# Patient Record
Sex: Female | Born: 1935 | Race: Black or African American | Hispanic: No | Smoking: Never smoker
Health system: Southern US, Community
[De-identification: ages and names within clinical notes are randomized; demographics above are authoritative.]

## PROBLEM LIST (undated history)

## (undated) DIAGNOSIS — I10 Essential (primary) hypertension: Secondary | ICD-10-CM

## (undated) DIAGNOSIS — Z433 Encounter for attention to colostomy: Secondary | ICD-10-CM

## (undated) DIAGNOSIS — I209 Angina pectoris, unspecified: Secondary | ICD-10-CM

## (undated) DIAGNOSIS — E039 Hypothyroidism, unspecified: Secondary | ICD-10-CM

## (undated) DIAGNOSIS — I442 Atrioventricular block, complete: Secondary | ICD-10-CM

## (undated) DIAGNOSIS — J189 Pneumonia, unspecified organism: Secondary | ICD-10-CM

## (undated) DIAGNOSIS — J45909 Unspecified asthma, uncomplicated: Secondary | ICD-10-CM

## (undated) DIAGNOSIS — J449 Chronic obstructive pulmonary disease, unspecified: Secondary | ICD-10-CM

## (undated) DIAGNOSIS — N289 Disorder of kidney and ureter, unspecified: Secondary | ICD-10-CM

## (undated) DIAGNOSIS — M1990 Unspecified osteoarthritis, unspecified site: Secondary | ICD-10-CM

## (undated) DIAGNOSIS — I2699 Other pulmonary embolism without acute cor pulmonale: Secondary | ICD-10-CM

## (undated) DIAGNOSIS — C189 Malignant neoplasm of colon, unspecified: Secondary | ICD-10-CM

## (undated) DIAGNOSIS — Z95810 Presence of automatic (implantable) cardiac defibrillator: Secondary | ICD-10-CM

## (undated) DIAGNOSIS — R011 Cardiac murmur, unspecified: Secondary | ICD-10-CM

## (undated) DIAGNOSIS — E785 Hyperlipidemia, unspecified: Secondary | ICD-10-CM

## (undated) DIAGNOSIS — E119 Type 2 diabetes mellitus without complications: Secondary | ICD-10-CM

## (undated) DIAGNOSIS — Z9289 Personal history of other medical treatment: Secondary | ICD-10-CM

## (undated) DIAGNOSIS — I509 Heart failure, unspecified: Secondary | ICD-10-CM

## (undated) HISTORY — PX: DILATION AND CURETTAGE OF UTERUS: SHX78

## (undated) HISTORY — DX: Atrioventricular block, complete: I44.2

## (undated) HISTORY — PX: FRACTURE SURGERY: SHX138

## (undated) HISTORY — PX: TUBAL LIGATION: SHX77

## (undated) HISTORY — PX: CATARACT EXTRACTION W/ INTRAOCULAR LENS  IMPLANT, BILATERAL: SHX1307

## (undated) HISTORY — PX: ABDOMINAL HYSTERECTOMY: SHX81

## (undated) HISTORY — PX: ANKLE FRACTURE SURGERY: SHX122

## (undated) HISTORY — PX: CHOLECYSTECTOMY: SHX55

---

## 1998-10-09 ENCOUNTER — Emergency Department (HOSPITAL_COMMUNITY): Admission: EM | Admit: 1998-10-09 | Discharge: 1998-10-09 | Payer: Self-pay | Admitting: Emergency Medicine

## 1999-01-12 ENCOUNTER — Other Ambulatory Visit: Admission: RE | Admit: 1999-01-12 | Discharge: 1999-01-12 | Payer: Self-pay | Admitting: Internal Medicine

## 2000-07-02 ENCOUNTER — Emergency Department (HOSPITAL_COMMUNITY): Admission: EM | Admit: 2000-07-02 | Discharge: 2000-07-02 | Payer: Self-pay | Admitting: Emergency Medicine

## 2000-07-02 ENCOUNTER — Encounter: Payer: Self-pay | Admitting: Emergency Medicine

## 2000-07-10 ENCOUNTER — Emergency Department (HOSPITAL_COMMUNITY): Admission: EM | Admit: 2000-07-10 | Discharge: 2000-07-10 | Payer: Self-pay | Admitting: Emergency Medicine

## 2000-12-03 ENCOUNTER — Emergency Department (HOSPITAL_COMMUNITY): Admission: EM | Admit: 2000-12-03 | Discharge: 2000-12-04 | Payer: Self-pay | Admitting: Emergency Medicine

## 2000-12-04 ENCOUNTER — Encounter: Payer: Self-pay | Admitting: Emergency Medicine

## 2000-12-05 ENCOUNTER — Emergency Department (HOSPITAL_COMMUNITY): Admission: EM | Admit: 2000-12-05 | Discharge: 2000-12-05 | Payer: Self-pay | Admitting: Emergency Medicine

## 2002-01-13 ENCOUNTER — Encounter: Payer: Self-pay | Admitting: Emergency Medicine

## 2002-01-13 ENCOUNTER — Emergency Department (HOSPITAL_COMMUNITY): Admission: EM | Admit: 2002-01-13 | Discharge: 2002-01-13 | Payer: Self-pay | Admitting: Emergency Medicine

## 2002-03-13 ENCOUNTER — Emergency Department (HOSPITAL_COMMUNITY): Admission: EM | Admit: 2002-03-13 | Discharge: 2002-03-13 | Payer: Self-pay | Admitting: Emergency Medicine

## 2002-03-13 ENCOUNTER — Encounter: Payer: Self-pay | Admitting: Emergency Medicine

## 2002-11-06 ENCOUNTER — Emergency Department (HOSPITAL_COMMUNITY): Admission: EM | Admit: 2002-11-06 | Discharge: 2002-11-06 | Payer: Self-pay | Admitting: Emergency Medicine

## 2002-12-14 ENCOUNTER — Encounter: Payer: Self-pay | Admitting: Emergency Medicine

## 2002-12-14 ENCOUNTER — Emergency Department (HOSPITAL_COMMUNITY): Admission: EM | Admit: 2002-12-14 | Discharge: 2002-12-15 | Payer: Self-pay | Admitting: Emergency Medicine

## 2003-12-02 ENCOUNTER — Observation Stay (HOSPITAL_COMMUNITY): Admission: EM | Admit: 2003-12-02 | Discharge: 2003-12-03 | Payer: Self-pay | Admitting: Emergency Medicine

## 2004-07-18 ENCOUNTER — Encounter (INDEPENDENT_AMBULATORY_CARE_PROVIDER_SITE_OTHER): Payer: Self-pay | Admitting: *Deleted

## 2004-07-18 ENCOUNTER — Ambulatory Visit (HOSPITAL_COMMUNITY): Admission: RE | Admit: 2004-07-18 | Discharge: 2004-07-18 | Payer: Self-pay | Admitting: *Deleted

## 2004-08-19 ENCOUNTER — Encounter: Admission: RE | Admit: 2004-08-19 | Discharge: 2004-08-19 | Payer: Self-pay | Admitting: General Surgery

## 2004-08-31 ENCOUNTER — Encounter (INDEPENDENT_AMBULATORY_CARE_PROVIDER_SITE_OTHER): Payer: Self-pay | Admitting: Specialist

## 2004-08-31 ENCOUNTER — Inpatient Hospital Stay (HOSPITAL_COMMUNITY): Admission: RE | Admit: 2004-08-31 | Discharge: 2004-09-01 | Payer: Self-pay | Admitting: General Surgery

## 2004-09-12 ENCOUNTER — Ambulatory Visit: Payer: Self-pay | Admitting: Oncology

## 2004-09-19 ENCOUNTER — Ambulatory Visit (HOSPITAL_COMMUNITY): Admission: RE | Admit: 2004-09-19 | Discharge: 2004-09-19 | Payer: Self-pay | Admitting: General Surgery

## 2004-09-19 ENCOUNTER — Ambulatory Visit (HOSPITAL_BASED_OUTPATIENT_CLINIC_OR_DEPARTMENT_OTHER): Admission: RE | Admit: 2004-09-19 | Discharge: 2004-09-19 | Payer: Self-pay | Admitting: General Surgery

## 2004-10-04 ENCOUNTER — Ambulatory Visit: Admission: RE | Admit: 2004-10-04 | Discharge: 2004-11-18 | Payer: Self-pay | Admitting: *Deleted

## 2004-10-09 ENCOUNTER — Emergency Department (HOSPITAL_COMMUNITY): Admission: EM | Admit: 2004-10-09 | Discharge: 2004-10-10 | Payer: Self-pay | Admitting: Emergency Medicine

## 2004-10-09 HISTORY — PX: ABDOMINOPERINEAL PROCTOCOLECTOMY: SUR8

## 2004-10-09 HISTORY — PX: COLOSTOMY: SHX63

## 2004-11-03 ENCOUNTER — Encounter (INDEPENDENT_AMBULATORY_CARE_PROVIDER_SITE_OTHER): Payer: Self-pay | Admitting: *Deleted

## 2004-11-03 ENCOUNTER — Inpatient Hospital Stay (HOSPITAL_COMMUNITY): Admission: RE | Admit: 2004-11-03 | Discharge: 2004-11-11 | Payer: Self-pay | Admitting: General Surgery

## 2004-12-27 ENCOUNTER — Ambulatory Visit: Payer: Self-pay | Admitting: Oncology

## 2005-01-06 ENCOUNTER — Ambulatory Visit (HOSPITAL_BASED_OUTPATIENT_CLINIC_OR_DEPARTMENT_OTHER): Admission: RE | Admit: 2005-01-06 | Discharge: 2005-01-06 | Payer: Self-pay | Admitting: General Surgery

## 2005-01-15 ENCOUNTER — Emergency Department (HOSPITAL_COMMUNITY): Admission: EM | Admit: 2005-01-15 | Discharge: 2005-01-15 | Payer: Self-pay | Admitting: Emergency Medicine

## 2005-02-24 ENCOUNTER — Emergency Department (HOSPITAL_COMMUNITY): Admission: EM | Admit: 2005-02-24 | Discharge: 2005-02-25 | Payer: Self-pay | Admitting: Emergency Medicine

## 2005-02-25 ENCOUNTER — Ambulatory Visit (HOSPITAL_COMMUNITY): Admission: RE | Admit: 2005-02-25 | Discharge: 2005-02-25 | Payer: Self-pay | Admitting: Emergency Medicine

## 2005-03-13 ENCOUNTER — Ambulatory Visit: Payer: Self-pay | Admitting: Hematology and Oncology

## 2005-07-04 ENCOUNTER — Ambulatory Visit: Payer: Self-pay | Admitting: Hematology and Oncology

## 2005-07-14 ENCOUNTER — Ambulatory Visit (HOSPITAL_COMMUNITY): Admission: RE | Admit: 2005-07-14 | Discharge: 2005-07-14 | Payer: Self-pay | Admitting: *Deleted

## 2005-10-09 HISTORY — PX: LYSIS OF ADHESION: SHX5961

## 2005-10-09 HISTORY — PX: PARASTOMAL HERNIA REPAIR: SHX2162

## 2005-10-16 ENCOUNTER — Ambulatory Visit: Payer: Self-pay | Admitting: Hematology and Oncology

## 2005-10-19 ENCOUNTER — Ambulatory Visit (HOSPITAL_COMMUNITY): Admission: RE | Admit: 2005-10-19 | Discharge: 2005-10-19 | Payer: Self-pay | Admitting: Hematology and Oncology

## 2005-12-17 ENCOUNTER — Emergency Department (HOSPITAL_COMMUNITY): Admission: EM | Admit: 2005-12-17 | Discharge: 2005-12-17 | Payer: Self-pay | Admitting: *Deleted

## 2006-03-20 ENCOUNTER — Ambulatory Visit: Payer: Self-pay | Admitting: Hematology and Oncology

## 2006-03-22 LAB — CBC WITH DIFFERENTIAL/PLATELET
Basophils Absolute: 0 10*3/uL (ref 0.0–0.1)
Eosinophils Absolute: 0.1 10*3/uL (ref 0.0–0.5)
HCT: 34.8 % (ref 34.8–46.6)
HGB: 11.6 g/dL (ref 11.6–15.9)
MCV: 86.6 fL (ref 81.0–101.0)
MONO%: 9.5 % (ref 0.0–13.0)
NEUT#: 3.5 10*3/uL (ref 1.5–6.5)
Platelets: 289 10*3/uL (ref 145–400)
RDW: 13.8 % (ref 11.3–14.5)

## 2006-03-22 LAB — COMPREHENSIVE METABOLIC PANEL
Albumin: 4.2 g/dL (ref 3.5–5.2)
Alkaline Phosphatase: 65 U/L (ref 39–117)
BUN: 12 mg/dL (ref 6–23)
Calcium: 9.5 mg/dL (ref 8.4–10.5)
Glucose, Bld: 154 mg/dL — ABNORMAL HIGH (ref 70–99)
Potassium: 4.1 mEq/L (ref 3.5–5.3)

## 2006-05-13 ENCOUNTER — Inpatient Hospital Stay (HOSPITAL_COMMUNITY): Admission: EM | Admit: 2006-05-13 | Discharge: 2006-05-30 | Payer: Self-pay | Admitting: Emergency Medicine

## 2006-10-15 ENCOUNTER — Ambulatory Visit: Payer: Self-pay | Admitting: Hematology and Oncology

## 2006-10-18 LAB — CBC WITH DIFFERENTIAL/PLATELET
Eosinophils Absolute: 0.1 10*3/uL (ref 0.0–0.5)
MONO#: 0.7 10*3/uL (ref 0.1–0.9)
NEUT#: 3.1 10*3/uL (ref 1.5–6.5)
Platelets: 313 10*3/uL (ref 145–400)
RBC: 3.88 10*6/uL (ref 3.70–5.32)
RDW: 12 % (ref 11.3–14.5)
WBC: 6.5 10*3/uL (ref 3.9–10.0)
lymph#: 2.6 10*3/uL (ref 0.9–3.3)

## 2006-10-18 LAB — COMPREHENSIVE METABOLIC PANEL
ALT: 37 U/L — ABNORMAL HIGH (ref 0–35)
AST: 27 U/L (ref 0–37)
Alkaline Phosphatase: 81 U/L (ref 39–117)
Creatinine, Ser: 0.77 mg/dL (ref 0.40–1.20)
Sodium: 142 mEq/L (ref 135–145)
Total Bilirubin: 0.2 mg/dL — ABNORMAL LOW (ref 0.3–1.2)
Total Protein: 7.8 g/dL (ref 6.0–8.3)

## 2006-10-22 ENCOUNTER — Ambulatory Visit (HOSPITAL_COMMUNITY): Admission: RE | Admit: 2006-10-22 | Discharge: 2006-10-22 | Payer: Self-pay | Admitting: Hematology and Oncology

## 2007-01-05 ENCOUNTER — Emergency Department (HOSPITAL_COMMUNITY): Admission: EM | Admit: 2007-01-05 | Discharge: 2007-01-05 | Payer: Self-pay | Admitting: Emergency Medicine

## 2007-03-22 ENCOUNTER — Ambulatory Visit: Payer: Self-pay | Admitting: Hematology and Oncology

## 2007-03-26 LAB — COMPREHENSIVE METABOLIC PANEL
ALT: 30 U/L (ref 0–35)
AST: 24 U/L (ref 0–37)
BUN: 16 mg/dL (ref 6–23)
CO2: 23 mEq/L (ref 19–32)
Calcium: 9.6 mg/dL (ref 8.4–10.5)
Chloride: 103 mEq/L (ref 96–112)
Creatinine, Ser: 1.03 mg/dL (ref 0.40–1.20)
Total Bilirubin: 0.2 mg/dL — ABNORMAL LOW (ref 0.3–1.2)

## 2007-03-26 LAB — CBC WITH DIFFERENTIAL/PLATELET
BASO%: 0.4 % (ref 0.0–2.0)
Basophils Absolute: 0 10*3/uL (ref 0.0–0.1)
EOS%: 1.4 % (ref 0.0–7.0)
HCT: 32.2 % — ABNORMAL LOW (ref 34.8–46.6)
HGB: 10.9 g/dL — ABNORMAL LOW (ref 11.6–15.9)
LYMPH%: 39.9 % (ref 14.0–48.0)
MCH: 29.2 pg (ref 26.0–34.0)
MCHC: 33.8 g/dL (ref 32.0–36.0)
NEUT%: 48.5 % (ref 39.6–76.8)
Platelets: 316 10*3/uL (ref 145–400)
lymph#: 3.1 10*3/uL (ref 0.9–3.3)

## 2007-03-26 LAB — IRON AND TIBC
Iron: 49 ug/dL (ref 42–145)
TIBC: 288 ug/dL (ref 250–470)
UIBC: 239 ug/dL

## 2007-03-26 LAB — CEA: CEA: <0.5 ng/mL (ref 0.0–5.0)

## 2007-08-14 ENCOUNTER — Ambulatory Visit (HOSPITAL_COMMUNITY): Admission: RE | Admit: 2007-08-14 | Discharge: 2007-08-14 | Payer: Self-pay | Admitting: *Deleted

## 2007-09-20 ENCOUNTER — Ambulatory Visit: Payer: Self-pay | Admitting: Hematology and Oncology

## 2007-09-24 LAB — COMPREHENSIVE METABOLIC PANEL
ALT: 31 U/L (ref 0–35)
AST: 20 U/L (ref 0–37)
Albumin: 4.1 g/dL (ref 3.5–5.2)
Alkaline Phosphatase: 72 U/L (ref 39–117)
Glucose, Bld: 138 mg/dL — ABNORMAL HIGH (ref 70–99)
Potassium: 4.1 mEq/L (ref 3.5–5.3)
Sodium: 141 mEq/L (ref 135–145)
Total Bilirubin: 0.3 mg/dL (ref 0.3–1.2)
Total Protein: 7.5 g/dL (ref 6.0–8.3)

## 2007-09-24 LAB — CBC WITH DIFFERENTIAL/PLATELET
BASO%: 0.6 % (ref 0.0–2.0)
EOS%: 2.1 % (ref 0.0–7.0)
Eosinophils Absolute: 0.1 10*3/uL (ref 0.0–0.5)
LYMPH%: 33.4 % (ref 14.0–48.0)
MCH: 29.3 pg (ref 26.0–34.0)
MCHC: 34 g/dL (ref 32.0–36.0)
MCV: 86.3 fL (ref 81.0–101.0)
MONO%: 10.2 % (ref 0.0–13.0)
NEUT#: 3.7 10*3/uL (ref 1.5–6.5)
Platelets: 307 10*3/uL (ref 145–400)
RBC: 3.61 10*6/uL — ABNORMAL LOW (ref 3.70–5.32)
RDW: 14 % (ref 11.3–14.5)

## 2007-12-22 ENCOUNTER — Inpatient Hospital Stay (HOSPITAL_COMMUNITY): Admission: EM | Admit: 2007-12-22 | Discharge: 2007-12-26 | Payer: Self-pay | Admitting: Emergency Medicine

## 2008-03-17 ENCOUNTER — Ambulatory Visit: Payer: Self-pay | Admitting: Hematology and Oncology

## 2008-03-20 ENCOUNTER — Ambulatory Visit (HOSPITAL_COMMUNITY): Admission: RE | Admit: 2008-03-20 | Discharge: 2008-03-20 | Payer: Self-pay | Admitting: Hematology and Oncology

## 2008-03-25 LAB — CBC WITH DIFFERENTIAL/PLATELET
Basophils Absolute: 0 10*3/uL (ref 0.0–0.1)
EOS%: 1 % (ref 0.0–7.0)
HCT: 31.5 % — ABNORMAL LOW (ref 34.8–46.6)
HGB: 10.5 g/dL — ABNORMAL LOW (ref 11.6–15.9)
MCH: 28.6 pg (ref 26.0–34.0)
MCHC: 33.3 g/dL (ref 32.0–36.0)
MCV: 85.8 fL (ref 81.0–101.0)
MONO%: 9 % (ref 0.0–13.0)
NEUT%: 51.8 % (ref 39.6–76.8)

## 2008-03-25 LAB — FERRITIN: Ferritin: 139 ng/mL (ref 10–291)

## 2008-03-25 LAB — COMPREHENSIVE METABOLIC PANEL
AST: 20 U/L (ref 0–37)
Alkaline Phosphatase: 76 U/L (ref 39–117)
BUN: 18 mg/dL (ref 6–23)
Calcium: 9.7 mg/dL (ref 8.4–10.5)
Creatinine, Ser: 0.97 mg/dL (ref 0.40–1.20)
Total Bilirubin: 0.2 mg/dL — ABNORMAL LOW (ref 0.3–1.2)

## 2008-03-25 LAB — IRON AND TIBC
Iron: 37 ug/dL — ABNORMAL LOW (ref 42–145)
TIBC: 311 ug/dL (ref 250–470)

## 2008-04-10 ENCOUNTER — Inpatient Hospital Stay (HOSPITAL_COMMUNITY): Admission: EM | Admit: 2008-04-10 | Discharge: 2008-04-13 | Payer: Self-pay | Admitting: Emergency Medicine

## 2008-06-07 ENCOUNTER — Emergency Department (HOSPITAL_COMMUNITY): Admission: EM | Admit: 2008-06-07 | Discharge: 2008-06-07 | Payer: Self-pay | Admitting: Emergency Medicine

## 2008-09-18 ENCOUNTER — Ambulatory Visit: Payer: Self-pay | Admitting: Hematology and Oncology

## 2008-09-22 LAB — CBC WITH DIFFERENTIAL/PLATELET
BASO%: 0.3 % (ref 0.0–2.0)
EOS%: 1 % (ref 0.0–7.0)
HCT: 34 % — ABNORMAL LOW (ref 34.8–46.6)
MCH: 29 pg (ref 26.0–34.0)
MCHC: 33.3 g/dL (ref 32.0–36.0)
MONO#: 0.6 10*3/uL (ref 0.1–0.9)
NEUT%: 54.1 % (ref 39.6–76.8)
RBC: 3.91 10*6/uL (ref 3.70–5.32)
RDW: 13.7 % (ref 11.3–14.5)
WBC: 6.9 10*3/uL (ref 3.9–10.0)
lymph#: 2.5 10*3/uL (ref 0.9–3.3)

## 2008-09-22 LAB — IRON AND TIBC
%SAT: 12 % — ABNORMAL LOW (ref 20–55)
Iron: 36 ug/dL — ABNORMAL LOW (ref 42–145)
UIBC: 258 ug/dL

## 2008-09-22 LAB — COMPREHENSIVE METABOLIC PANEL
ALT: 33 U/L (ref 0–35)
AST: 27 U/L (ref 0–37)
CO2: 26 mEq/L (ref 19–32)
Calcium: 10 mg/dL (ref 8.4–10.5)
Chloride: 101 mEq/L (ref 96–112)
Creatinine, Ser: 1.01 mg/dL (ref 0.40–1.20)
Sodium: 137 mEq/L (ref 135–145)
Total Protein: 7.9 g/dL (ref 6.0–8.3)

## 2008-10-30 ENCOUNTER — Encounter (INDEPENDENT_AMBULATORY_CARE_PROVIDER_SITE_OTHER): Payer: Self-pay | Admitting: *Deleted

## 2008-10-30 ENCOUNTER — Ambulatory Visit (HOSPITAL_COMMUNITY): Admission: RE | Admit: 2008-10-30 | Discharge: 2008-10-30 | Payer: Self-pay | Admitting: *Deleted

## 2009-02-11 ENCOUNTER — Inpatient Hospital Stay (HOSPITAL_COMMUNITY): Admission: EM | Admit: 2009-02-11 | Discharge: 2009-02-19 | Payer: Self-pay | Admitting: Emergency Medicine

## 2009-02-17 ENCOUNTER — Ambulatory Visit: Payer: Self-pay | Admitting: Physical Medicine & Rehabilitation

## 2009-03-16 ENCOUNTER — Ambulatory Visit: Payer: Self-pay | Admitting: Hematology and Oncology

## 2009-04-15 ENCOUNTER — Ambulatory Visit (HOSPITAL_COMMUNITY): Admission: RE | Admit: 2009-04-15 | Discharge: 2009-04-15 | Payer: Self-pay | Admitting: Hematology and Oncology

## 2009-04-15 ENCOUNTER — Ambulatory Visit: Payer: Self-pay | Admitting: Hematology and Oncology

## 2009-04-15 LAB — COMPREHENSIVE METABOLIC PANEL
ALT: 19 U/L (ref 0–35)
Albumin: 4.1 g/dL (ref 3.5–5.2)
CO2: 29 mEq/L (ref 19–32)
Calcium: 10 mg/dL (ref 8.4–10.5)
Chloride: 101 mEq/L (ref 96–112)
Glucose, Bld: 95 mg/dL (ref 70–99)
Sodium: 138 mEq/L (ref 135–145)
Total Bilirubin: 0.6 mg/dL (ref 0.3–1.2)
Total Protein: 8 g/dL (ref 6.0–8.3)

## 2009-04-15 LAB — CEA: CEA: <0.5 ng/mL (ref 0.0–5.0)

## 2009-04-15 LAB — CBC WITH DIFFERENTIAL/PLATELET
BASO%: 0.4 % (ref 0.0–2.0)
Eosinophils Absolute: 0.1 10*3/uL (ref 0.0–0.5)
HCT: 30.5 % — ABNORMAL LOW (ref 34.8–46.6)
LYMPH%: 40 % (ref 14.0–49.7)
MCHC: 33.6 g/dL (ref 31.5–36.0)
MONO#: 0.6 10*3/uL (ref 0.1–0.9)
NEUT#: 2.8 10*3/uL (ref 1.5–6.5)
Platelets: 303 10*3/uL (ref 145–400)
RBC: 3.53 10*6/uL — ABNORMAL LOW (ref 3.70–5.45)
WBC: 5.9 10*3/uL (ref 3.9–10.3)
lymph#: 2.3 10*3/uL (ref 0.9–3.3)

## 2010-01-14 ENCOUNTER — Ambulatory Visit: Payer: Self-pay | Admitting: Hematology and Oncology

## 2010-10-31 ENCOUNTER — Encounter: Payer: Self-pay | Admitting: Hematology and Oncology

## 2010-11-04 ENCOUNTER — Emergency Department (HOSPITAL_COMMUNITY)
Admission: EM | Admit: 2010-11-04 | Discharge: 2010-11-04 | Payer: Self-pay | Source: Home / Self Care | Admitting: Emergency Medicine

## 2010-11-04 LAB — COMPREHENSIVE METABOLIC PANEL
AST: 18 U/L (ref 0–37)
Albumin: 3.8 g/dL (ref 3.5–5.2)
Chloride: 102 mEq/L (ref 96–112)
Creatinine, Ser: 1.03 mg/dL (ref 0.4–1.2)
GFR calc Af Amer: >60 mL/min (ref >60)
Potassium: 4 mEq/L (ref 3.5–5.1)
Sodium: 139 mEq/L (ref 135–145)
Total Bilirubin: 0.4 mg/dL (ref 0.3–1.2)

## 2010-11-04 LAB — CBC
MCH: 29.1 pg (ref 26.0–34.0)
Platelets: 316 10*3/uL (ref 150–400)
RBC: 3.61 MIL/uL — ABNORMAL LOW (ref 3.87–5.11)
WBC: 5.8 10*3/uL (ref 4.0–10.5)

## 2010-11-04 LAB — URINALYSIS, ROUTINE W REFLEX MICROSCOPIC
Hgb urine dipstick: NEGATIVE
Protein, ur: NEGATIVE mg/dL
Urine Glucose, Fasting: NEGATIVE mg/dL

## 2010-11-04 LAB — DIFFERENTIAL
Basophils Absolute: 0 10*3/uL (ref 0.0–0.1)
Basophils Relative: 0 % (ref 0–1)
Eosinophils Absolute: 0.1 10*3/uL (ref 0.0–0.7)
Neutrophils Relative %: 44 % (ref 43–77)

## 2010-11-06 LAB — URINE CULTURE
Colony Count: NO GROWTH
Culture  Setup Time: 201201280121
Culture: NO GROWTH

## 2011-01-17 LAB — BASIC METABOLIC PANEL
BUN: 10 mg/dL (ref 6–23)
BUN: 15 mg/dL (ref 6–23)
CO2: 29 mEq/L (ref 19–32)
Calcium: 8.9 mg/dL (ref 8.4–10.5)
Calcium: 9.2 mg/dL (ref 8.4–10.5)
Chloride: 102 mEq/L (ref 96–112)
Creatinine, Ser: 1.15 mg/dL (ref 0.4–1.2)
GFR calc Af Amer: >60 mL/min (ref >60)
GFR calc non Af Amer: 46 mL/min — ABNORMAL LOW (ref >60)
GFR calc non Af Amer: 55 mL/min — ABNORMAL LOW (ref >60)
Glucose, Bld: 122 mg/dL — ABNORMAL HIGH (ref 70–99)
Glucose, Bld: 126 mg/dL — ABNORMAL HIGH (ref 70–99)
Sodium: 139 mEq/L (ref 135–145)
Sodium: 140 mEq/L (ref 135–145)

## 2011-01-17 LAB — URINALYSIS, MICROSCOPIC ONLY
Bilirubin Urine: NEGATIVE
Glucose, UA: NEGATIVE mg/dL
Ketones, ur: NEGATIVE mg/dL
Protein, ur: 100 mg/dL — AB

## 2011-01-17 LAB — RETICULOCYTES
RBC.: 3.75 MIL/uL — ABNORMAL LOW (ref 3.87–5.11)
Retic Count, Absolute: 52.2 10*3/uL (ref 19.0–186.0)

## 2011-01-17 LAB — COMPREHENSIVE METABOLIC PANEL
ALT: 29 U/L (ref 0–35)
Albumin: 4 g/dL (ref 3.5–5.2)
Alkaline Phosphatase: 73 U/L (ref 39–117)
BUN: 17 mg/dL (ref 6–23)
Calcium: 9.2 mg/dL (ref 8.4–10.5)
Potassium: 4 mEq/L (ref 3.5–5.1)
Sodium: 138 mEq/L (ref 135–145)
Total Protein: 7.8 g/dL (ref 6.0–8.3)

## 2011-01-17 LAB — POCT I-STAT, CHEM 8
Chloride: 103 mEq/L (ref 96–112)
HCT: 34 % — ABNORMAL LOW (ref 36.0–46.0)
Potassium: 3.6 mEq/L (ref 3.5–5.1)

## 2011-01-17 LAB — URINE CULTURE
Colony Count: NO GROWTH
Culture: NO GROWTH
Special Requests: NEGATIVE

## 2011-01-17 LAB — CBC
Hemoglobin: 10.1 g/dL — ABNORMAL LOW (ref 12.0–15.0)
MCHC: 33.5 g/dL (ref 30.0–36.0)
MCHC: 33.8 g/dL (ref 30.0–36.0)
MCHC: 33.8 g/dL (ref 30.0–36.0)
MCHC: 34.2 g/dL (ref 30.0–36.0)
MCV: 88.1 fL (ref 78.0–100.0)
Platelets: 239 10*3/uL (ref 150–400)
Platelets: 247 10*3/uL (ref 150–400)
Platelets: 401 10*3/uL — ABNORMAL HIGH (ref 150–400)
RBC: 3.24 MIL/uL — ABNORMAL LOW (ref 3.87–5.11)
RBC: 3.35 MIL/uL — ABNORMAL LOW (ref 3.87–5.11)
RDW: 13.7 % (ref 11.5–15.5)
RDW: 13.8 % (ref 11.5–15.5)
WBC: 8.9 10*3/uL (ref 4.0–10.5)

## 2011-01-17 LAB — LIPID PANEL
Cholesterol: 139 mg/dL (ref 0–200)
LDL Cholesterol: 91 mg/dL (ref 0–99)
Total CHOL/HDL Ratio: 4.3 RATIO
Triglycerides: 80 mg/dL (ref <150)

## 2011-01-17 LAB — IRON AND TIBC
Iron: 29 ug/dL — ABNORMAL LOW (ref 42–135)
Saturation Ratios: 10 % — ABNORMAL LOW (ref 20–55)
Saturation Ratios: 8 % — ABNORMAL LOW (ref 20–55)
TIBC: 194 ug/dL — ABNORMAL LOW (ref 250–470)
UIBC: 269 ug/dL

## 2011-01-17 LAB — VITAMIN B12
Vitamin B-12: 273 pg/mL (ref 211–911)
Vitamin B-12: 363 pg/mL (ref 211–911)

## 2011-01-17 LAB — DIFFERENTIAL
Basophils Absolute: 0 10*3/uL (ref 0.0–0.1)
Basophils Absolute: 0 10*3/uL (ref 0.0–0.1)
Basophils Relative: 0 % (ref 0–1)
Basophils Relative: 0 % (ref 0–1)
Basophils Relative: 0 % (ref 0–1)
Eosinophils Absolute: 0 10*3/uL (ref 0.0–0.7)
Lymphocytes Relative: 21 % (ref 12–46)
Monocytes Absolute: 1.2 10*3/uL — ABNORMAL HIGH (ref 0.1–1.0)
Monocytes Relative: 13 % — ABNORMAL HIGH (ref 3–12)
Neutro Abs: 4.3 10*3/uL (ref 1.7–7.7)
Neutro Abs: 6.5 10*3/uL (ref 1.7–7.7)
Neutrophils Relative %: 51 % (ref 43–77)
Neutrophils Relative %: 67 % (ref 43–77)

## 2011-01-17 LAB — PROTIME-INR: Prothrombin Time: 13.9 seconds (ref 11.6–15.2)

## 2011-01-17 LAB — FERRITIN: Ferritin: 206 ng/mL (ref 10–291)

## 2011-01-17 LAB — IRON: Iron: 23 ug/dL — ABNORMAL LOW (ref 42–135)

## 2011-01-17 LAB — MAGNESIUM: Magnesium: 1.9 mg/dL (ref 1.5–2.5)

## 2011-01-17 LAB — HEMOGLOBIN AND HEMATOCRIT, BLOOD: Hemoglobin: 8.7 g/dL — ABNORMAL LOW (ref 12.0–15.0)

## 2011-01-17 LAB — CULTURE, BLOOD (ROUTINE X 2)

## 2011-01-17 LAB — PHOSPHORUS: Phosphorus: 4.6 mg/dL (ref 2.3–4.6)

## 2011-01-17 LAB — URINALYSIS, ROUTINE W REFLEX MICROSCOPIC
Bilirubin Urine: NEGATIVE
Ketones, ur: NEGATIVE mg/dL
Nitrite: POSITIVE — AB
Specific Gravity, Urine: 1.02 (ref 1.005–1.030)
Urobilinogen, UA: 0.2 mg/dL (ref 0.0–1.0)

## 2011-01-17 LAB — URINE MICROSCOPIC-ADD ON

## 2011-02-21 NOTE — Op Note (Signed)
NAMEVERENA, [name] NO.:  [id number]   MEDICAL RECORD NO.:  [id number]          PATIENT TYPE:  INP   LOCATION:  4739                         FACILITY:  [facility]   PHYSICIAN:  Jene Every, M.D.    DATE OF BIRTH:  [birth year]   DATE OF PROCEDURE:  DATE OF DISCHARGE:                               OPERATIVE REPORT   PREOPERATIVE DIAGNOSIS:  Trimalleolar left ankle fracture.   POSTOPERATIVE DIAGNOSIS:  Trimalleolar left ankle fracture.   PROCEDURE PERFORMED:  Open reduction and internal fixation, left  trimalleolar ankle fracture.   ANESTHESIA:  General.   SURGEON:  Jene Every, MD   ASSISTANT:  Roma Schanz, PA   BRIEF HISTORY:  This is a 77 year old female who sustained a fracture  dislocation of the ankle, reduced in the emergency room, indicated for  open reduction and internal fixation.  Risks and benefits discussed  including bleeding, infection, damage to the neurovascular structures,  CSF leak, DVT, PE, hardware failure, etc.   TECHNIQUE:  The patient in supine position.  After induction of adequate  general anesthesia, 1 g of Kefzol, left lower extremity was prepped and  draped and exsanguinated in usual sterile fashion.  Thigh tourniquet  inflated to 300 mmHg.  Incision was made over the fibula.  Subcutaneous  tissue was dissected.  Coagulated to achieve hemostasis.  We spared the  peroneal nerves and branches.  Fracture site was identified, was  comminuted.  We irrigated and curetted it, reduced it with a tenaculum,  put a neutralization lag screw from anterior to posterior after over  drilling, insertion of the screw, and compression.  Moderate purchase  was obtained.  Due to her osteoporosis and fracture type, we chose a  fibula plating system from [employer].  We found a contour plate, secured it  to the lateral aspect of the fibula and then secured it with 3 proximal  bicortical screws with appropriate drilling, depth gauge, measured,  insertion of the screws with excellent purchase distally.  The  cancellous screws and locking screws had the appropriate drilling, depth  gauge measured, insertion of screw with excellent purchase.  We felt  this reduced the fracture anatomically.  The syndesmosis was intact.  Attention turned towards the medial malleolus which was reduced  anatomically, made an incision over the medial malleolus.  Subcutaneous  tissue was dissected.  Coagulated to achieve hemostasis.  We carried the  dissection sharply down to the medial malleolus.  Found the fracture  site, curetted with a dental pick, moved any inner periosteum, irrigated  and reduced it with a tenaculum.  With 2 guide pins, overdrilled it,  inserted approximately 13 screws into the medial malleolus with  excellent purchase.  We used a washer over those as well.  Compression  fracture hematoma was noted.  Post ORIF radiographs demonstrated  excellent mortise, this is in AP and lateral plane.  No widening of  syndesmosis.  Stress radiographs were obtained, they were satisfactory.   Next, wound was copiously irrigated, we closed both with subcu 2-0  Vicryl simple sutures.  Skin was reapproximated with staples.  Wound was  dressed sterilely.  Tourniquet had been deflated prior to that an hour  with good revascularization of lower extremity appreciated.   Next, sterile dressing was applied.  Short leg cast was applied in  neutral position.   The patient was extubated without difficulty and transferred to the  recovery room in satisfactory condition.   The patient tolerated the procedure well with no complications.      Jene Every, M.D.  Electronically Signed     JB/MEDQ  D:  02/12/2009  T:  02/13/2009  Job:  [id number]

## 2011-02-21 NOTE — H&P (Signed)
[name], [name]                 ACCOUNT NO.:  [id number]   MEDICAL RECORD NO.:  [id number]          PATIENT TYPE:  INP   LOCATION:  4739                         FACILITY:  [facility]   PHYSICIAN:  Richarda Overlie, MD       DATE OF BIRTH:  November 26, 1933   DATE OF ADMISSION:  02/11/2009  DATE OF DISCHARGE:                              HISTORY & PHYSICAL   PRIMARY CARE PHYSICIAN:  Shelby Curt. Chilton Si, MD   CHIEF COMPLAINT:  Status post fall, ankle pain, and swelling.   HISTORY OF PRESENT ILLNESS:  This is a 77 year old African American  woman with past medical history of hypertension, history of atrial  fibrillation, anemia, and rectal cancer diagnosed in 2006, with  abdominal and perineal resection and adjuvant therapy in 2006, with no  recurrence; presenting with fractured ankle.  She was getting out of a  store today, when her left leg slipped out of her sandal and she fell  breaking her ankle.  She has swelling and pain at the site of the leg.  No other symptoms other than nausea after the pain medications in the  ED.  No chest pain, shortness of breath, diarrhea, constipation, or  fever, chills, or cough.  Orthopedic Service would want Internal  Medicine to admit due to her medical problems.   PAST MEDICAL HISTORY:  1. Hypertension.  2. Rectal cancer as mentioned above.  3. Parastomal hernia repair after small bowel obstruction in 2007.  4. History of AFib.  5. History of asthma.  6. History of anemia.  7. Hyperlipidemia.  8. History of cataract removal.   MEDICATIONS:  1. Benicar 25 - 40 one tablet p.o. daily.  2. Digoxin 250 mg p.o. daily.  3. Cardizem 360 mg SA p.o. daily.  4. Potassium gluconate 550 mg p.o. daily.  5. Iron in the form of iron sulfate 325 mg p.o. daily.  6. Albuterol 1 puff q.4 h. p.r.n. shortness of breath.  7. Crestor 20 mg p.o. daily.  8. Vitamin D 50,000 International Units p.o. q. weekly.   SOCIAL HISTORY:  She is a retired Chief Strategy Officer, lives in  [place]  with her 3 boys.  She is divorced.  No alcohol or tobacco use.   ALLERGIES:  No known drug allergies.   REVIEW OF SYSTEMS:  Only positive for nausea, otherwise per HPI.   PHYSICAL EXAMINATION:  VITAL SIGNS:  Temperature 97.3, pulse 77, blood  pressure 151/62, respiration rate 18, oxygen saturation 96% on room air.  GENERAL:  She is in no acute distress with her left leg in posterior  splint.  CARDIAC:  Regular rate and rhythm.  Systolic ejection murmur 3/6.  No  JVD.  Good peripheral pulse is felt in the right pedal region.  PULMONARY:  Clear to auscultation bilaterally.  GI:  Soft, nontender, nondistended.  Bowel sounds positive.  Colostomy  bag with greenish stool, no blood.  EXTREMITIES:  Posterior splint in the left leg and right leg pitting  edema.  NEUROLOGIC:  Nonfocal.   LABORATORY DATA:  X-ray; trimalleolar fracture, left ankle with lateral  subluxation  of talus.  Chest x-ray, no acute cardiopulmonary process.  White blood count 8.3, hemoglobin 10.9, MCV 87.8, platelets 247 iSTAT.  Sodium 140, potassium 3.6, chloride 103, bicarb 29, BUN 21, creatinine  1.3, and glucose 104.   ASSESSMENT AND PLAN:  1. Left ankle fracture per Orthopedics.  The plan is to take to the      operation room for open reduction and internal fixation, however,      this will happen tomorrow morning.  The patient will be n.p.o.      after midnight.  We will continue pain control with morphine and      nausea control with Zofran.  We will keep leg elevated and we will      held off prophylactic anticoagulation for now, until surgery.  2. Hypertension.  The patient is on Benicar and Cardizem at home.  We      will hold the diuretic for now as the patient is dry and her      creatinine is increased at 1.3.  We will continue Cardizem.  3. History of atrial fibrillation.  This was an isolated episode in      the past.  She is in normal sinus rhythm now.  We will check an      EKG, BNP,  digoxin level.  We will continue Cardizem and digoxin and      admit to Telemetry.  4. Anemia.  This is chronic, likely secondary to her colon cancer.      She is on iron at home.  We will check an anemia panel.  5. History of colon cancer.  This seems to be without recurrence.  The      patient has a colostomy bag, but is otherwise asymptomatic.  6. Hyperlipidemia.  We will check a fasting lipid profile and we will      continue Crestor.  7. Mild hyperglycemia of 104.  We will recheck a CMET.  The patient is      not known diabetic.  8. Anxiety.  The patient remembers that she takes an antianxiety      medication at home p.r.n., but she cannot remember the name.  We      will start on p.r.n. low-dose Ativan  9. Prophylaxis: Protonix, and heparin/Lovenox after the surgery, per      Orthopedics.      Carlus Pavlov, M.D.  Electronically Signed      Richarda Overlie, MD  Electronically Signed    CG/MEDQ  D:  02/11/2009  T:  02/12/2009  Job:  [id number]   cc:   [name], M.D.

## 2011-02-21 NOTE — Op Note (Signed)
NAMEHARBOR, PASTER NO.:  [id number]   MEDICAL RECORD NO.:  [id number]          PATIENT TYPE:  OUT   LOCATION:  ENDO                         FACILITY:  [facility]   PHYSICIAN:  Georgiana Spinner, M.D.    DATE OF BIRTH:  12-29-1933   DATE OF PROCEDURE:  DATE OF DISCHARGE:                               OPERATIVE REPORT   PROCEDURE:  Colonoscopy.   INDICATIONS:  Follow-up of colon cancer.   ANESTHESIA:  Fentanyl 100 mcg, Versed 10 mg.   DESCRIPTION OF PROCEDURE:  With the patient mildly sedated in the  recumbent position, the Pentax videoscopic pediatric colonoscope was  inserted into the ostomy of the left lower quadrant and passed under  direct vision with pressure applied and the patient turned to her right  side.  Subsequently with pullback and pressure., we were finally able to  reach the cecum.  This was difficult to do because of what appears to be  adhesions in the colon, but we reached the cecum as identified by  ileocecal valve and appendiceal orifice, both of which were  photographed.  Of note in the cecum was a fairly flat polyp, elliptical  shaped and approximately 1 cm to 1-1/2 cm and longus length.  This was  photographed and using snare cautery technique on a setting of 20/20  blended current, we removed this polyp.  It fell to the furthest reach  of the cecum, and I had to retrieve it using a biopsy forceps to pull it  into the scope and subsequently suctioned it into the tissue trap.  But  once accomplished, the colonoscope was then slowly withdrawn taking  circumferential views of the colonic mucosa, stopping only to suction  some fecal debris along the way until we reached the end of the ostomy  and which point the endoscope was withdrawn.  The patient's vital signs  and pulse oximeter remained stable.  The patient tolerated the procedure  well without apparent complication.   FINDINGS:  Difficult colonoscopy, but we reached the cecum.   FINDINGS:   Colon polyp of the cecum.   PLAN:  Await biopsy report.  The patient will call me for results and  follow-up with me as needed as an outpatient.           ______________________________  Georgiana Spinner, M.D.     GMO/MEDQ  D:  10/30/2008  T:  10/30/2008  Job:  [id number]   cc:   Lenon Curt Chilton Si, M.D.  Fax: [phone]   Lauretta I. Odogwu, M.D.  Fax: [phone]

## 2011-02-21 NOTE — H&P (Signed)
[name], BEERY NO.:  [id number]   MEDICAL RECORD NO.:  [id number]          PATIENT TYPE:  INP   LOCATION:  1533                         FACILITY:  [facility]   PHYSICIAN:  Sandria Bales. Ezzard Standing, M.D.  DATE OF BIRTH:  12-31-1933   DATE OF ADMISSION:  04/10/2008  DATE OF DISCHARGE:                              HISTORY & PHYSICAL   Date of surgery ??   CHIEF COMPLAINT:  Abdominal distention.   PRIMARY CARE PHYSICIAN:  Lenon Curt. Chilton Si, M.D.   HISTORY OF ILLNESS:  This is a 77 year old black female who sees Dr. Frederik Pear as her primary medical doctor.  Dr. Vicente Serene Odogwu sees her for colon cancer.  Dr. Claud Kelp has  operated on her multiple times in the past.   She had an abdominal perineal resection by Dr. Derrell Lolling for rectal cancer  in January 2006.  Apparently she developed a parastomal hernia and  underwent a repair of this by Dr. Claud Kelp.  II think he used  biologic MTF mesh for this repair in August 2007.   Since that time she has done well.  However, her most recent admission  was from December 21, 2007 to December 26, 2007 for a low-grade partial bowel  obstruction.  Since that time, she has done well.   She had a recent CT scan ordered by Dr. Dalene Carrow.  The CT scan is dated  March 20, 2008.  It shows some subclinical nodules in her lungs.  It  shows no clear or recurrent disease in her abdomen.  She does have a  possible loop of small bowel adjacent to or going up beside the left  lower quadrant colostomy and she has at least 3 enlarged pelvic lymph  nodes along her left and right iliac vessels.  The etiology of the  lymphadenopathy is unclear.  The patient is under the understanding she  has no recurrent disease.   She ate shrimp and rice last evening, got sick and came to the emergency  room brought by her son.  She lives with her 2 sons, [name] and [name],  but they are not here.  They have already gone back home.  Evaluation  here revealed a  partial bowel obstruction on a KUB.  However, she is  passing gas through her colostomy.  She had some nausea with some  reflux, but no obvious vomiting.   As far as abdominal surgery, besides her APR incisional hernia, she has  had a prior mass or cyst removed from her ovary years before her rectal  surgery.   ALLERGIES:  NO KNOWN ALLERGIES.   CURRENT MEDICATIONS:  1. Benicar, dose unknown.  2. Digoxin, dose unknown.  3. Cardizem, dose unknown.  4. Potassium.  5. Iron.   REVIEW OF SYSTEMS:  NEUROLOGIC:  She has never had a seizure or loss of  consciousness.  PULMONARY:  She does have some bronchitis, it seems to be somewhat  seasonal.  She does not smoke  cigarettes.  CARDIAC:  She has been hypertensive for more than 10 years.  She has  also had a history of atrial fibrillation.  I think this why she is on  the digoxin, but she has never had a cardiac catheterization.  Never had  a heart attack that she knows about or chest pain.  She is not being  followed by a cardiologist.  GASTROINTESTINAL:  See history of present illness.  She has never had  any stomach disease, liver disease.  She had a colonoscopy by Dr. Sabino Gasser last year.  UROLOGIC:  No kidney stones or kidney infections.   SOCIAL HISTORY:  She is retired from working in a nursing home.   PHYSICAL EXAMINATION:  VITAL SIGNS:  Temperature is 98.9, blood pressure  183/72,  pulse 75, respirations 18.  GENERAL:  She is a well-nourished, older black female, alert and  cooperative on physical examination.  HEENT:  Unremarkable.  NECK:  Supple without mass or thyromegaly  LUNGS:  Clear to auscultation.  HEART:  Regular rate and rhythm.  I am not sure I hear a real irregular  rate to her at least now.  ABDOMEN:  Moderately distended.  She is a little sore around her  umbilicus, but she has no guarding, no rebound.  She does have present  bowel sounds.  She says she has been passing gas through her colostomy.  She does  have a midline incision and a right paramedian incision.  I can  not feel any mass or hernia around her ostomy.  EXTREMITIES:  She has good strength in all four extremities.  NEUROLOGIC:  Grossly intact.   LABORATORY DATA:  Urinalysis which is negative.  Hemoglobin is 12,  hematocrit 36, white blood count 11,100 with 64% neutrophils.  Sodium  138, potassium 4.1, chloride 99, CO2 28, BUN is 10.  Her liver functions  are all normal and her lipase is 18.   IMPRESSION:  1. Partial bowel obstruction.  There is a mention on the CT scan she      had in June 2009 of a loop of small bowel either adjacent or going      up beside her colostomy.  Whether this is the source of problems is      unclear.  I cannot palpate this on physical exam.  My plan is to admit her, keep her n.p.o., place her on IV fluids and  repeat her x-rays in the morning.  If she is no better, consider CT  scan.  I discussed all this with her.  At this time she is by herself.  1. History of abdominal perineal resection in January 2006 by Dr. Edythe Lynn.  The patient thinks she is free of disease.  However, she      has had some noted iliac adenopathy on CT scans, though the exact      nature of these are unclear.  2. Bilateral large lymph nodes on CT scan earlier this month.  3. Hypertension.  4. Atrial fibrillation.      Sandria Bales. Ezzard Standing, M.D.  Electronically Signed     DHN/MEDQ  D:  04/10/2008  T:  04/11/2008  Job:  [id number]   cc:   Lenon Curt. Chilton Si, M.D.  Fax: [phone]   Lauretta I. Odogwu, M.D.  Fax: [phone]   Georgiana Spinner, M.D.  Fax: [phone]

## 2011-02-21 NOTE — Discharge Summary (Signed)
[name], [name] NO.:  [id number]   MEDICAL RECORD NO.:  [id number]          PATIENT TYPE:  INP   LOCATION:  4739                         FACILITY:  [facility]   PHYSICIAN:  Beckey Rutter, MD  DATE OF BIRTH:  [birth year]   DATE OF ADMISSION:  02/11/2009  DATE OF DISCHARGE:  02/18/2009                               DISCHARGE SUMMARY   please amend this dictation to the previously dictated discahrge  summary/progress note on 02/15/2009.   DISCHARGE DIAGNOSES:  1. Left ankle fracture status post open reduction and internal      fixation.  2. Urosepsis with Klebsiella pneumoniae sensitive to Cipro.  3. Hypertension.  4. Anemia, multifactorial versus acute bleeding/chronic anemia.  5. Dyslipidemia.  6. Atrial fibrillation not in anticoagulation.  She is on aspirin now.  7. History of rectal cancer.  8. Anxiety disorder.   DISCHARGE MEDICATIONS:  1. Benicar 40/25 mg daily.  2. Cardizem LA 360 daily.  3. Digoxin 0.25 mg daily.  4. Potassium gluconate daily.  5. Ferrous sulfate 325 mg p.o. b.i.d.  6. Zocor 20 mg daily.  7. Vitamin D 50,000 international units.  8. Albuterol MDI p.r.n.  9. Cipro 500 mg p.o. b.i.d. for seven more days.  10.Aspirin 325 mg p.o. daily.   DISCHARGE PLAN:  The patient will be discharged today to home as per her  request with home health PT.  She was advised to follow up with Dr.  Shelle Iron with Orthopedics within 1-2 weeks.  She was advised to follow up  with her primary physician for further anemia monitoring as discussed  with her.  She is aware and agreeable to discharge plan.      Beckey Rutter, MD  Electronically Signed     EME/MEDQ  D:  02/18/2009  T:  02/19/2009  Job:  [id number]

## 2011-02-21 NOTE — H&P (Signed)
[name], [name]                 ACCOUNT NO.:  [id number]   MEDICAL RECORD NO.:  [id number]          PATIENT TYPE:  INP   LOCATION:  1314                         FACILITY:  [facility]   PHYSICIAN:  [name], M.D.DATE OF BIRTH:  01-26-1934   DATE OF ADMISSION:  12/21/2007  DATE OF DISCHARGE:                              HISTORY & PHYSICAL   CHIEF COMPLAINT:  Abdominal pain and distention.   HISTORY OF PRESENT ILLNESS:  [name] is a 77 year old African  American female with a previous history of abdominoperineal resection  and history of small bowel obstruction and parastomal hernia repair.  Today she had the onset now about 7 or 8 hours ago of crampy mid  abdominal pain associated with abdominal distention.  This persisted and  worsened and she presented to the [facility] emergency room.  She has  had some nausea but no vomiting.  She has had normal output from her  colostomy today.  She has not noted any particular mass or specific pain  around the colostomy.  No fever or chills.   PAST MEDICAL HISTORY:  Abdominal perineal resection for cancer of the  rectum in January of 2006 with chemoradiation therapy and no apparent  evidence of recurrence.  She had a lysis of adhesions and repair of  parastomal hernia after an episode of bowel obstruction in August of  2007.  Medically she is followed by Dr. Frederik Pear for history of atrial  fibrillation, hypertension, asthma/bronchitis.  She has known gallstones  felt to be asymptomatic.   CURRENT MEDICATIONS:  1. Benicar daily.  2. Digoxin daily.  3. Cardizem LA daily.  4. Crestor daily.  5. KCl daily (all unknown dose).  6. Albuterol.  7. Atrovent inhalers (unknown dose).   ALLERGIES:  No known drug allergies.   SOCIAL HISTORY:  Lives with her husband.  No cigarette or alcohol use.   FAMILY HISTORY:  Positive for hypertension and cancer.   REVIEW OF SYSTEMS:  GENERAL:  No fever, chills, weight change.  HEENT:  No  vision, hearing, swallowing problems.  RESPIRATORY:  Occasional  shortness of breath and wheezing, none currently.  CARDIAC:  Denies  chest pain, palpitations, history of heart disease.  She does have two  pillow orthopnea chronically.  She has had some swelling of her hands  and feet and lower extremities for about a month.  ABDOMEN:  GI as  above.  GU:  No urinary burning or frequency.  MUSCULOSKELETAL:  No  significant joint pain or swelling.  NEUROLOGIC:  No numbness, weakness,  syncope.  NEUROLOGIC:  No history of blood clots or heavy bleeding.   PHYSICAL EXAMINATION:  VITAL SIGNS:  Temperature is 99.1, pulse 88,  respirations 18, blood pressure 159/61.  GENERAL:  Alert, well-developed African American female who appears  uncomfortable.  SKIN:  Warm and dry.  No rash or infection.  HEENT:  No palpable masses or thyromegaly.  Sclerae nonicteric.  Oropharynx clear.  LYMPH NODES:  No cervical, supraclavicular, inguinal nodes palpable.  LUNGS:  Clear.  No increased work of breathing.  CARDIAC:  There is a somewhat harsh 3/6 systolic murmur.  Regular rate  and rhythm.  1+ to 2+ peripheral edema extremities.  Peripheral pulses  intact.  ABDOMEN:  Moderately distended.  Well-healed midline and paramedian  incisions.  There is moderate diffuse tenderness.  No palpable masses or  organomegaly.  Healthy-appearing stoma in the left lower quadrant.  No  palpable hernia.  EXTREMITIES: 1-2+ edema.  No joint swelling or deformity.  NEUROLOGIC:  Alert, fully oriented.  Motor exams grossly normal.   LABORATORY:  White count 9.7, hemoglobin 11.6.  Urinalysis unremarkable.  Electrolytes, LFTs, glucose all within normal limits.  Lipase normal.  Acute abdominal series, chest x-ray is remarkable.  Abdomen shows single  dilated loop of small bowel in the left upper quadrant consistent with  small bowel obstruction.   ASSESSMENT AND PLAN:  Apparent small bowel obstruction in a patient with  history  of APR and history of lysis of adhesions for bowel obstruction  and repair of parastomal hernia in 2007, possibly secondary to adhesions  or nonpalpable parastomal hernia.  The patient is being treated with NG  suction, IV fluids, pain medication and will be admitted.  Will obtain a  CT scan of the abdomen and pelvis to evaluate for possible hernia.      [name]. [name], M.D.  Electronically Signed     BTH/MEDQ  D:  12/22/2007  T:  12/22/2007  Job:  [id number]

## 2011-02-21 NOTE — H&P (Signed)
[name], [name] NO.:  [id number]   MEDICAL RECORD NO.:  [id number]          PATIENT TYPE:  INP   LOCATION:  4739                         FACILITY:  [facility]   PHYSICIAN:  Jene Every, M.D.    DATE OF BIRTH:  1934/01/18   DATE OF ADMISSION:  02/11/2009  DATE OF DISCHARGE:                              HISTORY & PHYSICAL   HISTORY OF PRESENT ILLNESS:  This is a 77 year old female who fell today  tripped at home, complained of ankle pain, and presented to the  emergency room with some swelling deformity of the left ankle.  She did  not hit her head or lost consciousness.   PAST MEDICAL HISTORY:  1. Asthma.  2. AFib.  3. Cancer of the rectum.  4. Colon cancer.  5. Colostomy.  6. Hypertension.   FAMILY HISTORY:  Coronary artery disease, cancer, hypertension, and  stroke.   MEDICATIONS:  Benicar, Digoxin, Cardizem, potassium chloride, iron,  albuterol.   PHYSICAL EXAMINATION:  VITAL SIGNS:  BP 161/62, pulse 77 and regular,  respiration 18.  HEENT:  Within normal limits.  COR:  Regular rhythm.  PULMONARY:  Clear to auscultation.  ABDOMEN:  Soft, nontender, positive bowel sounds.  Pelvis stable.  EXTREMITIES:  Left leg, soft tissue swelling in the ankle.  Skin is  intact, 1+ dorsalis pedis, posterior tibial pulse.  No tenting the skin  with slight trauma to the medial malleolus.  No DVT.  Able to dorsi flex  and plantar flex the foot knee and hip exam is unremarkable.   LABORATORY DATA:  Radiographs of the ankle demonstrate a bimalleolar  fracture with obstruction of the ankle mortise level was placed in the  talar dome, oblique fracture of the fibula.  Also posterior malleolus  fracture as well.   Labs are pending.   IMPRESSION:  1. Fracture, subluxation of the ankle, trimalleolar closed.  2. Multiple comorbidities including asthma, atrial fibrillation,      rectal cancer, colon cancer, colostomy, and hypertension.   PLAN:  Form a closed reduction  attempt to improve the overall alignment  in the mortise.  She was admit for edema control for a delayed ORIF of  the ankle, preoperative clearance, we discussed risks and benefits  including bleeding, infection, DVT, PE, malunion, nonunion, etc.      Jene Every, M.D.  Electronically Signed     JB/MEDQ  D:  02/11/2009  T:  02/12/2009  Job:  [id number]

## 2011-02-21 NOTE — Op Note (Signed)
NAMEALIXANDREA, MILLESON NO.:  [id number]   MEDICAL RECORD NO.:  [id number]          PATIENT TYPE:  AMB   LOCATION:  ENDO                         FACILITY:  [facility]   PHYSICIAN:  Georgiana Spinner, M.D.    DATE OF BIRTH:  1934/04/03   DATE OF PROCEDURE:  08/14/2007  DATE OF DISCHARGE:                               OPERATIVE REPORT   PROCEDURE:  Colonoscopy.   INDICATIONS:  Colon cancer follow-up.   ANESTHESIA:  Demerol 70 mg, Versed 8 mg.   INDICATIONS:  The patient had resection of colon cancer.  Colonoscopy is  performed through colostomy to follow-up.   With the patient mildly sedated in the recumbent position, the Pentax  videoscopic colonoscope was inserted into the colostomy and passed under  direct vision with pressure applied to reach the cecum identified by  ileocecal valve and the base of the cecum, both of which were  photographed.  From this point, the colonoscope was slowly withdrawn  taking circumferential views of colonic mucosa, stopping only as we  withdrew from the ostomy.  The patient's vital signs and pulse oximeter  remained stable.  The patient tolerated the procedure well without  apparent complications.   FINDINGS:  Negative examination.   PLAN:  Consider repeat examination possibly in 2 years.           ______________________________  Georgiana Spinner, M.D.     GMO/MEDQ  D:  08/14/2007  T:  08/14/2007  Job:  [id number]   cc:   [facility]

## 2011-02-21 NOTE — Group Therapy Note (Signed)
[name], [name] NO.:  [id number]   MEDICAL RECORD NO.:  [id number]          PATIENT TYPE:  INP   LOCATION:  4739                         FACILITY:  [facility]   PHYSICIAN:  Ruthy Dick, MD    DATE OF BIRTH:  Aug 13, 1934                                 PROGRESS NOTE   PROBLEM LIST TO DATE:  1. Left ankle fracture status post open reduction internal fixation.  2. Urosepsis with Klebsiella pneumonia isolated, sensitive to Cipro.  3. Hypertension.  4. Anemia, multifactorial.  Postoperative bleed versus chronic anemia.  5. Dyslipidemia.  6. History of atrial fibrillation not on anticoagulation and started      on aspirin.  7. A history of rectal cancer.  8. Anxiety disorder.   CONSULTS DURING THIS ADMISSION:  Orthopedic consult for left ankle fracture.   PROCEDURES DONE DURING THIS ADMISSION:  Open reduction and internal fixation.   HOSPITAL COURSE:  1. Left ankle fracture.  The patient had a procedure done, and is      presently undergoing physical therapy.  Progress is slow, and the      plan is to place patient in the skilled nursing facility.  Will      place consult with social workers today.  2. Urosepsis Klebsiella pneumonia isolated sensitive to Cipro.  The      patient had fever last night, and we will try to discharge her      after she has been fever free for over 24-48 hours.  3. Anemia, as noted already multifactorial.  Will do some basic workup      and get guaiac.  We may transfuse if her hemoglobin goes below 8.  4. Dyslipidemia, stable.  5. History of atrial fibrillation.  According to patient this was an      episode of atrial fibrillation that happened postop, and since then      she has not had any more and most likely because of this she is not      on any anticoagulation.  We will start the patient on aspirin.  6. History of rectal cancer relatively stable at this time.  7. Anxiety disorder, stable.   VITALS:  T-max 101.6, T current  99.0, pulse 84, respirations 20, blood  pressure 144/70, saturating 98% on room air.  CHEST:  Clear to auscultation bilaterally.  ABDOMEN:  Soft, nontender.  EXTREMITIES:  No clubbing, no cyanosis, no edema.  CARDIOVASCULAR:  First and second heart sounds only.  CENTRAL NERVOUS SYSTEM:  Nonfocal.   Presently the patient is on the following medications:   1. Aspirin increased to 325 mg daily.  2. Digoxin 0.25 mg p.o. daily.  3. Cardizem 360 mg p.o. daily.  4. Protonix 40 mg p.o. daily.  5. Zosyn which will now be changed to Cipro.  6. Crestor.  7. Tylenol p.r.n.  8. Albuterol.  9. Lortab.  10.Robaxin p.r.n.  11.Ativan.  12.Morphine p.r.n.  13.Zofran.      Ruthy Dick, MD  Electronically Signed     GU/MEDQ  D:  02/15/2009  T:  02/15/2009  Job:  [id number] 

## 2011-02-24 NOTE — Consult Note (Signed)
[name], [name]                 ACCOUNT NO.:  [id number]   MEDICAL RECORD NO.:  [id number]          PATIENT TYPE:  INP   LOCATION:  1616                         FACILITY:  [facility]   PHYSICIAN:  Mobolaji B. Bakare, M.D.DATE OF BIRTH:  1933-11-17   DATE OF CONSULTATION:  05/14/2006  DATE OF DISCHARGE:                                   CONSULTATION   REASON FOR CONSULTATION:  Medical management.   HISTORY OF PRESENT ILLNESS:  [name] is a pleasant 77 year old African-  American female who was admitted on May 13, 2006 for a partial small bowel  obstruction, possibly secondary to parastomal hernia.  The patient had an  abdominoperitoneal resection in 2005 for rectal carcinoma. She has undergone  chemotherapy.  She currently has an NG tube in place, which is draining.  The patient is NPO.  [facility] is being asked to manage medical problems  which include paroxysmal atrial fibrillation, hypertension.   PAST MEDICAL HISTORY:  1. Paroxysmal atrial fibrillation.  The patient is currently in sinus      rhythm.  She is not on chronic anticoagulation.  2. Hypertension.  3. Asthma.  She uses a bronchodilator p.r.n.  4. Rectal carcinoma.  She is status post surgery and chemotherapy.   PAST SURGICAL HISTORY:  1. Abdominoperitoneal resection for rectal cancer in 2005.  2. Total abdominal hysterectomy.   REVIEW OF SYSTEMS:  The patient denies chest pain, shortness of breath.  Abdominal pain has resolved.  No vomiting.  She denies urinary frequency,  urgency or dysuria.  No headaches, fever or chills.   CURRENT MEDICATIONS:  1. Digoxin 0.125 mg IV daily.  2. Lovenox 40 mg subcutaneous daily.  3. Morphine 2-4 IV p.r.n.   MEDICATIONS PRIOR TO HOSPITALIZATION:  1. Vytorin 10/20 one daily.  2. Digitek 250 mcg p.o. daily.  3. Cardizem LA 240 mg daily.  4. Benicar hydrochlorothiazide 40/25 daily.  5. Lexapro 10 mg daily.  6. Valium p.r.n. for sleep.  7. Bronchodilator inhaler, of which  the patient does not remember the      name.   ALLERGIES:  No known drug allergies.   FAMILY HISTORY:  Significant for hypertension and cancer.  Mother had colon  cancer.  The patient is not aware of the health status of father.  She had  one uncle who died from prostate cancer, one uncle who passed away from  colon cancer, and one aunt had metastatic cancer.  The patient has 3  children.   SOCIAL HISTORY:  She does not drink alcohol.  Does not smoke cigarettes.  The patient is a retired Engineer, mining.  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   PHYSICAL EXAMINATION:  CURRENT VITAL SIGNS:  Temperature has been within  normal limits.  Current temperature 98.8, pulse has ranged between 70s to  the mid 80s, blood pressure has ranged between 119/49 to 144/68.  O2  saturations on room air of 92%.  GENERAL:  The patient is comfortable, not in  respiratory distress.  Normocephalic and atraumatic.  HEENT:  Pupils equal, round and reactive to light.  Extraocular muscle  movements intact.  Mucous membranes moist.  No oral thrush.  NECK:  No elevated JVD.  No carotid bruits.  LUNGS:  Clear clinically to auscultation, except for bibasilar rales.  CVS:  S1 and S2 regular.  Systolic murmur.  No gallop, no rub.  ABDOMEN:  Nondistended, soft, nontender.  Bowel sounds are hypoactive.  No  palpable organomegaly.  Colostomy site looks clean.  Stoma is not  gangrenous.  EXTREMITIES:  No pedal edema or calf tenderness.  Dorsalis pedis pulses  palpable bilaterally.  CNS:  No focal neurological deficits.   LABORATORY DATA:  The laboratory data has been reviewed.  Sodium 141,  potassium 38.7, chloride 101, bicarbonate 33, glucose 114, BUN 12,  creatinine 1.0, calcium 9.1, white cells 6.9, hemoglobin 11.9, hematocrit  5.1, platelets 329.  Urine microscopic positive for nitrite and small  leukocytes.  White blood cells  326, many bacteria.  Liver function tests  showed mildly elevated AST of 42 and bilirubin of 1.4.  Otherwise,  unremarkable.   CT SCAN OF ABDOMEN AND PELVIS:  Reported parastomal hernia with small bowel  loops within each causing small bowel obstruction.  No abdominal mass or  lesions or adenopathy noted.   ELECTROCARDIOGRAM:  Normal sinus rhythm with premature supraventricular  complexes, right bundle branch block.   ASSESSMENT:  1. Partial small bowel obstruction.  2. Hypertension, fairly controlled.  3. Asymptomatic bacteruria.  4. Paroxysmal atrial fibrillation, now in sinus rhythm.  The patient is      not on chronic anticoagulation.  5. History of asthma, which has been inactive.  6. History of rectal cancer, status post surgery and chemotherapy.   RECOMMENDATIONS:  1. Blood pressure control is not optimal at this time.  Will start      clonidine patch 0.1 mg q weekly to better control blood pressure.  This      can be adjusted depending on control.  Oral medications can be      restarted once the patient is able to take p.o.  2. Would continue digoxin IV for rate control.  Rate appears to be      controlled at this point.  IV verapamil could be      added if heart rate becomes uncontrolled.  3. Will follow urine culture.   Thank you for the consult.  We will follow with you.      Mobolaji B. Corky Downs, M.D.  Electronically Signed     MBB/MEDQ  D:  05/14/2006  T:  05/14/2006  Job:  [id number]   cc:   Angelia Mould. Derrell Lolling, M.D.  [address], [address]  [place]  [place] [address]   Lenon Curt. Chilton Si, M.D.  Fax: [phone]

## 2011-07-03 LAB — COMPREHENSIVE METABOLIC PANEL
ALT: 20
ALT: 27
AST: 20
Albumin: 3.2 — ABNORMAL LOW
Alkaline Phosphatase: 54
CO2: 26
Calcium: 8.4
Calcium: 9.3
Chloride: 102
Creatinine, Ser: 0.89
GFR calc Af Amer: >60
GFR calc non Af Amer: >60
Glucose, Bld: 128 — ABNORMAL HIGH
Glucose, Bld: 138 — ABNORMAL HIGH
Potassium: 3.9
Sodium: 138
Sodium: 138
Total Bilirubin: 0.6
Total Protein: 6.5

## 2011-07-03 LAB — BASIC METABOLIC PANEL
CO2: 29
Calcium: 8.5
Chloride: 103
Creatinine, Ser: 0.67
Glucose, Bld: 133 — ABNORMAL HIGH

## 2011-07-03 LAB — LIPASE, BLOOD: Lipase: 24

## 2011-07-03 LAB — CBC
Hemoglobin: 10.2 — ABNORMAL LOW
Hemoglobin: 11.6 — ABNORMAL LOW
MCHC: 33.9
MCHC: 34.4
MCHC: 34.9
MCV: 86.7
MCV: 87.4
Platelets: 277
RBC: 3.9
RDW: 12.9
RDW: 13.4

## 2011-07-03 LAB — URINALYSIS, ROUTINE W REFLEX MICROSCOPIC
Glucose, UA: NEGATIVE
Hgb urine dipstick: NEGATIVE
Ketones, ur: NEGATIVE
Leukocytes, UA: NEGATIVE
Protein, ur: 30 — AB
pH: 5.5

## 2011-07-03 LAB — DIFFERENTIAL
Basophils Absolute: 0
Eosinophils Absolute: 0.1
Lymphs Abs: 3.3
Neutrophils Relative %: 55

## 2011-07-03 LAB — CEA: CEA: <0.5

## 2011-07-03 LAB — DIGOXIN LEVEL: Digoxin Level: 0.9

## 2011-07-06 LAB — BASIC METABOLIC PANEL
BUN: 10
BUN: 7
CO2: 29
Calcium: 8.6
Calcium: 9.9
GFR calc non Af Amer: 59 — ABNORMAL LOW
Glucose, Bld: 104 — ABNORMAL HIGH
Glucose, Bld: 121 — ABNORMAL HIGH
Potassium: 4
Potassium: 4.1
Sodium: 137

## 2011-07-06 LAB — DIFFERENTIAL
Basophils Absolute: 0.1
Eosinophils Absolute: 0.1
Lymphocytes Relative: 28
Lymphs Abs: 2.1
Lymphs Abs: 2.8
Monocytes Absolute: 0.8
Monocytes Relative: 10
Neutro Abs: 6.4
Neutrophils Relative %: 63
Neutrophils Relative %: 64

## 2011-07-06 LAB — CBC
HCT: 32.7 — ABNORMAL LOW
Hemoglobin: 10.8 — ABNORMAL LOW
MCHC: 33.1
MCV: 86.9
Platelets: 327
RBC: 3.76 — ABNORMAL LOW
RDW: 14.3
WBC: 10.1

## 2011-07-06 LAB — HEPATIC FUNCTION PANEL
ALT: 30
AST: 31
Albumin: 4
Alkaline Phosphatase: 61
Indirect Bilirubin: 0.4
Total Protein: 8.2

## 2011-07-06 LAB — URINALYSIS, ROUTINE W REFLEX MICROSCOPIC
Bilirubin Urine: NEGATIVE
Glucose, UA: NEGATIVE
Ketones, ur: NEGATIVE
Leukocytes, UA: NEGATIVE
Nitrite: NEGATIVE
Specific Gravity, Urine: 1.019
pH: 5.5

## 2011-07-06 LAB — URINE MICROSCOPIC-ADD ON

## 2011-07-06 LAB — BUN: BUN: 16

## 2011-07-06 LAB — CREATININE, SERUM: GFR calc non Af Amer: >60

## 2011-10-04 DIAGNOSIS — F419 Anxiety disorder, unspecified: Secondary | ICD-10-CM | POA: Insufficient documentation

## 2012-09-23 HISTORY — PX: ICD IMPLANT: EP1208

## 2012-11-07 DIAGNOSIS — Z950 Presence of cardiac pacemaker: Secondary | ICD-10-CM | POA: Diagnosis present

## 2015-03-01 ENCOUNTER — Encounter (HOSPITAL_COMMUNITY): Payer: Self-pay | Admitting: *Deleted

## 2015-03-01 ENCOUNTER — Inpatient Hospital Stay (HOSPITAL_COMMUNITY): Payer: Medicare HMO

## 2015-03-01 ENCOUNTER — Inpatient Hospital Stay (HOSPITAL_COMMUNITY)
Admission: EM | Admit: 2015-03-01 | Discharge: 2015-03-04 | DRG: 683 | Disposition: A | Discharge status: Skilled Nursing Facility | Payer: Medicare HMO | Attending: Internal Medicine | Admitting: Internal Medicine

## 2015-03-01 DIAGNOSIS — E860 Dehydration: Secondary | ICD-10-CM | POA: Diagnosis present

## 2015-03-01 DIAGNOSIS — J45909 Unspecified asthma, uncomplicated: Secondary | ICD-10-CM | POA: Diagnosis present

## 2015-03-01 DIAGNOSIS — F0390 Unspecified dementia without behavioral disturbance: Secondary | ICD-10-CM | POA: Diagnosis present

## 2015-03-01 DIAGNOSIS — J449 Chronic obstructive pulmonary disease, unspecified: Secondary | ICD-10-CM | POA: Diagnosis present

## 2015-03-01 DIAGNOSIS — E119 Type 2 diabetes mellitus without complications: Secondary | ICD-10-CM | POA: Diagnosis present

## 2015-03-01 DIAGNOSIS — D62 Acute posthemorrhagic anemia: Secondary | ICD-10-CM | POA: Diagnosis present

## 2015-03-01 DIAGNOSIS — Z9049 Acquired absence of other specified parts of digestive tract: Secondary | ICD-10-CM | POA: Diagnosis present

## 2015-03-01 DIAGNOSIS — M3313 Other dermatomyositis without myopathy: Secondary | ICD-10-CM | POA: Diagnosis present

## 2015-03-01 DIAGNOSIS — N179 Acute kidney failure, unspecified: Principal | ICD-10-CM | POA: Diagnosis present

## 2015-03-01 DIAGNOSIS — D649 Anemia, unspecified: Secondary | ICD-10-CM | POA: Diagnosis not present

## 2015-03-01 DIAGNOSIS — I10 Essential (primary) hypertension: Secondary | ICD-10-CM | POA: Diagnosis not present

## 2015-03-01 DIAGNOSIS — N189 Chronic kidney disease, unspecified: Secondary | ICD-10-CM | POA: Diagnosis present

## 2015-03-01 DIAGNOSIS — N182 Chronic kidney disease, stage 2 (mild): Secondary | ICD-10-CM | POA: Diagnosis present

## 2015-03-01 DIAGNOSIS — M1990 Unspecified osteoarthritis, unspecified site: Secondary | ICD-10-CM | POA: Diagnosis present

## 2015-03-01 DIAGNOSIS — Z933 Colostomy status: Secondary | ICD-10-CM | POA: Diagnosis not present

## 2015-03-01 DIAGNOSIS — R040 Epistaxis: Secondary | ICD-10-CM | POA: Diagnosis present

## 2015-03-01 DIAGNOSIS — I4581 Long QT syndrome: Secondary | ICD-10-CM | POA: Diagnosis present

## 2015-03-01 DIAGNOSIS — D6489 Other specified anemias: Secondary | ICD-10-CM

## 2015-03-01 DIAGNOSIS — R509 Fever, unspecified: Secondary | ICD-10-CM | POA: Diagnosis not present

## 2015-03-01 DIAGNOSIS — Z7401 Bed confinement status: Secondary | ICD-10-CM

## 2015-03-01 DIAGNOSIS — M3390 Dermatopolymyositis, unspecified, organ involvement unspecified: Secondary | ICD-10-CM | POA: Diagnosis present

## 2015-03-01 DIAGNOSIS — I509 Heart failure, unspecified: Secondary | ICD-10-CM | POA: Diagnosis present

## 2015-03-01 DIAGNOSIS — Z85038 Personal history of other malignant neoplasm of large intestine: Secondary | ICD-10-CM

## 2015-03-01 DIAGNOSIS — R5383 Other fatigue: Secondary | ICD-10-CM | POA: Diagnosis present

## 2015-03-01 DIAGNOSIS — I129 Hypertensive chronic kidney disease with stage 1 through stage 4 chronic kidney disease, or unspecified chronic kidney disease: Secondary | ICD-10-CM | POA: Diagnosis present

## 2015-03-01 DIAGNOSIS — E8809 Other disorders of plasma-protein metabolism, not elsewhere classified: Secondary | ICD-10-CM | POA: Diagnosis present

## 2015-03-01 DIAGNOSIS — N390 Urinary tract infection, site not specified: Secondary | ICD-10-CM | POA: Diagnosis present

## 2015-03-01 DIAGNOSIS — B965 Pseudomonas (aeruginosa) (mallei) (pseudomallei) as the cause of diseases classified elsewhere: Secondary | ICD-10-CM | POA: Diagnosis present

## 2015-03-01 HISTORY — DX: Unspecified osteoarthritis, unspecified site: M19.90

## 2015-03-01 HISTORY — DX: Essential (primary) hypertension: I10

## 2015-03-01 HISTORY — DX: Hyperlipidemia, unspecified: E78.5

## 2015-03-01 HISTORY — DX: Disorder of kidney and ureter, unspecified: N28.9

## 2015-03-01 HISTORY — DX: Unspecified asthma, uncomplicated: J45.909

## 2015-03-01 HISTORY — DX: Pneumonia, unspecified organism: J18.9

## 2015-03-01 HISTORY — DX: Cardiac murmur, unspecified: R01.1

## 2015-03-01 HISTORY — DX: Hypothyroidism, unspecified: E03.9

## 2015-03-01 HISTORY — DX: Type 2 diabetes mellitus without complications: E11.9

## 2015-03-01 HISTORY — DX: Encounter for attention to colostomy: Z43.3

## 2015-03-01 HISTORY — DX: Chronic obstructive pulmonary disease, unspecified: J44.9

## 2015-03-01 HISTORY — DX: Angina pectoris, unspecified: I20.9

## 2015-03-01 HISTORY — DX: Heart failure, unspecified: I50.9

## 2015-03-01 HISTORY — DX: Other pulmonary embolism without acute cor pulmonale: I26.99

## 2015-03-01 HISTORY — DX: Personal history of other medical treatment: Z92.89

## 2015-03-01 HISTORY — DX: Malignant neoplasm of colon, unspecified: C18.9

## 2015-03-01 LAB — COMPREHENSIVE METABOLIC PANEL
ALT: 11 U/L — ABNORMAL LOW (ref 14–54)
ANION GAP: 10 (ref 5–15)
AST: 19 U/L (ref 15–41)
Albumin: 2.7 g/dL — ABNORMAL LOW (ref 3.5–5.0)
Alkaline Phosphatase: 43 U/L (ref 38–126)
BILIRUBIN TOTAL: 0.1 mg/dL — AB (ref 0.3–1.2)
BUN: 33 mg/dL — AB (ref 6–20)
CHLORIDE: 101 mmol/L (ref 101–111)
CO2: 27 mmol/L (ref 22–32)
Calcium: 9.4 mg/dL (ref 8.9–10.3)
Creatinine, Ser: 1.55 mg/dL — ABNORMAL HIGH (ref 0.44–1.00)
GFR, EST AFRICAN AMERICAN: 36 mL/min — AB (ref >60)
GFR, EST NON AFRICAN AMERICAN: 31 mL/min — AB (ref >60)
Glucose, Bld: 91 mg/dL (ref 65–99)
Potassium: 3.9 mmol/L (ref 3.5–5.1)
Sodium: 138 mmol/L (ref 135–145)
TOTAL PROTEIN: 7.6 g/dL (ref 6.5–8.1)

## 2015-03-01 LAB — PREPARE RBC (CROSSMATCH)

## 2015-03-01 LAB — CBC
HCT: 29.4 % — ABNORMAL LOW (ref 36.0–46.0)
Hemoglobin: 9.3 g/dL — ABNORMAL LOW (ref 12.0–15.0)
MCH: 26.9 pg (ref 26.0–34.0)
MCHC: 31.6 g/dL (ref 30.0–36.0)
MCV: 85 fL (ref 78.0–100.0)
PLATELETS: 420 10*3/uL — AB (ref 150–400)
RBC: 3.46 MIL/uL — ABNORMAL LOW (ref 3.87–5.11)
RDW: 16.5 % — ABNORMAL HIGH (ref 11.5–15.5)
WBC: 7.3 10*3/uL (ref 4.0–10.5)

## 2015-03-01 LAB — CBC WITH DIFFERENTIAL/PLATELET
Basophils Absolute: 0 10*3/uL (ref 0.0–0.1)
Basophils Relative: 0 % (ref 0–1)
EOS ABS: 0 10*3/uL (ref 0.0–0.7)
Eosinophils Relative: 1 % (ref 0–5)
HEMATOCRIT: 23.6 % — AB (ref 36.0–46.0)
Hemoglobin: 7 g/dL — ABNORMAL LOW (ref 12.0–15.0)
Lymphocytes Relative: 28 % (ref 12–46)
Lymphs Abs: 2 10*3/uL (ref 0.7–4.0)
MCH: 25.7 pg — ABNORMAL LOW (ref 26.0–34.0)
MCHC: 29.7 g/dL — ABNORMAL LOW (ref 30.0–36.0)
MCV: 86.8 fL (ref 78.0–100.0)
Monocytes Absolute: 0.7 10*3/uL (ref 0.1–1.0)
Monocytes Relative: 10 % (ref 3–12)
NEUTROS ABS: 4.6 10*3/uL (ref 1.7–7.7)
NEUTROS PCT: 62 % (ref 43–77)
PLATELETS: 495 10*3/uL — AB (ref 150–400)
RBC: 2.72 MIL/uL — AB (ref 3.87–5.11)
RDW: 16.9 % — AB (ref 11.5–15.5)
WBC: 7.4 10*3/uL (ref 4.0–10.5)

## 2015-03-01 LAB — I-STAT CHEM 8, ED
BUN: 35 mg/dL — AB (ref 6–20)
Calcium, Ion: 1.23 mmol/L (ref 1.13–1.30)
Chloride: 99 mmol/L — ABNORMAL LOW (ref 101–111)
Creatinine, Ser: 1.6 mg/dL — ABNORMAL HIGH (ref 0.44–1.00)
Glucose, Bld: 90 mg/dL (ref 65–99)
HEMATOCRIT: 26 % — AB (ref 36.0–46.0)
HEMOGLOBIN: 8.8 g/dL — AB (ref 12.0–15.0)
Potassium: 3.9 mmol/L (ref 3.5–5.1)
Sodium: 140 mmol/L (ref 135–145)
TCO2: 24 mmol/L (ref 0–100)

## 2015-03-01 LAB — GLUCOSE, CAPILLARY: Glucose-Capillary: 101 mg/dL — ABNORMAL HIGH (ref 65–99)

## 2015-03-01 LAB — ABO/RH: ABO/RH(D): B POS

## 2015-03-01 LAB — I-STAT TROPONIN, ED: TROPONIN I, POC: 0.01 ng/mL (ref 0.00–0.08)

## 2015-03-01 LAB — MRSA PCR SCREENING: MRSA by PCR: NEGATIVE

## 2015-03-01 LAB — POC OCCULT BLOOD, ED: Fecal Occult Bld: NEGATIVE

## 2015-03-01 MED ORDER — FERROUS SULFATE 325 (65 FE) MG PO TABS
325.0000 mg | ORAL_TABLET | Freq: Every day | ORAL | Status: DC
  Administered 2015-03-02 – 2015-03-04 (×3): 325 mg via ORAL
  Filled 2015-03-01 (×5): qty 1

## 2015-03-01 MED ORDER — SODIUM CHLORIDE 0.9 % IJ SOLN
3.0000 mL | Freq: Two times a day (BID) | INTRAMUSCULAR | Status: DC
  Administered 2015-03-01 – 2015-03-03 (×2): 3 mL via INTRAVENOUS

## 2015-03-01 MED ORDER — DOCUSATE SODIUM 100 MG PO CAPS
100.0000 mg | ORAL_CAPSULE | Freq: Two times a day (BID) | ORAL | Status: DC
  Administered 2015-03-01 – 2015-03-04 (×5): 100 mg via ORAL
  Filled 2015-03-01 (×7): qty 1

## 2015-03-01 MED ORDER — ONDANSETRON HCL 4 MG/2ML IJ SOLN: 4.0000 mg | Freq: Four times a day (QID) | INTRAMUSCULAR | Status: DC | PRN

## 2015-03-01 MED ORDER — SODIUM CHLORIDE 0.9 % IV SOLN: INTRAVENOUS | Status: AC

## 2015-03-01 MED ORDER — MOMETASONE FURO-FORMOTEROL FUM 200-5 MCG/ACT IN AERO
2.0000 | INHALATION_SPRAY | Freq: Two times a day (BID) | RESPIRATORY_TRACT | Status: DC
  Administered 2015-03-01 – 2015-03-04 (×5): 2 via RESPIRATORY_TRACT
  Filled 2015-03-01: qty 8.8

## 2015-03-01 MED ORDER — ACETAMINOPHEN 650 MG RE SUPP: 650.0000 mg | Freq: Four times a day (QID) | RECTAL | Status: DC | PRN

## 2015-03-01 MED ORDER — HYDROCODONE-ACETAMINOPHEN 5-325 MG PO TABS
1.0000 | ORAL_TABLET | ORAL | Status: DC | PRN
  Administered 2015-03-01 – 2015-03-03 (×6): 1 via ORAL
  Filled 2015-03-01 (×6): qty 1

## 2015-03-01 MED ORDER — VITAMIN D3 25 MCG (1000 UNIT) PO TABS
1000.0000 [IU] | ORAL_TABLET | Freq: Every day | ORAL | Status: DC
  Administered 2015-03-01 – 2015-03-04 (×4): 1000 [IU] via ORAL
  Filled 2015-03-01 (×4): qty 1

## 2015-03-01 MED ORDER — LEVOTHYROXINE SODIUM 50 MCG PO TABS
50.0000 ug | ORAL_TABLET | ORAL | Status: DC
  Administered 2015-03-02 – 2015-03-04 (×3): 50 ug via ORAL
  Filled 2015-03-01 (×5): qty 1

## 2015-03-01 MED ORDER — OXYMETAZOLINE HCL 0.05 % NA SOLN
1.0000 | Freq: Two times a day (BID) | NASAL | Status: DC | PRN
  Filled 2015-03-01: qty 15

## 2015-03-01 MED ORDER — CARVEDILOL 3.125 MG PO TABS
3.1250 mg | ORAL_TABLET | Freq: Two times a day (BID) | ORAL | Status: DC
  Administered 2015-03-01 – 2015-03-04 (×6): 3.125 mg via ORAL
  Filled 2015-03-01 (×9): qty 1

## 2015-03-01 MED ORDER — OMEGA-3-ACID ETHYL ESTERS 1 G PO CAPS
1000.0000 mg | ORAL_CAPSULE | Freq: Every day | ORAL | Status: DC
  Administered 2015-03-01 – 2015-03-04 (×4): 1000 mg via ORAL
  Filled 2015-03-01 (×4): qty 1

## 2015-03-01 MED ORDER — SODIUM CHLORIDE 0.9 % IV BOLUS (SEPSIS)
500.0000 mL | Freq: Once | INTRAVENOUS | Status: AC
  Administered 2015-03-01: 500 mL via INTRAVENOUS

## 2015-03-01 MED ORDER — ENSURE ENLIVE PO LIQD
237.0000 mL | Freq: Two times a day (BID) | ORAL | Status: DC
  Administered 2015-03-02 – 2015-03-04 (×6): 237 mL via ORAL

## 2015-03-01 MED ORDER — PRAVASTATIN SODIUM 20 MG PO TABS
20.0000 mg | ORAL_TABLET | Freq: Every day | ORAL | Status: DC
  Administered 2015-03-01 – 2015-03-04 (×4): 20 mg via ORAL
  Filled 2015-03-01 (×4): qty 1

## 2015-03-01 MED ORDER — ACETAMINOPHEN 325 MG PO TABS: 650.0000 mg | ORAL_TABLET | Freq: Four times a day (QID) | ORAL | Status: DC | PRN

## 2015-03-01 MED ORDER — ONDANSETRON HCL 4 MG PO TABS: 4.0000 mg | ORAL_TABLET | Freq: Four times a day (QID) | ORAL | Status: DC | PRN

## 2015-03-01 MED ORDER — SODIUM CHLORIDE 0.9 % IV SOLN
INTRAVENOUS | Status: DC
  Administered 2015-03-02 – 2015-03-04 (×4): via INTRAVENOUS

## 2015-03-01 MED ORDER — FAMOTIDINE 20 MG PO TABS
20.0000 mg | ORAL_TABLET | Freq: Every day | ORAL | Status: DC
  Administered 2015-03-01 – 2015-03-04 (×4): 20 mg via ORAL
  Filled 2015-03-01 (×4): qty 1

## 2015-03-01 MED ORDER — FLUTICASONE PROPIONATE 50 MCG/ACT NA SUSP
1.0000 | Freq: Every day | NASAL | Status: DC
  Administered 2015-03-03 – 2015-03-04 (×2): 1 via NASAL
  Filled 2015-03-01 (×2): qty 16

## 2015-03-01 MED ORDER — PANTOPRAZOLE SODIUM 40 MG PO TBEC
40.0000 mg | DELAYED_RELEASE_TABLET | Freq: Every day | ORAL | Status: DC
  Administered 2015-03-01 – 2015-03-04 (×4): 40 mg via ORAL
  Filled 2015-03-01 (×4): qty 1

## 2015-03-01 MED ORDER — SODIUM CHLORIDE 0.9 % IV SOLN
10.0000 mL/h | Freq: Once | INTRAVENOUS | Status: AC
  Administered 2015-03-01: 10 mL/h via INTRAVENOUS

## 2015-03-01 MED ORDER — CAPSAICIN 0.075 % EX CREA
TOPICAL_CREAM | Freq: Two times a day (BID) | CUTANEOUS | Status: DC
  Administered 2015-03-01 – 2015-03-03 (×4): via TOPICAL
  Administered 2015-03-03 – 2015-03-04 (×2): 1 via TOPICAL
  Filled 2015-03-01: qty 60

## 2015-03-01 NOTE — ED Notes (Signed)
Pt was brought by EMS from [facility] with hgl 6.6  .  Pt states she had a nose bleed yesterday and Lost "a lot of blood".  Pt vs 190/80, 90 bpm, 98%.

## 2015-03-01 NOTE — H&P (Signed)
[facility] History and Physical                                                                                    [name], is a 81 y.o. female  MRN: [id number]   DOB - 10-11-1933  Admit Date - 03/01/2015  Outpatient Primary MD for the patient is GREEN, Viviann Spare, MD  Referring Physician:  Dr. Darl Householder  Chief Complaint:   Chief Complaint  Patient presents with  . Fatigue     HPI  [name]  is a 81 y.o. female, with a past medical history of COPD, CHF, diabetes, and hypertension. She is bedbound. She was sent to the emergency department today from her skilled nursing facility for a hemoglobin of 6.6. [name] is demented but able to give history. She reports that she had a nosebleed yesterday from her left nare that stopped with the use of an ice pack. Other than that she denies melena, hematochezia, hematemesis, bruising, hematuria. She has had anemia for a long time and is on iron supplementation. Stool was obtained from her colostomy bag by the EDP and was guaiac negative.  She denies any abdominal pain or indigestion. She says she has been eating well lately. She denies abnormal output in her colostomy bag.  She does not take NSAIDs with the exception of diclofenac gel that she has placed on her knees for osteoarthritis.  In the ER [name] workup is noteworthy for a creatinine of 1.6 (baseline 1.0), BUN elevated at 35. On exam she appears somewhat dry.  Skilled nursing facility noted a hemoglobin of 6.6, and in the ER it was 7.0. The last baseline hemoglobin we have was from several years ago and it was 10.5.  Protein is 7.6, albumin is 2.7.  While in the ER she has developed a low grade fever.  Review of Systems   In addition to the HPI above,  No Fever-chills, No Headache, No changes with Vision or hearing, No problems swallowing food or Liquids, No Chest pain, Cough or Shortness of Breath, No Abdominal pain, No Nausea or Vomiting, Bowel movements are regular, No Blood  in stool or Urine, No dysuria, No new skin rashes or bruises, No new joints pains-aches, she reports chronic pain in her knees. She also has a broken left foot. No new weakness, tingling, numbness in any extremity, No recent weight gain or loss, A full 10 point Review of Systems was done, except as stated above, all other Review of Systems were negative.  Past Medical History  Past Medical History  Diagnosis Date  . Hypertension   . Asthma   . Diabetes mellitus without complication   . Cancer   . Renal disorder     dyalisis  . CHF (congestive heart failure)   . Blood transfusion without reported diagnosis   . COPD (chronic obstructive pulmonary disease)   . Arthritis     Past Surgical History  Procedure Laterality Date  . Cholecystectomy    . Colon surgery      cancer  . Fracture surgery      left ankle      Social History History  Substance Use Topics  . Smoking status: Never Smoker   . Smokeless tobacco: Not on file  . Alcohol Use: No   she was a skilled nursing facility. She is dependent for her ADLs.  Family History She denies any colon cancer or GI bleeding in the family.  Prior to Admission medications   Medication Sig Start Date End Date Taking? Authorizing Provider  ADVAIR DISKUS 500-50 MCG/DOSE AEPB Inhale 1 puff into the lungs every 12 (twelve) hours. 12/28/14   Historical Provider, MD  carvedilol (COREG) 3.125 MG tablet Take 3.125 mg by mouth 2 (two) times daily. 12/08/14   Historical Provider, MD  chlorthalidone (HYGROTON) 25 MG tablet Take 12.5 mg by mouth daily. 12/08/14   Historical Provider, MD  furosemide (LASIX) 40 MG tablet Take 40 mg by mouth daily. Take an additional tablet if more than 2 lb weight gain in 24 hrs 01/09/15   Historical Provider, MD  levothyroxine (SYNTHROID, LEVOTHROID) 50 MCG tablet Take 50 mcg by mouth daily. 01/16/15   Historical Provider, MD  lisinopril (PRINIVIL,ZESTRIL) 10 MG tablet Take 10 mg by mouth daily. 01/18/15   Historical  Provider, MD  pravastatin (PRAVACHOL) 40 MG tablet Take 20 mg by mouth daily. 12/14/14   Historical Provider, MD  PROAIR HFA 108 (90 BASE) MCG/ACT inhaler Inhale 2 puffs into the lungs every 6 (six) hours as needed. 12/08/14   Historical Provider, MD  ranitidine (ZANTAC) 150 MG tablet Take 150 mg by mouth every evening. 01/14/15   Historical Provider, MD    No Known Allergies  Physical Exam  Vitals  Blood pressure 143/54, pulse 87, temperature 99.4 F (37.4 C), temperature source Oral, resp. rate 18, SpO2 100 %.   General: Very pleasant, elderly female, mildly demented lying in bed in NAD  Psych:  Normal affect and insight, Not Suicidal or Homicidal, Awake Alert, Oriented X 3.  Neuro:   No acute F.N deficits, ALL C.Nerves Intact  ENT:  Ears and Eyes appear Normal, Conjunctivae clear, PER. Moist oral mucosa without erythema or exudates.  Neck:  Supple, No lymphadenopathy appreciated  Respiratory:  Symmetrical chest wall movement, Good air movement bilaterally, CTAB.  Cardiac:  RRR, No Murmurs, no LE edema noted, no JVD.    Abdomen:  Positive bowel sounds, Soft, Non tender, Non distended,  No masses appreciated. Colostomy in place without erythema or drainage. There is green solid stool in her colostomy bag.  Skin:  No Cyanosis, Normal Skin Turgor, No Skin Rash or Bruise.  Extremities:  Left lower extremity is in a prolonged boot. The patient has symmetric strength in her upper extremities.  Data Review  CBC  Recent Labs Lab 03/01/15 1252 03/01/15 1312  WBC 7.4  --   HGB 7.0* 8.8*  HCT 23.6* 26.0*  PLT 495*  --   MCV 86.8  --   MCH 25.7*  --   MCHC 29.7*  --   RDW 16.9*  --   LYMPHSABS 2.0  --   MONOABS 0.7  --   EOSABS 0.0  --   BASOSABS 0.0  --     Chemistries   Recent Labs Lab 03/01/15 1252 03/01/15 1312  NA 138 140  K 3.9 3.9  CL 101 99*  CO2 27  --   GLUCOSE 91 90  BUN 33* 35*  CREATININE 1.55* 1.60*  CALCIUM 9.4  --   AST 19  --   ALT 11*  --    ALKPHOS 43  --   BILITOT 0.1*  --  Imaging results:   No results found.  My personal review of EKG: Sinus Rhythm.  Prolonged QT.     Assessment & Plan  Principal Problem:   Acute renal failure Active Problems:   Anemia   HTN (hypertension)   DM (dermatomyositis)   Bedridden   Colostomy in place   Prolonged QT interval   Hypoalbuminemia   Fever in adult   Acute Renal Failure We do not have a recent baseline Creatinine - suspect an element of chronic kidney disease I believe she is also dehydrated.  Urinalysis is pending. Will stop lisinopril and lasix.  Give gentle limited IV hydration and recheck bmet in am.  Acute blood loss anemia in the setting of chronic Anemia Sent from SNF for Hgb of 6.6.  I do not believe the drop in Hgb was due solely to mild epistaxis. 2 units of PRBCs ordered by the EDP. Will cycle CBCs q 12 hours.  Stop diclofenac and replace with capsaicin cream. Anemia panel drawn.  Patient also has a Protein - Albumin gap.  Will check SPEP , UPEP.  Hypoalbuminemia See above.  Add Nutrition consultation.  Low grade fever. Check portable cxr and U/A.   Hx of CHF  Holding lasix, aspirin. Due to GI bleed and ARF.  Continue Coreg Will need to restart when appropriate.  Prolonged QT Check Magnesium.  Avoid Zofran and other QT prolonging medications.  HTN Continue Coreg.  Hold Lasix and lisinpril for now. PRN Hydralazine.  COPD Continue Advair Diskus.   Consultants Called:  None  Family Communication:   Attempted to reach the patient's son, Jeanell Sparrow. Unfortunately the number listed on the facesheet is disconnected.  Code Status:  Full code  Condition:  Stable  Potential Disposition: Back to SNF.  When hemoglobin is stable  Time spent in minutes : [address]  PA-C on 03/01/2015 at 6:20 PM Between 7am to 7pm - Pager - [phone] After 7pm go to [email or web address] - password [email or web address] And look for the night coverage person covering me  after hours  [facility]

## 2015-03-01 NOTE — Progress Notes (Signed)
[name] [id number] Admission Data: 03/01/2015 4:49 PM Attending Provider: Janece Canterbury, MD  PCP:No primary care provider on file. Consults/ Treatment Team:    [name] is a 81 y.o. female patient admitted from ED awake, alert  & orientated to self  Full Code, VSS - Blood pressure 134/56, pulse 98, temperature 98.9 F (37.2 C), temperature source Oral, resp. rate 20, SpO2 100 %.,  no c/o shortness of breath, no c/o chest pain, no distress noted.    Allergies:  No Known Allergies   Past Medical History  Diagnosis Date  . Hypertension   . Asthma   . Diabetes mellitus without complication   . Cancer   . Renal disorder     dyalisis  . CHF (congestive heart failure)   . Blood transfusion without reported diagnosis   . COPD (chronic obstructive pulmonary disease)   . Arthritis      Pt orientation to unit, room and routine. Information packet given to patient/family and safety video watched.  Admission INP armband ID verified with patient/family, and in place. SR up x 2, fall risk assessment complete with Patient and family verbalizing understanding of risks associated with falls. Pt verbalizes an understanding of how to use the call bell and to call for help before getting out of bed.  Skin, clean-dry- intact without evidence of bruising, or skin tears.   No evidence of skin break down noted on exam. Brace to Left foot intact, resident states "It's for a sprain". Pt. Refused to have me remove the boot and did not want me to look at the foot. Denies any skin breakdown. Two tiny scars noted to buttocks area.      Will cont to monitor and assist as needed.  Dayle Points, RN 03/01/2015 4:49 PM

## 2015-03-01 NOTE — ED Notes (Signed)
Attempted report 

## 2015-03-01 NOTE — ED Notes (Signed)
Pt has hgl 6.6 per [facility].  Pt states she had a nose bleed early yesterday.  Pt denies any pain or any complaints except feeling cold.

## 2015-03-01 NOTE — ED Provider Notes (Signed)
CSN: [id number]     Arrival date & time 03/01/15  1228 History   First MD Initiated Contact with Patient 03/01/15 1231     Chief Complaint  Patient presents with  . Fatigue     (Consider location/radiation/quality/duration/timing/severity/associated sxs/prior Treatment) The history is provided by the patient and the EMS personnel.  [name] is a 81 y.o. female hx of HTN, DM, CHF, COPD, dementia with anemia. She lives in a nursing home and states that she had nosebleed yesterday. She had a colostomy with dark stool but she doesn't know if this is chronic or not. Denies any abdominal pain or vomiting blood or coughing blood. Hemoglobin was checked and was 6.6 today so sent in for evaluation and possible transfusion. Patient not currently on blood thinners.    Level V caveat- dementia   Past Medical History  Diagnosis Date  . Hypertension   . Asthma   . Diabetes mellitus without complication   . Cancer   . Renal disorder     dyalisis  . CHF (congestive heart failure)   . Blood transfusion without reported diagnosis   . COPD (chronic obstructive pulmonary disease)   . Arthritis    Past Surgical History  Procedure Laterality Date  . Cholecystectomy    . Colon surgery      cancer  . Fracture surgery      left ankle   History reviewed. No pertinent family history. History  Substance Use Topics  . Smoking status: Never Smoker   . Smokeless tobacco: Not on file  . Alcohol Use: No   OB History    No data available     Review of Systems  Unable to perform ROS: Dementia      Allergies  Review of patient's allergies indicates no known allergies.  Home Medications   Prior to Admission medications   Medication Sig Start Date End Date Taking? Authorizing Provider  ADVAIR DISKUS 500-50 MCG/DOSE AEPB Inhale 1 puff into the lungs every 12 (twelve) hours. 12/28/14   Historical Provider, MD  carvedilol (COREG) 3.125 MG tablet Take 3.125 mg by mouth 2 (two) times daily.  12/08/14   Historical Provider, MD  chlorthalidone (HYGROTON) 25 MG tablet Take 12.5 mg by mouth daily. 12/08/14   Historical Provider, MD  furosemide (LASIX) 40 MG tablet Take 40 mg by mouth daily. Take an additional tablet if more than 2 lb weight gain in 24 hrs 01/09/15   Historical Provider, MD  levothyroxine (SYNTHROID, LEVOTHROID) 50 MCG tablet Take 50 mcg by mouth daily. 01/16/15   Historical Provider, MD  lisinopril (PRINIVIL,ZESTRIL) 10 MG tablet Take 10 mg by mouth daily. 01/18/15   Historical Provider, MD  pravastatin (PRAVACHOL) 40 MG tablet Take 20 mg by mouth daily. 12/14/14   Historical Provider, MD  PROAIR HFA 108 (90 BASE) MCG/ACT inhaler Inhale 2 puffs into the lungs every 6 (six) hours as needed. 12/08/14   Historical Provider, MD  ranitidine (ZANTAC) 150 MG tablet Take 150 mg by mouth every evening. 01/14/15   Historical Provider, MD   BP 125/54 mmHg  Pulse 85  Temp(Src) 98.6 F (37 C) (Oral)  Resp 17  SpO2 99% Physical Exam  Constitutional:  Demented, chronically ill   HENT:  Head: Normocephalic.  Mouth/Throat: Oropharynx is clear and moist.  Eyes: EOM are normal. Pupils are equal, round, and reactive to light.  Conjunctiva pale   Neck: Normal range of motion. Neck supple.  Cardiovascular: Normal rate, regular rhythm and normal heart  sounds.   Pulmonary/Chest: Effort normal and breath sounds normal. No respiratory distress. She has no wheezes. She has no rales.  Abdominal: Soft. Bowel sounds are normal. She exhibits no distension. There is no tenderness. There is no rebound.  Colostomy with dark stool.   Musculoskeletal: Normal range of motion. She exhibits no edema or tenderness.  Neurological: She is alert.  Demented, moving all extremities   Skin: Skin is warm and dry.  Psychiatric: She has a normal mood and affect. Her behavior is normal. Judgment and thought content normal.  Nursing note and vitals reviewed.   ED Course  Procedures (including critical care  time)  CRITICAL CARE Performed by: Darl Householder, DAVID   Total critical care time: 30 min   Critical care time was exclusive of separately billable procedures and treating other patients.  Critical care was necessary to treat or prevent imminent or life-threatening deterioration.  Critical care was time spent personally by me on the following activities: development of treatment plan with patient and/or surrogate as well as nursing, discussions with consultants, evaluation of patient's response to treatment, examination of patient, obtaining history from patient or surrogate, ordering and performing treatments and interventions, ordering and review of laboratory studies, ordering and review of radiographic studies, pulse oximetry and re-evaluation of patient's condition.   Labs Review Labs Reviewed  CBC WITH DIFFERENTIAL/PLATELET - Abnormal; Notable for the following:    RBC 2.72 (*)    Hemoglobin 7.0 (*)    HCT 23.6 (*)    MCH 25.7 (*)    MCHC 29.7 (*)    RDW 16.9 (*)    Platelets 495 (*)    All other components within normal limits  COMPREHENSIVE METABOLIC PANEL - Abnormal; Notable for the following:    BUN 33 (*)    Creatinine, Ser 1.55 (*)    Albumin 2.7 (*)    ALT 11 (*)    Total Bilirubin 0.1 (*)    GFR calc non Af Amer 31 (*)    GFR calc Af Amer 36 (*)    All other components within normal limits  I-STAT CHEM 8, ED - Abnormal; Notable for the following:    Chloride 99 (*)    BUN 35 (*)    Creatinine, Ser 1.60 (*)    Hemoglobin 8.8 (*)    HCT 26.0 (*)    All other components within normal limits  I-STAT TROPOININ, ED  POC OCCULT BLOOD, ED  PREPARE RBC (CROSSMATCH)  TYPE AND SCREEN  ABO/RH    Imaging Review No results found.   EKG Interpretation   Date/Time:  Monday Mar 01 2015 12:42:58 EDT Ventricular Rate:  82 PR Interval:  247 QRS Duration: 162 QT Interval:  442 QTC Calculation: 516 R Axis:   75 Text Interpretation:  Sinus rhythm Prolonged PR interval  Probable left  ventricular hypertrophy Prolonged QT interval Bundle branch block worsened  since previous  Confirmed by YAO  MD, DAVID ([id number]) on 03/01/2015 12:48:58  PM      MDM   Final diagnoses:  None   [name] is a 81 y.o. female here with anemia, dark stool in colostomy bag. Will recheck CBC and likely transfuse. No active nose bleed currently, dry blood L nostril.   2:05 PM Hg 7.0. Baseline 10. Ordered 2U PRBC. Cr slightly elevated, given IVF. Will admit.      Wandra Arthurs, MD 03/01/15 1414

## 2015-03-02 DIAGNOSIS — D649 Anemia, unspecified: Secondary | ICD-10-CM

## 2015-03-02 DIAGNOSIS — R509 Fever, unspecified: Secondary | ICD-10-CM

## 2015-03-02 DIAGNOSIS — E8809 Other disorders of plasma-protein metabolism, not elsewhere classified: Secondary | ICD-10-CM

## 2015-03-02 DIAGNOSIS — I10 Essential (primary) hypertension: Secondary | ICD-10-CM

## 2015-03-02 LAB — CBC
HEMATOCRIT: 29.3 % — AB (ref 36.0–46.0)
HEMOGLOBIN: 9.2 g/dL — AB (ref 12.0–15.0)
MCH: 26.7 pg (ref 26.0–34.0)
MCHC: 31.4 g/dL (ref 30.0–36.0)
MCV: 85.2 fL (ref 78.0–100.0)
PLATELETS: 412 10*3/uL — AB (ref 150–400)
RBC: 3.44 MIL/uL — AB (ref 3.87–5.11)
RDW: 16.5 % — ABNORMAL HIGH (ref 11.5–15.5)
WBC: 7.2 10*3/uL (ref 4.0–10.5)

## 2015-03-02 LAB — RETICULOCYTES
RBC.: 3.4 MIL/uL — ABNORMAL LOW (ref 3.87–5.11)
Retic Count, Absolute: 27.2 10*3/uL (ref 19.0–186.0)
Retic Ct Pct: 0.8 % (ref 0.4–3.1)

## 2015-03-02 LAB — URINALYSIS, ROUTINE W REFLEX MICROSCOPIC
BILIRUBIN URINE: NEGATIVE
GLUCOSE, UA: NEGATIVE mg/dL
Ketones, ur: NEGATIVE mg/dL
Nitrite: POSITIVE — AB
Protein, ur: 100 mg/dL — AB
Specific Gravity, Urine: 1.014 (ref 1.005–1.030)
Urobilinogen, UA: 0.2 mg/dL (ref 0.0–1.0)
pH: 6 (ref 5.0–8.0)

## 2015-03-02 LAB — COMPREHENSIVE METABOLIC PANEL
ALT: 9 U/L — ABNORMAL LOW (ref 14–54)
AST: 15 U/L (ref 15–41)
Albumin: 2.7 g/dL — ABNORMAL LOW (ref 3.5–5.0)
Alkaline Phosphatase: 41 U/L (ref 38–126)
Anion gap: 8 (ref 5–15)
BUN: 28 mg/dL — ABNORMAL HIGH (ref 6–20)
CO2: 25 mmol/L (ref 22–32)
Calcium: 9.4 mg/dL (ref 8.9–10.3)
Chloride: 105 mmol/L (ref 101–111)
Creatinine, Ser: 1.29 mg/dL — ABNORMAL HIGH (ref 0.44–1.00)
GFR calc non Af Amer: 39 mL/min — ABNORMAL LOW (ref >60)
GFR, EST AFRICAN AMERICAN: 45 mL/min — AB (ref >60)
GLUCOSE: 80 mg/dL (ref 65–99)
Potassium: 3.9 mmol/L (ref 3.5–5.1)
Sodium: 138 mmol/L (ref 135–145)
TOTAL PROTEIN: 7.4 g/dL (ref 6.5–8.1)
Total Bilirubin: 0.6 mg/dL (ref 0.3–1.2)

## 2015-03-02 LAB — VITAMIN B12: VITAMIN B 12: 340 pg/mL (ref 180–914)

## 2015-03-02 LAB — IRON AND TIBC
IRON: 24 ug/dL — AB (ref 28–170)
Saturation Ratios: 12 % (ref 10.4–31.8)
TIBC: 200 ug/dL — AB (ref 250–450)
UIBC: 176 ug/dL

## 2015-03-02 LAB — PROTIME-INR
INR: 1.19 (ref 0.00–1.49)
Prothrombin Time: 15.3 seconds — ABNORMAL HIGH (ref 11.6–15.2)

## 2015-03-02 LAB — TYPE AND SCREEN
ABO/RH(D): B POS
Antibody Screen: NEGATIVE
UNIT DIVISION: 0
Unit division: 0

## 2015-03-02 LAB — URINE MICROSCOPIC-ADD ON

## 2015-03-02 LAB — MAGNESIUM: Magnesium: 1.8 mg/dL (ref 1.7–2.4)

## 2015-03-02 LAB — GLUCOSE, CAPILLARY
GLUCOSE-CAPILLARY: 101 mg/dL — AB (ref 65–99)
GLUCOSE-CAPILLARY: 58 mg/dL — AB (ref 65–99)
GLUCOSE-CAPILLARY: 100 mg/dL — AB (ref 65–99)
Glucose-Capillary: 75 mg/dL (ref 65–99)
Glucose-Capillary: 99 mg/dL (ref 65–99)

## 2015-03-02 LAB — FERRITIN: FERRITIN: 156 ng/mL (ref 11–307)

## 2015-03-02 LAB — TSH: TSH: 3.221 u[IU]/mL (ref 0.350–4.500)

## 2015-03-02 MED ORDER — FUROSEMIDE 40 MG PO TABS: 40.0000 mg | ORAL_TABLET | ORAL | Status: DC | PRN

## 2015-03-02 MED ORDER — CEFTRIAXONE SODIUM IN DEXTROSE 20 MG/ML IV SOLN
1.0000 g | INTRAVENOUS | Status: DC
  Administered 2015-03-02 – 2015-03-03 (×2): 1 g via INTRAVENOUS
  Filled 2015-03-02 (×3): qty 50

## 2015-03-02 MED ORDER — CAPSAICIN 0.075 % EX CREA: TOPICAL_CREAM | Freq: Two times a day (BID) | CUTANEOUS | Status: DC

## 2015-03-02 MED ORDER — OXYMETAZOLINE HCL 0.05 % NA SOLN: 1.0000 | Freq: Two times a day (BID) | NASAL | Status: DC | PRN

## 2015-03-02 MED ORDER — ENSURE ENLIVE PO LIQD: 237.0000 mL | Freq: Two times a day (BID) | ORAL | Status: DC

## 2015-03-02 MED ORDER — HYDROCODONE-ACETAMINOPHEN 5-325 MG PO TABS: 1.0000 | ORAL_TABLET | ORAL | Status: DC | PRN

## 2015-03-02 MED ORDER — ASPIRIN EC 81 MG PO TBEC: 81.0000 mg | DELAYED_RELEASE_TABLET | ORAL | Status: AC

## 2015-03-02 NOTE — Progress Notes (Signed)
Initial Nutrition Assessment  DOCUMENTATION CODES:  Not applicable  INTERVENTION:  Ensure Enlive (each supplement provides 350kcal and 20 grams of protein)  NUTRITION DIAGNOSIS:  Predicted suboptimal nutrient intake related to lethargy/confusion as evidenced by per patient/family report.   GOAL:  Patient will meet greater than or equal to 90% of their needs   MONITOR:  PO intake, Supplement acceptance, Labs, I & O's, Weight trends, Skin  REASON FOR ASSESSMENT:  Malnutrition Screening Tool, Consult Assessment of nutrition requirement/status  ASSESSMENT: [name] is a 81 y.o. female, with a past medical history of COPD, CHF, diabetes, and hypertension. She is bedbound. She was sent to the emergency department today from her skilled nursing facility for a hemoglobin of 6.6. [name] is demented but able to give history. She reports that she had a nosebleed yesterday from her left nare that stopped with the use of an ice pack. Other than that she denies melena, hematochezia, hematemesis, bruising, hematuria. She has had anemia for a long time and is on iron supplementation. Stool was obtained from her colostomy bag by the EDP and was guaiac negative. She denies any abdominal pain or indigestion. She says she has been eating well lately. She denies abnormal output in her colostomy bag. She does not take NSAIDs with the exception of diclofenac gel that she has placed on her knees for osteoarthritis.   Pt unable to provide hx due to dementia. She reports that her appetite was decreased PTA. However, she is unsure if she lost weight (she reports she "usually one hundred and something"). Wt taken on the bedscale. No wt and ht hx is unavailable.  Meal intake good during hospitalization. Meal completion 75%. She denies any difficulty chewing or swallowing. Pt consumed entire Ensure supplement. RD will continue order to ensure nutritional adequacy.  Nutrition-focused physical exam  reveals no signs of fat and muscle depletion. Labs reviewed. BUN/Creat: 28/1.29.  Height:  Ht Readings from Last 1 Encounters:  No data found for Ht    Weight:  Wt Readings from Last 1 Encounters:  03/02/15 176 lb 14.4 oz (80.241 kg)    Ideal Body Weight:  Unable to assess  Wt Readings from Last 10 Encounters:  03/02/15 176 lb 14.4 oz (80.241 kg)    BMI:  There is no height on file to calculate BMI.  Estimated Nutritional Needs:  Kcal:  1800-2000  Protein:  80-90 grams  Fluid:  1.8-2.0 L  Skin:  Reviewed, no issues  Diet Order:  Diet Heart Room service appropriate?: Yes; Fluid consistency:: Thin Diet - low sodium heart healthy  EDUCATION NEEDS:  No education needs identified at this time   Intake/Output Summary (Last 24 hours) at 03/02/15 1204 Last data filed at 03/02/15 0915  Gross per 24 hour  Intake    840 ml  Output    300 ml  Net    540 ml    Last BM:  03/01/15  Kevonta Phariss A. Jimmye Norman, RD, LDN, CDE Pager: [phone] After hours Pager: [phone]

## 2015-03-02 NOTE — Progress Notes (Signed)
[facility]                                                                              Patient Demographics  [name], is a 81 y.o. female, DOB - 23-Mar-1934, YJE:[id number]  Admit date - 03/01/2015   Admitting Physician Shelby Canterbury, MD  Outpatient Primary MD for the patient is [name], Shelby Spare, MD  LOS - 1   Chief Complaint  Patient presents with  . Fatigue       Brief HPI   Patient is a 81 year old female with COPD, CHF, diabetes, hypertension, bedbound was essentially to ED from skilled nursing facility for hemoglobin of 6.6. Patient was unable to provide any history due to dementia. Patient had nosebleed or day before from her left nare and that stopped with use of an ice pack. She denied melena, hematochezia, hematemesis, bruising, hematuria. She has had anemia for a long time and is on iron supplementation. Stool was obtained from her colostomy bag by the EDP and was guaiac negative. She denied any abdominal pain or indigestion. She had been eating well lately. She denied abnormal output in her colostomy bag. She did not take NSAIDs with the exception of diclofenac gel that she has placed on her knees for osteoarthritis. In the ER, [name] workup was noteworthy for a creatinine of 1.6 (baseline 1.0), BUN elevated at 35. On exam she appeared somewhat dry. Skilled nursing facility noted a hemoglobin of 6.6, and in the ER it was 7.0. The last baseline hemoglobin we have was from several years ago and it was 10.5. Protein is 7.6, albumin is 2.7. While in the ER she has developed a low grade fever.   Assessment & Plan    Principal Problem:   Acute renal failure: Likely worsened due to dehydration and UTI  - possible has underlying chronic kidney disease -  Creatinine has been improving, 1.6 yesterday, 1.29 today, continue gentle hydration, follow BMET in a.m. -Stop NSAIDs   Active Problems:  Acute blood loss anemia in the setting of chronic anemia  likely due to epistaxis - Patient received 2 units of packed RBCs - Hemoglobin improved to 9.2 today, continue close monitoring  Hypoalbuminemia Obtain nutrition consult  Urinary tract infection  No leukocytosis, UA positive for UTI - Place on IV Rocephin, Follow urine culture and sensitivities  Hx of CHF : EF unknown  Holding lasix, aspirin. Due to GI bleed and ARF. Continue Coreg Will need to restart when appropriate.  Prolonged QT  keep K ~4, Mg ~2,  Avoid Zofran and other QT prolonging medications.  HTN Continue Coreg. Hold Lasix and lisinpril for now. PRN Hydralazine.  COPD: Stable, no wheezing  Continue Advair Diskus.  Code Status: Full code   Family Communication: Discussed in detail with the patient, all imaging results, lab results explained to the patient    Disposition Plan: Hopefully to skilled nursing facility tomorrow, PTOT evaluation ordered   Time Spent in minutes 25 minutes  Procedures  None   Consults   None   DVT Prophylaxis  TED hoses  Medications  Scheduled Meds: . sodium chloride   Intravenous STAT  .  capsicum   Topical BID  . carvedilol  3.125 mg Oral BID WC  . cholecalciferol  1,000 Units Oral q1800  . docusate sodium  100 mg Oral BID  . famotidine  20 mg Oral Daily  . feeding supplement (ENSURE ENLIVE)  237 mL Oral BID BM  . ferrous sulfate  325 mg Oral Q breakfast  . fluticasone  1 spray Each Nare Daily  . levothyroxine  50 mcg Oral BH-q7a  . mometasone-formoterol  2 puff Inhalation BID  . omega-3 acid ethyl esters  1,000 mg Oral q1800  . pantoprazole  40 mg Oral Daily  . pravastatin  20 mg Oral q1800  . sodium chloride  3 mL Intravenous Q12H   Continuous Infusions: . sodium chloride 75 mL/hr at 03/02/15 1001   PRN Meds:.acetaminophen **OR** acetaminophen, HYDROcodone-acetaminophen, ondansetron **OR** ondansetron (ZOFRAN) IV, oxymetazoline   Antibiotics   Anti-infectives    None        Subjective:   [name]  was seen and examined today.   no further epistaxis. Patient denies dizziness, chest pain, shortness of breath, abdominal pain, N/V/D/C, new weakness, numbess, tingling. No acute events overnight.    Objective:   Blood pressure 125/82, pulse 93, temperature 98.5 F (36.9 C), temperature source Oral, resp. rate 18, weight 80.241 kg (176 lb 14.4 oz), SpO2 100 %.  Wt Readings from Last 3 Encounters:  03/02/15 80.241 kg (176 lb 14.4 oz)     Intake/Output Summary (Last 24 hours) at 03/02/15 1227 Last data filed at 03/02/15 0915  Gross per 24 hour  Intake    840 ml  Output    300 ml  Net    540 ml    Exam  General: Alert and oriented x 3, NAD  HEENT:  PERRLA, EOMI, Anicteic Sclera, mucous membranes moist.   Neck: Supple, no JVD, no masses  CVS: S1 S2 auscultated, no rubs, murmurs or gallops. Regular rate and rhythm.  Respiratory: Clear to auscultation bilaterally, no wheezing, rales or rhonchi  Abdomen: Soft, nontender, nondistended, + bowel sounds, + colostomy   Ext: no cyanosis clubbing or edema  Neuro: AAOx3, Cr N's II- XII. Strength 5/5 upper and lower extremities bilaterally  Skin: No rashes  Psych: Normal affect and demeanor, alert and oriented x3    Data Review   Micro Results Recent Results (from the past 240 hour(s))  MRSA PCR Screening     Status: None   Collection Time: 03/01/15  4:38 PM  Result Value Ref Range Status   MRSA by PCR NEGATIVE NEGATIVE Final    Comment:        The GeneXpert MRSA Assay (FDA approved for NASAL specimens only), is one component of a comprehensive MRSA colonization surveillance program. It is not intended to diagnose MRSA infection nor to guide or monitor treatment for MRSA infections.     Radiology Reports Dg Chest Port 1 View  03/01/2015   CLINICAL DATA:  Sudden onset of fever today.  EXAM: PORTABLE CHEST - 1 VIEW  COMPARISON:  02/13/2009.  FINDINGS: Cardiac silhouette mildly enlarged. No mediastinal hilar masses.  Left anterior chest wall sequential pacemaker leads project over the right atrium right ventricle.  No lung consolidation or edema. No pleural effusion or pneumothorax.  Bony thorax is demineralized but grossly intact.  IMPRESSION: No acute cardiopulmonary disease.   Electronically Signed   By: Lajean Manes M.D.   On: 03/01/2015 21:20    CBC  Recent Labs Lab 03/01/15 1252 03/01/15 1312 03/01/15  2233 03/02/15 0528  WBC 7.4  --  7.3 7.2  HGB 7.0* 8.8* 9.3* 9.2*  HCT 23.6* 26.0* 29.4* 29.3*  PLT 495*  --  420* 412*  MCV 86.8  --  85.0 85.2  MCH 25.7*  --  26.9 26.7  MCHC 29.7*  --  31.6 31.4  RDW 16.9*  --  16.5* 16.5*  LYMPHSABS 2.0  --   --   --   MONOABS 0.7  --   --   --   EOSABS 0.0  --   --   --   BASOSABS 0.0  --   --   --     Chemistries   Recent Labs Lab 03/01/15 1252 03/01/15 1312 03/02/15 0019 03/02/15 0528  NA 138 140  --  138  K 3.9 3.9  --  3.9  CL 101 99*  --  105  CO2 27  --   --  25  GLUCOSE 91 90  --  80  BUN 33* 35*  --  28*  CREATININE 1.55* 1.60*  --  1.29*  CALCIUM 9.4  --   --  9.4  MG  --   --  1.8  --   AST 19  --   --  15  ALT 11*  --   --  9*  ALKPHOS 43  --   --  41  BILITOT 0.1*  --   --  0.6   ------------------------------------------------------------------------------------------------------------------ CrCl cannot be calculated (Unknown ideal weight.). ------------------------------------------------------------------------------------------------------------------ No results for input(s): HGBA1C in the last 72 hours. ------------------------------------------------------------------------------------------------------------------ No results for input(s): CHOL, HDL, LDLCALC, TRIG, CHOLHDL, LDLDIRECT in the last 72 hours. ------------------------------------------------------------------------------------------------------------------  Recent Labs  03/02/15 0019  TSH 3.221    ------------------------------------------------------------------------------------------------------------------  Recent Labs  03/02/15 0019  VITAMINB12 340  FERRITIN 156  TIBC 200*  IRON 24*  RETICCTPCT 0.8    Coagulation profile  Recent Labs Lab 03/02/15 0019  INR 1.19    No results for input(s): DDIMER in the last 72 hours.  Cardiac Enzymes No results for input(s): CKMB, TROPONINI, MYOGLOBIN in the last 168 hours.  Invalid input(s): CK ------------------------------------------------------------------------------------------------------------------ Invalid input(s): [place]  03/01/15 2131 03/02/15 0754 03/02/15 1210  GLUCAP 101* 75 101*     RAI,RIPUDEEP M.D. [facility] 03/02/2015, 12:27 PM  Pager: [phone]   Between 7am to 7pm - call Pager - [phone]  After 7pm go to [email or web address] - password [email or web address]  Call night coverage person covering after 7pm

## 2015-03-02 NOTE — Progress Notes (Signed)
Utilization Review completed. Leiby Pigeon RN BSN CM 

## 2015-03-02 NOTE — Progress Notes (Signed)
Blood sugar is 58, given snack and drink. Will recheck and continue to monitor.

## 2015-03-02 NOTE — Evaluation (Signed)
Physical Therapy Evaluation Patient Details Name: [name] MRN: [id number] DOB: 09/17/1934 Today's Date: 03/02/2015   History of Present Illness  Patient is a 81 year old female with COPD, CHF, diabetes, hypertension, bedbound was essentially to ED from skilled nursing facility for hemoglobin of 6.6 - Found to have acute renal failure.  Clinical Impression  Pt admitted with the above complications. Pt currently with functional limitations due to the deficits listed below (see PT Problem List). Requires max verbal cues for sequencing of tasks and to stay focused with task at hand. Able to stand x2 with mod assist but only tolerates for several seconds at a time. States she was making progress with PT at SNF. Pt will benefit from skilled PT to increase their independence and safety with mobility to allow discharge to the venue listed below.       Follow Up Recommendations SNF    Equipment Recommendations  None recommended by PT    Recommendations for Other Services       Precautions / Restrictions Precautions Precautions: Fall Restrictions Weight Bearing Restrictions: Yes Other Position/Activity Restrictions: CAM boot for LT foot      Mobility  Bed Mobility Overal bed mobility: Needs Assistance Bed Mobility: Supine to Sit;Sit to Supine     Supine to sit: Min assist Sit to supine: Mod assist;HOB elevated   General bed mobility comments: Min assist for supine>sit transition with truncal support, max VC for hand placement and sequencing. Mod assist for LE support back into bed and to re-position at head of bed.  Transfers Overall transfer level: Needs assistance Equipment used: Rolling walker (2 wheeled) Transfers: Sit to/from Stand Sit to Stand: Mod assist;From elevated surface         General transfer comment: Mod assist for boost to stand from highly elevated bed surface. Practiced x2. Max cues for upright posture, pt remains flexed at hips. Cues for hand placement.  Only tolerated several seconds each attempt.  Ambulation/Gait         Gait velocity: Refused to attempt ambulation      Stairs            Wheelchair Mobility    Modified Rankin (Stroke Patients Only)       Balance Overall balance assessment: Needs assistance;History of Falls Sitting-balance support: No upper extremity supported;Feet supported Sitting balance-Leahy Scale: Fair     Standing balance support: Bilateral upper extremity supported Standing balance-Leahy Scale: Poor                               Pertinent Vitals/Pain Pain Assessment: No/denies pain    Home Living Family/patient expects to be discharged to:: Skilled nursing facility   Available Help at Discharge: [place] Type of Home: [place]           Additional Comments: States she has been working with therapy 2x/week for her Lt ankle.     Prior Function Level of Independence: Needs assistance   Gait / Transfers Assistance Needed: States she ambulates with therapy  ADL's / Homemaking Assistance Needed: Needs assist to bath/dress        Hand Dominance   Dominant Hand: Right    Extremity/Trunk Assessment   Upper Extremity Assessment: Defer to OT evaluation           Lower Extremity Assessment: Generalized weakness (LLE edematous ankle - hx of injury)         Communication  Communication: No difficulties  Cognition Arousal/Alertness: Awake/alert Behavior During Therapy: WFL for tasks assessed/performed Overall Cognitive Status: No family/caregiver present to determine baseline cognitive functioning                      General Comments      Exercises General Exercises - Lower Extremity Ankle Circles/Pumps: AROM;Both;10 reps;Supine      Assessment/Plan    PT Assessment Patient needs continued PT services  PT Diagnosis Difficulty walking;Generalized weakness   PT Problem List Decreased strength;Decreased  activity tolerance;Decreased balance;Decreased range of motion;Decreased mobility;Decreased knowledge of use of DME;Decreased safety awareness  PT Treatment Interventions DME instruction;Gait training;Functional mobility training;Therapeutic activities;Therapeutic exercise;Balance training;Neuromuscular re-education;Patient/family education   PT Goals (Current goals can be found in the Care Plan section) Acute Rehab PT Goals Patient Stated Goal: Go back to rehab PT Goal Formulation: With patient Time For Goal Achievement: 03/16/15 Potential to Achieve Goals: Good    Frequency Min 2X/week   Barriers to discharge        Co-evaluation               End of Session   Activity Tolerance: Patient limited by fatigue Patient left: in bed;with call bell/phone within reach;with bed alarm set Nurse Communication: Mobility status         Time: 1350-1416 PT Time Calculation (min) (ACUTE ONLY): 26 min   Charges:   PT Evaluation $Initial PT Evaluation Tier I: 1 Procedure PT Treatments $Therapeutic Activity: 8-22 mins   PT G CodesEllouise Newer 03/02/2015, 2:43 PM Camille Bal [place], [place]

## 2015-03-03 DIAGNOSIS — Z933 Colostomy status: Secondary | ICD-10-CM

## 2015-03-03 DIAGNOSIS — Z7401 Bed confinement status: Secondary | ICD-10-CM

## 2015-03-03 DIAGNOSIS — M3390 Dermatopolymyositis, unspecified, organ involvement unspecified: Secondary | ICD-10-CM

## 2015-03-03 LAB — UIFE/LIGHT CHAINS/TP QN, 24-HR UR
% BETA, Urine: 31.6 %
ALBUMIN, U: 28.5 %
ALPHA 1 URINE: 9.1 %
ALPHA 2 UR: 24.6 %
Free Kappa/Lambda Ratio: 8.1 (ref 2.04–10.37)
Free Lambda Lt Chains,Ur: 9.38 mg/L — ABNORMAL HIGH (ref 0.24–6.66)
Free Lt Chn Excr Rate: 76 mg/L — ABNORMAL HIGH (ref 1.35–24.19)
GAMMA GLOBULIN URINE: 6.2 %
TOTAL PROTEIN, URINE-UPE24: 94 mg/dL

## 2015-03-03 LAB — GLUCOSE, CAPILLARY
GLUCOSE-CAPILLARY: 65 mg/dL (ref 65–99)
Glucose-Capillary: 71 mg/dL (ref 65–99)
Glucose-Capillary: 129 mg/dL — ABNORMAL HIGH (ref 65–99)
Glucose-Capillary: 86 mg/dL (ref 65–99)

## 2015-03-03 LAB — FOLATE RBC
Folate, Hemolysate: 297 ng/mL
Folate, RBC: 1028 ng/mL (ref >498)
Hematocrit: 28.9 % — ABNORMAL LOW (ref 34.0–46.6)

## 2015-03-03 LAB — CBC
HEMATOCRIT: 31.4 % — AB (ref 36.0–46.0)
Hemoglobin: 9.6 g/dL — ABNORMAL LOW (ref 12.0–15.0)
MCH: 26.4 pg (ref 26.0–34.0)
MCHC: 30.6 g/dL (ref 30.0–36.0)
MCV: 86.3 fL (ref 78.0–100.0)
PLATELETS: 451 10*3/uL — AB (ref 150–400)
RBC: 3.64 MIL/uL — AB (ref 3.87–5.11)
RDW: 16.4 % — ABNORMAL HIGH (ref 11.5–15.5)
WBC: 9 10*3/uL (ref 4.0–10.5)

## 2015-03-03 NOTE — Clinical Social Work Note (Signed)
Clinical Social Work Assessment  Patient Details  Name: [name] MRN: [id number] Date of Birth: 04-29-1934  Date of referral:  03/03/15               Reason for consult:  Facility Placement                Permission sought to share information with:  Family Supports Permission granted to share information::  Yes, Verbal Permission Granted  Name::     [name]  Relationship::  Son  Contact Information:  [phone]  Housing/Transportation Living arrangements for the past 2 months:  [place] of Information:  Patient, Adult Children Patient Interpreter Needed:  None Criminal Activity/Legal Involvement Pertinent to Current Situation/Hospitalization:  No - Comment as needed Significant Relationships:  Adult Children Lives with:  Facility Resident Do you feel safe going back to the place where you live?  Yes Need for family participation in patient care:  Yes (Comment)  Care giving concerns:  No friends and/or family available at bedside, however spoke with patient son over the phone.  Patient happy with patient placement at [facility] and does not express any concerns at this time.  Patient son would like to be called at time of discharge.   Social Worker assessment / plan:  Holiday representative met with patient briefly at bedside and spoke with patient son over the phone to offer support and discuss patient plans at discharge.  Patient and patient son both confirm that patient is from [place] and the plan is to return at discharge.  CSW contacted facility who is in agreement with patient return once insurance authorization is obtained.  Clinical information provided to facility and insurance authorization initiated on 05/24.  CSW remains available to for support and to facilitate patient discharge needs once medically stable.  Employment status:  Retired Nurse, adult PT Recommendations:  [place] / Referral to community resources:  [place]  Patient/Family's Response to care:  Patient family understanding of CSW role and appreciative for support and concern.  Patient son agreeable with patient return to [place].  Patient/Family's Understanding of and Emotional Response to Diagnosis, Current Treatment, and Prognosis:  Patient not completely oriented to time and situation and patient son presents over the phone with possible cognitive delays but acting in the best interest of the patient.  Patient and/or son do not seem to have a realistic view on patient condition and/or need for treatment but willing to go along with MD impressions and suggestions.  Emotional Assessment Appearance:  Appears younger than stated age Attitude/Demeanor/Rapport:  Inconsistent, Unable to Assess Affect (typically observed):  Flat, Unable to Assess Orientation:  Oriented to Self, Oriented to Place Alcohol / Substance use:  Not Applicable Psych involvement (Current and /or in the community):  No (Comment)  Discharge Needs  Concerns to be addressed:  Care Coordination Readmission within the last 30 days:  No Current discharge risk:  None Barriers to Discharge:  Continued Medical Work up, [employer], [place]

## 2015-03-03 NOTE — Progress Notes (Signed)
[facility]                                                                              Patient Demographics  [name], is a 81 y.o. female, DOB - November 12, 1933, BWI:[id number]  Admit date - 03/01/2015   Admitting Physician Janece Canterbury, MD  Outpatient Primary MD for the patient is No primary care provider on file.  LOS - 2   Chief Complaint  Patient presents with  . Fatigue       Brief HPI   Patient is a 81 year old female with COPD, CHF, diabetes, hypertension, bedbound was essentially to ED from skilled nursing facility for hemoglobin of 6.6. Patient was unable to provide any history due to dementia. Patient had nosebleed or day before from her left nare and that stopped with use of an ice pack. She denied melena, hematochezia, hematemesis, bruising, hematuria. She has had anemia for a long time and is on iron supplementation. Stool was obtained from her colostomy bag by the EDP and was guaiac negative. She denied any abdominal pain or indigestion. She had been eating well lately. She denied abnormal output in her colostomy bag. She did not take NSAIDs with the exception of diclofenac gel that she has placed on her knees for osteoarthritis. In the ER, [name] workup was noteworthy for a creatinine of 1.6 (baseline 1.0), BUN elevated at 35. On exam she appeared somewhat dry. Skilled nursing facility noted a hemoglobin of 6.6, and in the ER it was 7.0. The last baseline hemoglobin we have was from several years ago and it was 10.5. Protein is 7.6, albumin is 2.7. While in the ER she has developed a low grade fever.   Assessment & Plan    Principal Problem:   Acute renal failure: Likely worsened due to dehydration and UTI  - possible has underlying chronic kidney disease -  Creatinine has been improving, 1.29 on 5/24, BMET pending  - Continue gentle hydration, stop NSAIDs   Active Problems:  Acute blood loss anemia in the setting of chronic anemia likely  due to epistaxis - Patient received 2 units of packed RBCs - Hemoglobin improved to 9.2 after transfusion  Hypoalbuminemia Nutrition consult obtained, started on nutritional supplements   Pseudomonas urinary tract infection No leukocytosis, UA positive for UTI - Place on IV Rocephin, Follow urine culture and sensitivities  Hx of CHF : EF unknown  Holding lasix, aspirin. Due to GI bleed and ARF. Continue Coreg Will need to restart Lasix when appropriate.  Prolonged QT  keep K ~4, Mg ~2,  Avoid Zofran and other QT prolonging medications.  HTN Continue Coreg. Hold Lasix and lisinpril for now. PRN Hydralazine.  COPD: Stable, no wheezing  Continue Advair Diskus.  Code Status: Full code   Family Communication: Discussed in detail with the patient, all imaging results, lab results explained to the patient    Disposition Plan: Awaiting urine culture and sensitivities  Time Spent in minutes 25 minutes  Procedures  None   Consults   None   DVT Prophylaxis  TED hoses  Medications  Scheduled Meds: . capsicum   Topical BID  .  carvedilol  3.125 mg Oral BID WC  . cefTRIAXone (ROCEPHIN)  IV  1 g Intravenous Q24H  . cholecalciferol  1,000 Units Oral q1800  . docusate sodium  100 mg Oral BID  . famotidine  20 mg Oral Daily  . feeding supplement (ENSURE ENLIVE)  237 mL Oral BID BM  . ferrous sulfate  325 mg Oral Q breakfast  . fluticasone  1 spray Each Nare Daily  . levothyroxine  50 mcg Oral BH-q7a  . mometasone-formoterol  2 puff Inhalation BID  . omega-3 acid ethyl esters  1,000 mg Oral q1800  . pantoprazole  40 mg Oral Daily  . pravastatin  20 mg Oral q1800  . sodium chloride  3 mL Intravenous Q12H   Continuous Infusions: . sodium chloride 75 mL/hr at 03/03/15 1106   PRN Meds:.acetaminophen **OR** acetaminophen, HYDROcodone-acetaminophen, ondansetron **OR** ondansetron (ZOFRAN) IV, oxymetazoline   Antibiotics   Anti-infectives    Start     Dose/Rate Route  Frequency Ordered Stop   03/02/15 1245  cefTRIAXone (ROCEPHIN) 1 g in dextrose 5 % 50 mL IVPB - Premix     1 g 100 mL/hr over 30 Minutes Intravenous Every 24 hours 03/02/15 1238          Subjective:   [name] was seen and examined today.  No further epistaxis. No acute issues overnight. Patient denies any pain otherwise feeling somewhat weak today. Patient denies dizziness, chest pain, shortness of breath, abdominal pain, N/V/D/C, new weakness, numbess, tingling.  Objective:   Blood pressure 157/61, pulse 91, temperature 99.4 F (37.4 C), temperature source Oral, resp. rate 18, weight 80.32 kg (177 lb 1.2 oz), SpO2 100 %.  Wt Readings from Last 3 Encounters:  03/03/15 80.32 kg (177 lb 1.2 oz)     Intake/Output Summary (Last 24 hours) at 03/03/15 1240 Last data filed at 03/02/15 1900  Gross per 24 hour  Intake 1083.75 ml  Output      0 ml  Net 1083.75 ml    Exam  General: Alert and oriented x 3, NAD  HEENT:  PERRLA, EOMI, Anicteic Sclera  Neck: Supple, no JVD, no masses  CVS: S1 S2 clear RRR  Respiratory: CTAB  Abdomen: Soft, nontender, nondistended, + bowel sounds, + colostomy   Ext: no cyanosis clubbing or edema  Neuro: AAOx3, Cr N's II- XII. Strength 5/5 upper and lower extremities bilaterally  Skin: No rashes  Psych: Normal affect and demeanor, alert and oriented x3    Data Review   Micro Results Recent Results (from the past 240 hour(s))  MRSA PCR Screening     Status: None   Collection Time: 03/01/15  4:38 PM  Result Value Ref Range Status   MRSA by PCR NEGATIVE NEGATIVE Final    Comment:        The GeneXpert MRSA Assay (FDA approved for NASAL specimens only), is one component of a comprehensive MRSA colonization surveillance program. It is not intended to diagnose MRSA infection nor to guide or monitor treatment for MRSA infections.   Urine culture     Status: None (Preliminary result)   Collection Time: 03/02/15  8:48 AM  Result  Value Ref Range Status   Specimen Description URINE, RANDOM  Final   Special Requests NONE  Final   Colony Count   Final    >=100,000 COLONIES/ML Performed at [facility]    Culture   Final    PSEUDOMONAS AERUGINOSA Performed at [facility]    Report Status  PENDING  Incomplete    Radiology Reports Dg Chest Port 1 View  03/01/2015   CLINICAL DATA:  Sudden onset of fever today.  EXAM: PORTABLE CHEST - 1 VIEW  COMPARISON:  02/13/2009.  FINDINGS: Cardiac silhouette mildly enlarged. No mediastinal hilar masses. Left anterior chest wall sequential pacemaker leads project over the right atrium right ventricle.  No lung consolidation or edema. No pleural effusion or pneumothorax.  Bony thorax is demineralized but grossly intact.  IMPRESSION: No acute cardiopulmonary disease.   Electronically Signed   By: Lajean Manes M.D.   On: 03/01/2015 21:20    CBC  Recent Labs Lab 03/01/15 1252 03/01/15 1312 03/01/15 2233 03/02/15 0528  WBC 7.4  --  7.3 7.2  HGB 7.0* 8.8* 9.3* 9.2*  HCT 23.6* 26.0* 29.4* 29.3*  PLT 495*  --  420* 412*  MCV 86.8  --  85.0 85.2  MCH 25.7*  --  26.9 26.7  MCHC 29.7*  --  31.6 31.4  RDW 16.9*  --  16.5* 16.5*  LYMPHSABS 2.0  --   --   --   MONOABS 0.7  --   --   --   EOSABS 0.0  --   --   --   BASOSABS 0.0  --   --   --     Chemistries   Recent Labs Lab 03/01/15 1252 03/01/15 1312 03/02/15 0019 03/02/15 0528  NA 138 140  --  138  K 3.9 3.9  --  3.9  CL 101 99*  --  105  CO2 27  --   --  25  GLUCOSE 91 90  --  80  BUN 33* 35*  --  28*  CREATININE 1.55* 1.60*  --  1.29*  CALCIUM 9.4  --   --  9.4  MG  --   --  1.8  --   AST 19  --   --  15  ALT 11*  --   --  9*  ALKPHOS 43  --   --  41  BILITOT 0.1*  --   --  0.6   ------------------------------------------------------------------------------------------------------------------ CrCl cannot be calculated (Unknown ideal  weight.). ------------------------------------------------------------------------------------------------------------------ No results for input(s): HGBA1C in the last 72 hours. ------------------------------------------------------------------------------------------------------------------ No results for input(s): CHOL, HDL, LDLCALC, TRIG, CHOLHDL, LDLDIRECT in the last 72 hours. ------------------------------------------------------------------------------------------------------------------  Recent Labs  03/02/15 0019  TSH 3.221   ------------------------------------------------------------------------------------------------------------------  Recent Labs  03/02/15 0019  VITAMINB12 340  FERRITIN 156  TIBC 200*  IRON 24*  RETICCTPCT 0.8    Coagulation profile  Recent Labs Lab 03/02/15 0019  INR 1.19    No results for input(s): DDIMER in the last 72 hours.  Cardiac Enzymes No results for input(s): CKMB, TROPONINI, MYOGLOBIN in the last 168 hours.  Invalid input(s): CK ------------------------------------------------------------------------------------------------------------------ Invalid input(s): [place]  03/02/15 1210 03/02/15 1713 03/02/15 1843 03/02/15 2150 03/03/15 0817 03/03/15 1157  GLUCAP 101* 55* 99 100* 70 129*     RAI,RIPUDEEP M.D. [facility] 03/03/2015, 12:40 PM  Pager: [phone]   Between 7am to 7pm - call Pager - [phone]  After 7pm go to [email or web address] - password [email or web address]  Call night coverage person covering after 7pm

## 2015-03-03 NOTE — Evaluation (Signed)
Occupational Therapy Evaluation Patient Details Name: [name] MRN: [id number] DOB: 14-Aug-1934 Today's Date: 03/03/2015    History of Present Illness Patient is a 81 year old female with COPD, CHF, diabetes, hypertension, bedbound was essentially to ED from skilled nursing facility for hemoglobin of 6.6 - Found to have acute renal failure.   Clinical Impression   Pt with decline in function and safety with ADLs and ADL mobility with decreased strength, balance and endurance. Pt would benefit from acute OT services to address impairments to increase level of function and safety    Follow Up Recommendations  SNF;Supervision/Assistance - 24 hour    Equipment Recommendations  Other (comment) (TBD at next venue of care)    Recommendations for Other Services       Precautions / Restrictions Precautions Precautions: Fall Restrictions Weight Bearing Restrictions: Yes LLE Weight Bearing:  (boot for sprain) Other Position/Activity Restrictions: CAM boot for LT foot      Mobility Bed Mobility Overal bed mobility: Needs Assistance Bed Mobility: Supine to Sit;Sit to Supine     Supine to sit: Mod assist Sit to supine: Mod assist;HOB elevated   General bed mobility comments: used rails, trunk support  Transfers Overall transfer level: Needs assistance Equipment used: Rolling walker (2 wheeled) Transfers: Sit to/from Stand Sit to Stand: Mod assist;From elevated surface         General transfer comment: attempted x 3 before abole to stand to RW, cues for correct hand placement    Balance Overall balance assessment: Needs assistance Sitting-balance support: No upper extremity supported;Feet supported Sitting balance-Leahy Scale: Fair     Standing balance support: Bilateral upper extremity supported;During functional activity Standing balance-Leahy Scale: Poor                              ADL Overall ADL's : Needs assistance/impaired     Grooming:  Wash/dry hands;Wash/dry face;Sitting;Set up;Supervision/safety   Upper Body Bathing: Supervision/ safety;Set up;Sitting   Lower Body Bathing: Maximal assistance;Sitting/lateral leans   Upper Body Dressing : Supervision/safety;Set up   Lower Body Dressing: Total assistance   Toilet Transfer: Moderate assistance;RW;BSC;Cueing for safety   Toileting- Clothing Manipulation and Hygiene: Total assistance       Functional mobility during ADLs: Moderate assistance       Vision  reading glasses, no change from baseline   Perception Perception Perception Tested?: No   Praxis  no    Pertinent Vitals/Pain Pain Assessment: No/denies pain     Hand Dominance Right   Extremity/Trunk Assessment Upper Extremity Assessment Upper Extremity Assessment: Generalized weakness   Lower Extremity Assessment Lower Extremity Assessment: Defer to PT evaluation       Communication Communication Communication: No difficulties   Cognition Arousal/Alertness: Awake/alert Behavior During Therapy: WFL for tasks assessed/performed Overall Cognitive Status: No family/caregiver present to determine baseline cognitive functioning                     General Comments   pt pleasant, required encouragement                 Home Living Family/patient expects to be discharged to:: Skilled nursing facility   Available Help at Discharge: [facility] [place] Type of Home: Skilled Nursing Facility                           Additional Comments: States she has been working with therapy 2x/week  for her Lt ankle.       Prior Functioning/Environment Level of Independence: Needs assistance  Gait / Transfers Assistance Needed: States she ambulates with therapy ADL's / Homemaking Assistance Needed: Needs assist to bath/dress        OT Diagnosis: Generalized weakness   OT Problem List: Decreased strength;Decreased knowledge of use of DME or AE;Decreased activity  tolerance;Impaired balance (sitting and/or standing)   OT Treatment/Interventions: Self-care/ADL training;Therapeutic exercise;Patient/family education;Therapeutic activities;DME and/or AE instruction;Balance training    OT Goals(Current goals can be found in the care plan section) Acute Rehab OT Goals Patient Stated Goal: Go back to rehab OT Goal Formulation: With patient Time For Goal Achievement: 03/10/15 Potential to Achieve Goals: Good ADL Goals Pt Will Perform Lower Body Bathing: with mod assist;sitting/lateral leans;sit to/from stand Pt Will Perform Lower Body Dressing: with max assist;with mod assist;sitting/lateral leans;sit to/from stand Pt Will Transfer to Toilet: with min assist;bedside commode Pt Will Perform Toileting - Clothing Manipulation and hygiene: with max assist;with mod assist;sit to/from stand Additional ADL Goal #1: bed mobility with min A to sit EOB in prep for ADLs  OT Frequency: Min 2X/week   Barriers to D/C: Decreased caregiver support                        End of Session Equipment Utilized During Treatment: Gait belt;Rolling walker;Other (comment) (BSC)  Activity Tolerance: Patient tolerated treatment well Patient left: in bed;with call bell/phone within reach   Time: 1243-1306 OT Time Calculation (min): 23 min Charges:  OT General Charges $OT Visit: 1 Procedure OT Evaluation $Initial OT Evaluation Tier I: 1 Procedure OT Treatments $Therapeutic Activity: 8-22 mins G-Codes:    Britt Bottom 03/03/2015, 1:44 PM

## 2015-03-04 DIAGNOSIS — N179 Acute kidney failure, unspecified: Principal | ICD-10-CM

## 2015-03-04 DIAGNOSIS — I4581 Long QT syndrome: Secondary | ICD-10-CM

## 2015-03-04 DIAGNOSIS — N390 Urinary tract infection, site not specified: Secondary | ICD-10-CM

## 2015-03-04 LAB — CBC
HEMATOCRIT: 28.4 % — AB (ref 36.0–46.0)
Hemoglobin: 8.6 g/dL — ABNORMAL LOW (ref 12.0–15.0)
MCH: 26.1 pg (ref 26.0–34.0)
MCHC: 30.3 g/dL (ref 30.0–36.0)
MCV: 86.1 fL (ref 78.0–100.0)
Platelets: 398 10*3/uL (ref 150–400)
RBC: 3.3 MIL/uL — AB (ref 3.87–5.11)
RDW: 16.4 % — ABNORMAL HIGH (ref 11.5–15.5)
WBC: 6.9 10*3/uL (ref 4.0–10.5)

## 2015-03-04 LAB — URINE CULTURE

## 2015-03-04 LAB — BASIC METABOLIC PANEL
Anion gap: 9 (ref 5–15)
BUN: 18 mg/dL (ref 6–20)
CALCIUM: 9 mg/dL (ref 8.9–10.3)
CO2: 25 mmol/L (ref 22–32)
Chloride: 107 mmol/L (ref 101–111)
Creatinine, Ser: 0.91 mg/dL (ref 0.44–1.00)
GFR calc Af Amer: >60 mL/min (ref >60)
GFR calc non Af Amer: 59 mL/min — ABNORMAL LOW (ref >60)
GLUCOSE: 83 mg/dL (ref 65–99)
Potassium: 3.6 mmol/L (ref 3.5–5.1)
Sodium: 141 mmol/L (ref 135–145)

## 2015-03-04 LAB — GLUCOSE, CAPILLARY
GLUCOSE-CAPILLARY: 86 mg/dL (ref 65–99)
GLUCOSE-CAPILLARY: 95 mg/dL (ref 65–99)
GLUCOSE-CAPILLARY: 89 mg/dL (ref 65–99)

## 2015-03-04 MED ORDER — CIPROFLOXACIN HCL 500 MG PO TABS
500.0000 mg | ORAL_TABLET | Freq: Two times a day (BID) | ORAL | Status: DC
  Administered 2015-03-04: 500 mg via ORAL
  Filled 2015-03-04 (×3): qty 1

## 2015-03-04 MED ORDER — CIPROFLOXACIN HCL 500 MG PO TABS: 500.0000 mg | ORAL_TABLET | Freq: Two times a day (BID) | ORAL | Status: DC

## 2015-03-04 MED ORDER — CARVEDILOL 6.25 MG PO TABS: 6.2500 mg | ORAL_TABLET | Freq: Two times a day (BID) | ORAL | Status: DC

## 2015-03-04 MED ORDER — CARVEDILOL 6.25 MG PO TABS
6.2500 mg | ORAL_TABLET | Freq: Two times a day (BID) | ORAL | Status: DC
  Administered 2015-03-04: 6.25 mg via ORAL
  Filled 2015-03-04 (×2): qty 1

## 2015-03-04 NOTE — Progress Notes (Signed)
Pt prepared for d/c to SNF. IV d/c'd. Skin intact except as most recently charted. Vitals are stable. Report called to receiving facility. Pt to be transported by ambulance service. 

## 2015-03-04 NOTE — Progress Notes (Signed)
Pt slightly hypertensive BP 162/66 manual. Pt asymptomatic with no other signs of visible distress.On-call clinician text paged at approximately [phone]. Still waiting on response.

## 2015-03-04 NOTE — Discharge Summary (Signed)
Physician Discharge Summary  [name] QMG:[id number] DOB: 10/17/1933 DOA: 03/01/2015  PCP: No primary care provider on file.  Admit date: 03/01/2015 Discharge date: 03/04/2015  Time spent: 35 minutes  Recommendations for Outpatient Follow-up:  1. To SNF 2. cipro until 5/30   Discharge Diagnoses:  Principal Problem:   Acute renal failure Active Problems:   Anemia   HTN (hypertension)   DM (dermatomyositis)   Bedridden   Colostomy in place   Prolonged QT interval   Hypoalbuminemia   Fever in adult   Discharge Condition: improved  Diet recommendation: heart healthy  Filed Weights   03/02/15 1200 03/03/15 0528 03/04/15 0520  Weight: 80.241 kg (176 lb 14.4 oz) 80.32 kg (177 lb 1.2 oz) 84.913 kg (187 lb 3.2 oz)    History of present illness:  Patient is a 81 year old female with COPD, CHF, diabetes, hypertension, bedbound was essentially to ED from skilled nursing facility for hemoglobin of 6.6. Patient was unable to provide any history due to dementia. Patient had nosebleed or day before from her left nare and that stopped with use of an ice pack. She denied melena, hematochezia, hematemesis, bruising, hematuria. She has had anemia for a long time and is on iron supplementation. Stool was obtained from her colostomy bag by the EDP and was guaiac negative. She denied any abdominal pain or indigestion. She had been eating well lately. She denied abnormal output in her colostomy bag. She did not take NSAIDs with the exception of diclofenac gel that she has placed on her knees for osteoarthritis. In the ER, [name] workup was noteworthy for a creatinine of 1.6 (baseline 1.0), BUN elevated at 35. On exam she appeared somewhat dry. Skilled nursing facility noted a hemoglobin of 6.6, and in the ER it was 7.0. The last baseline hemoglobin we have was from several years ago and it was 10.5. Protein is 7.6, albumin is 2.7. While in the ER she has developed a low grade  fever  Hospital Course:  Acute renal failure: Likely worsened due to dehydration and UTI  - possible has underlying chronic kidney disease - Creatinine  improving  Acute blood loss anemia in the setting of chronic anemia likely due to epistaxis- resolved - Patient received 2 units of packed RBCs - Hemoglobin improved to 9.2 after transfusion  Hypoalbuminemia Nutrition consult obtained, started on nutritional supplements   Pseudomonas urinary tract infection No leukocytosis, UA positive for UTI - cipro until 5/30  Hx of CHF : EF unknown  Holding lasix, aspirin. Due to GI bleed and ARF. Continue Coreg Will need to restart Lasix when appropriate.  Prolonged QT keep K ~4, Mg ~2, Avoid Zofran and other QT prolonging medications.  HTN increase Coreg. Hold Lasix and lisinpril for now due to    COPD: Stable, no wheezing  Continue Advair Diskus.  Procedures:    Consultations:    Discharge Exam: Filed Vitals:   03/04/15 0738  BP: 167/78  Pulse:   Temp:   Resp:     General: A+Ox3, NAD Cardiovascular: rrr Respiratory: clear  Discharge Instructions   Discharge Instructions    (HEART FAILURE PATIENTS) Call MD:  Anytime you have any of the following symptoms: 1) 3 pound weight gain in 24 hours or 5 pounds in 1 week 2) shortness of breath, with or without a dry hacking cough 3) swelling in the hands, feet or stomach 4) if you have to sleep on extra pillows at night in order to breathe.  Complete by:  As directed      Diet - low sodium heart healthy    Complete by:  As directed      Increase activity slowly    Complete by:  As directed           Current Discharge Medication List    START taking these medications   Details  capsicum (ZOSTRIX) 0.075 % topical cream Apply topically 2 (two) times daily. Qty: 28.3 g, Refills: 0    ciprofloxacin (CIPRO) 500 MG tablet Take 1 tablet (500 mg total) by mouth 2 (two) times daily.    feeding supplement,  ENSURE ENLIVE, (ENSURE ENLIVE) LIQD Take 237 mLs by mouth 2 (two) times daily between meals. Qty: 237 mL, Refills: 12    oxymetazoline (AFRIN) 0.05 % nasal spray Place 1 spray into both nostrils 2 (two) times daily as needed (nose bleed). Qty: 30 mL, Refills: 0      CONTINUE these medications which have CHANGED   Details  aspirin EC 81 MG tablet Take 1 tablet (81 mg total) by mouth every morning.    carvedilol (COREG) 6.25 MG tablet Take 1 tablet (6.25 mg total) by mouth 2 (two) times daily with a meal.    furosemide (LASIX) 40 MG tablet Take 1 tablet (40 mg total) by mouth as needed for fluid or edema (heart failure). If more than 3 lb weight gain in 24 hrs give 1 tabs Qty: 30 tablet, Refills: 5    HYDROcodone-acetaminophen (NORCO/VICODIN) 5-325 MG per tablet Take 1 tablet by mouth every 4 (four) hours as needed (pain). Qty: 10 tablet, Refills: 0      CONTINUE these medications which have NOT CHANGED   Details  acetaminophen (TYLENOL) 325 MG tablet Take 650 mg by mouth every 6 (six) hours as needed (pain).    ADVAIR DISKUS 500-50 MCG/DOSE AEPB Inhale 1 puff into the lungs every 12 (twelve) hours. Refills: 3    cholecalciferol (VITAMIN D) 1000 UNITS tablet Take 1,000 Units by mouth daily at 6 PM.    diclofenac sodium (VOLTAREN) 1 % GEL Apply 4 g topically 3 (three) times daily.    ferrous sulfate 325 (65 FE) MG tablet Take 325 mg by mouth every morning.    fluticasone (FLONASE) 50 MCG/ACT nasal spray Place 1 spray into both nostrils daily.    levothyroxine (SYNTHROID, LEVOTHROID) 50 MCG tablet Take 50 mcg by mouth every morning.  Refills: 2    Omega 3 1000 MG CAPS Take 1,000 mg by mouth daily at 6 PM.     pravastatin (PRAVACHOL) 20 MG tablet Take 20 mg by mouth daily at 6 PM.    ranitidine (ZANTAC) 150 MG tablet Take 150 mg by mouth daily at 6 PM.  Refills: 11      STOP taking these medications     lisinopril (PRINIVIL,ZESTRIL) 10 MG tablet        No Known  Allergies Follow-up Information    Follow up with GREEN, Viviann Spare, MD. Schedule an appointment as soon as possible for a visit in 10 days.   Specialty:  Internal Medicine   Why:  for hospital follow-up   Contact information:   [place] [address] [phone]        The results of significant diagnostics from this hospitalization (including imaging, microbiology, ancillary and laboratory) are listed below for reference.    Significant Diagnostic Studies: Dg Chest Port 1 View  03/01/2015   CLINICAL DATA:  Sudden onset  of fever today.  EXAM: PORTABLE CHEST - 1 VIEW  COMPARISON:  02/13/2009.  FINDINGS: Cardiac silhouette mildly enlarged. No mediastinal hilar masses. Left anterior chest wall sequential pacemaker leads project over the right atrium right ventricle.  No lung consolidation or edema. No pleural effusion or pneumothorax.  Bony thorax is demineralized but grossly intact.  IMPRESSION: No acute cardiopulmonary disease.   Electronically Signed   By: Lajean Manes M.D.   On: 03/01/2015 21:20    Microbiology: Recent Results (from the past 240 hour(s))  MRSA PCR Screening     Status: None   Collection Time: 03/01/15  4:38 PM  Result Value Ref Range Status   MRSA by PCR NEGATIVE NEGATIVE Final    Comment:        The GeneXpert MRSA Assay (FDA approved for NASAL specimens only), is one component of a comprehensive MRSA colonization surveillance program. It is not intended to diagnose MRSA infection nor to guide or monitor treatment for MRSA infections.   Urine culture     Status: None   Collection Time: 03/02/15  8:48 AM  Result Value Ref Range Status   Specimen Description URINE, RANDOM  Final   Special Requests NONE  Final   Colony Count   Final    >=100,000 COLONIES/ML Performed at [facility]    Culture   Final    PSEUDOMONAS AERUGINOSA Performed at [facility]    Report Status 03/04/2015 FINAL  Final   Organism ID, Bacteria  PSEUDOMONAS AERUGINOSA  Final      Susceptibility   Pseudomonas aeruginosa - MIC*    CEFEPIME 2 SENSITIVE Sensitive     CEFTAZIDIME 4 SENSITIVE Sensitive     CIPROFLOXACIN <=0.25 SENSITIVE Sensitive     GENTAMICIN 4 SENSITIVE Sensitive     IMIPENEM 2 SENSITIVE Sensitive     PIP/TAZO 8 SENSITIVE Sensitive     TOBRAMYCIN <=1 SENSITIVE Sensitive     * PSEUDOMONAS AERUGINOSA     Labs: Basic Metabolic Panel:  Recent Labs Lab 03/01/15 1252 03/01/15 1312 03/02/15 0019 03/02/15 0528 03/04/15 0553  NA 138 140  --  138 141  K 3.9 3.9  --  3.9 3.6  CL 101 99*  --  105 107  CO2 27  --   --  25 25  GLUCOSE 91 90  --  80 83  BUN 33* 35*  --  28* 18  CREATININE 1.55* 1.60*  --  1.29* 0.91  CALCIUM 9.4  --   --  9.4 9.0  MG  --   --  1.8  --   --    Liver Function Tests:  Recent Labs Lab 03/01/15 1252 03/02/15 0528  AST 19 15  ALT 11* 9*  ALKPHOS 43 41  BILITOT 0.1* 0.6  PROT 7.6 7.4  ALBUMIN 2.7* 2.7*   No results for input(s): LIPASE, AMYLASE in the last 168 hours. No results for input(s): AMMONIA in the last 168 hours. CBC:  Recent Labs Lab 03/01/15 1252 03/01/15 1312 03/01/15 2233 03/02/15 0019 03/02/15 0528 03/03/15 1245 03/04/15 0553  WBC 7.4  --  7.3  --  7.2 9.0 6.9  NEUTROABS 4.6  --   --   --   --   --   --   HGB 7.0* 8.8* 9.3*  --  9.2* 9.6* 8.6*  HCT 23.6* 26.0* 29.4* 28.9* 29.3* 31.4* 28.4*  MCV 86.8  --  85.0  --  85.2 86.3 86.1  PLT 495*  --  420*  --  412* 451* 398   Cardiac Enzymes: No results for input(s): CKTOTAL, CKMB, CKMBINDEX, TROPONINI in the last 168 hours. BNP: BNP (last 3 results) No results for input(s): BNP in the last 8760 hours.  ProBNP (last 3 results) No results for input(s): PROBNP in the last 8760 hours.  CBG:  Recent Labs Lab 03/03/15 1708 03/03/15 2122 03/04/15 0553 03/04/15 0746 03/04/15 1146  GLUCAP 86 65 86 95 89       Signed:  Jaykob Minichiello  [facility] 03/04/2015, 12:46 PM

## 2015-03-04 NOTE — Clinical Social Work Note (Signed)
Per MD patient ready to DC back to [facility]. RN, patient/family [name]), and facility notified of patient's DC. RN given number for report. DC packet on patient's chart. Ambulance transport requested for patient. CSW signing off at this time.   Liz Beach MSW, [place], [place], [phone]

## 2015-03-18 ENCOUNTER — Encounter (HOSPITAL_COMMUNITY): Payer: Self-pay | Admitting: Emergency Medicine

## 2015-03-18 ENCOUNTER — Inpatient Hospital Stay (HOSPITAL_COMMUNITY): Payer: Medicare HMO

## 2015-03-18 ENCOUNTER — Inpatient Hospital Stay (HOSPITAL_COMMUNITY)
Admission: EM | Admit: 2015-03-18 | Discharge: 2015-03-24 | DRG: 286 | Disposition: A | Discharge status: Skilled Nursing Facility | Payer: Medicare HMO | Attending: Internal Medicine | Admitting: Internal Medicine

## 2015-03-18 ENCOUNTER — Emergency Department (HOSPITAL_COMMUNITY): Payer: Medicare HMO

## 2015-03-18 DIAGNOSIS — E039 Hypothyroidism, unspecified: Secondary | ICD-10-CM | POA: Diagnosis present

## 2015-03-18 DIAGNOSIS — J45909 Unspecified asthma, uncomplicated: Secondary | ICD-10-CM | POA: Diagnosis present

## 2015-03-18 DIAGNOSIS — I200 Unstable angina: Secondary | ICD-10-CM | POA: Diagnosis present

## 2015-03-18 DIAGNOSIS — Z95810 Presence of automatic (implantable) cardiac defibrillator: Secondary | ICD-10-CM | POA: Diagnosis not present

## 2015-03-18 DIAGNOSIS — E46 Unspecified protein-calorie malnutrition: Secondary | ICD-10-CM | POA: Diagnosis present

## 2015-03-18 DIAGNOSIS — J9600 Acute respiratory failure, unspecified whether with hypoxia or hypercapnia: Secondary | ICD-10-CM | POA: Diagnosis present

## 2015-03-18 DIAGNOSIS — I5041 Acute combined systolic (congestive) and diastolic (congestive) heart failure: Secondary | ICD-10-CM | POA: Diagnosis not present

## 2015-03-18 DIAGNOSIS — I5023 Acute on chronic systolic (congestive) heart failure: Secondary | ICD-10-CM | POA: Diagnosis present

## 2015-03-18 DIAGNOSIS — E785 Hyperlipidemia, unspecified: Secondary | ICD-10-CM | POA: Diagnosis present

## 2015-03-18 DIAGNOSIS — D649 Anemia, unspecified: Secondary | ICD-10-CM

## 2015-03-18 DIAGNOSIS — I447 Left bundle-branch block, unspecified: Secondary | ICD-10-CM | POA: Diagnosis present

## 2015-03-18 DIAGNOSIS — I429 Cardiomyopathy, unspecified: Secondary | ICD-10-CM | POA: Diagnosis present

## 2015-03-18 DIAGNOSIS — F0390 Unspecified dementia without behavioral disturbance: Secondary | ICD-10-CM | POA: Diagnosis present

## 2015-03-18 DIAGNOSIS — Z7982 Long term (current) use of aspirin: Secondary | ICD-10-CM

## 2015-03-18 DIAGNOSIS — I129 Hypertensive chronic kidney disease with stage 1 through stage 4 chronic kidney disease, or unspecified chronic kidney disease: Secondary | ICD-10-CM | POA: Diagnosis present

## 2015-03-18 DIAGNOSIS — E876 Hypokalemia: Secondary | ICD-10-CM | POA: Diagnosis not present

## 2015-03-18 DIAGNOSIS — I10 Essential (primary) hypertension: Secondary | ICD-10-CM | POA: Diagnosis not present

## 2015-03-18 DIAGNOSIS — D638 Anemia in other chronic diseases classified elsewhere: Secondary | ICD-10-CM | POA: Diagnosis not present

## 2015-03-18 DIAGNOSIS — R079 Chest pain, unspecified: Secondary | ICD-10-CM

## 2015-03-18 DIAGNOSIS — Z933 Colostomy status: Secondary | ICD-10-CM

## 2015-03-18 DIAGNOSIS — I5031 Acute diastolic (congestive) heart failure: Secondary | ICD-10-CM | POA: Diagnosis not present

## 2015-03-18 DIAGNOSIS — N189 Chronic kidney disease, unspecified: Secondary | ICD-10-CM | POA: Diagnosis present

## 2015-03-18 DIAGNOSIS — R748 Abnormal levels of other serum enzymes: Secondary | ICD-10-CM | POA: Diagnosis present

## 2015-03-18 DIAGNOSIS — M3313 Other dermatomyositis without myopathy: Secondary | ICD-10-CM | POA: Diagnosis present

## 2015-03-18 DIAGNOSIS — M7989 Other specified soft tissue disorders: Secondary | ICD-10-CM | POA: Diagnosis not present

## 2015-03-18 DIAGNOSIS — Z86711 Personal history of pulmonary embolism: Secondary | ICD-10-CM

## 2015-03-18 DIAGNOSIS — J449 Chronic obstructive pulmonary disease, unspecified: Secondary | ICD-10-CM | POA: Diagnosis present

## 2015-03-18 DIAGNOSIS — J189 Pneumonia, unspecified organism: Secondary | ICD-10-CM

## 2015-03-18 DIAGNOSIS — Z8249 Family history of ischemic heart disease and other diseases of the circulatory system: Secondary | ICD-10-CM | POA: Diagnosis not present

## 2015-03-18 DIAGNOSIS — R918 Other nonspecific abnormal finding of lung field: Secondary | ICD-10-CM

## 2015-03-18 DIAGNOSIS — Z85038 Personal history of other malignant neoplasm of large intestine: Secondary | ICD-10-CM

## 2015-03-18 DIAGNOSIS — I509 Heart failure, unspecified: Secondary | ICD-10-CM | POA: Diagnosis not present

## 2015-03-18 DIAGNOSIS — M3390 Dermatopolymyositis, unspecified, organ involvement unspecified: Secondary | ICD-10-CM | POA: Diagnosis present

## 2015-03-18 DIAGNOSIS — Z7951 Long term (current) use of inhaled steroids: Secondary | ICD-10-CM

## 2015-03-18 DIAGNOSIS — I5021 Acute systolic (congestive) heart failure: Secondary | ICD-10-CM | POA: Diagnosis not present

## 2015-03-18 DIAGNOSIS — R32 Unspecified urinary incontinence: Secondary | ICD-10-CM | POA: Diagnosis present

## 2015-03-18 DIAGNOSIS — R0602 Shortness of breath: Secondary | ICD-10-CM

## 2015-03-18 DIAGNOSIS — E119 Type 2 diabetes mellitus without complications: Secondary | ICD-10-CM | POA: Diagnosis present

## 2015-03-18 DIAGNOSIS — J9811 Atelectasis: Secondary | ICD-10-CM | POA: Diagnosis present

## 2015-03-18 DIAGNOSIS — I2510 Atherosclerotic heart disease of native coronary artery without angina pectoris: Secondary | ICD-10-CM | POA: Diagnosis not present

## 2015-03-18 DIAGNOSIS — R7989 Other specified abnormal findings of blood chemistry: Secondary | ICD-10-CM | POA: Diagnosis not present

## 2015-03-18 LAB — BASIC METABOLIC PANEL
Anion gap: 11 (ref 5–15)
BUN: 10 mg/dL (ref 6–20)
CO2: 23 mmol/L (ref 22–32)
Calcium: 9.4 mg/dL (ref 8.9–10.3)
Chloride: 103 mmol/L (ref 101–111)
Creatinine, Ser: 0.94 mg/dL (ref 0.44–1.00)
GFR calc Af Amer: >60 mL/min (ref >60)
GFR, EST NON AFRICAN AMERICAN: 57 mL/min — AB (ref >60)
Glucose, Bld: 167 mg/dL — ABNORMAL HIGH (ref 65–99)
Potassium: 4.3 mmol/L (ref 3.5–5.1)
SODIUM: 137 mmol/L (ref 135–145)

## 2015-03-18 LAB — CBC
HCT: 32.8 % — ABNORMAL LOW (ref 36.0–46.0)
Hemoglobin: 9.9 g/dL — ABNORMAL LOW (ref 12.0–15.0)
MCH: 26.2 pg (ref 26.0–34.0)
MCHC: 30.2 g/dL (ref 30.0–36.0)
MCV: 86.8 fL (ref 78.0–100.0)
Platelets: 575 10*3/uL — ABNORMAL HIGH (ref 150–400)
RBC: 3.78 MIL/uL — ABNORMAL LOW (ref 3.87–5.11)
RDW: 16.8 % — ABNORMAL HIGH (ref 11.5–15.5)
WBC: 8.6 10*3/uL (ref 4.0–10.5)

## 2015-03-18 LAB — TROPONIN I
TROPONIN I: 0.05 ng/mL — AB (ref <0.031)
Troponin I: 0.04 ng/mL — ABNORMAL HIGH (ref <0.031)
Troponin I: 0.04 ng/mL — ABNORMAL HIGH (ref <0.031)

## 2015-03-18 LAB — D-DIMER, QUANTITATIVE (NOT AT ARMC): D DIMER QUANT: 4.63 ug{FEU}/mL — AB (ref 0.00–0.48)

## 2015-03-18 LAB — I-STAT TROPONIN, ED: Troponin i, poc: 0.02 ng/mL (ref 0.00–0.08)

## 2015-03-18 LAB — BRAIN NATRIURETIC PEPTIDE: B NATRIURETIC PEPTIDE 5: 1270.3 pg/mL — AB (ref 0.0–100.0)

## 2015-03-18 LAB — TSH: TSH: 2.233 u[IU]/mL (ref 0.350–4.500)

## 2015-03-18 LAB — MRSA PCR SCREENING: MRSA by PCR: NEGATIVE

## 2015-03-18 MED ORDER — ENOXAPARIN SODIUM 40 MG/0.4ML ~~LOC~~ SOLN
40.0000 mg | SUBCUTANEOUS | Status: DC
  Administered 2015-03-18: 40 mg via SUBCUTANEOUS
  Filled 2015-03-18: qty 0.4

## 2015-03-18 MED ORDER — ASPIRIN EC 81 MG PO TBEC
81.0000 mg | DELAYED_RELEASE_TABLET | Freq: Every day | ORAL | Status: DC
Start: 2015-03-18 — End: 2015-03-21
  Administered 2015-03-18 – 2015-03-21 (×4): 81 mg via ORAL
  Filled 2015-03-18 (×4): qty 1

## 2015-03-18 MED ORDER — CARVEDILOL 6.25 MG PO TABS
6.2500 mg | ORAL_TABLET | Freq: Two times a day (BID) | ORAL | Status: DC
Start: 2015-03-18 — End: 2015-03-18
  Administered 2015-03-18: 6.25 mg via ORAL
  Filled 2015-03-18: qty 1

## 2015-03-18 MED ORDER — REGADENOSON 0.4 MG/5ML IV SOLN
0.4000 mg | Freq: Once | INTRAVENOUS | Status: DC
  Filled 2015-03-18: qty 5

## 2015-03-18 MED ORDER — SODIUM CHLORIDE 0.9 % IV SOLN: 250.0000 mL | INTRAVENOUS | Status: DC | PRN

## 2015-03-18 MED ORDER — REGADENOSON 0.4 MG/5ML IV SOLN
INTRAVENOUS | Status: AC
  Filled 2015-03-18: qty 5

## 2015-03-18 MED ORDER — CETYLPYRIDINIUM CHLORIDE 0.05 % MT LIQD
7.0000 mL | Freq: Two times a day (BID) | OROMUCOSAL | Status: DC
  Administered 2015-03-18 – 2015-03-24 (×12): 7 mL via OROMUCOSAL

## 2015-03-18 MED ORDER — ALBUTEROL SULFATE (2.5 MG/3ML) 0.083% IN NEBU
5.0000 mg | INHALATION_SOLUTION | Freq: Once | RESPIRATORY_TRACT | Status: AC
  Administered 2015-03-18: 5 mg via RESPIRATORY_TRACT

## 2015-03-18 MED ORDER — ONDANSETRON HCL 4 MG/2ML IJ SOLN: 4.0000 mg | Freq: Four times a day (QID) | INTRAMUSCULAR | Status: DC | PRN

## 2015-03-18 MED ORDER — IPRATROPIUM BROMIDE 0.02 % IN SOLN
0.5000 mg | Freq: Once | RESPIRATORY_TRACT | Status: AC
Start: 2015-03-18 — End: 2015-03-18
  Administered 2015-03-18: 0.5 mg via RESPIRATORY_TRACT
  Filled 2015-03-18: qty 2.5

## 2015-03-18 MED ORDER — VANCOMYCIN HCL 10 G IV SOLR
1500.0000 mg | Freq: Once | INTRAVENOUS | Status: DC
  Filled 2015-03-18: qty 1500

## 2015-03-18 MED ORDER — FERROUS SULFATE 325 (65 FE) MG PO TABS
325.0000 mg | ORAL_TABLET | Freq: Every day | ORAL | Status: DC
  Administered 2015-03-18 – 2015-03-24 (×7): 325 mg via ORAL
  Filled 2015-03-18 (×7): qty 1

## 2015-03-18 MED ORDER — NITROGLYCERIN IN D5W 200-5 MCG/ML-% IV SOLN
20.0000 ug/min | Freq: Once | INTRAVENOUS | Status: AC
  Administered 2015-03-18: 20 ug/min via INTRAVENOUS

## 2015-03-18 MED ORDER — FAMOTIDINE 20 MG PO TABS
20.0000 mg | ORAL_TABLET | Freq: Every day | ORAL | Status: DC
Start: 2015-03-18 — End: 2015-03-24
  Administered 2015-03-18 – 2015-03-24 (×7): 20 mg via ORAL
  Filled 2015-03-18 (×7): qty 1

## 2015-03-18 MED ORDER — VANCOMYCIN HCL 10 G IV SOLR
1500.0000 mg | Freq: Once | INTRAVENOUS | Status: AC
  Administered 2015-03-18: 1500 mg via INTRAVENOUS

## 2015-03-18 MED ORDER — ISOSORBIDE MONONITRATE ER 30 MG PO TB24
30.0000 mg | ORAL_TABLET | Freq: Every day | ORAL | Status: DC
Start: 2015-03-18 — End: 2015-03-24
  Administered 2015-03-18 – 2015-03-24 (×7): 30 mg via ORAL
  Filled 2015-03-18 (×7): qty 1

## 2015-03-18 MED ORDER — PIPERACILLIN-TAZOBACTAM 3.375 G IVPB
3.3750 g | Freq: Three times a day (TID) | INTRAVENOUS | Status: DC
  Administered 2015-03-18 – 2015-03-20 (×7): 3.375 g via INTRAVENOUS
  Filled 2015-03-18 (×8): qty 50

## 2015-03-18 MED ORDER — FLUTICASONE PROPIONATE 50 MCG/ACT NA SUSP
1.0000 | Freq: Every day | NASAL | Status: DC
  Administered 2015-03-18 – 2015-03-24 (×7): 1 via NASAL
  Filled 2015-03-18 (×2): qty 16

## 2015-03-18 MED ORDER — NITROGLYCERIN IN D5W 200-5 MCG/ML-% IV SOLN
INTRAVENOUS | Status: AC
  Administered 2015-03-18: 20 ug/min via INTRAVENOUS
  Filled 2015-03-18: qty 250

## 2015-03-18 MED ORDER — HEPARIN (PORCINE) IN NACL 100-0.45 UNIT/ML-% IJ SOLN
1300.0000 [IU]/h | INTRAMUSCULAR | Status: DC
Start: 2015-03-18 — End: 2015-03-20
  Administered 2015-03-18 – 2015-03-19 (×2): 1300 [IU]/h via INTRAVENOUS
  Filled 2015-03-18 (×2): qty 250

## 2015-03-18 MED ORDER — FUROSEMIDE 10 MG/ML IJ SOLN
40.0000 mg | Freq: Two times a day (BID) | INTRAMUSCULAR | Status: DC
  Administered 2015-03-19 – 2015-03-23 (×10): 40 mg via INTRAVENOUS
  Filled 2015-03-18 (×10): qty 4

## 2015-03-18 MED ORDER — ENSURE ENLIVE PO LIQD
237.0000 mL | Freq: Two times a day (BID) | ORAL | Status: DC
  Administered 2015-03-18 – 2015-03-24 (×9): 237 mL via ORAL

## 2015-03-18 MED ORDER — ATORVASTATIN CALCIUM 20 MG PO TABS
20.0000 mg | ORAL_TABLET | Freq: Every day | ORAL | Status: DC
  Administered 2015-03-18 – 2015-03-24 (×7): 20 mg via ORAL
  Filled 2015-03-18 (×7): qty 1

## 2015-03-18 MED ORDER — ACETAMINOPHEN 325 MG PO TABS
650.0000 mg | ORAL_TABLET | ORAL | Status: DC | PRN
  Filled 2015-03-18: qty 2

## 2015-03-18 MED ORDER — LEVOTHYROXINE SODIUM 50 MCG PO TABS
50.0000 ug | ORAL_TABLET | Freq: Every day | ORAL | Status: DC
  Administered 2015-03-18 – 2015-03-24 (×7): 50 ug via ORAL
  Filled 2015-03-18 (×8): qty 1

## 2015-03-18 MED ORDER — MOMETASONE FURO-FORMOTEROL FUM 200-5 MCG/ACT IN AERO
2.0000 | INHALATION_SPRAY | Freq: Two times a day (BID) | RESPIRATORY_TRACT | Status: DC
  Administered 2015-03-18 – 2015-03-24 (×9): 2 via RESPIRATORY_TRACT
  Filled 2015-03-18 (×2): qty 8.8

## 2015-03-18 MED ORDER — FUROSEMIDE 10 MG/ML IJ SOLN
40.0000 mg | Freq: Once | INTRAMUSCULAR | Status: AC
  Administered 2015-03-18: 40 mg via INTRAVENOUS
  Filled 2015-03-18: qty 4

## 2015-03-18 MED ORDER — VANCOMYCIN HCL IN DEXTROSE 750-5 MG/150ML-% IV SOLN
750.0000 mg | Freq: Two times a day (BID) | INTRAVENOUS | Status: DC
  Administered 2015-03-19 – 2015-03-20 (×2): 750 mg via INTRAVENOUS
  Filled 2015-03-18 (×4): qty 150

## 2015-03-18 MED ORDER — PRAVASTATIN SODIUM 20 MG PO TABS: 20.0000 mg | ORAL_TABLET | Freq: Every day | ORAL | Status: DC

## 2015-03-18 MED ORDER — SODIUM CHLORIDE 0.9 % IJ SOLN
3.0000 mL | Freq: Two times a day (BID) | INTRAMUSCULAR | Status: DC
  Administered 2015-03-20 – 2015-03-21 (×3): 3 mL via INTRAVENOUS

## 2015-03-18 MED ORDER — ALBUTEROL SULFATE (2.5 MG/3ML) 0.083% IN NEBU
INHALATION_SOLUTION | RESPIRATORY_TRACT | Status: AC
  Filled 2015-03-18: qty 6

## 2015-03-18 MED ORDER — SODIUM CHLORIDE 0.9 % IJ SOLN: 3.0000 mL | INTRAMUSCULAR | Status: DC | PRN

## 2015-03-18 MED ORDER — CARVEDILOL 12.5 MG PO TABS
12.5000 mg | ORAL_TABLET | Freq: Two times a day (BID) | ORAL | Status: DC
  Administered 2015-03-18 – 2015-03-24 (×12): 12.5 mg via ORAL
  Filled 2015-03-18: qty 2
  Filled 2015-03-18 (×11): qty 1

## 2015-03-18 MED ORDER — HEPARIN BOLUS VIA INFUSION
4000.0000 [IU] | Freq: Once | INTRAVENOUS | Status: AC
  Administered 2015-03-18: 4000 [IU] via INTRAVENOUS
  Filled 2015-03-18: qty 4000

## 2015-03-18 MED ORDER — HYDROCODONE-ACETAMINOPHEN 5-325 MG PO TABS
1.0000 | ORAL_TABLET | ORAL | Status: DC | PRN
  Administered 2015-03-18 – 2015-03-23 (×4): 1 via ORAL
  Filled 2015-03-18 (×4): qty 1

## 2015-03-18 MED ORDER — CHLORHEXIDINE GLUCONATE 0.12 % MT SOLN
15.0000 mL | Freq: Two times a day (BID) | OROMUCOSAL | Status: DC
  Administered 2015-03-18 – 2015-03-24 (×8): 15 mL via OROMUCOSAL
  Filled 2015-03-18 (×8): qty 15

## 2015-03-18 MED ORDER — LISINOPRIL 5 MG PO TABS
5.0000 mg | ORAL_TABLET | Freq: Every day | ORAL | Status: DC
  Administered 2015-03-18 – 2015-03-24 (×7): 5 mg via ORAL
  Filled 2015-03-18 (×7): qty 1

## 2015-03-18 NOTE — Care Management Note (Signed)
Case Management Note  Patient Details  Name: [name] MRN: [id number] Date of Birth: 05-07-1934  Subjective/Objective:   Pt admitted from [facility] on [address]                  Action/Plan: Plan to return once stable. CM will continue to monitor for additional needs.    Expected Discharge Date:  03/20/15               Expected Discharge Plan:  Skilled Nursing Facility  In-House Referral:  Clinical Social Work  Discharge planning Services  CM Consult  Post Acute Care Choice:    Choice offered to:     DME Arranged:    DME Agency:     HH Arranged:    [place] Agency:     Status of Service:  In process, will continue to follow  Medicare Important Message Given:    Date Medicare IM Given:    Medicare IM give by:    Date Additional Medicare IM Given:    Additional Medicare Important Message give by:     If discussed at [place] of Stay Meetings, dates discussed:    Additional Comments:  Bethena Roys, RN 03/18/2015, 2:23 PM

## 2015-03-18 NOTE — Progress Notes (Signed)
ANTICOAGULATION CONSULT NOTE - Initial Consult  Pharmacy Consult for heparin Indication: rule out PE  No Known Allergies  Patient Measurements: Height: 5\' 6"  (167.6 cm) Weight: 189 lb 2.5 oz (85.8 kg) IBW/kg (Calculated) : 59.3 Heparin Dosing Weight: 85kg  Vital Signs: Temp: 98.4 F (36.9 C) (06/09 0740) Temp Source: Oral (06/09 0740) BP: 150/63 mmHg (06/09 1251) Pulse Rate: 73 (06/09 0550)  Labs:  Recent Labs  03/18/15 0305 03/18/15 0705 03/18/15 1428  HGB 9.9*  --   --   HCT 32.8*  --   --   PLT 575*  --   --   CREATININE 0.94  --   --   TROPONINI  --  0.05* 0.04*    Estimated Creatinine Clearance: 54.4 mL/min (by C-G formula based on Cr of 0.94).   Medical History: Past Medical History  Diagnosis Date  . Hypertension   . Asthma   . CHF (congestive heart failure)   . COPD (chronic obstructive pulmonary disease)   . Colon cancer   . Hyperlipidemia   . Heart murmur   . Anginal pain   . Pulmonary embolism   . Pneumonia     "just once" (03/01/2015)  . Hypothyroidism   . Diabetes mellitus without complication     pt denies this hx on 03/01/2015  . History of blood transfusion     "related to anemia, this is my 1st" (03/01/2015)  . Arthritis     "comes and goes" (03/01/2015)  . Renal disorder   . Colostomy care     Assessment: 81 y.o. female with Past medical history of hypertension, asthma, possible diastolic dysfunction, COPD, hypothyroidism, anemia, colostomy, chronic kidney disease.  Patient with mildly elevated ce's and significantly elevated d-dimer, patient now being ruled out for PE. Stress test in am if CT negative.  Last dose of lovenox given this am.  Goal of Therapy:  Heparin level 0.3-0.7 units/ml Monitor platelets by anticoagulation protocol: Yes   Plan:  Give 4000 units bolus x 1 Start heparin infusion at 1300 units/hr Check anti-Xa level in 8 hours and daily while on heparin Continue to monitor H&H and platelets  Erin Hearing  PharmD., BCPS Clinical Pharmacist Pager [phone] 03/18/2015 4:38 PM

## 2015-03-18 NOTE — Progress Notes (Signed)
Dr.Patel paged that patient has arrived from ER to room 3W16. [facility] Rn

## 2015-03-18 NOTE — ED Notes (Signed)
Per ems-- pt from [facility] on [address] for renal failure rehab. Tonight pt woke up with sob, diaphoresis. Denies cp. Pt with hx of CHF, pacemaker, htn. Pt bp elevated. Ems initiated CPAP and administered 5mg  albuterol.

## 2015-03-18 NOTE — Progress Notes (Signed)
Took pt off bipap and placed on 2L.  Pt doing well.. O2 sats 98

## 2015-03-18 NOTE — H&P (Signed)
[facility] History and Physical  Patient: [name]  MRN: [id number]  DOB: [birth year]  DOS: the patient was seen and examined on 03/18/2015 PCP: No primary care provider on file.  Chief Complaint: Shortness of breath  HPI: [name] is a 81 y.o. female with Past medical history of hypertension, asthma, possible diastolic dysfunction, COPD, hypothyroidism, anemia, colostomy, chronic kidney disease. The patient presents with complaints of sudden onset of shortness of breath. She mentions that she was sleeping and suddenly woke up from her sleep as it felt hot. She denies any chest pain denies any cough denies any choking episode denies any fever denies any chills denies any diarrhea or constipation or abdominal pain or any focal deficit. She denies any changes in her medication. Last dose of Lasix was on 03/15/2015. She denies any active bleeding. She denies any similar symptoms in the past.  The patient is coming from SNF. And at her baseline independent for most of her ADL.  Review of Systems: as mentioned in the history of present illness.  A comprehensive review of the other systems is negative.  Past Medical History  Diagnosis Date  . Hypertension   . Asthma   . CHF (congestive heart failure)   . COPD (chronic obstructive pulmonary disease)   . Colon cancer   . Hyperlipidemia   . Heart murmur   . Anginal pain   . Pulmonary embolism   . Pneumonia     "just once" (03/01/2015)  . Hypothyroidism   . Diabetes mellitus without complication     pt denies this hx on 03/01/2015  . History of blood transfusion     "related to anemia, this is my 1st" (03/01/2015)  . Arthritis     "comes and goes" (03/01/2015)  . Renal disorder   . Colostomy care    Past Surgical History  Procedure Laterality Date  . Cholecystectomy    . Colon surgery      cancer  . Fracture surgery    . Dilation and curettage of uterus    . Tubal ligation    . Cataract extraction w/ intraocular  lens  implant, bilateral Bilateral   . Ankle fracture surgery Left   . Abdominal hysterectomy     Social History:  reports that she has never smoked. She has never used smokeless tobacco. She reports that she does not drink alcohol or use illicit drugs.  No Known Allergies  Family History  Problem Relation Age of Onset  . CAD    . Hypertension    . Stroke    . Colon cancer Neg Hx   . GI Bleed Neg Hx     Prior to Admission medications   Medication Sig Start Date End Date Taking? Authorizing Provider  acetaminophen (TYLENOL) 325 MG tablet Take 650 mg by mouth every 6 (six) hours as needed (pain).   Yes Historical Provider, MD  ADVAIR DISKUS 500-50 MCG/DOSE AEPB Inhale 1 puff into the lungs every 12 (twelve) hours. 12/28/14  Yes Historical Provider, MD  atorvastatin (LIPITOR) 20 MG tablet Take 20 mg by mouth daily.   Yes Historical Provider, MD  carvedilol (COREG) 6.25 MG tablet Take 1 tablet (6.25 mg total) by mouth 2 (two) times daily with a meal. 03/04/15  Yes Geradine Girt, DO  cholecalciferol (VITAMIN D) 1000 UNITS tablet Take 1,000 Units by mouth daily at 6 PM.   Yes Historical Provider, MD  diclofenac sodium (VOLTAREN) 1 % GEL Apply 4 g topically 3 (  three) times daily.   Yes Historical Provider, MD  feeding supplement, ENSURE ENLIVE, (ENSURE ENLIVE) LIQD Take 237 mLs by mouth 2 (two) times daily between meals. 03/02/15  Yes Ripudeep Krystal Eaton, MD  ferrous sulfate 325 (65 FE) MG tablet Take 325 mg by mouth every morning.   Yes Historical Provider, MD  fluticasone (FLONASE) 50 MCG/ACT nasal spray Place 1 spray into both nostrils daily.   Yes Historical Provider, MD  furosemide (LASIX) 40 MG tablet Take 1 tablet (40 mg total) by mouth as needed for fluid or edema (heart failure). If more than 3 lb weight gain in 24 hrs give 1 tabs 03/02/15  Yes Ripudeep K Rai, MD  HYDROcodone-acetaminophen (NORCO/VICODIN) 5-325 MG per tablet Take 1 tablet by mouth every 4 (four) hours as needed (pain). 03/02/15   Yes Ripudeep Krystal Eaton, MD  levothyroxine (SYNTHROID, LEVOTHROID) 50 MCG tablet Take 50 mcg by mouth every morning.  01/16/15  Yes Historical Provider, MD  Omega 3 1000 MG CAPS Take 1,000 mg by mouth daily at 6 PM.    Yes Historical Provider, MD  oxymetazoline (AFRIN) 0.05 % nasal spray Place 1 spray into both nostrils 2 (two) times daily as needed (nose bleed). 03/02/15  Yes Ripudeep Krystal Eaton, MD  ranitidine (ZANTAC) 150 MG tablet Take 150 mg by mouth daily at 6 PM.  01/14/15  Yes Historical Provider, MD  aspirin EC 81 MG tablet Take 1 tablet (81 mg total) by mouth every morning. 03/03/15   Ripudeep K Rai, MD  capsicum (ZOSTRIX) 0.075 % topical cream Apply topically 2 (two) times daily. 03/02/15   Ripudeep Krystal Eaton, MD  ciprofloxacin (CIPRO) 500 MG tablet Take 1 tablet (500 mg total) by mouth 2 (two) times daily. Patient not taking: Reported on 03/18/2015 03/04/15   Geradine Girt, DO  pravastatin (PRAVACHOL) 20 MG tablet Take 20 mg by mouth daily at 6 PM.    Historical Provider, MD    Physical Exam: Filed Vitals:   03/18/15 0428 03/18/15 0430 03/18/15 0504 03/18/15 0550  BP: 143/65 148/62 152/56 163/81  Pulse: 83 83 85 73  Resp: 21 22 21 18   Height:    5\' 6"  (1.676 m)  Weight:    85.8 kg (189 lb 2.5 oz)  SpO2: 100% 100% 100% 100%    General: Alert, Awake and Oriented to Time, Place and Person. Appear in mild distress Eyes: PERRL ENT: Oral Mucosa clear moist. Neck: no JVD Cardiovascular: S1 and S2 Present, no Murmur, Peripheral Pulses Present Respiratory: Bilateral Air entry equal and Decreased,  Bilateral basal Crackles, no wheezes Abdomen: Bowel Sound present, Soft and non tender Skin: no Rash Extremities: Bilateral Pedal edema, no calf tenderness Neurologic: Grossly no focal neuro deficit.  Labs on Admission:  CBC:  Recent Labs Lab 03/18/15 0305  WBC 8.6  HGB 9.9*  HCT 32.8*  MCV 86.8  PLT 575*    CMP     Component Value Date/Time   NA 137 03/18/2015 0305   K 4.3 03/18/2015 0305     CL 103 03/18/2015 0305   CO2 23 03/18/2015 0305   GLUCOSE 167* 03/18/2015 0305   BUN 10 03/18/2015 0305   CREATININE 0.94 03/18/2015 0305   CALCIUM 9.4 03/18/2015 0305   PROT 7.4 03/02/2015 0528   ALBUMIN 2.7* 03/02/2015 0528   AST 15 03/02/2015 0528   ALT 9* 03/02/2015 0528   ALKPHOS 41 03/02/2015 0528   BILITOT 0.6 03/02/2015 0528   GFRNONAA 57* 03/18/2015 0305  GFRAA >60 03/18/2015 0305    No results for input(s): LIPASE, AMYLASE in the last 168 hours.  No results for input(s): CKTOTAL, CKMB, CKMBINDEX, TROPONINI in the last 168 hours. BNP (last 3 results)  Recent Labs  03/18/15 0305  BNP 1270.3*    ProBNP (last 3 results) No results for input(s): PROBNP in the last 8760 hours.   Radiological Exams on Admission: Dg Chest Port 1 View  03/18/2015   CLINICAL DATA:  Dyspnea, onset tonight  EXAM: PORTABLE CHEST - 1 VIEW  COMPARISON:  03/01/2015  FINDINGS: There is mild unchanged cardiomegaly. There are intact appearances of the transvenous leads.  There is new right base infiltrate and this may represent pneumonia. There is no large effusion. The left lung is clear.  IMPRESSION: Right base infiltrate.   Electronically Signed   By: Andreas Newport M.D.   On: 03/18/2015 04:09   EKG: Independently reviewed. normal sinus rhythm, nonspecific ST and T waves changes, LBBB.  Assessment/Plan Principal Problem:   Acute CHF Active Problems:   Anemia   HTN (hypertension)   DM (dermatomyositis)   Colostomy in place   COPD (chronic obstructive pulmonary disease)   Pulmonary infiltrate on chest x-ray   1. Acute CHF The patient presents with complaints of sudden onset of shortness of breath. Possible she has acute on chronic worsening of diastolic dysfunction. She initially presented with blood pressure above 119. She was started on nitroglycerin ointment. Blood pressure at present is improving. Since her symptoms are also improving at present. She has not received her  Lasix. I will give her one dose of IV Lasix. She was recently admitted for acute kidney injury therefore we will monitor her ins and outs as well as daily weight more closely and were not filled her on scheduled dose of Lasix. Follow serial troponin Echocardiogram In the morning.  2. Pulmonary infiltrate on the chest x-ray. Patient does not have any leukocytosis fever. Symptom onset is sudden. As likely this appears to be congestion. We will check a sputum culture as well as urine antigens. Holding off on antibiotics at present.  3. Hypertension and hypertensive urgency. Patient presents with accelerated hypertension as well as a heart failure. Most likely to be compensated pulmonary flash edema. Improving with current treatment. Continue with the home medication and would also add lisinopril as well as Imdur.  4. Hypothyroidism. Checking TSH. Continue serial levothyroxine.  Advance goals of care discussion: Full code   DVT Prophylaxis: subcutaneous Heparin Nutrition: Nothing by mouth medications  Family Communication: family was present at bedside, opportunity was given to ask question and all questions were answered satisfactorily at the time of interview. Disposition: Admitted as inpati step-down unit.  Author: Berle Mull, MD [facility] Pager: [phone] 03/18/2015  If 7PM-7AM, please contact night-coverage [email or web address] Password [email or web address]

## 2015-03-18 NOTE — Care Management Utilization Note (Signed)
Utilization review completed by Kayanna Mckillop N. Kabrea Seeney, RN BSN 

## 2015-03-18 NOTE — Progress Notes (Signed)
Spoke with Dr Meda Coffee and updated her on situation.   Unable to get IV access sufficient to do CT. Discussed with Dr Allyson Sabal and started heparin with plans to do VQ scan once MV dose washed out, Saturday. If VQ negative, need to address ischemia eval.  Dr Meda Coffee requests a PICC line so that the CT chest may be performed, meds administered.  Have ordered this. F/u in am.

## 2015-03-18 NOTE — Progress Notes (Signed)
Pt here for stress test. 2nd troponin mildly elevated, need additional values. D-dimer significantly elevated as well.   Spoke with Dr Meda Coffee. Will order stat CT chest to r/o PE. Continue to cycle enzymes, do stress test in am if CT is negative.   Rosaria Ferries, PA-C 03/18/2015 1:16 PM Beeper [phone]

## 2015-03-18 NOTE — Evaluation (Signed)
Clinical/Bedside Swallow Evaluation Patient Details  Name: [name] MRN: [id number] Date of Birth: 06-Feb-1934  Today's Date: 03/18/2015 Time: SLP Start Time (ACUTE ONLY): 0865 SLP Stop Time (ACUTE ONLY): 1556 SLP Time Calculation (min) (ACUTE ONLY): 13 min  Past Medical History:  Past Medical History  Diagnosis Date  . Hypertension   . Asthma   . CHF (congestive heart failure)   . COPD (chronic obstructive pulmonary disease)   . Colon cancer   . Hyperlipidemia   . Heart murmur   . Anginal pain   . Pulmonary embolism   . Pneumonia     "just once" (03/01/2015)  . Hypothyroidism   . Diabetes mellitus without complication     pt denies this hx on 03/01/2015  . History of blood transfusion     "related to anemia, this is my 1st" (03/01/2015)  . Arthritis     "comes and goes" (03/01/2015)  . Renal disorder   . Colostomy care    Past Surgical History:  Past Surgical History  Procedure Laterality Date  . Cholecystectomy    . Colon surgery      cancer  . Fracture surgery    . Dilation and curettage of uterus    . Tubal ligation    . Cataract extraction w/ intraocular lens  implant, bilateral Bilateral   . Ankle fracture surgery Left   . Abdominal hysterectomy     HPI:  [name] is a 81 y.o. female who presents with sudden onset SOB which woke her from her sleep. CXR shows possibl RLL infiltrate. PMH: hypertension, asthma, possible diastolic dysfunction, COPD, hypothyroidism, anemia, colostomy, chronic kidney disease.   Assessment / Plan / Recommendation Clinical Impression  Pt's oropharyngeal swallow appears WFL without overt indicators for dysphagia or aspiration. Recommend to initiate regular diet and thin liquids. SLP to sign off at this time - please re-order if we can be of assistance.    Aspiration Risk  Mild    Diet Recommendation Age appropriate regular solids;Thin   Medication Administration: Whole meds with liquid Compensations: Slow rate;Small  sips/bites    Other  Recommendations Oral Care Recommendations: Oral care BID   Follow Up Recommendations   N/A    Pertinent Vitals/Pain n/a    SLP Swallow Goals     Swallow Study Prior Functional Status       General Other Pertinent Information: [name] is a 81 y.o. female who presents with sudden onset SOB which woke her from her sleep. CXR shows possibl RLL infiltrate. PMH: hypertension, asthma, possible diastolic dysfunction, COPD, hypothyroidism, anemia, colostomy, chronic kidney disease. Type of Study: Bedside swallow evaluation Previous Swallow Assessment: none in chart Diet Prior to this Study: NPO Temperature Spikes Noted: No Respiratory Status: Room air History of Recent Intubation: No Behavior/Cognition: Alert;Cooperative;Pleasant mood Oral Cavity - Dentition: Adequate natural dentition/normal for age Self-Feeding Abilities: Able to feed self Patient Positioning: Upright in bed Baseline Vocal Quality: Normal    Oral/Motor/Sensory Function Overall Oral Motor/Sensory Function: Appears within functional limits for tasks assessed   Ice Chips Ice chips: Not tested   Thin Liquid Thin Liquid: Within functional limits Presentation: Self Fed;Straw    Nectar Thick Nectar Thick Liquid: Not tested   Honey Thick Honey Thick Liquid: Not tested   Puree Puree: Within functional limits Presentation: Self Fed;Spoon   Solid   Solid: Within functional limits Presentation: Self Fed      Germain Osgood, M.A. CCC-SLP [phone]  Germain Osgood 03/18/2015,4:05 PM

## 2015-03-18 NOTE — ED Notes (Addendum)
Son [name]) called and informed of patients arrival per pt request

## 2015-03-18 NOTE — Progress Notes (Addendum)
Patient has one IV and has heparin gtt infusing, Vancomycin and Heparin are not compatible per pharmacy. Baltazar Najjar NP notified of this issue .Marland Kitchen Patient has not been able to have a second IV obtained even by IV team. Zachary George RN

## 2015-03-18 NOTE — Consult Note (Addendum)
CARDIOLOGY CONSULT NOTE   Patient ID: [name] MRN: [id number], DOB/AGE: 08-Jan-1934   Admit date: 03/18/2015 Date of Consult: 03/18/2015  Primary Physician: No primary care provider on file. Primary Cardiologist: Serita Grammes  Reason for consult:  Chest pain  Problem List  Past Medical History  Diagnosis Date  . Hypertension   . Asthma   . CHF (congestive heart failure)   . COPD (chronic obstructive pulmonary disease)   . Colon cancer   . Hyperlipidemia   . Heart murmur   . Anginal pain   . Pulmonary embolism   . Pneumonia     "just once" (03/01/2015)  . Hypothyroidism   . Diabetes mellitus without complication     pt denies this hx on 03/01/2015  . History of blood transfusion     "related to anemia, this is my 1st" (03/01/2015)  . Arthritis     "comes and goes" (03/01/2015)  . Renal disorder   . Colostomy care     Past Surgical History  Procedure Laterality Date  . Cholecystectomy    . Colon surgery      cancer  . Fracture surgery    . Dilation and curettage of uterus    . Tubal ligation    . Cataract extraction w/ intraocular lens  implant, bilateral Bilateral   . Ankle fracture surgery Left   . Abdominal hysterectomy      Allergies  No Known Allergies  HPI   [name] is a 81 y.o. female with Past medical history of hypertension, asthma, ? Prior CHF managed by her PCP, COPD, hypothyroidism, anemia, colostomy, chronic kidney disease. There is FH of CHF in her grandmother. The patient presents with complaints of sudden onset of shortness of breath associated with diaphoresis, no chest pain. This happened while she was in bed.  She mentions that she was sleeping and suddenly woke up from her sleep as it felt hot.She denies any chest pain denies any cough denies any choking episode denies any fever denies any chills denies any diarrhea or constipation or abdominal pain or any focal deficit. She denies any changes in her medication.  She is  minimally active, she lives in assisted living community, never goes outside and has her food delivered to her room. She works with a physical therapist twice a day. She has had similar symptoms in the past but can't define how often.   Inpatient Medications  . antiseptic oral rinse  7 mL Mouth Rinse q12n4p  . aspirin EC  81 mg Oral Daily  . atorvastatin  20 mg Oral Daily  . carvedilol  6.25 mg Oral BID WC  . chlorhexidine  15 mL Mouth Rinse BID  . enoxaparin (LOVENOX) injection  40 mg Subcutaneous Q24H  . famotidine  20 mg Oral Daily  . feeding supplement (ENSURE ENLIVE)  237 mL Oral BID BM  . ferrous sulfate  325 mg Oral Q breakfast  . fluticasone  1 spray Each Nare Daily  . isosorbide mononitrate  30 mg Oral Daily  . levothyroxine  50 mcg Oral QAC breakfast  . lisinopril  5 mg Oral Daily  . mometasone-formoterol  2 puff Inhalation BID  . sodium chloride  3 mL Intravenous Q12H   Family History  Family History  Problem Relation Age of Onset  . CAD    . Hypertension    . Stroke    . Colon cancer Neg Hx   . GI Bleed Neg Hx  Social History History   Social History  . Marital Status: Divorced    Spouse Name: N/A  . Number of Children: N/A  . Years of Education: N/A   Occupational History  . Not on file.   Social History Main Topics  . Smoking status: Never Smoker   . Smokeless tobacco: Never Used  . Alcohol Use: No  . Drug Use: No  . Sexual Activity: No   Other Topics Concern  . Not on file   Social History Narrative    Review of Systems  General:  No chills, fever, night sweats or weight changes.  Cardiovascular:  No chest pain, dyspnea on exertion, edema, orthopnea, palpitations, paroxysmal nocturnal dyspnea. Dermatological: No rash, lesions/masses Respiratory: No cough, dyspnea Urologic: No hematuria, dysuria Abdominal:   No nausea, vomiting, diarrhea, bright red blood per rectum, melena, or hematemesis Neurologic:  No visual changes, wkns, changes  in mental status. All other systems reviewed and are otherwise negative except as noted above.  Physical Exam  Blood pressure 163/81, pulse 73, temperature 98.4 F (36.9 C), temperature source Oral, resp. rate 18, height 5\' 6"  (1.676 m), weight 189 lb 2.5 oz (85.8 kg), SpO2 100 %.  General: Pleasant, NAD Psych: Normal affect. Neuro: Alert and oriented X 3. Moves all extremities spontaneously. HEENT: Normal  Neck: Supple without bruits or JVD. Lungs:  Resp regular and unlabored, rales at both bases. Heart: RRR no s3, s4, or murmurs. Abdomen: Soft, non-tender, non-distended, BS + x 4.  Extremities: No clubbing, cyanosis or edema. DP/PT/Radials 2+ and equal bilaterally.  Labs  Recent Labs  03/18/15 0705  TROPONINI 0.05*   Lab Results  Component Value Date   WBC 8.6 03/18/2015   HGB 9.9* 03/18/2015   HCT 32.8* 03/18/2015   MCV 86.8 03/18/2015   PLT 575* 03/18/2015    Recent Labs Lab 03/18/15 0305  NA 137  K 4.3  CL 103  CO2 23  BUN 10  CREATININE 0.94  CALCIUM 9.4  GLUCOSE 167*   Radiology/Studies  Dg Chest Port 1 View  03/18/2015   CLINICAL DATA:  Dyspnea, onset tonight  EXAM: PORTABLE CHEST - 1 VIEW  COMPARISON:  03/01/2015  FINDINGS: There is mild unchanged cardiomegaly. There are intact appearances of the transvenous leads.  There is new right base infiltrate and this may represent pneumonia. There is no large effusion. The left lung is clear.  IMPRESSION: Right base infiltrate.      Echocardiogram:  None  ECG: SR, LBBB    ASSESSMENT AND PLAN  81 year old female with orthopnea, PND and SOB, LE edema  1. CHF - unknown etiology and unknown systolic/diastolic function - elevated BNP, LE edema and crackles on physical exam - I would start her on lasix 40 mg iv BID - order echocardiogram  2. Elevated troponin - minimal elevation - continue to trend troponin - baseline LBBB present a months ago, no prior ECGs - most probably secondary to CHF - order a  Lexiscan nuclear stress test to rule out ischemia - continue ASA, atorvastatin  3. Hypertension  - increase carvedilol to 12.5 mg po BID - agree with adding lisinopril 5 mg po daily  4. Hyperlipidemia - continue atorvastatin   Signed, Dorothy Spark, MD, [facility] 03/18/2015, 8:18 AM  Addendum:  D-dimer came back positive at 4.6, we will cancel the stress portion of the stress test and schedule a chest CTA. If negative for pulmonary embolism, we will complete the stress part of the stress  test tomorrow.   Dorothy Spark, MD, [facility] 03/18/2015

## 2015-03-18 NOTE — Progress Notes (Signed)
ABG attempted by two RT's. All attempts were unsuccessful. Dr. Colin Rhein was made aware.

## 2015-03-18 NOTE — Progress Notes (Addendum)
Patient seen and examined  Agree Georgianne Fick, MD assessment and plan  Subjective-patient is feeling better after receiving Lasix, does not want a Foley  Plan  1. CHF - unknown etiology and unknown systolic/diastolic function - elevated BNP, LE edema and crackles on physical exam Started on lasix 40 mg iv BID Echocardiogram ordered and pending  2. Elevated troponin - minimal elevation Patient also has an abnormal d-dimer, will rule out PE, order V/Q which may be done after the stress test - baseline LBBB present a months ago, no prior ECGs - most probably secondary to CHF Cardiology would like to order a Lexiscan nuclear stress test to rule out ischemia - continue ASA, atorvastatin  3. Hypertension  - increase carvedilol to 12.5 mg po BID - agree with adding lisinopril 5 mg po daily  4. Hyperlipidemia - continue atorvastatin  5.questionable pneumonia, repeat chest x-ray is pending until then the patient will be on empiric antiemetics, given location of the infiltrate will order speech therapy evaluation, to rule out aspiration

## 2015-03-18 NOTE — Progress Notes (Signed)
SLP Cancellation Note  Patient Details Name: BERT GIVANS MRN: [id number] DOB: July 26, 1934   Cancelled treatment:       Reason Eval/Treat Not Completed: Patient at procedure or test/unavailable   Germain Osgood, M.A. CCC-SLP [phone]  Germain Osgood 03/18/2015, 12:10 PM

## 2015-03-18 NOTE — ED Provider Notes (Signed)
CSN: [id number]     Arrival date & time 03/18/15  0256 History   None    This chart was scribed for Debby Freiberg, MD by Forrestine Him, ED Scribe. This patient was seen in room D34C/D34C and the patient's care was started 2:56 AM.   Chief Complaint  Patient presents with  . Shortness of Breath   The history is provided by the patient. No language interpreter was used.    HPI Comments: JENINA MOENING brought in by EMS from [facility] a 81 y.o. female with a PMHx of HTN, CHF, COPD, hyperlipidemia, DM, and pacemaker placement who presents to the Emergency Department complaining of constant, ongoing, worsening shortness of breath onset just prior to arrival. Per EMS pt woke from sleep this evening with shortness of breath and diaphoresis. En route to department, CPAP machine placed and 5 mg albuterol administered.. No recent fever, chills, chest pain, nausea, vomiting, or abdominal pain. Pt has been intubated in the past for same. No known allergies to medications.  Past Medical History  Diagnosis Date  . Hypertension   . Asthma   . CHF (congestive heart failure)   . COPD (chronic obstructive pulmonary disease)   . Colon cancer   . Hyperlipidemia   . Heart murmur   . Anginal pain   . Pulmonary embolism   . Pneumonia     "just once" (03/01/2015)  . Hypothyroidism   . Diabetes mellitus without complication     pt denies this hx on 03/01/2015  . History of blood transfusion     "related to anemia, this is my 1st" (03/01/2015)  . Arthritis     "comes and goes" (03/01/2015)  . Renal disorder   . Colostomy care    Past Surgical History  Procedure Laterality Date  . Cholecystectomy    . Colon surgery      cancer  . Fracture surgery    . Dilation and curettage of uterus    . Tubal ligation    . Cataract extraction w/ intraocular lens  implant, bilateral Bilateral   . Ankle fracture surgery Left   . Abdominal hysterectomy     Family History  Problem Relation Age of Onset  . CAD     . Hypertension    . Stroke    . Colon cancer Neg Hx   . GI Bleed Neg Hx    History  Substance Use Topics  . Smoking status: Never Smoker   . Smokeless tobacco: Never Used  . Alcohol Use: No   OB History    No data available     Review of Systems  Constitutional: Positive for diaphoresis. Negative for fever and chills.  Respiratory: Positive for shortness of breath. Negative for cough.   Cardiovascular: Negative for chest pain.  Gastrointestinal: Negative for nausea, vomiting and abdominal pain.  All other systems reviewed and are negative.     Allergies  Review of patient's allergies indicates no known allergies.  Home Medications   Prior to Admission medications   Medication Sig Start Date End Date Taking? Authorizing Provider  acetaminophen (TYLENOL) 325 MG tablet Take 650 mg by mouth every 6 (six) hours as needed (pain).    Historical Provider, MD  ADVAIR DISKUS 500-50 MCG/DOSE AEPB Inhale 1 puff into the lungs every 12 (twelve) hours. 12/28/14   Historical Provider, MD  aspirin EC 81 MG tablet Take 1 tablet (81 mg total) by mouth every morning. 03/03/15   Ripudeep Krystal Eaton, MD  capsicum (  ZOSTRIX) 0.075 % topical cream Apply topically 2 (two) times daily. 03/02/15   Ripudeep Krystal Eaton, MD  carvedilol (COREG) 6.25 MG tablet Take 1 tablet (6.25 mg total) by mouth 2 (two) times daily with a meal. 03/04/15   Geradine Girt, DO  cholecalciferol (VITAMIN D) 1000 UNITS tablet Take 1,000 Units by mouth daily at 6 PM.    Historical Provider, MD  ciprofloxacin (CIPRO) 500 MG tablet Take 1 tablet (500 mg total) by mouth 2 (two) times daily. 03/04/15   Geradine Girt, DO  diclofenac sodium (VOLTAREN) 1 % GEL Apply 4 g topically 3 (three) times daily.    Historical Provider, MD  feeding supplement, ENSURE ENLIVE, (ENSURE ENLIVE) LIQD Take 237 mLs by mouth 2 (two) times daily between meals. 03/02/15   Ripudeep Krystal Eaton, MD  ferrous sulfate 325 (65 FE) MG tablet Take 325 mg by mouth every morning.     Historical Provider, MD  fluticasone (FLONASE) 50 MCG/ACT nasal spray Place 1 spray into both nostrils daily.    Historical Provider, MD  furosemide (LASIX) 40 MG tablet Take 1 tablet (40 mg total) by mouth as needed for fluid or edema (heart failure). If more than 3 lb weight gain in 24 hrs give 1 tabs 03/02/15   Ripudeep Krystal Eaton, MD  HYDROcodone-acetaminophen (NORCO/VICODIN) 5-325 MG per tablet Take 1 tablet by mouth every 4 (four) hours as needed (pain). 03/02/15   Ripudeep Krystal Eaton, MD  levothyroxine (SYNTHROID, LEVOTHROID) 50 MCG tablet Take 50 mcg by mouth every morning.  01/16/15   Historical Provider, MD  Omega 3 1000 MG CAPS Take 1,000 mg by mouth daily at 6 PM.     Historical Provider, MD  oxymetazoline (AFRIN) 0.05 % nasal spray Place 1 spray into both nostrils 2 (two) times daily as needed (nose bleed). 03/02/15   Ripudeep Krystal Eaton, MD  pravastatin (PRAVACHOL) 20 MG tablet Take 20 mg by mouth daily at 6 PM.    Historical Provider, MD  ranitidine (ZANTAC) 150 MG tablet Take 150 mg by mouth daily at 6 PM.  01/14/15   Historical Provider, MD   Triage Vitals: BP 148/62 mmHg  Pulse 83  Resp 22  SpO2 100%   Physical Exam  Constitutional: She is oriented to person, place, and time. She appears well-developed and well-nourished.  HENT:  Head: Normocephalic and atraumatic.  Right Ear: External ear normal.  Left Ear: External ear normal.  Eyes: Conjunctivae and EOM are normal. Pupils are equal, round, and reactive to light.  Neck: Normal range of motion. Neck supple.  Cardiovascular: Normal rate, regular rhythm, normal heart sounds and intact distal pulses.   Pulmonary/Chest: Effort normal. She has wheezes (diffuse). She has rales in the right middle field, the right lower field, the left middle field and the left lower field.  Abdominal: Soft. Bowel sounds are normal. There is no tenderness.  Musculoskeletal: Normal range of motion.  Neurological: She is alert and oriented to person, place, and time.   Skin: Skin is warm and dry.  Vitals reviewed.   ED Course  Procedures (including critical care time)  DIAGNOSTIC STUDIES: Oxygen Saturation is 100% on cpap, adequate by my interpretation.    COORDINATION OF CARE: 3:57 AM- Will give albuterol and Nitroglycerin. Will order EKG, CBC, BNP, i-stat troponin, CBC, and BMP. Discussed treatment plan with pt at bedside and pt agreed to plan.     Labs Review Labs Reviewed  CBC - Abnormal; Notable for the following:  RBC 3.78 (*)    Hemoglobin 9.9 (*)    HCT 32.8 (*)    RDW 16.8 (*)    Platelets 575 (*)    All other components within normal limits  BASIC METABOLIC PANEL - Abnormal; Notable for the following:    Glucose, Bld 167 (*)    GFR calc non Af Amer 57 (*)    All other components within normal limits  BRAIN NATRIURETIC PEPTIDE - Abnormal; Notable for the following:    B Natriuretic Peptide 1270.3 (*)    All other components within normal limits  BLOOD GAS, ARTERIAL  I-STAT TROPOININ, ED    Imaging Review Dg Chest Port 1 View  03/18/2015   CLINICAL DATA:  Dyspnea, onset tonight  EXAM: PORTABLE CHEST - 1 VIEW  COMPARISON:  03/01/2015  FINDINGS: There is mild unchanged cardiomegaly. There are intact appearances of the transvenous leads.  There is new right base infiltrate and this may represent pneumonia. There is no large effusion. The left lung is clear.  IMPRESSION: Right base infiltrate.   Electronically Signed   By: Andreas Newport M.D.   On: 03/18/2015 04:09     EKG Interpretation   Date/Time:  Thursday March 18 2015 03:09:26 EDT Ventricular Rate:  96 PR Interval:  206 QRS Duration: 160 QT Interval:  402 QTC Calculation: 508 R Axis:   93 Text Interpretation:  Sinus rhythm Nonspecific intraventricular conduction  delay Anterior infarct, acute (LAD) Lateral leads are also involved No  significant change since last tracing Confirmed by Debby Freiberg [phone])  on 03/18/2015 4:09:22 AM       CRITICAL CARE Performed  by: Debby Freiberg   Total critical care time: 35 min  Critical care time was exclusive of separately billable procedures and treating other patients.  Critical care was necessary to treat or prevent imminent or life-threatening deterioration.  Critical care was time spent personally by me on the following activities: development of treatment plan with patient and/or surrogate as well as nursing, discussions with consultants, evaluation of patient's response to treatment, examination of patient, obtaining history from patient or surrogate, ordering and performing treatments and interventions, ordering and review of laboratory studies, ordering and review of radiographic studies, pulse oximetry and re-evaluation of patient's condition.   MDM   Final diagnoses:  Acute respiratory failure, unspecified whether with hypoxia or hypercapnia  Acute exacerbation of CHF (congestive heart failure)    81 y.o. female with pertinent PMH of COPD, CHF (unclear if systolic or diastolic), recent admission for AKI and anemia presents with dyspnea as above.  Exam with likely CHF exacerbation, however possible COPD exacerbation.  Initial exam and history unlikely for PNA (no fevers, cough, or other symptoms), however history is limited by ho dementia.  Placed on bipap and nitro drip on arrival.  Elba Barman with likely CHF exacerbation, discussed possible abx and will defer given lack of clear history.  Admitted in stable condition.    I have reviewed all laboratory and imaging studies if ordered as above  1. Acute respiratory failure, unspecified whether with hypoxia or hypercapnia   2. Acute exacerbation of CHF (congestive heart failure)           Debby Freiberg, MD 03/18/15 [phone]

## 2015-03-18 NOTE — Progress Notes (Signed)
Pt. Was transported to 3W16 without any complications.

## 2015-03-18 NOTE — Progress Notes (Signed)
Received call from [place] NP and order to hold Vancomycin until Picc line obtained. [facility] [employer]

## 2015-03-18 NOTE — Progress Notes (Signed)
VASCULAR LAB PRELIMINARY  PRELIMINARY  PRELIMINARY  PRELIMINARY  Bilateral lower extremity venous duplex  completed.    Preliminary report:  Bilateral:  No evidence of DVT, superficial thrombosis, or Baker's Cyst.    Jessee Mezera, RVT 03/18/2015, 4:50 PM

## 2015-03-19 ENCOUNTER — Other Ambulatory Visit (HOSPITAL_COMMUNITY): Payer: Medicare HMO

## 2015-03-19 ENCOUNTER — Inpatient Hospital Stay (HOSPITAL_COMMUNITY): Payer: Medicare HMO

## 2015-03-19 ENCOUNTER — Encounter (HOSPITAL_COMMUNITY): Payer: Self-pay | Admitting: Radiology

## 2015-03-19 DIAGNOSIS — I5021 Acute systolic (congestive) heart failure: Secondary | ICD-10-CM

## 2015-03-19 LAB — CBC
HEMATOCRIT: 26.7 % — AB (ref 36.0–46.0)
Hemoglobin: 8.1 g/dL — ABNORMAL LOW (ref 12.0–15.0)
MCH: 26 pg (ref 26.0–34.0)
MCHC: 30.3 g/dL (ref 30.0–36.0)
MCV: 85.9 fL (ref 78.0–100.0)
Platelets: 321 10*3/uL (ref 150–400)
RBC: 3.11 MIL/uL — ABNORMAL LOW (ref 3.87–5.11)
RDW: 17 % — ABNORMAL HIGH (ref 11.5–15.5)
WBC: 5.4 10*3/uL (ref 4.0–10.5)

## 2015-03-19 LAB — HEPARIN LEVEL (UNFRACTIONATED)
HEPARIN UNFRACTIONATED: 0.67 [IU]/mL (ref 0.30–0.70)
HEPARIN UNFRACTIONATED: 0.58 [IU]/mL (ref 0.30–0.70)

## 2015-03-19 LAB — TROPONIN I: TROPONIN I: 0.03 ng/mL (ref <0.031)

## 2015-03-19 MED ORDER — SODIUM CHLORIDE 0.9 % IJ SOLN: 10.0000 mL | INTRAMUSCULAR | Status: DC | PRN

## 2015-03-19 MED ORDER — IOHEXOL 350 MG/ML SOLN
80.0000 mL | Freq: Once | INTRAVENOUS | Status: AC | PRN
  Administered 2015-03-19: 80 mL via INTRAVENOUS

## 2015-03-19 MED ORDER — SODIUM CHLORIDE 0.9 % IJ SOLN: 10.0000 mL | Freq: Two times a day (BID) | INTRAMUSCULAR | Status: DC

## 2015-03-19 NOTE — Progress Notes (Addendum)
[facility] PROGRESS NOTE  [name] MBW:[id number] DOB: [birth year] DOA: 03/18/2015 PCP: No primary care provider on file.  Assessment/Plan: Principal Problem:   Acute CHF Active Problems:   Anemia   HTN (hypertension)   DM (dermatomyositis)   Colostomy in place   COPD (chronic obstructive pulmonary disease)   Pulmonary infiltrate on chest x-ray   Acute respiratory failure, unspecified whether with hypoxia or hypercapnia       CHF - unknown etiology and unknown systolic/diastolic function,  - elevated BNP, LE edema and crackles on physical exam improving Continue Lasix 40 mg IV twice a day Echocardiogram ordered and pending  Abnormal d-dimer PICC line to be placed for CT angiogram of the chest Venous Doppler negative  Anemia of chronic disease, hemoglobin will be rechecked no active bleeding  Abnormal troponin - nondiagnostic per cardiology, awaiting results of the nuclear study and CT angiogram Patient also has an abnormal d-dimer, will rule out PE, order V/Q which may be done after the stress test - baseline LBBB present a months ago, no prior ECGs - most probably secondary to CHF - continue ASA, atorvastatin Pacemaker interrogation by cardiology   Hypertension  Improving, continue Coreg Lasix and lisinopril   Hyperlipidemia - continue atorvastatin   questionable pneumonia, repeat chest x-ray shows improvement in right lower lobe infiltrate, until then the patient will be on empiric antiemetics, given location of the infiltrate will order speech therapy evaluation, to rule out aspiration as  Code Status: full Family Communication: family updated about patient's clinical progress Disposition Plan:  As above    Brief narrative: 81 y.o. female with Past medical history of hypertension, asthma, ? Prior CHF managed by her PCP, COPD, hypothyroidism, anemia, colostomy, chronic kidney disease. There is FH of CHF in her grandmother. The patient presents with  complaints of sudden onset of shortness of breath associated with diaphoresis, no chest pain. This happened while she was in bed.  She mentions that she was sleeping and suddenly woke up from her sleep as it felt hot.She denies any chest pain denies any cough denies any choking episode denies any fever denies any chills denies any diarrhea or constipation or abdominal pain or any focal deficit. She denies any changes in her medication.  She is minimally active, she lives in assisted living community, never goes outside and has her food delivered to her room. She works with a physical therapist twice a day. She has had similar symptoms in the past but can't define how often.   Consultants:  Cardiology  Procedures:  None  Antibiotics: Anti-infectives    Start     Dose/Rate Route Frequency Ordered Stop   03/19/15 0200  vancomycin (VANCOCIN) IVPB 750 mg/150 ml premix     750 mg 150 mL/hr over 60 Minutes Intravenous Every 12 hours 03/18/15 1318     03/18/15 1330  vancomycin (VANCOCIN) 1,500 mg in sodium chloride 0.9 % 500 mL IVPB     1,500 mg 250 mL/hr over 120 Minutes Intravenous  Once 03/18/15 1319 03/18/15 1622   03/18/15 1000  piperacillin-tazobactam (ZOSYN) IVPB 3.375 g     3.375 g 12.5 mL/hr over 240 Minutes Intravenous Every 8 hours 03/18/15 0915     03/18/15 1000  vancomycin (VANCOCIN) 1,500 mg in sodium chloride 0.9 % 500 mL IVPB  Status:  Discontinued     1,500 mg 250 mL/hr over 120 Minutes Intravenous  Once 03/18/15 0915 03/18/15 1319  HPI/Subjective: Denies chest pain, still has some shortness of breath with exertion  Objective: Filed Vitals:   03/18/15 2359 03/19/15 0500 03/19/15 0800 03/19/15 1039  BP: 121/57 127/56 153/61 145/64  Pulse: 74 73 87   Temp: 98 F (36.7 C) 98.4 F (36.9 C) 98.4 F (36.9 C)   TempSrc: Oral Oral Oral   Resp:      Height:  5\' 6"  (1.676 m)    Weight:  86.8 kg (191 lb 5.8 oz)    SpO2: 100% 100% 100%     Intake/Output  Summary (Last 24 hours) at 03/19/15 1213 Last data filed at 03/19/15 0839  Gross per 24 hour  Intake 548.88 ml  Output    250 ml  Net 298.88 ml    Exam: General appearance: alert, cooperative and no distress Neck: No JVD Lungs: Basilar crackles. No wheeze Heart: regular rate and rhythm Abdomen: +BS, nontender Extremities: No pitting edema Pulses: 2+ and symmetric Skin: Warm and dry Neurologic: Grossly normal     Data Review   Micro Results Recent Results (from the past 240 hour(s))  MRSA PCR Screening     Status: None   Collection Time: 03/18/15  6:06 AM  Result Value Ref Range Status   MRSA by PCR NEGATIVE NEGATIVE Final    Comment:        The GeneXpert MRSA Assay (FDA approved for NASAL specimens only), is one component of a comprehensive MRSA colonization surveillance program. It is not intended to diagnose MRSA infection nor to guide or monitor treatment for MRSA infections.   Culture, blood (routine x 2)     Status: None (Preliminary result)   Collection Time: 03/18/15  7:05 AM  Result Value Ref Range Status   Specimen Description BLOOD RIGHT ANTECUBITAL  Final   Special Requests BOTTLES DRAWN AEROBIC ONLY 10CC  Final   Culture   Final           BLOOD CULTURE RECEIVED NO GROWTH TO DATE CULTURE WILL BE HELD FOR 5 DAYS BEFORE ISSUING A FINAL NEGATIVE REPORT Performed at [facility]    Report Status PENDING  Incomplete  Culture, blood (routine x 2)     Status: None (Preliminary result)   Collection Time: 03/18/15  7:20 AM  Result Value Ref Range Status   Specimen Description BLOOD LEFT ANTECUBITAL  Final   Special Requests BOTTLES DRAWN AEROBIC ONLY 10CC  Final   Culture   Final           BLOOD CULTURE RECEIVED NO GROWTH TO DATE CULTURE WILL BE HELD FOR 5 DAYS BEFORE ISSUING A FINAL NEGATIVE REPORT Performed at [facility]    Report Status PENDING  Incomplete    Radiology Reports Dg Chest 2 View  03/18/2015   CLINICAL DATA:  Followup  right lower lobe pneumonia. Congestive heart failure. COPD.  EXAM: CHEST  2 VIEW  COMPARISON:  Prior today on 03/18/2015  FINDINGS: Cardiomegaly is stable. Dual lead transvenous pacemaker remains in appropriate position.  There is mild improvement in airspace opacity in the right lower lobe since previous study. Diffuse pulmonary vascular congestion is demonstrated, without evidence of frank pulmonary edema.  IMPRESSION: Mild improvement in airspace opacity in right lower lobe since prior exam.  Stable cardiomegaly and diffuse pulmonary vascular congestion.   Electronically Signed   By: Earle Gell M.D.   On: 03/18/2015 17:43   Dg Chest Port 1 View  03/18/2015   CLINICAL DATA:  Dyspnea, onset tonight  EXAM:  PORTABLE CHEST - 1 VIEW  COMPARISON:  03/01/2015  FINDINGS: There is mild unchanged cardiomegaly. There are intact appearances of the transvenous leads.  There is new right base infiltrate and this may represent pneumonia. There is no large effusion. The left lung is clear.  IMPRESSION: Right base infiltrate.   Electronically Signed   By: Andreas Newport M.D.   On: 03/18/2015 04:09   Dg Chest Port 1 View  03/01/2015   CLINICAL DATA:  Sudden onset of fever today.  EXAM: PORTABLE CHEST - 1 VIEW  COMPARISON:  02/13/2009.  FINDINGS: Cardiac silhouette mildly enlarged. No mediastinal hilar masses. Left anterior chest wall sequential pacemaker leads project over the right atrium right ventricle.  No lung consolidation or edema. No pleural effusion or pneumothorax.  Bony thorax is demineralized but grossly intact.  IMPRESSION: No acute cardiopulmonary disease.   Electronically Signed   By: Lajean Manes M.D.   On: 03/01/2015 21:20     CBC  Recent Labs Lab 03/18/15 0305 03/19/15 0235  WBC 8.6 5.4  HGB 9.9* 8.1*  HCT 32.8* 26.7*  PLT 575* 321  MCV 86.8 85.9  MCH 26.2 26.0  MCHC 30.2 30.3  RDW 16.8* 17.0*    Chemistries   Recent Labs Lab 03/18/15 0305  NA 137  K 4.3  CL 103  CO2 23   GLUCOSE 167*  BUN 10  CREATININE 0.94  CALCIUM 9.4   ------------------------------------------------------------------------------------------------------------------ estimated creatinine clearance is 54.7 mL/min (by C-G formula based on Cr of 0.94). ------------------------------------------------------------------------------------------------------------------ No results for input(s): HGBA1C in the last 72 hours. ------------------------------------------------------------------------------------------------------------------ No results for input(s): CHOL, HDL, LDLCALC, TRIG, CHOLHDL, LDLDIRECT in the last 72 hours. ------------------------------------------------------------------------------------------------------------------  Recent Labs  03/18/15 0705  TSH 2.233   ------------------------------------------------------------------------------------------------------------------ No results for input(s): VITAMINB12, FOLATE, FERRITIN, TIBC, IRON, RETICCTPCT in the last 72 hours.  Coagulation profile No results for input(s): INR, PROTIME in the last 168 hours.   Recent Labs  03/18/15 1045  DDIMER 4.63*    Cardiac Enzymes  Recent Labs Lab 03/18/15 1428 03/18/15 2008 03/19/15 0235  TROPONINI 0.04* 0.04* 0.03   ------------------------------------------------------------------------------------------------------------------ Invalid input(s): POCBNP   CBG: No results for input(s): GLUCAP in the last 168 hours.     Studies: Dg Chest 2 View  03/18/2015   CLINICAL DATA:  Followup right lower lobe pneumonia. Congestive heart failure. COPD.  EXAM: CHEST  2 VIEW  COMPARISON:  Prior today on 03/18/2015  FINDINGS: Cardiomegaly is stable. Dual lead transvenous pacemaker remains in appropriate position.  There is mild improvement in airspace opacity in the right lower lobe since previous study. Diffuse pulmonary vascular congestion is demonstrated, without evidence of frank  pulmonary edema.  IMPRESSION: Mild improvement in airspace opacity in right lower lobe since prior exam.  Stable cardiomegaly and diffuse pulmonary vascular congestion.   Electronically Signed   By: Earle Gell M.D.   On: 03/18/2015 17:43   Dg Chest Port 1 View  03/18/2015   CLINICAL DATA:  Dyspnea, onset tonight  EXAM: PORTABLE CHEST - 1 VIEW  COMPARISON:  03/01/2015  FINDINGS: There is mild unchanged cardiomegaly. There are intact appearances of the transvenous leads.  There is new right base infiltrate and this may represent pneumonia. There is no large effusion. The left lung is clear.  IMPRESSION: Right base infiltrate.   Electronically Signed   By: Andreas Newport M.D.   On: 03/18/2015 04:09      No results found for: HGBA1C Lab Results  Component Value Date  [place]  02/11/2009    91        Total Cholesterol/HDL:CHD Risk Coronary Heart Disease Risk Table                     Men   Women  1/2 Average Risk   3.4   3.3  Average Risk       5.0   4.4  2 X Average Risk   9.6   7.1  3 X Average Risk  23.4   11.0        Use the calculated Patient Ratio above and the CHD Risk Table to determine the patient's CHD Risk.        ATP III CLASSIFICATION (LDL):  <100     mg/dL   Optimal  100-129  mg/dL   Near or Above                    Optimal  130-159  mg/dL   Borderline  160-189  mg/dL   High  >190     mg/dL   Very High   CREATININE 0.94 03/18/2015       Scheduled Meds: . antiseptic oral rinse  7 mL Mouth Rinse q12n4p  . aspirin EC  81 mg Oral Daily  . atorvastatin  20 mg Oral Daily  . carvedilol  12.5 mg Oral BID WC  . chlorhexidine  15 mL Mouth Rinse BID  . famotidine  20 mg Oral Daily  . feeding supplement (ENSURE ENLIVE)  237 mL Oral BID BM  . ferrous sulfate  325 mg Oral Q breakfast  . fluticasone  1 spray Each Nare Daily  . furosemide  40 mg Intravenous BID  . isosorbide mononitrate  30 mg Oral Daily  . levothyroxine  50 mcg Oral QAC breakfast  . lisinopril  5 mg  Oral Daily  . mometasone-formoterol  2 puff Inhalation BID  . piperacillin-tazobactam (ZOSYN)  IV  3.375 g Intravenous Q8H  . regadenoson  0.4 mg Intravenous Once  . sodium chloride  3 mL Intravenous Q12H  . vancomycin  750 mg Intravenous Q12H   Continuous Infusions: . heparin 1,300 Units/hr (03/19/15 1034)    Principal Problem:   Acute CHF Active Problems:   Anemia   HTN (hypertension)   DM (dermatomyositis)   Colostomy in place   COPD (chronic obstructive pulmonary disease)   Pulmonary infiltrate on chest x-ray   Acute respiratory failure, unspecified whether with hypoxia or hypercapnia    Time spent: 45 minutes   [facility] Pager [phone]. If 7PM-7AM, please contact night-coverage at [email or web address], password [facility] 03/19/2015, 12:13 PM  LOS: 1 day

## 2015-03-19 NOTE — Progress Notes (Signed)
[place] for heparin Indication: rule out PE  No Known Allergies  Patient Measurements: Height: 5\' 6"  (167.6 cm) Weight: 189 lb 2.5 oz (85.8 kg) IBW/kg (Calculated) : 59.3 Heparin Dosing Weight: 85kg  Vital Signs: Temp: 98 F (36.7 C) (06/09 2359) Temp Source: Oral (06/09 2359) BP: 121/57 mmHg (06/09 2359) Pulse Rate: 74 (06/09 2359)  Labs:  Recent Labs  03/18/15 0305 03/18/15 0705 03/18/15 1428 03/18/15 2008 03/19/15 0235  HGB 9.9*  --   --   --  8.1*  HCT 32.8*  --   --   --  26.7*  PLT 575*  --   --   --  321  HEPARINUNFRC  --   --   --   --  0.67  CREATININE 0.94  --   --   --   --   TROPONINI  --  0.05* 0.04* 0.04*  --     Estimated Creatinine Clearance: 54.4 mL/min (by C-G formula based on Cr of 0.94).  Assessment: 81 y.o. female with possible PE for heparin  Goal of Therapy:  Heparin level 0.3-0.7 units/ml Monitor platelets by anticoagulation protocol: Yes   Plan:  Continue Heparin at current rate for now Recheck level in 6 hrs to verify  Phillis Knack, PharmD, BCPS  03/19/2015 3:13 AM

## 2015-03-19 NOTE — Clinical Social Work Note (Signed)
Clinical Social Work Assessment  Patient Details  Name: [name] MRN: [id number] Date of Birth: August 22, 1934  Date of referral:  03/19/15               Reason for consult:  Other (Comment Required) (From a facility)                Permission sought to share information with:  Case Manager, Facility Sport and exercise psychologist, Family Supports Permission granted to share information::  Yes, Verbal Permission Granted  Name::     Patient son [name]   Agency::  [facility]  Relationship::     Contact Information:     [phone]  Housing/Transportation Living arrangements for the past 2 months:  [place] of Information:  Patient, Medical Team, Adult Children Patient Interpreter Needed:  None Criminal Activity/Legal Involvement Pertinent to Current Situation/Hospitalization:  No - Comment as needed Significant Relationships:  Adult Children Lives with:  Facility Resident Do you feel safe going back to the place where you live?  Yes Need for family participation in patient care:  Yes (Comment)  Care giving concerns:  No care giving concerns at this time. Patient resides at a facility   Facilities manager / plan:  LCSW aware of consult and referral. Attempted to be seen by weekday CSW, however due to patient sleeping, covering CSW providing follow up.  Patient is a current resident at [facility] where she was placed from [facility].  Please see detailed assessment note from admission of 5/26.    Patient plans to return to SNF at [place]. Has family support from son: [name] who is also agreeable for patient to return for continued rehab.   Employment status:  Retired Nurse, adult PT Recommendations:  [place] / Referral to community resources:  Other (Comment Required) (None needed at this time)  Patient/Family's Response to care:  Agreeable to Plan  Patient/Family's Understanding of and  Emotional Response to Diagnosis, Current Treatment, and Prognosis:  Remains optimistic and engaged in treatment however at times unrealistic about current medical state and needs.  Emotional Assessment Appearance:  Appears stated age Attitude/Demeanor/Rapport:  Other (Cooperative) Affect (typically observed):  Accepting, Stable Orientation:  Oriented to Self, Oriented to Place Alcohol / Substance use:  Not Applicable Psych involvement (Current and /or in the community):  No (Comment)  Discharge Needs  Concerns to be addressed:  No discharge needs identified Readmission within the last 30 days:  Yes Current discharge risk:  None Barriers to Discharge:  Continued Medical Work up   [place], LCSW 03/19/2015, 8:11 PM

## 2015-03-19 NOTE — Progress Notes (Signed)
Peripherally Inserted Central Catheter/Midline Placement  The IV Nurse has discussed with the patient and/or persons authorized to consent for the patient, the purpose of this procedure and the potential benefits and risks involved with this procedure.  The benefits include less needle sticks, lab draws from the catheter and patient may be discharged home with the catheter.  Risks include, but not limited to, infection, bleeding, blood clot (thrombus formation), and puncture of an artery; nerve damage and irregular heat beat.  Alternatives to this procedure were also discussed.  PICC/Midline Placement Documentation  PICC / Midline Single Lumen 03/19/15 PICC Right Brachial 40 cm 0 cm (Active)  Indication for Insertion or Continuance of Line Poor Vasculature-patient has had multiple peripheral attempts or PIVs lasting less than 24 hours 03/19/2015 11:00 AM  Exposed Catheter (cm) 0 cm 03/19/2015 11:00 AM  Dressing Change Due 03/26/15 03/19/2015 11:00 AM       Jule Economy Horton 03/19/2015, 11:36 AM

## 2015-03-19 NOTE — Progress Notes (Signed)
Patient has not voided this shift. Patient denied the urge to void when asked. Bladder scan performed and patient has 234cc in bladder. At that time patient states she needs to void and ask to be placed on bedpan and left alone for a while, due to she feels she needs to void,but needs privacy to do so. [facility] Rn

## 2015-03-19 NOTE — Progress Notes (Signed)
Subjective: Breathing better  Objective: Vital signs in last 24 hours: Temp:  [98 F (36.7 C)-98.4 F (36.9 C)] 98.4 F (36.9 C) (06/10 0800) Pulse Rate:  [73-87] 87 (06/10 0800) BP: (103-176)/(56-64) 153/61 mmHg (06/10 0800) SpO2:  [100 %] 100 % (06/10 0800) Weight:  [191 lb 5.8 oz (86.8 kg)] 191 lb 5.8 oz (86.8 kg) (06/10 0500) Last BM Date: 03/18/15  Intake/Output from previous day: 06/09 0701 - 06/10 0700 In: 308.9 [P.O.:120; I.V.:138.9; IV Piggyback:50] Out: 350 [Urine:350] Intake/Output this shift: Total I/O In: 240 [P.O.:240] Out: 100 [Urine:100]  Medications Scheduled Meds: . antiseptic oral rinse  7 mL Mouth Rinse q12n4p  . aspirin EC  81 mg Oral Daily  . atorvastatin  20 mg Oral Daily  . carvedilol  12.5 mg Oral BID WC  . chlorhexidine  15 mL Mouth Rinse BID  . famotidine  20 mg Oral Daily  . feeding supplement (ENSURE ENLIVE)  237 mL Oral BID BM  . ferrous sulfate  325 mg Oral Q breakfast  . fluticasone  1 spray Each Nare Daily  . furosemide  40 mg Intravenous BID  . isosorbide mononitrate  30 mg Oral Daily  . levothyroxine  50 mcg Oral QAC breakfast  . lisinopril  5 mg Oral Daily  . mometasone-formoterol  2 puff Inhalation BID  . piperacillin-tazobactam (ZOSYN)  IV  3.375 g Intravenous Q8H  . regadenoson  0.4 mg Intravenous Once  . sodium chloride  3 mL Intravenous Q12H  . vancomycin  750 mg Intravenous Q12H   Continuous Infusions: . heparin 1,300 Units/hr (03/18/15 1819)   PRN Meds:.sodium chloride, acetaminophen, HYDROcodone-acetaminophen, ondansetron (ZOFRAN) IV, sodium chloride  PE: General appearance: alert, cooperative and no distress Neck: No JVD Lungs: Basilar crackles. No wheeze Heart: regular rate and rhythm Abdomen: +BS, nontender Extremities: No pitting edema Pulses: 2+ and symmetric Skin: Warm and dry Neurologic: Grossly normal  Lab Results:   Recent Labs  03/18/15 0305 03/19/15 0235  WBC 8.6 5.4  HGB 9.9* 8.1*  HCT  32.8* 26.7*  PLT 575* 321   BMET  Recent Labs  03/18/15 0305  NA 137  K 4.3  CL 103  CO2 23  GLUCOSE 167*  BUN 10  CREATININE 0.94  CALCIUM 9.4     Assessment/Plan 81 y.o. female with Past medical history of hypertension, asthma, ? Prior CHF managed by her PCP, COPD, hypothyroidism, anemia, colostomy, chronic kidney disease. She presented with complaints of sudden onset of shortness of breath associated with diaphoresis, no chest pain.  Principal Problem:   Acute CHF  She is breathing much better  Net fluids:  -24ml. Not accurate due to incontinence.  Getting lasix 40mg  IV BID.   CMP pending. CXR Mild improvement in airspace opacity in right lower lobe since prior exam.  Stable cardiomegaly and diffuse pulmonary vascular congestion. Echo pending CT angio pending for elevated Ddimer.   Lexiscan completed but no results yet. No DVT on dopplers Will review CMP before giving lasix tonight   Anemia  Hgb dropped from 9.9 to 8.1.  ? Accuracy.      HTN (hypertension)  Labile.  Continue to monitor.  Coreg 12.5 BID, lisinopril 5QD   DM (dermatomyositis)   Colostomy in place   COPD (chronic obstructive pulmonary disease)   Pulmonary infiltrate on chest x-ray   HLD  Lipitor.    She is intermittently pacing on tele.  She reports her ICD was put in by Dr. Dalbert Batman in [place] in  2005.  It has not been checked in a long time.  It is a [employer] device and will be interrogated today.    LOS: 1 day    HAGER, BRYAN PA-C 03/19/2015 8:46 AM  As above, patient seen and examined. She presented with acute congestive heart failure. She is improved with IV diuretics. Continue Lasix. Check potassium and renal function this morning and again tomorrow morning. Await echocardiogram to assess LV systolic and diastolic function. Given sudden onset of dyspnea she will need an ischemia evaluation. Troponin is not diagnostic. Await results of nuclear study. D-dimer is also elevated. CTA of the  chest is pending. Note her blood pressure is improving. Increased medications for hypertension as needed. Patient needs device interrogated. Kirk Ruths

## 2015-03-19 NOTE — Progress Notes (Signed)
ANTICOAGULATION and ANTIBIOTICS CONSULT NOTE - Follow Up Consult  Pharmacy Consult for  Heparin; Vancomycin and Zosyn Indication: rule out PE: HCAP and aspiration pneumonia coverage  No Known Allergies  Patient Measurements: Height: 5\' 6"  (167.6 cm) Weight: 191 lb 5.8 oz (86.8 kg) IBW/kg (Calculated) : 59.3 Heparin Dosing Weight: 81 kg  Vital Signs: Temp: 98.4 F (36.9 C) (06/10 0800) Temp Source: Oral (06/10 0800) BP: 145/64 mmHg (06/10 1039) Pulse Rate: 87 (06/10 0800)  Labs:  Recent Labs  03/18/15 0305  03/18/15 1428 03/18/15 2008 03/19/15 0235 03/19/15 1212  HGB 9.9*  --   --   --  8.1*  --   HCT 32.8*  --   --   --  26.7*  --   PLT 575*  --   --   --  321  --   HEPARINUNFRC  --   --   --   --  0.67 0.58  CREATININE 0.94  --   --   --   --   --   TROPONINI  --   < > 0.04* 0.04* 0.03  --   < > = values in this interval not displayed.  Estimated Creatinine Clearance: 54.7 mL/min (by C-G formula based on Cr of 0.94).  Assessment:   On IV heparin for rule out PE. Chest CT pending. Dopplers negative for DVT.   Initial heparin level was 0.67 on 1300 units/hr.  Subsequent level remains therapeutic at 0.58.  CBC has trended down. No bleeding noted. On daily iron as at home.    Day # 2 Vancomycin and Zosyn for HCAP and aspiration pneumonia coverage. Vancomycin loading dose given on 6/9, but first maintenance dose missed due to incompatible with IV heparin. Now has PICC line.  Goal of Therapy:  Heparin level 0.3-0.7 units/ml Monitor platelets by anticoagulation protocol: Yes   Plan:   Continue heparin drip at 1300 units/hr.  Daily heparin level and CBC while on heparin.  Follow up chest CT.     Continue Vancomycin 750 mg IV q12hrs.  Continue Zosyn 3.375 gm IV q8hrs (each over 4 hours).  Will follow up renal function, culture data, progress.  Arty Baumgartner, [place] Pager: [phone] 03/19/2015,2:15 PM

## 2015-03-19 NOTE — Clinical Social Work Note (Signed)
CSW attempted to see patient at bedside to confirm patient's return to [facility] once medically stable. Patient currently sleeping and unable to participate in assessment. Weekend CSW to follow-up.  Lubertha Sayres, [place] Clinical Social Work Department Orthopedics [phone]) and Surgical [phone])

## 2015-03-20 LAB — COMPREHENSIVE METABOLIC PANEL
ALT: 9 U/L — ABNORMAL LOW (ref 14–54)
AST: 12 U/L — ABNORMAL LOW (ref 15–41)
Albumin: 2.7 g/dL — ABNORMAL LOW (ref 3.5–5.0)
Alkaline Phosphatase: 41 U/L (ref 38–126)
Anion gap: 12 (ref 5–15)
BUN: 10 mg/dL (ref 6–20)
CO2: 28 mmol/L (ref 22–32)
Calcium: 8.7 mg/dL — ABNORMAL LOW (ref 8.9–10.3)
Chloride: 98 mmol/L — ABNORMAL LOW (ref 101–111)
Creatinine, Ser: 1.26 mg/dL — ABNORMAL HIGH (ref 0.44–1.00)
GFR calc Af Amer: 46 mL/min — ABNORMAL LOW (ref >60)
GFR calc non Af Amer: 40 mL/min — ABNORMAL LOW (ref >60)
Glucose, Bld: 69 mg/dL (ref 65–99)
Potassium: 3.3 mmol/L — ABNORMAL LOW (ref 3.5–5.1)
Sodium: 138 mmol/L (ref 135–145)
Total Bilirubin: 0.3 mg/dL (ref 0.3–1.2)
Total Protein: 6.4 g/dL — ABNORMAL LOW (ref 6.5–8.1)

## 2015-03-20 LAB — CBC
HCT: 24.4 % — ABNORMAL LOW (ref 36.0–46.0)
HCT: 24.6 % — ABNORMAL LOW (ref 36.0–46.0)
HEMATOCRIT: 25.3 % — AB (ref 36.0–46.0)
Hemoglobin: 7.4 g/dL — ABNORMAL LOW (ref 12.0–15.0)
Hemoglobin: 7.8 g/dL — ABNORMAL LOW (ref 12.0–15.0)
Hemoglobin: 7.8 g/dL — ABNORMAL LOW (ref 12.0–15.0)
MCH: 26.2 pg (ref 26.0–34.0)
MCH: 27.1 pg (ref 26.0–34.0)
MCH: 26.9 pg (ref 26.0–34.0)
MCHC: 30.3 g/dL (ref 30.0–36.0)
MCHC: 31.7 g/dL (ref 30.0–36.0)
MCHC: 30.8 g/dL (ref 30.0–36.0)
MCV: 86.5 fL (ref 78.0–100.0)
MCV: 85.4 fL (ref 78.0–100.0)
MCV: 87.2 fL (ref 78.0–100.0)
PLATELETS: 349 10*3/uL (ref 150–400)
PLATELETS: 343 10*3/uL (ref 150–400)
Platelets: 294 10*3/uL (ref 150–400)
RBC: 2.82 MIL/uL — ABNORMAL LOW (ref 3.87–5.11)
RBC: 2.88 MIL/uL — AB (ref 3.87–5.11)
RBC: 2.9 MIL/uL — ABNORMAL LOW (ref 3.87–5.11)
RDW: 17.2 % — AB (ref 11.5–15.5)
RDW: 17.1 % — ABNORMAL HIGH (ref 11.5–15.5)
RDW: 17.2 % — ABNORMAL HIGH (ref 11.5–15.5)
WBC: 5.1 10*3/uL (ref 4.0–10.5)
WBC: 5.3 10*3/uL (ref 4.0–10.5)
WBC: 5.7 10*3/uL (ref 4.0–10.5)

## 2015-03-20 LAB — PREPARE RBC (CROSSMATCH)

## 2015-03-20 LAB — MAGNESIUM: Magnesium: 1.3 mg/dL — ABNORMAL LOW (ref 1.7–2.4)

## 2015-03-20 LAB — HEPARIN LEVEL (UNFRACTIONATED): HEPARIN UNFRACTIONATED: 0.44 [IU]/mL (ref 0.30–0.70)

## 2015-03-20 MED ORDER — ENOXAPARIN SODIUM 40 MG/0.4ML ~~LOC~~ SOLN
40.0000 mg | SUBCUTANEOUS | Status: DC
  Administered 2015-03-20 – 2015-03-21 (×2): 40 mg via SUBCUTANEOUS
  Filled 2015-03-20 (×2): qty 0.4

## 2015-03-20 MED ORDER — POTASSIUM CHLORIDE CRYS ER 20 MEQ PO TBCR
40.0000 meq | EXTENDED_RELEASE_TABLET | Freq: Once | ORAL | Status: AC
  Administered 2015-03-20: 40 meq via ORAL
  Filled 2015-03-20: qty 2

## 2015-03-20 MED ORDER — SODIUM CHLORIDE 0.9 % IV SOLN: Freq: Once | INTRAVENOUS | Status: DC

## 2015-03-20 MED ORDER — TECHNETIUM TC 99M SESTAMIBI GENERIC - CARDIOLITE: 10.0000 | Freq: Once | INTRAVENOUS | Status: AC | PRN

## 2015-03-20 NOTE — Progress Notes (Signed)
Patient ID: [name], female   DOB: [birth year], 81 y.o.   MRN: [id number]    Subjective: Breathing better she seems a bit confused   Objective: Vital signs in last 24 hours: Temp:  [97.7 F (36.5 C)-98.8 F (37.1 C)] 97.7 F (36.5 C) (06/11 0825) Pulse Rate:  [73-86] 75 (06/11 0825) Resp:  [20] 20 (06/11 0825) BP: (118-153)/(46-66) 126/54 mmHg (06/11 0838) SpO2:  [97 %-100 %] 100 % (06/11 0839) Last BM Date: 03/20/15  Intake/Output from previous day: 06/10 0701 - 06/11 0700 In: 1319 [P.O.:957; I.V.:312; IV Piggyback:50] Out: 302 [Urine:300; Stool:2] Intake/Output this shift: Total I/O In: 120 [P.O.:120] Out: -   Medications Scheduled Meds: . antiseptic oral rinse  7 mL Mouth Rinse q12n4p  . aspirin EC  81 mg Oral Daily  . atorvastatin  20 mg Oral Daily  . carvedilol  12.5 mg Oral BID WC  . chlorhexidine  15 mL Mouth Rinse BID  . enoxaparin (LOVENOX) injection  40 mg Subcutaneous Q24H  . famotidine  20 mg Oral Daily  . feeding supplement (ENSURE ENLIVE)  237 mL Oral BID BM  . ferrous sulfate  325 mg Oral Q breakfast  . fluticasone  1 spray Each Nare Daily  . furosemide  40 mg Intravenous BID  . isosorbide mononitrate  30 mg Oral Daily  . levothyroxine  50 mcg Oral QAC breakfast  . lisinopril  5 mg Oral Daily  . mometasone-formoterol  2 puff Inhalation BID  . piperacillin-tazobactam (ZOSYN)  IV  3.375 g Intravenous Q8H  . regadenoson  0.4 mg Intravenous Once  . sodium chloride  3 mL Intravenous Q12H  . vancomycin  750 mg Intravenous Q12H   Continuous Infusions:   PRN Meds:.sodium chloride, acetaminophen, HYDROcodone-acetaminophen, ondansetron (ZOFRAN) IV, sodium chloride, sodium chloride  PE: General appearance: alert, cooperative and no distress Neck: No JVD Lungs: Basilar crackles. No wheeze Heart: regular rate and rhythm Abdomen: +BS, nontender Extremities: No pitting edema Pulses: 2+ and symmetric Skin: Warm and dry Neurologic: Grossly normal  Lab  Results:   Recent Labs  03/18/15 0305 03/19/15 0235 03/20/15 0500  WBC 8.6 5.4 5.1  HGB 9.9* 8.1* 7.4*  HCT 32.8* 26.7* 24.4*  PLT 575* 321 349   BMET  Recent Labs  03/18/15 0305 03/20/15 0500  NA 137 138  K 4.3 3.3*  CL 103 98*  CO2 23 28  GLUCOSE 167* 69  BUN 10 10  CREATININE 0.94 1.26*  CALCIUM 9.4 8.7*     Assessment/Plan 81 y.o. female with Past medical history of hypertension, asthma, ? Prior CHF managed by her PCP, COPD, hypothyroidism, anemia, colostomy, chronic kidney disease. She presented with complaints of sudden onset of shortness of breath associated with diaphoresis, no chest pain.  Principal Problem:   Acute CHF  She is breathing much better  Net fluids:  -1ml. Not accurate due to incontinence.  Getting lasix 40mg  IV BID.   Cr 1.26   CXR Mild improvement in airspace opacity in right lower lobe since prior exam.  Stable cardiomegaly and diffuse pulmonary vascular congestion. Echo EF 30-35%   She never got her nuclear study.  Will arrange right and left heart cath for Monday  I believe she had a rest portion of myovue but never stress and rest portion not processed     Anemia  Hgb dropped from 9.9 to 8.1.  Needs further w/u by primary service      HTN (hypertension)  Labile.  Continue to monitor.  Coreg 12.5 BID, lisinopril 5QD    DM (dermatomyositis)   Colostomy in place   COPD (chronic obstructive pulmonary disease)   Pulmonary infiltrate on chest x-ray   HLD  Lipitor.   AICD: medtronic normal function    LOS: 2 days    Jenkins Rouge PA-C 03/20/2015 10:16 AM

## 2015-03-20 NOTE — Progress Notes (Addendum)
[facility] PROGRESS NOTE  [name] NLG:[id number] DOB: 08/03/1934 DOA: 03/18/2015 PCP: No primary care provider on file.  Assessment/Plan: Principal Problem:   Acute CHF Active Problems:   Anemia   HTN (hypertension)   DM (dermatomyositis)   Colostomy in place   COPD (chronic obstructive pulmonary disease)   Pulmonary infiltrate on chest x-ray   Acute respiratory failure, unspecified whether with hypoxia or hypercapnia       CHF - unknown etiology and unknown systolic/diastolic function,  - elevated BNP, LE edema and crackles on physical exam improving Continue Lasix 40 mg IV twice a day Echocardiogram shows an EF of 35-40%, management per cardiology right and left heart cath for Monday   Abnormal d-dimer CT angiogram of the chest negative for PE Venous Doppler negative Heparin drip discontinued  Anemia of chronic disease, drop from 9.9-7.4, given abnormal troponin and possible underlying coronary artery disease, will transfuse 1 unit of packed red blood cells for hemoglobin less than 7.0 No active bleeding Patient's heparin drip has been discontinued as there is no evidence of PE On heparin subcutaneous for DVT prophylaxis  Abnormal troponin - nondiagnostic per cardiology, awaiting results of the nuclear study and negative  CT angiogram - baseline LBBB present a months ago, no prior ECGs - most probably secondary to CHF - continue ASA, atorvastatin, right and left heart cath for Monday  Pacemaker interrogation by cardiology,   Hypertension  Improving, continue Coreg Lasix and lisinopril   Hyperlipidemia - continue atorvastatin   questionable pneumonia, chest x-ray does not show any evidence of pneumonia, most likely atelectasis, stop antibiotics   Code Status: full Family Communication: family updated about patient's clinical progress Disposition Plan:  As above    Brief narrative: 81 y.o. female with Past medical history of hypertension,  asthma, ? Prior CHF managed by her PCP, COPD, hypothyroidism, anemia, colostomy, chronic kidney disease. There is FH of CHF in her grandmother. The patient presents with complaints of sudden onset of shortness of breath associated with diaphoresis, no chest pain. This happened while she was in bed.  She mentions that she was sleeping and suddenly woke up from her sleep as it felt hot.She denies any chest pain denies any cough denies any choking episode denies any fever denies any chills denies any diarrhea or constipation or abdominal pain or any focal deficit. She denies any changes in her medication.  She is minimally active, she lives in assisted living community, never goes outside and has her food delivered to her room. She works with a physical therapist twice a day. She has had similar symptoms in the past but can't define how often.   Consultants:  Cardiology  Procedures:  None  Antibiotics: Anti-infectives    Start     Dose/Rate Route Frequency Ordered Stop   03/19/15 0200  vancomycin (VANCOCIN) IVPB 750 mg/150 ml premix     750 mg 150 mL/hr over 60 Minutes Intravenous Every 12 hours 03/18/15 1318     03/18/15 1330  vancomycin (VANCOCIN) 1,500 mg in sodium chloride 0.9 % 500 mL IVPB     1,500 mg 250 mL/hr over 120 Minutes Intravenous  Once 03/18/15 1319 03/18/15 1622   03/18/15 1000  piperacillin-tazobactam (ZOSYN) IVPB 3.375 g     3.375 g 12.5 mL/hr over 240 Minutes Intravenous Every 8 hours 03/18/15 0915     03/18/15 1000  vancomycin (VANCOCIN) 1,500 mg in sodium chloride 0.9 % 500 mL IVPB  Status:  Discontinued     1,500 mg 250 mL/hr over 120 Minutes Intravenous  Once 03/18/15 0915 03/18/15 1319         HPI/Subjective: Denies any active chest pain, shortness of breath, no evidence of melena  or hematochezia  Objective: Filed Vitals:   03/20/15 0510 03/20/15 0825 03/20/15 0838 03/20/15 0839  BP: 142/66 132/47 126/54   Pulse: 84 75    Temp: 98.4 F (36.9 C)  97.7 F (36.5 C)    TempSrc: Oral Oral    Resp:  20    Height:      Weight:      SpO2: 100% 100%  100%    Intake/Output Summary (Last 24 hours) at 03/20/15 1112 Last data filed at 03/20/15 0800  Gross per 24 hour  Intake    839 ml  Output    202 ml  Net    637 ml    Exam: General appearance: alert, cooperative and no distress Neck: No JVD Lungs: Basilar crackles. No wheeze Heart: regular rate and rhythm Abdomen: +BS, nontender Extremities: No pitting edema Pulses: 2+ and symmetric Skin: Warm and dry Neurologic: Grossly normal     Data Review   Micro Results Recent Results (from the past 240 hour(s))  MRSA PCR Screening     Status: None   Collection Time: 03/18/15  6:06 AM  Result Value Ref Range Status   MRSA by PCR NEGATIVE NEGATIVE Final    Comment:        The GeneXpert MRSA Assay (FDA approved for NASAL specimens only), is one component of a comprehensive MRSA colonization surveillance program. It is not intended to diagnose MRSA infection nor to guide or monitor treatment for MRSA infections.   Culture, blood (routine x 2)     Status: None (Preliminary result)   Collection Time: 03/18/15  7:05 AM  Result Value Ref Range Status   Specimen Description BLOOD RIGHT ANTECUBITAL  Final   Special Requests BOTTLES DRAWN AEROBIC ONLY 10CC  Final   Culture   Final           BLOOD CULTURE RECEIVED NO GROWTH TO DATE CULTURE WILL BE HELD FOR 5 DAYS BEFORE ISSUING A FINAL NEGATIVE REPORT Performed at [facility]    Report Status PENDING  Incomplete  Culture, blood (routine x 2)     Status: None (Preliminary result)   Collection Time: 03/18/15  7:20 AM  Result Value Ref Range Status   Specimen Description BLOOD LEFT ANTECUBITAL  Final   Special Requests BOTTLES DRAWN AEROBIC ONLY 10CC  Final   Culture   Final           BLOOD CULTURE RECEIVED NO GROWTH TO DATE CULTURE WILL BE HELD FOR 5 DAYS BEFORE ISSUING A FINAL NEGATIVE REPORT Performed at [facility]    Report Status PENDING  Incomplete    Radiology Reports Dg Chest 2 View  03/18/2015   CLINICAL DATA:  Followup right lower lobe pneumonia. Congestive heart failure. COPD.  EXAM: CHEST  2 VIEW  COMPARISON:  Prior today on 03/18/2015  FINDINGS: Cardiomegaly is stable. Dual lead transvenous pacemaker remains in appropriate position.  There is mild improvement in airspace opacity in the right lower lobe since previous study. Diffuse pulmonary vascular congestion is demonstrated, without evidence of frank pulmonary edema.  IMPRESSION: Mild improvement in airspace opacity in right lower lobe since prior exam.  Stable cardiomegaly and diffuse pulmonary vascular congestion.   Electronically Signed   By: Jenny Reichmann  Kris Hartmann M.D.   On: 03/18/2015 17:43   Ct Angio Chest Pe W/cm &/or Wo Cm  03/19/2015   CLINICAL DATA:  Shortness of breath x several days, pulmonary infiltrate on chest radiograph  EXAM: CT ANGIOGRAPHY CHEST WITH CONTRAST  TECHNIQUE: Multidetector CT imaging of the chest was performed using the standard protocol during bolus administration of intravenous contrast. Multiplanar CT image reconstructions and MIPs were obtained to evaluate the vascular anatomy.  CONTRAST:  64mL OMNIPAQUE IOHEXOL 350 MG/ML SOLN  COMPARISON:  Chest radiograph dated 03/18/2015  FINDINGS: No evidence of pulmonary embolism.  Mediastinum/Nodes: Cardiomegaly.  Small pericardial effusion.  Left subclavian ICD.  Atherosclerotic calcifications of the aortic arch.  Small mediastinal lymph nodes, favored to be reactive, including:  --9 mm short axis prevascular node (series 5/ image 33)  --10 mm short axis subcarinal node (series 5/ image 43)  --13 mm short axis left hilar node (series 5/image 47)  Enlarged left axillary nodes measuring up to 15 mm short axis (series 5/ image 26), abnormal, although poorly evaluated due to streak artifact from the patient's pacemaker.  Small right axillary nodes.  Small left supraclavicular nodes  measuring up to 8 mm short axis (series 5/ image 11), within the upper limits of normal.  Lungs/Pleura: Mild patchy opacities in the lingula and bilateral lower lobes, likely atelectasis.  No focal consolidation.  8 mm pleural-based nodule overlying the right middle lobe (series 12/image 54), unchanged since 2010.  No suspicious pulmonary nodules.  No frank interstitial edema.  No pleural effusion or pneumothorax.  Upper abdomen: Visualized upper abdomen is unremarkable.  Musculoskeletal: Degenerative changes of the visualized thoracolumbar spine.  Review of the MIP images confirms the above findings.  IMPRESSION: No evidence of pulmonary embolism.  No frank interstitial edema.  Small supraclavicular and thoracic lymph nodes, most of which can be considered reactive, although left axillary nodes are enlarged up to 15 mm which is considered abnormal. Correlate for signs/symptoms of lymphoproliferative disorder or left breast malignancy.   Electronically Signed   By: Julian Hy M.D.   On: 03/19/2015 18:08   Nm Myocar Single W/spect W/wall Motion And Ef  03/20/2015    This is a low risk study.  Defect 1: There is a defect present in the mid anteroseptal, apical  anterior and apical septal location.   Rest only imaging.  There is a moderate region of decreased radiotracer  uptake in the anteroseptal wall of the mid ventricle and apex which could  represent breast attenuation artifact or a region of prior infarct/scar.    Dg Chest Port 1 View  03/18/2015   CLINICAL DATA:  Dyspnea, onset tonight  EXAM: PORTABLE CHEST - 1 VIEW  COMPARISON:  03/01/2015  FINDINGS: There is mild unchanged cardiomegaly. There are intact appearances of the transvenous leads.  There is new right base infiltrate and this may represent pneumonia. There is no large effusion. The left lung is clear.  IMPRESSION: Right base infiltrate.   Electronically Signed   By: Andreas Newport M.D.   On: 03/18/2015 04:09   Dg Chest Port 1  View  03/01/2015   CLINICAL DATA:  Sudden onset of fever today.  EXAM: PORTABLE CHEST - 1 VIEW  COMPARISON:  02/13/2009.  FINDINGS: Cardiac silhouette mildly enlarged. No mediastinal hilar masses. Left anterior chest wall sequential pacemaker leads project over the right atrium right ventricle.  No lung consolidation or edema. No pleural effusion or pneumothorax.  Bony thorax is demineralized but grossly intact.  IMPRESSION:  No acute cardiopulmonary disease.   Electronically Signed   By: Lajean Manes M.D.   On: 03/01/2015 21:20     CBC  Recent Labs Lab 03/18/15 0305 03/19/15 0235 03/20/15 0500  WBC 8.6 5.4 5.1  HGB 9.9* 8.1* 7.4*  HCT 32.8* 26.7* 24.4*  PLT 575* 321 349  MCV 86.8 85.9 86.5  MCH 26.2 26.0 26.2  MCHC 30.2 30.3 30.3  RDW 16.8* 17.0* 17.2*    Chemistries   Recent Labs Lab 03/18/15 0305 03/20/15 0500  NA 137 138  K 4.3 3.3*  CL 103 98*  CO2 23 28  GLUCOSE 167* 69  BUN 10 10  CREATININE 0.94 1.26*  CALCIUM 9.4 8.7*  AST  --  12*  ALT  --  9*  ALKPHOS  --  41  BILITOT  --  0.3   ------------------------------------------------------------------------------------------------------------------ estimated creatinine clearance is 40.8 mL/min (by C-G formula based on Cr of 1.26). ------------------------------------------------------------------------------------------------------------------ No results for input(s): HGBA1C in the last 72 hours. ------------------------------------------------------------------------------------------------------------------ No results for input(s): CHOL, HDL, LDLCALC, TRIG, CHOLHDL, LDLDIRECT in the last 72 hours. ------------------------------------------------------------------------------------------------------------------  Recent Labs  03/18/15 0705  TSH 2.233   ------------------------------------------------------------------------------------------------------------------ No results for input(s): VITAMINB12, FOLATE,  FERRITIN, TIBC, IRON, RETICCTPCT in the last 72 hours.  Coagulation profile No results for input(s): INR, PROTIME in the last 168 hours.   Recent Labs  03/18/15 1045  DDIMER 4.63*    Cardiac Enzymes  Recent Labs Lab 03/18/15 1428 03/18/15 2008 03/19/15 0235  TROPONINI 0.04* 0.04* 0.03   ------------------------------------------------------------------------------------------------------------------ Invalid input(s): POCBNP   CBG: No results for input(s): GLUCAP in the last 168 hours.     Studies: Dg Chest 2 View  03/18/2015   CLINICAL DATA:  Followup right lower lobe pneumonia. Congestive heart failure. COPD.  EXAM: CHEST  2 VIEW  COMPARISON:  Prior today on 03/18/2015  FINDINGS: Cardiomegaly is stable. Dual lead transvenous pacemaker remains in appropriate position.  There is mild improvement in airspace opacity in the right lower lobe since previous study. Diffuse pulmonary vascular congestion is demonstrated, without evidence of frank pulmonary edema.  IMPRESSION: Mild improvement in airspace opacity in right lower lobe since prior exam.  Stable cardiomegaly and diffuse pulmonary vascular congestion.   Electronically Signed   By: Earle Gell M.D.   On: 03/18/2015 17:43   Ct Angio Chest Pe W/cm &/or Wo Cm  03/19/2015   CLINICAL DATA:  Shortness of breath x several days, pulmonary infiltrate on chest radiograph  EXAM: CT ANGIOGRAPHY CHEST WITH CONTRAST  TECHNIQUE: Multidetector CT imaging of the chest was performed using the standard protocol during bolus administration of intravenous contrast. Multiplanar CT image reconstructions and MIPs were obtained to evaluate the vascular anatomy.  CONTRAST:  45mL OMNIPAQUE IOHEXOL 350 MG/ML SOLN  COMPARISON:  Chest radiograph dated 03/18/2015  FINDINGS: No evidence of pulmonary embolism.  Mediastinum/Nodes: Cardiomegaly.  Small pericardial effusion.  Left subclavian ICD.  Atherosclerotic calcifications of the aortic arch.  Small  mediastinal lymph nodes, favored to be reactive, including:  --9 mm short axis prevascular node (series 5/ image 33)  --10 mm short axis subcarinal node (series 5/ image 43)  --13 mm short axis left hilar node (series 5/image 47)  Enlarged left axillary nodes measuring up to 15 mm short axis (series 5/ image 26), abnormal, although poorly evaluated due to streak artifact from the patient's pacemaker.  Small right axillary nodes.  Small left supraclavicular nodes measuring up to 8 mm short axis (series 5/ image 11), within  the upper limits of normal.  Lungs/Pleura: Mild patchy opacities in the lingula and bilateral lower lobes, likely atelectasis.  No focal consolidation.  8 mm pleural-based nodule overlying the right middle lobe (series 12/image 54), unchanged since 2010.  No suspicious pulmonary nodules.  No frank interstitial edema.  No pleural effusion or pneumothorax.  Upper abdomen: Visualized upper abdomen is unremarkable.  Musculoskeletal: Degenerative changes of the visualized thoracolumbar spine.  Review of the MIP images confirms the above findings.  IMPRESSION: No evidence of pulmonary embolism.  No frank interstitial edema.  Small supraclavicular and thoracic lymph nodes, most of which can be considered reactive, although left axillary nodes are enlarged up to 15 mm which is considered abnormal. Correlate for signs/symptoms of lymphoproliferative disorder or left breast malignancy.   Electronically Signed   By: Julian Hy M.D.   On: 03/19/2015 18:08   Nm Myocar Single W/spect W/wall Motion And Ef  03/20/2015    This is a low risk study.  Defect 1: There is a defect present in the mid anteroseptal, apical  anterior and apical septal location.   Rest only imaging.  There is a moderate region of decreased radiotracer  uptake in the anteroseptal wall of the mid ventricle and apex which could  represent breast attenuation artifact or a region of prior infarct/scar.       No results found for:  HGBA1C Lab Results  Component Value Date   [facility]  02/11/2009    91        Total Cholesterol/HDL:CHD Risk Coronary Heart Disease Risk Table                     Men   Women  1/2 Average Risk   3.4   3.3  Average Risk       5.0   4.4  2 X Average Risk   9.6   7.1  3 X Average Risk  23.4   11.0        Use the calculated Patient Ratio above and the CHD Risk Table to determine the patient's CHD Risk.        ATP III CLASSIFICATION (LDL):  <100     mg/dL   Optimal  100-129  mg/dL   Near or Above                    Optimal  130-159  mg/dL   Borderline  160-189  mg/dL   High  >190     mg/dL   Very High   CREATININE 1.26* 03/20/2015       Scheduled Meds: . sodium chloride   Intravenous Once  . antiseptic oral rinse  7 mL Mouth Rinse q12n4p  . aspirin EC  81 mg Oral Daily  . atorvastatin  20 mg Oral Daily  . carvedilol  12.5 mg Oral BID WC  . chlorhexidine  15 mL Mouth Rinse BID  . enoxaparin (LOVENOX) injection  40 mg Subcutaneous Q24H  . famotidine  20 mg Oral Daily  . feeding supplement (ENSURE ENLIVE)  237 mL Oral BID BM  . ferrous sulfate  325 mg Oral Q breakfast  . fluticasone  1 spray Each Nare Daily  . furosemide  40 mg Intravenous BID  . isosorbide mononitrate  30 mg Oral Daily  . levothyroxine  50 mcg Oral QAC breakfast  . lisinopril  5 mg Oral Daily  . mometasone-formoterol  2 puff Inhalation BID  . piperacillin-tazobactam (ZOSYN)  IV  3.375 g Intravenous Q8H  . potassium chloride  40 mEq Oral Once  . regadenoson  0.4 mg Intravenous Once  . sodium chloride  3 mL Intravenous Q12H  . vancomycin  750 mg Intravenous Q12H   Continuous Infusions:    Principal Problem:   Acute CHF Active Problems:   Anemia   HTN (hypertension)   DM (dermatomyositis)   Colostomy in place   COPD (chronic obstructive pulmonary disease)   Pulmonary infiltrate on chest x-ray   Acute respiratory failure, unspecified whether with hypoxia or hypercapnia    Time spent: 45  minutes   [facility] Pager [phone]. If 7PM-7AM, please contact night-coverage at [email or web address], password [facility] 03/20/2015, 11:12 AM  LOS: 2 days

## 2015-03-21 LAB — COMPREHENSIVE METABOLIC PANEL
ALBUMIN: 2.8 g/dL — AB (ref 3.5–5.0)
ALT: 9 U/L — ABNORMAL LOW (ref 14–54)
ANION GAP: 10 (ref 5–15)
AST: 14 U/L — ABNORMAL LOW (ref 15–41)
Alkaline Phosphatase: 40 U/L (ref 38–126)
BILIRUBIN TOTAL: 0.3 mg/dL (ref 0.3–1.2)
BUN: 11 mg/dL (ref 6–20)
CO2: 31 mmol/L (ref 22–32)
CREATININE: 1.11 mg/dL — AB (ref 0.44–1.00)
Calcium: 9 mg/dL (ref 8.9–10.3)
Chloride: 98 mmol/L — ABNORMAL LOW (ref 101–111)
GFR calc Af Amer: 54 mL/min — ABNORMAL LOW (ref >60)
GFR calc non Af Amer: 46 mL/min — ABNORMAL LOW (ref >60)
Glucose, Bld: 81 mg/dL (ref 65–99)
POTASSIUM: 3.4 mmol/L — AB (ref 3.5–5.1)
SODIUM: 139 mmol/L (ref 135–145)
Total Protein: 6.6 g/dL (ref 6.5–8.1)

## 2015-03-21 LAB — CBC
HCT: 25.4 % — ABNORMAL LOW (ref 36.0–46.0)
Hemoglobin: 7.8 g/dL — ABNORMAL LOW (ref 12.0–15.0)
MCH: 26.6 pg (ref 26.0–34.0)
MCHC: 30.7 g/dL (ref 30.0–36.0)
MCV: 86.7 fL (ref 78.0–100.0)
Platelets: 332 10*3/uL (ref 150–400)
RBC: 2.93 MIL/uL — AB (ref 3.87–5.11)
RDW: 17.3 % — ABNORMAL HIGH (ref 11.5–15.5)
WBC: 5.5 10*3/uL (ref 4.0–10.5)

## 2015-03-21 LAB — PROTIME-INR
INR: 1.3 (ref 0.00–1.49)
Prothrombin Time: 16.3 seconds — ABNORMAL HIGH (ref 11.6–15.2)

## 2015-03-21 MED ORDER — ASPIRIN 81 MG PO CHEW
81.0000 mg | CHEWABLE_TABLET | ORAL | Status: AC
  Administered 2015-03-22: 81 mg via ORAL
  Filled 2015-03-21: qty 1

## 2015-03-21 MED ORDER — SODIUM CHLORIDE 0.9 % IJ SOLN: 3.0000 mL | INTRAMUSCULAR | Status: DC | PRN

## 2015-03-21 MED ORDER — SODIUM CHLORIDE 0.9 % IV SOLN: 250.0000 mL | INTRAVENOUS | Status: DC | PRN

## 2015-03-21 MED ORDER — ASPIRIN EC 81 MG PO TBEC
81.0000 mg | DELAYED_RELEASE_TABLET | Freq: Every day | ORAL | Status: DC
  Administered 2015-03-23 – 2015-03-24 (×2): 81 mg via ORAL
  Filled 2015-03-21 (×2): qty 1

## 2015-03-21 MED ORDER — ASPIRIN 81 MG PO CHEW: 81.0000 mg | CHEWABLE_TABLET | ORAL | Status: DC

## 2015-03-21 MED ORDER — SODIUM CHLORIDE 0.9 % IJ SOLN: 3.0000 mL | Freq: Two times a day (BID) | INTRAMUSCULAR | Status: DC

## 2015-03-21 MED ORDER — POTASSIUM CHLORIDE CRYS ER 20 MEQ PO TBCR
40.0000 meq | EXTENDED_RELEASE_TABLET | Freq: Once | ORAL | Status: AC
  Administered 2015-03-21: 40 meq via ORAL
  Filled 2015-03-21: qty 2

## 2015-03-21 MED ORDER — SODIUM CHLORIDE 0.9 % IV SOLN
INTRAVENOUS | Status: DC
  Administered 2015-03-21: 23:00:00 via INTRAVENOUS

## 2015-03-21 MED ORDER — SODIUM CHLORIDE 0.9 % IV SOLN: INTRAVENOUS | Status: DC

## 2015-03-21 MED ORDER — SODIUM CHLORIDE 0.9 % IJ SOLN
3.0000 mL | Freq: Two times a day (BID) | INTRAMUSCULAR | Status: DC
  Administered 2015-03-21: 3 mL via INTRAVENOUS

## 2015-03-21 NOTE — Progress Notes (Signed)
Patient ID: [name], female   DOB: 04-Apr-1934, 81 y.o.   MRN: [id number]    Subjective: Breathing better she seems a bit confused   Objective: Vital signs in last 24 hours: Temp:  [98.1 F (36.7 C)-99.1 F (37.3 C)] 99.1 F (37.3 C) (06/12 0500) Pulse Rate:  [82-85] 84 (06/12 0020) Resp:  [16-18] 18 (06/12 0020) BP: (126-144)/(53-64) 144/61 mmHg (06/12 0020) SpO2:  [98 %-100 %] 98 % (06/12 0805) Weight:  [84.913 kg (187 lb 3.2 oz)] 84.913 kg (187 lb 3.2 oz) (06/12 0500) Last BM Date: 03/21/15  Intake/Output from previous day: 06/11 0701 - 06/12 0700 In: 692 [P.O.:600; I.V.:42; IV Piggyback:50] Out: 901 [Urine:900; Stool:1] Intake/Output this shift:    Medications Scheduled Meds: . sodium chloride   Intravenous Once  . antiseptic oral rinse  7 mL Mouth Rinse q12n4p  . aspirin EC  81 mg Oral Daily  . atorvastatin  20 mg Oral Daily  . carvedilol  12.5 mg Oral BID WC  . chlorhexidine  15 mL Mouth Rinse BID  . enoxaparin (LOVENOX) injection  40 mg Subcutaneous Q24H  . famotidine  20 mg Oral Daily  . feeding supplement (ENSURE ENLIVE)  237 mL Oral BID BM  . ferrous sulfate  325 mg Oral Q breakfast  . fluticasone  1 spray Each Nare Daily  . furosemide  40 mg Intravenous BID  . isosorbide mononitrate  30 mg Oral Daily  . levothyroxine  50 mcg Oral QAC breakfast  . lisinopril  5 mg Oral Daily  . mometasone-formoterol  2 puff Inhalation BID  . regadenoson  0.4 mg Intravenous Once  . sodium chloride  3 mL Intravenous Q12H   Continuous Infusions:   PRN Meds:.sodium chloride, acetaminophen, HYDROcodone-acetaminophen, ondansetron (ZOFRAN) IV, sodium chloride, sodium chloride  PE: General appearance: alert, cooperative and no distress Neck: No JVD Lungs: Basilar crackles. No wheeze Heart: regular rate and rhythm Abdomen: +BS, nontender Extremities: No pitting edema Pulses: 2+ and symmetric Skin: Warm and dry Neurologic: Grossly normal  Lab Results:   Recent  Labs  03/20/15 1325 03/20/15 2210 03/21/15 0510  WBC 5.3 5.7 5.5  HGB 7.8* 7.8* 7.8*  HCT 24.6* 25.3* 25.4*  PLT 294 343 332   BMET  Recent Labs  03/20/15 0500 03/21/15 0510  NA 138 139  K 3.3* 3.4*  CL 98* 98*  CO2 28 31  GLUCOSE 69 81  BUN 10 11  CREATININE 1.26* 1.11*  CALCIUM 8.7* 9.0     Assessment/Plan 81 y.o. female with Past medical history of hypertension, asthma, ? Prior CHF managed by her PCP, COPD, hypothyroidism, anemia, colostomy, chronic kidney disease. She presented with complaints of sudden onset of shortness of breath associated with diaphoresis, no chest pain.  Principal Problem:   Acute CHF  She is breathing much better  Net fluids:  -8ml. Not accurate due to incontinence.  Getting lasix 40mg  IV BID.   Cr 1.26   CXR Mild improvement in airspace opacity in right lower lobe since prior exam.  Stable cardiomegaly and diffuse pulmonary vascular congestion. Echo EF 30-35%   She never got her nuclear study.  Will arrange right and left heart cath for Monday  I believe she had a rest portion of myovue but never stress and rest portion not processed   Orders for cath with Dr Ellyn Hack done     Anemia  Hgb dropped from 9.9 to 8.1.  Needs further w/u by primary service      HTN (  hypertension)  Labile.  Continue to monitor.  Coreg 12.5 BID, lisinopril 5QD    DM (dermatomyositis)   Colostomy in place   COPD (chronic obstructive pulmonary disease)   Pulmonary infiltrate on chest x-ray   HLD  Lipitor.   AICD: medtronic normal function    LOS: 3 days    Jenkins Rouge PA-C 03/21/2015 8:30 AM

## 2015-03-21 NOTE — Progress Notes (Addendum)
[facility] PROGRESS NOTE  [name] QIO:[id number] DOB: [birth year] DOA: 03/18/2015 PCP: No primary care provider on file.  Assessment/Plan: Principal Problem:   Acute CHF Active Problems:   Anemia   HTN (hypertension)   DM (dermatomyositis)   Colostomy in place   COPD (chronic obstructive pulmonary disease)   Pulmonary infiltrate on chest x-ray   Acute respiratory failure, unspecified whether with hypoxia or hypercapnia       CHF - unknown etiology and unknown systolic/diastolic function,  - elevated BNP, LE edema and crackles on physical exam improving Continue Lasix 40 mg IV twice a day Echocardiogram shows an EF of 35-40%, management per cardiology right and left heart cath for Monday   Abnormal d-dimer CT angiogram of the chest negative for PE Venous Doppler negative Heparin drip discontinued  Anemia of chronic disease, drop from 9.9-7.4, given abnormal troponin and possible underlying coronary artery disease, will transfuse 1 unit of packed red blood cells today, prior to the patient's cardiac cath No active bleeding, hemoglobin has stayed around 7.8, DC serial H&H Patient's heparin drip was discontinued 6/10, no evidence of active bleeding Continue heparin subcutaneous for DVT prophylaxis   Abnormal troponin - nondiagnostic per cardiology, awaiting results of the nuclear study   negative  CT angiogram - baseline LBBB present a months ago, no prior ECGs - most probably secondary to CHF - continue ASA, atorvastatin, right and left heart cath for Monday  Pacemaker interrogation by cardiology,   Hypertension  Improving, continue Coreg Lasix and lisinopril   Hyperlipidemia - continue atorvastatin   questionable pneumonia, chest x-ray does not show any evidence of pneumonia, most likely atelectasis, stopped antibiotics   Code Status: full Family Communication: family updated about patient's clinical progress Disposition Plan: Cardiac cath in the  morning   Brief narrative: 81 y.o. female with Past medical history of hypertension, asthma, ? Prior CHF managed by her PCP, COPD, hypothyroidism, anemia, colostomy, chronic kidney disease. There is FH of CHF in her grandmother. The patient presents with complaints of sudden onset of shortness of breath associated with diaphoresis, no chest pain. This happened while she was in bed.  She mentions that she was sleeping and suddenly woke up from her sleep as it felt hot.She denies any chest pain denies any cough denies any choking episode denies any fever denies any chills denies any diarrhea or constipation or abdominal pain or any focal deficit. She denies any changes in her medication.  She is minimally active, she lives in assisted living community, never goes outside and has her food delivered to her room. She works with a physical therapist twice a day. She has had similar symptoms in the past but can't define how often.   Consultants:  Cardiology  Procedures:  None  Antibiotics: Anti-infectives    Start     Dose/Rate Route Frequency Ordered Stop   03/19/15 0200  vancomycin (VANCOCIN) IVPB 750 mg/150 ml premix  Status:  Discontinued     750 mg 150 mL/hr over 60 Minutes Intravenous Every 12 hours 03/18/15 1318 03/20/15 1116   03/18/15 1330  vancomycin (VANCOCIN) 1,500 mg in sodium chloride 0.9 % 500 mL IVPB     1,500 mg 250 mL/hr over 120 Minutes Intravenous  Once 03/18/15 1319 03/18/15 1622   03/18/15 1000  piperacillin-tazobactam (ZOSYN) IVPB 3.375 g  Status:  Discontinued     3.375 g 12.5 mL/hr over 240 Minutes Intravenous Every 8 hours 03/18/15 0915 03/20/15 1116  03/18/15 1000  vancomycin (VANCOCIN) 1,500 mg in sodium chloride 0.9 % 500 mL IVPB  Status:  Discontinued     1,500 mg 250 mL/hr over 120 Minutes Intravenous  Once 03/18/15 0915 03/18/15 1319         HPI/Subjective: Denies any active chest pain, shortness of breath, no evidence of melena  or  hematochezia  Objective: Filed Vitals:   03/21/15 0500 03/21/15 0735 03/21/15 0805 03/21/15 0906  BP:  157/93  113/71  Pulse:  82  82  Temp: 99.1 F (37.3 C)   98.5 F (36.9 C)  TempSrc: Oral   Oral  Resp:      Height:      Weight: 84.913 kg (187 lb 3.2 oz)     SpO2:  99% 98% 99%    Intake/Output Summary (Last 24 hours) at 03/21/15 1102 Last data filed at 03/20/15 2200  Gross per 24 hour  Intake    480 ml  Output    901 ml  Net   -421 ml    Exam: General appearance: alert, cooperative and no distress Neck: No JVD Lungs: Basilar crackles. No wheeze Heart: regular rate and rhythm Abdomen: +BS, nontender Extremities: No pitting edema Pulses: 2+ and symmetric Skin: Warm and dry Neurologic: Grossly normal     Data Review   Micro Results Recent Results (from the past 240 hour(s))  MRSA PCR Screening     Status: None   Collection Time: 03/18/15  6:06 AM  Result Value Ref Range Status   MRSA by PCR NEGATIVE NEGATIVE Final    Comment:        The GeneXpert MRSA Assay (FDA approved for NASAL specimens only), is one component of a comprehensive MRSA colonization surveillance program. It is not intended to diagnose MRSA infection nor to guide or monitor treatment for MRSA infections.   Culture, blood (routine x 2)     Status: None (Preliminary result)   Collection Time: 03/18/15  7:05 AM  Result Value Ref Range Status   Specimen Description BLOOD RIGHT ANTECUBITAL  Final   Special Requests BOTTLES DRAWN AEROBIC ONLY 10CC  Final   Culture   Final           BLOOD CULTURE RECEIVED NO GROWTH TO DATE CULTURE WILL BE HELD FOR 5 DAYS BEFORE ISSUING A FINAL NEGATIVE REPORT Performed at [facility]    Report Status PENDING  Incomplete  Culture, blood (routine x 2)     Status: None (Preliminary result)   Collection Time: 03/18/15  7:20 AM  Result Value Ref Range Status   Specimen Description BLOOD LEFT ANTECUBITAL  Final   Special Requests BOTTLES DRAWN  AEROBIC ONLY 10CC  Final   Culture   Final           BLOOD CULTURE RECEIVED NO GROWTH TO DATE CULTURE WILL BE HELD FOR 5 DAYS BEFORE ISSUING A FINAL NEGATIVE REPORT Performed at [facility]    Report Status PENDING  Incomplete    Radiology Reports Dg Chest 2 View  03/18/2015   CLINICAL DATA:  Followup right lower lobe pneumonia. Congestive heart failure. COPD.  EXAM: CHEST  2 VIEW  COMPARISON:  Prior today on 03/18/2015  FINDINGS: Cardiomegaly is stable. Dual lead transvenous pacemaker remains in appropriate position.  There is mild improvement in airspace opacity in the right lower lobe since previous study. Diffuse pulmonary vascular congestion is demonstrated, without evidence of frank pulmonary edema.  IMPRESSION: Mild improvement in airspace opacity in right  lower lobe since prior exam.  Stable cardiomegaly and diffuse pulmonary vascular congestion.   Electronically Signed   By: Earle Gell M.D.   On: 03/18/2015 17:43   Ct Angio Chest Pe W/cm &/or Wo Cm  03/19/2015   CLINICAL DATA:  Shortness of breath x several days, pulmonary infiltrate on chest radiograph  EXAM: CT ANGIOGRAPHY CHEST WITH CONTRAST  TECHNIQUE: Multidetector CT imaging of the chest was performed using the standard protocol during bolus administration of intravenous contrast. Multiplanar CT image reconstructions and MIPs were obtained to evaluate the vascular anatomy.  CONTRAST:  76mL OMNIPAQUE IOHEXOL 350 MG/ML SOLN  COMPARISON:  Chest radiograph dated 03/18/2015  FINDINGS: No evidence of pulmonary embolism.  Mediastinum/Nodes: Cardiomegaly.  Small pericardial effusion.  Left subclavian ICD.  Atherosclerotic calcifications of the aortic arch.  Small mediastinal lymph nodes, favored to be reactive, including:  --9 mm short axis prevascular node (series 5/ image 33)  --10 mm short axis subcarinal node (series 5/ image 43)  --13 mm short axis left hilar node (series 5/image 47)  Enlarged left axillary nodes measuring up to 15  mm short axis (series 5/ image 26), abnormal, although poorly evaluated due to streak artifact from the patient's pacemaker.  Small right axillary nodes.  Small left supraclavicular nodes measuring up to 8 mm short axis (series 5/ image 11), within the upper limits of normal.  Lungs/Pleura: Mild patchy opacities in the lingula and bilateral lower lobes, likely atelectasis.  No focal consolidation.  8 mm pleural-based nodule overlying the right middle lobe (series 12/image 54), unchanged since 2010.  No suspicious pulmonary nodules.  No frank interstitial edema.  No pleural effusion or pneumothorax.  Upper abdomen: Visualized upper abdomen is unremarkable.  Musculoskeletal: Degenerative changes of the visualized thoracolumbar spine.  Review of the MIP images confirms the above findings.  IMPRESSION: No evidence of pulmonary embolism.  No frank interstitial edema.  Small supraclavicular and thoracic lymph nodes, most of which can be considered reactive, although left axillary nodes are enlarged up to 15 mm which is considered abnormal. Correlate for signs/symptoms of lymphoproliferative disorder or left breast malignancy.   Electronically Signed   By: Julian Hy M.D.   On: 03/19/2015 18:08   Nm Myocar Single W/spect W/wall Motion And Ef  03/20/2015    This is a low risk study.  Defect 1: There is a defect present in the mid anteroseptal, apical  anterior and apical septal location.   Rest only imaging.  There is a moderate region of decreased radiotracer  uptake in the anteroseptal wall of the mid ventricle and apex which could  represent breast attenuation artifact or a region of prior infarct/scar.    Dg Chest Port 1 View  03/18/2015   CLINICAL DATA:  Dyspnea, onset tonight  EXAM: PORTABLE CHEST - 1 VIEW  COMPARISON:  03/01/2015  FINDINGS: There is mild unchanged cardiomegaly. There are intact appearances of the transvenous leads.  There is new right base infiltrate and this may represent pneumonia.  There is no large effusion. The left lung is clear.  IMPRESSION: Right base infiltrate.   Electronically Signed   By: Andreas Newport M.D.   On: 03/18/2015 04:09   Dg Chest Port 1 View  03/01/2015   CLINICAL DATA:  Sudden onset of fever today.  EXAM: PORTABLE CHEST - 1 VIEW  COMPARISON:  02/13/2009.  FINDINGS: Cardiac silhouette mildly enlarged. No mediastinal hilar masses. Left anterior chest wall sequential pacemaker leads project over the right atrium right  ventricle.  No lung consolidation or edema. No pleural effusion or pneumothorax.  Bony thorax is demineralized but grossly intact.  IMPRESSION: No acute cardiopulmonary disease.   Electronically Signed   By: Lajean Manes M.D.   On: 03/01/2015 21:20     CBC  Recent Labs Lab 03/19/15 0235 03/20/15 0500 03/20/15 1325 03/20/15 2210 03/21/15 0510  WBC 5.4 5.1 5.3 5.7 5.5  HGB 8.1* 7.4* 7.8* 7.8* 7.8*  HCT 26.7* 24.4* 24.6* 25.3* 25.4*  PLT 321 349 294 343 332  MCV 85.9 86.5 85.4 87.2 86.7  MCH 26.0 26.2 27.1 26.9 26.6  MCHC 30.3 30.3 31.7 30.8 30.7  RDW 17.0* 17.2* 17.1* 17.2* 17.3*    Chemistries   Recent Labs Lab 03/18/15 0305 03/20/15 0500 03/20/15 1325 03/21/15 0510  NA 137 138  --  139  K 4.3 3.3*  --  3.4*  CL 103 98*  --  98*  CO2 23 28  --  31  GLUCOSE 167* 69  --  81  BUN 10 10  --  11  CREATININE 0.94 1.26*  --  1.11*  CALCIUM 9.4 8.7*  --  9.0  MG  --   --  1.3*  --   AST  --  12*  --  14*  ALT  --  9*  --  9*  ALKPHOS  --  41  --  40  BILITOT  --  0.3  --  0.3   ------------------------------------------------------------------------------------------------------------------ estimated creatinine clearance is 45.8 mL/min (by C-G formula based on Cr of 1.11). ------------------------------------------------------------------------------------------------------------------ No results for input(s): HGBA1C in the last 72  hours. ------------------------------------------------------------------------------------------------------------------ No results for input(s): CHOL, HDL, LDLCALC, TRIG, CHOLHDL, LDLDIRECT in the last 72 hours. ------------------------------------------------------------------------------------------------------------------ No results for input(s): TSH, T4TOTAL, T3FREE, THYROIDAB in the last 72 hours.  Invalid input(s): FREET3 ------------------------------------------------------------------------------------------------------------------ No results for input(s): VITAMINB12, FOLATE, FERRITIN, TIBC, IRON, RETICCTPCT in the last 72 hours.  Coagulation profile No results for input(s): INR, PROTIME in the last 168 hours.  No results for input(s): DDIMER in the last 72 hours.  Cardiac Enzymes  Recent Labs Lab 03/18/15 1428 03/18/15 2008 03/19/15 0235  TROPONINI 0.04* 0.04* 0.03   ------------------------------------------------------------------------------------------------------------------ Invalid input(s): POCBNP   CBG: No results for input(s): GLUCAP in the last 168 hours.     Studies: Ct Angio Chest Pe W/cm &/or Wo Cm  03/19/2015   CLINICAL DATA:  Shortness of breath x several days, pulmonary infiltrate on chest radiograph  EXAM: CT ANGIOGRAPHY CHEST WITH CONTRAST  TECHNIQUE: Multidetector CT imaging of the chest was performed using the standard protocol during bolus administration of intravenous contrast. Multiplanar CT image reconstructions and MIPs were obtained to evaluate the vascular anatomy.  CONTRAST:  60mL OMNIPAQUE IOHEXOL 350 MG/ML SOLN  COMPARISON:  Chest radiograph dated 03/18/2015  FINDINGS: No evidence of pulmonary embolism.  Mediastinum/Nodes: Cardiomegaly.  Small pericardial effusion.  Left subclavian ICD.  Atherosclerotic calcifications of the aortic arch.  Small mediastinal lymph nodes, favored to be reactive, including:  --9 mm short axis prevascular node  (series 5/ image 33)  --10 mm short axis subcarinal node (series 5/ image 43)  --13 mm short axis left hilar node (series 5/image 47)  Enlarged left axillary nodes measuring up to 15 mm short axis (series 5/ image 26), abnormal, although poorly evaluated due to streak artifact from the patient's pacemaker.  Small right axillary nodes.  Small left supraclavicular nodes measuring up to 8 mm short axis (series 5/ image 11), within the upper limits  of normal.  Lungs/Pleura: Mild patchy opacities in the lingula and bilateral lower lobes, likely atelectasis.  No focal consolidation.  8 mm pleural-based nodule overlying the right middle lobe (series 12/image 54), unchanged since 2010.  No suspicious pulmonary nodules.  No frank interstitial edema.  No pleural effusion or pneumothorax.  Upper abdomen: Visualized upper abdomen is unremarkable.  Musculoskeletal: Degenerative changes of the visualized thoracolumbar spine.  Review of the MIP images confirms the above findings.  IMPRESSION: No evidence of pulmonary embolism.  No frank interstitial edema.  Small supraclavicular and thoracic lymph nodes, most of which can be considered reactive, although left axillary nodes are enlarged up to 15 mm which is considered abnormal. Correlate for signs/symptoms of lymphoproliferative disorder or left breast malignancy.   Electronically Signed   By: Julian Hy M.D.   On: 03/19/2015 18:08   Nm Myocar Single W/spect W/wall Motion And Ef  03/20/2015    This is a low risk study.  Defect 1: There is a defect present in the mid anteroseptal, apical  anterior and apical septal location.   Rest only imaging.  There is a moderate region of decreased radiotracer  uptake in the anteroseptal wall of the mid ventricle and apex which could  represent breast attenuation artifact or a region of prior infarct/scar.       No results found for: HGBA1C Lab Results  Component Value Date   [facility]  02/11/2009    91        Total  Cholesterol/HDL:CHD Risk Coronary Heart Disease Risk Table                     Men   Women  1/2 Average Risk   3.4   3.3  Average Risk       5.0   4.4  2 X Average Risk   9.6   7.1  3 X Average Risk  23.4   11.0        Use the calculated Patient Ratio above and the CHD Risk Table to determine the patient's CHD Risk.        ATP III CLASSIFICATION (LDL):  <100     mg/dL   Optimal  100-129  mg/dL   Near or Above                    Optimal  130-159  mg/dL   Borderline  160-189  mg/dL   High  >190     mg/dL   Very High   CREATININE 1.11* 03/21/2015       Scheduled Meds: . sodium chloride   Intravenous Once  . antiseptic oral rinse  7 mL Mouth Rinse q12n4p  . aspirin EC  81 mg Oral Daily  . atorvastatin  20 mg Oral Daily  . carvedilol  12.5 mg Oral BID WC  . chlorhexidine  15 mL Mouth Rinse BID  . enoxaparin (LOVENOX) injection  40 mg Subcutaneous Q24H  . famotidine  20 mg Oral Daily  . feeding supplement (ENSURE ENLIVE)  237 mL Oral BID BM  . ferrous sulfate  325 mg Oral Q breakfast  . fluticasone  1 spray Each Nare Daily  . furosemide  40 mg Intravenous BID  . isosorbide mononitrate  30 mg Oral Daily  . levothyroxine  50 mcg Oral QAC breakfast  . lisinopril  5 mg Oral Daily  . mometasone-formoterol  2 puff Inhalation BID  . potassium chloride  40 mEq Oral Once  .  regadenoson  0.4 mg Intravenous Once  . sodium chloride  3 mL Intravenous Q12H   Continuous Infusions:    Principal Problem:   Acute CHF Active Problems:   Anemia   HTN (hypertension)   DM (dermatomyositis)   Colostomy in place   COPD (chronic obstructive pulmonary disease)   Pulmonary infiltrate on chest x-ray   Acute respiratory failure, unspecified whether with hypoxia or hypercapnia    Time spent: 45 minutes   [facility] Pager [phone]. If 7PM-7AM, please contact night-coverage at [email or web address], password [facility] 03/21/2015, 11:02 AM  LOS: 3 days

## 2015-03-22 ENCOUNTER — Encounter (HOSPITAL_COMMUNITY)
Admission: EM | Disposition: A | Discharge status: Skilled Nursing Facility | Payer: Medicare HMO | Source: Home / Self Care | Attending: Internal Medicine

## 2015-03-22 ENCOUNTER — Encounter (HOSPITAL_COMMUNITY): Payer: Self-pay | Admitting: Cardiology

## 2015-03-22 DIAGNOSIS — I2510 Atherosclerotic heart disease of native coronary artery without angina pectoris: Secondary | ICD-10-CM

## 2015-03-22 DIAGNOSIS — I509 Heart failure, unspecified: Secondary | ICD-10-CM

## 2015-03-22 HISTORY — PX: CARDIAC CATHETERIZATION: SHX172

## 2015-03-22 LAB — TYPE AND SCREEN
ABO/RH(D): B POS
ANTIBODY SCREEN: NEGATIVE
Unit division: 0

## 2015-03-22 LAB — POCT I-STAT 3, ART BLOOD GAS (G3+)
Acid-Base Excess: 5 mmol/L — ABNORMAL HIGH (ref 0.0–2.0)
Bicarbonate: 30.4 mEq/L — ABNORMAL HIGH (ref 20.0–24.0)
O2 Saturation: 99 %
PO2 ART: 119 mmHg — AB (ref 80.0–100.0)
TCO2: 32 mmol/L (ref 0–100)
pCO2 arterial: 46.6 mmHg — ABNORMAL HIGH (ref 35.0–45.0)
pH, Arterial: 7.423 (ref 7.350–7.450)

## 2015-03-22 LAB — COMPREHENSIVE METABOLIC PANEL
ALK PHOS: 41 U/L (ref 38–126)
ALT: 10 U/L — ABNORMAL LOW (ref 14–54)
AST: 15 U/L (ref 15–41)
Albumin: 2.8 g/dL — ABNORMAL LOW (ref 3.5–5.0)
Anion gap: 9 (ref 5–15)
BILIRUBIN TOTAL: 0.6 mg/dL (ref 0.3–1.2)
BUN: 10 mg/dL (ref 6–20)
CHLORIDE: 100 mmol/L — AB (ref 101–111)
CO2: 32 mmol/L (ref 22–32)
CREATININE: 1.02 mg/dL — AB (ref 0.44–1.00)
Calcium: 9.4 mg/dL (ref 8.9–10.3)
GFR calc Af Amer: 59 mL/min — ABNORMAL LOW (ref >60)
GFR, EST NON AFRICAN AMERICAN: 51 mL/min — AB (ref >60)
Glucose, Bld: 82 mg/dL (ref 65–99)
POTASSIUM: 3.7 mmol/L (ref 3.5–5.1)
Sodium: 141 mmol/L (ref 135–145)
Total Protein: 6.8 g/dL (ref 6.5–8.1)

## 2015-03-22 LAB — CBC
HEMATOCRIT: 30.4 % — AB (ref 36.0–46.0)
Hemoglobin: 9.2 g/dL — ABNORMAL LOW (ref 12.0–15.0)
MCH: 26.1 pg (ref 26.0–34.0)
MCHC: 30.3 g/dL (ref 30.0–36.0)
MCV: 86.1 fL (ref 78.0–100.0)
Platelets: 311 10*3/uL (ref 150–400)
RBC: 3.53 MIL/uL — AB (ref 3.87–5.11)
RDW: 17.2 % — ABNORMAL HIGH (ref 11.5–15.5)
WBC: 5.2 10*3/uL (ref 4.0–10.5)

## 2015-03-22 LAB — GLUCOSE, CAPILLARY: Glucose-Capillary: 75 mg/dL (ref 65–99)

## 2015-03-22 LAB — POCT I-STAT 3, VENOUS BLOOD GAS (G3P V)
Acid-Base Excess: 5 mmol/L — ABNORMAL HIGH (ref 0.0–2.0)
Bicarbonate: 30.2 mEq/L — ABNORMAL HIGH (ref 20.0–24.0)
O2 Saturation: 60 %
PH VEN: 7.397 — AB (ref 7.250–7.300)
TCO2: 32 mmol/L (ref 0–100)
pCO2, Ven: 49.1 mmHg (ref 45.0–50.0)
pO2, Ven: 32 mmHg (ref 30.0–45.0)

## 2015-03-22 LAB — OCCULT BLOOD X 1 CARD TO LAB, STOOL: Fecal Occult Bld: NEGATIVE

## 2015-03-22 SURGERY — RIGHT/LEFT HEART CATH AND CORONARY ANGIOGRAPHY
Anesthesia: LOCAL

## 2015-03-22 MED ORDER — LIDOCAINE HCL (PF) 1 % IJ SOLN
INTRAMUSCULAR | Status: AC
  Filled 2015-03-22: qty 30

## 2015-03-22 MED ORDER — FENTANYL CITRATE (PF) 100 MCG/2ML IJ SOLN
INTRAMUSCULAR | Status: AC
  Filled 2015-03-22: qty 2

## 2015-03-22 MED ORDER — MIDAZOLAM HCL 2 MG/2ML IJ SOLN
INTRAMUSCULAR | Status: AC
  Filled 2015-03-22: qty 2

## 2015-03-22 MED ORDER — ENOXAPARIN SODIUM 40 MG/0.4ML ~~LOC~~ SOLN
40.0000 mg | SUBCUTANEOUS | Status: DC
Start: 2015-03-23 — End: 2015-03-24
  Administered 2015-03-23 – 2015-03-24 (×2): 40 mg via SUBCUTANEOUS
  Filled 2015-03-22 (×3): qty 0.4

## 2015-03-22 MED ORDER — SODIUM CHLORIDE 0.9 % WEIGHT BASED INFUSION: 1.0000 mL/kg/h | INTRAVENOUS | Status: AC

## 2015-03-22 MED ORDER — SODIUM CHLORIDE 0.9 % IJ SOLN
3.0000 mL | INTRAMUSCULAR | Status: DC | PRN
Start: 2015-03-22 — End: 2015-03-24

## 2015-03-22 MED ORDER — LIDOCAINE HCL (PF) 1 % IJ SOLN
INTRAMUSCULAR | Status: DC | PRN
  Administered 2015-03-22: 30 mL

## 2015-03-22 MED ORDER — SODIUM CHLORIDE 0.9 % IJ SOLN
3.0000 mL | Freq: Two times a day (BID) | INTRAMUSCULAR | Status: DC
  Administered 2015-03-22 – 2015-03-24 (×5): 3 mL via INTRAVENOUS

## 2015-03-22 MED ORDER — MIDAZOLAM HCL 2 MG/2ML IJ SOLN
INTRAMUSCULAR | Status: DC | PRN
  Administered 2015-03-22: 1 mg via INTRAVENOUS

## 2015-03-22 MED ORDER — HEPARIN (PORCINE) IN NACL 2-0.9 UNIT/ML-% IJ SOLN
INTRAMUSCULAR | Status: AC
  Filled 2015-03-22: qty 1000

## 2015-03-22 MED ORDER — IOHEXOL 350 MG/ML SOLN
INTRAVENOUS | Status: DC | PRN
  Administered 2015-03-22: 130 mL via INTRA_ARTERIAL

## 2015-03-22 MED ORDER — PANTOPRAZOLE SODIUM 40 MG PO TBEC
40.0000 mg | DELAYED_RELEASE_TABLET | Freq: Two times a day (BID) | ORAL | Status: DC
  Administered 2015-03-22 – 2015-03-24 (×5): 40 mg via ORAL
  Filled 2015-03-22 (×5): qty 1

## 2015-03-22 MED ORDER — SODIUM CHLORIDE 0.9 % IV SOLN: 250.0000 mL | INTRAVENOUS | Status: DC | PRN

## 2015-03-22 MED ORDER — FENTANYL CITRATE (PF) 100 MCG/2ML IJ SOLN
INTRAMUSCULAR | Status: DC | PRN
  Administered 2015-03-22: 25 ug via INTRAVENOUS

## 2015-03-22 SURGICAL SUPPLY — 14 items
CATH INFINITI 5 FR AL2 (CATHETERS) ×1 IMPLANT
CATH INFINITI 5FR AL1 (CATHETERS) ×1 IMPLANT
CATH INFINITI 5FR MULTPACK ANG (CATHETERS) ×1 IMPLANT
CATH SWAN GANZ 7F STRAIGHT (CATHETERS) ×1 IMPLANT
KIT HEART LEFT (KITS) ×2 IMPLANT
PACK CARDIAC CATHETERIZATION (CUSTOM PROCEDURE TRAY) ×2 IMPLANT
SHEATH PINNACLE 5F 10CM (SHEATH) ×1 IMPLANT
SHEATH PINNACLE 7F 10CM (SHEATH) ×1 IMPLANT
SYR MEDRAD MARK V 150ML (SYRINGE) ×2 IMPLANT
TRANSDUCER W/STOPCOCK (MISCELLANEOUS) ×4 IMPLANT
TUBING ART PRESS 72  MALE/FEM (TUBING) ×1
TUBING ART PRESS 72 MALE/FEM (TUBING) IMPLANT
TUBING CIL FLEX 10 FLL-RA (TUBING) ×2 IMPLANT
WIRE EMERALD 3MM-J .035X150CM (WIRE) ×1 IMPLANT

## 2015-03-22 NOTE — H&P (View-Only) (Signed)
Patient Name: [name] Date of Encounter: 03/22/2015  Primary Cardiologist: New   Principal Problem:   Acute CHF Active Problems:   Anemia   HTN (hypertension)   DM (dermatomyositis)   Colostomy in place   COPD (chronic obstructive pulmonary disease)   Pulmonary infiltrate on chest x-ray   Acute respiratory failure, unspecified whether with hypoxia or hypercapnia    SUBJECTIVE  Denies any CP or SOB. Denies any bleeding recently.   CURRENT MEDS . antiseptic oral rinse  7 mL Mouth Rinse q12n4p  . [START ON 03/23/2015] aspirin EC  81 mg Oral Daily  . atorvastatin  20 mg Oral Daily  . carvedilol  12.5 mg Oral BID WC  . chlorhexidine  15 mL Mouth Rinse BID  . enoxaparin (LOVENOX) injection  40 mg Subcutaneous Q24H  . famotidine  20 mg Oral Daily  . feeding supplement (ENSURE ENLIVE)  237 mL Oral BID BM  . ferrous sulfate  325 mg Oral Q breakfast  . fluticasone  1 spray Each Nare Daily  . furosemide  40 mg Intravenous BID  . isosorbide mononitrate  30 mg Oral Daily  . levothyroxine  50 mcg Oral QAC breakfast  . lisinopril  5 mg Oral Daily  . mometasone-formoterol  2 puff Inhalation BID  . regadenoson  0.4 mg Intravenous Once  . sodium chloride  3 mL Intravenous Q12H    OBJECTIVE  Filed Vitals:   03/21/15 1943 03/21/15 2108 03/22/15 0000 03/22/15 0400  BP: 135/53  138/56 161/73  Pulse: 81  74 83  Temp: 98.8 F (37.1 C)  99 F (37.2 C) 98.5 F (36.9 C)  TempSrc: Oral  Oral Oral  Resp: 16  18   Height:    5\' 3"  (1.6 m)  Weight:    179 lb 12.8 oz (81.557 kg)  SpO2: 100% 100% 100% 100%    Intake/Output Summary (Last 24 hours) at 03/22/15 0813 Last data filed at 03/22/15 0659  Gross per 24 hour  Intake  988.5 ml  Output      0 ml  Net  988.5 ml   Filed Weights   03/19/15 0500 03/21/15 0500 03/22/15 0400  Weight: 191 lb 5.8 oz (86.8 kg) 187 lb 3.2 oz (84.913 kg) 179 lb 12.8 oz (81.557 kg)    PHYSICAL EXAM  General: Pleasant, NAD. Neuro: Alert and  oriented X 3. Moves all extremities spontaneously. Psych: Normal affect. HEENT:  Normal  Neck: Supple without bruits  +mild JVD. Lungs:  Resp regular and unlabored. Bibasilar rale.  Heart: RRR no s3, s4, or murmurs. Abdomen: Soft, non-tender, non-distended, BS + x 4.  Extremities: No clubbing, cyanosis or edema. DP/PT/Radials 2+ and equal bilaterally.  Accessory Clinical Findings  CBC  Recent Labs  03/21/15 0510 03/22/15 0643  WBC 5.5 5.2  HGB 7.8* 9.2*  HCT 25.4* 30.4*  MCV 86.7 86.1  PLT 332 947   Basic Metabolic Panel  Recent Labs  03/20/15 1325 03/21/15 0510 03/22/15 0643  NA  --  139 141  K  --  3.4* 3.7  CL  --  98* 100*  CO2  --  31 32  GLUCOSE  --  81 82  BUN  --  11 10  CREATININE  --  1.11* 1.02*  CALCIUM  --  9.0 9.4  MG 1.3*  --   --    Liver Function Tests  Recent Labs  03/21/15 0510 03/22/15 0643  AST 14* 15  ALT 9* 10*  ALKPHOS 40 41  BILITOT 0.3 0.6  PROT 6.6 6.8  ALBUMIN 2.8* 2.8*    TELE ventricularly paced rhythm 80    ECG  No new EKG  Echocardiogram 03/19/2015  LV EF: 35% -  40%  ------------------------------------------------------------------- Indications:   Dyspnea 786.09.  ------------------------------------------------------------------- History:  PMH:  Congestive heart failure. Chronic obstructive pulmonary disease. Risk factors: Hypertension. Dyslipidemia.  ------------------------------------------------------------------- Study Conclusions  - Left ventricle: Technically limited study. Hypokinesis of the inferior wall, and the septum and the anterior wall. The cavity size was normal. Wall thickness was increased in a pattern of moderate LVH. Systolic function was moderately reduced. The estimated ejection fraction was in the range of 35% to 40%. - Aortic valve: Sclerosis without stenosis. There was trivial regurgitation. - Mitral valve: Moderately calcified annulus. There was no  evidence for stenosis. - Right ventricle: The cavity size was mildly dilated. Pacer wire or catheter noted in right ventricle. Systolic function was normal. - Right atrium: The atrium was mildly dilated. - Pericardium, extracardiac: There is small/moderate posterior pericardial effusion.    Radiology/Studies  Dg Chest 2 View  03/18/2015   CLINICAL DATA:  Followup right lower lobe pneumonia. Congestive heart failure. COPD.  EXAM: CHEST  2 VIEW  COMPARISON:  Prior today on 03/18/2015  FINDINGS: Cardiomegaly is stable. Dual lead transvenous pacemaker remains in appropriate position.  There is mild improvement in airspace opacity in the right lower lobe since previous study. Diffuse pulmonary vascular congestion is demonstrated, without evidence of frank pulmonary edema.  IMPRESSION: Mild improvement in airspace opacity in right lower lobe since prior exam.  Stable cardiomegaly and diffuse pulmonary vascular congestion.   Electronically Signed   By: Earle Gell M.D.   On: 03/18/2015 17:43   Ct Angio Chest Pe W/cm &/or Wo Cm  03/19/2015   CLINICAL DATA:  Shortness of breath x several days, pulmonary infiltrate on chest radiograph  EXAM: CT ANGIOGRAPHY CHEST WITH CONTRAST  TECHNIQUE: Multidetector CT imaging of the chest was performed using the standard protocol during bolus administration of intravenous contrast. Multiplanar CT image reconstructions and MIPs were obtained to evaluate the vascular anatomy.  CONTRAST:  18mL OMNIPAQUE IOHEXOL 350 MG/ML SOLN  COMPARISON:  Chest radiograph dated 03/18/2015  FINDINGS: No evidence of pulmonary embolism.  Mediastinum/Nodes: Cardiomegaly.  Small pericardial effusion.  Left subclavian ICD.  Atherosclerotic calcifications of the aortic arch.  Small mediastinal lymph nodes, favored to be reactive, including:  --9 mm short axis prevascular node (series 5/ image 33)  --10 mm short axis subcarinal node (series 5/ image 43)  --13 mm short axis left hilar node  (series 5/image 47)  Enlarged left axillary nodes measuring up to 15 mm short axis (series 5/ image 26), abnormal, although poorly evaluated due to streak artifact from the patient's pacemaker.  Small right axillary nodes.  Small left supraclavicular nodes measuring up to 8 mm short axis (series 5/ image 11), within the upper limits of normal.  Lungs/Pleura: Mild patchy opacities in the lingula and bilateral lower lobes, likely atelectasis.  No focal consolidation.  8 mm pleural-based nodule overlying the right middle lobe (series 12/image 54), unchanged since 2010.  No suspicious pulmonary nodules.  No frank interstitial edema.  No pleural effusion or pneumothorax.  Upper abdomen: Visualized upper abdomen is unremarkable.  Musculoskeletal: Degenerative changes of the visualized thoracolumbar spine.  Review of the MIP images confirms the above findings.  IMPRESSION: No evidence of pulmonary embolism.  No frank interstitial edema.  Small supraclavicular and thoracic  lymph nodes, most of which can be considered reactive, although left axillary nodes are enlarged up to 15 mm which is considered abnormal. Correlate for signs/symptoms of lymphoproliferative disorder or left breast malignancy.   Electronically Signed   By: Julian Hy M.D.   On: 03/19/2015 18:08   Nm Myocar Single W/spect W/wall Motion And Ef  03/20/2015    This is a low risk study.  Defect 1: There is a defect present in the mid anteroseptal, apical  anterior and apical septal location.   Rest only imaging.  There is a moderate region of decreased radiotracer  uptake in the anteroseptal wall of the mid ventricle and apex which could  represent breast attenuation artifact or a region of prior infarct/scar.    Dg Chest Port 1 View  03/18/2015   CLINICAL DATA:  Dyspnea, onset tonight  EXAM: PORTABLE CHEST - 1 VIEW  COMPARISON:  03/01/2015  FINDINGS: There is mild unchanged cardiomegaly. There are intact appearances of the transvenous leads.   There is new right base infiltrate and this may represent pneumonia. There is no large effusion. The left lung is clear.  IMPRESSION: Right base infiltrate.   Electronically Signed   By: Andreas Newport M.D.   On: 03/18/2015 04:09   Dg Chest Port 1 View  03/01/2015   CLINICAL DATA:  Sudden onset of fever today.  EXAM: PORTABLE CHEST - 1 VIEW  COMPARISON:  02/13/2009.  FINDINGS: Cardiac silhouette mildly enlarged. No mediastinal hilar masses. Left anterior chest wall sequential pacemaker leads project over the right atrium right ventricle.  No lung consolidation or edema. No pleural effusion or pneumothorax.  Bony thorax is demineralized but grossly intact.  IMPRESSION: No acute cardiopulmonary disease.   Electronically Signed   By: Lajean Manes M.D.   On: 03/01/2015 21:20    ASSESSMENT AND PLAN  81 y.o. female with Past medical history of hypertension, asthma, ? Prior CHF managed by her PCP, COPD, hypothyroidism, anemia, colostomy, chronic kidney disease. She presented with complaints of sudden onset of shortness of breath associated with diaphoresis, no chest pain. Did resting portion of myoview on 6/9 before d-dimer came positive at 4.6. Stress portion cancelled and CTA ordered 6/10, negative for PE. Pending L/R HC today.   1. Acute systolic CHF  - echo 1/44/8185 EF 35-40%, hypokinesis of inferior wall, septal wall, small/moderate pericardial effusion  - continue coreg, imdur, and lisinopril. ASA and statin - will discuss with MD regarding L&RHC today, under normal circumstance, she will benefit from cath, however unclear cause for anemia, per IM, felt this is anemia of chronic disease. Given recent transfusion, cath with potential possibility of stenting may not be good idea until GI workup.  Addendum: discussed with Dr. Gwenlyn Found, hgb drop from 9.9 to 7.4 higher than expected from just anemia of chronic disease alone, however will go ahead with diagnostic cath and RHC, will likely do staged PCI  until we can figure out if she's bleeding or not.   2. Anemia Hgb dropped from 9.9 to 8.1. Needs further w/u by primary service   3. HTN  Labile. Continue to monitor. Coreg 12.5 BID, lisinopril 5QD  4. Colostomy in place 5. COPD (chronic obstructive pulmonary disease) 6. HLD Lipitor.  7. AICD: (Dr. Dalbert Batman in [place] in 2005) medtr[employer] normal function   8. dermatomyositis  Signed, Almyra Deforest PA-C Pager: [phone]  Agree with note by Almyra Deforest PA-C  Discussed drop of HGB requiring transfusion. With nl MCV worry that this  might be more acute than chronic. Pt getting R/L heart cath today for decreased LV fxn and inf WMA. If she has anatomy requiring intervention and it's not critical, may want to stage intervention to determine etiology/ bleeding source.   Lorretta Harp, M.D., [place], [facility], [place] [address] [place], [place]  [address]  [phone] 03/22/2015 9:30 AM

## 2015-03-22 NOTE — Progress Notes (Signed)
Patient Name: [name] Date of Encounter: 03/22/2015  Primary Cardiologist: New   Principal Problem:   Acute CHF Active Problems:   Anemia   HTN (hypertension)   DM (dermatomyositis)   Colostomy in place   COPD (chronic obstructive pulmonary disease)   Pulmonary infiltrate on chest x-ray   Acute respiratory failure, unspecified whether with hypoxia or hypercapnia    SUBJECTIVE  Denies any CP or SOB. Denies any bleeding recently.   CURRENT MEDS . antiseptic oral rinse  7 mL Mouth Rinse q12n4p  . [START ON 03/23/2015] aspirin EC  81 mg Oral Daily  . atorvastatin  20 mg Oral Daily  . carvedilol  12.5 mg Oral BID WC  . chlorhexidine  15 mL Mouth Rinse BID  . enoxaparin (LOVENOX) injection  40 mg Subcutaneous Q24H  . famotidine  20 mg Oral Daily  . feeding supplement (ENSURE ENLIVE)  237 mL Oral BID BM  . ferrous sulfate  325 mg Oral Q breakfast  . fluticasone  1 spray Each Nare Daily  . furosemide  40 mg Intravenous BID  . isosorbide mononitrate  30 mg Oral Daily  . levothyroxine  50 mcg Oral QAC breakfast  . lisinopril  5 mg Oral Daily  . mometasone-formoterol  2 puff Inhalation BID  . regadenoson  0.4 mg Intravenous Once  . sodium chloride  3 mL Intravenous Q12H    OBJECTIVE  Filed Vitals:   03/21/15 1943 03/21/15 2108 03/22/15 0000 03/22/15 0400  BP: 135/53  138/56 161/73  Pulse: 81  74 83  Temp: 98.8 F (37.1 C)  99 F (37.2 C) 98.5 F (36.9 C)  TempSrc: Oral  Oral Oral  Resp: 16  18   Height:    5\' 3"  (1.6 m)  Weight:    179 lb 12.8 oz (81.557 kg)  SpO2: 100% 100% 100% 100%    Intake/Output Summary (Last 24 hours) at 03/22/15 0813 Last data filed at 03/22/15 0659  Gross per 24 hour  Intake  988.5 ml  Output      0 ml  Net  988.5 ml   Filed Weights   03/19/15 0500 03/21/15 0500 03/22/15 0400  Weight: 191 lb 5.8 oz (86.8 kg) 187 lb 3.2 oz (84.913 kg) 179 lb 12.8 oz (81.557 kg)    PHYSICAL EXAM  General: Pleasant, NAD. Neuro: Alert and  oriented X 3. Moves all extremities spontaneously. Psych: Normal affect. HEENT:  Normal  Neck: Supple without bruits  +mild JVD. Lungs:  Resp regular and unlabored. Bibasilar rale.  Heart: RRR no s3, s4, or murmurs. Abdomen: Soft, non-tender, non-distended, BS + x 4.  Extremities: No clubbing, cyanosis or edema. DP/PT/Radials 2+ and equal bilaterally.  Accessory Clinical Findings  CBC  Recent Labs  03/21/15 0510 03/22/15 0643  WBC 5.5 5.2  HGB 7.8* 9.2*  HCT 25.4* 30.4*  MCV 86.7 86.1  PLT 332 885   Basic Metabolic Panel  Recent Labs  03/20/15 1325 03/21/15 0510 03/22/15 0643  NA  --  139 141  K  --  3.4* 3.7  CL  --  98* 100*  CO2  --  31 32  GLUCOSE  --  81 82  BUN  --  11 10  CREATININE  --  1.11* 1.02*  CALCIUM  --  9.0 9.4  MG 1.3*  --   --    Liver Function Tests  Recent Labs  03/21/15 0510 03/22/15 0643  AST 14* 15  ALT 9* 10*  ALKPHOS 40 41  BILITOT 0.3 0.6  PROT 6.6 6.8  ALBUMIN 2.8* 2.8*    TELE ventricularly paced rhythm 80    ECG  No new EKG  Echocardiogram 03/19/2015  LV EF: 35% -  40%  ------------------------------------------------------------------- Indications:   Dyspnea 786.09.  ------------------------------------------------------------------- History:  PMH:  Congestive heart failure. Chronic obstructive pulmonary disease. Risk factors: Hypertension. Dyslipidemia.  ------------------------------------------------------------------- Study Conclusions  - Left ventricle: Technically limited study. Hypokinesis of the inferior wall, and the septum and the anterior wall. The cavity size was normal. Wall thickness was increased in a pattern of moderate LVH. Systolic function was moderately reduced. The estimated ejection fraction was in the range of 35% to 40%. - Aortic valve: Sclerosis without stenosis. There was trivial regurgitation. - Mitral valve: Moderately calcified annulus. There was no  evidence for stenosis. - Right ventricle: The cavity size was mildly dilated. Pacer wire or catheter noted in right ventricle. Systolic function was normal. - Right atrium: The atrium was mildly dilated. - Pericardium, extracardiac: There is small/moderate posterior pericardial effusion.    Radiology/Studies  Dg Chest 2 View  03/18/2015   CLINICAL DATA:  Followup right lower lobe pneumonia. Congestive heart failure. COPD.  EXAM: CHEST  2 VIEW  COMPARISON:  Prior today on 03/18/2015  FINDINGS: Cardiomegaly is stable. Dual lead transvenous pacemaker remains in appropriate position.  There is mild improvement in airspace opacity in the right lower lobe since previous study. Diffuse pulmonary vascular congestion is demonstrated, without evidence of frank pulmonary edema.  IMPRESSION: Mild improvement in airspace opacity in right lower lobe since prior exam.  Stable cardiomegaly and diffuse pulmonary vascular congestion.   Electronically Signed   By: Earle Gell M.D.   On: 03/18/2015 17:43   Ct Angio Chest Pe W/cm &/or Wo Cm  03/19/2015   CLINICAL DATA:  Shortness of breath x several days, pulmonary infiltrate on chest radiograph  EXAM: CT ANGIOGRAPHY CHEST WITH CONTRAST  TECHNIQUE: Multidetector CT imaging of the chest was performed using the standard protocol during bolus administration of intravenous contrast. Multiplanar CT image reconstructions and MIPs were obtained to evaluate the vascular anatomy.  CONTRAST:  28mL OMNIPAQUE IOHEXOL 350 MG/ML SOLN  COMPARISON:  Chest radiograph dated 03/18/2015  FINDINGS: No evidence of pulmonary embolism.  Mediastinum/Nodes: Cardiomegaly.  Small pericardial effusion.  Left subclavian ICD.  Atherosclerotic calcifications of the aortic arch.  Small mediastinal lymph nodes, favored to be reactive, including:  --9 mm short axis prevascular node (series 5/ image 33)  --10 mm short axis subcarinal node (series 5/ image 43)  --13 mm short axis left hilar node  (series 5/image 47)  Enlarged left axillary nodes measuring up to 15 mm short axis (series 5/ image 26), abnormal, although poorly evaluated due to streak artifact from the patient's pacemaker.  Small right axillary nodes.  Small left supraclavicular nodes measuring up to 8 mm short axis (series 5/ image 11), within the upper limits of normal.  Lungs/Pleura: Mild patchy opacities in the lingula and bilateral lower lobes, likely atelectasis.  No focal consolidation.  8 mm pleural-based nodule overlying the right middle lobe (series 12/image 54), unchanged since 2010.  No suspicious pulmonary nodules.  No frank interstitial edema.  No pleural effusion or pneumothorax.  Upper abdomen: Visualized upper abdomen is unremarkable.  Musculoskeletal: Degenerative changes of the visualized thoracolumbar spine.  Review of the MIP images confirms the above findings.  IMPRESSION: No evidence of pulmonary embolism.  No frank interstitial edema.  Small supraclavicular and thoracic  lymph nodes, most of which can be considered reactive, although left axillary nodes are enlarged up to 15 mm which is considered abnormal. Correlate for signs/symptoms of lymphoproliferative disorder or left breast malignancy.   Electronically Signed   By: Julian Hy M.D.   On: 03/19/2015 18:08   Nm Myocar Single W/spect W/wall Motion And Ef  03/20/2015    This is a low risk study.  Defect 1: There is a defect present in the mid anteroseptal, apical  anterior and apical septal location.   Rest only imaging.  There is a moderate region of decreased radiotracer  uptake in the anteroseptal wall of the mid ventricle and apex which could  represent breast attenuation artifact or a region of prior infarct/scar.    Dg Chest Port 1 View  03/18/2015   CLINICAL DATA:  Dyspnea, onset tonight  EXAM: PORTABLE CHEST - 1 VIEW  COMPARISON:  03/01/2015  FINDINGS: There is mild unchanged cardiomegaly. There are intact appearances of the transvenous leads.   There is new right base infiltrate and this may represent pneumonia. There is no large effusion. The left lung is clear.  IMPRESSION: Right base infiltrate.   Electronically Signed   By: Andreas Newport M.D.   On: 03/18/2015 04:09   Dg Chest Port 1 View  03/01/2015   CLINICAL DATA:  Sudden onset of fever today.  EXAM: PORTABLE CHEST - 1 VIEW  COMPARISON:  02/13/2009.  FINDINGS: Cardiac silhouette mildly enlarged. No mediastinal hilar masses. Left anterior chest wall sequential pacemaker leads project over the right atrium right ventricle.  No lung consolidation or edema. No pleural effusion or pneumothorax.  Bony thorax is demineralized but grossly intact.  IMPRESSION: No acute cardiopulmonary disease.   Electronically Signed   By: Lajean Manes M.D.   On: 03/01/2015 21:20    ASSESSMENT AND PLAN  81 y.o. female with Past medical history of hypertension, asthma, ? Prior CHF managed by her PCP, COPD, hypothyroidism, anemia, colostomy, chronic kidney disease. She presented with complaints of sudden onset of shortness of breath associated with diaphoresis, no chest pain. Did resting portion of myoview on 6/9 before d-dimer came positive at 4.6. Stress portion cancelled and CTA ordered 6/10, negative for PE. Pending L/R HC today.   1. Acute systolic CHF  - echo 6/71/2458 EF 35-40%, hypokinesis of inferior wall, septal wall, small/moderate pericardial effusion  - continue coreg, imdur, and lisinopril. ASA and statin - will discuss with MD regarding L&RHC today, under normal circumstance, she will benefit from cath, however unclear cause for anemia, per IM, felt this is anemia of chronic disease. Given recent transfusion, cath with potential possibility of stenting may not be good idea until GI workup.  Addendum: discussed with Dr. Gwenlyn Found, hgb drop from 9.9 to 7.4 higher than expected from just anemia of chronic disease alone, however will go ahead with diagnostic cath and RHC, will likely do staged PCI  until we can figure out if she's bleeding or not.   2. Anemia Hgb dropped from 9.9 to 8.1. Needs further w/u by primary service   3. HTN  Labile. Continue to monitor. Coreg 12.5 BID, lisinopril 5QD  4. Colostomy in place 5. COPD (chronic obstructive pulmonary disease) 6. HLD Lipitor.  7. AICD: (Dr. Dalbert Batman in [place] in 2005) medtr[employer] normal function   8. dermatomyositis  Signed, Almyra Deforest PA-C Pager: [phone]  Agree with note by Almyra Deforest PA-C  Discussed drop of HGB requiring transfusion. With nl MCV worry that this  might be more acute than chronic. Pt getting R/L heart cath today for decreased LV fxn and inf WMA. If she has anatomy requiring intervention and it's not critical, may want to stage intervention to determine etiology/ bleeding source.   Lorretta Harp, M.D., [place], [facility], [place] [address] [place],   [address]  [phone] 03/22/2015 9:30 AM

## 2015-03-22 NOTE — Progress Notes (Signed)
[facility] PROGRESS NOTE  [name] NFA:[id number] DOB: 10/12/1933 DOA: 03/18/2015 PCP: No primary care provider on file.  Assessment/Plan: Principal Problem:   Acute CHF Active Problems:   Anemia   HTN (hypertension)   DM (dermatomyositis)   Colostomy in place   COPD (chronic obstructive pulmonary disease)   Pulmonary infiltrate on chest x-ray   Acute respiratory failure, unspecified whether with hypoxia or hypercapnia       Acute systolic CHF -   - elevated BNP, LE edema and crackles on physical exam improving Continue Lasix 40 mg IV twice a day Echocardiogram shows an EF of 35-40%, management per cardiology right and left heart cath shows increased left ventricular end-diastolic pressure, CVP of 15, continue diuresis, no significant coronary artery disease  Abnormal d-dimer CT angiogram of the chest negative for PE Venous Doppler negative Heparin drip discontinued  Anemia of chronic disease, versus acute blood loss anemia  drop from 9.9-7.4, given abnormal troponin and possible underlying coronary artery disease, will transfuse 1 unit of packed red blood cells today, prior to the patient's cardiac cath No active bleeding, hemoglobin has stayed around 7.8, DC serial H&H Patient's heparin drip was discontinued 6/10, no evidence of active bleeding Continue heparin subcutaneous for DVT prophylaxis Stool occult pending, if positive will consult gastroenterology Will start patient on PPI   Abnormal troponin - nondiagnostic per cardiology, awaiting results of the nuclear study   negative  CT angiogram - baseline LBBB present a months ago, no prior ECGs - most probably secondary to CHF - continue ASA, atorvastatin, right and left heart cath for Monday  Pacemaker interrogation by cardiology, and   Hypertension  Improving, continue Coreg Lasix and lisinopril   Hyperlipidemia - continue atorvastatin   questionable pneumonia, chest x-ray does not show any  evidence of pneumonia, most likely atelectasis, stopped antibiotics   Code Status: full Family Communication: family updated about patient's clinical progress Disposition Plan: Anticipate discharge in one to 2 days   Brief narrative: 82 y.o. female with Past medical history of hypertension, asthma, ? Prior CHF managed by her PCP, COPD, hypothyroidism, anemia, colostomy, chronic kidney disease. There is FH of CHF in her grandmother. The patient presents with complaints of sudden onset of shortness of breath associated with diaphoresis, no chest pain. This happened while she was in bed.  She mentions that she was sleeping and suddenly woke up from her sleep as it felt hot.She denies any chest pain denies any cough denies any choking episode denies any fever denies any chills denies any diarrhea or constipation or abdominal pain or any focal deficit. She denies any changes in her medication.  She is minimally active, she lives in assisted living community, never goes outside and has her food delivered to her room. She works with a physical therapist twice a day. She has had similar symptoms in the past but can't define how often.   Consultants:  Cardiology  Procedures:  None  Antibiotics: Anti-infectives    Start     Dose/Rate Route Frequency Ordered Stop   03/19/15 0200  vancomycin (VANCOCIN) IVPB 750 mg/150 ml premix  Status:  Discontinued     750 mg 150 mL/hr over 60 Minutes Intravenous Every 12 hours 03/18/15 1318 03/20/15 1116   03/18/15 1330  vancomycin (VANCOCIN) 1,500 mg in sodium chloride 0.9 % 500 mL IVPB     1,500 mg 250 mL/hr over 120 Minutes Intravenous  Once 03/18/15 1319 03/18/15 1622  03/18/15 1000  piperacillin-tazobactam (ZOSYN) IVPB 3.375 g  Status:  Discontinued     3.375 g 12.5 mL/hr over 240 Minutes Intravenous Every 8 hours 03/18/15 0915 03/20/15 1116   03/18/15 1000  vancomycin (VANCOCIN) 1,500 mg in sodium chloride 0.9 % 500 mL IVPB  Status:  Discontinued      1,500 mg 250 mL/hr over 120 Minutes Intravenous  Once 03/18/15 0915 03/18/15 1319         HPI/Subjective: Denies any active chest pain, shortness of breath, no evidence of melena  or hematochezia  Objective: Filed Vitals:   03/22/15 1120 03/22/15 1125 03/22/15 1130 03/22/15 1135  BP: 178/72 169/65 166/68 164/69  Pulse:    77  Temp:      TempSrc:      Resp: 19 21 20 21   Height:      Weight:      SpO2: 100% 100% 100% 100%    Intake/Output Summary (Last 24 hours) at 03/22/15 1218 Last data filed at 03/22/15 0659  Gross per 24 hour  Intake  988.5 ml  Output      0 ml  Net  988.5 ml    Exam: General appearance: alert, cooperative and no distress Neck: No JVD Lungs: Basilar crackles. No wheeze Heart: regular rate and rhythm Abdomen: +BS, nontender Extremities: No pitting edema Pulses: 2+ and symmetric Skin: Warm and dry Neurologic: Grossly normal     Data Review   Micro Results Recent Results (from the past 240 hour(s))  MRSA PCR Screening     Status: None   Collection Time: 03/18/15  6:06 AM  Result Value Ref Range Status   MRSA by PCR NEGATIVE NEGATIVE Final    Comment:        The GeneXpert MRSA Assay (FDA approved for NASAL specimens only), is one component of a comprehensive MRSA colonization surveillance program. It is not intended to diagnose MRSA infection nor to guide or monitor treatment for MRSA infections.   Culture, blood (routine x 2)     Status: None (Preliminary result)   Collection Time: 03/18/15  7:05 AM  Result Value Ref Range Status   Specimen Description BLOOD RIGHT ANTECUBITAL  Final   Special Requests BOTTLES DRAWN AEROBIC ONLY 10CC  Final   Culture   Final           BLOOD CULTURE RECEIVED NO GROWTH TO DATE CULTURE WILL BE HELD FOR 5 DAYS BEFORE ISSUING A FINAL NEGATIVE REPORT Performed at [facility]    Report Status PENDING  Incomplete  Culture, blood (routine x 2)     Status: None (Preliminary result)    Collection Time: 03/18/15  7:20 AM  Result Value Ref Range Status   Specimen Description BLOOD LEFT ANTECUBITAL  Final   Special Requests BOTTLES DRAWN AEROBIC ONLY 10CC  Final   Culture   Final           BLOOD CULTURE RECEIVED NO GROWTH TO DATE CULTURE WILL BE HELD FOR 5 DAYS BEFORE ISSUING A FINAL NEGATIVE REPORT Performed at [facility]    Report Status PENDING  Incomplete    Radiology Reports Dg Chest 2 View  03/18/2015   CLINICAL DATA:  Followup right lower lobe pneumonia. Congestive heart failure. COPD.  EXAM: CHEST  2 VIEW  COMPARISON:  Prior today on 03/18/2015  FINDINGS: Cardiomegaly is stable. Dual lead transvenous pacemaker remains in appropriate position.  There is mild improvement in airspace opacity in the right lower lobe since previous study.  Diffuse pulmonary vascular congestion is demonstrated, without evidence of frank pulmonary edema.  IMPRESSION: Mild improvement in airspace opacity in right lower lobe since prior exam.  Stable cardiomegaly and diffuse pulmonary vascular congestion.   Electronically Signed   By: Earle Gell M.D.   On: 03/18/2015 17:43   Ct Angio Chest Pe W/cm &/or Wo Cm  03/19/2015   CLINICAL DATA:  Shortness of breath x several days, pulmonary infiltrate on chest radiograph  EXAM: CT ANGIOGRAPHY CHEST WITH CONTRAST  TECHNIQUE: Multidetector CT imaging of the chest was performed using the standard protocol during bolus administration of intravenous contrast. Multiplanar CT image reconstructions and MIPs were obtained to evaluate the vascular anatomy.  CONTRAST:  57mL OMNIPAQUE IOHEXOL 350 MG/ML SOLN  COMPARISON:  Chest radiograph dated 03/18/2015  FINDINGS: No evidence of pulmonary embolism.  Mediastinum/Nodes: Cardiomegaly.  Small pericardial effusion.  Left subclavian ICD.  Atherosclerotic calcifications of the aortic arch.  Small mediastinal lymph nodes, favored to be reactive, including:  --9 mm short axis prevascular node (series 5/ image 33)  --10  mm short axis subcarinal node (series 5/ image 43)  --13 mm short axis left hilar node (series 5/image 47)  Enlarged left axillary nodes measuring up to 15 mm short axis (series 5/ image 26), abnormal, although poorly evaluated due to streak artifact from the patient's pacemaker.  Small right axillary nodes.  Small left supraclavicular nodes measuring up to 8 mm short axis (series 5/ image 11), within the upper limits of normal.  Lungs/Pleura: Mild patchy opacities in the lingula and bilateral lower lobes, likely atelectasis.  No focal consolidation.  8 mm pleural-based nodule overlying the right middle lobe (series 12/image 54), unchanged since 2010.  No suspicious pulmonary nodules.  No frank interstitial edema.  No pleural effusion or pneumothorax.  Upper abdomen: Visualized upper abdomen is unremarkable.  Musculoskeletal: Degenerative changes of the visualized thoracolumbar spine.  Review of the MIP images confirms the above findings.  IMPRESSION: No evidence of pulmonary embolism.  No frank interstitial edema.  Small supraclavicular and thoracic lymph nodes, most of which can be considered reactive, although left axillary nodes are enlarged up to 15 mm which is considered abnormal. Correlate for signs/symptoms of lymphoproliferative disorder or left breast malignancy.   Electronically Signed   By: Julian Hy M.D.   On: 03/19/2015 18:08   Nm Myocar Single W/spect W/wall Motion And Ef  03/20/2015    This is a low risk study.  Defect 1: There is a defect present in the mid anteroseptal, apical  anterior and apical septal location.   Rest only imaging.  There is a moderate region of decreased radiotracer  uptake in the anteroseptal wall of the mid ventricle and apex which could  represent breast attenuation artifact or a region of prior infarct/scar.    Dg Chest Port 1 View  03/18/2015   CLINICAL DATA:  Dyspnea, onset tonight  EXAM: PORTABLE CHEST - 1 VIEW  COMPARISON:  03/01/2015  FINDINGS: There is  mild unchanged cardiomegaly. There are intact appearances of the transvenous leads.  There is new right base infiltrate and this may represent pneumonia. There is no large effusion. The left lung is clear.  IMPRESSION: Right base infiltrate.   Electronically Signed   By: Andreas Newport M.D.   On: 03/18/2015 04:09   Dg Chest Port 1 View  03/01/2015   CLINICAL DATA:  Sudden onset of fever today.  EXAM: PORTABLE CHEST - 1 VIEW  COMPARISON:  02/13/2009.  FINDINGS:  Cardiac silhouette mildly enlarged. No mediastinal hilar masses. Left anterior chest wall sequential pacemaker leads project over the right atrium right ventricle.  No lung consolidation or edema. No pleural effusion or pneumothorax.  Bony thorax is demineralized but grossly intact.  IMPRESSION: No acute cardiopulmonary disease.   Electronically Signed   By: Lajean Manes M.D.   On: 03/01/2015 21:20     CBC  Recent Labs Lab 03/20/15 0500 03/20/15 1325 03/20/15 2210 03/21/15 0510 03/22/15 0643  WBC 5.1 5.3 5.7 5.5 5.2  HGB 7.4* 7.8* 7.8* 7.8* 9.2*  HCT 24.4* 24.6* 25.3* 25.4* 30.4*  PLT 349 294 343 332 311  MCV 86.5 85.4 87.2 86.7 86.1  MCH 26.2 27.1 26.9 26.6 26.1  MCHC 30.3 31.7 30.8 30.7 30.3  RDW 17.2* 17.1* 17.2* 17.3* 17.2*    Chemistries   Recent Labs Lab 03/18/15 0305 03/20/15 0500 03/20/15 1325 03/21/15 0510 03/22/15 0643  NA 137 138  --  139 141  K 4.3 3.3*  --  3.4* 3.7  CL 103 98*  --  98* 100*  CO2 23 28  --  31 32  GLUCOSE 167* 69  --  81 82  BUN 10 10  --  11 10  CREATININE 0.94 1.26*  --  1.11* 1.02*  CALCIUM 9.4 8.7*  --  9.0 9.4  MG  --   --  1.3*  --   --   AST  --  12*  --  14* 15  ALT  --  9*  --  9* 10*  ALKPHOS  --  41  --  40 41  BILITOT  --  0.3  --  0.3 0.6   ------------------------------------------------------------------------------------------------------------------ estimated creatinine clearance is 46 mL/min (by C-G formula based on Cr of  1.02). ------------------------------------------------------------------------------------------------------------------ No results for input(s): HGBA1C in the last 72 hours. ------------------------------------------------------------------------------------------------------------------ No results for input(s): CHOL, HDL, LDLCALC, TRIG, CHOLHDL, LDLDIRECT in the last 72 hours. ------------------------------------------------------------------------------------------------------------------ No results for input(s): TSH, T4TOTAL, T3FREE, THYROIDAB in the last 72 hours.  Invalid input(s): FREET3 ------------------------------------------------------------------------------------------------------------------ No results for input(s): VITAMINB12, FOLATE, FERRITIN, TIBC, IRON, RETICCTPCT in the last 72 hours.  Coagulation profile  Recent Labs Lab 03/21/15 2247  INR 1.30    No results for input(s): DDIMER in the last 72 hours.  Cardiac Enzymes  Recent Labs Lab 03/18/15 1428 03/18/15 2008 03/19/15 0235  TROPONINI 0.04* 0.04* 0.03   ------------------------------------------------------------------------------------------------------------------ Invalid input(s): POCBNP   CBG:  Recent Labs Lab 03/22/15 1117  GLUCAP 75       Studies: No results found.    No results found for: HGBA1C Lab Results  Component Value Date   [facility]  02/11/2009    91        Total Cholesterol/HDL:CHD Risk Coronary Heart Disease Risk Table                     Men   Women  1/2 Average Risk   3.4   3.3  Average Risk       5.0   4.4  2 X Average Risk   9.6   7.1  3 X Average Risk  23.4   11.0        Use the calculated Patient Ratio above and the CHD Risk Table to determine the patient's CHD Risk.        ATP III CLASSIFICATION (LDL):  <100     mg/dL   Optimal  100-129  mg/dL   Near or Above  Optimal  130-159  mg/dL   Borderline  160-189  mg/dL   High  >190      mg/dL   Very High   CREATININE 1.02* 03/22/2015       Scheduled Meds: . antiseptic oral rinse  7 mL Mouth Rinse q12n4p  . [START ON 03/23/2015] aspirin EC  81 mg Oral Daily  . atorvastatin  20 mg Oral Daily  . carvedilol  12.5 mg Oral BID WC  . chlorhexidine  15 mL Mouth Rinse BID  . [START ON 03/23/2015] enoxaparin (LOVENOX) injection  40 mg Subcutaneous Q24H  . famotidine  20 mg Oral Daily  . feeding supplement (ENSURE ENLIVE)  237 mL Oral BID BM  . ferrous sulfate  325 mg Oral Q breakfast  . fluticasone  1 spray Each Nare Daily  . furosemide  40 mg Intravenous BID  . isosorbide mononitrate  30 mg Oral Daily  . levothyroxine  50 mcg Oral QAC breakfast  . lisinopril  5 mg Oral Daily  . mometasone-formoterol  2 puff Inhalation BID  . regadenoson  0.4 mg Intravenous Once  . sodium chloride  3 mL Intravenous Q12H  . sodium chloride  3 mL Intravenous Q12H   Continuous Infusions: . sodium chloride 1 mL/kg/hr (03/22/15 1136)    Principal Problem:   Acute CHF Active Problems:   Anemia   HTN (hypertension)   DM (dermatomyositis)   Colostomy in place   COPD (chronic obstructive pulmonary disease)   Pulmonary infiltrate on chest x-ray   Acute respiratory failure, unspecified whether with hypoxia or hypercapnia    Time spent: 45 minutes   [facility] Pager [phone]. If 7PM-7AM, please contact night-coverage at [email or web address], password [email or web address] 03/22/2015, 12:18 PM  LOS: 4 days

## 2015-03-22 NOTE — Progress Notes (Signed)
Pt voided additional 26ml clear amber urine.  Linnen changed..  Held groin additional 5 minutes to relieve Debbie for lunch.  Dsg applied to site and distal DP palpatable.  Pt education given r/t groin and voiced back understanding of same.  Pt transferred to 3w16

## 2015-03-22 NOTE — Interval H&P Note (Signed)
History and Physical Interval Note:  03/22/2015 9:49 AM  [name]  has presented today for surgery, with the diagnosis of unstable angina - reduced LVEF/Cardiomyopathy with Acute Combined Systolic / Diastolic HF  The various methods of treatment have been discussed with the patient and family. After consideration of risks, benefits and other options for treatment, the patient has consented to  Procedure(s): Right/Left Heart Cath and Coronary Angiography (N/A) (with possible PCI) as a surgical intervention .  The patient's history has been reviewed, patient examined, no change in status, stable for surgery.  I have reviewed the patient's chart and labs.  Questions were answered to the patient's satisfaction.     [place], Nectar   Cath Lab Visit (complete for each Cath Lab visit)  Clinical Evaluation Leading to the Procedure:   ACS: Yes.    Non-ACS:    Anginal Classification: CCS IV  - acute dyspnea  Anti-ischemic medical therapy: Maximal Therapy (2 or more classes of medications)  Non-Invasive Test Results: Intermediate-risk stress test findings: cardiac mortality 1-3%/year - only rest images done  Prior CABG: No previous CABG   AUC FOR DIAGNOSTIC R&L HEART CATH  Cardiomyopathies (Right and Left Heart Catheterization OR  Right Heart Catheterization Alone With/Without Left Ventriculography and Coronary Angiography)   Patient Information:    Known or suspected cardiomyopathy with or without heart failure  AUC Score:   A (7)   Indication:   93   HARDING, DAVID W, M.D., M.S. Interventional Cardiologist   Pager # [phone]

## 2015-03-23 ENCOUNTER — Other Ambulatory Visit: Payer: Self-pay | Admitting: Physician Assistant

## 2015-03-23 DIAGNOSIS — I5041 Acute combined systolic (congestive) and diastolic (congestive) heart failure: Secondary | ICD-10-CM

## 2015-03-23 DIAGNOSIS — I5021 Acute systolic (congestive) heart failure: Secondary | ICD-10-CM

## 2015-03-23 DIAGNOSIS — I429 Cardiomyopathy, unspecified: Secondary | ICD-10-CM

## 2015-03-23 LAB — COMPREHENSIVE METABOLIC PANEL
ALT: 11 U/L — AB (ref 14–54)
AST: 16 U/L (ref 15–41)
Albumin: 2.9 g/dL — ABNORMAL LOW (ref 3.5–5.0)
Alkaline Phosphatase: 41 U/L (ref 38–126)
Anion gap: 8 (ref 5–15)
BUN: 11 mg/dL (ref 6–20)
CO2: 33 mmol/L — AB (ref 22–32)
Calcium: 9.7 mg/dL (ref 8.9–10.3)
Chloride: 100 mmol/L — ABNORMAL LOW (ref 101–111)
Creatinine, Ser: 1.15 mg/dL — ABNORMAL HIGH (ref 0.44–1.00)
GFR calc Af Amer: 51 mL/min — ABNORMAL LOW (ref >60)
GFR, EST NON AFRICAN AMERICAN: 44 mL/min — AB (ref >60)
Glucose, Bld: 87 mg/dL (ref 65–99)
Potassium: 3.6 mmol/L (ref 3.5–5.1)
SODIUM: 141 mmol/L (ref 135–145)
TOTAL PROTEIN: 7.1 g/dL (ref 6.5–8.1)
Total Bilirubin: 0.5 mg/dL (ref 0.3–1.2)

## 2015-03-23 LAB — CBC
HEMATOCRIT: 31.3 % — AB (ref 36.0–46.0)
HEMOGLOBIN: 9.7 g/dL — AB (ref 12.0–15.0)
MCH: 26.6 pg (ref 26.0–34.0)
MCHC: 31 g/dL (ref 30.0–36.0)
MCV: 85.8 fL (ref 78.0–100.0)
Platelets: 312 10*3/uL (ref 150–400)
RBC: 3.65 MIL/uL — ABNORMAL LOW (ref 3.87–5.11)
RDW: 17.1 % — ABNORMAL HIGH (ref 11.5–15.5)
WBC: 6.3 10*3/uL (ref 4.0–10.5)

## 2015-03-23 MED FILL — Heparin Sodium (Porcine) 2 Unit/ML in Sodium Chloride 0.9%: INTRAMUSCULAR | Qty: 1000 | Status: AC

## 2015-03-23 NOTE — Progress Notes (Signed)
Heart Failure Navigator Consult Note  Presentation: [name] is a 81 y.o. female with Past medical history of hypertension, asthma, possible diastolic dysfunction, COPD, hypothyroidism, anemia, colostomy, chronic kidney disease. The patient presents with complaints of sudden onset of shortness of breath. She mentions that she was sleeping and suddenly woke up from her sleep as it felt hot. She denies any chest pain denies any cough denies any choking episode denies any fever denies any chills denies any diarrhea or constipation or abdominal pain or any focal deficit. She denies any changes in her medication. Last dose of Lasix was on 03/15/2015. She denies any active bleeding. She denies any similar symptoms in the past.   Past Medical History  Diagnosis Date  . Hypertension   . Asthma   . CHF (congestive heart failure)   . COPD (chronic obstructive pulmonary disease)   . Colon cancer   . Hyperlipidemia   . Heart murmur   . Anginal pain   . Pulmonary embolism   . Pneumonia     "just once" (03/01/2015)  . Hypothyroidism   . Diabetes mellitus without complication     pt denies this hx on 03/01/2015  . History of blood transfusion     "related to anemia, this is my 1st" (03/01/2015)  . Arthritis     "comes and goes" (03/01/2015)  . Renal disorder   . Colostomy care     History   Social History  . Marital Status: Divorced    Spouse Name: N/A  . Number of Children: N/A  . Years of Education: N/A   Occupational History  . Retired Psychologist, counselling    Social History Main Topics  . Smoking status: Never Smoker   . Smokeless tobacco: Never Used  . Alcohol Use: No  . Drug Use: No  . Sexual Activity: No   Other Topics Concern  . None   Social History Narrative    ECHO:Study Conclusions--03/19/15  - Left ventricle: Technically limited study. Hypokinesis of the inferior wall, and the septum and the anterior wall. The cavity size was normal. Wall thickness was  increased in a pattern of moderate LVH. Systolic function was moderately reduced. The estimated ejection fraction was in the range of 35% to 40%. - Aortic valve: Sclerosis without stenosis. There was trivial regurgitation. - Mitral valve: Moderately calcified annulus. There was no evidence for stenosis. - Right ventricle: The cavity size was mildly dilated. Pacer wire or catheter noted in right ventricle. Systolic function was normal. - Right atrium: The atrium was mildly dilated. - Pericardium, extracardiac: There is small/moderate posterior pericardial effusion.  Transthoracic echocardiography. M-mode, complete 2D, spectral Doppler, and color Doppler. Birthdate: Patient birthdate: Aug 11, 1934. Age: Patient is 81 yr old. Sex: Gender: female. BMI: 33.8 kg/m^2. Blood pressure:   145/64 Patient status: Inpatient. Study date: Study date: 03/19/2015. Study time: 01:33 PM. Location: Bedside.  BNP    Component Value Date/Time   BNP 1270.3* 03/18/2015 0305    ProBNP    Component Value Date/Time   PROBNP <30.0 02/11/2009 2235     Education Assessment and Provision:  Detailed education and instructions provided on heart failure disease management including the following:  Signs and symptoms of Heart Failure When to call the physician Importance of daily weights Low sodium diet Fluid restriction Medication management Anticipated future follow-up appointments  Patient education given on each of the above topics.  Patient acknowledges understanding and acceptance of all instructions.  I spoke briefly with [name] regarding her  HF.  She will discharge to a SNF however ultimately would like to return to her apt. beside her son.  I reinforced the importance of daily weights and how they relate to signs and symptoms of HF.  I also explained a low sodium diet and high sodium foods to avoid.  She will follow-up with [facility].  Education  Materials:  "Living Better With Heart Failure" Booklet, Daily Weight Tracker Tool   High Risk Criteria for Readmission and/or Poor Patient Outcomes:   EF <30%- No 35-40%  2 or more admissions in 6 months- No  Difficult social situation- No  Demonstrates medication noncompliance- No    Barriers of Care:  Ability to care for her self, knowledge and compliance  Discharge Planning:   Plans to return to SNF after discharge--has an apt beside her son.

## 2015-03-23 NOTE — Evaluation (Signed)
Physical Therapy Evaluation Patient Details Name: [name] MRN: [id number] DOB: 02-25-1934 Today's Date: 03/23/2015   History of Present Illness  Adm for acute CHF; developed anemia and received one unit of blood; Rt and Lt heart cath 6/13 with EF 35% PMHx- CAD, CHF,dermatomyositis, HTN, COPD, colostomy  Clinical Impression  Pt admitted with above diagnosis. Pt currently with functional limitations due to the deficits listed below (see PT Problem List). Pt reports progress with PT at SNF and plans to return to continue her rehab. Pt will benefit from skilled PT at SNF to increase their independence and safety with mobility.     Follow Up Recommendations SNF    Equipment Recommendations  None recommended by PT    Recommendations for Other Services       Precautions / Restrictions Precautions Precautions: Fall      Mobility  Bed Mobility               General bed mobility comments: up in chair on arrival  Transfers Overall transfer level: Needs assistance Equipment used: Rolling walker (2 wheeled) Transfers: Sit to/from [facility] Sit to Stand: Mod assist Stand pivot transfers: Mod assist       General transfer comment: stood from regular height recliner with good use of bil UEs (required assist to transfer forward over her feet); same from [facility]  Ambulation/Gait             General Gait Details: pivotal steps only due to incontinence on first attempt and fatigue after use of BSC  Stairs            Wheelchair Mobility    Modified Rankin (Stroke Patients Only)       Balance Overall balance assessment: Needs assistance         Standing balance support: Bilateral upper extremity supported Standing balance-Leahy Scale: Poor                               Pertinent Vitals/Pain Pain Assessment: No/denies pain    Home Living Family/patient expects to be discharged to:: Skilled nursing facility                  Additional Comments: return to SNF for more rehab    Prior Function Level of Independence: Needs assistance   Gait / Transfers Assistance Needed: States she ambulates with therapy           Hand Dominance   Dominant Hand: Right    Extremity/Trunk Assessment   Upper Extremity Assessment: Generalized weakness           Lower Extremity Assessment: Generalized weakness      Cervical / Trunk Assessment: Kyphotic  Communication   Communication: No difficulties  Cognition Arousal/Alertness: Awake/alert Behavior During Therapy: Impulsive (needing to use BSC due to lasix) Overall Cognitive Status: No family/caregiver present to determine baseline cognitive functioning                      General Comments      Exercises General Exercises - Lower Extremity Ankle Circles/Pumps: AROM;Both;10 reps (further exercise deferred due to need [facility])      Assessment/Plan    PT Assessment All further PT needs can be met in the next venue of care  PT Diagnosis Generalized weakness;Difficulty walking   PT Problem List Decreased strength;Decreased activity tolerance;Decreased balance;Decreased range of motion;Decreased mobility;Decreased knowledge of use of DME;Decreased safety awareness;Decreased  knowledge of precautions;Obesity  PT Treatment Interventions     PT Goals (Current goals can be found in the Care Plan section) Acute Rehab PT Goals Patient Stated Goal: Go back to rehab PT Goal Formulation: All assessment and education complete, DC therapy    Frequency     Barriers to discharge        Co-evaluation               End of Session Equipment Utilized During Treatment: Gait belt Activity Tolerance: Patient limited by fatigue Patient left: in chair;with call bell/phone within reach           Time: 7035-0093 PT Time Calculation (min) (ACUTE ONLY): 20 min   Charges:   PT Evaluation $Initial PT Evaluation Tier I: 1 Procedure     PT G  Codes:        [name] 01-Apr-2015, 11:57 AM Pager [phone]

## 2015-03-23 NOTE — Plan of Care (Signed)
Problem: Phase I Progression Outcomes Goal: Up in chair, BRP Outcome: Completed/Met Date Met:  03/23/15 Able to transfer to chair at bedside with max assist X2

## 2015-03-23 NOTE — Progress Notes (Signed)
Patient Name: [name] Date of Encounter: 03/23/2015  Primary Cardiologist: new - Dr. Meda Coffee   Principal Problem:   Acute CHF Active Problems:   Anemia   HTN (hypertension)   DM (dermatomyositis)   Colostomy in place   COPD (chronic obstructive pulmonary disease)   Pulmonary infiltrate on chest x-ray   Acute respiratory failure, unspecified whether with hypoxia or hypercapnia    SUBJECTIVE  Denies any CP or SOB.   CURRENT MEDS . antiseptic oral rinse  7 mL Mouth Rinse q12n4p  . aspirin EC  81 mg Oral Daily  . atorvastatin  20 mg Oral Daily  . carvedilol  12.5 mg Oral BID WC  . chlorhexidine  15 mL Mouth Rinse BID  . enoxaparin (LOVENOX) injection  40 mg Subcutaneous Q24H  . famotidine  20 mg Oral Daily  . feeding supplement (ENSURE ENLIVE)  237 mL Oral BID BM  . ferrous sulfate  325 mg Oral Q breakfast  . fluticasone  1 spray Each Nare Daily  . furosemide  40 mg Intravenous BID  . isosorbide mononitrate  30 mg Oral Daily  . levothyroxine  50 mcg Oral QAC breakfast  . lisinopril  5 mg Oral Daily  . mometasone-formoterol  2 puff Inhalation BID  . pantoprazole  40 mg Oral BID  . regadenoson  0.4 mg Intravenous Once  . sodium chloride  3 mL Intravenous Q12H    OBJECTIVE  Filed Vitals:   03/22/15 1350 03/22/15 1648 03/22/15 2019 03/23/15 0500  BP: 159/67  128/56 151/66  Pulse:   76 80  Temp:  98.8 F (37.1 C) 99.1 F (37.3 C) 99.1 F (37.3 C)  TempSrc:  Oral Oral Oral  Resp:      Height:    5\' 3"  (1.6 m)  Weight:    183 lb 4.8 oz (83.144 kg)  SpO2:   100% 100%   No intake or output data in the 24 hours ending 03/23/15 0740 Filed Weights   03/21/15 0500 03/22/15 0400 03/23/15 0500  Weight: 187 lb 3.2 oz (84.913 kg) 179 lb 12.8 oz (81.557 kg) 183 lb 4.8 oz (83.144 kg)    PHYSICAL EXAM  General: Pleasant, NAD. Neuro: Alert and oriented X 3. Moves all extremities spontaneously. Psych: Normal affect. HEENT:  Normal  Neck: Supple without bruits or  JVD. Lungs:  Resp regular and unlabored. Bibasilar rale. Heart: RRR no s3, s4, or murmurs. Abdomen: Soft, non-tender, non-distended, BS + x 4.  Extremities: No clubbing, cyanosis or edema. DP/PT/Radials 2+ and equal bilaterally.  Accessory Clinical Findings  CBC  Recent Labs  03/22/15 0643 03/23/15 0510  WBC 5.2 6.3  HGB 9.2* 9.7*  HCT 30.4* 31.3*  MCV 86.1 85.8  PLT 311 403   Basic Metabolic Panel  Recent Labs  03/20/15 1325  03/22/15 0643 03/23/15 0510  NA  --   < > 141 141  K  --   < > 3.7 3.6  CL  --   < > 100* 100*  CO2  --   < > 32 33*  GLUCOSE  --   < > 82 87  BUN  --   < > 10 11  CREATININE  --   < > 1.02* 1.15*  CALCIUM  --   < > 9.4 9.7  MG 1.3*  --   --   --   < > = values in this interval not displayed. Liver Function Tests  Recent Labs  03/22/15 [id number] 03/23/15 0510  AST 15 16  ALT 10* 11*  ALKPHOS 41 41  BILITOT 0.6 0.5  PROT 6.8 7.1  ALBUMIN 2.8* 2.9*    TELE Paced rhythm with HR 70s    ECG  No new EKG  Echocardiogram 03/19/2015  - Left ventricle: Technically limited study. Hypokinesis of the inferior wall, and the septum and the anterior wall. The cavity size was normal. Wall thickness was increased in a pattern of moderate LVH. Systolic function was moderately reduced. The estimated ejection fraction was in the range of 35% to 40%. - Aortic valve: Sclerosis without stenosis. There was trivial regurgitation. - Mitral valve: Moderately calcified annulus. There was no evidence for stenosis. - Right ventricle: The cavity size was mildly dilated. Pacer wire or catheter noted in right ventricle. Systolic function was normal. - Right atrium: The atrium was mildly dilated. - Pericardium, extracardiac: There is small/moderate posterior pericardial effusion.    Radiology/Studies  Dg Chest 2 View  03/18/2015   CLINICAL DATA:  Followup right lower lobe pneumonia. Congestive heart failure. COPD.  EXAM: CHEST  2 VIEW   COMPARISON:  Prior today on 03/18/2015  FINDINGS: Cardiomegaly is stable. Dual lead transvenous pacemaker remains in appropriate position.  There is mild improvement in airspace opacity in the right lower lobe since previous study. Diffuse pulmonary vascular congestion is demonstrated, without evidence of frank pulmonary edema.  IMPRESSION: Mild improvement in airspace opacity in right lower lobe since prior exam.  Stable cardiomegaly and diffuse pulmonary vascular congestion.   Electronically Signed   By: Earle Gell M.D.   On: 03/18/2015 17:43   Ct Angio Chest Pe W/cm &/or Wo Cm  03/19/2015   CLINICAL DATA:  Shortness of breath x several days, pulmonary infiltrate on chest radiograph  EXAM: CT ANGIOGRAPHY CHEST WITH CONTRAST  TECHNIQUE: Multidetector CT imaging of the chest was performed using the standard protocol during bolus administration of intravenous contrast. Multiplanar CT image reconstructions and MIPs were obtained to evaluate the vascular anatomy.  CONTRAST:  33mL OMNIPAQUE IOHEXOL 350 MG/ML SOLN  COMPARISON:  Chest radiograph dated 03/18/2015  FINDINGS: No evidence of pulmonary embolism.  Mediastinum/Nodes: Cardiomegaly.  Small pericardial effusion.  Left subclavian ICD.  Atherosclerotic calcifications of the aortic arch.  Small mediastinal lymph nodes, favored to be reactive, including:  --9 mm short axis prevascular node (series 5/ image 33)  --10 mm short axis subcarinal node (series 5/ image 43)  --13 mm short axis left hilar node (series 5/image 47)  Enlarged left axillary nodes measuring up to 15 mm short axis (series 5/ image 26), abnormal, although poorly evaluated due to streak artifact from the patient's pacemaker.  Small right axillary nodes.  Small left supraclavicular nodes measuring up to 8 mm short axis (series 5/ image 11), within the upper limits of normal.  Lungs/Pleura: Mild patchy opacities in the lingula and bilateral lower lobes, likely atelectasis.  No focal consolidation.  8  mm pleural-based nodule overlying the right middle lobe (series 12/image 54), unchanged since 2010.  No suspicious pulmonary nodules.  No frank interstitial edema.  No pleural effusion or pneumothorax.  Upper abdomen: Visualized upper abdomen is unremarkable.  Musculoskeletal: Degenerative changes of the visualized thoracolumbar spine.  Review of the MIP images confirms the above findings.  IMPRESSION: No evidence of pulmonary embolism.  No frank interstitial edema.  Small supraclavicular and thoracic lymph nodes, most of which can be considered reactive, although left axillary nodes are enlarged up to 15 mm which is considered abnormal. Correlate for signs/symptoms  of lymphoproliferative disorder or left breast malignancy.   Electronically Signed   By: Julian Hy M.D.   On: 03/19/2015 18:08   Nm Myocar Single W/spect W/wall Motion And Ef  03/20/2015    This is a low risk study.  Defect 1: There is a defect present in the mid anteroseptal, apical  anterior and apical septal location.   Rest only imaging.  There is a moderate region of decreased radiotracer  uptake in the anteroseptal wall of the mid ventricle and apex which could  represent breast attenuation artifact or a region of prior infarct/scar.    Dg Chest Port 1 View  03/18/2015   CLINICAL DATA:  Dyspnea, onset tonight  EXAM: PORTABLE CHEST - 1 VIEW  COMPARISON:  03/01/2015  FINDINGS: There is mild unchanged cardiomegaly. There are intact appearances of the transvenous leads.  There is new right base infiltrate and this may represent pneumonia. There is no large effusion. The left lung is clear.  IMPRESSION: Right base infiltrate.   Electronically Signed   By: Andreas Newport M.D.   On: 03/18/2015 04:09   Dg Chest Port 1 View  03/01/2015   CLINICAL DATA:  Sudden onset of fever today.  EXAM: PORTABLE CHEST - 1 VIEW  COMPARISON:  02/13/2009.  FINDINGS: Cardiac silhouette mildly enlarged. No mediastinal hilar masses. Left anterior chest wall  sequential pacemaker leads project over the right atrium right ventricle.  No lung consolidation or edema. No pleural effusion or pneumothorax.  Bony thorax is demineralized but grossly intact.  IMPRESSION: No acute cardiopulmonary disease.   Electronically Signed   By: Lajean Manes M.D.   On: 03/01/2015 21:20    ASSESSMENT AND PLAN  81 y.o. female with Past medical history of hypertension, asthma, ? Prior CHF managed by her PCP, COPD, hypothyroidism, anemia, colostomy, chronic kidney disease. She presented with complaints of sudden onset of shortness of breath associated with diaphoresis, no chest pain. Did resting portion of myoview on 6/9 before d-dimer came positive at 4.6. Stress portion cancelled and CTA ordered 6/10, negative for PE. L&RHC 6/13 minimal CAD, CI/CO stable.   1. Acute systolic CHF - echo 01/15/8118 EF 35-40%, hypokinesis of inferior wall, septal wall, small/moderate pericardial effusion - continue coreg, imdur, and lisinopril. ASA and statin - still some bibasilar rale on exam, however RHC showed normal wedge pressure of 11-72mmHg with elevated LVEDP of 23-16mmHg. Patient is not SOB, will continue IV lasix this morning, and transition to PO lasix 40mg  daily in PM. May be able to be discharged either this afternoon or tomorrow. Will need BMET in 1 week. Will arrange followup with Dr. Meda Coffee  2. Anemia Hgb dropped from 9.9 to 8.1. Needs further w/u by primary service   3. HTN  Labile. Continue to monitor. Coreg 12.5 BID, lisinopril 5QD  4. Colostomy in place 5. COPD (chronic obstructive pulmonary disease) 6. HLD Lipitor.  7. AICD: (Dr. Dalbert Batman in [facility] in 2005) [employer]tronic normal function   8. dermatomyositis  Signed, Almyra Deforest PA-C Pager: [phone]  Signed, Almyra Deforest PA-C Pager: [phone]   Agree with note by Almyra Deforest PA-C  Results of cath noted. Nl Cors. Moderately severe LV dysfunction.  NISCM. Med Rx with BB, Ace-i , nitrates and diuretics. Agree with transition from IV t0 PO diuretics. Prob home tomorrow with close OP f/u   Lorretta Harp, M.D., [place], [facility], [place], [place] [address] [place], [place]  [id number]  [phone] 03/23/2015  10:27 AM

## 2015-03-23 NOTE — Progress Notes (Signed)
[facility] PROGRESS NOTE  [name] JGO:[id number] DOB: [birth year] DOA: 03/18/2015 PCP: No primary care provider on file.  Assessment/Plan: Principal Problem:   Acute CHF Active Problems:   Anemia   HTN (hypertension)   DM (dermatomyositis)   Colostomy in place   COPD (chronic obstructive pulmonary disease)   Pulmonary infiltrate on chest x-ray   Acute respiratory failure, unspecified whether with hypoxia or hypercapnia       Acute systolic CHF -   - elevated BNP, LE edema and crackles on physical exam improving Continue Lasix 40 mg IV twice a day Echocardiogram shows an EF of 35-40%, management per cardiology right and left heart cath shows increased left ventricular end-diastolic pressure, CVP of 15, continue diuresis, no significant coronary artery disease   Abnormal d-dimer CT angiogram of the chest negative for PE Venous Doppler negative Heparin drip discontinued   Anemia of chronic disease, versus acute blood loss anemia  drop from 9.9-7.4, given abnormal troponin and possible underlying coronary artery disease, status post 1 unit of packed red blood cells today, since then hemoglobin has been stable, 9.7 today Drop in hemoglobin was in the setting of heparin drip which was discontinued 6/10, no evidence of active bleeding Continue heparin subcutaneous for DVT prophylaxis Stool occult negative 1,  Continue PPI , no indication for GI workup at this time, but may need age-appropriate screening in the outpatient setting   Abnormal troponin - nondiagnostic per cardiology, awaiting results of the nuclear study   negative  CT angiogram - baseline LBBB present a months ago, no prior ECGs - most probably secondary to CHF - continue ASA, atorvastatin, completed right and left heart cath  On 6/13 Pacemaker interrogation by cardiology, AICD: (Dr. Dalbert Batman in [place] in 2005) medtronic normal function    Hypertension  Improving, continue Coreg , Lasix and  lisinopril   Hyperlipidemia - continue atorvastatin   questionable pneumonia, chest x-ray does not show any evidence of pneumonia, most likely atelectasis, stopped antibiotics   Code Status: full Family Communication: family updated about patient's clinical progress Disposition Plan: Anticipate discharge tomorrow to SNF, DC PICC line prior to discharge   Brief narrative: 81 y.o. female with Past medical history of hypertension, asthma, ? Prior CHF managed by her PCP, COPD, hypothyroidism, anemia, colostomy, chronic kidney disease. There is FH of CHF in her grandmother. Patient presented with sudden onset of shortness of breath and diaphoresis She underwent extensive workup including CT angiography chest to rule out pulmonary embolism after PICC line placement due to poor IV access, cardiac catheterization, cardiac catheterization showed increased LVEDP, CVP cardiology recommends continue diuresis, anticipate discharge tomorrow   Consultants:  Cardiology  Procedures:  None  Antibiotics: Anti-infectives    Start     Dose/Rate Route Frequency Ordered Stop   03/19/15 0200  vancomycin (VANCOCIN) IVPB 750 mg/150 ml premix  Status:  Discontinued     750 mg 150 mL/hr over 60 Minutes Intravenous Every 12 hours 03/18/15 1318 03/20/15 1116   03/18/15 1330  vancomycin (VANCOCIN) 1,500 mg in sodium chloride 0.9 % 500 mL IVPB     1,500 mg 250 mL/hr over 120 Minutes Intravenous  Once 03/18/15 1319 03/18/15 1622   03/18/15 1000  piperacillin-tazobactam (ZOSYN) IVPB 3.375 g  Status:  Discontinued     3.375 g 12.5 mL/hr over 240 Minutes Intravenous Every 8 hours 03/18/15 0915 03/20/15 1116   03/18/15 1000  vancomycin (VANCOCIN) 1,500 mg in sodium chloride 0.9 %  500 mL IVPB  Status:  Discontinued     1,500 mg 250 mL/hr over 120 Minutes Intravenous  Once 03/18/15 0915 03/18/15 1319         HPI/Subjective: Denies any active chest pain, shortness of breath, no evidence of melena  or  hematochezia  Objective: Filed Vitals:   03/22/15 1648 03/22/15 2019 03/23/15 0500 03/23/15 0922  BP:  128/56 151/66   Pulse:  76 80   Temp: 98.8 F (37.1 C) 99.1 F (37.3 C) 99.1 F (37.3 C)   TempSrc: Oral Oral Oral   Resp:      Height:   5\' 3"  (1.6 m)   Weight:   83.144 kg (183 lb 4.8 oz)   SpO2:  100% 100% 100%    Intake/Output Summary (Last 24 hours) at 03/23/15 1058 Last data filed at 03/23/15 0843  Gross per 24 hour  Intake     20 ml  Output    250 ml  Net   -230 ml    Exam: General appearance: alert, cooperative and no distress Neck: No JVD Lungs: Basilar crackles. No wheeze Heart: regular rate and rhythm Abdomen: +BS, nontender Extremities: No pitting edema Pulses: 2+ and symmetric Skin: Warm and dry Neurologic: Grossly normal     Data Review   Micro Results Recent Results (from the past 240 hour(s))  MRSA PCR Screening     Status: None   Collection Time: 03/18/15  6:06 AM  Result Value Ref Range Status   MRSA by PCR NEGATIVE NEGATIVE Final    Comment:        The GeneXpert MRSA Assay (FDA approved for NASAL specimens only), is one component of a comprehensive MRSA colonization surveillance program. It is not intended to diagnose MRSA infection nor to guide or monitor treatment for MRSA infections.   Culture, blood (routine x 2)     Status: None (Preliminary result)   Collection Time: 03/18/15  7:05 AM  Result Value Ref Range Status   Specimen Description BLOOD RIGHT ANTECUBITAL  Final   Special Requests BOTTLES DRAWN AEROBIC ONLY 10CC  Final   Culture   Final           BLOOD CULTURE RECEIVED NO GROWTH TO DATE CULTURE WILL BE HELD FOR 5 DAYS BEFORE ISSUING A FINAL NEGATIVE REPORT Performed at [facility]    Report Status PENDING  Incomplete  Culture, blood (routine x 2)     Status: None (Preliminary result)   Collection Time: 03/18/15  7:20 AM  Result Value Ref Range Status   Specimen Description BLOOD LEFT ANTECUBITAL  Final    Special Requests BOTTLES DRAWN AEROBIC ONLY 10CC  Final   Culture   Final           BLOOD CULTURE RECEIVED NO GROWTH TO DATE CULTURE WILL BE HELD FOR 5 DAYS BEFORE ISSUING A FINAL NEGATIVE REPORT Performed at [facility]    Report Status PENDING  Incomplete    Radiology Reports Dg Chest 2 View  03/18/2015   CLINICAL DATA:  Followup right lower lobe pneumonia. Congestive heart failure. COPD.  EXAM: CHEST  2 VIEW  COMPARISON:  Prior today on 03/18/2015  FINDINGS: Cardiomegaly is stable. Dual lead transvenous pacemaker remains in appropriate position.  There is mild improvement in airspace opacity in the right lower lobe since previous study. Diffuse pulmonary vascular congestion is demonstrated, without evidence of frank pulmonary edema.  IMPRESSION: Mild improvement in airspace opacity in right lower lobe since prior exam.  Stable cardiomegaly and diffuse pulmonary vascular congestion.   Electronically Signed   By: Earle Gell M.D.   On: 03/18/2015 17:43   Ct Angio Chest Pe W/cm &/or Wo Cm  03/19/2015   CLINICAL DATA:  Shortness of breath x several days, pulmonary infiltrate on chest radiograph  EXAM: CT ANGIOGRAPHY CHEST WITH CONTRAST  TECHNIQUE: Multidetector CT imaging of the chest was performed using the standard protocol during bolus administration of intravenous contrast. Multiplanar CT image reconstructions and MIPs were obtained to evaluate the vascular anatomy.  CONTRAST:  35mL OMNIPAQUE IOHEXOL 350 MG/ML SOLN  COMPARISON:  Chest radiograph dated 03/18/2015  FINDINGS: No evidence of pulmonary embolism.  Mediastinum/Nodes: Cardiomegaly.  Small pericardial effusion.  Left subclavian ICD.  Atherosclerotic calcifications of the aortic arch.  Small mediastinal lymph nodes, favored to be reactive, including:  --9 mm short axis prevascular node (series 5/ image 33)  --10 mm short axis subcarinal node (series 5/ image 43)  --13 mm short axis left hilar node (series 5/image 47)  Enlarged left  axillary nodes measuring up to 15 mm short axis (series 5/ image 26), abnormal, although poorly evaluated due to streak artifact from the patient's pacemaker.  Small right axillary nodes.  Small left supraclavicular nodes measuring up to 8 mm short axis (series 5/ image 11), within the upper limits of normal.  Lungs/Pleura: Mild patchy opacities in the lingula and bilateral lower lobes, likely atelectasis.  No focal consolidation.  8 mm pleural-based nodule overlying the right middle lobe (series 12/image 54), unchanged since 2010.  No suspicious pulmonary nodules.  No frank interstitial edema.  No pleural effusion or pneumothorax.  Upper abdomen: Visualized upper abdomen is unremarkable.  Musculoskeletal: Degenerative changes of the visualized thoracolumbar spine.  Review of the MIP images confirms the above findings.  IMPRESSION: No evidence of pulmonary embolism.  No frank interstitial edema.  Small supraclavicular and thoracic lymph nodes, most of which can be considered reactive, although left axillary nodes are enlarged up to 15 mm which is considered abnormal. Correlate for signs/symptoms of lymphoproliferative disorder or left breast malignancy.   Electronically Signed   By: Julian Hy M.D.   On: 03/19/2015 18:08   Nm Myocar Single W/spect W/wall Motion And Ef  03/20/2015    This is a low risk study.  Defect 1: There is a defect present in the mid anteroseptal, apical  anterior and apical septal location.   Rest only imaging.  There is a moderate region of decreased radiotracer  uptake in the anteroseptal wall of the mid ventricle and apex which could  represent breast attenuation artifact or a region of prior infarct/scar.    Dg Chest Port 1 View  03/18/2015   CLINICAL DATA:  Dyspnea, onset tonight  EXAM: PORTABLE CHEST - 1 VIEW  COMPARISON:  03/01/2015  FINDINGS: There is mild unchanged cardiomegaly. There are intact appearances of the transvenous leads.  There is new right base infiltrate  and this may represent pneumonia. There is no large effusion. The left lung is clear.  IMPRESSION: Right base infiltrate.   Electronically Signed   By: Andreas Newport M.D.   On: 03/18/2015 04:09   Dg Chest Port 1 View  03/01/2015   CLINICAL DATA:  Sudden onset of fever today.  EXAM: PORTABLE CHEST - 1 VIEW  COMPARISON:  02/13/2009.  FINDINGS: Cardiac silhouette mildly enlarged. No mediastinal hilar masses. Left anterior chest wall sequential pacemaker leads project over the right atrium right ventricle.  No lung consolidation or  edema. No pleural effusion or pneumothorax.  Bony thorax is demineralized but grossly intact.  IMPRESSION: No acute cardiopulmonary disease.   Electronically Signed   By: Lajean Manes M.D.   On: 03/01/2015 21:20     CBC  Recent Labs Lab 03/20/15 1325 03/20/15 2210 03/21/15 0510 03/22/15 0643 03/23/15 0510  WBC 5.3 5.7 5.5 5.2 6.3  HGB 7.8* 7.8* 7.8* 9.2* 9.7*  HCT 24.6* 25.3* 25.4* 30.4* 31.3*  PLT 294 343 332 311 312  MCV 85.4 87.2 86.7 86.1 85.8  MCH 27.1 26.9 26.6 26.1 26.6  MCHC 31.7 30.8 30.7 30.3 31.0  RDW 17.1* 17.2* 17.3* 17.2* 17.1*    Chemistries   Recent Labs Lab 03/18/15 0305 03/20/15 0500 03/20/15 1325 03/21/15 0510 03/22/15 0643 03/23/15 0510  NA 137 138  --  139 141 141  K 4.3 3.3*  --  3.4* 3.7 3.6  CL 103 98*  --  98* 100* 100*  CO2 23 28  --  31 32 33*  GLUCOSE 167* 69  --  81 82 87  BUN 10 10  --  11 10 11   CREATININE 0.94 1.26*  --  1.11* 1.02* 1.15*  CALCIUM 9.4 8.7*  --  9.0 9.4 9.7  MG  --   --  1.3*  --   --   --   AST  --  12*  --  14* 15 16  ALT  --  9*  --  9* 10* 11*  ALKPHOS  --  41  --  40 41 41  BILITOT  --  0.3  --  0.3 0.6 0.5   ------------------------------------------------------------------------------------------------------------------ estimated creatinine clearance is 41.2 mL/min (by C-G formula based on Cr of  1.15). ------------------------------------------------------------------------------------------------------------------ No results for input(s): HGBA1C in the last 72 hours. ------------------------------------------------------------------------------------------------------------------ No results for input(s): CHOL, HDL, LDLCALC, TRIG, CHOLHDL, LDLDIRECT in the last 72 hours. ------------------------------------------------------------------------------------------------------------------ No results for input(s): TSH, T4TOTAL, T3FREE, THYROIDAB in the last 72 hours.  Invalid input(s): FREET3 ------------------------------------------------------------------------------------------------------------------ No results for input(s): VITAMINB12, FOLATE, FERRITIN, TIBC, IRON, RETICCTPCT in the last 72 hours.  Coagulation profile  Recent Labs Lab 03/21/15 2247  INR 1.30    No results for input(s): DDIMER in the last 72 hours.  Cardiac Enzymes  Recent Labs Lab 03/18/15 1428 03/18/15 2008 03/19/15 0235  TROPONINI 0.04* 0.04* 0.03   ------------------------------------------------------------------------------------------------------------------ Invalid input(s): POCBNP   CBG:  Recent Labs Lab 03/22/15 1117  GLUCAP 75       Studies: No results found.    No results found for: HGBA1C Lab Results  Component Value Date   [facility]  02/11/2009    91        Total Cholesterol/HDL:CHD Risk Coronary Heart Disease Risk Table                     Men   Women  1/2 Average Risk   3.4   3.3  Average Risk       5.0   4.4  2 X Average Risk   9.6   7.1  3 X Average Risk  23.4   11.0        Use the calculated Patient Ratio above and the CHD Risk Table to determine the patient's CHD Risk.        ATP III CLASSIFICATION (LDL):  <100     mg/dL   Optimal  100-129  mg/dL   Near or Above  Optimal  130-159  mg/dL   Borderline  160-189  mg/dL   High  >190      mg/dL   Very High   CREATININE 1.15* 03/23/2015       Scheduled Meds: . antiseptic oral rinse  7 mL Mouth Rinse q12n4p  . aspirin EC  81 mg Oral Daily  . atorvastatin  20 mg Oral Daily  . carvedilol  12.5 mg Oral BID WC  . chlorhexidine  15 mL Mouth Rinse BID  . enoxaparin (LOVENOX) injection  40 mg Subcutaneous Q24H  . famotidine  20 mg Oral Daily  . feeding supplement (ENSURE ENLIVE)  237 mL Oral BID BM  . ferrous sulfate  325 mg Oral Q breakfast  . fluticasone  1 spray Each Nare Daily  . furosemide  40 mg Intravenous BID  . isosorbide mononitrate  30 mg Oral Daily  . levothyroxine  50 mcg Oral QAC breakfast  . lisinopril  5 mg Oral Daily  . mometasone-formoterol  2 puff Inhalation BID  . pantoprazole  40 mg Oral BID  . regadenoson  0.4 mg Intravenous Once  . sodium chloride  3 mL Intravenous Q12H   Continuous Infusions:    Principal Problem:   Acute CHF Active Problems:   Anemia   HTN (hypertension)   DM (dermatomyositis)   Colostomy in place   COPD (chronic obstructive pulmonary disease)   Pulmonary infiltrate on chest x-ray   Acute respiratory failure, unspecified whether with hypoxia or hypercapnia    Time spent: 45 minutes   [facility] Pager [phone]. If 7PM-7AM, please contact night-coverage at [email or web address], password [facility] 03/23/2015, 10:58 AM  LOS: 5 days

## 2015-03-24 DIAGNOSIS — D638 Anemia in other chronic diseases classified elsewhere: Secondary | ICD-10-CM

## 2015-03-24 DIAGNOSIS — I5023 Acute on chronic systolic (congestive) heart failure: Principal | ICD-10-CM

## 2015-03-24 DIAGNOSIS — J9600 Acute respiratory failure, unspecified whether with hypoxia or hypercapnia: Secondary | ICD-10-CM

## 2015-03-24 LAB — CULTURE, BLOOD (ROUTINE X 2)
Culture: NO GROWTH
Culture: NO GROWTH

## 2015-03-24 LAB — COMPREHENSIVE METABOLIC PANEL
ALK PHOS: 43 U/L (ref 38–126)
ALT: 10 U/L — AB (ref 14–54)
ANION GAP: 11 (ref 5–15)
AST: 15 U/L (ref 15–41)
Albumin: 2.9 g/dL — ABNORMAL LOW (ref 3.5–5.0)
BUN: 16 mg/dL (ref 6–20)
CHLORIDE: 96 mmol/L — AB (ref 101–111)
CO2: 32 mmol/L (ref 22–32)
Calcium: 9.5 mg/dL (ref 8.9–10.3)
Creatinine, Ser: 1.27 mg/dL — ABNORMAL HIGH (ref 0.44–1.00)
GFR calc Af Amer: 46 mL/min — ABNORMAL LOW (ref >60)
GFR calc non Af Amer: 39 mL/min — ABNORMAL LOW (ref >60)
GLUCOSE: 90 mg/dL (ref 65–99)
POTASSIUM: 3.3 mmol/L — AB (ref 3.5–5.1)
SODIUM: 139 mmol/L (ref 135–145)
TOTAL PROTEIN: 7.2 g/dL (ref 6.5–8.1)
Total Bilirubin: 0.6 mg/dL (ref 0.3–1.2)

## 2015-03-24 LAB — OCCULT BLOOD X 1 CARD TO LAB, STOOL: Fecal Occult Bld: NEGATIVE

## 2015-03-24 LAB — CBC
HEMATOCRIT: 31.4 % — AB (ref 36.0–46.0)
Hemoglobin: 9.9 g/dL — ABNORMAL LOW (ref 12.0–15.0)
MCH: 27 pg (ref 26.0–34.0)
MCHC: 31.5 g/dL (ref 30.0–36.0)
MCV: 85.6 fL (ref 78.0–100.0)
Platelets: 316 10*3/uL (ref 150–400)
RBC: 3.67 MIL/uL — ABNORMAL LOW (ref 3.87–5.11)
RDW: 17 % — ABNORMAL HIGH (ref 11.5–15.5)
WBC: 6.5 10*3/uL (ref 4.0–10.5)

## 2015-03-24 MED ORDER — LISINOPRIL 5 MG PO TABS: 5.0000 mg | ORAL_TABLET | Freq: Every day | ORAL | Status: DC

## 2015-03-24 MED ORDER — POTASSIUM CHLORIDE CRYS ER 20 MEQ PO TBCR
40.0000 meq | EXTENDED_RELEASE_TABLET | Freq: Once | ORAL | Status: AC
  Administered 2015-03-24: 40 meq via ORAL
  Filled 2015-03-24: qty 2

## 2015-03-24 MED ORDER — FUROSEMIDE 40 MG PO TABS
40.0000 mg | ORAL_TABLET | Freq: Every day | ORAL | Status: DC
  Administered 2015-03-24: 40 mg via ORAL
  Filled 2015-03-24: qty 1

## 2015-03-24 MED ORDER — FUROSEMIDE 40 MG PO TABS: 40.0000 mg | ORAL_TABLET | Freq: Every day | ORAL | Status: DC

## 2015-03-24 MED ORDER — ISOSORBIDE MONONITRATE ER 30 MG PO TB24: 30.0000 mg | ORAL_TABLET | Freq: Every day | ORAL | Status: DC

## 2015-03-24 MED ORDER — HYDROCODONE-ACETAMINOPHEN 5-325 MG PO TABS: 1.0000 | ORAL_TABLET | ORAL | Status: DC | PRN

## 2015-03-24 NOTE — Care Management Note (Signed)
Case Management Note  Patient Details  Name: [name] MRN: [id number] Date of Birth: [birth year]  Subjective/Objective:  Pt plan for d/c today.                   Action/Plan: CSW assisting with disposition needs. No further needs from CM at this time.    Expected Discharge Date:  03/20/15               Expected Discharge Plan:  Skilled Nursing Facility  In-House Referral:  Clinical Social Work  Discharge planning Services  CM Consult  Post Acute Care Choice:    Choice offered to:     DME Arranged:    DME Agency:     HH Arranged:    [place] Agency:     Status of Service:  Completed, signed off  Medicare Important Message Given:  Yes Date Medicare IM Given:  03/24/15 Medicare IM give by:  Jacqlyn Krauss, RN,BSN  Date Additional Medicare IM Given:    Additional Medicare Important Message give by:     If discussed at [place] of Stay Meetings, dates discussed:    Additional Comments:  Bethena Roys, RN 03/24/2015, 2:23 PM

## 2015-03-24 NOTE — Progress Notes (Signed)
OT Cancellation Note  Patient Details Name: [name] MRN: [id number] DOB: July 31, 1934   Cancelled Treatment:    Reason Eval/Treat Not Completed: Other (comment) (OT screened). Discharge summary in and pt is from SNF and planning to return to SNF. Will defer all further OT needs to next venue of care.  Benito Mccreedy OTR/L [phone] 03/24/2015, 12:00 PM

## 2015-03-24 NOTE — Discharge Summary (Signed)
Physician Discharge Summary  [name] EXH:[id number] DOB: Jul 23, 1934 DOA: 03/18/2015  PCP: No primary care provider on file.  Admit date: 03/18/2015 Discharge date: 03/24/2015  Time spent: 35 minutes  Recommendations for Outpatient Follow-up:  1. Discharged to skilled nursing facility with outpatient cardiology follow-up. 2. Please monitor daily weight and I/O  Discharge Diagnoses:  Principal Problem:   Systolic CHF, acute on chronic   Active Problems:   Anemia   HTN (hypertension)   DM (dermatomyositis)   Colostomy in place   COPD (chronic obstructive pulmonary disease)   Pulmonary infiltrate on chest x-ray   Acute respiratory failure, unspecified whether with hypoxia or hypercapnia   Discharge Condition: Fair  Diet recommendation: Heart healthy   CODE STATUS: Full code  Filed Weights   03/22/15 0400 03/23/15 0500 03/24/15 0555  Weight: 81.557 kg (179 lb 12.8 oz) 83.144 kg (183 lb 4.8 oz) 79.379 kg (175 lb)    History of present illness:  Please refer to admission H&P for details, in brief, 81 year old female with history of hypertension, asthma,? CHF, COPD, hypothyroidism, anemia, history of colon cancer status post surgery with colostomy, chronic kidney disease was sent to the hospital from skilled nursing facility with sudden onset of shortness of breath and diaphoresis.  Patient recently hospitalized for acute blood loss anemia, acute kidney injury and Pseudomonas UTI. Her Lasix was held due to acute kidney injury. Patient admitted to hospitalist service and workup done including CT angiogram of the chest which was negative for PE. She was placed on IV Lasix for adequate diuresis. 2-D echo done showed an EF of 35-40%. Patient seen by cardiology and underwent cardiac catheterization which showed increased left ventricular end diastolic pressure and CVP.   Hospital Course:  Acute systolic CHF 2-D echo with EF of 35-40% with inferior wall and septal hypokinesis.  Cardiac cath with nonischemic cardiomyopathy, moderate to severe LV dysfunction.RHC showed normal wrist pressure with elevated LVEDP of 23-25 mmHg. Recommend optimal medical management. -Continued aspirin, Coreg and statin. Added ACE inhibitor and indoor.  -Recent diuresed well and switched to by mouth Lasix. -She will follow-up with cardiology as outpatient. (Appointment scheduled)   Active problem Anemia of chronic disease Patient recently hospitalized for acute anemia requiring transfusion. Hemoglobin dropped to 7.4 from baseline of 9-10 and received 1 unit PRBC. Has been stable since transfusion. School stool for occult blood has been negative. Continue ranitidine and ferrous sulfate. May need GI workup if drop in  H&H continues.  Elevated troponin Likely secondary to nonischemic cardiomyopathy. Cardiac cath results as above. Continue aspirin, statin and beta blocker.   Essential hypertension Stable. Continue Coreg, Lasix, lisinopril and indoor.  Elevated d-dimer CT angiogram negative for PE. Venous Doppler negative for DVT   Hypokalemia Replenished  Colostomy status  Hypothyroidism Continue Synthroid  Protein calorie malnutrition Continue supplements  Procedures:  CT angiogram of the chest  Doppler lower extremity  2-D echo  Right and left heart catheterization   Consultations:  Cardiology     Family communication: None at bedside  Disposition: Return to skilled nursing facility  Discharge Exam: Filed Vitals:   03/24/15 0513  BP: 129/45  Pulse: 80  Temp: 98.6 F (37 C)  Resp: 19    General: Elderly female in no acute distress HEENT: No pallor, moist oral mucosa, supple neck, no JVD Chest: Fine bibasilar crackles, no rhonchi or wheeze CVS: Normal S1 and S2, no murmurs rub or gallop GI: Soft, nondistended, nontender, bowel sounds present, colostomy status Musculoskeletal: Warm,  no edema CNS: Alert and oriented X2-3  Discharge  instructions  Current Discharge Medication List    START taking these medications   Details  isosorbide mononitrate (IMDUR) 30 MG 24 hr tablet Take 1 tablet (30 mg total) by mouth daily. Qty: 30 tablet, Refills: 0    lisinopril (PRINIVIL,ZESTRIL) 5 MG tablet Take 1 tablet (5 mg total) by mouth daily. Qty: 30 tablet, Refills: 0      CONTINUE these medications which have CHANGED   Details  furosemide (LASIX) 40 MG tablet Take 1 tablet (40 mg total) by mouth daily. Qty: 30 tablet, Refills: 5           CONTINUE these medications which have NOT CHANGED   Details  acetaminophen (TYLENOL) 325 MG tablet Take 650 mg by mouth every 6 (six) hours as needed (pain).    ADVAIR DISKUS 500-50 MCG/DOSE AEPB Inhale 1 puff into the lungs every 12 (twelve) hours. Refills: 3    atorvastatin (LIPITOR) 20 MG tablet Take 20 mg by mouth daily.    carvedilol (COREG) 6.25 MG tablet Take 1 tablet (6.25 mg total) by mouth 2 (two) times daily with a meal.    cholecalciferol (VITAMIN D) 1000 UNITS tablet Take 1,000 Units by mouth daily at 6 PM.    diclofenac sodium (VOLTAREN) 1 % GEL Apply 4 g topically 3 (three) times daily.    feeding supplement, ENSURE ENLIVE, (ENSURE ENLIVE) LIQD Take 237 mLs by mouth 2 (two) times daily between meals. Qty: 237 mL, Refills: 12    ferrous sulfate 325 (65 FE) MG tablet Take 325 mg by mouth every morning.    fluticasone (FLONASE) 50 MCG/ACT nasal spray Place 1 spray into both nostrils daily.    levothyroxine (SYNTHROID, LEVOTHROID) 50 MCG tablet Take 50 mcg by mouth every morning.  Refills: 2    Omega 3 1000 MG CAPS Take 1,000 mg by mouth daily at 6 PM.     oxymetazoline (AFRIN) 0.05 % nasal spray Place 1 spray into both nostrils 2 (two) times daily as needed (nose bleed). Qty: 30 mL, Refills: 0    ranitidine (ZANTAC) 150 MG tablet Take 150 mg by mouth daily at 6 PM.  Refills: 11    aspirin EC 81 MG tablet Take 1 tablet (81 mg total) by mouth every morning.     capsicum (ZOSTRIX) 0.075 % topical cream Apply topically 2 (two) times daily. Qty: 28.3 g, Refills: 0      STOP taking these medications     ciprofloxacin (CIPRO) 500 MG tablet      pravastatin (PRAVACHOL) 20 MG tablet   HYDROcodone-acetaminophen (NORCO/VICODIN) 5-325 MG per tablet .        No Known Allergies Follow-up Information    Follow up with Erlene Quan, PA-C On 04/14/2015.   Specialties:  Cardiology, Radiology   Why:  8:00am. Note Dr. Francesca Oman office is in [facility] where pt obtain the lab, however schedule full for 1.5 month, therefore 1st followup arranged with PA at [facility] information:   [place] [address] [phone]       Follow up with [place] On 03/30/2015.   Specialty:  Cardiology   Why:  obtain BMET lab at [facility] office on 6/21 to check kidney function after starting lasix   Contact information:   [address] [place] [phone]       The results of significant  diagnostics from this hospitalization (including imaging, microbiology, ancillary and laboratory) are listed below for reference.    Significant Diagnostic Studies: Dg Chest 2 View  03/18/2015   CLINICAL DATA:  Followup right lower lobe pneumonia. Congestive heart failure. COPD.  EXAM: CHEST  2 VIEW  COMPARISON:  Prior today on 03/18/2015  FINDINGS: Cardiomegaly is stable. Dual lead transvenous pacemaker remains in appropriate position.  There is mild improvement in airspace opacity in the right lower lobe since previous study. Diffuse pulmonary vascular congestion is demonstrated, without evidence of frank pulmonary edema.  IMPRESSION: Mild improvement in airspace opacity in right lower lobe since prior exam.  Stable cardiomegaly and diffuse pulmonary vascular congestion.   Electronically Signed   By: Earle Gell M.D.   On: 03/18/2015 17:43   Ct Angio Chest Pe W/cm &/or Wo  Cm  03/19/2015   CLINICAL DATA:  Shortness of breath x several days, pulmonary infiltrate on chest radiograph  EXAM: CT ANGIOGRAPHY CHEST WITH CONTRAST  TECHNIQUE: Multidetector CT imaging of the chest was performed using the standard protocol during bolus administration of intravenous contrast. Multiplanar CT image reconstructions and MIPs were obtained to evaluate the vascular anatomy.  CONTRAST:  21mL OMNIPAQUE IOHEXOL 350 MG/ML SOLN  COMPARISON:  Chest radiograph dated 03/18/2015  FINDINGS: No evidence of pulmonary embolism.  Mediastinum/Nodes: Cardiomegaly.  Small pericardial effusion.  Left subclavian ICD.  Atherosclerotic calcifications of the aortic arch.  Small mediastinal lymph nodes, favored to be reactive, including:  --9 mm short axis prevascular node (series 5/ image 33)  --10 mm short axis subcarinal node (series 5/ image 43)  --13 mm short axis left hilar node (series 5/image 47)  Enlarged left axillary nodes measuring up to 15 mm short axis (series 5/ image 26), abnormal, although poorly evaluated due to streak artifact from the patient's pacemaker.  Small right axillary nodes.  Small left supraclavicular nodes measuring up to 8 mm short axis (series 5/ image 11), within the upper limits of normal.  Lungs/Pleura: Mild patchy opacities in the lingula and bilateral lower lobes, likely atelectasis.  No focal consolidation.  8 mm pleural-based nodule overlying the right middle lobe (series 12/image 54), unchanged since 2010.  No suspicious pulmonary nodules.  No frank interstitial edema.  No pleural effusion or pneumothorax.  Upper abdomen: Visualized upper abdomen is unremarkable.  Musculoskeletal: Degenerative changes of the visualized thoracolumbar spine.  Review of the MIP images confirms the above findings.  IMPRESSION: No evidence of pulmonary embolism.  No frank interstitial edema.  Small supraclavicular and thoracic lymph nodes, most of which can be considered reactive, although left axillary  nodes are enlarged up to 15 mm which is considered abnormal. Correlate for signs/symptoms of lymphoproliferative disorder or left breast malignancy.   Electronically Signed   By: Julian Hy M.D.   On: 03/19/2015 18:08   Nm Myocar Single W/spect W/wall Motion And Ef  03/20/2015    This is a low risk study.  Defect 1: There is a defect present in the mid anteroseptal, apical  anterior and apical septal location.   Rest only imaging.  There is a moderate region of decreased radiotracer  uptake in the anteroseptal wall of the mid ventricle and apex which could  represent breast attenuation artifact or a region of prior infarct/scar.    Dg Chest Port 1 View  03/18/2015   CLINICAL DATA:  Dyspnea, onset tonight  EXAM: PORTABLE CHEST - 1 VIEW  COMPARISON:  03/01/2015  FINDINGS: There is mild unchanged cardiomegaly.  There are intact appearances of the transvenous leads.  There is new right base infiltrate and this may represent pneumonia. There is no large effusion. The left lung is clear.  IMPRESSION: Right base infiltrate.   Electronically Signed   By: Andreas Newport M.D.   On: 03/18/2015 04:09   Dg Chest Port 1 View  03/01/2015   CLINICAL DATA:  Sudden onset of fever today.  EXAM: PORTABLE CHEST - 1 VIEW  COMPARISON:  02/13/2009.  FINDINGS: Cardiac silhouette mildly enlarged. No mediastinal hilar masses. Left anterior chest wall sequential pacemaker leads project over the right atrium right ventricle.  No lung consolidation or edema. No pleural effusion or pneumothorax.  Bony thorax is demineralized but grossly intact.  IMPRESSION: No acute cardiopulmonary disease.   Electronically Signed   By: Lajean Manes M.D.   On: 03/01/2015 21:20    Microbiology: Recent Results (from the past 240 hour(s))  MRSA PCR Screening     Status: None   Collection Time: 03/18/15  6:06 AM  Result Value Ref Range Status   MRSA by PCR NEGATIVE NEGATIVE Final    Comment:        The GeneXpert MRSA Assay (FDA approved  for NASAL specimens only), is one component of a comprehensive MRSA colonization surveillance program. It is not intended to diagnose MRSA infection nor to guide or monitor treatment for MRSA infections.   Culture, blood (routine x 2)     Status: None   Collection Time: 03/18/15  7:05 AM  Result Value Ref Range Status   Specimen Description BLOOD RIGHT ANTECUBITAL  Final   Special Requests BOTTLES DRAWN AEROBIC ONLY 10CC  Final   Culture   Final    NO GROWTH 5 DAYS Note: Culture results may be compromised due to an excessive volume of blood received in culture bottles. Performed at [facility]    Report Status 03/24/2015 FINAL  Final  Culture, blood (routine x 2)     Status: None   Collection Time: 03/18/15  7:20 AM  Result Value Ref Range Status   Specimen Description BLOOD LEFT ANTECUBITAL  Final   Special Requests BOTTLES DRAWN AEROBIC ONLY 10CC  Final   Culture   Final    NO GROWTH 5 DAYS Performed at [facility]    Report Status 03/24/2015 FINAL  Final     Labs: Basic Metabolic Panel:  Recent Labs Lab 03/20/15 0500 03/20/15 1325 03/21/15 0510 03/22/15 0643 03/23/15 0510 03/24/15 0508  NA 138  --  139 141 141 139  K 3.3*  --  3.4* 3.7 3.6 3.3*  CL 98*  --  98* 100* 100* 96*  CO2 28  --  31 32 33* 32  GLUCOSE 69  --  81 82 87 90  BUN 10  --  11 10 11 16   CREATININE 1.26*  --  1.11* 1.02* 1.15* 1.27*  CALCIUM 8.7*  --  9.0 9.4 9.7 9.5  MG  --  1.3*  --   --   --   --    Liver Function Tests:  Recent Labs Lab 03/20/15 0500 03/21/15 0510 03/22/15 0643 03/23/15 0510 03/24/15 0508  AST 12* 14* 15 16 15   ALT 9* 9* 10* 11* 10*  ALKPHOS 41 40 41 41 43  BILITOT 0.3 0.3 0.6 0.5 0.6  PROT 6.4* 6.6 6.8 7.1 7.2  ALBUMIN 2.7* 2.8* 2.8* 2.9* 2.9*   No results for input(s): LIPASE, AMYLASE in the last 168 hours. No results  for input(s): AMMONIA in the last 168 hours. CBC:  Recent Labs Lab 03/20/15 2210 03/21/15 0510 03/22/15 0643  03/23/15 0510 03/24/15 0508  WBC 5.7 5.5 5.2 6.3 6.5  HGB 7.8* 7.8* 9.2* 9.7* 9.9*  HCT 25.3* 25.4* 30.4* 31.3* 31.4*  MCV 87.2 86.7 86.1 85.8 85.6  PLT 343 332 311 312 316   Cardiac Enzymes:  Recent Labs Lab 03/18/15 0705 03/18/15 1428 03/18/15 2008 03/19/15 0235  TROPONINI 0.05* 0.04* 0.04* 0.03   BNP: BNP (last 3 results)  Recent Labs  03/18/15 0305  BNP 1270.3*    ProBNP (last 3 results) No results for input(s): PROBNP in the last 8760 hours.  CBG:  Recent Labs Lab 03/22/15 1117  GLUCAP 75       Signed:  Ariell Gunnels  [facility] 03/24/2015, 9:31 AM

## 2015-03-24 NOTE — Progress Notes (Signed)
Patient Name: [name] Date of Encounter: 03/24/2015  Primary Cardiologist: new - Dr. Meda Coffee   Principal Problem:   Acute CHF Active Problems:   Anemia   HTN (hypertension)   DM (dermatomyositis)   Colostomy in place   COPD (chronic obstructive pulmonary disease)   Pulmonary infiltrate on chest x-ray   Acute respiratory failure, unspecified whether with hypoxia or hypercapnia    SUBJECTIVE  Denies any CP or SOB.   CURRENT MEDS . antiseptic oral rinse  7 mL Mouth Rinse q12n4p  . aspirin EC  81 mg Oral Daily  . atorvastatin  20 mg Oral Daily  . carvedilol  12.5 mg Oral BID WC  . chlorhexidine  15 mL Mouth Rinse BID  . enoxaparin (LOVENOX) injection  40 mg Subcutaneous Q24H  . famotidine  20 mg Oral Daily  . feeding supplement (ENSURE ENLIVE)  237 mL Oral BID BM  . ferrous sulfate  325 mg Oral Q breakfast  . fluticasone  1 spray Each Nare Daily  . furosemide  40 mg Oral Daily  . isosorbide mononitrate  30 mg Oral Daily  . levothyroxine  50 mcg Oral QAC breakfast  . lisinopril  5 mg Oral Daily  . mometasone-formoterol  2 puff Inhalation BID  . pantoprazole  40 mg Oral BID  . regadenoson  0.4 mg Intravenous Once  . sodium chloride  3 mL Intravenous Q12H    OBJECTIVE  Filed Vitals:   03/23/15 2100 03/24/15 0001 03/24/15 0513 03/24/15 0555  BP:  121/56 129/45   Pulse:  78 80   Temp:  99.2 F (37.3 C) 98.6 F (37 C)   TempSrc:  Oral Oral   Resp:  18 19   Height:      Weight:    175 lb (79.379 kg)  SpO2: 100% 100% 100%     Intake/Output Summary (Last 24 hours) at 03/24/15 0739 Last data filed at 03/24/15 0545  Gross per 24 hour  Intake    190 ml  Output    325 ml  Net   -135 ml   Filed Weights   03/22/15 0400 03/23/15 0500 03/24/15 0555  Weight: 179 lb 12.8 oz (81.557 kg) 183 lb 4.8 oz (83.144 kg) 175 lb (79.379 kg)    PHYSICAL EXAM  General: Pleasant, NAD. Neuro: Alert and oriented X 3. Moves all extremities spontaneously. Psych: Normal  affect. HEENT:  Normal  Neck: Supple without bruits or JVD. Lungs:  Resp regular and unlabored. Bibasilar rale. Heart: RRR no s3, s4, or murmurs. Abdomen: Soft, non-tender, non-distended, BS + x 4.  Extremities: No clubbing, cyanosis or edema. DP/PT/Radials 2+ and equal bilaterally.  Accessory Clinical Findings  CBC  Recent Labs  03/23/15 0510 03/24/15 0508  WBC 6.3 6.5  HGB 9.7* 9.9*  HCT 31.3* 31.4*  MCV 85.8 85.6  PLT 312 176   Basic Metabolic Panel  Recent Labs  03/23/15 0510 03/24/15 0508  NA 141 139  K 3.6 3.3*  CL 100* 96*  CO2 33* 32  GLUCOSE 87 90  BUN 11 16  CREATININE 1.15* 1.27*  CALCIUM 9.7 9.5   Liver Function Tests  Recent Labs  03/23/15 0510 03/24/15 0508  AST 16 15  ALT 11* 10*  ALKPHOS 41 43  BILITOT 0.5 0.6  PROT 7.1 7.2  ALBUMIN 2.9* 2.9*    TELE Paced rhythm with HR 70s    ECG  No new EKG  Echocardiogram 03/19/2015  - Left ventricle: Technically limited study.  Hypokinesis of the inferior wall, and the septum and the anterior wall. The cavity size was normal. Wall thickness was increased in a pattern of moderate LVH. Systolic function was moderately reduced. The estimated ejection fraction was in the range of 35% to 40%. - Aortic valve: Sclerosis without stenosis. There was trivial regurgitation. - Mitral valve: Moderately calcified annulus. There was no evidence for stenosis. - Right ventricle: The cavity size was mildly dilated. Pacer wire or catheter noted in right ventricle. Systolic function was normal. - Right atrium: The atrium was mildly dilated. - Pericardium, extracardiac: There is small/moderate posterior pericardial effusion.    Radiology/Studies  Dg Chest 2 View  03/18/2015   CLINICAL DATA:  Followup right lower lobe pneumonia. Congestive heart failure. COPD.  EXAM: CHEST  2 VIEW  COMPARISON:  Prior today on 03/18/2015  FINDINGS: Cardiomegaly is stable. Dual lead transvenous pacemaker  remains in appropriate position.  There is mild improvement in airspace opacity in the right lower lobe since previous study. Diffuse pulmonary vascular congestion is demonstrated, without evidence of frank pulmonary edema.  IMPRESSION: Mild improvement in airspace opacity in right lower lobe since prior exam.  Stable cardiomegaly and diffuse pulmonary vascular congestion.   Electronically Signed   By: Earle Gell M.D.   On: 03/18/2015 17:43   Ct Angio Chest Pe W/cm &/or Wo Cm  03/19/2015   CLINICAL DATA:  Shortness of breath x several days, pulmonary infiltrate on chest radiograph  EXAM: CT ANGIOGRAPHY CHEST WITH CONTRAST  TECHNIQUE: Multidetector CT imaging of the chest was performed using the standard protocol during bolus administration of intravenous contrast. Multiplanar CT image reconstructions and MIPs were obtained to evaluate the vascular anatomy.  CONTRAST:  47mL OMNIPAQUE IOHEXOL 350 MG/ML SOLN  COMPARISON:  Chest radiograph dated 03/18/2015  FINDINGS: No evidence of pulmonary embolism.  Mediastinum/Nodes: Cardiomegaly.  Small pericardial effusion.  Left subclavian ICD.  Atherosclerotic calcifications of the aortic arch.  Small mediastinal lymph nodes, favored to be reactive, including:  --9 mm short axis prevascular node (series 5/ image 33)  --10 mm short axis subcarinal node (series 5/ image 43)  --13 mm short axis left hilar node (series 5/image 47)  Enlarged left axillary nodes measuring up to 15 mm short axis (series 5/ image 26), abnormal, although poorly evaluated due to streak artifact from the patient's pacemaker.  Small right axillary nodes.  Small left supraclavicular nodes measuring up to 8 mm short axis (series 5/ image 11), within the upper limits of normal.  Lungs/Pleura: Mild patchy opacities in the lingula and bilateral lower lobes, likely atelectasis.  No focal consolidation.  8 mm pleural-based nodule overlying the right middle lobe (series 12/image 54), unchanged since 2010.  No  suspicious pulmonary nodules.  No frank interstitial edema.  No pleural effusion or pneumothorax.  Upper abdomen: Visualized upper abdomen is unremarkable.  Musculoskeletal: Degenerative changes of the visualized thoracolumbar spine.  Review of the MIP images confirms the above findings.  IMPRESSION: No evidence of pulmonary embolism.  No frank interstitial edema.  Small supraclavicular and thoracic lymph nodes, most of which can be considered reactive, although left axillary nodes are enlarged up to 15 mm which is considered abnormal. Correlate for signs/symptoms of lymphoproliferative disorder or left breast malignancy.   Electronically Signed   By: Julian Hy M.D.   On: 03/19/2015 18:08   Nm Myocar Single W/spect W/wall Motion And Ef  03/20/2015    This is a low risk study.  Defect 1: There is a  defect present in the mid anteroseptal, apical  anterior and apical septal location.   Rest only imaging.  There is a moderate region of decreased radiotracer  uptake in the anteroseptal wall of the mid ventricle and apex which could  represent breast attenuation artifact or a region of prior infarct/scar.    Dg Chest Port 1 View  03/18/2015   CLINICAL DATA:  Dyspnea, onset tonight  EXAM: PORTABLE CHEST - 1 VIEW  COMPARISON:  03/01/2015  FINDINGS: There is mild unchanged cardiomegaly. There are intact appearances of the transvenous leads.  There is new right base infiltrate and this may represent pneumonia. There is no large effusion. The left lung is clear.  IMPRESSION: Right base infiltrate.   Electronically Signed   By: Andreas Newport M.D.   On: 03/18/2015 04:09   Dg Chest Port 1 View  03/01/2015   CLINICAL DATA:  Sudden onset of fever today.  EXAM: PORTABLE CHEST - 1 VIEW  COMPARISON:  02/13/2009.  FINDINGS: Cardiac silhouette mildly enlarged. No mediastinal hilar masses. Left anterior chest wall sequential pacemaker leads project over the right atrium right ventricle.  No lung consolidation or  edema. No pleural effusion or pneumothorax.  Bony thorax is demineralized but grossly intact.  IMPRESSION: No acute cardiopulmonary disease.   Electronically Signed   By: Lajean Manes M.D.   On: 03/01/2015 21:20    ASSESSMENT AND PLAN  81 y.o. female with Past medical history of hypertension, asthma, ? Prior CHF managed by her PCP, COPD, hypothyroidism, anemia, colostomy, chronic kidney disease. She presented with complaints of sudden onset of shortness of breath associated with diaphoresis, no chest pain. Did resting portion of myoview on 6/9 before d-dimer came positive at 4.6. Stress portion cancelled and CTA ordered 6/10, negative for PE. L&RHC 6/13 minimal CAD, CI/CO stable.   1. Acute systolic CHF - echo 6/44/0347 EF 35-40%, hypokinesis of inferior wall, septal wall, small/moderate pericardial effusion - continue coreg, imdur, and lisinopril. ASA and statin.   - bibasilar rale likely atelectasis - LHC normal coronaries. RHC showed normal wedge pressure of 11-12mmHg with elevated LVEDP of 23-72mmHg. Transitioned to 40mg  PO lasix daily. Will need BMET in 1 week (appt already made). Followup with Dr. Francesca Oman PA (appt made). [facility] for discharge today  2. Anemia Hgb stable  3. HTN  Labile. Continue to monitor. Coreg 12.5 BID, lisinopril 5QD  4. Colostomy in place 5. COPD (chronic obstructive pulmonary disease) 6. HLD Lipitor.  7. AICD: (Dr. Dalbert Batman in [facility] in 2005) medtr[employer] normal function   8. dermatomyositis  Signed, Almyra Deforest PA-C Pager: [phone]  Agree with note by Almyra Deforest PA-C  OK for DC. Med Rx of NISCM. F/U with Dr. Meda Coffee. Will also need to be established with on of the EP MDs for ICD F/U    Lorretta Harp, M.D., [place], [facility], [place]. [place],   [id number]  [phone] 03/24/2015 10:12 AM

## 2015-03-24 NOTE — Progress Notes (Signed)
Pt discharged to [facility] [place], per EMS via stretcher, condition stable, son notified.

## 2015-03-24 NOTE — Discharge Instructions (Signed)
Limit amount of salt intake, limit daily fluid intake to <2L per day Weight yourself every morning, contact cardiology if weight increase by more than 3 lbs overnight or 5 lbs in a single week. Contact cardiology if has increasing SOB.

## 2015-03-24 NOTE — Discharge Planning (Signed)
Patient to be discharged back to [place]. Patient updated at bedside.  Facility: [facility] RN report number: [phone] Transportation: EMS ([address]  Lubertha Sayres, [place] [phone]) and Surgical [phone])

## 2015-03-30 ENCOUNTER — Other Ambulatory Visit: Payer: Medicare HMO

## 2015-04-14 ENCOUNTER — Encounter: Payer: Self-pay | Admitting: Cardiology

## 2015-04-14 ENCOUNTER — Ambulatory Visit (INDEPENDENT_AMBULATORY_CARE_PROVIDER_SITE_OTHER): Payer: Medicare HMO | Admitting: Cardiology

## 2015-04-14 VITALS — BP 126/64 | HR 87 | Ht 66.0 in | Wt 181.0 lb

## 2015-04-14 DIAGNOSIS — Z7401 Bed confinement status: Secondary | ICD-10-CM

## 2015-04-14 DIAGNOSIS — IMO0001 Reserved for inherently not codable concepts without codable children: Secondary | ICD-10-CM | POA: Insufficient documentation

## 2015-04-14 DIAGNOSIS — I428 Other cardiomyopathies: Secondary | ICD-10-CM | POA: Insufficient documentation

## 2015-04-14 DIAGNOSIS — Z0389 Encounter for observation for other suspected diseases and conditions ruled out: Secondary | ICD-10-CM

## 2015-04-14 DIAGNOSIS — Z79899 Other long term (current) drug therapy: Secondary | ICD-10-CM

## 2015-04-14 DIAGNOSIS — I509 Heart failure, unspecified: Secondary | ICD-10-CM

## 2015-04-14 DIAGNOSIS — I429 Cardiomyopathy, unspecified: Secondary | ICD-10-CM

## 2015-04-14 DIAGNOSIS — Z95810 Presence of automatic (implantable) cardiac defibrillator: Secondary | ICD-10-CM | POA: Insufficient documentation

## 2015-04-14 DIAGNOSIS — I110 Hypertensive heart disease with heart failure: Secondary | ICD-10-CM | POA: Diagnosis not present

## 2015-04-14 LAB — CBC
HCT: 28.7 % — ABNORMAL LOW (ref 36.0–46.0)
Hemoglobin: 9.2 g/dL — ABNORMAL LOW (ref 12.0–15.0)
MCH: 27.4 pg (ref 26.0–34.0)
MCHC: 32.1 g/dL (ref 30.0–36.0)
MCV: 85.4 fL (ref 78.0–100.0)
MPV: 10.2 fL (ref 8.6–12.4)
Platelets: 288 10*3/uL (ref 150–400)
RBC: 3.36 MIL/uL — ABNORMAL LOW (ref 3.87–5.11)
RDW: 17 % — ABNORMAL HIGH (ref 11.5–15.5)
WBC: 6.6 10*3/uL (ref 4.0–10.5)

## 2015-04-14 NOTE — Assessment & Plan Note (Signed)
No chest pain

## 2015-04-14 NOTE — Assessment & Plan Note (Signed)
Discharged 03/24/15

## 2015-04-14 NOTE — Progress Notes (Signed)
04/14/2015 [name] Shari Prows   1934-09-11  [id number]  Primary Physician No primary care provider on file. Primary Cardiologist: Dr Sallyanne Kuster  HPI:  81 y/o AA female who was seen in consult by Dr Meda Coffee in May 2016 for CHF. Her history is confusing and the pt is a poor historian ( I suspect a degree of dementia). She apparently was sent from a SNF to the ED in May for anemia. There is a history of an ICD implanted in [place] in 2005 but the pt doesn't know who checks it or why she has it. It was interrogated during her hospital stay and reportedly was functioning normally. The pt had an echo showing her EF to be 35-40%. Her D-dimer was but CTA negative. Lexiscan was low risk. She had an appointment for 8 am today and came to the office at 1pm. She denies any chest pain or dyspnea. She has chronic LE edema.   Current Outpatient Prescriptions  Medication Sig Dispense Refill  . acetaminophen (TYLENOL) 325 MG tablet Take 650 mg by mouth every 6 (six) hours as needed (pain).    . ADVAIR DISKUS 500-50 MCG/DOSE AEPB Inhale 1 puff into the lungs every 12 (twelve) hours.  3  . aspirin EC 81 MG tablet Take 1 tablet (81 mg total) by mouth every morning.    Marland Kitchen atorvastatin (LIPITOR) 20 MG tablet Take 20 mg by mouth daily.    . capsicum (ZOSTRIX) 0.075 % topical cream Apply topically 2 (two) times daily. 28.3 g 0  . carvedilol (COREG) 6.25 MG tablet Take 1 tablet (6.25 mg total) by mouth 2 (two) times daily with a meal.    . cholecalciferol (VITAMIN D) 1000 UNITS tablet Take 1,000 Units by mouth daily at 6 PM.    . diclofenac sodium (VOLTAREN) 1 % GEL Apply 4 g topically 3 (three) times daily.    . feeding supplement, ENSURE ENLIVE, (ENSURE ENLIVE) LIQD Take 237 mLs by mouth 2 (two) times daily between meals. 237 mL 12  . ferrous sulfate 325 (65 FE) MG tablet Take 325 mg by mouth every morning.    . fluticasone (FLONASE) 50 MCG/ACT nasal spray Place 1 spray into both nostrils daily.    . furosemide (LASIX) 40 MG  tablet Take 1 tablet (40 mg total) by mouth daily. 30 tablet 5  . isosorbide mononitrate (IMDUR) 30 MG 24 hr tablet Take 1 tablet (30 mg total) by mouth daily. 30 tablet 0  . levothyroxine (SYNTHROID, LEVOTHROID) 50 MCG tablet Take 50 mcg by mouth every morning.   2  . lisinopril (PRINIVIL,ZESTRIL) 5 MG tablet Take 1 tablet (5 mg total) by mouth daily. 30 tablet 0  . Omega 3 1000 MG CAPS Take 1,000 mg by mouth daily at 6 PM.     . oxymetazoline (AFRIN) 0.05 % nasal spray Place 1 spray into both nostrils 2 (two) times daily as needed (nose bleed). 30 mL 0  . ranitidine (ZANTAC) 150 MG tablet Take 150 mg by mouth daily at 6 PM.   11   No current facility-administered medications for this visit.    No Known Allergies  History   Social History  . Marital Status: Divorced    Spouse Name: N/A  . Number of Children: N/A  . Years of Education: N/A   Occupational History  . Retired Psychologist, counselling    Social History Main Topics  . Smoking status: Never Smoker   . Smokeless tobacco: Never Used  . Alcohol Use:  No  . Drug Use: No  . Sexual Activity: No   Other Topics Concern  . Not on file   Social History Narrative     Review of Systems: General: negative for chills, fever, night sweats or weight changes.  Cardiovascular: negative for chest pain, dyspnea on exertion, edema, orthopnea, palpitations, paroxysmal nocturnal dyspnea or shortness of breath Dermatological: negative for rash Respiratory: negative for cough or wheezing Urologic: negative for hematuria Abdominal: negative for nausea, vomiting, diarrhea, bright red blood per rectum, melena, or hematemesis Neurologic: negative for visual changes, syncope, or dizziness All other systems reviewed and are otherwise negative except as noted above.    Blood pressure 126/64, pulse 87, height 5\' 6"  (1.676 m), weight 181 lb (82.101 kg).  General appearance: alert, cooperative and no distress Neck: no carotid bruit and no  JVD Lungs: end inspiratory crackles bilateral bases Heart: regular rate and rhythm Extremities: trace edema, boot on LLE Neurologic: Grossly normal  EKG NSR, LBBB  ASSESSMENT AND PLAN:   Acute combined systolic and diastolic heart failure Discharged 03/24/15  Non-ischemic cardiomyopathy EF 35-40% by echo June 2016  Hypertensive cardiovascular disease Moderate LVH  Normal coronary arteries 03/22/15 No chest pain  Bedridden She is a resident [facility]. She tells me she is there for arthritis in her ankle but I suspect she has some degree of dementia.  ICD in place MDT device implanted in '05 in [facility]   PLAN  Check labs. It appears we are now following her cardiac issues so will arrange for her to be established with Dr Sallyanne Kuster who can follow her ICD.   Kerin Ransom K PA-C 04/14/2015 2:27 PM

## 2015-04-14 NOTE — Assessment & Plan Note (Signed)
EF 35-40% by echo June 2016

## 2015-04-14 NOTE — Assessment & Plan Note (Signed)
She is a resident [facility]. She tells me she is there for arthritis in her ankle but I suspect she has some degree of dementia.

## 2015-04-14 NOTE — Patient Instructions (Addendum)
Your physician wants you to follow-up in: 3 Months with Dr Sallyanne Kuster with Pacer check. You will receive a reminder letter in the mail two months in advance. If you don't receive a letter, please call our office to schedule the follow-up appointment.  Your physician recommends that you return for lab work BMP and CBC

## 2015-04-14 NOTE — Assessment & Plan Note (Signed)
MDT device implanted in '05 in [place]

## 2015-04-14 NOTE — Assessment & Plan Note (Signed)
Moderate LVH

## 2015-04-15 LAB — BASIC METABOLIC PANEL
BUN: 14 mg/dL (ref 6–23)
CO2: 26 mEq/L (ref 19–32)
Calcium: 9.2 mg/dL (ref 8.4–10.5)
Chloride: 104 mEq/L (ref 96–112)
Creat: 0.83 mg/dL (ref 0.50–1.10)
Glucose, Bld: 79 mg/dL (ref 70–99)
Potassium: 3.9 mEq/L (ref 3.5–5.3)
Sodium: 142 mEq/L (ref 135–145)

## 2015-04-25 ENCOUNTER — Encounter (HOSPITAL_COMMUNITY): Payer: Self-pay

## 2015-04-25 ENCOUNTER — Inpatient Hospital Stay (HOSPITAL_COMMUNITY)
Admission: EM | Admit: 2015-04-25 | Discharge: 2015-04-28 | DRG: 291 | Disposition: A | Discharge status: Skilled Nursing Facility | Payer: Medicare HMO | Attending: Internal Medicine | Admitting: Internal Medicine

## 2015-04-25 ENCOUNTER — Emergency Department (HOSPITAL_COMMUNITY): Payer: Medicare HMO

## 2015-04-25 DIAGNOSIS — E785 Hyperlipidemia, unspecified: Secondary | ICD-10-CM | POA: Diagnosis present

## 2015-04-25 DIAGNOSIS — I5043 Acute on chronic combined systolic (congestive) and diastolic (congestive) heart failure: Secondary | ICD-10-CM | POA: Diagnosis not present

## 2015-04-25 DIAGNOSIS — I10 Essential (primary) hypertension: Secondary | ICD-10-CM | POA: Diagnosis not present

## 2015-04-25 DIAGNOSIS — E119 Type 2 diabetes mellitus without complications: Secondary | ICD-10-CM | POA: Diagnosis present

## 2015-04-25 DIAGNOSIS — J449 Chronic obstructive pulmonary disease, unspecified: Secondary | ICD-10-CM | POA: Diagnosis present

## 2015-04-25 DIAGNOSIS — Z90710 Acquired absence of both cervix and uterus: Secondary | ICD-10-CM | POA: Diagnosis not present

## 2015-04-25 DIAGNOSIS — I4581 Long QT syndrome: Secondary | ICD-10-CM

## 2015-04-25 DIAGNOSIS — I509 Heart failure, unspecified: Secondary | ICD-10-CM | POA: Diagnosis not present

## 2015-04-25 DIAGNOSIS — I429 Cardiomyopathy, unspecified: Secondary | ICD-10-CM | POA: Diagnosis present

## 2015-04-25 DIAGNOSIS — J45909 Unspecified asthma, uncomplicated: Secondary | ICD-10-CM | POA: Diagnosis present

## 2015-04-25 DIAGNOSIS — Z9841 Cataract extraction status, right eye: Secondary | ICD-10-CM | POA: Diagnosis not present

## 2015-04-25 DIAGNOSIS — Z933 Colostomy status: Secondary | ICD-10-CM

## 2015-04-25 DIAGNOSIS — J9620 Acute and chronic respiratory failure, unspecified whether with hypoxia or hypercapnia: Secondary | ICD-10-CM | POA: Diagnosis present

## 2015-04-25 DIAGNOSIS — IMO0001 Reserved for inherently not codable concepts without codable children: Secondary | ICD-10-CM

## 2015-04-25 DIAGNOSIS — N189 Chronic kidney disease, unspecified: Secondary | ICD-10-CM | POA: Diagnosis not present

## 2015-04-25 DIAGNOSIS — Z9049 Acquired absence of other specified parts of digestive tract: Secondary | ICD-10-CM | POA: Diagnosis present

## 2015-04-25 DIAGNOSIS — I129 Hypertensive chronic kidney disease with stage 1 through stage 4 chronic kidney disease, or unspecified chronic kidney disease: Secondary | ICD-10-CM | POA: Diagnosis present

## 2015-04-25 DIAGNOSIS — Z7982 Long term (current) use of aspirin: Secondary | ICD-10-CM | POA: Diagnosis not present

## 2015-04-25 DIAGNOSIS — I428 Other cardiomyopathies: Secondary | ICD-10-CM

## 2015-04-25 DIAGNOSIS — Z0389 Encounter for observation for other suspected diseases and conditions ruled out: Secondary | ICD-10-CM

## 2015-04-25 DIAGNOSIS — Z95810 Presence of automatic (implantable) cardiac defibrillator: Secondary | ICD-10-CM | POA: Diagnosis not present

## 2015-04-25 DIAGNOSIS — R0602 Shortness of breath: Secondary | ICD-10-CM

## 2015-04-25 DIAGNOSIS — M1990 Unspecified osteoarthritis, unspecified site: Secondary | ICD-10-CM | POA: Diagnosis present

## 2015-04-25 DIAGNOSIS — Z961 Presence of intraocular lens: Secondary | ICD-10-CM | POA: Diagnosis present

## 2015-04-25 DIAGNOSIS — J9600 Acute respiratory failure, unspecified whether with hypoxia or hypercapnia: Secondary | ICD-10-CM | POA: Diagnosis not present

## 2015-04-25 DIAGNOSIS — N179 Acute kidney failure, unspecified: Secondary | ICD-10-CM | POA: Diagnosis present

## 2015-04-25 DIAGNOSIS — Z85038 Personal history of other malignant neoplasm of large intestine: Secondary | ICD-10-CM | POA: Diagnosis not present

## 2015-04-25 DIAGNOSIS — Z86711 Personal history of pulmonary embolism: Secondary | ICD-10-CM

## 2015-04-25 DIAGNOSIS — K219 Gastro-esophageal reflux disease without esophagitis: Secondary | ICD-10-CM | POA: Diagnosis present

## 2015-04-25 DIAGNOSIS — N182 Chronic kidney disease, stage 2 (mild): Secondary | ICD-10-CM | POA: Diagnosis present

## 2015-04-25 DIAGNOSIS — I4510 Unspecified right bundle-branch block: Secondary | ICD-10-CM | POA: Diagnosis present

## 2015-04-25 DIAGNOSIS — Z9842 Cataract extraction status, left eye: Secondary | ICD-10-CM

## 2015-04-25 DIAGNOSIS — E039 Hypothyroidism, unspecified: Secondary | ICD-10-CM | POA: Diagnosis present

## 2015-04-25 LAB — URINALYSIS, ROUTINE W REFLEX MICROSCOPIC
BILIRUBIN URINE: NEGATIVE
Glucose, UA: NEGATIVE mg/dL
KETONES UR: NEGATIVE mg/dL
NITRITE: NEGATIVE
PROTEIN: 100 mg/dL — AB
SPECIFIC GRAVITY, URINE: 1.011 (ref 1.005–1.030)
UROBILINOGEN UA: 0.2 mg/dL (ref 0.0–1.0)
pH: 5.5 (ref 5.0–8.0)

## 2015-04-25 LAB — URINE MICROSCOPIC-ADD ON

## 2015-04-25 LAB — CBC WITH DIFFERENTIAL/PLATELET
BASOS ABS: 0 10*3/uL (ref 0.0–0.1)
Basophils Relative: 0 % (ref 0–1)
EOS PCT: 1 % (ref 0–5)
Eosinophils Absolute: 0.1 10*3/uL (ref 0.0–0.7)
HCT: 30.2 % — ABNORMAL LOW (ref 36.0–46.0)
Hemoglobin: 9.2 g/dL — ABNORMAL LOW (ref 12.0–15.0)
Lymphocytes Relative: 20 % (ref 12–46)
Lymphs Abs: 2.4 10*3/uL (ref 0.7–4.0)
MCH: 26.9 pg (ref 26.0–34.0)
MCHC: 30.5 g/dL (ref 30.0–36.0)
MCV: 88.3 fL (ref 78.0–100.0)
Monocytes Absolute: 0.6 10*3/uL (ref 0.1–1.0)
Monocytes Relative: 5 % (ref 3–12)
NEUTROS ABS: 8.8 10*3/uL — AB (ref 1.7–7.7)
NEUTROS PCT: 74 % (ref 43–77)
Platelets: 433 10*3/uL — ABNORMAL HIGH (ref 150–400)
RBC: 3.42 MIL/uL — ABNORMAL LOW (ref 3.87–5.11)
RDW: 16.6 % — AB (ref 11.5–15.5)
WBC: 11.8 10*3/uL — ABNORMAL HIGH (ref 4.0–10.5)

## 2015-04-25 LAB — BASIC METABOLIC PANEL
Anion gap: 14 (ref 5–15)
BUN: 21 mg/dL — AB (ref 6–20)
CHLORIDE: 106 mmol/L (ref 101–111)
CO2: 20 mmol/L — ABNORMAL LOW (ref 22–32)
CREATININE: 1.21 mg/dL — AB (ref 0.44–1.00)
Calcium: 9.7 mg/dL (ref 8.9–10.3)
GFR calc Af Amer: 48 mL/min — ABNORMAL LOW (ref >60)
GFR, EST NON AFRICAN AMERICAN: 41 mL/min — AB (ref >60)
Glucose, Bld: 159 mg/dL — ABNORMAL HIGH (ref 65–99)
Potassium: 4.2 mmol/L (ref 3.5–5.1)
Sodium: 140 mmol/L (ref 135–145)

## 2015-04-25 LAB — PROTIME-INR
INR: 1.27 (ref 0.00–1.49)
Prothrombin Time: 16 seconds — ABNORMAL HIGH (ref 11.6–15.2)

## 2015-04-25 LAB — TROPONIN I
TROPONIN I: 0.03 ng/mL (ref <0.031)
Troponin I: 0.03 ng/mL (ref <0.031)

## 2015-04-25 LAB — BRAIN NATRIURETIC PEPTIDE: B Natriuretic Peptide: 1336.1 pg/mL — ABNORMAL HIGH (ref 0.0–100.0)

## 2015-04-25 LAB — APTT: aPTT: 34 seconds (ref 24–37)

## 2015-04-25 LAB — I-STAT TROPONIN, ED: Troponin i, poc: 0.04 ng/mL (ref 0.00–0.08)

## 2015-04-25 MED ORDER — ISOSORBIDE MONONITRATE ER 30 MG PO TB24
30.0000 mg | ORAL_TABLET | Freq: Every day | ORAL | Status: DC
  Administered 2015-04-26 – 2015-04-28 (×3): 30 mg via ORAL
  Filled 2015-04-25 (×4): qty 1

## 2015-04-25 MED ORDER — SODIUM CHLORIDE 0.9 % IV SOLN: 250.0000 mL | INTRAVENOUS | Status: DC | PRN

## 2015-04-25 MED ORDER — FUROSEMIDE 10 MG/ML IJ SOLN
40.0000 mg | Freq: Two times a day (BID) | INTRAMUSCULAR | Status: DC
  Administered 2015-04-26 – 2015-04-28 (×5): 40 mg via INTRAVENOUS
  Filled 2015-04-25 (×8): qty 4

## 2015-04-25 MED ORDER — SODIUM CHLORIDE 0.9 % IJ SOLN: 3.0000 mL | INTRAMUSCULAR | Status: DC | PRN

## 2015-04-25 MED ORDER — ASPIRIN EC 81 MG PO TBEC: 81.0000 mg | DELAYED_RELEASE_TABLET | ORAL | Status: DC

## 2015-04-25 MED ORDER — ASPIRIN EC 81 MG PO TBEC
81.0000 mg | DELAYED_RELEASE_TABLET | Freq: Every day | ORAL | Status: DC
  Administered 2015-04-26 – 2015-04-28 (×3): 81 mg via ORAL
  Filled 2015-04-25 (×3): qty 1

## 2015-04-25 MED ORDER — ACETAMINOPHEN 325 MG PO TABS: 650.0000 mg | ORAL_TABLET | ORAL | Status: DC | PRN

## 2015-04-25 MED ORDER — HYDROXYZINE HCL 50 MG/ML IM SOLN
25.0000 mg | Freq: Four times a day (QID) | INTRAMUSCULAR | Status: DC | PRN
  Filled 2015-04-25: qty 0.5

## 2015-04-25 MED ORDER — ALBUTEROL SULFATE (2.5 MG/3ML) 0.083% IN NEBU: 2.5000 mg | INHALATION_SOLUTION | RESPIRATORY_TRACT | Status: DC | PRN

## 2015-04-25 MED ORDER — OMEGA-3-ACID ETHYL ESTERS 1 G PO CAPS
1.0000 g | ORAL_CAPSULE | Freq: Every day | ORAL | Status: DC
  Administered 2015-04-26 – 2015-04-27 (×2): 1 g via ORAL
  Filled 2015-04-25 (×3): qty 1

## 2015-04-25 MED ORDER — IPRATROPIUM-ALBUTEROL 0.5-2.5 (3) MG/3ML IN SOLN
3.0000 mL | Freq: Four times a day (QID) | RESPIRATORY_TRACT | Status: DC
  Administered 2015-04-26 – 2015-04-28 (×10): 3 mL via RESPIRATORY_TRACT
  Filled 2015-04-25 (×11): qty 3

## 2015-04-25 MED ORDER — DICLOFENAC SODIUM 1 % TD GEL
4.0000 g | Freq: Three times a day (TID) | TRANSDERMAL | Status: DC
  Administered 2015-04-26 – 2015-04-28 (×7): 4 g via TOPICAL
  Filled 2015-04-25: qty 100

## 2015-04-25 MED ORDER — OXYMETAZOLINE HCL 0.05 % NA SOLN
1.0000 | Freq: Two times a day (BID) | NASAL | Status: DC | PRN
  Filled 2015-04-25: qty 15

## 2015-04-25 MED ORDER — CAPSAICIN 0.075 % EX CREA
TOPICAL_CREAM | Freq: Two times a day (BID) | CUTANEOUS | Status: DC
  Administered 2015-04-26 – 2015-04-27 (×4): via TOPICAL
  Administered 2015-04-28: 1 via TOPICAL
  Filled 2015-04-25: qty 60

## 2015-04-25 MED ORDER — IPRATROPIUM BROMIDE 0.02 % IN SOLN
0.5000 mg | Freq: Once | RESPIRATORY_TRACT | Status: AC
  Administered 2015-04-25: 0.5 mg via RESPIRATORY_TRACT
  Filled 2015-04-25: qty 2.5

## 2015-04-25 MED ORDER — FUROSEMIDE 10 MG/ML IJ SOLN
40.0000 mg | Freq: Once | INTRAMUSCULAR | Status: AC
  Administered 2015-04-25: 40 mg via INTRAVENOUS
  Filled 2015-04-25: qty 4

## 2015-04-25 MED ORDER — OMEGA 3 1000 MG PO CAPS: 1000.0000 mg | ORAL_CAPSULE | Freq: Every day | ORAL | Status: DC

## 2015-04-25 MED ORDER — CARVEDILOL 6.25 MG PO TABS
6.2500 mg | ORAL_TABLET | Freq: Two times a day (BID) | ORAL | Status: DC
  Administered 2015-04-26 – 2015-04-28 (×5): 6.25 mg via ORAL
  Filled 2015-04-25 (×7): qty 1

## 2015-04-25 MED ORDER — FERROUS SULFATE 325 (65 FE) MG PO TABS
325.0000 mg | ORAL_TABLET | ORAL | Status: DC
Start: 2015-04-26 — End: 2015-04-28
  Administered 2015-04-26 – 2015-04-28 (×3): 325 mg via ORAL
  Filled 2015-04-25 (×4): qty 1

## 2015-04-25 MED ORDER — VITAMIN D3 25 MCG (1000 UNIT) PO TABS
1000.0000 [IU] | ORAL_TABLET | Freq: Every day | ORAL | Status: DC
  Administered 2015-04-26 – 2015-04-27 (×2): 1000 [IU] via ORAL
  Filled 2015-04-25 (×3): qty 1

## 2015-04-25 MED ORDER — FAMOTIDINE 20 MG PO TABS
20.0000 mg | ORAL_TABLET | Freq: Every day | ORAL | Status: DC
  Administered 2015-04-25 – 2015-04-27 (×3): 20 mg via ORAL
  Filled 2015-04-25 (×4): qty 1

## 2015-04-25 MED ORDER — SODIUM CHLORIDE 0.9 % IJ SOLN
3.0000 mL | Freq: Two times a day (BID) | INTRAMUSCULAR | Status: DC
  Administered 2015-04-25 – 2015-04-28 (×5): 3 mL via INTRAVENOUS

## 2015-04-25 MED ORDER — ATORVASTATIN CALCIUM 20 MG PO TABS
20.0000 mg | ORAL_TABLET | Freq: Every day | ORAL | Status: DC
  Administered 2015-04-26 – 2015-04-27 (×2): 20 mg via ORAL
  Filled 2015-04-25 (×3): qty 1

## 2015-04-25 MED ORDER — METHYLPREDNISOLONE SODIUM SUCC 125 MG IJ SOLR: 125.0000 mg | Freq: Once | INTRAMUSCULAR | Status: DC

## 2015-04-25 MED ORDER — ENSURE ENLIVE PO LIQD
237.0000 mL | Freq: Two times a day (BID) | ORAL | Status: DC
  Administered 2015-04-26 – 2015-04-28 (×5): 237 mL via ORAL

## 2015-04-25 MED ORDER — LEVOTHYROXINE SODIUM 50 MCG PO TABS
50.0000 ug | ORAL_TABLET | ORAL | Status: DC
  Administered 2015-04-26 – 2015-04-28 (×3): 50 ug via ORAL
  Filled 2015-04-25 (×4): qty 1

## 2015-04-25 MED ORDER — LISINOPRIL 5 MG PO TABS
5.0000 mg | ORAL_TABLET | Freq: Every day | ORAL | Status: DC
  Administered 2015-04-26 – 2015-04-28 (×3): 5 mg via ORAL
  Filled 2015-04-25 (×3): qty 1

## 2015-04-25 MED ORDER — HEPARIN SODIUM (PORCINE) 5000 UNIT/ML IJ SOLN
5000.0000 [IU] | Freq: Three times a day (TID) | INTRAMUSCULAR | Status: DC
  Administered 2015-04-26 – 2015-04-28 (×7): 5000 [IU] via SUBCUTANEOUS
  Filled 2015-04-25 (×10): qty 1

## 2015-04-25 MED ORDER — ACETAMINOPHEN 325 MG PO TABS
650.0000 mg | ORAL_TABLET | Freq: Four times a day (QID) | ORAL | Status: DC | PRN
  Administered 2015-04-27: 650 mg via ORAL
  Filled 2015-04-25: qty 2

## 2015-04-25 MED ORDER — ALBUTEROL SULFATE (2.5 MG/3ML) 0.083% IN NEBU
5.0000 mg | INHALATION_SOLUTION | Freq: Once | RESPIRATORY_TRACT | Status: AC
  Administered 2015-04-25: 5 mg via RESPIRATORY_TRACT
  Filled 2015-04-25: qty 6

## 2015-04-25 MED ORDER — FLUTICASONE PROPIONATE 50 MCG/ACT NA SUSP
1.0000 | Freq: Every day | NASAL | Status: DC
  Administered 2015-04-26 – 2015-04-28 (×3): 1 via NASAL
  Filled 2015-04-25: qty 16

## 2015-04-25 MED ORDER — MOMETASONE FURO-FORMOTEROL FUM 200-5 MCG/ACT IN AERO
2.0000 | INHALATION_SPRAY | Freq: Two times a day (BID) | RESPIRATORY_TRACT | Status: DC
  Administered 2015-04-26 – 2015-04-28 (×5): 2 via RESPIRATORY_TRACT
  Filled 2015-04-25: qty 8.8

## 2015-04-25 NOTE — ED Notes (Signed)
Dr. Nanavati at bedside 

## 2015-04-25 NOTE — ED Notes (Signed)
Respiratory called

## 2015-04-25 NOTE — ED Provider Notes (Signed)
CSN: [id number]     Arrival date & time 04/25/15  1826 History   First MD Initiated Contact with Patient 04/25/15 1828     Chief Complaint  Patient presents with  . Shortness of Breath     (Consider location/radiation/quality/duration/timing/severity/associated sxs/prior Treatment) HPI Comments: 81 year old female with history of hypertension, asthma / COPD, CHF, hypothyroidism, history of colon cancer status post surgery with colostomy, CKD comes in with cc of shortness of breath. Pt comes in diophoretic and short of breath. She reports that has dyspnea started 2 days ago and has progressed. She has no chest pain. No n/v/f/c. No new cough. Pt denies any CAD. She thinks her Asthma has flared up. She has taken few treatments at the nursing home with transient relief.   ROS 10 Systems reviewed and are negative for acute change except as noted in the HPI.     Patient is a 81 y.o. female presenting with shortness of breath. The history is provided by the patient.  Shortness of Breath Associated symptoms: wheezing   Associated symptoms: no fever     Past Medical History  Diagnosis Date  . Hypertension   . Asthma   . CHF (congestive heart failure)   . COPD (chronic obstructive pulmonary disease)   . Colon cancer   . Hyperlipidemia   . Heart murmur   . Anginal pain   . Pulmonary embolism   . Pneumonia     "just once" (03/01/2015)  . Hypothyroidism   . Diabetes mellitus without complication     pt denies this hx on 03/01/2015  . History of blood transfusion     "related to anemia, this is my 1st" (03/01/2015)  . Arthritis     "comes and goes" (03/01/2015)  . Renal disorder   . Colostomy care    Past Surgical History  Procedure Laterality Date  . Cholecystectomy    . Colon surgery      cancer  . Fracture surgery    . Dilation and curettage of uterus    . Tubal ligation    . Cataract extraction w/ intraocular lens  implant, bilateral Bilateral   . Ankle fracture surgery  Left   . Abdominal hysterectomy    . Cardiac catheterization N/A 03/22/2015    Procedure: Right/Left Heart Cath and Coronary Angiography;  Surgeon: Leonie Man, MD;  Location: [facility];  Service: Cardiovascular;  Laterality: N/A;   Family History  Problem Relation Age of Onset  . CAD    . Hypertension    . Stroke    . Colon cancer Neg Hx   . GI Bleed Neg Hx    History  Substance Use Topics  . Smoking status: Never Smoker   . Smokeless tobacco: Never Used  . Alcohol Use: No   OB History    No data available     Review of Systems  Constitutional: Negative for fever.  Respiratory: Positive for shortness of breath and wheezing.   All other systems reviewed and are negative.     Allergies  Review of patient's allergies indicates no known allergies.  Home Medications   Prior to Admission medications   Medication Sig Start Date End Date Taking? Authorizing Provider  acetaminophen (TYLENOL) 325 MG tablet Take 650 mg by mouth every 6 (six) hours as needed (pain).   Yes Historical Provider, MD  ADVAIR DISKUS 500-50 MCG/DOSE AEPB Inhale 1 puff into the lungs every 12 (twelve) hours. 12/28/14  Yes Historical Provider, MD  aspirin EC 81 MG tablet Take 1 tablet (81 mg total) by mouth every morning. 03/03/15  Yes Ripudeep K Rai, MD  atorvastatin (LIPITOR) 20 MG tablet Take 20 mg by mouth daily at 6 PM.    Yes Historical Provider, MD  capsicum (ZOSTRIX) 0.075 % topical cream Apply topically 2 (two) times daily. 03/02/15  Yes Ripudeep Krystal Eaton, MD  carvedilol (COREG) 6.25 MG tablet Take 1 tablet (6.25 mg total) by mouth 2 (two) times daily with a meal. 03/04/15  Yes Geradine Girt, DO  cholecalciferol (VITAMIN D) 1000 UNITS tablet Take 1,000 Units by mouth daily at 6 PM.   Yes Historical Provider, MD  diclofenac sodium (VOLTAREN) 1 % GEL Apply 4 g topically 3 (three) times daily.   Yes Historical Provider, MD  feeding supplement, ENSURE ENLIVE, (ENSURE ENLIVE) LIQD Take 237 mLs by  mouth 2 (two) times daily between meals. 03/02/15  Yes Ripudeep Krystal Eaton, MD  ferrous sulfate 325 (65 FE) MG tablet Take 325 mg by mouth every morning.   Yes Historical Provider, MD  fluticasone (FLONASE) 50 MCG/ACT nasal spray Place 1 spray into both nostrils daily.   Yes Historical Provider, MD  furosemide (LASIX) 40 MG tablet Take 1 tablet (40 mg total) by mouth daily. 03/24/15  Yes Nishant Dhungel, MD  isosorbide mononitrate (IMDUR) 30 MG 24 hr tablet Take 1 tablet (30 mg total) by mouth daily. 03/24/15  Yes Nishant Dhungel, MD  levothyroxine (SYNTHROID, LEVOTHROID) 50 MCG tablet Take 50 mcg by mouth every morning.  01/16/15  Yes Historical Provider, MD  lisinopril (PRINIVIL,ZESTRIL) 5 MG tablet Take 1 tablet (5 mg total) by mouth daily. 03/24/15  Yes Nishant Dhungel, MD  naproxen (NAPROSYN) 500 MG tablet Take 500 mg by mouth every 12 (twelve) hours as needed for mild pain. Give 1 hour before or after aspirin.   Yes Historical Provider, MD  Omega 3 1000 MG CAPS Take 1,000 mg by mouth daily at 6 PM.    Yes Historical Provider, MD  ranitidine (ZANTAC) 150 MG tablet Take 150 mg by mouth daily at 6 PM.  01/14/15  Yes Historical Provider, MD  oxymetazoline (AFRIN) 0.05 % nasal spray Place 1 spray into both nostrils 2 (two) times daily as needed (nose bleed). 03/02/15   Ripudeep K Rai, MD   BP 142/63 mmHg  Pulse 79  Temp(Src) 97.8 F (36.6 C) (Oral)  Resp 20  SpO2 100% Physical Exam  Constitutional: She is oriented to person, place, and time. She appears well-developed and well-nourished.  HENT:  Head: Normocephalic and atraumatic.  Eyes: EOM are normal. Pupils are equal, round, and reactive to light.  Neck: Neck supple.  Cardiovascular: Normal rate, regular rhythm and normal heart sounds.   No murmur heard. Pulmonary/Chest: She is in respiratory distress. She has wheezes. She has rales.  Increased RR with inability to complete full sentences and accessory muscle use.  Abdominal: Soft. She exhibits no  distension. There is no tenderness. There is no rebound and no guarding.  Musculoskeletal: She exhibits edema. She exhibits no tenderness.  Neurological: She is alert and oriented to person, place, and time.  Skin: Skin is warm. She is diaphoretic.  Nursing note and vitals reviewed.   ED Course  Procedures (including critical care time) Labs Review Labs Reviewed  CBC WITH DIFFERENTIAL/PLATELET - Abnormal; Notable for the following:    WBC 11.8 (*)    RBC 3.42 (*)    Hemoglobin 9.2 (*)    HCT 30.2 (*)  RDW 16.6 (*)    Platelets 433 (*)    Neutro Abs 8.8 (*)    All other components within normal limits  BASIC METABOLIC PANEL - Abnormal; Notable for the following:    CO2 20 (*)    Glucose, Bld 159 (*)    BUN 21 (*)    Creatinine, Ser 1.21 (*)    GFR calc non Af Amer 41 (*)    GFR calc Af Amer 48 (*)    All other components within normal limits  BRAIN NATRIURETIC PEPTIDE - Abnormal; Notable for the following:    B Natriuretic Peptide 1336.1 (*)    All other components within normal limits  PROTIME-INR - Abnormal; Notable for the following:    Prothrombin Time 16.0 (*)    All other components within normal limits  TROPONIN I  APTT  I-STAT TROPOININ, ED    Imaging Review Dg Chest Port 1 View  04/25/2015   CLINICAL DATA:  81 year old female with shortness of breath  EXAM: PORTABLE CHEST - 1 VIEW  COMPARISON:  CT dated 03/19/2015  FINDINGS: Single-view of the chest demonstrate hypovolemic but clear lungs. No significant pleural effusion. No pneumothorax. Cardiomegaly. Left pectoral pacemaker device. Degenerative changes of the spine.  IMPRESSION: Cardiomegaly.  No focal consolidation.   Electronically Signed   By: Anner Crete M.D.   On: 04/25/2015 19:07     EKG Interpretation   Date/Time:  Sunday April 25 2015 18:36:17 EDT Ventricular Rate:  95 PR Interval:  155 QRS Duration: 159 QT Interval:  408 QTC Calculation: 513 R Axis:   121 Text Interpretation:  Sinus  rhythm Nonspecific intraventricular conduction  delay Lateral infarct, acute Probable anteroseptal infarct, recent  worsening ST depression in inferior leads and v6  which are new Confirmed  by Kathrynn Humble, MD, Esteban Kobashigawa [phone]) on 04/25/2015 7:40:22 PM      EKG Interpretation  Date/Time:  Sunday April 25 2015 18:55:35 EDT Ventricular Rate:  95 PR Interval:  169 QRS Duration: 172 QT Interval:  429 QTC Calculation: 539 R Axis:   135 Text Interpretation:  Right sided ekg - v4-6 are actually v7-9 Sinus rhythm Right bundle branch block Diffuse depression - inferior and v7-v9 ST elevation in avL and lead 1 appear early repo Confirmed by Kathrynn Humble, MD, Kaelie Henigan [phone]) on 04/25/2015 7:56:53 PM       03/2015:  CATH REPORT is as below: 1. 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 Anomalous codominant RCA 3. Moderate to severely reduced EF by LV gram consistent with echocardiogram. Despite this the cardiac output/index is only mild to moderately reduced. (Low normal by Fick and mildly to moderately reduced by Thermodilution) 4. Moderate to severely elevated LVEDP with only mildly elevated pulmonary arterial capillary wedge pressure of 15 mmHg  No angiographic data for culprit lesion to explain the patient's reduced EF and acute exacerbation.   @9 :30pm Bipap removed. PT appears a lot more comfortable. She has no wheezing. Suspect CHF more than COPD - she is s/p 5 mg of albuterol.  We will give lasix. Medicine to admit.   MDM   Final diagnoses:  Shortness of breath  Acute on chronic combined systolic and diastolic congestive heart failure  Acute-on-chronic kidney injury    Pt comes in with cc of dyspnea. She came in diaphoretic with increased work of breathing, normal O2 sats. Had wheezing. Initial impression was to pulm edema, Asthma exacerbation. No Chest pain, but ACS concerns were also  there, initial EKG showed no acute changes.  There was some v6 depression - which can be seen with right sided acs - so we got posterior ekg - and with such diffuse depression - spoke with Cardiologist, Dr. Claiborne Billings, who thinks pt is in RBBB and not in STEMI.  She had a neg PE workup in June and it also seems that she had a neg MPI stress at that time. CXR shows no pulm edema, pleural effusion. Clinically, low concerns for pneumonia currently. She does have some crackles, and i do think that there is an acute on chronic CHF component here.  Will keep her on CPAP for now. Labs pending.      Varney Biles, MD 04/25/15 2136

## 2015-04-25 NOTE — ED Notes (Signed)
Paged STEMI MD to [phone]

## 2015-04-25 NOTE — ED Notes (Signed)
Pt removed from BiPAP.

## 2015-04-25 NOTE — ED Notes (Signed)
Respiratory tech at bedside putting pt on BiPAP.

## 2015-04-25 NOTE — ED Notes (Signed)
Pt placed on zoll pads 

## 2015-04-25 NOTE — ED Notes (Signed)
PER EMS: pt from [facility] and was sent here for SOB. Staff at the facility report they auscultated rales bilaterally and so they adm 1 neb txt and called EMS. Upon EMS arrival they also report rales all over, dyspnea, and diaphoresis. O2 sats 98% RA but EMS put her on 15L NRB for comfort. Pt arrives here A&Ox4, diaphoretic, and 99% RA, respirations 23. BP-200/100, HR-96, CBG-175.

## 2015-04-25 NOTE — H&P (Signed)
[facility] History and Physical  [name] VFI:[id number] DOB: [birth year] DOA: 04/25/2015  Referring physician: ED physician PCP: No primary care provider on file.  Specialists:   Chief Complaint: Shortness of breath  HPI: [name] is a 81 y.o. female with PMH of hypertension, hyperlipidemia, GERD, hypothyroidism, colon cancer (S/P of surgery, colostomy, ICD placement, COPD, systolic congestive heart failure (EF 35-40%), pulmonary embolism, CKD-II, who presents with shortness of breath.  Patient reports that in the past 2 days, she has been shortness breath, which has been progressively getting worse. She does not have cough, wheezing, chest pain, fever or chills. No tenderness over calf areas. Patient does not abdominal pain, diarrhea. She has a intermittent dysuria and burning on urination. Patient is on Lasix, therefore she cannot tell whether she has an increased urinary frequency. She hasmild leg edema, no unilateral weakness.  In ED, patient was found to have BNP 1336.1, WBC 11.8, temperature normal, stable renal function, INR 1.27, negative troponin, negative chest x-ray for infiltration. Patient is admitted to inpatient for further evaluation and treatment.  Where does patient live?   SNF   Can patient participate in ADLs? None  Review of Systems:   General: no fevers, chills, no changes in body weight, has poor appetite, has fatigue HEENT: no blurry vision, hearing changes or sore throat Pulm: has dyspnea, no coughing, wheezing CV: no chest pain, palpitations Abd: no nausea, vomiting, abdominal pain, diarrhea, constipation GU: has dysuria, burning on urination, no hematuria  Ext: has leg edema Neuro: no unilateral weakness, numbness, or tingling, no vision change or hearing loss Skin: no rash MSK: No muscle spasm, no deformity, no limitation of range of movement in spin Heme: No easy bruising.  Travel history: No recent long distant travel.  Allergy: No Known  Allergies  Past Medical History  Diagnosis Date  . Hypertension   . Asthma   . CHF (congestive heart failure)   . COPD (chronic obstructive pulmonary disease)   . Colon cancer   . Hyperlipidemia   . Heart murmur   . Anginal pain   . Pulmonary embolism   . Pneumonia     "just once" (03/01/2015)  . Hypothyroidism   . Diabetes mellitus without complication     pt denies this hx on 03/01/2015  . History of blood transfusion     "related to anemia, this is my 1st" (03/01/2015)  . Arthritis     "comes and goes" (03/01/2015)  . Renal disorder   . Colostomy care     Past Surgical History  Procedure Laterality Date  . Cholecystectomy    . Colon surgery      cancer  . Fracture surgery    . Dilation and curettage of uterus    . Tubal ligation    . Cataract extraction w/ intraocular lens  implant, bilateral Bilateral   . Ankle fracture surgery Left   . Abdominal hysterectomy    . Cardiac catheterization N/A 03/22/2015    Procedure: Right/Left Heart Cath and Coronary Angiography;  Surgeon: Leonie Man, MD;  Location: [facility];  Service: Cardiovascular;  Laterality: N/A;    Social History:  reports that she has never smoked. She has never used smokeless tobacco. She reports that she does not drink alcohol or use illicit drugs.  Family History:  Family History  Problem Relation Age of Onset  . CAD    . Hypertension    . Stroke    . Colon cancer Neg  Hx   . GI Bleed Neg Hx      Prior to Admission medications   Medication Sig Start Date End Date Taking? Authorizing Provider  acetaminophen (TYLENOL) 325 MG tablet Take 650 mg by mouth every 6 (six) hours as needed (pain).   Yes Historical Provider, MD  ADVAIR DISKUS 500-50 MCG/DOSE AEPB Inhale 1 puff into the lungs every 12 (twelve) hours. 12/28/14  Yes Historical Provider, MD  aspirin EC 81 MG tablet Take 1 tablet (81 mg total) by mouth every morning. 03/03/15  Yes Ripudeep K Rai, MD  atorvastatin (LIPITOR) 20 MG tablet  Take 20 mg by mouth daily at 6 PM.    Yes Historical Provider, MD  capsicum (ZOSTRIX) 0.075 % topical cream Apply topically 2 (two) times daily. 03/02/15  Yes Ripudeep Krystal Eaton, MD  carvedilol (COREG) 6.25 MG tablet Take 1 tablet (6.25 mg total) by mouth 2 (two) times daily with a meal. 03/04/15  Yes Geradine Girt, DO  cholecalciferol (VITAMIN D) 1000 UNITS tablet Take 1,000 Units by mouth daily at 6 PM.   Yes Historical Provider, MD  diclofenac sodium (VOLTAREN) 1 % GEL Apply 4 g topically 3 (three) times daily.   Yes Historical Provider, MD  feeding supplement, ENSURE ENLIVE, (ENSURE ENLIVE) LIQD Take 237 mLs by mouth 2 (two) times daily between meals. 03/02/15  Yes Ripudeep Krystal Eaton, MD  ferrous sulfate 325 (65 FE) MG tablet Take 325 mg by mouth every morning.   Yes Historical Provider, MD  fluticasone (FLONASE) 50 MCG/ACT nasal spray Place 1 spray into both nostrils daily.   Yes Historical Provider, MD  furosemide (LASIX) 40 MG tablet Take 1 tablet (40 mg total) by mouth daily. 03/24/15  Yes Nishant Dhungel, MD  isosorbide mononitrate (IMDUR) 30 MG 24 hr tablet Take 1 tablet (30 mg total) by mouth daily. 03/24/15  Yes Nishant Dhungel, MD  levothyroxine (SYNTHROID, LEVOTHROID) 50 MCG tablet Take 50 mcg by mouth every morning.  01/16/15  Yes Historical Provider, MD  lisinopril (PRINIVIL,ZESTRIL) 5 MG tablet Take 1 tablet (5 mg total) by mouth daily. 03/24/15  Yes Nishant Dhungel, MD  naproxen (NAPROSYN) 500 MG tablet Take 500 mg by mouth every 12 (twelve) hours as needed for mild pain. Give 1 hour before or after aspirin.   Yes Historical Provider, MD  Omega 3 1000 MG CAPS Take 1,000 mg by mouth daily at 6 PM.    Yes Historical Provider, MD  ranitidine (ZANTAC) 150 MG tablet Take 150 mg by mouth daily at 6 PM.  01/14/15  Yes Historical Provider, MD  oxymetazoline (AFRIN) 0.05 % nasal spray Place 1 spray into both nostrils 2 (two) times daily as needed (nose bleed). 03/02/15   Ripudeep Krystal Eaton, MD    Physical  Exam: Filed Vitals:   04/25/15 2045 04/25/15 2100 04/25/15 2115 04/25/15 2141  BP: 147/58 142/63 156/66   Pulse: 76 79 85 87  Temp:      TempSrc:      Resp: 17 20 24 18   SpO2: 100% 100% 92% 94%   General: Not in acute distress HEENT:       Eyes: PERRL, EOMI, no scleral icterus.       ENT: No discharge from the ears and nose, no pharynx injection, no tonsillar enlargement.        Neck: positive JVD, no bruit, no mass felt. Heme: No neck lymph node enlargement. Cardiac: S1/S2, RRR, No murmurs, No gallops or rubs. Pulm: Diffused rales bilaterally, No wheezing,  rhonchi or rubs. Abd: Soft, nondistended, nontender, no rebound pain, no organomegaly, BS present. Ext: 1+ pitting leg edema bilaterally. 2+DP/PT pulse bilaterally. Musculoskeletal: No joint deformities, No joint redness or warmth, no limitation of ROM in spin. Skin: No rashes.  Neuro: Alert, oriented X3, cranial nerves II-XII grossly intact, muscle strength 5/5 in all extremities, sensation to light touch intact.  Psych: Patient is not psychotic, no suicidal or hemocidal ideation.  Labs on Admission:  Basic Metabolic Panel:  Recent Labs Lab 04/25/15 1924  NA 140  K 4.2  CL 106  CO2 20*  GLUCOSE 159*  BUN 21*  CREATININE 1.21*  CALCIUM 9.7   Liver Function Tests: No results for input(s): AST, ALT, ALKPHOS, BILITOT, PROT, ALBUMIN in the last 168 hours. No results for input(s): LIPASE, AMYLASE in the last 168 hours. No results for input(s): AMMONIA in the last 168 hours. CBC:  Recent Labs Lab 04/25/15 1924  WBC 11.8*  NEUTROABS 8.8*  HGB 9.2*  HCT 30.2*  MCV 88.3  PLT 433*   Cardiac Enzymes:  Recent Labs Lab 04/25/15 1924  TROPONINI 0.03    BNP (last 3 results)  Recent Labs  03/18/15 0305 04/25/15 1924  BNP 1270.3* 1336.1*    ProBNP (last 3 results) No results for input(s): PROBNP in the last 8760 hours.  CBG: No results for input(s): GLUCAP in the last 168 hours.  Radiological Exams on  Admission: Dg Chest Port 1 View  04/25/2015   CLINICAL DATA:  81 year old female with shortness of breath  EXAM: PORTABLE CHEST - 1 VIEW  COMPARISON:  CT dated 03/19/2015  FINDINGS: Single-view of the chest demonstrate hypovolemic but clear lungs. No significant pleural effusion. No pneumothorax. Cardiomegaly. Left pectoral pacemaker device. Degenerative changes of the spine.  IMPRESSION: Cardiomegaly.  No focal consolidation.   Electronically Signed   By: Anner Crete M.D.   On: 04/25/2015 19:07    EKG: Independently reviewed.  Abnormal findings: RBBB, QTc=539. ED physician, Dr. Olene Craven asked cardiologist, Dr. Claiborne Billings to have reviewed EKG, who thinks pt is in RBBB and not in STEMI.  Assessment/Plan Principal Problem:   CHF exacerbation Active Problems:   HTN (hypertension)   Colostomy in place   Chronic renal insufficiency, stage II (mild)   Prolonged QT interval   Acute respiratory failure, unspecified whether with hypoxia or hypercapnia   Normal coronary arteries 03/22/15   Non-ischemic cardiomyopathy   ICD in place   GERD (gastroesophageal reflux disease)   Chronic systolic congestive heart failure   COPD (chronic obstructive pulmonary disease)   CHF exacerbation of chronic systolic congestive heart failure and acute on chronic respiratory failure: Patient's elevated BNP, leg edema, crackles and positive JVD are consistence with congestive heart failure exacerbation. No infiltration on chest x-ray. Patient does not have any chest pain or oxygen saturation desaturation, less likely to have PE.  -will admit to Telemetry bed. -will treat with IV lasix 40 mg bid -ASA , lisinopril, Coreg  -will cycle CE X3 -will get 2-D echo to evaluate EF -strict In/Out -Daily body weight. -heart diet  COPD: Patient does not have wheezing, rhonchi on auscultation. No cough or chest pain. No infiltration on chest x-ray. Less likely to have COPD access patient. -DuoNeb nebulizers, Dulera and prn  albuterol nebs  HTN: -continue Coreg, lisinopril -On IV Lasix  Hx of colon CA and Colostomy in place:  -no acute issues  Chronic renal insufficiency, stage II (mild): Baseline creatinine 1.0-1.2. Her creatinine is 1.21, which is close to  baseline. -Follow-up renal function. BMP  GERD: -Pepcid   DVT ppx: SQ Heparin      Code Status: Full code Family Communication: None at bed side. Disposition Plan: Admit to inpatient   Date of Service 04/25/2015    Ivor Costa [facility] Pager [phone]  If 7PM-7AM, please contact night-coverage [email or web address] Password [facility] 04/25/2015, 10:03 PM

## 2015-04-26 ENCOUNTER — Ambulatory Visit (HOSPITAL_COMMUNITY): Payer: Medicare HMO

## 2015-04-26 DIAGNOSIS — I10 Essential (primary) hypertension: Secondary | ICD-10-CM

## 2015-04-26 DIAGNOSIS — J9600 Acute respiratory failure, unspecified whether with hypoxia or hypercapnia: Secondary | ICD-10-CM

## 2015-04-26 DIAGNOSIS — I509 Heart failure, unspecified: Secondary | ICD-10-CM

## 2015-04-26 LAB — MAGNESIUM: MAGNESIUM: 1.6 mg/dL — AB (ref 1.7–2.4)

## 2015-04-26 LAB — TROPONIN I
Troponin I: 0.03 ng/mL (ref <0.031)
Troponin I: <0.03 ng/mL (ref <0.031)

## 2015-04-26 LAB — MRSA PCR SCREENING: MRSA BY PCR: NEGATIVE

## 2015-04-26 LAB — LIPID PANEL
Cholesterol: 125 mg/dL (ref 0–200)
HDL: 37 mg/dL — ABNORMAL LOW (ref >40)
LDL Cholesterol: 68 mg/dL (ref 0–99)
TRIGLYCERIDES: 102 mg/dL (ref <150)
Total CHOL/HDL Ratio: 3.4 RATIO
VLDL: 20 mg/dL (ref 0–40)

## 2015-04-26 LAB — BASIC METABOLIC PANEL
Anion gap: 12 (ref 5–15)
BUN: 23 mg/dL — ABNORMAL HIGH (ref 6–20)
CALCIUM: 9.7 mg/dL (ref 8.9–10.3)
CO2: 24 mmol/L (ref 22–32)
Chloride: 106 mmol/L (ref 101–111)
Creatinine, Ser: 0.96 mg/dL (ref 0.44–1.00)
GFR calc Af Amer: >60 mL/min (ref >60)
GFR calc non Af Amer: 55 mL/min — ABNORMAL LOW (ref >60)
Glucose, Bld: 78 mg/dL (ref 65–99)
POTASSIUM: 4 mmol/L (ref 3.5–5.1)
Sodium: 142 mmol/L (ref 135–145)

## 2015-04-26 MED ORDER — MAGNESIUM SULFATE IN D5W 10-5 MG/ML-% IV SOLN
1.0000 g | Freq: Once | INTRAVENOUS | Status: AC
  Administered 2015-04-26: 1 g via INTRAVENOUS
  Filled 2015-04-26: qty 100

## 2015-04-26 NOTE — Progress Notes (Signed)
Utilization Review Completed.Donne Anon T7/18/2016

## 2015-04-26 NOTE — Progress Notes (Signed)
PROGRESS NOTE  [name] OAC:[id number] DOB: 1934-09-02 DOA: 04/25/2015 PCP: No primary care provider on file.  Assessment/Plan: CHF exacerbation of chronic systolic congestive heart failure and acute on chronic respiratory failure:  -will admit to Telemetry bed. -will treat with IV lasix 40 mg bid -ASA , lisinopril, Coreg  -will cycle CE X3 -will get 2-D echo to evaluate EF -strict In/Out -Daily weight.   COPD: Patient does not have wheezing, rhonchi on auscultation. No cough or chest pain. No infiltration on chest x-ray. Less likely to have COPD access patient. -DuoNeb nebulizers, Dulera and prn albuterol nebs  HTN: -continue Coreg, lisinopril -On IV Lasix  Hx of colon CA and Colostomy in place:  -no acute issues  Chronic renal insufficiency, stage II (mild): Baseline creatinine 1.0-1.2. Her creatinine is 1.21, which is close to baseline. -Follow-up renal function. BMP  GERD: -Pepcid   Code Status: full Family Communication: patien Disposition Plan:    Consultants:    Procedures:      HPI/Subjective: Breathing better   Objective: Filed Vitals:   04/26/15 0946  BP: 147/83  Pulse:   Temp:   Resp:     Intake/Output Summary (Last 24 hours) at 04/26/15 1132 Last data filed at 04/26/15 0953  Gross per 24 hour  Intake    780 ml  Output    400 ml  Net    380 ml   Filed Weights   04/25/15 2233 04/26/15 0601  Weight: 82.918 kg (182 lb 12.8 oz) 81.92 kg (180 lb 9.6 oz)    Exam:   General:  A+Ox3, on O2, mild increase in work of breathing  Cardiovascular: rrr  Respiratory: crackles at bases  Abdomen: +BS, soft  Musculoskeletal: + edema b/l   Data Reviewed: Basic Metabolic Panel:  Recent Labs Lab 04/25/15 1924 04/26/15 0220  NA 140 142  K 4.2 4.0  CL 106 106  CO2 20* 24  GLUCOSE 159* 78  BUN 21* 23*  CREATININE 1.21* 0.96  CALCIUM 9.7 9.7  MG  --  1.6*   Liver Function Tests: No results for input(s): AST, ALT, ALKPHOS,  BILITOT, PROT, ALBUMIN in the last 168 hours. No results for input(s): LIPASE, AMYLASE in the last 168 hours. No results for input(s): AMMONIA in the last 168 hours. CBC:  Recent Labs Lab 04/25/15 1924  WBC 11.8*  NEUTROABS 8.8*  HGB 9.2*  HCT 30.2*  MCV 88.3  PLT 433*   Cardiac Enzymes:  Recent Labs Lab 04/25/15 1924 04/25/15 2253 04/26/15 0432  TROPONINI 0.03 0.03 0.03   BNP (last 3 results)  Recent Labs  03/18/15 0305 04/25/15 1924  BNP 1270.3* 1336.1*    ProBNP (last 3 results) No results for input(s): PROBNP in the last 8760 hours.  CBG: No results for input(s): GLUCAP in the last 168 hours.  Recent Results (from the past 240 hour(s))  MRSA PCR Screening     Status: None   Collection Time: 04/25/15 11:40 PM  Result Value Ref Range Status   MRSA by PCR NEGATIVE NEGATIVE Final    Comment:        The GeneXpert MRSA Assay (FDA approved for NASAL specimens only), is one component of a comprehensive MRSA colonization surveillance program. It is not intended to diagnose MRSA infection nor to guide or monitor treatment for MRSA infections.      Studies: Dg Chest Port 1 View  04/25/2015   CLINICAL DATA:  81 year old female with shortness of breath  EXAM: PORTABLE CHEST -  1 VIEW  COMPARISON:  CT dated 03/19/2015  FINDINGS: Single-view of the chest demonstrate hypovolemic but clear lungs. No significant pleural effusion. No pneumothorax. Cardiomegaly. Left pectoral pacemaker device. Degenerative changes of the spine.  IMPRESSION: Cardiomegaly.  No focal consolidation.   Electronically Signed   By: Anner Crete M.D.   On: 04/25/2015 19:07    Scheduled Meds: . aspirin EC  81 mg Oral Daily  . atorvastatin  20 mg Oral q1800  . capsicum   Topical BID  . carvedilol  6.25 mg Oral BID WC  . cholecalciferol  1,000 Units Oral q1800  . diclofenac sodium  4 g Topical TID  . famotidine  20 mg Oral QHS  . feeding supplement (ENSURE ENLIVE)  237 mL Oral BID BM  .  ferrous sulfate  325 mg Oral BH-q7a  . fluticasone  1 spray Each Nare Daily  . furosemide  40 mg Intravenous BID  . heparin  5,000 Units Subcutaneous 3 times per day  . ipratropium-albuterol  3 mL Nebulization Q6H  . isosorbide mononitrate  30 mg Oral Daily  . levothyroxine  50 mcg Oral BH-q7a  . lisinopril  5 mg Oral Daily  . mometasone-formoterol  2 puff Inhalation BID  . omega-3 acid ethyl esters  1 g Oral q1800  . sodium chloride  3 mL Intravenous Q12H   Continuous Infusions:  Antibiotics Given (last 72 hours)    None      Principal Problem:   CHF exacerbation Active Problems:   HTN (hypertension)   Colostomy in place   Chronic renal insufficiency, stage II (mild)   Prolonged QT interval   Acute respiratory failure, unspecified whether with hypoxia or hypercapnia   Normal coronary arteries 03/22/15   Non-ischemic cardiomyopathy   ICD in place   GERD (gastroesophageal reflux disease)   Chronic systolic congestive heart failure   COPD (chronic obstructive pulmonary disease)    Time spent: 25 min    Azarria Balint  [facility] Pager [phone] If 7PM-7AM, please contact night-coverage at [email or web address], password [facility] 04/26/2015, 11:32 AM  LOS: 1 day

## 2015-04-26 NOTE — Progress Notes (Signed)
The patient arrived to 3E22 from the ED at 2240.  She was oriented to the unit and placed on telemetry.  CCMD was notified.  She is A&OX4 and her VSS.  She is on RA at this time and is refusing O2 for comfort.  She does have some dyspnea on exertion, but appears unlabored while laying in bed.  She is incontinent and is an assistx1.  The patient spoke with her son via telephone.  A MRSA PCR swab was done and sent.  Her call bell was explained and placed within reach.  Her bed alarm was turned on.

## 2015-04-26 NOTE — Progress Notes (Signed)
CSW spoke with Dr. Eliseo Squires about this 81 year old female this afternoon. She is currently a patient of [facility] and was placed at this facility about 1 month ago.  CSW contacted Harriet Pho,  RN Liaison for [facility]. She stated that patient was scheduled to be transferred to [place] today but had to be admitted to the hospital on 04/25/15.  Dr. Eliseo Squires has ordered PT to determine if patient will be able to go to lower level of care- ALF. She has [employer] Medicare (non-[employer]) which will require pre- auth if patient requires return to SNF.  Fl2 initiated and placed in [facility]. Will await PT's recommendation and SW Psychsocial Assessment to follow tomorrow.  Lorie Phenix. Pauline Good, [place]

## 2015-04-27 ENCOUNTER — Ambulatory Visit (HOSPITAL_COMMUNITY): Payer: Medicare HMO

## 2015-04-27 LAB — BASIC METABOLIC PANEL
ANION GAP: 10 (ref 5–15)
BUN: 20 mg/dL (ref 6–20)
CO2: 28 mmol/L (ref 22–32)
CREATININE: 0.97 mg/dL (ref 0.44–1.00)
Calcium: 9.6 mg/dL (ref 8.9–10.3)
Chloride: 102 mmol/L (ref 101–111)
GFR calc Af Amer: >60 mL/min (ref >60)
GFR, EST NON AFRICAN AMERICAN: 54 mL/min — AB (ref >60)
Glucose, Bld: 68 mg/dL (ref 65–99)
POTASSIUM: 3.9 mmol/L (ref 3.5–5.1)
Sodium: 140 mmol/L (ref 135–145)

## 2015-04-27 LAB — URINE CULTURE: Culture: 9000

## 2015-04-27 LAB — IRON AND TIBC
Iron: 48 ug/dL (ref 28–170)
SATURATION RATIOS: 19 % (ref 10.4–31.8)
TIBC: 253 ug/dL (ref 250–450)
UIBC: 205 ug/dL

## 2015-04-27 LAB — CBC
HEMATOCRIT: 27.3 % — AB (ref 36.0–46.0)
Hemoglobin: 8.4 g/dL — ABNORMAL LOW (ref 12.0–15.0)
MCH: 27 pg (ref 26.0–34.0)
MCHC: 30.8 g/dL (ref 30.0–36.0)
MCV: 87.8 fL (ref 78.0–100.0)
Platelets: 383 10*3/uL (ref 150–400)
RBC: 3.11 MIL/uL — ABNORMAL LOW (ref 3.87–5.11)
RDW: 16.8 % — AB (ref 11.5–15.5)
WBC: 6.5 10*3/uL (ref 4.0–10.5)

## 2015-04-27 LAB — HEMOGLOBIN A1C
Hgb A1c MFr Bld: 6 % — ABNORMAL HIGH (ref 4.8–5.6)
MEAN PLASMA GLUCOSE: 126 mg/dL

## 2015-04-27 LAB — VITAMIN B12: VITAMIN B 12: 268 pg/mL (ref 180–914)

## 2015-04-27 LAB — FERRITIN: Ferritin: 276 ng/mL (ref 11–307)

## 2015-04-27 NOTE — Clinical Social Work Note (Signed)
Clinical Social Work Assessment  Patient Details  Name: [name] MRN: [id number] Date of Birth: 06-02-1934  Date of referral:  04/27/15               Reason for consult:  Facility Placement                Permission sought to share information with:  Facility Sport and exercise psychologist, Family Supports Permission granted to share information::  Yes, Verbal Permission Granted  Name::     [name] (son)  Agency::  [facility]  Relationship::     Contact Information:     Housing/Transportation Living arrangements for the past 2 months:  [place] of Information:  Patient, Adult Children, Facility Patient Interpreter Needed:  None Criminal Activity/Legal Involvement Pertinent to Current Situation/Hospitalization:  No - Comment as needed Significant Relationships:  Adult Children Lives with:  Facility Resident Do you feel safe going back to the place where you live?  Yes Need for family participation in patient care:  No (Coment) (Pt requesting her son be included in conversation)  Care giving concerns:  Pt admitted from SNF but already in the process of transitioning to ALF   Social Worker assessment / plan:  CSW spoke with [facility] and was informed that pt was not supposed to admit to facility until 05/03/15 and they will not be able to admit pt sooner. CSW spoke with [facility] and notified of above information. They are agreeable to pt returning until she is appropriately able to admit to ALF. Facility working on billing/insurance for pt to return. CSW spoke with pt and pt son at bedside and notified of this information. Pt expressed a little disappointment in not getting to move ahead to ALF but understanding of the situation. Pt and pt son both agreeable with plan for pt to dc back to SNF and continuing to work on transition to ALF from there when appropriate.   Employment status:  Retired Designer, industrial/product PT Recommendations:    Information / Referral to community resources:  Other (Comment Required) (None need at this time--pt already aware of needed resources)  Patient/Family's Response to care:  Pt and pt son agreeable to plan for dc back to SNF  Patient/Family's Understanding of and Emotional Response to Diagnosis, Current Treatment, and Prognosis:  Pt son has a good understanding of pt condition. Pt with limited understanding and defers to her son for majority of discussions. Pt and pt son with appropriate emotional response. Emotional Assessment Appearance:  Appears stated age, Well-Groomed Attitude/Demeanor/Rapport:  Other (Cooperative) Affect (typically observed):  Accepting, Calm, Pleasant Orientation:  Oriented to Self, Oriented to Place, Oriented to  Time, Oriented to Situation Alcohol / Substance use:  Not Applicable Psych involvement (Current and /or in the community):  No (Comment)  Discharge Needs  Concerns to be addressed:  Discharge Planning Concerns Readmission within the last 30 days:  No Current discharge risk:  Dependent with Mobility Barriers to Discharge:  Continued Medical Work up   [employer], [place]

## 2015-04-27 NOTE — Care Management Important Message (Signed)
Important Message  Patient Details  Name: [name] MRN: [id number] Date of Birth: 14-Jun-1934   Medicare Important Message Given:  Yes-second notification given    Pricilla Handler 04/27/2015, 9:56 AM

## 2015-04-27 NOTE — Evaluation (Signed)
Physical Therapy Evaluation Patient Details Name: [name] MRN: [id number] DOB: July 14, 1934 Today's Date: 04/27/2015   History of Present Illness  Adm for acute CHF; PMHx- CAD, CHF,dermatomyositis, HTN, COPD, colostomy  Clinical Impression  Pt very pleasant and eager to move and go to ALF. Pt was living at home alone prior to last hospitalization and SNF who needs assist for toileting secondary to posterior lean in standing when not stabilizing with bil UE. Pt incontinent of urine but able to void at [facility] and use pad for gait to increase mobility. Pt will benefit from acute therapy to maximize mobility, function and gait to decrease fall risk and increase independence.     Follow Up Recommendations Home health PT    Equipment Recommendations  None recommended by PT    Recommendations for Other Services       Precautions / Restrictions Precautions Precautions: Fall Precaution Comments: incontinent      Mobility  Bed Mobility Overal bed mobility: Needs Assistance Bed Mobility: Supine to Sit     Supine to sit: Supervision     General bed mobility comments: cues for sequence and increased time  Transfers Overall transfer level: Needs assistance   Transfers: Sit to/from Stand;Stand Pivot Transfers Sit to Stand: Min guard Stand pivot transfers: Min assist       General transfer comment: cues for hand placement and safety  Ambulation/Gait Ambulation/Gait assistance: Min guard Ambulation Distance (Feet): 90 Feet Assistive device: Rolling walker (2 wheeled) Gait Pattern/deviations: Step-through pattern;Decreased stride length;Trunk flexed   Gait velocity interpretation: Below normal speed for age/gender General Gait Details: cues for position in RW and to look up with gait  Stairs            Wheelchair Mobility    Modified Rankin (Stroke Patients Only)       Balance Overall balance assessment: Needs assistance   Sitting balance-Leahy Scale: Good       Standing balance-Leahy Scale: Poor                               Pertinent Vitals/Pain Pain Assessment: No/denies pain  HR 92 sats 97% RA    Home Living Family/patient expects to be discharged to:: Assisted living               Home Equipment: Walker - 2 wheels      Prior Function Level of Independence: Needs assistance   Gait / Transfers Assistance Needed: States she ambulates with assist and RW  ADL's / Homemaking Assistance Needed: Needs assist to bath/dress since being at SNF 1 month states she was independent prior to that        Hand Dominance        Extremity/Trunk Assessment   Upper Extremity Assessment: Generalized weakness           Lower Extremity Assessment: Generalized weakness      Cervical / Trunk Assessment: Kyphotic  Communication   Communication: No difficulties  Cognition Arousal/Alertness: Awake/alert Behavior During Therapy: WFL for tasks assessed/performed Overall Cognitive Status: Within Functional Limits for tasks assessed                      General Comments      Exercises        Assessment/Plan    PT Assessment Patient needs continued PT services  PT Diagnosis Difficulty walking;Generalized weakness   PT Problem List Decreased strength;Decreased activity tolerance;Decreased balance;Decreased  mobility;Decreased knowledge of use of DME  PT Treatment Interventions Gait training;Functional mobility training;Therapeutic activities;Therapeutic exercise;Balance training;Patient/family education;DME instruction   PT Goals (Current goals can be found in the Care Plan section) Acute Rehab PT Goals Patient Stated Goal: go to ALF PT Goal Formulation: With patient Time For Goal Achievement: 05/11/15 Potential to Achieve Goals: Fair    Frequency Min 3X/week   Barriers to discharge Decreased caregiver support      Co-evaluation               End of Session Equipment Utilized During  Treatment: Gait belt Activity Tolerance: Patient tolerated treatment well Patient left: in chair;with call bell/phone within reach;with chair alarm set Nurse Communication: Mobility status         Time: 6834-1962 PT Time Calculation (min) (ACUTE ONLY): 25 min   Charges:   PT Evaluation $Initial PT Evaluation Tier I: 1 Procedure PT Treatments $Gait Training: 8-22 mins   PT G CodesMelford Aase 04/27/2015, 12:39 PM Elwyn Reach, [place]

## 2015-04-27 NOTE — Progress Notes (Signed)
PROGRESS NOTE  CLEVIE [name]:[id number] DOB: [birth year] DOA: 04/25/2015 PCP: No primary care provider on file.  Assessment/Plan: CHF exacerbation of chronic systolic congestive heart failure and acute on chronic respiratory failure:  -IV lasix 40 mg bid -ASA , lisinopril, Coreg  -will cycle CE X3 -recent echo shows: ejection fraction was in the range of 35% to 40% -strict In/Out -Daily weight- down 7 lbs   COPD: Patient does not have wheezing, rhonchi on auscultation. No cough or chest pain. No infiltration on chest x-ray. Less likely to have COPD -DuoNeb nebulizers, Dulera and prn albuterol nebs  HTN: -continue Coreg, lisinopril -On IV Lasix  Hx of colon CA and Colostomy in place:  -no acute issues  Chronic renal insufficiency, stage II (mild): Baseline creatinine 1.0-1.2. Her creatinine is 1.21, which is close to baseline. -Follow-up renal function. BMP  GERD: -Pepcid   Code Status: full Family Communication: patient Disposition Plan: to ALF once diuresed   Consultants:  PT  Procedures:      HPI/Subjective: Breathing better   Objective: Filed Vitals:   04/27/15 1046  BP: 155/79  Pulse: 82  Temp: 98.2 F (36.8 C)  Resp: 17    Intake/Output Summary (Last 24 hours) at 04/27/15 1244 Last data filed at 04/27/15 0853  Gross per 24 hour  Intake    960 ml  Output      0 ml  Net    960 ml   Filed Weights   04/25/15 2233 04/26/15 0601 04/27/15 0523  Weight: 82.918 kg (182 lb 12.8 oz) 81.92 kg (180 lb 9.6 oz) 80.65 kg (177 lb 12.8 oz)    Exam:   General:  A+Ox3, on O2, NAD  Cardiovascular: rrr  Respiratory: crackles at bases  Abdomen: +BS, soft  Musculoskeletal: + edema b/l   Data Reviewed: Basic Metabolic Panel:  Recent Labs Lab 04/25/15 1924 04/26/15 0220 04/27/15 0344  NA 140 142 140  K 4.2 4.0 3.9  CL 106 106 102  CO2 20* 24 28  GLUCOSE 159* 78 68  BUN 21* 23* 20  CREATININE 1.21* 0.96 0.97  CALCIUM 9.7 9.7 9.6  MG   --  1.6*  --    Liver Function Tests: No results for input(s): AST, ALT, ALKPHOS, BILITOT, PROT, ALBUMIN in the last 168 hours. No results for input(s): LIPASE, AMYLASE in the last 168 hours. No results for input(s): AMMONIA in the last 168 hours. CBC:  Recent Labs Lab 04/25/15 1924 04/27/15 0344  WBC 11.8* 6.5  NEUTROABS 8.8*  --   HGB 9.2* 8.4*  HCT 30.2* 27.3*  MCV 88.3 87.8  PLT 433* 383   Cardiac Enzymes:  Recent Labs Lab 04/25/15 1924 04/25/15 2253 04/26/15 0432 04/26/15 1100  TROPONINI 0.03 0.03 0.03 <0.03   BNP (last 3 results)  Recent Labs  03/18/15 0305 04/25/15 1924  BNP 1270.3* 1336.1*    ProBNP (last 3 results) No results for input(s): PROBNP in the last 8760 hours.  CBG: No results for input(s): GLUCAP in the last 168 hours.  Recent Results (from the past 240 hour(s))  MRSA PCR Screening     Status: None   Collection Time: 04/25/15 11:40 PM  Result Value Ref Range Status   MRSA by PCR NEGATIVE NEGATIVE Final    Comment:        The GeneXpert MRSA Assay (FDA approved for NASAL specimens only), is one component of a comprehensive MRSA colonization surveillance program. It is not intended to diagnose MRSA infection nor to  guide or monitor treatment for MRSA infections.   Urine culture     Status: None   Collection Time: 04/26/15 12:18 AM  Result Value Ref Range Status   Specimen Description URINE, CLEAN CATCH  Final   Special Requests NONE  Final   Culture 9,000 COLONIES/mL INSIGNIFICANT GROWTH  Final   Report Status 04/27/2015 FINAL  Final     Studies: Dg Chest Port 1 View  04/25/2015   CLINICAL DATA:  81 year old female with shortness of breath  EXAM: PORTABLE CHEST - 1 VIEW  COMPARISON:  CT dated 03/19/2015  FINDINGS: Single-view of the chest demonstrate hypovolemic but clear lungs. No significant pleural effusion. No pneumothorax. Cardiomegaly. Left pectoral pacemaker device. Degenerative changes of the spine.  IMPRESSION:  Cardiomegaly.  No focal consolidation.   Electronically Signed   By: Anner Crete M.D.   On: 04/25/2015 19:07    Scheduled Meds: . aspirin EC  81 mg Oral Daily  . atorvastatin  20 mg Oral q1800  . capsicum   Topical BID  . carvedilol  6.25 mg Oral BID WC  . cholecalciferol  1,000 Units Oral q1800  . diclofenac sodium  4 g Topical TID  . famotidine  20 mg Oral QHS  . feeding supplement (ENSURE ENLIVE)  237 mL Oral BID BM  . ferrous sulfate  325 mg Oral BH-q7a  . fluticasone  1 spray Each Nare Daily  . furosemide  40 mg Intravenous BID  . heparin  5,000 Units Subcutaneous 3 times per day  . ipratropium-albuterol  3 mL Nebulization Q6H  . isosorbide mononitrate  30 mg Oral Daily  . levothyroxine  50 mcg Oral BH-q7a  . lisinopril  5 mg Oral Daily  . mometasone-formoterol  2 puff Inhalation BID  . omega-3 acid ethyl esters  1 g Oral q1800  . sodium chloride  3 mL Intravenous Q12H   Continuous Infusions:  Antibiotics Given (last 72 hours)    None      Principal Problem:   CHF exacerbation Active Problems:   HTN (hypertension)   Colostomy in place   Chronic renal insufficiency, stage II (mild)   Prolonged QT interval   Acute respiratory failure, unspecified whether with hypoxia or hypercapnia   Normal coronary arteries 03/22/15   Non-ischemic cardiomyopathy   ICD in place   GERD (gastroesophageal reflux disease)   Chronic systolic congestive heart failure   COPD (chronic obstructive pulmonary disease)    Time spent: 25 min    Keelia Graybill  [facility] Pager [phone] If 7PM-7AM, please contact night-coverage at [email or web address], password [facility] 04/27/2015, 12:44 PM  LOS: 2 days

## 2015-04-27 NOTE — Evaluation (Signed)
Occupational Therapy Evaluation Patient Details Name: [name] MRN: [id number] DOB: 1934/02/08 Today's Date: 04/27/2015    History of Present Illness Adm for acute CHF; PMHx- CAD, CHF,dermatomyositis, HTN, COPD, colostomy   Clinical Impression   Pt up in chair upon OTs arrival and grateful to be OOB.  Pt concerned that her bed at ALF will not be held for her while she is hospitalized.  Pt presents with generalized weakness, impaired balance and decreased activity tolerance interfering with ability to perform self care and ADL transfers.  Requires assist for standing ADL and LB bathing and dressing due to edema and UE dependence on walker for standing balance. Will follow acutely.   Follow Up Recommendations  Home health OT;Supervision/Assistance - 24 hour (at ALF)    Equipment Recommendations       Recommendations for Other Services       Precautions / Restrictions Precautions Precautions: Fall Precaution Comments: incontinent of urine, colostomy      Mobility Bed Mobility Overal bed mobility: Needs Assistance Bed Mobility: Supine to Sit     Supine to sit: Supervision     General bed mobility comments: pt in chair, returned to chair  Transfers Overall transfer level: Needs assistance Equipment used: Rolling walker (2 wheeled) Transfers: Sit to/from Stand Sit to Stand: Min guard Stand pivot transfers: Min assist       General transfer comment: cues for hand placement and safety    Balance Overall balance assessment: Needs assistance   Sitting balance-Leahy Scale: Good       Standing balance-Leahy Scale: Poor                              ADL Overall ADL's : Needs assistance/impaired Eating/Feeding: Set up;Sitting Eating/Feeding Details (indicate cue type and reason): set up due to pt having hot pack on her L hand to prepare for blood draw Grooming: Wash/dry hands;Min guard;Standing   Upper Body Bathing: Set up;Sitting   Lower Body  Bathing: Sit to/from stand;Moderate assistance   Upper Body Dressing : Set up;Sitting   Lower Body Dressing: Moderate assistance;Sit to/from stand   Toilet Transfer: Min guard;Stand-pivot;RW;BSC   Toileting- Water quality scientist and Hygiene: Moderate assistance;Sit to/from stand         General ADL Comments: Pt reports she has had longstanding trouble with LB dressing and was performing laying in the bed prior to her SNF admission      Vision     Perception     Praxis      Pertinent Vitals/Pain Pain Assessment: No/denies pain     Hand Dominance Right   Extremity/Trunk Assessment Upper Extremity Assessment Upper Extremity Assessment: Overall WFL for tasks assessed   Lower Extremity Assessment Lower Extremity Assessment: Defer to PT evaluation   Cervical / Trunk Assessment Cervical / Trunk Assessment: Kyphotic   Communication Communication Communication: No difficulties   Cognition Arousal/Alertness: Awake/alert Behavior During Therapy: WFL for tasks assessed/performed Overall Cognitive Status: Within Functional Limits for tasks assessed                     General Comments       Exercises       Shoulder Instructions      Home Living Family/patient expects to be discharged to:: Assisted living  Home Equipment: Walker - 2 wheels   Additional Comments: pt anxious she will lose her bed at ALF while hospitalized      Prior Functioning/Environment Level of Independence: Needs assistance  Gait / Transfers Assistance Needed: States she ambulates with assist and RW ADL's / Homemaking Assistance Needed: assisted for LB bathing and dressing and for toileting, could perform grooming, self feeding        OT Diagnosis: Generalized weakness   OT Problem List: Decreased strength;Decreased activity tolerance;Impaired balance (sitting and/or standing);Decreased knowledge of use of DME or AE;Increased edema   OT  Treatment/Interventions: Self-care/ADL training;DME and/or AE instruction;Balance training;Patient/family education    OT Goals(Current goals can be found in the care plan section) Acute Rehab OT Goals Patient Stated Goal: go to ALF OT Goal Formulation: With patient Time For Goal Achievement: 05/04/15 Potential to Achieve Goals: Good ADL Goals Pt Will Perform Grooming: with supervision;standing Pt Will Transfer to Toilet: with supervision;ambulating;bedside commode (over toilet) Pt Will Perform Toileting - Clothing Manipulation and hygiene: with supervision;sit to/from stand  OT Frequency: Min 2X/week   Barriers to D/C:            Co-evaluation              End of Session Equipment Utilized During Treatment: Gait belt;Rolling walker  Activity Tolerance: Patient tolerated treatment well (02 sats 96% on RA) Patient left: in chair;with call bell/phone within reach;with chair alarm set   Time: 6568-1275 OT Time Calculation (min): 33 min Charges:  OT General Charges $OT Visit: 1 Procedure OT Evaluation $Initial OT Evaluation Tier I: 1 Procedure OT Treatments $Self Care/Home Management : 8-22 mins G-Codes:    Malka So 04/27/2015, 1:32 PM  [phone]

## 2015-04-28 DIAGNOSIS — Z933 Colostomy status: Secondary | ICD-10-CM

## 2015-04-28 LAB — BASIC METABOLIC PANEL
Anion gap: 12 (ref 5–15)
BUN: 27 mg/dL — AB (ref 6–20)
CHLORIDE: 103 mmol/L (ref 101–111)
CO2: 26 mmol/L (ref 22–32)
Calcium: 9.7 mg/dL (ref 8.9–10.3)
Creatinine, Ser: 1.13 mg/dL — ABNORMAL HIGH (ref 0.44–1.00)
GFR calc Af Amer: 52 mL/min — ABNORMAL LOW (ref >60)
GFR, EST NON AFRICAN AMERICAN: 45 mL/min — AB (ref >60)
GLUCOSE: 114 mg/dL — AB (ref 65–99)
POTASSIUM: 4.5 mmol/L (ref 3.5–5.1)
Sodium: 141 mmol/L (ref 135–145)

## 2015-04-28 LAB — FOLATE RBC
FOLATE, RBC: 1161 ng/mL (ref >498)
Folate, Hemolysate: 348.3 ng/mL
Hematocrit: 30 % — ABNORMAL LOW (ref 34.0–46.6)

## 2015-04-28 MED ORDER — FUROSEMIDE 40 MG PO TABS: 40.0000 mg | ORAL_TABLET | Freq: Two times a day (BID) | ORAL | Status: DC

## 2015-04-28 MED ORDER — FUROSEMIDE 40 MG PO TABS: 40.0000 mg | ORAL_TABLET | Freq: Every day | ORAL | Status: DC

## 2015-04-28 MED ORDER — VITAMIN B-12 1000 MCG PO TABS
1000.0000 ug | ORAL_TABLET | Freq: Every day | ORAL | Status: DC
  Administered 2015-04-28: 1000 ug via ORAL
  Filled 2015-04-28: qty 1

## 2015-04-28 MED ORDER — TUBERCULIN PPD 5 UNIT/0.1ML ID SOLN: 5.0000 [IU] | Freq: Once | INTRADERMAL | Status: DC

## 2015-04-28 MED ORDER — TUBERCULIN PPD 5 UNIT/0.1ML ID SOLN
5.0000 [IU] | Freq: Once | INTRADERMAL | Status: DC
  Administered 2015-04-28: 5 [IU] via INTRADERMAL
  Filled 2015-04-28 (×3): qty 0.1

## 2015-04-28 MED ORDER — ALBUTEROL SULFATE (2.5 MG/3ML) 0.083% IN NEBU: 2.5000 mg | INHALATION_SOLUTION | RESPIRATORY_TRACT | Status: DC | PRN

## 2015-04-28 MED ORDER — CYANOCOBALAMIN 1000 MCG PO TABS: 1000.0000 ug | ORAL_TABLET | Freq: Every day | ORAL | Status: DC

## 2015-04-28 NOTE — Progress Notes (Signed)
Ok per Dr. Eliseo Squires for d/c today back to [facility]. CSW spoke to [place]- Biochemist, clinical of [facility]. She confirmed that they plan to accept patient to facility on 05/04/15 and cannot accept before then as the room is being painted and prepared for new residency.  CSW contacted Crecencio Mc- Admissions at [facility]. She stated that they would accept patient back with continued plan for d/c to [facility] next week. PT recommends Home Health PT- thus- the facility does not anticipate being able to secure [facility] coverage for patient but can utilize her Medicaid coverage.  Patient and son [name] notified of above and are agreeable to this plan. Patient verbalized disappointment that she will not be able to go directly to the ALF.  CSW notified [place]- RN to call report to facility. Patient will receive a PPD test prior to d/c that will need to be read by SNF staff in 48 hours.  This PPD result is required for admission to the ALF.  EMS will be arranged after PPD is given by nursing. No further CSW needs identified. CSW will sign off.  Lorie Phenix. Pauline Good, [place]

## 2015-04-28 NOTE — Discharge Summary (Addendum)
Physician Discharge Summary  [name] OJJ:[id number] DOB: 1934/02/11 DOA: 04/25/2015  PCP: No primary care provider on file.  Admit date: 04/25/2015 Discharge date: 04/28/2015  Time spent: 35 minutes  Recommendations for Outpatient Follow-up:  1. Back to SNF and then to ALF 2. Cbc, bmp 1 week 3. Daily weights  Discharge Diagnoses:  Principal Problem:   CHF exacerbation Active Problems:   HTN (hypertension)   Colostomy in place   Chronic renal insufficiency, stage II (mild)   Prolonged QT interval   Acute respiratory failure, unspecified whether with hypoxia or hypercapnia   Normal coronary arteries 03/22/15   Non-ischemic cardiomyopathy   ICD in place   GERD (gastroesophageal reflux disease)   Chronic systolic congestive heart failure   COPD (chronic obstructive pulmonary disease)   Discharge Condition: improved  Diet recommendation: cardiac  Filed Weights   04/26/15 0601 04/27/15 0523 04/28/15 0500  Weight: 81.92 kg (180 lb 9.6 oz) 80.65 kg (177 lb 12.8 oz) 79.47 kg (175 lb 3.2 oz)    History of present illness:  [name] is a 81 y.o. female with PMH of hypertension, hyperlipidemia, GERD, hypothyroidism, colon cancer (S/P of surgery, colostomy, ICD placement, COPD, systolic congestive heart failure (EF 35-40%), pulmonary embolism, CKD-II, who presents with shortness of breath.  Patient reports that in the past 2 days, she has been shortness breath, which has been progressively getting worse. She does not have cough, wheezing, chest pain, fever or chills. No tenderness over calf areas. Patient does not abdominal pain, diarrhea. She has a intermittent dysuria and burning on urination. Patient is on Lasix, therefore she cannot tell whether she has an increased urinary frequency. She hasmild leg edema, no unilateral weakness.  In ED, patient was found to have BNP 1336.1, WBC 11.8, temperature normal, stable renal function, INR 1.27, negative troponin, negative chest  x-ray for infiltration. Patient is admitted to inpatient for further evaluation and treatment.  Hospital Course:  CHF exacerbation of chronic systolic congestive heart failure and acute on chronic respiratory failure:  -IV lasix 40 mg bid- change back to PO lasix but BID- watch Cr and weight -ASA , lisinopril, Coreg  -recent echo shows: ejection fraction was in the range of 35% to 40% -strict In/Out -Daily weight-   COPD: Patient does not have wheezing, rhonchi on auscultation. No cough or chest pain. No infiltration on chest x-ray. Less likely to have COPD -DuoNeb nebulizers, Dulera and prn albuterol nebs  HTN: -continue Coreg, lisinopril   Hx of colon CA and Colostomy in place:  -no acute issues  Chronic renal insufficiency, stage II (mild): Baseline creatinine 1.0-1.2. Her creatinine is 1.21, which is close to baseline. -Follow-up renal function. BMP  GERD: -Pepcid  Procedures:    Consultations:    Discharge Exam: Filed Vitals:   04/28/15 1009  BP: 111/89  Pulse: 78  Temp: 97.7 F (36.5 C)  Resp: 18      Discharge Instructions   Discharge Instructions    (HEART FAILURE PATIENTS) Call MD:  Anytime you have any of the following symptoms: 1) 3 pound weight gain in 24 hours or 5 pounds in 1 week 2) shortness of breath, with or without a dry hacking cough 3) swelling in the hands, feet or stomach 4) if you have to sleep on extra pillows at night in order to breathe.    Complete by:  As directed      Diet - low sodium heart healthy    Complete by:  As  directed      Discharge instructions    Complete by:  As directed   Cbc, bmp 1 week Daily weights- may need increased lasix-- weight at d/c from hospital was 175lbs     Increase activity slowly    Complete by:  As directed           Current Discharge Medication List    START taking these medications   Details  albuterol (PROVENTIL) (2.5 MG/3ML) 0.083% nebulizer solution Take 3 mLs (2.5 mg total) by  nebulization every 4 (four) hours as needed for wheezing. Qty: 75 mL, Refills: 12    tuberculin 5 UNIT/0.1ML injection Inject 0.1 mLs (5 Units total) into the skin once. Refills: 0    vitamin B-12 1000 MCG tablet Take 1 tablet (1,000 mcg total) by mouth daily.      CONTINUE these medications which have CHANGED   Details  !! furosemide (LASIX) 40 MG tablet Take 1 tablet (40 mg total) by mouth 2 (two) times daily. Qty: 30 tablet     !! - Potential duplicate medications found. Please discuss with provider.    CONTINUE these medications which have NOT CHANGED   Details  acetaminophen (TYLENOL) 325 MG tablet Take 650 mg by mouth every 6 (six) hours as needed (pain).    ADVAIR DISKUS 500-50 MCG/DOSE AEPB Inhale 1 puff into the lungs every 12 (twelve) hours. Refills: 3    aspirin EC 81 MG tablet Take 1 tablet (81 mg total) by mouth every morning.    atorvastatin (LIPITOR) 20 MG tablet Take 20 mg by mouth daily at 6 PM.     capsicum (ZOSTRIX) 0.075 % topical cream Apply topically 2 (two) times daily. Qty: 28.3 g, Refills: 0    carvedilol (COREG) 6.25 MG tablet Take 1 tablet (6.25 mg total) by mouth 2 (two) times daily with a meal.    cholecalciferol (VITAMIN D) 1000 UNITS tablet Take 1,000 Units by mouth daily at 6 PM.    diclofenac sodium (VOLTAREN) 1 % GEL Apply 4 g topically 3 (three) times daily.    feeding supplement, ENSURE ENLIVE, (ENSURE ENLIVE) LIQD Take 237 mLs by mouth 2 (two) times daily between meals. Qty: 237 mL, Refills: 12    ferrous sulfate 325 (65 FE) MG tablet Take 325 mg by mouth every morning.    fluticasone (FLONASE) 50 MCG/ACT nasal spray Place 1 spray into both nostrils daily.    !! furosemide (LASIX) 40 MG tablet Take 1 tablet (40 mg total) by mouth daily. Qty: 30 tablet, Refills: 5    isosorbide mononitrate (IMDUR) 30 MG 24 hr tablet Take 1 tablet (30 mg total) by mouth daily. Qty: 30 tablet, Refills: 0    levothyroxine (SYNTHROID, LEVOTHROID) 50 MCG  tablet Take 50 mcg by mouth every morning.  Refills: 2    lisinopril (PRINIVIL,ZESTRIL) 5 MG tablet Take 1 tablet (5 mg total) by mouth daily. Qty: 30 tablet, Refills: 0    Omega 3 1000 MG CAPS Take 1,000 mg by mouth daily at 6 PM.     ranitidine (ZANTAC) 150 MG tablet Take 150 mg by mouth daily at 6 PM.  Refills: 11    oxymetazoline (AFRIN) 0.05 % nasal spray Place 1 spray into both nostrils 2 (two) times daily as needed (nose bleed). Qty: 30 mL, Refills: 0     !! - Potential duplicate medications found. Please discuss with provider.    STOP taking these medications     naproxen (NAPROSYN) 500 MG tablet  No Known Allergies    The results of significant diagnostics from this hospitalization (including imaging, microbiology, ancillary and laboratory) are listed below for reference.    Significant Diagnostic Studies: Dg Chest Port 1 View  04/25/2015   CLINICAL DATA:  81 year old female with shortness of breath  EXAM: PORTABLE CHEST - 1 VIEW  COMPARISON:  CT dated 03/19/2015  FINDINGS: Single-view of the chest demonstrate hypovolemic but clear lungs. No significant pleural effusion. No pneumothorax. Cardiomegaly. Left pectoral pacemaker device. Degenerative changes of the spine.  IMPRESSION: Cardiomegaly.  No focal consolidation.   Electronically Signed   By: Anner Crete M.D.   On: 04/25/2015 19:07    Microbiology: Recent Results (from the past 240 hour(s))  MRSA PCR Screening     Status: None   Collection Time: 04/25/15 11:40 PM  Result Value Ref Range Status   MRSA by PCR NEGATIVE NEGATIVE Final    Comment:        The GeneXpert MRSA Assay (FDA approved for NASAL specimens only), is one component of a comprehensive MRSA colonization surveillance program. It is not intended to diagnose MRSA infection nor to guide or monitor treatment for MRSA infections.   Urine culture     Status: None   Collection Time: 04/26/15 12:18 AM  Result Value Ref Range Status    Specimen Description URINE, CLEAN CATCH  Final   Special Requests NONE  Final   Culture 9,000 COLONIES/mL INSIGNIFICANT GROWTH  Final   Report Status 04/27/2015 FINAL  Final     Labs: Basic Metabolic Panel:  Recent Labs Lab 04/25/15 1924 04/26/15 0220 04/27/15 0344 04/28/15 0851  NA 140 142 140 141  K 4.2 4.0 3.9 4.5  CL 106 106 102 103  CO2 20* 24 28 26   GLUCOSE 159* 78 68 114*  BUN 21* 23* 20 27*  CREATININE 1.21* 0.96 0.97 1.13*  CALCIUM 9.7 9.7 9.6 9.7  MG  --  1.6*  --   --    Liver Function Tests: No results for input(s): AST, ALT, ALKPHOS, BILITOT, PROT, ALBUMIN in the last 168 hours. No results for input(s): LIPASE, AMYLASE in the last 168 hours. No results for input(s): AMMONIA in the last 168 hours. CBC:  Recent Labs Lab 04/25/15 1924 04/27/15 0344  WBC 11.8* 6.5  NEUTROABS 8.8*  --   HGB 9.2* 8.4*  HCT 30.2* 27.3*  MCV 88.3 87.8  PLT 433* 383   Cardiac Enzymes:  Recent Labs Lab 04/25/15 1924 04/25/15 2253 04/26/15 0432 04/26/15 1100  TROPONINI 0.03 0.03 0.03 <0.03   BNP: BNP (last 3 results)  Recent Labs  03/18/15 0305 04/25/15 1924  BNP 1270.3* 1336.1*    ProBNP (last 3 results) No results for input(s): PROBNP in the last 8760 hours.  CBG: No results for input(s): GLUCAP in the last 168 hours.     SignedEulogio Bear  [facility] 04/28/2015, 1:04 PM

## 2015-04-28 NOTE — Progress Notes (Signed)
Report called to [employer].  Spoke with [facility] LPN.

## 2015-07-23 ENCOUNTER — Emergency Department (HOSPITAL_COMMUNITY): Payer: Medicare HMO

## 2015-07-23 ENCOUNTER — Encounter (HOSPITAL_COMMUNITY): Payer: Self-pay

## 2015-07-23 ENCOUNTER — Inpatient Hospital Stay (HOSPITAL_COMMUNITY)
Admission: EM | Admit: 2015-07-23 | Discharge: 2015-07-29 | DRG: 871 | Disposition: A | Discharge status: Nursing Home (Includes ICF) | Payer: Medicare HMO | Attending: Internal Medicine | Admitting: Internal Medicine

## 2015-07-23 DIAGNOSIS — N183 Chronic kidney disease, stage 3 unspecified: Secondary | ICD-10-CM | POA: Diagnosis present

## 2015-07-23 DIAGNOSIS — Z8249 Family history of ischemic heart disease and other diseases of the circulatory system: Secondary | ICD-10-CM

## 2015-07-23 DIAGNOSIS — Z13820 Encounter for screening for osteoporosis: Secondary | ICD-10-CM

## 2015-07-23 DIAGNOSIS — A419 Sepsis, unspecified organism: Secondary | ICD-10-CM | POA: Diagnosis not present

## 2015-07-23 DIAGNOSIS — D649 Anemia, unspecified: Secondary | ICD-10-CM | POA: Diagnosis present

## 2015-07-23 DIAGNOSIS — R6510 Systemic inflammatory response syndrome (SIRS) of non-infectious origin without acute organ dysfunction: Secondary | ICD-10-CM | POA: Diagnosis present

## 2015-07-23 DIAGNOSIS — E785 Hyperlipidemia, unspecified: Secondary | ICD-10-CM | POA: Diagnosis present

## 2015-07-23 DIAGNOSIS — Z9049 Acquired absence of other specified parts of digestive tract: Secondary | ICD-10-CM

## 2015-07-23 DIAGNOSIS — G9340 Encephalopathy, unspecified: Secondary | ICD-10-CM | POA: Diagnosis present

## 2015-07-23 DIAGNOSIS — E860 Dehydration: Secondary | ICD-10-CM | POA: Diagnosis present

## 2015-07-23 DIAGNOSIS — I5023 Acute on chronic systolic (congestive) heart failure: Secondary | ICD-10-CM | POA: Diagnosis present

## 2015-07-23 DIAGNOSIS — R509 Fever, unspecified: Secondary | ICD-10-CM

## 2015-07-23 DIAGNOSIS — R32 Unspecified urinary incontinence: Secondary | ICD-10-CM | POA: Diagnosis present

## 2015-07-23 DIAGNOSIS — D509 Iron deficiency anemia, unspecified: Secondary | ICD-10-CM | POA: Diagnosis present

## 2015-07-23 DIAGNOSIS — J449 Chronic obstructive pulmonary disease, unspecified: Secondary | ICD-10-CM | POA: Diagnosis present

## 2015-07-23 DIAGNOSIS — I5022 Chronic systolic (congestive) heart failure: Secondary | ICD-10-CM | POA: Diagnosis present

## 2015-07-23 DIAGNOSIS — N189 Chronic kidney disease, unspecified: Secondary | ICD-10-CM | POA: Diagnosis not present

## 2015-07-23 DIAGNOSIS — N182 Chronic kidney disease, stage 2 (mild): Secondary | ICD-10-CM | POA: Diagnosis present

## 2015-07-23 DIAGNOSIS — Z933 Colostomy status: Secondary | ICD-10-CM

## 2015-07-23 DIAGNOSIS — J410 Simple chronic bronchitis: Secondary | ICD-10-CM | POA: Diagnosis not present

## 2015-07-23 DIAGNOSIS — I130 Hypertensive heart and chronic kidney disease with heart failure and stage 1 through stage 4 chronic kidney disease, or unspecified chronic kidney disease: Secondary | ICD-10-CM | POA: Diagnosis present

## 2015-07-23 DIAGNOSIS — T501X5A Adverse effect of loop [high-ceiling] diuretics, initial encounter: Secondary | ICD-10-CM | POA: Diagnosis present

## 2015-07-23 DIAGNOSIS — I429 Cardiomyopathy, unspecified: Secondary | ICD-10-CM | POA: Diagnosis present

## 2015-07-23 DIAGNOSIS — E1122 Type 2 diabetes mellitus with diabetic chronic kidney disease: Secondary | ICD-10-CM | POA: Diagnosis present

## 2015-07-23 DIAGNOSIS — Z86711 Personal history of pulmonary embolism: Secondary | ICD-10-CM

## 2015-07-23 DIAGNOSIS — C20 Malignant neoplasm of rectum: Secondary | ICD-10-CM | POA: Diagnosis present

## 2015-07-23 DIAGNOSIS — E039 Hypothyroidism, unspecified: Secondary | ICD-10-CM | POA: Diagnosis present

## 2015-07-23 DIAGNOSIS — R062 Wheezing: Secondary | ICD-10-CM

## 2015-07-23 DIAGNOSIS — D631 Anemia in chronic kidney disease: Secondary | ICD-10-CM | POA: Diagnosis present

## 2015-07-23 DIAGNOSIS — F0390 Unspecified dementia without behavioral disturbance: Secondary | ICD-10-CM | POA: Diagnosis not present

## 2015-07-23 DIAGNOSIS — Z95810 Presence of automatic (implantable) cardiac defibrillator: Secondary | ICD-10-CM

## 2015-07-23 DIAGNOSIS — G9341 Metabolic encephalopathy: Secondary | ICD-10-CM | POA: Diagnosis present

## 2015-07-23 DIAGNOSIS — R0600 Dyspnea, unspecified: Secondary | ICD-10-CM

## 2015-07-23 DIAGNOSIS — E876 Hypokalemia: Secondary | ICD-10-CM | POA: Diagnosis present

## 2015-07-23 DIAGNOSIS — Z85038 Personal history of other malignant neoplasm of large intestine: Secondary | ICD-10-CM

## 2015-07-23 DIAGNOSIS — I10 Essential (primary) hypertension: Secondary | ICD-10-CM | POA: Diagnosis present

## 2015-07-23 DIAGNOSIS — Z90710 Acquired absence of both cervix and uterus: Secondary | ICD-10-CM

## 2015-07-23 DIAGNOSIS — Z862 Personal history of diseases of the blood and blood-forming organs and certain disorders involving the immune mechanism: Secondary | ICD-10-CM

## 2015-07-23 LAB — COMPREHENSIVE METABOLIC PANEL
ALT: 13 U/L — ABNORMAL LOW (ref 14–54)
AST: 22 U/L (ref 15–41)
Albumin: 4.1 g/dL (ref 3.5–5.0)
Alkaline Phosphatase: 49 U/L (ref 38–126)
Anion gap: 11 (ref 5–15)
BUN: 17 mg/dL (ref 6–20)
CO2: 26 mmol/L (ref 22–32)
Calcium: 10 mg/dL (ref 8.9–10.3)
Chloride: 104 mmol/L (ref 101–111)
Creatinine, Ser: 1.04 mg/dL — ABNORMAL HIGH (ref 0.44–1.00)
GFR calc Af Amer: 58 mL/min — ABNORMAL LOW (ref >60)
GFR calc non Af Amer: 50 mL/min — ABNORMAL LOW (ref >60)
Glucose, Bld: 87 mg/dL (ref 65–99)
Potassium: 3.9 mmol/L (ref 3.5–5.1)
Sodium: 141 mmol/L (ref 135–145)
Total Bilirubin: 0.8 mg/dL (ref 0.3–1.2)
Total Protein: 8.3 g/dL — ABNORMAL HIGH (ref 6.5–8.1)

## 2015-07-23 LAB — URINALYSIS, ROUTINE W REFLEX MICROSCOPIC
Bilirubin Urine: NEGATIVE
Glucose, UA: NEGATIVE mg/dL
Ketones, ur: NEGATIVE mg/dL
Leukocytes, UA: NEGATIVE
Nitrite: NEGATIVE
Protein, ur: 100 mg/dL — AB
Specific Gravity, Urine: 1.01 (ref 1.005–1.030)
Urobilinogen, UA: 0.2 mg/dL (ref 0.0–1.0)
pH: 6 (ref 5.0–8.0)

## 2015-07-23 LAB — CBC WITH DIFFERENTIAL/PLATELET
Basophils Absolute: 0 10*3/uL (ref 0.0–0.1)
Basophils Relative: 0 %
Eosinophils Absolute: 0 10*3/uL (ref 0.0–0.7)
Eosinophils Relative: 0 %
HCT: 35.2 % — ABNORMAL LOW (ref 36.0–46.0)
Hemoglobin: 11 g/dL — ABNORMAL LOW (ref 12.0–15.0)
Lymphocytes Relative: 33 %
Lymphs Abs: 2.1 10*3/uL (ref 0.7–4.0)
MCH: 27.8 pg (ref 26.0–34.0)
MCHC: 31.3 g/dL (ref 30.0–36.0)
MCV: 88.9 fL (ref 78.0–100.0)
Monocytes Absolute: 1 10*3/uL (ref 0.1–1.0)
Monocytes Relative: 16 %
Neutro Abs: 3.2 10*3/uL (ref 1.7–7.7)
Neutrophils Relative %: 51 %
Platelets: 278 10*3/uL (ref 150–400)
RBC: 3.96 MIL/uL (ref 3.87–5.11)
RDW: 15.7 % — ABNORMAL HIGH (ref 11.5–15.5)
WBC: 6.4 10*3/uL (ref 4.0–10.5)

## 2015-07-23 LAB — URINE MICROSCOPIC-ADD ON

## 2015-07-23 LAB — I-STAT CG4 LACTIC ACID, ED: Lactic Acid, Venous: 1.6 mmol/L (ref 0.5–2.0)

## 2015-07-23 MED ORDER — VANCOMYCIN HCL IN DEXTROSE 1-5 GM/200ML-% IV SOLN
1000.0000 mg | Freq: Once | INTRAVENOUS | Status: AC
  Administered 2015-07-23: 1000 mg via INTRAVENOUS
  Filled 2015-07-23: qty 200

## 2015-07-23 MED ORDER — PIPERACILLIN-TAZOBACTAM 3.375 G IVPB
3.3750 g | Freq: Three times a day (TID) | INTRAVENOUS | Status: DC
  Administered 2015-07-24 – 2015-07-25 (×4): 3.375 g via INTRAVENOUS
  Filled 2015-07-23 (×4): qty 50

## 2015-07-23 MED ORDER — VANCOMYCIN HCL IN DEXTROSE 750-5 MG/150ML-% IV SOLN
750.0000 mg | Freq: Two times a day (BID) | INTRAVENOUS | Status: DC
  Administered 2015-07-24 – 2015-07-25 (×3): 750 mg via INTRAVENOUS
  Filled 2015-07-23 (×4): qty 150

## 2015-07-23 MED ORDER — PIPERACILLIN-TAZOBACTAM 3.375 G IVPB 30 MIN
3.3750 g | Freq: Once | INTRAVENOUS | Status: AC
  Administered 2015-07-23: 3.375 g via INTRAVENOUS
  Filled 2015-07-23: qty 50

## 2015-07-23 MED ORDER — SODIUM CHLORIDE 0.9 % IV BOLUS (SEPSIS)
500.0000 mL | Freq: Once | INTRAVENOUS | Status: AC
  Administered 2015-07-23: 500 mL via INTRAVENOUS

## 2015-07-23 NOTE — H&P (Addendum)
PCP:  No primary care provider on file.  Cardiology Dr. Ellyn Hack [facility] [place]  Referring provider Lawyer   Chief Complaint: Confusion and fatigue  HPI: [name] is a 81 y.o. female   has a past medical history of Hypertension; Asthma; CHF (congestive heart failure) ([place]); COPD (chronic obstructive pulmonary disease) ([place]); Colon cancer ([place]); Hyperlipidemia; Heart murmur; Anginal pain ([place]); Pulmonary embolism ([place]); Pneumonia; Hypothyroidism; Diabetes mellitus without complication ([place]); History of blood transfusion; Arthritis; Renal disorder; and Colostomy care [facility]).   Presented from nursing home [facility]) with complaints of worsening fatigue and decreased activity. Her records at her baseline patient able to care for herself and ambulates with a walker. Per nursing home staff today she has been incontinent she had had some low extremity edema. Patient did not endorse any nausea no vomiting no diarrhea no chest pain no headache she denies being short of breath. Patient was not able to provide much of a history secondary to dementia. Given tachycardia patient had her pacemaker interrogated which was unremarkable. In emergency department she was noted to be febrile up to 102.6 with heart rate up to 102 normal white blood cell count. UA did not show any evidence of infection. Chest x-ray showed mid vascular congestion was a pulmonary edema mild left basilar atelectasis but otherwise no infiltrate. Patient was started on broad-spectrum antibiotics for presumed SIRS. Lactic acid was1.6 Patient has history of colon cancer status post surgical resection colostomy followed by oncology She has history of nonischemic cardiomyopathy with systolic heart failure with EF of 35-40 percent.  Hospitalist was called for admission for SIRS  Review of Systems:    Pertinent positives include: Incontinence and fatigue  Constitutional:  No weight loss, night sweats,  HEENT:  No headaches,  Difficulty swallowing,Tooth/dental problems,Sore throat,  No sneezing, itching, ear ache, nasal congestion, post nasal drip,  Cardio-vascular:  No chest pain, Orthopnea, PND, anasarca, dizziness, palpitations.no Bilateral lower extremity swelling  GI:  No heartburn, indigestion, abdominal pain, nausea, vomiting, diarrhea, change in bowel habits, loss of appetite, melena, blood in stool, hematemesis Resp:  no shortness of breath at rest. No dyspnea on exertion, No excess mucus, no productive cough, No non-productive cough, No coughing up of blood.No change in color of mucus.No wheezing. Skin:  no rash or lesions. No jaundice GU:  no dysuria, change in color of urine, no urgency or frequency. No straining to urinate.  No flank pain.  Musculoskeletal:  No joint pain or no joint swelling. No decreased range of motion. No back pain.  Psych:  No change in mood or affect. No depression or anxiety. No memory loss.  Neuro: no localizing neurological complaints, no tingling, no weakness, no double vision, no gait abnormality, no slurred speech, no confusion  Otherwise ROS are negative except for above, 10 systems were reviewed  Past Medical History: Past Medical History  Diagnosis Date  . Hypertension   . Asthma   . CHF (congestive heart failure) ([place])   . COPD (chronic obstructive pulmonary disease) ([place])   . Colon cancer ([place])   . Hyperlipidemia   . Heart murmur   . Anginal pain ([place])   . Pulmonary embolism ([place])   . Pneumonia     "just once" (03/01/2015)  . Hypothyroidism   . Diabetes mellitus without complication ([place])     pt denies this hx on 03/01/2015  . History of blood transfusion     "related to anemia, this is my 1st" (03/01/2015)  . Arthritis     "  comes and goes" (03/01/2015)  . Renal disorder   . Colostomy care [facility])    Past Surgical History  Procedure Laterality Date  . Cholecystectomy    . Colon surgery      cancer  . Fracture surgery    . Dilation and curettage of  uterus    . Tubal ligation    . Cataract extraction w/ intraocular lens  implant, bilateral Bilateral   . Ankle fracture surgery Left   . Abdominal hysterectomy    . Cardiac catheterization N/A 03/22/2015    Procedure: Right/Left Heart Cath and Coronary Angiography;  Surgeon: Leonie Man, MD;  Location: [facility];  Service: Cardiovascular;  Laterality: N/A;     Medications: Prior to Admission medications   Medication Sig Start Date End Date Taking? Authorizing Provider  acetaminophen (TYLENOL) 325 MG tablet Take 650 mg by mouth every 6 (six) hours as needed (pain).   Yes Historical Provider, MD  ADVAIR DISKUS 500-50 MCG/DOSE AEPB Inhale 1 puff into the lungs every 12 (twelve) hours. 12/28/14  Yes Historical Provider, MD  aspirin EC 81 MG tablet Take 1 tablet (81 mg total) by mouth every morning. 03/03/15  Yes Ripudeep K Rai, MD  atorvastatin (LIPITOR) 20 MG tablet Take 20 mg by mouth daily.    Yes Historical Provider, MD  carvedilol (COREG) 6.25 MG tablet Take 1 tablet (6.25 mg total) by mouth 2 (two) times daily with a meal. 03/04/15  Yes Geradine Girt, DO  cholecalciferol (VITAMIN D) 1000 UNITS tablet Take 1,000 Units by mouth daily at 6 PM.   Yes Historical Provider, MD  diclofenac sodium (VOLTAREN) 1 % GEL Apply 4 g topically 3 (three) times daily.   Yes Historical Provider, MD  ferrous sulfate 325 (65 FE) MG tablet Take 325 mg by mouth every morning.   Yes Historical Provider, MD  furosemide (LASIX) 40 MG tablet Take 1 tablet (40 mg total) by mouth daily. 03/24/15  Yes Nishant Dhungel, MD  isosorbide mononitrate (IMDUR) 30 MG 24 hr tablet Take 1 tablet (30 mg total) by mouth daily. 03/24/15  Yes Nishant Dhungel, MD  levothyroxine (SYNTHROID, LEVOTHROID) 50 MCG tablet Take 50 mcg by mouth every morning.  01/16/15  Yes Historical Provider, MD  lisinopril (PRINIVIL,ZESTRIL) 5 MG tablet Take 1 tablet (5 mg total) by mouth daily. 03/24/15  Yes Nishant Dhungel, MD  ranitidine (ZANTAC)  150 MG tablet Take 150 mg by mouth daily.  01/14/15  Yes Historical Provider, MD  albuterol (PROVENTIL) (2.5 MG/3ML) 0.083% nebulizer solution Take 3 mLs (2.5 mg total) by nebulization every 4 (four) hours as needed for wheezing. 04/28/15   Geradine Girt, DO  capsicum (ZOSTRIX) 0.075 % topical cream Apply topically 2 (two) times daily. 03/02/15   Ripudeep Krystal Eaton, MD  feeding supplement, ENSURE ENLIVE, (ENSURE ENLIVE) LIQD Take 237 mLs by mouth 2 (two) times daily between meals. 03/02/15   Ripudeep Krystal Eaton, MD  oxymetazoline (AFRIN) 0.05 % nasal spray Place 1 spray into both nostrils 2 (two) times daily as needed (nose bleed). 03/02/15   Ripudeep Krystal Eaton, MD  tuberculin 5 UNIT/0.1ML injection Inject 0.1 mLs (5 Units total) into the skin once. 04/28/15   Geradine Girt, DO  vitamin B-12 1000 MCG tablet Take 1 tablet (1,000 mcg total) by mouth daily. 04/28/15   Geradine Girt, DO    Allergies:  No Known Allergies  Social History:  Ambulatory  walker    From facility [facility]  reports that she has never smoked. She has never used smokeless tobacco. She reports that she does not drink alcohol or use illicit drugs.    Family History: family history includes CAD in an other family member; Hypertension in an other family member; Stroke in an other family member. There is no history of Colon cancer or GI Bleed.    Physical Exam: Patient Vitals for the past 24 hrs:  BP Temp Temp src Pulse Resp SpO2 Height Weight  07/23/15 2130 - - - - - - 5\' 8"  (1.727 m) 81.647 kg (180 lb)  07/23/15 2130 141/74 mmHg - - 102 24 97 % - -  07/23/15 2100 141/62 mmHg - - 99 15 96 % - -  07/23/15 2039 - 102.6 F (39.2 C) Rectal - - - - -  07/23/15 1923 165/84 mmHg 101.1 F (38.4 C) Oral 98 25 97 % - -  07/23/15 1725 157/69 mmHg - - 95 24 95 % - -  07/23/15 1610 - 98 F (36.7 C) Rectal - - - - -  07/23/15 1510 - - - - - 98 % - -  07/23/15 1507 176/74 mmHg 99.5 F (37.5 C) Oral 99 18 96 % - -    1. General:  in No  Acute distress 2. Psychological: Alert   Oriented to self not to place 3. Head/ENT:    Dry Mucous Membranes                          Head Non traumatic, neck supple                          Normal  Dentition 4. SKIN:   decreased Skin turgor,  Skin clean Dry and intact no rash 5. Heart: Regular rate and rhythm no Murmur, Rub or gallop 6. Lungs:  no wheezes or crackles somewhat distant breath sounds   7. Abdomen: Soft, non-tender, Non distended 8. Lower extremities: no clubbing, cyanosis, or edema obese 9. Neurologically Grossly intact, moving all 4 extremities equally 10. MSK: Normal range of motion  body mass index is 27.38 kg/(m^2).   Labs on Admission:   Results for orders placed or performed during the hospital encounter of 07/23/15 (from the past 24 hour(s))  Urinalysis, Routine w reflex microscopic     Status: Abnormal   Collection Time: 07/23/15  4:10 PM  Result Value Ref Range   Color, Urine YELLOW YELLOW   APPearance CLEAR CLEAR   Specific Gravity, Urine 1.010 1.005 - 1.030   pH 6.0 5.0 - 8.0   Glucose, UA NEGATIVE NEGATIVE mg/dL   Hgb urine dipstick TRACE (A) NEGATIVE   Bilirubin Urine NEGATIVE NEGATIVE   Ketones, ur NEGATIVE NEGATIVE mg/dL   Protein, ur 100 (A) NEGATIVE mg/dL   Urobilinogen, UA 0.2 0.0 - 1.0 mg/dL   Nitrite NEGATIVE NEGATIVE   Leukocytes, UA NEGATIVE NEGATIVE  Urine microscopic-add on     Status: None   Collection Time: 07/23/15  4:10 PM  Result Value Ref Range   Squamous Epithelial / LPF RARE RARE   WBC, UA 0-2 <3 WBC/hpf   RBC / HPF 0-2 <3 RBC/hpf   Bacteria, UA RARE RARE   Urine-Other MUCOUS PRESENT   Comprehensive metabolic panel     Status: Abnormal   Collection Time: 07/23/15  5:08 PM  Result Value Ref Range   Sodium 141 135 - 145 mmol/L   Potassium 3.9 3.5 -  5.1 mmol/L   Chloride 104 101 - 111 mmol/L   CO2 26 22 - 32 mmol/L   Glucose, Bld 87 65 - 99 mg/dL   BUN 17 6 - 20 mg/dL   Creatinine, Ser 1.04 (H) 0.44 - 1.00 mg/dL   Calcium  10.0 8.9 - 10.3 mg/dL   Total Protein 8.3 (H) 6.5 - 8.1 g/dL   Albumin 4.1 3.5 - 5.0 g/dL   AST 22 15 - 41 U/L   ALT 13 (L) 14 - 54 U/L   Alkaline Phosphatase 49 38 - 126 U/L   Total Bilirubin 0.8 0.3 - 1.2 mg/dL   GFR calc non Af Amer 50 (L) >60 mL/min   GFR calc Af Amer 58 (L) >60 mL/min   Anion gap 11 5 - 15  CBC with Differential     Status: Abnormal   Collection Time: 07/23/15  5:08 PM  Result Value Ref Range   WBC 6.4 4.0 - 10.5 K/uL   RBC 3.96 3.87 - 5.11 MIL/uL   Hemoglobin 11.0 (L) 12.0 - 15.0 g/dL   HCT 35.2 (L) 36.0 - 46.0 %   MCV 88.9 78.0 - 100.0 fL   MCH 27.8 26.0 - 34.0 pg   MCHC 31.3 30.0 - 36.0 g/dL   RDW 15.7 (H) 11.5 - 15.5 %   Platelets 278 150 - 400 K/uL   Neutrophils Relative % 51 %   Neutro Abs 3.2 1.7 - 7.7 K/uL   Lymphocytes Relative 33 %   Lymphs Abs 2.1 0.7 - 4.0 K/uL   Monocytes Relative 16 %   Monocytes Absolute 1.0 0.1 - 1.0 K/uL   Eosinophils Relative 0 %   Eosinophils Absolute 0.0 0.0 - 0.7 K/uL   Basophils Relative 0 %   Basophils Absolute 0.0 0.0 - 0.1 K/uL  I-Stat CG4 Lactic Acid, ED     Status: None   Collection Time: 07/23/15  5:14 PM  Result Value Ref Range   Lactic Acid, Venous 1.60 0.5 - 2.0 mmol/L    UA no evidence of UTI  Lab Results  Component Value Date   HGBA1C 6.0* 04/26/2015    Estimated Creatinine Clearance: 49.2 mL/min (by C-G formula based on Cr of 1.04).  BNP (last 3 results) No results for input(s): PROBNP in the last 8760 hours.  Other results:  I have pearsonaly reviewed this: ECG REPORT  Rate:109  Rhythm: paced     Filed Weights   07/23/15 2130  Weight: 81.647 kg (180 lb)     Cultures:    Component Value Date/Time   SDES URINE, CLEAN CATCH 04/26/2015 0018   SPECREQUEST NONE 04/26/2015 0018   CULT 9,000 COLONIES/mL INSIGNIFICANT GROWTH 04/26/2015 0018   REPTSTATUS 04/27/2015 FINAL 04/26/2015 0018     Radiological Exams on Admission: Dg Chest 2 View  07/23/2015  CLINICAL DATA:   Generalized weakness EXAM: CHEST  2 VIEW COMPARISON:  04/25/2015 FINDINGS: Cardiomegaly again noted. Central mild vascular congestion without convincing pulmonary edema. Dual lead cardiac pacemaker is unchanged in position. Mild left basilar atelectasis. Thoracic spine osteopenia and mild degenerative changes. IMPRESSION: Central mild vascular congestion without pulmonary edema. Cardiomegaly again noted. Mild left basilar atelectasis. Dual lead cardiac pacemaker in place. Electronically Signed   By: Lahoma Crocker M.D.   On: 07/23/2015 18:48    Chart has been reviewed  Family not at  Bedside   Assessment/Plan  81 year old female history of diabetes, COPD, chronic renal insufficiency, chronic systolic heart failure with EF of 35-40 percent status  post AICD placement here with fever worrisome for SIRS  Present on Admission:  . SIRS (systemic inflammatory response syndrome) (HCC) - we'll admit for observation administer IV antibiotics rehydrate been mindful of patient's history of heart failure. Obtain protocol sit on and levels and blood culture. Will order influenza panel , droplet precautions . HTN (hypertension) continue isosorbide will and Coreg but hold lisinopril  watch blood pressure and renal function  . COPD (chronic obstructive pulmonary disease) (HCC) chronic continue home medications  . Chronic renal insufficiency, stage II (mild) chronic continue to monitor creatinine currently stable  . Chronic systolic congestive heart failure ([place]) for today hold Lasix. While patient has SIRS We'll probably need to resume tomorrow  . Anemia chronic continue to monitor  . Dementia chronic currently stable monitor for deterioration   Prophylaxis: Lovenox   CODE STATUS:  FULL CODE as per patient   Disposition                           Back to current facility when stable                            Other plan as per orders.  I have spent a total of  55 min on this  admission  Wylma Tatem 07/23/2015, 10:40 PM  [facility]  Pager [phone]   after 2 AM please page floor coverage PA If 7AM-7PM, please contact the day team taking care of the patient  [email or web address]  Password [email or web address]

## 2015-07-23 NOTE — ED Notes (Signed)
EMS patient is 81 yo female from [place] with generalized weakness overall not feeling well, normally self caring and ambulatory with walker however, today has been incontinent.  Edema in hands and feet, denies pain, denies N/V/D.

## 2015-07-23 NOTE — ED Notes (Signed)
Patient reports overall not feeling well.  Does not report any pain, N/V/D.  Patient is A&O to person and place.

## 2015-07-23 NOTE — ED Notes (Signed)
Bed: VV61 Expected date:  Expected time:  Means of arrival:  Comments: EMS- 81yo F, weakness/incontience

## 2015-07-23 NOTE — Progress Notes (Signed)
ANTIBIOTIC CONSULT NOTE - INITIAL  Pharmacy Consult for vancomycin and zosyn Indication: sepsis  No Known Allergies   Vital Signs: Temp: 102.6 F (39.2 C) (10/14 2039) Temp Source: Rectal (10/14 2039) BP: 165/84 mmHg (10/14 1923) Pulse Rate: 98 (10/14 1923) Intake/Output from previous day:   Intake/Output from this shift: Total I/O In: 500 [I.V.:500] Out: -   Labs:  Recent Labs  07/23/15 1708  WBC 6.4  HGB 11.0*  PLT 278  CREATININE 1.04*   CrCl cannot be calculated (Unknown ideal weight.). No results for input(s): VANCOTROUGH, VANCOPEAK, VANCORANDOM, GENTTROUGH, GENTPEAK, GENTRANDOM, TOBRATROUGH, TOBRAPEAK, TOBRARND, AMIKACINPEAK, AMIKACINTROU, AMIKACIN in the last 72 hours.   Microbiology: No results found for this or any previous visit (from the past 720 hour(s)).  Medical History: Past Medical History  Diagnosis Date  . Hypertension   . Asthma   . CHF (congestive heart failure) ([place])   . COPD (chronic obstructive pulmonary disease) ([place])   . Colon cancer ([place])   . Hyperlipidemia   . Heart murmur   . Anginal pain ([place])   . Pulmonary embolism ([place])   . Pneumonia     "just once" (03/01/2015)  . Hypothyroidism   . Diabetes mellitus without complication ([place])     pt denies this hx on 03/01/2015  . History of blood transfusion     "related to anemia, this is my 1st" (03/01/2015)  . Arthritis     "comes and goes" (03/01/2015)  . Renal disorder   . Colostomy care [facility])    Assessment: No H&P, EMS patient is 82 yo female from [place] with generalized weakness overall not feeling well, normally self caring and ambulatory with walker however, today has been incontinent. Edema in hands and feet, denies pain, denies N/V/D.  Scr 1.04, CrCl ~ 33mls/min (N)  Goal of Therapy:  Vancomycin trough level 15-20 mcg/ml  Plan:  Zosyn 3.375g IV Q8H infused over 4hrs. (1st dose in ED) Vancomycin 1gm IV x 1 in ED, then 750mg  IV q12h Follow renal function,  cultures, clinical course  Dolly Rias RPh 07/23/2015, 9:20 PM Pager [phone]    Kavin Leech E 07/23/2015,9:15 PM

## 2015-07-23 NOTE — ED Provider Notes (Signed)
CSN: [id number]     Arrival date & time 07/23/15  1506 History   First MD Initiated Contact with Patient 07/23/15 1515     Chief Complaint  Patient presents with  . Fatigue     (Consider location/radiation/quality/duration/timing/severity/associated sxs/prior Treatment) HPI Patient presents to the emergency department with generalized weakness and fatigue.  The patient is from a nursing facility and reports that she is just feeling weak and fatigued.  She cannot give me any other complaints.  The patient states that she does not have any chest pain, shortness of breath, nausea, vomiting, weakness, dizziness, headache, blurred vision, back pain, neck pain, fever, chills, abdominal pain, or syncope.  Patient states nothing seemed to make her condition better or worse. Past Medical History  Diagnosis Date  . Hypertension   . Asthma   . CHF (congestive heart failure) ([place])   . COPD (chronic obstructive pulmonary disease) ([place])   . Colon cancer ([place])   . Hyperlipidemia   . Heart murmur   . Anginal pain ([place])   . Pulmonary embolism ([place])   . Pneumonia     "just once" (03/01/2015)  . Hypothyroidism   . Diabetes mellitus without complication ([place])     pt denies this hx on 03/01/2015  . History of blood transfusion     "related to anemia, this is my 1st" (03/01/2015)  . Arthritis     "comes and goes" (03/01/2015)  . Renal disorder   . Colostomy care [facility])    Past Surgical History  Procedure Laterality Date  . Cholecystectomy    . Colon surgery      cancer  . Fracture surgery    . Dilation and curettage of uterus    . Tubal ligation    . Cataract extraction w/ intraocular lens  implant, bilateral Bilateral   . Ankle fracture surgery Left   . Abdominal hysterectomy    . Cardiac catheterization N/A 03/22/2015    Procedure: Right/Left Heart Cath and Coronary Angiography;  Surgeon: Leonie Man, MD;  Location: [facility];  Service: Cardiovascular;  Laterality: N/A;   Family  History  Problem Relation Age of Onset  . CAD    . Hypertension    . Stroke    . Colon cancer Neg Hx   . GI Bleed Neg Hx    Social History  Substance Use Topics  . Smoking status: Never Smoker   . Smokeless tobacco: Never Used  . Alcohol Use: No   OB History    No data available     Review of Systems  All other systems negative except as documented in the HPI. All pertinent positives and negatives as reviewed in the HPI.  Allergies  Review of patient's allergies indicates no known allergies.  Home Medications   Prior to Admission medications   Medication Sig Start Date End Date Taking? Authorizing Provider  acetaminophen (TYLENOL) 325 MG tablet Take 650 mg by mouth every 6 (six) hours as needed (pain).   Yes Historical Provider, MD  ADVAIR DISKUS 500-50 MCG/DOSE AEPB Inhale 1 puff into the lungs every 12 (twelve) hours. 12/28/14  Yes Historical Provider, MD  aspirin EC 81 MG tablet Take 1 tablet (81 mg total) by mouth every morning. 03/03/15  Yes Ripudeep K Rai, MD  atorvastatin (LIPITOR) 20 MG tablet Take 20 mg by mouth daily.    Yes Historical Provider, MD  carvedilol (COREG) 6.25 MG tablet Take 1 tablet (6.25 mg total) by mouth 2 (two) times  daily with a meal. 03/04/15  Yes Geradine Girt, DO  cholecalciferol (VITAMIN D) 1000 UNITS tablet Take 1,000 Units by mouth daily at 6 PM.   Yes Historical Provider, MD  diclofenac sodium (VOLTAREN) 1 % GEL Apply 4 g topically 3 (three) times daily.   Yes Historical Provider, MD  ferrous sulfate 325 (65 FE) MG tablet Take 325 mg by mouth every morning.   Yes Historical Provider, MD  furosemide (LASIX) 40 MG tablet Take 1 tablet (40 mg total) by mouth daily. 03/24/15  Yes Nishant Dhungel, MD  isosorbide mononitrate (IMDUR) 30 MG 24 hr tablet Take 1 tablet (30 mg total) by mouth daily. 03/24/15  Yes Nishant Dhungel, MD  levothyroxine (SYNTHROID, LEVOTHROID) 50 MCG tablet Take 50 mcg by mouth every morning.  01/16/15  Yes Historical Provider, MD   lisinopril (PRINIVIL,ZESTRIL) 5 MG tablet Take 1 tablet (5 mg total) by mouth daily. 03/24/15  Yes Nishant Dhungel, MD  ranitidine (ZANTAC) 150 MG tablet Take 150 mg by mouth daily.  01/14/15  Yes Historical Provider, MD  albuterol (PROVENTIL) (2.5 MG/3ML) 0.083% nebulizer solution Take 3 mLs (2.5 mg total) by nebulization every 4 (four) hours as needed for wheezing. 04/28/15   Geradine Girt, DO  capsicum (ZOSTRIX) 0.075 % topical cream Apply topically 2 (two) times daily. 03/02/15   Ripudeep Krystal Eaton, MD  feeding supplement, ENSURE ENLIVE, (ENSURE ENLIVE) LIQD Take 237 mLs by mouth 2 (two) times daily between meals. 03/02/15   Ripudeep Krystal Eaton, MD  oxymetazoline (AFRIN) 0.05 % nasal spray Place 1 spray into both nostrils 2 (two) times daily as needed (nose bleed). 03/02/15   Ripudeep Krystal Eaton, MD  tuberculin 5 UNIT/0.1ML injection Inject 0.1 mLs (5 Units total) into the skin once. 04/28/15   Geradine Girt, DO  vitamin B-12 1000 MCG tablet Take 1 tablet (1,000 mcg total) by mouth daily. 04/28/15   Jessica U Vann, DO   BP 141/74 mmHg  Pulse 102  Temp(Src) 102.6 F (39.2 C) (Rectal)  Resp 24  Ht 5\' 8"  (1.727 m)  Wt 180 lb (81.647 kg)  BMI 27.38 kg/m2  SpO2 97% Physical Exam  Constitutional: She is oriented to person, place, and time. She appears well-developed and well-nourished. No distress.  HENT:  Head: Normocephalic and atraumatic.  Mouth/Throat: Oropharynx is clear and moist.  Eyes: Pupils are equal, round, and reactive to light.  Neck: Normal range of motion. Neck supple.  Cardiovascular: Normal rate, regular rhythm and normal heart sounds.  Exam reveals no gallop and no friction rub.   No murmur heard. Pulmonary/Chest: Effort normal and breath sounds normal. No respiratory distress. She has no wheezes.  Abdominal: Soft. Bowel sounds are normal. She exhibits no distension. There is no tenderness.  Musculoskeletal: She exhibits edema.  Neurological: She is alert and oriented to person, place, and  time. She exhibits normal muscle tone. Coordination normal.  Skin: Skin is warm and dry. No rash noted. No erythema.  Nursing note and vitals reviewed.   ED Course  Procedures (including critical care time) Labs Review Labs Reviewed  URINALYSIS, ROUTINE W REFLEX MICROSCOPIC (NOT AT [facility] - Abnormal; Notable for the following:    Hgb urine dipstick TRACE (*)    Protein, ur 100 (*)    All other components within normal limits  COMPREHENSIVE METABOLIC PANEL - Abnormal; Notable for the following:    Creatinine, Ser 1.04 (*)    Total Protein 8.3 (*)    ALT 13 (*)  GFR calc non Af Amer 50 (*)    GFR calc Af Amer 58 (*)    All other components within normal limits  CBC WITH DIFFERENTIAL/PLATELET - Abnormal; Notable for the following:    Hemoglobin 11.0 (*)    HCT 35.2 (*)    RDW 15.7 (*)    All other components within normal limits  URINE CULTURE  CULTURE, BLOOD (ROUTINE X 2)  CULTURE, BLOOD (ROUTINE X 2)  URINE MICROSCOPIC-ADD ON  I-STAT CG4 LACTIC ACID, ED    Imaging Review Dg Chest 2 View  07/23/2015  CLINICAL DATA:  Generalized weakness EXAM: CHEST  2 VIEW COMPARISON:  04/25/2015 FINDINGS: Cardiomegaly again noted. Central mild vascular congestion without convincing pulmonary edema. Dual lead cardiac pacemaker is unchanged in position. Mild left basilar atelectasis. Thoracic spine osteopenia and mild degenerative changes. IMPRESSION: Central mild vascular congestion without pulmonary edema. Cardiomegaly again noted. Mild left basilar atelectasis. Dual lead cardiac pacemaker in place. Electronically Signed   By: Lahoma Crocker M.D.   On: 07/23/2015 18:48   I have personally reviewed and evaluated these images and lab results as part of my medical decision-making.   EKG Interpretation   Date/Time:  Friday July 23 2015 16:12:57 EDT Ventricular Rate:  100 PR Interval:  113 QRS Duration: 175 QT Interval:  404 QTC Calculation: 521 R Axis:   94 Text Interpretation:   Atrial-sensed ventricular-paced rhythm No further  analysis attempted due to paced rhythm Since last tracing rate faster  Baseline wander Confirmed by MILLER  MD, [place] ([id number]) on 07/23/2015  8:45:49 PM       Patient be admitted to the hospital further evaluation and care and fever of unknown origin.  Spoke with the [facility] will be down to admit the patient  Dalia Heading, PA-C 07/24/15 0018  Noemi Chapel, MD 07/25/15 2032

## 2015-07-23 NOTE — ED Notes (Signed)
Nurse drawing labs. 

## 2015-07-23 NOTE — ED Notes (Signed)
Pacemaker interrogated.  [employer] verified.

## 2015-07-23 NOTE — ED Notes (Signed)
Pt's contact----- [name] (son): tel# [phone]

## 2015-07-23 NOTE — ED Notes (Signed)
Patient returned from XRay

## 2015-07-23 NOTE — ED Notes (Signed)
Patient reports overall not feeling well.  Does not report any pain, N/V/D.  Patient is A&O to person and place.  Patient denies chest pain, HA, and SOB

## 2015-07-24 ENCOUNTER — Encounter (HOSPITAL_COMMUNITY): Payer: Self-pay

## 2015-07-24 DIAGNOSIS — Z85038 Personal history of other malignant neoplasm of large intestine: Secondary | ICD-10-CM | POA: Diagnosis not present

## 2015-07-24 DIAGNOSIS — Z95810 Presence of automatic (implantable) cardiac defibrillator: Secondary | ICD-10-CM | POA: Diagnosis not present

## 2015-07-24 DIAGNOSIS — E039 Hypothyroidism, unspecified: Secondary | ICD-10-CM | POA: Diagnosis not present

## 2015-07-24 DIAGNOSIS — F0390 Unspecified dementia without behavioral disturbance: Secondary | ICD-10-CM | POA: Diagnosis present

## 2015-07-24 DIAGNOSIS — G9340 Encephalopathy, unspecified: Secondary | ICD-10-CM | POA: Diagnosis not present

## 2015-07-24 DIAGNOSIS — Z933 Colostomy status: Secondary | ICD-10-CM | POA: Diagnosis not present

## 2015-07-24 DIAGNOSIS — E876 Hypokalemia: Secondary | ICD-10-CM | POA: Diagnosis not present

## 2015-07-24 DIAGNOSIS — R32 Unspecified urinary incontinence: Secondary | ICD-10-CM | POA: Diagnosis not present

## 2015-07-24 DIAGNOSIS — E785 Hyperlipidemia, unspecified: Secondary | ICD-10-CM | POA: Diagnosis not present

## 2015-07-24 DIAGNOSIS — Z8249 Family history of ischemic heart disease and other diseases of the circulatory system: Secondary | ICD-10-CM | POA: Diagnosis not present

## 2015-07-24 DIAGNOSIS — Z13820 Encounter for screening for osteoporosis: Secondary | ICD-10-CM | POA: Diagnosis not present

## 2015-07-24 DIAGNOSIS — I5022 Chronic systolic (congestive) heart failure: Secondary | ICD-10-CM | POA: Diagnosis not present

## 2015-07-24 DIAGNOSIS — I5023 Acute on chronic systolic (congestive) heart failure: Secondary | ICD-10-CM | POA: Diagnosis not present

## 2015-07-24 DIAGNOSIS — N183 Chronic kidney disease, stage 3 (moderate): Secondary | ICD-10-CM | POA: Diagnosis not present

## 2015-07-24 DIAGNOSIS — A419 Sepsis, unspecified organism: Secondary | ICD-10-CM | POA: Diagnosis present

## 2015-07-24 DIAGNOSIS — Z90710 Acquired absence of both cervix and uterus: Secondary | ICD-10-CM | POA: Diagnosis not present

## 2015-07-24 DIAGNOSIS — J449 Chronic obstructive pulmonary disease, unspecified: Secondary | ICD-10-CM | POA: Diagnosis not present

## 2015-07-24 DIAGNOSIS — D509 Iron deficiency anemia, unspecified: Secondary | ICD-10-CM | POA: Diagnosis not present

## 2015-07-24 DIAGNOSIS — Z9049 Acquired absence of other specified parts of digestive tract: Secondary | ICD-10-CM | POA: Diagnosis not present

## 2015-07-24 DIAGNOSIS — D631 Anemia in chronic kidney disease: Secondary | ICD-10-CM | POA: Diagnosis not present

## 2015-07-24 DIAGNOSIS — G9341 Metabolic encephalopathy: Secondary | ICD-10-CM | POA: Diagnosis not present

## 2015-07-24 DIAGNOSIS — N182 Chronic kidney disease, stage 2 (mild): Secondary | ICD-10-CM | POA: Diagnosis not present

## 2015-07-24 DIAGNOSIS — R509 Fever, unspecified: Secondary | ICD-10-CM | POA: Diagnosis present

## 2015-07-24 DIAGNOSIS — D638 Anemia in other chronic diseases classified elsewhere: Secondary | ICD-10-CM | POA: Diagnosis not present

## 2015-07-24 DIAGNOSIS — Z86711 Personal history of pulmonary embolism: Secondary | ICD-10-CM | POA: Diagnosis not present

## 2015-07-24 DIAGNOSIS — E1122 Type 2 diabetes mellitus with diabetic chronic kidney disease: Secondary | ICD-10-CM | POA: Diagnosis not present

## 2015-07-24 DIAGNOSIS — Z862 Personal history of diseases of the blood and blood-forming organs and certain disorders involving the immune mechanism: Secondary | ICD-10-CM

## 2015-07-24 DIAGNOSIS — I130 Hypertensive heart and chronic kidney disease with heart failure and stage 1 through stage 4 chronic kidney disease, or unspecified chronic kidney disease: Secondary | ICD-10-CM | POA: Diagnosis not present

## 2015-07-24 DIAGNOSIS — T501X5A Adverse effect of loop [high-ceiling] diuretics, initial encounter: Secondary | ICD-10-CM | POA: Diagnosis not present

## 2015-07-24 DIAGNOSIS — N189 Chronic kidney disease, unspecified: Secondary | ICD-10-CM | POA: Diagnosis not present

## 2015-07-24 DIAGNOSIS — I429 Cardiomyopathy, unspecified: Secondary | ICD-10-CM | POA: Diagnosis not present

## 2015-07-24 DIAGNOSIS — E860 Dehydration: Secondary | ICD-10-CM | POA: Diagnosis not present

## 2015-07-24 LAB — COMPREHENSIVE METABOLIC PANEL
ALT: 8 U/L — ABNORMAL LOW (ref 14–54)
AST: 17 U/L (ref 15–41)
Albumin: 3.3 g/dL — ABNORMAL LOW (ref 3.5–5.0)
Alkaline Phosphatase: 36 U/L — ABNORMAL LOW (ref 38–126)
Anion gap: 9 (ref 5–15)
BILIRUBIN TOTAL: 1 mg/dL (ref 0.3–1.2)
BUN: 16 mg/dL (ref 6–20)
CALCIUM: 8.9 mg/dL (ref 8.9–10.3)
CO2: 24 mmol/L (ref 22–32)
CREATININE: 1.06 mg/dL — AB (ref 0.44–1.00)
Chloride: 104 mmol/L (ref 101–111)
GFR calc Af Amer: 56 mL/min — ABNORMAL LOW (ref >60)
GFR, EST NON AFRICAN AMERICAN: 49 mL/min — AB (ref >60)
GLUCOSE: 79 mg/dL (ref 65–99)
POTASSIUM: 3.2 mmol/L — AB (ref 3.5–5.1)
Sodium: 137 mmol/L (ref 135–145)
TOTAL PROTEIN: 6.7 g/dL (ref 6.5–8.1)

## 2015-07-24 LAB — CBC
HEMATOCRIT: 31.3 % — AB (ref 36.0–46.0)
Hemoglobin: 9.6 g/dL — ABNORMAL LOW (ref 12.0–15.0)
MCH: 27.1 pg (ref 26.0–34.0)
MCHC: 30.7 g/dL (ref 30.0–36.0)
MCV: 88.4 fL (ref 78.0–100.0)
PLATELETS: 235 10*3/uL (ref 150–400)
RBC: 3.54 MIL/uL — ABNORMAL LOW (ref 3.87–5.11)
RDW: 15.7 % — AB (ref 11.5–15.5)
WBC: 6.4 10*3/uL (ref 4.0–10.5)

## 2015-07-24 LAB — INFLUENZA PANEL BY PCR (TYPE A & B)
H1N1 flu by pcr: NOT DETECTED
Influenza A By PCR: NEGATIVE
Influenza B By PCR: NEGATIVE

## 2015-07-24 LAB — MAGNESIUM: Magnesium: 1.6 mg/dL — ABNORMAL LOW (ref 1.7–2.4)

## 2015-07-24 LAB — MRSA PCR SCREENING: MRSA BY PCR: NEGATIVE

## 2015-07-24 LAB — PHOSPHORUS: PHOSPHORUS: 3.7 mg/dL (ref 2.5–4.6)

## 2015-07-24 LAB — TSH: TSH: 1.299 u[IU]/mL (ref 0.350–4.500)

## 2015-07-24 MED ORDER — FAMOTIDINE 20 MG PO TABS
10.0000 mg | ORAL_TABLET | Freq: Every day | ORAL | Status: DC
  Administered 2015-07-24 – 2015-07-29 (×6): 10 mg via ORAL
  Filled 2015-07-24 (×6): qty 1

## 2015-07-24 MED ORDER — GUAIFENESIN ER 600 MG PO TB12
600.0000 mg | ORAL_TABLET | Freq: Two times a day (BID) | ORAL | Status: DC
  Administered 2015-07-24 – 2015-07-29 (×11): 600 mg via ORAL
  Filled 2015-07-24 (×11): qty 1

## 2015-07-24 MED ORDER — LEVOTHYROXINE SODIUM 50 MCG PO TABS
50.0000 ug | ORAL_TABLET | Freq: Every day | ORAL | Status: DC
  Administered 2015-07-24 – 2015-07-29 (×6): 50 ug via ORAL
  Filled 2015-07-24 (×6): qty 1

## 2015-07-24 MED ORDER — FERROUS SULFATE 325 (65 FE) MG PO TABS
325.0000 mg | ORAL_TABLET | ORAL | Status: DC
  Administered 2015-07-25 – 2015-07-29 (×5): 325 mg via ORAL
  Filled 2015-07-24 (×5): qty 1

## 2015-07-24 MED ORDER — ONDANSETRON HCL 4 MG PO TABS
4.0000 mg | ORAL_TABLET | Freq: Four times a day (QID) | ORAL | Status: DC | PRN
  Administered 2015-07-28 – 2015-07-29 (×2): 4 mg via ORAL
  Filled 2015-07-24 (×2): qty 1

## 2015-07-24 MED ORDER — HYDROCODONE-ACETAMINOPHEN 5-325 MG PO TABS
1.0000 | ORAL_TABLET | ORAL | Status: DC | PRN
  Administered 2015-07-24 – 2015-07-25 (×3): 1 via ORAL
  Filled 2015-07-24 (×3): qty 1

## 2015-07-24 MED ORDER — ONDANSETRON HCL 4 MG/2ML IJ SOLN: 4.0000 mg | Freq: Four times a day (QID) | INTRAMUSCULAR | Status: DC | PRN

## 2015-07-24 MED ORDER — ISOSORBIDE MONONITRATE ER 30 MG PO TB24
30.0000 mg | ORAL_TABLET | Freq: Every day | ORAL | Status: DC
  Administered 2015-07-24 – 2015-07-29 (×6): 30 mg via ORAL
  Filled 2015-07-24 (×6): qty 1

## 2015-07-24 MED ORDER — MOMETASONE FURO-FORMOTEROL FUM 200-5 MCG/ACT IN AERO
2.0000 | INHALATION_SPRAY | Freq: Two times a day (BID) | RESPIRATORY_TRACT | Status: DC
  Administered 2015-07-24 – 2015-07-29 (×10): 2 via RESPIRATORY_TRACT
  Filled 2015-07-24 (×2): qty 8.8

## 2015-07-24 MED ORDER — SODIUM CHLORIDE 0.9 % IJ SOLN
3.0000 mL | Freq: Two times a day (BID) | INTRAMUSCULAR | Status: DC
  Administered 2015-07-24 – 2015-07-29 (×10): 3 mL via INTRAVENOUS

## 2015-07-24 MED ORDER — ACETAMINOPHEN 325 MG PO TABS
650.0000 mg | ORAL_TABLET | Freq: Four times a day (QID) | ORAL | Status: DC | PRN
  Administered 2015-07-25 – 2015-07-27 (×4): 650 mg via ORAL
  Filled 2015-07-24 (×4): qty 2

## 2015-07-24 MED ORDER — CARVEDILOL 6.25 MG PO TABS
6.2500 mg | ORAL_TABLET | Freq: Two times a day (BID) | ORAL | Status: DC
  Administered 2015-07-24 – 2015-07-29 (×11): 6.25 mg via ORAL
  Filled 2015-07-24 (×11): qty 1

## 2015-07-24 MED ORDER — SODIUM CHLORIDE 0.9 % IV SOLN: INTRAVENOUS | Status: DC

## 2015-07-24 MED ORDER — MAGNESIUM SULFATE 2 GM/50ML IV SOLN
2.0000 g | Freq: Once | INTRAVENOUS | Status: AC
  Administered 2015-07-24: 2 g via INTRAVENOUS
  Filled 2015-07-24: qty 50

## 2015-07-24 MED ORDER — ACETAMINOPHEN 650 MG RE SUPP: 650.0000 mg | Freq: Four times a day (QID) | RECTAL | Status: DC | PRN

## 2015-07-24 MED ORDER — ASPIRIN EC 81 MG PO TBEC
81.0000 mg | DELAYED_RELEASE_TABLET | Freq: Every day | ORAL | Status: DC
  Administered 2015-07-24 – 2015-07-29 (×6): 81 mg via ORAL
  Filled 2015-07-24 (×6): qty 1

## 2015-07-24 MED ORDER — SODIUM CHLORIDE 0.9 % IV SOLN
INTRAVENOUS | Status: AC
  Administered 2015-07-24: 03:00:00 via INTRAVENOUS

## 2015-07-24 MED ORDER — FUROSEMIDE 40 MG PO TABS
40.0000 mg | ORAL_TABLET | Freq: Every day | ORAL | Status: DC
  Administered 2015-07-24 – 2015-07-29 (×6): 40 mg via ORAL
  Filled 2015-07-24 (×6): qty 1

## 2015-07-24 MED ORDER — ATORVASTATIN CALCIUM 10 MG PO TABS
20.0000 mg | ORAL_TABLET | Freq: Every day | ORAL | Status: DC
  Administered 2015-07-24 – 2015-07-28 (×5): 20 mg via ORAL
  Filled 2015-07-24 (×5): qty 2

## 2015-07-24 MED ORDER — ENOXAPARIN SODIUM 40 MG/0.4ML ~~LOC~~ SOLN
40.0000 mg | SUBCUTANEOUS | Status: DC
  Administered 2015-07-24 – 2015-07-29 (×6): 40 mg via SUBCUTANEOUS
  Filled 2015-07-24 (×6): qty 0.4

## 2015-07-24 NOTE — Progress Notes (Addendum)
Patient ID: [name], female   DOB: May 18, 1934, 81 y.o.   MRN: [id number]  [facility] PROGRESS NOTE  [name] ENI:[id number] DOB: 1933-11-04 DOA: 07/23/2015 PCP: No primary care provider on file.   Brief narrative:    81 y.o. female with COPD, DM, HLD, systolic CHF with last EF 35 - 40% in June 2016, colon cancer and status post resection and now with colostomy, presented from SNF for evaluation of confusion and urinary incontinence, unable to ambulate with walker. Pt was unable to provide much history at the time of the admission due to confusion.   In ED, pt had T 102.41F, HR > 100, TR[facility] asked to admit for evaluation and management of presumptive sepsis of unclear source. Pt was started on vancomycin and zosyn.   Assessment/Plan:    Principal Problem:   Sepsis ([place]) - unclear etiology at this time - urine and blood cultures so far negative - will continue vanc and zosyn day #2 and possibly d/c if no further signs of an infectious etiology - WBC in AM  Active Problems:   Acute encephalopathy, metabolic  - secondary to the above and from dehydration  - now at baseline mental status     Acute on chronic systolic heart failure (HCC) - mild crackles at bases  - stop IVF, resume home Lasix - monitor daily weights, strict I/O - weight 81 kg this AM    Chronic renal insufficiency, stage II (mild) - Cr is at baseline  - BMP in AM    COPD (chronic obstructive pulmonary disease) (HCC) - no wheezing this AM    History of iron deficiency anemia - Hg overall stable - CBC in AM    HTN (hypertension) - reasonable inpatient control     Colostomy in place [facility])    ICD in place - interrogated and stable     Dementia - at baseline     Hypokalemia with Hypomagnesemia - supplemented this AM  DVT prophylaxis - Lovenox SQ  Code Status: Full.  Family Communication:  plan of care discussed with the patient Disposition Plan: Home when stable.   IV access:   Peripheral IV  Procedures and diagnostic studies:    Dg Chest 2 View 07/23/2015  Central mild vascular congestion without pulmonary edema. Cardiomegaly again noted. Mild left basilar atelectasis. Dual lead cardiac pacemaker in place.  Medical Consultants:  None  Other Consultants:  None  IAnti-Infectives:   Vancomycin 10/15 --> Zosyn 10/15 -->  Faye Ramsay, MD  [facility] Pager [phone]  If 7PM-7AM, please contact night-coverage [email or web address] Password [email or web address] 07/24/2015, 5:44 PM  HPI/Subjective: No events overnight.   Objective: Filed Vitals:   07/24/15 0124 07/24/15 0507 07/24/15 0844 07/24/15 1500  BP: 157/77 156/72 153/64 135/67  Pulse: 101 96 86 91  Temp: 99.8 F (37.7 C) 98.5 F (36.9 C) 99 F (37.2 C) 99 F (37.2 C)  TempSrc: Oral Oral Oral Oral  Resp: 22   20  Height:      Weight:      SpO2: 96% 100%  98%    Intake/Output Summary (Last 24 hours) at 07/24/15 1744 Last data filed at 07/24/15 1310  Gross per 24 hour  Intake 1193.75 ml  Output      0 ml  Net 1193.75 ml    Exam:   General:  Pt is alert, not in acute distress  Cardiovascular: Regular rate and rhythm, no rubs, no gallops  Respiratory: Clear to auscultation bilaterally, no wheezing, no crackles,  no rhonchi  Abdomen: Soft, non tender, non distended, bowel sounds present, no guarding  Extremities: No edema, pulses DP and PT palpable bilaterally  Data Reviewed: Basic Metabolic Panel:  Recent Labs Lab 07/23/15 1708 07/24/15 0425  NA 141 137  K 3.9 3.2*  CL 104 104  CO2 26 24  GLUCOSE 87 79  BUN 17 16  CREATININE 1.04* 1.06*  CALCIUM 10.0 8.9  MG  --  1.6*  PHOS  --  3.7   Liver Function Tests:  Recent Labs Lab 07/23/15 1708 07/24/15 0425  AST 22 17  ALT 13* 8*  ALKPHOS 49 36*  BILITOT 0.8 1.0  PROT 8.3* 6.7  ALBUMIN 4.1 3.3*   CBC:  Recent Labs Lab 07/23/15 1708 07/24/15 0425  WBC 6.4 6.4  NEUTROABS 3.2  --   HGB 11.0* 9.6*  HCT 35.2* 31.3*  MCV  88.9 88.4  PLT 278 235   Recent Results (from the past 240 hour(s))  Urine culture     Status: None (Preliminary result)   Collection Time: 07/23/15  4:10 PM  Result Value Ref Range Status   Specimen Description URINE, CLEAN CATCH  Final   Special Requests NONE  Final   Culture   Final    NO GROWTH < 12 HOURS Performed at [facility]    Report Status PENDING  Incomplete  Blood Culture (routine x 2)     Status: None (Preliminary result)   Collection Time: 07/23/15  9:05 PM  Result Value Ref Range Status   Specimen Description BLOOD BLOOD LEFT HAND  Final   Special Requests BOTTLES DRAWN AEROBIC AND ANAEROBIC 5ML  Final   Culture   Final    NO GROWTH < 24 HOURS Performed at [facility]    Report Status PENDING  Incomplete  Blood Culture (routine x 2)     Status: None (Preliminary result)   Collection Time: 07/23/15  9:08 PM  Result Value Ref Range Status   Specimen Description BLOOD LEFT ANTECUBITAL  Final   Special Requests BOTTLES DRAWN AEROBIC AND ANAEROBIC 5ML  Final   Culture   Final    NO GROWTH < 24 HOURS Performed at [facility]    Report Status PENDING  Incomplete  MRSA PCR Screening     Status: None   Collection Time: 07/24/15  4:58 AM  Result Value Ref Range Status   MRSA by PCR NEGATIVE NEGATIVE Final    Comment:        The GeneXpert MRSA Assay (FDA approved for NASAL specimens only), is one component of a comprehensive MRSA colonization surveillance program. It is not intended to diagnose MRSA infection nor to guide or monitor treatment for MRSA infections.      Scheduled Meds: . aspirin EC  81 mg Oral Daily  . atorvastatin  20 mg Oral q1800  . carvedilol  6.25 mg Oral BID WC  . enoxaparin (LOVENOX) injection  40 mg Subcutaneous Q24H  . famotidine  10 mg Oral Daily  . guaiFENesin  600 mg Oral BID  . isosorbide mononitrate  30 mg Oral Daily  . levothyroxine  50 mcg Oral QAC breakfast  . mometasone-formoterol  2 puff  Inhalation BID  . piperacillin-tazobactam (ZOSYN)  IV  3.375 g Intravenous Q8H  . sodium chloride  3 mL Intravenous Q12H  . vancomycin  750 mg Intravenous Q12H   Continuous Infusions:

## 2015-07-25 LAB — CBC
HEMATOCRIT: 29.6 % — AB (ref 36.0–46.0)
HEMOGLOBIN: 9.2 g/dL — AB (ref 12.0–15.0)
MCH: 27.7 pg (ref 26.0–34.0)
MCHC: 31.1 g/dL (ref 30.0–36.0)
MCV: 89.2 fL (ref 78.0–100.0)
Platelets: 211 10*3/uL (ref 150–400)
RBC: 3.32 MIL/uL — ABNORMAL LOW (ref 3.87–5.11)
RDW: 15.9 % — AB (ref 11.5–15.5)
WBC: 5 10*3/uL (ref 4.0–10.5)

## 2015-07-25 LAB — BASIC METABOLIC PANEL
Anion gap: 8 (ref 5–15)
BUN: 19 mg/dL (ref 6–20)
CHLORIDE: 104 mmol/L (ref 101–111)
CO2: 28 mmol/L (ref 22–32)
CREATININE: 1.27 mg/dL — AB (ref 0.44–1.00)
Calcium: 9 mg/dL (ref 8.9–10.3)
GFR calc Af Amer: 45 mL/min — ABNORMAL LOW (ref >60)
GFR, EST NON AFRICAN AMERICAN: 39 mL/min — AB (ref >60)
GLUCOSE: 96 mg/dL (ref 65–99)
Potassium: 3.3 mmol/L — ABNORMAL LOW (ref 3.5–5.1)
Sodium: 140 mmol/L (ref 135–145)

## 2015-07-25 LAB — URINE CULTURE: Culture: NO GROWTH

## 2015-07-25 LAB — MAGNESIUM: MAGNESIUM: 2.1 mg/dL (ref 1.7–2.4)

## 2015-07-25 MED ORDER — POTASSIUM CHLORIDE CRYS ER 20 MEQ PO TBCR
40.0000 meq | EXTENDED_RELEASE_TABLET | Freq: Once | ORAL | Status: AC
  Administered 2015-07-25: 40 meq via ORAL
  Filled 2015-07-25: qty 2

## 2015-07-25 MED ORDER — LEVOFLOXACIN 750 MG PO TABS
750.0000 mg | ORAL_TABLET | ORAL | Status: DC
  Administered 2015-07-27 – 2015-07-29 (×2): 750 mg via ORAL
  Filled 2015-07-25 (×2): qty 1

## 2015-07-25 MED ORDER — LEVOFLOXACIN 500 MG PO TABS: 500.0000 mg | ORAL_TABLET | Freq: Every day | ORAL | Status: DC

## 2015-07-25 MED ORDER — HYDROCODONE-ACETAMINOPHEN 5-325 MG PO TABS: 1.0000 | ORAL_TABLET | ORAL | Status: DC | PRN

## 2015-07-25 MED ORDER — LEVOFLOXACIN 750 MG PO TABS: 750.0000 mg | ORAL_TABLET | ORAL | Status: DC

## 2015-07-25 NOTE — Evaluation (Signed)
Physical Therapy Evaluation Patient Details Name: [name] MRN: [id number] DOB: [birth year] Today's Date: 07/25/2015   History of Present Illness  81 y.o. female with COPD, DM, HLD, systolic CHF with last EF 56 - 40% in June 2016, colon cancer and status post resection and now with colostomy, presented from facility for evaluation of confusion and urinary incontinence, unable to ambulate with walker PMHx: dementia  Clinical Impression  Pt admitted with above diagnosis. Pt currently with functional limitations due to the deficits listed below (see PT Problem List).  Pt will benefit from skilled PT to increase their independence and safety with mobility to allow discharge to the venue listed below.  Pt cooperative with PT, moves very slowly and slow to respond but appropriate throughout, will follow      Follow Up Recommendations SNF (vs HHPT at ALF depending on progress/ALF support)    Equipment Recommendations  None recommended by PT    Recommendations for Other Services       Precautions / Restrictions Precautions Precautions: Fall Precaution Comments: colostomy      Mobility  Bed Mobility Overal bed mobility: Needs Assistance Bed Mobility: Supine to Sit     Supine to sit: HOB elevated;Mod assist     General bed mobility comments: incr time, assist for LEs and trunk to come forward, cues for technique  Transfers Overall transfer level: Needs assistance Equipment used: Rolling walker (2 wheeled);None Transfers: Sit to/from [employer] to Stand: Min assist Stand pivot transfers: Mod assist       General transfer comment: incr time, cues for technique  Ambulation/Gait             General Gait Details: NT with +1 assist  Stairs            Wheelchair Mobility    Modified Rankin (Stroke Patients Only)       Balance Overall balance assessment: Needs assistance   Sitting balance-Leahy Scale: Fair       Standing  balance-Leahy Scale: Poor                               Pertinent Vitals/Pain Pain Assessment: No/denies pain    Home Living Family/patient expects to be discharged to:: Skilled nursing facility                 Additional Comments: pt from ALF    Prior Function Level of Independence: Needs assistance   Gait / Transfers Assistance Needed: States she ambulates with assist and RW per previous notes in chart--pt reports she is mod I this adm--?  ADL's / Homemaking Assistance Needed: assisted for LB bathing and dressing and for toileting, could perform grooming, self feeding (per previous notes)        Hand Dominance        Extremity/Trunk Assessment   Upper Extremity Assessment: Generalized weakness;Defer to OT evaluation           Lower Extremity Assessment: Generalized weakness         Communication   Communication: No difficulties  Cognition Arousal/Alertness: Awake/alert Behavior During Therapy: WFL for tasks assessed/performed Overall Cognitive Status: History of cognitive impairments - at baseline Area of Impairment: Following commands;Problem solving       Following Commands: Follows one step commands with increased time     Problem Solving: Decreased initiation;Requires verbal cues;Requires tactile cues;Slow processing      General Comments  Exercises        Assessment/Plan    PT Assessment Patient needs continued PT services  PT Diagnosis Difficulty walking   PT Problem List Decreased strength;Decreased activity tolerance;Decreased balance;Decreased mobility  PT Treatment Interventions DME instruction;Gait training;Functional mobility training;Therapeutic activities;Therapeutic exercise   PT Goals (Current goals can be found in the Care Plan section) Acute Rehab PT Goals Patient Stated Goal: none stated PT Goal Formulation: With patient Time For Goal Achievement: 08/01/15 Potential to Achieve Goals: Good     Frequency Min 3X/week   Barriers to discharge        Co-evaluation               End of Session Equipment Utilized During Treatment: Gait belt Activity Tolerance: Patient tolerated treatment well Patient left: in chair;with call bell/phone within reach;with chair alarm set Nurse Communication: Mobility status         Time: 9381-8299 PT Time Calculation (min) (ACUTE ONLY): 22 min   Charges:   PT Evaluation $Initial PT Evaluation Tier I: 1 Procedure     PT G CodesKenyon Ana 23-Aug-2015, 3:40 PM

## 2015-07-25 NOTE — Progress Notes (Signed)
Patient ID: [name], female   DOB: [birth year], 81 y.o.   MRN: [id number]  [facility] PROGRESS NOTE  CHELCEE KORPI XBL:[id number] DOB: [birth year] DOA: 07/23/2015 PCP: No primary care provider on file.   Brief narrative:    81 y.o. female with COPD, DM, HLD, systolic CHF with last EF 35 - 40% in June 2016, colon cancer and status post resection and now with colostomy, presented from SNF for evaluation of confusion and urinary incontinence, unable to ambulate with walker. Pt was unable to provide much history at the time of the admission due to confusion.   In ED, pt had T 102.32F, HR > 100, TRH asked to admit for evaluation and management of presumptive sepsis of unclear source. Pt was started on vancomycin and zosyn.   Assessment/Plan:    Principal Problem:   Sepsis ([place]) - unclear etiology at this time, Tmax over the past 24 hours 99.5 - urine and blood cultures so far negative - transition from Vanc and Zosyn to Levaquin and continue to monitor  - WBC in AM  Active Problems:   Acute encephalopathy, metabolic  - secondary to the above and from dehydration  - now at baseline mental status, NAD and follows commands appropriately     Acute on chronic systolic heart failure (HCC) - continue with home regimen  Lasix - monitor daily weights, strict I/O - weight remains stable at 81 kg this AM    Chronic renal insufficiency, stage II (mild) - Cr is trending up, will stop Vancomycin and will continue with Lasix but continue with Lasix  - BMP in AM    COPD (chronic obstructive pulmonary disease) (HCC) - no wheezing this AM    History of iron deficiency anemia - Hg drop since admission likely dilutional from IVF  - no signs of bleeding  - CBC in AM    HTN (hypertension) - reasonable inpatient control     Colostomy in place [facility])    ICD in place - interrogated and stable     Dementia - at baseline     Hypokalemia with Hypomagnesemia - supplemented, Mg pending  this AM - BMP in AM  DVT prophylaxis - Lovenox SQ  Code Status: Full.  Family Communication:  plan of care discussed with the patient Disposition Plan: SNF in AM  IV access:  Peripheral IV  Procedures and diagnostic studies:    Dg Chest 2 View 07/23/2015  Central mild vascular congestion without pulmonary edema. Cardiomegaly again noted. Mild left basilar atelectasis. Dual lead cardiac pacemaker in place.  Medical Consultants:  None  Other Consultants:  None  IAnti-Infectives:   Vancomycin 10/14 --> 10/16 Zosyn 10/14 --> 10/16 Levaquin 10/16 -->  Faye Ramsay, MD  [facility] Pager [phone]  If 7PM-7AM, please contact night-coverage [email or web address] Password [email or web address] 07/25/2015, 11:12 AM  HPI/Subjective: No events overnight.   Objective: Filed Vitals:   07/25/15 0138 07/25/15 0432 07/25/15 0826 07/25/15 0947  BP: 140/73 131/65  154/68  Pulse: 87 89  88  Temp: 99.3 F (37.4 C) 99.1 F (37.3 C)  98.7 F (37.1 C)  TempSrc: Oral Oral  Oral  Resp: 16 20    Height:      Weight:      SpO2: 99% 98% 98% 99%    Intake/Output Summary (Last 24 hours) at 07/25/15 1112 Last data filed at 07/25/15 0300  Gross per 24 hour  Intake    760 ml  Output      0 ml  Net    760 ml    Exam:   General:  Pt is alert, not in acute distress  Cardiovascular: Regular rate and rhythm, no rubs, no gallops  Respiratory: Clear to auscultation bilaterally, no wheezing, no crackles, no rhonchi  Abdomen: Soft, non tender, non distended, bowel sounds present, no guarding  Extremities: No edema, pulses DP and PT palpable bilaterally  Data Reviewed: Basic Metabolic Panel:  Recent Labs Lab 07/23/15 1708 07/24/15 0425 07/25/15 0525  NA 141 137 140  K 3.9 3.2* 3.3*  CL 104 104 104  CO2 26 24 28   GLUCOSE 87 79 96  BUN 17 16 19   CREATININE 1.04* 1.06* 1.27*  CALCIUM 10.0 8.9 9.0  MG  --  1.6*  --   PHOS  --  3.7  --    Liver Function Tests:  Recent Labs Lab 07/23/15 1708  07/24/15 0425  AST 22 17  ALT 13* 8*  ALKPHOS 49 36*  BILITOT 0.8 1.0  PROT 8.3* 6.7  ALBUMIN 4.1 3.3*   CBC:  Recent Labs Lab 07/23/15 1708 07/24/15 0425 07/25/15 0525  WBC 6.4 6.4 5.0  NEUTROABS 3.2  --   --   HGB 11.0* 9.6* 9.2*  HCT 35.2* 31.3* 29.6*  MCV 88.9 88.4 89.2  PLT 278 235 211   Recent Results (from the past 240 hour(s))  Urine culture     Status: None (Preliminary result)   Collection Time: 07/23/15  4:10 PM  Result Value Ref Range Status   Specimen Description URINE, CLEAN CATCH  Final   Special Requests NONE  Final   Culture   Final    NO GROWTH < 12 HOURS Performed at [facility]    Report Status PENDING  Incomplete  Blood Culture (routine x 2)     Status: None (Preliminary result)   Collection Time: 07/23/15  9:05 PM  Result Value Ref Range Status   Specimen Description BLOOD BLOOD LEFT HAND  Final   Special Requests BOTTLES DRAWN AEROBIC AND ANAEROBIC 5ML  Final   Culture   Final    NO GROWTH < 24 HOURS Performed at [facility]    Report Status PENDING  Incomplete  Blood Culture (routine x 2)     Status: None (Preliminary result)   Collection Time: 07/23/15  9:08 PM  Result Value Ref Range Status   Specimen Description BLOOD LEFT ANTECUBITAL  Final   Special Requests BOTTLES DRAWN AEROBIC AND ANAEROBIC 5ML  Final   Culture   Final    NO GROWTH < 24 HOURS Performed at [facility]    Report Status PENDING  Incomplete  MRSA PCR Screening     Status: None   Collection Time: 07/24/15  4:58 AM  Result Value Ref Range Status   MRSA by PCR NEGATIVE NEGATIVE Final    Comment:        The GeneXpert MRSA Assay (FDA approved for NASAL specimens only), is one component of a comprehensive MRSA colonization surveillance program. It is not intended to diagnose MRSA infection nor to guide or monitor treatment for MRSA infections.      Scheduled Meds: . aspirin EC  81 mg Oral Daily  . atorvastatin  20 mg Oral q1800  .  carvedilol  6.25 mg Oral BID WC  . enoxaparin (LOVENOX) injection  40 mg Subcutaneous Q24H  . famotidine  10 mg Oral Daily  . ferrous sulfate  325 mg Oral BH-q7a  . furosemide  40 mg  Oral Daily  . guaiFENesin  600 mg Oral BID  . isosorbide mononitrate  30 mg Oral Daily  . levothyroxine  50 mcg Oral QAC breakfast  . mometasone-formoterol  2 puff Inhalation BID  . piperacillin-tazobactam (ZOSYN)  IV  3.375 g Intravenous Q8H  . sodium chloride  3 mL Intravenous Q12H  . vancomycin  750 mg Intravenous Q12H   Continuous Infusions:

## 2015-07-26 ENCOUNTER — Encounter (HOSPITAL_COMMUNITY): Payer: Self-pay | Admitting: Radiology

## 2015-07-26 ENCOUNTER — Inpatient Hospital Stay (HOSPITAL_COMMUNITY): Payer: Medicare HMO

## 2015-07-26 DIAGNOSIS — J441 Chronic obstructive pulmonary disease with (acute) exacerbation: Secondary | ICD-10-CM

## 2015-07-26 DIAGNOSIS — Z862 Personal history of diseases of the blood and blood-forming organs and certain disorders involving the immune mechanism: Secondary | ICD-10-CM

## 2015-07-26 LAB — BASIC METABOLIC PANEL
ANION GAP: 9 (ref 5–15)
BUN: 20 mg/dL (ref 6–20)
CO2: 26 mmol/L (ref 22–32)
Calcium: 9 mg/dL (ref 8.9–10.3)
Chloride: 103 mmol/L (ref 101–111)
Creatinine, Ser: 1.24 mg/dL — ABNORMAL HIGH (ref 0.44–1.00)
GFR calc Af Amer: 47 mL/min — ABNORMAL LOW (ref >60)
GFR, EST NON AFRICAN AMERICAN: 40 mL/min — AB (ref >60)
GLUCOSE: 87 mg/dL (ref 65–99)
POTASSIUM: 3.6 mmol/L (ref 3.5–5.1)
SODIUM: 138 mmol/L (ref 135–145)

## 2015-07-26 LAB — MAGNESIUM: MAGNESIUM: 1.9 mg/dL (ref 1.7–2.4)

## 2015-07-26 LAB — CBC
HCT: 27.8 % — ABNORMAL LOW (ref 36.0–46.0)
Hemoglobin: 8.5 g/dL — ABNORMAL LOW (ref 12.0–15.0)
MCH: 26.8 pg (ref 26.0–34.0)
MCHC: 30.6 g/dL (ref 30.0–36.0)
MCV: 87.7 fL (ref 78.0–100.0)
PLATELETS: 199 10*3/uL (ref 150–400)
RBC: 3.17 MIL/uL — AB (ref 3.87–5.11)
RDW: 15.4 % (ref 11.5–15.5)
WBC: 4.5 10*3/uL (ref 4.0–10.5)

## 2015-07-26 MED ORDER — INFLUENZA VAC SPLIT QUAD 0.5 ML IM SUSY
0.5000 mL | PREFILLED_SYRINGE | INTRAMUSCULAR | Status: AC
  Administered 2015-07-27: 0.5 mL via INTRAMUSCULAR
  Filled 2015-07-26 (×2): qty 0.5

## 2015-07-26 MED ORDER — LEVALBUTEROL HCL 0.63 MG/3ML IN NEBU: 0.6300 mg | INHALATION_SOLUTION | Freq: Four times a day (QID) | RESPIRATORY_TRACT | Status: DC | PRN

## 2015-07-26 MED ORDER — TECHNETIUM TC 99M DIETHYLENETRIAME-PENTAACETIC ACID: 33.0000 | Freq: Once | INTRAVENOUS | Status: DC | PRN

## 2015-07-26 MED ORDER — LEVALBUTEROL HCL 0.63 MG/3ML IN NEBU
0.6300 mg | INHALATION_SOLUTION | Freq: Once | RESPIRATORY_TRACT | Status: AC
  Administered 2015-07-26: 0.63 mg via RESPIRATORY_TRACT
  Filled 2015-07-26: qty 3

## 2015-07-26 MED ORDER — TECHNETIUM TO 99M ALBUMIN AGGREGATED
4.0000 | Freq: Once | INTRAVENOUS | Status: AC | PRN
  Administered 2015-07-26: 4 via INTRAVENOUS

## 2015-07-26 NOTE — Evaluation (Signed)
Occupational Therapy Evaluation Patient Details Name: BRICEIDA RASBERRY MRN: [id number] DOB: [birth year] Today's Date: 07/26/2015    History of Present Illness 81 y.o. female with COPD, DM, HLD, systolic CHF with last EF 42 - 40% in June 2016, colon cancer and status post resection and now with colostomy, presented from SNF for evaluation of confusion and urinary incontinence, unable to ambulate with walker   Clinical Impression   Pt admitted with confusion. Pt currently with functional limitations due to the deficits listed below (see OT Problem List). Pt will benefit from skilled OT to increase their safety and independence with ADL and functional mobility for ADL to facilitate discharge to venue listed below.      Follow Up Recommendations  SNF    Equipment Recommendations  None recommended by OT       Precautions / Restrictions Precautions Precautions: Fall Precaution Comments: colostomy      Mobility Bed Mobility Overal bed mobility: Needs Assistance Bed Mobility: Supine to Sit;Sit to Supine     Supine to sit: HOB elevated;Max assist Sit to supine: Max assist   General bed mobility comments: incr time, assist for LEs and trunk to come forward, cues for technique  Transfers Overall transfer level: Needs assistance Equipment used: None;1 person hand held assist Transfers: Sit to/from Stand Sit to Stand: Mod assist         General transfer comment: incr time, cues for technique    Balance                                            ADL Overall ADL's : Needs assistance/impaired Eating/Feeding: Maximal assistance;Sitting Eating/Feeding Details (indicate cue type and reason): pt sat EOB for lunch and needed max A. Pt very tired and stated she was not interested in eating after 2 bites Grooming: Minimal assistance;Sitting               Lower Body Dressing: Maximal assistance;Sit to/from stand       Toileting- Water quality scientist and  Hygiene: Maximal assistance;Sit to/from stand;Cueing for sequencing;Cueing for safety       Functional mobility during ADLs: Moderate assistance;Cueing for safety;Cueing for sequencing       Vision     Perception      Extremity/Trunk Assessment Upper Extremity Assessment Upper Extremity Assessment: Generalized weakness           Communication Communication Communication: No difficulties   Cognition Arousal/Alertness: Awake/alert Behavior During Therapy: WFL for tasks assessed/performed Overall Cognitive Status: No family/caregiver present to determine baseline cognitive functioning         Following Commands: Follows one step commands with increased time     Problem Solving: Requires verbal cues                Home Living Family/patient expects to be discharged to:: Skilled nursing facility                                 Additional Comments: pt from ALF      Prior Functioning/Environment Level of Independence: Needs assistance  Gait / Transfers Assistance Needed: States she ambulates with assist and RW per previous notes in chart--pt reports she is mod I this adm--? ADL's / Homemaking Assistance Needed: assisted for LB bathing and dressing and for toileting, could perform grooming, self  feeding (per previous notes)        OT Diagnosis: Generalized weakness   OT Problem List: Decreased strength;Decreased activity tolerance;Decreased safety awareness;Impaired balance (sitting and/or standing)   OT Treatment/Interventions: Self-care/ADL training;DME and/or AE instruction;Patient/family education    OT Goals(Current goals can be found in the care plan section) Acute Rehab OT Goals Patient Stated Goal: none stated Time For Goal Achievement: 08/09/15  OT Frequency: Min 2X/week              End of Session Nurse Communication: Mobility status  Activity Tolerance: Patient limited by fatigue Patient left: in bed;with call bell/phone  within reach   Time: 7209-4709 OT Time Calculation (min): 17 min Charges:  OT General Charges $OT Visit: 1 Procedure OT Evaluation $Initial OT Evaluation Tier I: 1 Procedure G-Codes:    Payton Mccallum D 07-31-2015, 2:04 PM

## 2015-07-26 NOTE — Progress Notes (Addendum)
Patient ID: [name], female   DOB: 13-Feb-1934, 80 y.o.   MRN: [id number]  [facility] PROGRESS NOTE  MEKAYLA SOMAN QAS:[id number] DOB: 06/10/1934 DOA: 07/23/2015 PCP: No primary care provider on file.   Brief narrative:    81 y.o. female with COPD, DM, HLD, systolic CHF with last EF 35 - 40% in June 2016, colon cancer and status post resection and now with colostomy, presented from SNF for evaluation of confusion and urinary incontinence, unable to ambulate with walker. Pt was unable to provide much history at the time of the admission due to confusion.   In ED, pt had T 102.52F, HR > 100, TRH asked to admit for evaluation and management of presumptive sepsis of unclear source. Pt was started on vancomycin and zosyn.   Assessment/Plan:    Principal Problem:   Sepsis ([place]) - unclear etiology at this time, Tmax over the past 24 hours 101.5  - urine and blood cultures so far negative - transition from Vanc and Zosyn to [place] on 10/16 - CT chest requested as pt wheezing this AM and to rule out underlying PNA  - WBC remain WNL   Active Problems:   Acute encephalopathy, metabolic  - secondary to the above and from dehydration  - now at baseline mental status, NAD and follows commands appropriately     Acute on chronic systolic heart failure (HCC) - continue with home regimen  Lasix - monitor daily weights, strict I/O - weight up since admission from 81 --> 83 kg    Chronic renal insufficiency, stage II (mild) - Cr unchanged in the past 24 hours - continue with same does Lasix     Acute COPD (chronic obstructive pulmonary disease) (HCC) - wheezing this AM - CT chest requested for evaluation - provide BD's as needed  - continue with empiric Levaquin as noted above     History of iron deficiency anemia - Hg drop since admission likely dilutional from IVF  - no signs of bleeding  - CBC in AM    HTN (hypertension) - reasonable inpatient control     Colostomy in  place [facility])    ICD in place - interrogated and stable     Dementia - at baseline     Hypokalemia with Hypomagnesemia - supplemented, WNL this AM - Mg is WNL - BMP in AM  DVT prophylaxis - Lovenox SQ  Code Status: Full.  Family Communication:  plan of care discussed with the patient Disposition Plan: SNF when afebrile and respiratory status stable   IV access:  Peripheral IV  Procedures and diagnostic studies:    Dg Chest 2 View 07/23/2015  Central mild vascular congestion without pulmonary edema. Cardiomegaly again noted. Mild left basilar atelectasis. Dual lead cardiac pacemaker in place.  Medical Consultants:  None  Other Consultants:  None  IAnti-Infectives:   Vancomycin 10/14 --> 10/16 Zosyn 10/14 --> 10/16 Levaquin 10/16 -->  Faye Ramsay, MD  [facility] Pager [phone]  If 7PM-7AM, please contact night-coverage [email or web address] Password [email or web address] 07/26/2015, 1:30 PM  HPI/Subjective: No events overnight. More wheezing this AM.  Objective: Filed Vitals:   07/25/15 2328 07/26/15 0254 07/26/15 0442 07/26/15 0748  BP:   144/77   Pulse:   88   Temp: 99.1 F (37.3 C) 98.4 F (36.9 C) 99 F (37.2 C)   TempSrc: Oral Oral Oral   Resp:   22   Height:      Weight:   82.9 kg (182 lb 12.2 oz)  SpO2:   100% 100%    Intake/Output Summary (Last 24 hours) at 07/26/15 1330 Last data filed at 07/26/15 0815  Gross per 24 hour  Intake    180 ml  Output      0 ml  Net    180 ml    Exam:   General:  Pt is alert, not in acute distress  Cardiovascular: Regular rate and rhythm, no rubs, no gallops  Respiratory: Expiratory wheezing, diminished breath sounds at bases   Abdomen: Soft, non tender, non distended, bowel sounds present, no guarding  Extremities: pulses DP and PT palpable bilaterally  Data Reviewed: Basic Metabolic Panel:  Recent Labs Lab 07/23/15 1708 07/24/15 0425 07/25/15 0525 07/26/15 0432  NA 141 137 140 138  K 3.9 3.2* 3.3* 3.6  CL  104 104 104 103  CO2 26 24 28 26   GLUCOSE 87 79 96 87  BUN 17 16 19 20   CREATININE 1.04* 1.06* 1.27* 1.24*  CALCIUM 10.0 8.9 9.0 9.0  MG  --  1.6* 2.1 1.9  PHOS  --  3.7  --   --    Liver Function Tests:  Recent Labs Lab 07/23/15 1708 07/24/15 0425  AST 22 17  ALT 13* 8*  ALKPHOS 49 36*  BILITOT 0.8 1.0  PROT 8.3* 6.7  ALBUMIN 4.1 3.3*   CBC:  Recent Labs Lab 07/23/15 1708 07/24/15 0425 07/25/15 0525 07/26/15 0432  WBC 6.4 6.4 5.0 4.5  NEUTROABS 3.2  --   --   --   HGB 11.0* 9.6* 9.2* 8.5*  HCT 35.2* 31.3* 29.6* 27.8*  MCV 88.9 88.4 89.2 87.7  PLT 278 235 211 199   Recent Results (from the past 240 hour(s))  Urine culture     Status: None   Collection Time: 07/23/15  4:10 PM  Result Value Ref Range Status   Specimen Description URINE, CLEAN CATCH  Final   Special Requests NONE  Final   Culture   Final    NO GROWTH 2 DAYS Performed at [facility]    Report Status 07/25/2015 FINAL  Final  Blood Culture (routine x 2)     Status: None (Preliminary result)   Collection Time: 07/23/15  9:05 PM  Result Value Ref Range Status   Specimen Description BLOOD BLOOD LEFT HAND  Final   Special Requests BOTTLES DRAWN AEROBIC AND ANAEROBIC 5ML  Final   Culture   Final    NO GROWTH 2 DAYS Performed at [facility]    Report Status PENDING  Incomplete  Blood Culture (routine x 2)     Status: None (Preliminary result)   Collection Time: 07/23/15  9:08 PM  Result Value Ref Range Status   Specimen Description BLOOD LEFT ANTECUBITAL  Final   Special Requests BOTTLES DRAWN AEROBIC AND ANAEROBIC 5ML  Final   Culture   Final    NO GROWTH 2 DAYS Performed at [facility]    Report Status PENDING  Incomplete  MRSA PCR Screening     Status: None   Collection Time: 07/24/15  4:58 AM  Result Value Ref Range Status   MRSA by PCR NEGATIVE NEGATIVE Final    Comment:        The GeneXpert MRSA Assay (FDA approved for NASAL specimens only), is one component  of a comprehensive MRSA colonization surveillance program. It is not intended to diagnose MRSA infection nor to guide or monitor treatment for MRSA infections.      Scheduled Meds: .  aspirin EC  81 mg Oral Daily  . atorvastatin  20 mg Oral q1800  . carvedilol  6.25 mg Oral BID WC  . enoxaparin (LOVENOX) injection  40 mg Subcutaneous Q24H  . famotidine  10 mg Oral Daily  . ferrous sulfate  325 mg Oral BH-q7a  . furosemide  40 mg Oral Daily  . guaiFENesin  600 mg Oral BID  . isosorbide mononitrate  30 mg Oral Daily  . [START ON 07/27/2015] levofloxacin  750 mg Oral Q48H  . levothyroxine  50 mcg Oral QAC breakfast  . mometasone-formoterol  2 puff Inhalation BID  . sodium chloride  3 mL Intravenous Q12H   Continuous Infusions:

## 2015-07-26 NOTE — Care Management Important Message (Signed)
Important Message  Patient Details  Name: CHRISTIANNA BELMONTE MRN: [id number] Date of Birth: [birth year]   Medicare Important Message Given:  Yes-second notification given    Camillo Flaming 07/26/2015, 12:25 [place] Message  Patient Details  Name: ARMANII URBANIK MRN: [id number] Date of Birth: 1933-12-11   Medicare Important Message Given:  Yes-second notification given    Camillo Flaming 07/26/2015, 12:25 PM

## 2015-07-27 ENCOUNTER — Inpatient Hospital Stay (HOSPITAL_COMMUNITY): Payer: Medicare HMO

## 2015-07-27 DIAGNOSIS — A419 Sepsis, unspecified organism: Secondary | ICD-10-CM | POA: Diagnosis not present

## 2015-07-27 DIAGNOSIS — C20 Malignant neoplasm of rectum: Secondary | ICD-10-CM | POA: Diagnosis present

## 2015-07-27 DIAGNOSIS — C189 Malignant neoplasm of colon, unspecified: Secondary | ICD-10-CM

## 2015-07-27 DIAGNOSIS — N183 Chronic kidney disease, stage 3 unspecified: Secondary | ICD-10-CM | POA: Diagnosis present

## 2015-07-27 DIAGNOSIS — J449 Chronic obstructive pulmonary disease, unspecified: Secondary | ICD-10-CM

## 2015-07-27 DIAGNOSIS — E785 Hyperlipidemia, unspecified: Secondary | ICD-10-CM | POA: Diagnosis present

## 2015-07-27 DIAGNOSIS — D649 Anemia, unspecified: Secondary | ICD-10-CM | POA: Diagnosis present

## 2015-07-27 DIAGNOSIS — I5022 Chronic systolic (congestive) heart failure: Secondary | ICD-10-CM | POA: Diagnosis present

## 2015-07-27 DIAGNOSIS — D509 Iron deficiency anemia, unspecified: Secondary | ICD-10-CM | POA: Diagnosis present

## 2015-07-27 DIAGNOSIS — I10 Essential (primary) hypertension: Secondary | ICD-10-CM | POA: Diagnosis present

## 2015-07-27 DIAGNOSIS — E039 Hypothyroidism, unspecified: Secondary | ICD-10-CM | POA: Diagnosis present

## 2015-07-27 DIAGNOSIS — R509 Fever, unspecified: Secondary | ICD-10-CM

## 2015-07-27 HISTORY — DX: Malignant neoplasm of rectum: C20

## 2015-07-27 LAB — CBC
HEMATOCRIT: 29.9 % — AB (ref 36.0–46.0)
Hemoglobin: 9.1 g/dL — ABNORMAL LOW (ref 12.0–15.0)
MCH: 27.2 pg (ref 26.0–34.0)
MCHC: 30.4 g/dL (ref 30.0–36.0)
MCV: 89.3 fL (ref 78.0–100.0)
Platelets: 230 10*3/uL (ref 150–400)
RBC: 3.35 MIL/uL — ABNORMAL LOW (ref 3.87–5.11)
RDW: 15.8 % — AB (ref 11.5–15.5)
WBC: 5.1 10*3/uL (ref 4.0–10.5)

## 2015-07-27 LAB — LACTIC ACID, PLASMA
LACTIC ACID, VENOUS: 1 mmol/L (ref 0.5–2.0)
LACTIC ACID, VENOUS: 1.2 mmol/L (ref 0.5–2.0)

## 2015-07-27 LAB — BASIC METABOLIC PANEL
ANION GAP: 13 (ref 5–15)
BUN: 20 mg/dL (ref 6–20)
CALCIUM: 9.6 mg/dL (ref 8.9–10.3)
CO2: 26 mmol/L (ref 22–32)
CREATININE: 1.13 mg/dL — AB (ref 0.44–1.00)
Chloride: 101 mmol/L (ref 101–111)
GFR calc Af Amer: 52 mL/min — ABNORMAL LOW (ref >60)
GFR calc non Af Amer: 45 mL/min — ABNORMAL LOW (ref >60)
Glucose, Bld: 79 mg/dL (ref 65–99)
Potassium: 3.6 mmol/L (ref 3.5–5.1)
Sodium: 140 mmol/L (ref 135–145)

## 2015-07-27 LAB — PROCALCITONIN: PROCALCITONIN: 0.14 ng/mL

## 2015-07-27 MED ORDER — PNEUMOCOCCAL VAC POLYVALENT 25 MCG/0.5ML IJ INJ
0.5000 mL | INJECTION | INTRAMUSCULAR | Status: AC
  Administered 2015-07-28: 0.5 mL via INTRAMUSCULAR
  Filled 2015-07-27 (×2): qty 0.5

## 2015-07-27 NOTE — Clinical Social Work Note (Signed)
Clinical Social Work Assessment  Patient Details  Name: [name] MRN: [id number] Date of Birth: [birth year]  Date of referral:  07/20/15               Reason for consult:  Facility Placement                Permission sought to share information with:  Facility Art therapist granted to share information::  Yes, Verbal Permission Granted  Name::      (ALF rep and her son- [name])  Agency::     Relationship::     Contact Information:     Housing/Transportation Living arrangements for the past 2 months:  [place] of Information:  Patient Patient Interpreter Needed:  None Criminal Activity/Legal Involvement Pertinent to Current Situation/Hospitalization:  No - Comment as needed Significant Relationships:  Adult Children Lives with:  Facility Resident Do you feel safe going back to the place where you live?  Yes Need for family participation in patient care:  Yes (Comment)  Care giving concerns:  PT recommending SNF- form ALF.    Social Worker assessment / plan:  CSW met with patient who reports she has lived at [place] for about 4 months. Discussed dc plans with her and she hopes/plans to return there at dc. PT is recommending SNF at dc and CSW has discussed this with patient, ALF rep and Patient's son.  All feel that she will be able to be cared for back at ALF with HHPT and OT.  Will discuss further with RNCM and therapist. FL2 updated.  Employment status:  Retired Forensic scientist:  Medicaid In [place], [employer] PT Recommendations:  Home with [place], [place] / Referral to community resources:     Patient/Family's Response to care:  Patient   Patient/Family's Understanding of and Emotional Response to Diagnosis, Current Treatment, and Prognosis:  Patient and family are pleased with the progress she is making and voiced no concerns related to her current illness/hospital  stay.  Emotional Assessment Appearance:  Appears younger than stated age Attitude/Demeanor/Rapport:   (pleasant ) Affect (typically observed):  Accepting, Appropriate Orientation:  Oriented to Self, Oriented to Place, Oriented to  Time, Oriented to Situation Alcohol / Substance use:  Not Applicable Psych involvement (Current and /or in the community):  No (Comment)  Discharge Needs  Concerns to be addressed:  Discharge Planning Concerns Readmission within the last 30 days:  No Current discharge risk:  None Barriers to Discharge:      Ludwig Clarks, LCSW 07/27/2015, 12:48 PM

## 2015-07-27 NOTE — Progress Notes (Signed)
Physical Therapy Treatment Patient Details Name: [name] MRN: [id number] DOB: Feb 07, 1934 Today's Date: 07/27/2015    History of Present Illness 81 y.o. female with COPD, DM, HLD, systolic CHF with last EF 70 - 40% in June 2016, colon cancer and status post resection and now with colostomy, presented from SNF for evaluation of confusion and urinary incontinence, unable to ambulate with walker    PT Comments    Assisted pt OOB to [facility] with increased time.  Very slow moving.  Overall feeling "sick".  Assisted with amb a limited distance due to MAX c/o weakness/fatigue (Sepsis).  Positioned in recliner to comfort.    Pt progressing slowly.  Will need ST Rehab at SNF prior to safely returning to ALF.  Follow Up Recommendations  SNF      Equipment Recommendations       Recommendations for Other Services       Precautions / Restrictions Precautions Precautions: Fall Precaution Comments: colostomy Restrictions Weight Bearing Restrictions: No    Mobility  Bed Mobility Overal bed mobility: Needs Assistance Bed Mobility: Supine to Sit     Supine to sit: Mod assist;Min assist     General bed mobility comments: increased time esp for upper body.  Very slow moving but self able.   Transfers Overall transfer level: Needs assistance Equipment used: None Transfers: Sit to/from [facility] Sit to Stand: Min assist;Mod assist Stand pivot transfers: Min assist;Mod assist       General transfer comment: increased time and extra assist to control stand to sit.  25% VC's on proper hand placement with stand to sit.   Ambulation/Gait Ambulation/Gait assistance: Min guard;Min assist Ambulation Distance (Feet): 11 Feet Assistive device: Rolling walker (2 wheeled) Gait Pattern/deviations: Step-to pattern;Step-through pattern;Decreased step length - right;Decreased step length - left;Decreased stride length;Shuffle;Trunk flexed Gait velocity: decreased   General  Gait Details: very slow sluggish gait with limited amb distance due to MAX c/o weakness/fatigue.  Overall feel "sick".     Stairs            Wheelchair Mobility    Modified Rankin (Stroke Patients Only)       Balance                                    Cognition Arousal/Alertness: Awake/alert Behavior During Therapy: WFL for tasks assessed/performed Overall Cognitive Status: Within Functional Limits for tasks assessed                 General Comments: slow to respond but AxO x 4 and aware of her current situation    Exercises      General Comments        Pertinent Vitals/Pain Pain Assessment: No/denies pain    Home Living                      Prior Function            PT Goals (current goals can now be found in the care plan section) Progress towards PT goals: Progressing toward goals    Frequency  Min 3X/week    PT Plan      Co-evaluation             End of Session Equipment Utilized During Treatment: Gait belt Activity Tolerance: Patient limited by fatigue;Treatment limited secondary to medical complications (Comment) (sepsis) Patient left: in chair;with call bell/phone within reach;with  chair alarm set     Time: 1142-1210 PT Time Calculation (min) (ACUTE ONLY): 28 min  Charges:  $Gait Training: 8-22 mins $Therapeutic Activity: 8-22 mins                    G Codes:      Rica Koyanagi  PTA [facility]  Acute  Rehab Pager      [phone]

## 2015-07-27 NOTE — Progress Notes (Signed)
Patient ID: [name], female   DOB: [birth year], 81 y.o.   MRN: [id number] [facility] PROGRESS NOTE  VERDELLE VALTIERRA [id number]:[id number] DOB: Jan 11, 1934 DOA: 07/23/2015 PCP: Dr. Early Osmond   Brief narrative:    81 y.o. female with past medical history of COPD, hypertension, dyslipidemia, chronic systolic CHF with last EF 35 - 40% in June 2016, colon cancer and status post resection with colostomy who presented from SNF for evaluation of confusion and urinary incontinence.  Patient was febrile in ED, tachycardic but no obvious source of infection identified. So far her blood and urine cultures all negative, influenza is negative and no acute findings on CXR or CT chest. She was initially on vanco and zosyn but this was narrowed down to Levaquin 10/16.  She continues to spike low grade fevers for which reason we will obtain CT abd for further evaluation.   Anticipated discharge: discharge once fevers resolve, per PT - recommendation for SNF placement.   Assessment/Plan:     Principal Problem: Sepsis ([place]) /  FUO (fever of unknown origin) - SIRS criteria on admission present but today definitely met sepsis criteria with fever, tachycardia, tachypnea. No obvious source of infection. Sepsis work up initiated. Check lactic acid, procalcitonin and repeat blood cultures - We will also obtain CT abd for fever of unknown origin work up - CXR and CT chest so far with no acute findings. Blood cultures and urine culture on admission negative so far. - She was on vanco and zosyn thorough 10/16 and then switched to Levaquin. Continue Levaquin for now unless fever goes up then switch back to vanco and zosyn.  - WBC count is WNL   Active Problems: Acute encephalopathy, metabolic / Dementia - Likely due to sepsis - No significant changes in mental status - Continue to treat with Levaquin and monitor for improvement in mental status    COPD (chronic obstructive pulmonary disease) ([place]) /  Questionable chronic pulmonary embolism on CT chest  - Stable COPD - Not in acute exacerbation - Ventilation/perfusion scan with low probability for PE - Continue dulera inhaler BID   CKD (chronic kidney disease) stage 3, GFR 30-59 ml/min - Baseline creatinine 1.27 back in 03/2015 - Creatinine on this admission within baseline range   Hypokalemia - Due to lasix - Potassium WNL  Anemia of chronic disease / IDA (iron deficiency anemia) - Due to anemia of CKD as well as iron deficiency - Hemoglobin stable  - Continue ferrous sulfate supplementation   Benign essential HTN - Continue coreg 6.25 mg BID,  lasix 40 mg daily, imdur 30 mg daily   Hypothyroidism, adult - Continue synthroid  Dyslipidemia - Continue statin therapy   Chronic systolic CHF (congestive heart failure) ([place]) / ICD in place - Compensated - 2 D ECHO in 03/2015 with EF of 35% - Continue aspirin daily - Continue coreg, statin - Continue imdur, lasix  Colon cancer (HCC) / Colostomy in place ([place]) - Stable   DVT Prophylaxis  - Lovenox subQ ordered   Code Status: Full.  Family Communication:  plan of care discussed with the patient Disposition Plan: to SNF once fevers resolve.    IV access:  Peripheral IV  Procedures and diagnostic studies:    Dg Chest 2 View 07/23/2015 Central mild vascular congestion without pulmonary edema. Cardiomegaly again noted. Mild left basilar atelectasis. Dual lead cardiac pacemaker in place. Electronically Signed   By: Lahoma Crocker M.D.   On: 07/23/2015 18:48   Ct Chest Wo  Contrast 07/26/2015  1. Mosaic attenuation pattern is identified in both lungs which may be a manifestation of small airways disease but could also be seen with chronic pulmonary embolus. 2. 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 3. Aortic atherosclerosis 4. Small pericardial effusion 5. Gallstones. Electronically Signed   By: Kerby Moors M.D.   On: 07/26/2015 11:04   Nm  Pulmonary Perf And Vent 07/26/2015 Very low probability for pulmonary embolism.    Medical Consultants:  None   Other Consultants:  Physical therapy  IAnti-Infectives:   Vancomycin 10/14 --> 10/16 Zosyn 10/14 --> 10/16 Levaquin 10/16 -->    Naavya Postma, Dedra Skeens, MD  [facility] Pager [phone]  Time spent in minutes: 25 minutes  If 7PM-7AM, please contact night-coverage [email or web address] Password [email or web address] 07/27/2015, 11:07 AM   LOS: 3 days    HPI/Subjective: No acute overnight events. No respiratory distress.    Objective: Filed Vitals:   07/27/15 0241 07/27/15 0450 07/27/15 0753 07/27/15 0945  BP:  145/53  130/55  Pulse:  85  81  Temp: 98.7 F (37.1 C) 100.3 F (37.9 C)    TempSrc: Oral Oral    Resp:  22    Height:      Weight:  80.6 kg (177 lb 11.1 oz)    SpO2:  98% 96%     Intake/Output Summary (Last 24 hours) at 07/27/15 1107 Last data filed at 07/27/15 0801  Gross per 24 hour  Intake    180 ml  Output      0 ml  Net    180 ml    Exam:   General:  Pt is alert, follows commands appropriately, not in acute distress  Cardiovascular: Regular rate and rhythm, S1/S2 appreciated   Respiratory: Clear to auscultation bilaterally, no wheezing, no crackles, no rhonchi  Abdomen: (+) BS, non tender, (+) colostomy   Extremities: No edema, pulses DP and PT palpable bilaterally  Neuro: Grossly nonfocal  Data Reviewed: Basic Metabolic Panel:  Recent Labs Lab 07/23/15 1708 07/24/15 0425 07/25/15 0525 07/26/15 0432 07/27/15 0434  NA 141 137 140 138 140  K 3.9 3.2* 3.3* 3.6 3.6  CL 104 104 104 103 101  CO2 _0 GLUCOSE 87 79 96 87 79  BUN _1 CREATININE 1.04* 1.06* 1.27* 1.24* 1.13*  CALCIUM 10.0 8.9 9.0 9.0 9.6  MG  --  1.6* 2.1 1.9  --   PHOS  --  3.7  --   --   --    Liver Function Tests:  Recent Labs Lab 07/23/15 1708 07/24/15 0425  AST 22 17  ALT 13* 8*  ALKPHOS 49 36*  BILITOT 0.8 1.0  PROT 8.3* 6.7  ALBUMIN 4.1  3.3*   No results for input(s): LIPASE, AMYLASE in the last 168 hours. No results for input(s): AMMONIA in the last 168 hours. CBC:  Recent Labs Lab 07/23/15 1708 07/24/15 0425 07/25/15 0525 07/26/15 0432 07/27/15 0434  WBC 6.4 6.4 5.0 4.5 5.1  NEUTROABS 3.2  --   --   --   --   HGB 11.0* 9.6* 9.2* 8.5* 9.1*  HCT 35.2* 31.3* 29.6* 27.8* 29.9*  MCV 88.9 88.4 89.2 87.7 89.3  PLT 278 235 211 199 230   Cardiac Enzymes: No results for input(s): CKTOTAL, CKMB, CKMBINDEX, TROPONINI in the last 168 hours. BNP: Invalid input(s): POCBNP CBG: No results for input(s): GLUCAP in the last 168 hours.  Recent Results (from the  past 240 hour(s))  Urine culture     Status: None   Collection Time: 07/23/15  4:10 PM  Result Value Ref Range Status   Specimen Description URINE, CLEAN CATCH  Final   Special Requests NONE  Final   Culture   Final    NO GROWTH 2 DAYS Performed at [facility]    Report Status 07/25/2015 FINAL  Final  Blood Culture (routine x 2)     Status: None (Preliminary result)   Collection Time: 07/23/15  9:05 PM  Result Value Ref Range Status   Specimen Description BLOOD BLOOD LEFT HAND  Final   Special Requests BOTTLES DRAWN AEROBIC AND ANAEROBIC 5ML  Final   Culture   Final    NO GROWTH 3 DAYS Performed at [facility]    Report Status PENDING  Incomplete  Blood Culture (routine x 2)     Status: None (Preliminary result)   Collection Time: 07/23/15  9:08 PM  Result Value Ref Range Status   Specimen Description BLOOD LEFT ANTECUBITAL  Final   Special Requests BOTTLES DRAWN AEROBIC AND ANAEROBIC 5ML  Final   Culture   Final    NO GROWTH 3 DAYS Performed at [facility]    Report Status PENDING  Incomplete  MRSA PCR Screening     Status: None   Collection Time: 07/24/15  4:58 AM  Result Value Ref Range Status   MRSA by PCR NEGATIVE NEGATIVE Final     Scheduled Meds: . aspirin EC  81 mg Oral Daily  . atorvastatin  20 mg Oral q1800  .  carvedilol  6.25 mg Oral BID WC  . enoxaparin (LOVENOX) injection  40 mg Subcutaneous Q24H  . famotidine  10 mg Oral Daily  . ferrous sulfate  325 mg Oral BH-q7a  . furosemide  40 mg Oral Daily  . guaiFENesin  600 mg Oral BID  . isosorbide mononitrate  30 mg Oral Daily  . levofloxacin  750 mg Oral Q48H  . levothyroxine  50 mcg Oral QAC breakfast  . mometasone-formoterol  2 puff Inhalation BID

## 2015-07-28 DIAGNOSIS — F0390 Unspecified dementia without behavioral disturbance: Secondary | ICD-10-CM

## 2015-07-28 DIAGNOSIS — Z95810 Presence of automatic (implantable) cardiac defibrillator: Secondary | ICD-10-CM

## 2015-07-28 DIAGNOSIS — Z933 Colostomy status: Secondary | ICD-10-CM

## 2015-07-28 DIAGNOSIS — A419 Sepsis, unspecified organism: Principal | ICD-10-CM

## 2015-07-28 DIAGNOSIS — G9340 Encephalopathy, unspecified: Secondary | ICD-10-CM

## 2015-07-28 DIAGNOSIS — I5022 Chronic systolic (congestive) heart failure: Secondary | ICD-10-CM

## 2015-07-28 DIAGNOSIS — N189 Chronic kidney disease, unspecified: Secondary | ICD-10-CM

## 2015-07-28 LAB — CULTURE, BLOOD (ROUTINE X 2)
Culture: NO GROWTH
Culture: NO GROWTH

## 2015-07-28 NOTE — Progress Notes (Signed)
[facility] will follow pt with HHPT/OT at ALF. Referral was called to in house rep.

## 2015-07-28 NOTE — Progress Notes (Signed)
Patient ID: [name], female   DOB: 05-Feb-1934, 81 y.o.   MRN: [id number] [facility] PROGRESS NOTE  WESTYN KEATLEY [id number] DOB: [birth year] DOA: 07/23/2015 PCP: Dr. Early Osmond   Brief narrative:    81 y.o. female with past medical history of COPD, hypertension, dyslipidemia, chronic systolic CHF with last EF 35 - 40% in June 2016, colon cancer and status post resection with colostomy who presented from SNF for evaluation of confusion and urinary incontinence.  Patient was febrile in ED, tachycardic but no obvious source of infection identified. So far her blood and urine cultures all negative, influenza is negative and no acute findings on CXR or CT chest. She was initially on vanco and zosyn but this was narrowed down to Levaquin 10/16.  She continues to spike low grade fevers for which reason we will obtain CT abd for further evaluation.   Anticipated discharge: discharge once fevers resolve, per PT - recommendation for SNF placement.   Assessment/Plan:     Principal Problem: Sepsis ([place]) /  FUO (fever of unknown origin) - sepsis criteria with fever, tachycardia, tachypnea. No obvious source of infection.  - Blood cultures and urine culture on admission negative so far.Ct chest ?small airway disease, CT ab no acute findings - She was on vanco and zosyn thorough 10/16 and then switched to Levaquin.   - WBC count is WNL, fever seems subsided, discharge to snf if fever free   Active Problems: Acute encephalopathy, metabolic / Dementia - Likely due to sepsis -  Continue to treat with Levaquin and monitor for improvement in mental status, does has baseline dementia, not oriented to time and place.   COPD (chronic obstructive pulmonary disease) ([facility]) / Questionable chronic pulmonary embolism on CT chest  - Stable COPD - Not in acute exacerbation - Ventilation/perfusion scan with low probability for PE - Continue dulera inhaler BID   CKD (chronic kidney disease)  stage 3, GFR 30-59 ml/min - Baseline creatinine 1.27 back in 03/2015 - Creatinine on this admission within baseline range   Hypokalemia - Due to lasix - Potassium WNL  Anemia of chronic disease / IDA (iron deficiency anemia) - Due to anemia of CKD as well as iron deficiency - Hemoglobin stable  - Continue ferrous sulfate supplementation   Benign essential HTN - Continue coreg 6.25 mg BID,  lasix 40 mg daily, imdur 30 mg daily   Hypothyroidism, adult - Continue synthroid  Dyslipidemia - Continue statin therapy   Chronic systolic CHF (congestive heart failure) ([place]) / ICD in place - Compensated - 2 D ECHO in 03/2015 with EF of 35% - Continue aspirin daily - Continue coreg, statin - Continue imdur, lasix  Colon cancer ([place]) / Colostomy in place ([place]) - Stable   DVT Prophylaxis  - Lovenox subQ ordered   Code Status: Full.  Family Communication:  plan of care discussed with the patient Disposition Plan: to SNF once fevers resolve.    IV access:  Peripheral IV  Procedures and diagnostic studies:    Dg Chest 2 View 07/23/2015 Central mild vascular congestion without pulmonary edema. Cardiomegaly again noted. Mild left basilar atelectasis. Dual lead cardiac pacemaker in place. Electronically Signed   By: Lahoma Crocker M.D.   On: 07/23/2015 18:48   Ct Chest Wo Contrast 07/26/2015  1. Mosaic attenuation pattern is identified in both lungs which may be a manifestation of small airways disease but could also be seen with chronic pulmonary embolus. 2. Decreased AP diameter of the trachea  compatible with tracheomalacia. 3. Aortic atherosclerosis 4. Small pericardial effusion 5. Gallstones. Electronically Signed   By: Kerby Moors M.D.   On: 07/26/2015 11:04   Nm Pulmonary Perf And Vent 07/26/2015 Very low probability for pulmonary embolism.    Medical Consultants:  None   Other Consultants:  Physical therapy  IAnti-Infectives:   Vancomycin 10/14 --> 10/16 Zosyn 10/14 -->  10/16 Levaquin 10/16 -->    Javares Kaufhold, MDPhD [facility] Pager [phone]  Time spent in minutes: 25 minutes  If 7PM-7AM, please contact night-coverage [email or web address] Password [email or web address] 07/28/2015, 6:23 PM   LOS: 4 days    HPI/Subjective: No acute overnight events. No respiratory distress.  Baseline demented  Objective: Filed Vitals:   07/28/15 0534 07/28/15 0819 07/28/15 0847 07/28/15 1427  BP: 153/74 146/61  110/57  Pulse: 96   79  Temp: 99.4 F (37.4 C) 98.7 F (37.1 C)  98.3 F (36.8 C)  TempSrc: Oral Oral  Oral  Resp: 20 18  18   Height:      Weight:      SpO2: 99%  96% 100%    Intake/Output Summary (Last 24 hours) at 07/28/15 1823 Last data filed at 07/28/15 1534  Gross per 24 hour  Intake    240 ml  Output    400 ml  Net   -160 ml    Exam:   General:  Pt is alert, follows commands appropriately, not in acute distress  Cardiovascular: Regular rate and rhythm, S1/S2 appreciated   Respiratory: Clear to auscultation bilaterally, no wheezing, no crackles, no rhonchi  Abdomen: (+) BS, non tender, (+) colostomy   Extremities: No edema, pulses DP and PT palpable bilaterally  Neuro: Grossly nonfocal, baseline dementia  Data Reviewed: Basic Metabolic Panel:  Recent Labs Lab 07/23/15 1708 07/24/15 0425 07/25/15 0525 07/26/15 0432 07/27/15 0434  NA 141 137 140 138 140  K 3.9 3.2* 3.3* 3.6 3.6  CL 104 104 104 103 101  CO2 26 24 28 26 26   GLUCOSE 87 79 96 87 79  BUN 17 16 19 20 20   CREATININE 1.04* 1.06* 1.27* 1.24* 1.13*  CALCIUM 10.0 8.9 9.0 9.0 9.6  MG  --  1.6* 2.1 1.9  --   PHOS  --  3.7  --   --   --    Liver Function Tests:  Recent Labs Lab 07/23/15 1708 07/24/15 0425  AST 22 17  ALT 13* 8*  ALKPHOS 49 36*  BILITOT 0.8 1.0  PROT 8.3* 6.7  ALBUMIN 4.1 3.3*   No results for input(s): LIPASE, AMYLASE in the last 168 hours. No results for input(s): AMMONIA in the last 168 hours. CBC:  Recent Labs Lab 07/23/15 1708  07/24/15 0425 07/25/15 0525 07/26/15 0432 07/27/15 0434  WBC 6.4 6.4 5.0 4.5 5.1  NEUTROABS 3.2  --   --   --   --   HGB 11.0* 9.6* 9.2* 8.5* 9.1*  HCT 35.2* 31.3* 29.6* 27.8* 29.9*  MCV 88.9 88.4 89.2 87.7 89.3  PLT 278 235 211 199 230   Cardiac Enzymes: No results for input(s): CKTOTAL, CKMB, CKMBINDEX, TROPONINI in the last 168 hours. BNP: Invalid input(s): POCBNP CBG: No results for input(s): GLUCAP in the last 168 hours.  Recent Results (from the past 240 hour(s))  Urine culture     Status: None   Collection Time: 07/23/15  4:10 PM  Result Value Ref Range Status   Specimen Description URINE, CLEAN CATCH  Final   Special Requests NONE  Final   Culture   Final    NO GROWTH 2 DAYS Performed at [facility]    Report Status 07/25/2015 FINAL  Final  Blood Culture (routine x 2)     Status: None (Preliminary result)   Collection Time: 07/23/15  9:05 PM  Result Value Ref Range Status   Specimen Description BLOOD BLOOD LEFT HAND  Final   Special Requests BOTTLES DRAWN AEROBIC AND ANAEROBIC 5ML  Final   Culture   Final    NO GROWTH 3 DAYS Performed at [facility]    Report Status PENDING  Incomplete  Blood Culture (routine x 2)     Status: None (Preliminary result)   Collection Time: 07/23/15  9:08 PM  Result Value Ref Range Status   Specimen Description BLOOD LEFT ANTECUBITAL  Final   Special Requests BOTTLES DRAWN AEROBIC AND ANAEROBIC 5ML  Final   Culture   Final    NO GROWTH 3 DAYS Performed at [facility]    Report Status PENDING  Incomplete  MRSA PCR Screening     Status: None   Collection Time: 07/24/15  4:58 AM  Result Value Ref Range Status   MRSA by PCR NEGATIVE NEGATIVE Final     Scheduled Meds: . aspirin EC  81 mg Oral Daily  . atorvastatin  20 mg Oral q1800  . carvedilol  6.25 mg Oral BID WC  . enoxaparin (LOVENOX) injection  40 mg Subcutaneous Q24H  . famotidine  10 mg Oral Daily  . ferrous sulfate  325 mg Oral BH-q7a  .  furosemide  40 mg Oral Daily  . guaiFENesin  600 mg Oral BID  . isosorbide mononitrate  30 mg Oral Daily  . levofloxacin  750 mg Oral Q48H  . levothyroxine  50 mcg Oral QAC breakfast  . mometasone-formoterol  2 puff Inhalation BID

## 2015-07-29 DIAGNOSIS — D638 Anemia in other chronic diseases classified elsewhere: Secondary | ICD-10-CM

## 2015-07-29 LAB — CBC WITH DIFFERENTIAL/PLATELET
Basophils Absolute: 0 10*3/uL (ref 0.0–0.1)
Basophils Relative: 0 %
Eosinophils Absolute: 0 10*3/uL (ref 0.0–0.7)
Eosinophils Relative: 1 %
HEMATOCRIT: 30.3 % — AB (ref 36.0–46.0)
HEMOGLOBIN: 9.2 g/dL — AB (ref 12.0–15.0)
LYMPHS ABS: 1.5 10*3/uL (ref 0.7–4.0)
Lymphocytes Relative: 32 %
MCH: 27.3 pg (ref 26.0–34.0)
MCHC: 30.4 g/dL (ref 30.0–36.0)
MCV: 89.9 fL (ref 78.0–100.0)
MONOS PCT: 13 %
Monocytes Absolute: 0.6 10*3/uL (ref 0.1–1.0)
NEUTROS ABS: 2.5 10*3/uL (ref 1.7–7.7)
NEUTROS PCT: 54 %
Platelets: 276 10*3/uL (ref 150–400)
RBC: 3.37 MIL/uL — AB (ref 3.87–5.11)
RDW: 15.8 % — ABNORMAL HIGH (ref 11.5–15.5)
WBC: 4.6 10*3/uL (ref 4.0–10.5)

## 2015-07-29 LAB — COMPREHENSIVE METABOLIC PANEL
ALBUMIN: 3.1 g/dL — AB (ref 3.5–5.0)
ALK PHOS: 45 U/L (ref 38–126)
ALT: 13 U/L — AB (ref 14–54)
ANION GAP: 10 (ref 5–15)
AST: 20 U/L (ref 15–41)
BILIRUBIN TOTAL: 0.5 mg/dL (ref 0.3–1.2)
BUN: 21 mg/dL — ABNORMAL HIGH (ref 6–20)
CALCIUM: 9.5 mg/dL (ref 8.9–10.3)
CO2: 27 mmol/L (ref 22–32)
CREATININE: 1.11 mg/dL — AB (ref 0.44–1.00)
Chloride: 104 mmol/L (ref 101–111)
GFR calc non Af Amer: 46 mL/min — ABNORMAL LOW (ref >60)
GFR, EST AFRICAN AMERICAN: 53 mL/min — AB (ref >60)
GLUCOSE: 87 mg/dL (ref 65–99)
Potassium: 3.7 mmol/L (ref 3.5–5.1)
Sodium: 141 mmol/L (ref 135–145)
TOTAL PROTEIN: 7.2 g/dL (ref 6.5–8.1)

## 2015-07-29 LAB — LACTIC ACID, PLASMA: Lactic Acid, Venous: 0.8 mmol/L (ref 0.5–2.0)

## 2015-07-29 LAB — PROCALCITONIN

## 2015-07-29 LAB — MAGNESIUM: Magnesium: 1.8 mg/dL (ref 1.7–2.4)

## 2015-07-29 MED ORDER — FLORANEX PO TABS
1.0000 | ORAL_TABLET | Freq: Three times a day (TID) | ORAL | Status: DC
Start: 2015-07-29 — End: 2017-02-17

## 2015-07-29 MED ORDER — AMOXICILLIN-POT CLAVULANATE 875-125 MG PO TABS
1.0000 | ORAL_TABLET | Freq: Two times a day (BID) | ORAL | Status: DC
Start: 2015-07-29 — End: 2015-08-11

## 2015-07-29 NOTE — Progress Notes (Signed)
Patient for d/c today to ALF  bed at  [facility] as prior to admission. CSW has confirmed plans with patient, her son and with ALF rep.  Will plan transfer via EMS. Eduard Clos, MSW, [place]

## 2015-07-29 NOTE — Progress Notes (Signed)
D/C'd to ALF via stretcher via [facility] voices no c/o

## 2015-07-29 NOTE — Discharge Summary (Signed)
Discharge Summary  [name] PYK:[id number] DOB: 1933/11/26  PCP: No primary care provider on file.  Admit date: 07/23/2015 Discharge date: 07/29/2015  Time spent: >51mins  Recommendations for Outpatient Follow-up:  1. F/u with PMD within a week for hospital discharge follow up 2. F/u with cardiology for h/o nonischemic cardiomyopathy and s/p pacemaker  Discharge Diagnoses:  Active Hospital Problems   Diagnosis Date Noted  . Sepsis ([place]) 07/24/2015  . Dyslipidemia 07/27/2015  . CKD (chronic kidney disease) stage 3, GFR 30-59 ml/min 07/27/2015  . Anemia of chronic disease 07/27/2015  . Benign essential HTN 07/27/2015  . Hypothyroidism, adult 07/27/2015  . FUO (fever of unknown origin) 07/27/2015  . IDA (iron deficiency anemia) 07/27/2015  . Chronic systolic CHF (congestive heart failure) ([place]) 07/27/2015  . Colon cancer ([place]) 07/27/2015  . Acute encephalopathy 07/24/2015  . Hypokalemia 07/24/2015  . Dementia 07/23/2015  . COPD (chronic obstructive pulmonary disease) ([place]) 04/25/2015  . ICD in place 04/14/2015  . Colostomy in place [facility]) 03/01/2015    Resolved Hospital Problems   Diagnosis Date Noted Date Resolved  . Acute on chronic systolic heart failure ([place]) 07/24/2015 07/27/2015  . History of iron deficiency anemia 07/24/2015 07/27/2015  . SIRS (systemic inflammatory response syndrome) ([place]) 07/23/2015 07/27/2015  . HTN (hypertension) 03/01/2015 07/27/2015  . Chronic renal insufficiency, stage II (mild) 03/01/2015 07/27/2015    Discharge Condition: stable  Diet recommendation: heart healthy/carb modified, further modification per swallow eval result  Filed Weights   07/27/15 0450 07/28/15 0500 07/29/15 0438  Weight: 177 lb 11.1 oz (80.6 kg) 181 lb (82.1 kg) 179 lb 3.7 oz (81.3 kg)    History of present illness:  [name] is a 81 y.o. female   has a past medical history of Hypertension; Asthma; CHF (congestive heart failure) ([place]); COPD (chronic  obstructive pulmonary disease) ([place]); Colon cancer ([place]); Hyperlipidemia; Heart murmur; Anginal pain ([place]); Pulmonary embolism ([place]); Pneumonia; Hypothyroidism; Diabetes mellitus without complication ([place]); History of blood transfusion; Arthritis; Renal disorder; and Colostomy care [facility]).   Presented from nursing home [facility]) with complaints of worsening fatigue and decreased activity. Her records at her baseline patient able to care for herself and ambulates with a walker. Per nursing home staff today she has been incontinent she had had some low extremity edema. Patient did not endorse any nausea no vomiting no diarrhea no chest pain no headache she denies being short of breath. Patient was not able to provide much of a history secondary to dementia. Given tachycardia patient had her pacemaker interrogated which was unremarkable. In emergency department she was noted to be febrile up to 102.6 with heart rate up to 102 normal white blood cell count. UA did not show any evidence of infection. Chest x-ray showed mid vascular congestion was a pulmonary edema mild left basilar atelectasis but otherwise no infiltrate. Patient was started on broad-spectrum antibiotics for presumed SIRS. Lactic acid was1.6 Patient has history of colon cancer status post surgical resection colostomy followed by oncology She has history of nonischemic cardiomyopathy with systolic heart failure with EF of 35-40 percent.  Hospital Course:  Principal Problem:   Sepsis ([place]) Active Problems:   Colostomy in place [facility])   ICD in place   COPD (chronic obstructive pulmonary disease) ([place])   Dementia   Acute encephalopathy   Hypokalemia   Dyslipidemia   CKD (chronic kidney disease) stage 3, GFR 30-59 ml/min   Anemia of chronic disease   Benign essential HTN   Hypothyroidism, adult  FUO (fever of unknown origin)   IDA (iron deficiency anemia)   Chronic systolic CHF (congestive heart failure) (HCC)   Colon cancer  (HCC)  Sepsis (HCC) / FUO (fever of unknown origin) - sepsis criteria with fever 102 on admission, tachycardia, tachypnea. No obvious source of infection.  - Blood cultures and urine culture on admission negative so far.Ct chest ?small airway disease, CT ab no acute findings - She was on vanco and zosyn thorough 10/16 and then switched to Levaquin.  - WBC count is WNL, fever seems subsided, baseline QTc mild prolonged, discharged on augmentin to finish total of 10days abx course. Swallow eval pending to r/o aspiration. -fever free for 24hrs prior to discharge.   Active Problems: Acute encephalopathy, metabolic / Dementia - Likely due to sepsis - was treated with vanc and zosyn initially , changed to Levaquin and monitor for improvement in mental status, does has baseline dementia, at time of discharge know the year/place and person, but not very interactive  COPD (chronic obstructive pulmonary disease) ([place]) / Questionable chronic pulmonary embolism on CT chest  - Stable COPD - Not in acute exacerbation - Ventilation/perfusion scan with low probability for PE - Continue dulera inhaler BID  CKD (chronic kidney disease) stage 3, GFR 30-59 ml/min - Baseline creatinine 1.27 back in 03/2015 - Creatinine on this admission within baseline range   Hypokalemia - Due to lasix - Potassium WNL  Anemia of chronic disease / IDA (iron deficiency anemia) - Due to anemia of CKD as well as iron deficiency - Hemoglobin stable  - Continue ferrous sulfate supplementation   Benign essential HTN - Continue coreg 6.25 mg BID, lasix 40 mg daily, imdur 30 mg daily   Hypothyroidism, adult - Continue synthroid  Dyslipidemia - Continue statin therapy   Chronic systolic CHF (congestive heart failure) ([place]) /nonischemic cardiomyopathy/ ICD in place - Compensated - 2 D ECHO in 03/2015 with EF of 35% - Continue aspirin daily - Continue coreg, statin - Continue imdur, lasix -outpatient  cardiology follow up  Colon cancer ([place]) / Colostomy in place ([place]) - Stable    Code Status: Full.  Family Communication: plan of care discussed with the patient Disposition Plan: SNF    Discharge Exam: BP 138/55 mmHg  Pulse 76  Temp(Src) 98.3 F (36.8 C) (Oral)  Resp 18  Ht 5\' 8"  (1.727 m)  Wt 179 lb 3.7 oz (81.3 kg)  BMI 27.26 kg/m2  SpO2 90%  General: alert to year,place and person, weak and frail,  Cardiovascular: paced rhythm Respiratory: diminished, poor respiratory effort, no wheezing, no rales, no rhonchi Ab: chronic colostomy bag with soft brown fecal matters Extremity: trace edema Neuro:  alert to year,place and person, not very interactive  Discharge Instructions You were cared for by a hospitalist during your hospital stay. If you have any questions about your discharge medications or the care you received while you were in the hospital after you are discharged, you can call the unit and asked to speak with the hospitalist on call if the hospitalist that took care of you is not available. Once you are discharged, your primary care physician will handle any further medical issues. Please note that NO REFILLS for any discharge medications will be authorized once you are discharged, as it is imperative that you return to your primary care physician (or establish a relationship with a primary care physician if you do not have one) for your aftercare needs so that they can reassess your need for medications  and monitor your lab values.  Discharge Instructions    Diet - low sodium heart healthy    Complete by:  As directed   Diet modification per swallow eval result     Increase activity slowly    Complete by:  As directed             Medication List    STOP taking these medications        cyanocobalamin 1000 MCG tablet     tuberculin 5 UNIT/0.1ML injection      TAKE these medications        acetaminophen 325 MG tablet  Commonly known as:  TYLENOL    Take 650 mg by mouth every 6 (six) hours as needed (pain).     ADVAIR DISKUS 500-50 MCG/DOSE Aepb  Generic drug:  Fluticasone-Salmeterol  Inhale 1 puff into the lungs every 12 (twelve) hours.     albuterol (2.5 MG/3ML) 0.083% nebulizer solution  Commonly known as:  PROVENTIL  Take 3 mLs (2.5 mg total) by nebulization every 4 (four) hours as needed for wheezing.     amoxicillin-clavulanate 875-125 MG tablet  Commonly known as:  AUGMENTIN  Take 1 tablet by mouth 2 (two) times daily.     aspirin EC 81 MG tablet  Take 1 tablet (81 mg total) by mouth every morning.     atorvastatin 20 MG tablet  Commonly known as:  LIPITOR  Take 20 mg by mouth daily.     capsicum 0.075 % topical cream  Commonly known as:  ZOSTRIX  Apply topically 2 (two) times daily.     carvedilol 6.25 MG tablet  Commonly known as:  COREG  Take 1 tablet (6.25 mg total) by mouth 2 (two) times daily with a meal.     cholecalciferol 1000 UNITS tablet  Commonly known as:  VITAMIN D  Take 1,000 Units by mouth daily at 6 PM.     diclofenac sodium 1 % Gel  Commonly known as:  VOLTAREN  Apply 4 g topically 3 (three) times daily.     feeding supplement (ENSURE ENLIVE) Liqd  Take 237 mLs by mouth 2 (two) times daily between meals.     ferrous sulfate 325 (65 FE) MG tablet  Take 325 mg by mouth every morning.     furosemide 40 MG tablet  Commonly known as:  LASIX  Take 1 tablet (40 mg total) by mouth daily.     HYDROcodone-acetaminophen 5-325 MG tablet  Commonly known as:  NORCO/VICODIN  Take 1-2 tablets by mouth every 4 (four) hours as needed for moderate pain.     isosorbide mononitrate 30 MG 24 hr tablet  Commonly known as:  IMDUR  Take 1 tablet (30 mg total) by mouth daily.     lactobacilus acidophilus & bulgar Tabs chewable tablet  Take 1 tablet by mouth 3 (three) times daily with meals.     levothyroxine 50 MCG tablet  Commonly known as:  SYNTHROID, LEVOTHROID  Take 50 mcg by mouth every morning.      lisinopril 5 MG tablet  Commonly known as:  PRINIVIL,ZESTRIL  Take 1 tablet (5 mg total) by mouth daily.     oxymetazoline 0.05 % nasal spray  Commonly known as:  AFRIN  Place 1 spray into both nostrils 2 (two) times daily as needed (nose bleed).     ranitidine 150 MG tablet  Commonly known as:  ZANTAC  Take 150 mg by mouth daily.  No Known Allergies     Follow-up Information    Follow up with KILROY,LUKE K, PA-C In 3 weeks.   Specialties:  Cardiology, Radiology   Why:  chf, pericardioeffusion   Contact information:   [place] [id number] [phone]        The results of significant diagnostics from this hospitalization (including imaging, microbiology, ancillary and laboratory) are listed below for reference.    Significant Diagnostic Studies: Ct Abdomen Pelvis Wo Contrast  07/27/2015  CLINICAL DATA:  Patient has urinary incontinence. Patient has a history of colon cancer status post resection with colostomy. EXAM: CT ABDOMEN AND PELVIS WITHOUT CONTRAST TECHNIQUE: Multidetector CT imaging of the abdomen and pelvis was performed following the standard protocol without IV contrast. COMPARISON:  April 15, 2009 FINDINGS: The liver, spleen, pancreas, adrenal glands and kidneys are normal. There is no nephrolithiasis or hydroureteronephrosis bilaterally. There is minimal right perinephric stranding, nonspecific. A gallstones in the gallbladder. There is atherosclerosis of the abdominal aorta without aneurysmal dilatation. There is no abdominal lymphadenopathy. There is no small bowel obstruction. Patient is status post prior colectomy with left lower quadrant ostomy present. Fluid-filled bladder is normal. Patient is status post prior hysterectomy. Pelvic phleboliths are identified. The heart size is enlarged. There is a small pericardial effusion. There is mild atelectasis of the posterior lung bases. Degenerative joint changes of the spine are  noted. IMPRESSION: No acute abnormality identified in the abdomen and pelvis. Mild atelectasis of bilateral lung bases. Cardiomegaly with pericardial effusion. Electronically Signed   By: Abelardo Diesel M.D.   On: 07/27/2015 14:24   Dg Chest 2 View  07/23/2015  CLINICAL DATA:  Generalized weakness EXAM: CHEST  2 VIEW COMPARISON:  04/25/2015 FINDINGS: Cardiomegaly again noted. Central mild vascular congestion without convincing pulmonary edema. Dual lead cardiac pacemaker is unchanged in position. Mild left basilar atelectasis. Thoracic spine osteopenia and mild degenerative changes. IMPRESSION: Central mild vascular congestion without pulmonary edema. Cardiomegaly again noted. Mild left basilar atelectasis. Dual lead cardiac pacemaker in place. Electronically Signed   By: Lahoma Crocker M.D.   On: 07/23/2015 18:48   Ct Chest Wo Contrast  07/26/2015  CLINICAL DATA:  Wheezing and shortness of breath. EXAM: CT CHEST WITHOUT CONTRAST TECHNIQUE: Multidetector CT imaging of the chest was performed following the standard protocol without IV contrast. COMPARISON:  03/19/2015 FINDINGS: Mediastinum: Moderate cardiac enlargement. There is a left chest wall pacer device with leads in the right atrial appendage and right ventricle. Small pericardial effusion identified, image 37/series 2. There is aortic atherosclerosis. Prominent mediastinal lymph nodes are noted without evidence for adenopathy. On today's exam there is decreased AP diameter of the trachea, a manifestation of tracheomalacia. Unremarkable appearance of the esophagus. Lungs/Pleura: There is trace pleural fluid identified bilaterally. Mild mosaic attenuation pattern is identified bilaterally. This appears lower lung zone predominant. No airspace consolidation or interstitial edema noted. 4 mm right upper lobe lung nodule is identified, image 15 of series 5. Stable appearance of partially calcified subpleural nodule overlying the right middle lobe, image 28 of  series 5. Upper Abdomen: Stones identified within the dependent portion of the gallbladder. Musculoskeletal: Multi level spondylosis identified within the thoracic spine. No aggressive lytic or sclerotic bone lesions. No fractures noted. IMPRESSION: 1. Mosaic attenuation pattern is identified in both lungs which may be a manifestation of small airways disease but could also be seen with chronic pulmonary embolus. 2. 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 3.  Aortic atherosclerosis 4. Small pericardial effusion 5. Gallstones. Electronically Signed   By: Kerby Moors M.D.   On: 07/26/2015 11:04   Nm Pulmonary Perf And Vent  07/26/2015  CLINICAL DATA:  Shortness of breath for 3 days, nonsmoker, asthma, hypertension, CHF, COPD, colon cancer, hyperlipidemia, prior pulmonary embolism, diabetes mellitus EXAM: NUCLEAR MEDICINE VENTILATION - PERFUSION LUNG SCAN TECHNIQUE: Ventilation images were obtained in multiple projections using inhaled aerosol Tc-87m DTPA. Perfusion images were obtained in multiple projections after intravenous injection of Tc-61m MAA. RADIOPHARMACEUTICALS:  33 mCi Technetium-34m DTPA aerosol inhalation and 4 mCi Technetium-68m MAA IV COMPARISON:  None FINDINGS: Ventilation: Photopenic defect from pacemaker generator. Suboptimal aerosol distribution within the lungs. Diminished ventilation LEFT lower lobe. No other definite ventilatory defects. Elevation of RIGHT diaphragm. Enlargement of cardiac silhouette. Perfusion: Enlargement of cardiac silhouette. Elevation of RIGHT diaphragm. Photopenic defect from pacemaker generator. No segmental or subsegmental perfusion defects identified. Chest radiograph: Enlargement of cardiac silhouette with pulmonary vascular congestion. Pacemaker/AICD. IMPRESSION: Very low probability for pulmonary embolism. Electronically Signed   By: Lavonia Dana M.D.   On: 07/26/2015 16:33    Microbiology: Recent Results (from the past 240  hour(s))  Urine culture     Status: None   Collection Time: 07/23/15  4:10 PM  Result Value Ref Range Status   Specimen Description URINE, CLEAN CATCH  Final   Special Requests NONE  Final   Culture   Final    NO GROWTH 2 DAYS Performed at [facility]    Report Status 07/25/2015 FINAL  Final  Blood Culture (routine x 2)     Status: None   Collection Time: 07/23/15  9:05 PM  Result Value Ref Range Status   Specimen Description BLOOD BLOOD LEFT HAND  Final   Special Requests BOTTLES DRAWN AEROBIC AND ANAEROBIC 5ML  Final   Culture   Final    NO GROWTH 5 DAYS Performed at [facility]    Report Status 07/28/2015 FINAL  Final  Blood Culture (routine x 2)     Status: None   Collection Time: 07/23/15  9:08 PM  Result Value Ref Range Status   Specimen Description BLOOD LEFT ANTECUBITAL  Final   Special Requests BOTTLES DRAWN AEROBIC AND ANAEROBIC 5ML  Final   Culture   Final    NO GROWTH 5 DAYS Performed at [facility]    Report Status 07/28/2015 FINAL  Final  MRSA PCR Screening     Status: None   Collection Time: 07/24/15  4:58 AM  Result Value Ref Range Status   MRSA by PCR NEGATIVE NEGATIVE Final    Comment:        The GeneXpert MRSA Assay (FDA approved for NASAL specimens only), is one component of a comprehensive MRSA colonization surveillance program. It is not intended to diagnose MRSA infection nor to guide or monitor treatment for MRSA infections.   Culture, blood (x 2)     Status: None (Preliminary result)   Collection Time: 07/27/15  1:00 PM  Result Value Ref Range Status   Specimen Description BLOOD RIGHT ARM  Final   Special Requests IN PEDIATRIC BOTTLE 3CC  Final   Culture   Final    NO GROWTH < 24 HOURS Performed at [facility]    Report Status PENDING  Incomplete  Culture, blood (x 2)     Status: None (Preliminary result)   Collection Time: 07/27/15  1:13 PM  Result Value Ref Range Status   Specimen  Description BLOOD  RIGHT HAND  Final   Special Requests IN PEDIATRIC BOTTLE 3CC  Final   Culture   Final    NO GROWTH < 24 HOURS Performed at [facility]    Report Status PENDING  Incomplete     Labs: Basic Metabolic Panel:  Recent Labs Lab 07/24/15 0425 07/25/15 0525 07/26/15 0432 07/27/15 0434 07/29/15 0422  NA 137 140 138 140 141  K 3.2* 3.3* 3.6 3.6 3.7  CL 104 104 103 101 104  CO2 24 28 26 26 27   GLUCOSE 79 96 87 79 87  BUN 16 19 20 20  21*  CREATININE 1.06* 1.27* 1.24* 1.13* 1.11*  CALCIUM 8.9 9.0 9.0 9.6 9.5  MG 1.6* 2.1 1.9  --  1.8  PHOS 3.7  --   --   --   --    Liver Function Tests:  Recent Labs Lab 07/23/15 1708 07/24/15 0425 07/29/15 0422  AST 22 17 20   ALT 13* 8* 13*  ALKPHOS 49 36* 45  BILITOT 0.8 1.0 0.5  PROT 8.3* 6.7 7.2  ALBUMIN 4.1 3.3* 3.1*   No results for input(s): LIPASE, AMYLASE in the last 168 hours. No results for input(s): AMMONIA in the last 168 hours. CBC:  Recent Labs Lab 07/23/15 1708 07/24/15 0425 07/25/15 0525 07/26/15 0432 07/27/15 0434 07/29/15 0422  WBC 6.4 6.4 5.0 4.5 5.1 4.6  NEUTROABS 3.2  --   --   --   --  2.5  HGB 11.0* 9.6* 9.2* 8.5* 9.1* 9.2*  HCT 35.2* 31.3* 29.6* 27.8* 29.9* 30.3*  MCV 88.9 88.4 89.2 87.7 89.3 89.9  PLT 278 235 211 199 230 276   Cardiac Enzymes: No results for input(s): CKTOTAL, CKMB, CKMBINDEX, TROPONINI in the last 168 hours. BNP: BNP (last 3 results)  Recent Labs  03/18/15 0305 04/25/15 1924  BNP 1270.3* 1336.1*    ProBNP (last 3 results) No results for input(s): PROBNP in the last 8760 hours.  CBG: No results for input(s): GLUCAP in the last 168 hours.     SignedFlorencia Reasons MD, PhD  [facility] 07/29/2015, 9:01 AM

## 2015-07-29 NOTE — Evaluation (Signed)
Clinical/Bedside Swallow Evaluation Patient Details  Name: [name] MRN: [id number] Date of Birth: 04/01/1934  Today's Date: 07/29/2015 Time: SLP Start Time (ACUTE ONLY): 6767 SLP Stop Time (ACUTE ONLY): 2094 SLP Time Calculation (min) (ACUTE ONLY): 25 min  Past Medical History:  Past Medical History  Diagnosis Date  . Hypertension   . Asthma   . CHF (congestive heart failure) ([place])   . COPD (chronic obstructive pulmonary disease) ([place])   . Colon cancer ([place])   . Hyperlipidemia   . Heart murmur   . Anginal pain ([place])   . Pulmonary embolism ([place])   . Pneumonia     "just once" (03/01/2015)  . Hypothyroidism   . History of blood transfusion     "related to anemia, this is my 1st" (03/01/2015)  . Arthritis     "comes and goes" (03/01/2015)  . Renal disorder   . Colostomy care ([place])   . Diabetes mellitus without complication ([place])     pt denies this hx on 03/01/2015   Past Surgical History:  Past Surgical History  Procedure Laterality Date  . Cholecystectomy    . Colon surgery      cancer  . Fracture surgery    . Dilation and curettage of uterus    . Tubal ligation    . Cataract extraction w/ intraocular lens  implant, bilateral Bilateral   . Ankle fracture surgery Left   . Abdominal hysterectomy    . Cardiac catheterization N/A 03/22/2015    Procedure: Right/Left Heart Cath and Coronary Angiography;  Surgeon: Leonie Man, MD;  Location: [facility];  Service: Cardiovascular;  Laterality: N/A;   HPI:  81 yo female adm to [facility] with sepsis.  Pt has h/o colon cancer, COPD, HTN, dementia.  Swallow evaluation ordered upon admit.  CXR showed mild bilateral ATX at lung bases.  Intake listed as 30-60 percent.  ? tracheomalacia per imaging study.  Pt admits to choking on applejuice today but denies recalling prior episodes.     Assessment / Plan / Recommendation Clinical Impression  Pt presents with minimal cognitive based dysphagia resulting in delay oral transiting  vs oral holding.  No s/s of aspiration with po intake observed and no focal CN deficits noted.  Pt able to self feed which maximizes airway protection.    Pt admits to choking on applejuice today, advised her to use caution with intake assuring she is sitting fully upright and alert for intake.  She denies episode occuring during meal and states was early this am.   Note pt with silght whitish/greenish coating on superior tongue.  Recommend monitor closely to assure pt not acquiring thrush.       Aspiration Risk  Mild    Diet Recommendation Age appropriate regular solids;Thin   Medication Administration: Whole meds with liquid    Other  Recommendations Oral Care Recommendations: Oral care BID   Follow Up Recommendations       Frequency and Duration        Pertinent Vitals/Pain Afebrile, decreased      Swallow Study Prior Functional Status   see hhx    General Date of Onset: 07/29/15 Other Pertinent Information: 81 yo female adm to [facility] with sepsis.  Pt has h/o colon cancer, COPD, HTN, dementia.  Swallow evaluation ordered upon admit.  CXR showed mild bilateral ATX at lung bases.  Intake listed as 30-60 percent.  ? tracheomalacia per imaging study.  Pt admits to choking on applejuice today but  denies recalling prior episodes.   Type of Study: Bedside swallow evaluation Diet Prior to this Study: Regular;Thin liquids Temperature Spikes Noted: No Respiratory Status: Room air History of Recent Intubation: No Behavior/Cognition: Alert;Cooperative;Pleasant mood Oral Cavity - Dentition: Poor condition;Edentulous Self-Feeding Abilities: Able to feed self Patient Positioning: Upright in bed Baseline Vocal Quality: Normal Volitional Cough: Cognitively unable to elicit Volitional Swallow: Unable to elicit    Oral/Motor/Sensory Function Overall Oral Motor/Sensory Function: Appears within functional limits for tasks assessed   Ice Chips Ice chips: Not tested   Thin Liquid Thin Liquid:  Impaired Presentation: Cup;Self Fed;Straw Oral Phase Impairments: Other (comment) (prolonged oral transiting with multiple swallows) Oral Phase Functional Implications: Prolonged oral transit;Oral holding (oral delay )    Nectar Thick Nectar Thick Liquid: Not tested   Honey Thick Honey Thick Liquid: Not tested   Puree Puree: Within functional limits Presentation: Self Fed;Spoon   Solid   GO    Solid: Impaired Presentation: Self Fed Oral Phase Impairments: Impaired anterior to posterior transit;Poor awareness of bolus Oral Phase Functional Implications: Other (comment) (prolonged oral transiting)       Luanna Salk, [facility] SLP [phone]

## 2015-07-29 NOTE — Care Management Important Message (Signed)
Important Message  Patient Details  Name: [name] MRN: [id number] Date of Birth: 1933-12-12   Medicare Important Message Given:  Yes-third notification given    Camillo Flaming 07/29/2015, 11:18 AMImportant Message  Patient Details  Name: [name] MRN: [id number] Date of Birth: 1934/02/26   Medicare Important Message Given:  Yes-third notification given    Camillo Flaming 07/29/2015, 11:18 AM

## 2015-07-29 NOTE — Progress Notes (Signed)
Occupational Therapy Treatment Patient Details Name: [name] MRN: [id number] DOB: 1934-01-03 Today's Date: 07/29/2015    History of present illness 81 y.o. female with COPD, DM, HLD, systolic CHF with last EF 12 - 40% in June 2016, colon cancer and status post resection and now with colostomy, presented from SNF for evaluation of confusion and urinary incontinence, unable to ambulate with walker   OT comments  Pt states she is going home today.  Back to ALF per chart with HH.             Precautions / Restrictions Precautions Precautions: Fall Precaution Comments: colostomy Restrictions Weight Bearing Restrictions: No       Mobility Bed Mobility Overal bed mobility: Needs Assistance Bed Mobility: Supine to Sit     Supine to sit: Mod assist        Transfers Overall transfer level: Needs assistance Equipment used: None;1 person hand held assist Transfers: Sit to/from [employer] Transfers Sit to Stand: Mod assist Stand pivot transfers: Mod assist                ADL                           Toilet Transfer: Maximal assistance;Stand-pivot;Cueing for safety;BSC   Toileting- Clothing Manipulation and Hygiene: Maximal assistance;Sit to/from stand                          Cognition   Behavior During Therapy: Flat affect Overall Cognitive Status: Within Functional Limits for tasks assessed                                    Pertinent Vitals/ Pain          Home Living Family/patient expects to be discharged to:: Skilled nursing facility                                 Additional Comments: pt from ALF      Prior Functioning/Environment Level of Independence: Needs assistance  Gait / Transfers Assistance Needed: States she ambulates with assist and RW per previous notes in chart--pt reports she is mod I this adm--? ADL's / Homemaking Assistance Needed: assisted for LB bathing and dressing and for  toileting, could perform grooming, self feeding (per previous notes)       Frequency       Progress Toward Goals  OT Goals(current goals can now be found in the care plan section)  Progress towards OT goals: Progressing toward goals     Plan Discharge plan remains appropriate    Co-evaluation                 End of Session     Activity Tolerance Patient tolerated treatment well   Patient Left Other (comment) (on BSC with CNA)   Nurse Communication Mobility status        Time: 1050-1107 OT Time Calculation (min): 17 min  Charges: OT General Charges $OT Visit: 1 Procedure OT Treatments $Self Care/Home Management : 8-22 mins  Nayshawn Mesta D 07/29/2015, 11:13 AM

## 2015-08-01 LAB — CULTURE, BLOOD (ROUTINE X 2)
Culture: NO GROWTH
Culture: NO GROWTH

## 2015-08-11 ENCOUNTER — Ambulatory Visit (INDEPENDENT_AMBULATORY_CARE_PROVIDER_SITE_OTHER): Payer: Medicare HMO | Admitting: Cardiology

## 2015-08-11 ENCOUNTER — Encounter: Payer: Self-pay | Admitting: Cardiology

## 2015-08-11 VITALS — BP 150/80 | HR 96 | Ht 66.0 in | Wt 185.4 lb

## 2015-08-11 DIAGNOSIS — I5023 Acute on chronic systolic (congestive) heart failure: Secondary | ICD-10-CM

## 2015-08-11 DIAGNOSIS — IMO0001 Reserved for inherently not codable concepts without codable children: Secondary | ICD-10-CM | POA: Insufficient documentation

## 2015-08-11 DIAGNOSIS — Z79899 Other long term (current) drug therapy: Secondary | ICD-10-CM

## 2015-08-11 DIAGNOSIS — I10 Essential (primary) hypertension: Secondary | ICD-10-CM

## 2015-08-11 DIAGNOSIS — Z95810 Presence of automatic (implantable) cardiac defibrillator: Secondary | ICD-10-CM

## 2015-08-11 DIAGNOSIS — N183 Chronic kidney disease, stage 3 unspecified: Secondary | ICD-10-CM

## 2015-08-11 DIAGNOSIS — Z0389 Encounter for observation for other suspected diseases and conditions ruled out: Secondary | ICD-10-CM | POA: Diagnosis not present

## 2015-08-11 DIAGNOSIS — I428 Other cardiomyopathies: Secondary | ICD-10-CM | POA: Insufficient documentation

## 2015-08-11 DIAGNOSIS — J438 Other emphysema: Secondary | ICD-10-CM

## 2015-08-11 MED ORDER — FUROSEMIDE 40 MG PO TABS: 40.0000 mg | ORAL_TABLET | ORAL | Status: DC

## 2015-08-11 NOTE — Assessment & Plan Note (Signed)
Admitted to [facility] 07/23/15-07/29/15, cultures negative.

## 2015-08-11 NOTE — Assessment & Plan Note (Signed)
GFR 53

## 2015-08-11 NOTE — Assessment & Plan Note (Signed)
MDT device implanted in '05 in [place] 

## 2015-08-11 NOTE — Assessment & Plan Note (Signed)
EF 35%-40% by echo June 2016 She appears a little volume overloaded today

## 2015-08-11 NOTE — Patient Instructions (Signed)
Kerin Ransom, [place], has recommended making the following medication changes: INCREASE Furosemide (Lasix) to 60 mg on Mondays, Wednesdays, and Fridays. Continue 40 mg on all other days.  Your physician recommends that you return for lab work in 2 weeks.  Your physician recommends that you schedule a follow-up appointment in first available with Dr Sallyanne Kuster to establish cardiology care.  If you need a refill on your cardiac medications before your next appointment, please call your pharmacy.

## 2015-08-11 NOTE — Progress Notes (Signed)
08/11/2015 [name] Shari Prows   1934-04-20  [id number]  Primary Physician Default, Provider, MD Primary Cardiologist: Dr Sallyanne Kuster (new)  HPI:  81 y/o AA female who was seen in consult by Dr Meda Coffee in June 2016 for CHF. She was sent from a SNF ([address] [place]) to the ED in June. There is a history of an ICD implanted in [place] in 2005 but the pt doesn't know who checks it or why she has it. It was interrogated during her hospital stay and reportedly was functioning normally. The pt had an echo showing her EF to be 35-40%. She had a cath 03/22/15 that showed normal coronaries. She was placed on medication and seen on f/u 04/14/15. She ended up back in the hospital 7/17-7/20/16 with CHF exacerbation. She is here now after a recent admission for sepsis -fever but culture negative-. She seems stable though she does c/o of DOE.   Current Outpatient Prescriptions  Medication Sig Dispense Refill  . acetaminophen (TYLENOL) 325 MG tablet Take 650 mg by mouth every 6 (six) hours as needed (pain).    . ADVAIR DISKUS 500-50 MCG/DOSE AEPB Inhale 1 puff into the lungs every 12 (twelve) hours.  3  . albuterol (PROVENTIL) (2.5 MG/3ML) 0.083% nebulizer solution Take 3 mLs (2.5 mg total) by nebulization every 4 (four) hours as needed for wheezing. 75 mL 12  . aspirin EC 81 MG tablet Take 1 tablet (81 mg total) by mouth every morning.    Marland Kitchen atorvastatin (LIPITOR) 20 MG tablet Take 20 mg by mouth daily.     . capsicum (ZOSTRIX) 0.075 % topical cream Apply topically 2 (two) times daily. 28.3 g 0  . carvedilol (COREG) 6.25 MG tablet Take 1 tablet (6.25 mg total) by mouth 2 (two) times daily with a meal.    . cholecalciferol (VITAMIN D) 1000 UNITS tablet Take 1,000 Units by mouth daily at 6 PM.    . diclofenac sodium (VOLTAREN) 1 % GEL Apply 4 g topically 3 (three) times daily.    . feeding supplement, ENSURE ENLIVE, (ENSURE ENLIVE) LIQD Take 237 mLs by mouth 2 (two) times daily between meals. 237 mL 12  . ferrous sulfate 325 (65  FE) MG tablet Take 325 mg by mouth every morning.    . furosemide (LASIX) 40 MG tablet Take 1-1.5 tablets (40-60 mg total) by mouth as directed. 30 tablet 5  . HYDROcodone-acetaminophen (NORCO/VICODIN) 5-325 MG tablet Take 1-2 tablets by mouth every 4 (four) hours as needed for moderate pain. 30 tablet 0  . isosorbide mononitrate (IMDUR) 30 MG 24 hr tablet Take 1 tablet (30 mg total) by mouth daily. 30 tablet 0  . lactobacilus acidophilus & bulgar (FLORANEX) TABS chewable tablet Take 1 tablet by mouth 3 (three) times daily with meals. 30 tablet 0  . levothyroxine (SYNTHROID, LEVOTHROID) 50 MCG tablet Take 50 mcg by mouth every morning.   2  . lisinopril (PRINIVIL,ZESTRIL) 5 MG tablet Take 1 tablet (5 mg total) by mouth daily. 30 tablet 0  . oxymetazoline (AFRIN) 0.05 % nasal spray Place 1 spray into both nostrils 2 (two) times daily as needed (nose bleed). 30 mL 0  . ranitidine (ZANTAC) 150 MG tablet Take 150 mg by mouth daily.   11   No current facility-administered medications for this visit.    No Known Allergies  Social History   Social History  . Marital Status: Divorced    Spouse Name: N/A  . Number of Children: N/A  . Years of  Education: N/A   Occupational History  . Retired Psychologist, counselling    Social History Main Topics  . Smoking status: Never Smoker   . Smokeless tobacco: Never Used  . Alcohol Use: No  . Drug Use: No  . Sexual Activity: No   Other Topics Concern  . Not on file   Social History Narrative     Review of Systems: General: negative for chills, fever, night sweats or weight changes.  Cardiovascular: negative for chest pain, dyspnea on exertion, edema, orthopnea, palpitations, paroxysmal nocturnal dyspnea or shortness of breath Dermatological: negative for rash Respiratory: negative for cough or wheezing Urologic: negative for hematuria Abdominal: negative for nausea, vomiting, diarrhea, bright red blood per rectum, melena, or hematemesis Neurologic:  negative for visual changes, syncope, or dizziness All other systems reviewed and are otherwise negative except as noted above.    Blood pressure 150/80, pulse 96, height 5\' 6"  (1.676 m), weight 185 lb 6.4 oz (84.097 kg).  General appearance: alert, cooperative and no distress Neck: no carotid bruit and no JVD Lungs: bi basilar crackles Heart: regular rate and rhythm and short SEM at LSB Extremities: 1+ edema LE Skin: Skin color, texture, turgor normal. No rashes or lesions Neurologic: Grossly normal  EKG NSR, IVCD  ASSESSMENT AND PLAN:   Sepsis ([employer]) Admitted to [facility] 07/23/15-07/29/15, cultures negative.  NICM (nonischemic cardiomyopathy) ([place]) EF 35%-40% by echo June 2016 She appears a little volume overloaded today  Normal coronary arteries June 13-2016  ICD in place MDT device implanted in '05 in [place]  CKD (chronic kidney disease) stage 3, GFR 30-59 ml/min GFR 53  Benign essential HTN Controlled  COPD (chronic obstructive pulmonary disease) ([name]) .   PLAN  I increased her Lasix to 60 mg MWF and 40 mg other days. She is supposed to have an appointment to get established with Dr Sallyanne Kuster- will try and make sure that happens. She should have a BMP in 2 weeks or so.   Kerin Ransom K PA-C 08/11/2015 2:23 PM

## 2015-08-11 NOTE — Assessment & Plan Note (Signed)
Controlled.  

## 2015-08-11 NOTE — Assessment & Plan Note (Signed)
June 13-2016

## 2015-08-18 ENCOUNTER — Telehealth: Payer: Self-pay

## 2015-08-18 NOTE — Telephone Encounter (Signed)
I called [facility] to ask if the order for Lasix had been taken care of.  The lady I spoke with stated that the patient was taking 40 mg daily at this time.  The new order of Lasix has not been sent.  It shows "no print" in the system. I am refaxing the order request to [facility] and putting it in the PA's box here at [facility].

## 2015-08-19 ENCOUNTER — Telehealth: Payer: Self-pay | Admitting: Cardiology

## 2015-08-19 NOTE — Telephone Encounter (Signed)
[name] is calling because there was a order written to increase [name] to 60mg  on MWF  and continue 40mg  all other days , the order was not signed. Please call   Thanks

## 2015-08-19 NOTE — Telephone Encounter (Signed)
[name] needs an order signed - she is faxing a sheet over for signature. She states she sent this last week but gave me fax number for [facility] office.  I gave her fax number for our office and she will fax today.

## 2015-08-19 NOTE — Telephone Encounter (Signed)
Dr. Loletha Grayer will be in tomorrow.  I will have him sign and then fax out.

## 2015-08-23 ENCOUNTER — Encounter (HOSPITAL_COMMUNITY): Payer: Self-pay

## 2015-08-23 ENCOUNTER — Emergency Department (HOSPITAL_COMMUNITY): Payer: Medicare HMO

## 2015-08-23 ENCOUNTER — Emergency Department (HOSPITAL_COMMUNITY)
Admission: EM | Admit: 2015-08-23 | Discharge: 2015-08-23 | Disposition: A | Discharge status: Home / Self Care | Payer: Medicare HMO | Attending: Emergency Medicine | Admitting: Emergency Medicine

## 2015-08-23 DIAGNOSIS — E119 Type 2 diabetes mellitus without complications: Secondary | ICD-10-CM | POA: Insufficient documentation

## 2015-08-23 DIAGNOSIS — Z79899 Other long term (current) drug therapy: Secondary | ICD-10-CM | POA: Diagnosis not present

## 2015-08-23 DIAGNOSIS — Z85038 Personal history of other malignant neoplasm of large intestine: Secondary | ICD-10-CM | POA: Insufficient documentation

## 2015-08-23 DIAGNOSIS — E039 Hypothyroidism, unspecified: Secondary | ICD-10-CM | POA: Insufficient documentation

## 2015-08-23 DIAGNOSIS — R011 Cardiac murmur, unspecified: Secondary | ICD-10-CM | POA: Diagnosis not present

## 2015-08-23 DIAGNOSIS — I509 Heart failure, unspecified: Secondary | ICD-10-CM | POA: Diagnosis not present

## 2015-08-23 DIAGNOSIS — M1990 Unspecified osteoarthritis, unspecified site: Secondary | ICD-10-CM | POA: Insufficient documentation

## 2015-08-23 DIAGNOSIS — Z8701 Personal history of pneumonia (recurrent): Secondary | ICD-10-CM | POA: Insufficient documentation

## 2015-08-23 DIAGNOSIS — Z7982 Long term (current) use of aspirin: Secondary | ICD-10-CM | POA: Diagnosis not present

## 2015-08-23 DIAGNOSIS — E785 Hyperlipidemia, unspecified: Secondary | ICD-10-CM | POA: Insufficient documentation

## 2015-08-23 DIAGNOSIS — J441 Chronic obstructive pulmonary disease with (acute) exacerbation: Secondary | ICD-10-CM | POA: Insufficient documentation

## 2015-08-23 DIAGNOSIS — R0602 Shortness of breath: Secondary | ICD-10-CM | POA: Diagnosis present

## 2015-08-23 DIAGNOSIS — I10 Essential (primary) hypertension: Secondary | ICD-10-CM | POA: Insufficient documentation

## 2015-08-23 LAB — COMPREHENSIVE METABOLIC PANEL
ALBUMIN: 3.6 g/dL (ref 3.5–5.0)
ALK PHOS: 57 U/L (ref 38–126)
ALT: 19 U/L (ref 14–54)
ANION GAP: 10 (ref 5–15)
AST: 30 U/L (ref 15–41)
BILIRUBIN TOTAL: 0.7 mg/dL (ref 0.3–1.2)
BUN: 17 mg/dL (ref 6–20)
CALCIUM: 9.5 mg/dL (ref 8.9–10.3)
CO2: 27 mmol/L (ref 22–32)
Chloride: 102 mmol/L (ref 101–111)
Creatinine, Ser: 1 mg/dL (ref 0.44–1.00)
GFR calc Af Amer: >60 mL/min (ref >60)
GFR calc non Af Amer: 52 mL/min — ABNORMAL LOW (ref >60)
GLUCOSE: 86 mg/dL (ref 65–99)
Potassium: 3.4 mmol/L — ABNORMAL LOW (ref 3.5–5.1)
SODIUM: 139 mmol/L (ref 135–145)
Total Protein: 7.7 g/dL (ref 6.5–8.1)

## 2015-08-23 LAB — CBC WITH DIFFERENTIAL/PLATELET
BASOS PCT: 0 %
Basophils Absolute: 0 10*3/uL (ref 0.0–0.1)
EOS PCT: 0 %
Eosinophils Absolute: 0 10*3/uL (ref 0.0–0.7)
HEMATOCRIT: 32 % — AB (ref 36.0–46.0)
Hemoglobin: 9.8 g/dL — ABNORMAL LOW (ref 12.0–15.0)
Lymphocytes Relative: 44 %
Lymphs Abs: 2 10*3/uL (ref 0.7–4.0)
MCH: 26.7 pg (ref 26.0–34.0)
MCHC: 30.6 g/dL (ref 30.0–36.0)
MCV: 87.2 fL (ref 78.0–100.0)
MONO ABS: 0.4 10*3/uL (ref 0.1–1.0)
MONOS PCT: 8 %
NEUTROS ABS: 2.2 10*3/uL (ref 1.7–7.7)
Neutrophils Relative %: 48 %
PLATELETS: 295 10*3/uL (ref 150–400)
RBC: 3.67 MIL/uL — ABNORMAL LOW (ref 3.87–5.11)
RDW: 16.9 % — AB (ref 11.5–15.5)
WBC: 4.6 10*3/uL (ref 4.0–10.5)

## 2015-08-23 LAB — I-STAT TROPONIN, ED: Troponin i, poc: 0.03 ng/mL (ref 0.00–0.08)

## 2015-08-23 LAB — BRAIN NATRIURETIC PEPTIDE: B Natriuretic Peptide: 2063.6 pg/mL — ABNORMAL HIGH (ref 0.0–100.0)

## 2015-08-23 MED ORDER — FUROSEMIDE 10 MG/ML IJ SOLN
40.0000 mg | Freq: Once | INTRAMUSCULAR | Status: AC
  Administered 2015-08-23: 40 mg via INTRAVENOUS
  Filled 2015-08-23: qty 4

## 2015-08-23 MED ORDER — IPRATROPIUM BROMIDE 0.02 % IN SOLN
0.5000 mg | Freq: Once | RESPIRATORY_TRACT | Status: AC
  Administered 2015-08-23: 0.5 mg via RESPIRATORY_TRACT
  Filled 2015-08-23: qty 2.5

## 2015-08-23 MED ORDER — ALBUTEROL SULFATE (2.5 MG/3ML) 0.083% IN NEBU
5.0000 mg | INHALATION_SOLUTION | Freq: Once | RESPIRATORY_TRACT | Status: AC
  Administered 2015-08-23: 5 mg via RESPIRATORY_TRACT
  Filled 2015-08-23: qty 6

## 2015-08-23 NOTE — ED Notes (Signed)
[facility]-Pt. Coming from assisted living with c/o SOB since last night. Pt. Given neb an hour before EMS arrival with pt. Reporting relief. Pt. AOx4 and denies CP/N/V. Pt. Pt. Also reports generalized weakness starting last night. Hx of Afib and has a demand pacer and colostomy. Vitals en route 166/90 mmHg, 16RR, 88bpm, 96% RA, and CBG of 124.

## 2015-08-23 NOTE — ED Notes (Signed)
EDP at bedside  

## 2015-08-23 NOTE — ED Provider Notes (Addendum)
CSN: [id number]     Arrival date & time 08/23/15  1241 History   First MD Initiated Contact with Patient 08/23/15 1244     Chief Complaint  Patient presents with  . Shortness of Breath     (Consider location/radiation/quality/duration/timing/severity/associated sxs/prior Treatment) HPI Comments: Patient is a 81 year old female with history of COPD, congestive heart failure, and renal insufficiency. She presents for evaluation of shortness of breath that started yesterday evening. She does report coughing up some mucus, however denies fevers or chills. She denies any chest pain.  Patient is a 81 y.o. female presenting with shortness of breath. The history is provided by the patient.  Shortness of Breath Severity:  Moderate Onset quality:  Sudden Duration:  1 day Timing:  Constant Progression:  Worsening Chronicity:  Recurrent Context: activity   Relieved by:  Nothing Worsened by:  Nothing tried Ineffective treatments:  None tried   Past Medical History  Diagnosis Date  . Hypertension   . Asthma   . CHF (congestive heart failure) ([place])   . COPD (chronic obstructive pulmonary disease) ([place])   . Colon cancer ([place])   . Hyperlipidemia   . Heart murmur   . Anginal pain ([place])   . Pulmonary embolism ([place])   . Pneumonia     "just once" (03/01/2015)  . Hypothyroidism   . History of blood transfusion     "related to anemia, this is my 1st" (03/01/2015)  . Arthritis     "comes and goes" (03/01/2015)  . Renal disorder   . Colostomy care ([place])   . Diabetes mellitus without complication ([place])     pt denies this hx on 03/01/2015   Past Surgical History  Procedure Laterality Date  . Cholecystectomy    . Colon surgery      cancer  . Fracture surgery    . Dilation and curettage of uterus    . Tubal ligation    . Cataract extraction w/ intraocular lens  implant, bilateral Bilateral   . Ankle fracture surgery Left   . Abdominal hysterectomy    . Cardiac catheterization N/A  03/22/2015    Procedure: Right/Left Heart Cath and Coronary Angiography;  Surgeon: Leonie Man, MD;  Location: [facility];  Service: Cardiovascular;  Laterality: N/A;   Family History  Problem Relation Age of Onset  . CAD    . Hypertension    . Stroke    . Colon cancer Neg Hx   . GI Bleed Neg Hx    Social History  Substance Use Topics  . Smoking status: Never Smoker   . Smokeless tobacco: Never Used  . Alcohol Use: No   OB History    No data available     Review of Systems  Respiratory: Positive for shortness of breath.   All other systems reviewed and are negative.     Allergies  Review of patient's allergies indicates no known allergies.  Home Medications   Prior to Admission medications   Medication Sig Start Date End Date Taking? Authorizing Provider  acetaminophen (TYLENOL) 325 MG tablet Take 650 mg by mouth every 6 (six) hours as needed (pain).    Historical Provider, MD  ADVAIR DISKUS 500-50 MCG/DOSE AEPB Inhale 1 puff into the lungs every 12 (twelve) hours. 12/28/14   Historical Provider, MD  albuterol (PROVENTIL) (2.5 MG/3ML) 0.083% nebulizer solution Take 3 mLs (2.5 mg total) by nebulization every 4 (four) hours as needed for wheezing. 04/28/15   Geradine Girt, DO  aspirin EC 81 MG tablet Take 1 tablet (81 mg total) by mouth every morning. 03/03/15   Ripudeep Krystal Eaton, MD  atorvastatin (LIPITOR) 20 MG tablet Take 20 mg by mouth daily.     Historical Provider, MD  capsicum (ZOSTRIX) 0.075 % topical cream Apply topically 2 (two) times daily. 03/02/15   Ripudeep Krystal Eaton, MD  carvedilol (COREG) 6.25 MG tablet Take 1 tablet (6.25 mg total) by mouth 2 (two) times daily with a meal. 03/04/15   Geradine Girt, DO  cholecalciferol (VITAMIN D) 1000 UNITS tablet Take 1,000 Units by mouth daily at 6 PM.    Historical Provider, MD  diclofenac sodium (VOLTAREN) 1 % GEL Apply 4 g topically 3 (three) times daily.    Historical Provider, MD  feeding supplement, ENSURE ENLIVE,  (ENSURE ENLIVE) LIQD Take 237 mLs by mouth 2 (two) times daily between meals. 03/02/15   Ripudeep Krystal Eaton, MD  ferrous sulfate 325 (65 FE) MG tablet Take 325 mg by mouth every morning.    Historical Provider, MD  furosemide (LASIX) 40 MG tablet Take 1-1.5 tablets (40-60 mg total) by mouth as directed. 08/11/15   Erlene Quan, PA-C  HYDROcodone-acetaminophen (NORCO/VICODIN) 5-325 MG tablet Take 1-2 tablets by mouth every 4 (four) hours as needed for moderate pain. 07/25/15   Theodis Blaze, MD  isosorbide mononitrate (IMDUR) 30 MG 24 hr tablet Take 1 tablet (30 mg total) by mouth daily. 03/24/15   Nishant Dhungel, MD  lactobacilus acidophilus & bulgar (FLORANEX) TABS chewable tablet Take 1 tablet by mouth 3 (three) times daily with meals. 07/29/15   Florencia Reasons, MD  levothyroxine (SYNTHROID, LEVOTHROID) 50 MCG tablet Take 50 mcg by mouth every morning.  01/16/15   Historical Provider, MD  lisinopril (PRINIVIL,ZESTRIL) 5 MG tablet Take 1 tablet (5 mg total) by mouth daily. 03/24/15   Nishant Dhungel, MD  oxymetazoline (AFRIN) 0.05 % nasal spray Place 1 spray into both nostrils 2 (two) times daily as needed (nose bleed). 03/02/15   Ripudeep Krystal Eaton, MD  ranitidine (ZANTAC) 150 MG tablet Take 150 mg by mouth daily.  01/14/15   Historical Provider, MD   BP 168/77 mmHg  Pulse 86  Temp(Src) 98 F (36.7 C) (Oral)  Resp 21  Ht 5\' 6"  (1.676 m)  Wt 185 lb (83.915 kg)  BMI 29.87 kg/m2  SpO2 96% Physical Exam  Constitutional: She is oriented to person, place, and time. She appears well-developed and well-nourished. No distress.  HENT:  Head: Normocephalic and atraumatic.  Neck: Normal range of motion. Neck supple.  Cardiovascular: Normal rate and regular rhythm.  Exam reveals no gallop and no friction rub.   No murmur heard. Pulmonary/Chest: Effort normal. No respiratory distress. She has no wheezes. She has rales.  There are rales in the bases bilaterally.  Abdominal: Soft. Bowel sounds are normal. She exhibits no  distension. There is no tenderness.  Musculoskeletal: Normal range of motion. She exhibits edema.  There is 2+ edema bilateral lower extremities. There is no calf tenderness and Homans sign is absent bilaterally.  Neurological: She is alert and oriented to person, place, and time.  Skin: Skin is warm and dry. She is not diaphoretic.  Nursing note and vitals reviewed.   ED Course  Procedures (including critical care time) Labs Review Labs Reviewed  COMPREHENSIVE METABOLIC PANEL  CBC WITH DIFFERENTIAL/PLATELET  BRAIN NATRIURETIC PEPTIDE  I-STAT [place], ED    Imaging Review No results found. I have personally reviewed and evaluated these  images and lab results as part of my medical decision-making.   EKG Interpretation   Date/Time:  Monday August 23 2015 12:42:40 EST Ventricular Rate:  88 PR Interval:  230 QRS Duration: 169 QT Interval:  450 QTC Calculation: 544 R Axis:   141 Text Interpretation:  Sinus rhythm Confirmed by Jalesha Plotz  MD, Elyjah Hazan ([id number])  on 08/23/2015 2:06:17 PM      MDM   Final diagnoses:  None    Patient with history of CHF presents with dyspnea for the past two days.  She has rales on exam, but no hypoxia and is no respiratory distress.  Workup reveals an elevated BNP and signs of CHF on her chest xray.  She was given iv lasix in the ED with a good diuresis.  She is saturating in the upper 90's on room air and remained mid-90's or better while ambulating.  I feel she is appropriated for discharge with an increased dose of lasix and prn return.  Her EKG shows no changes and troponin is negative.  I doubt an acute cardiac event and there is no objective evidence in the workup to suggest this.    Veryl Speak, MD 08/23/15 [place], MD 08/23/15 [phone]

## 2015-08-23 NOTE — ED Notes (Addendum)
Pt. Ambulated in hallway with O2 saturation never going below 98%. Pt. Did report feeling somewhat weak and dizzy during.

## 2015-08-23 NOTE — Discharge Instructions (Signed)
Increase your dose of Lasix to 40 mg twice daily for the next week, then resume your normal dose.    Return to the emergency department if you develop worsening breathing, chest pain, or other new or concerning symptoms.   Heart Failure Heart failure is a condition in which the heart has trouble pumping blood. This means your heart does not pump blood efficiently for your body to work well. In some cases of heart failure, fluid may back up into your lungs or you may have swelling (edema) in your lower legs. Heart failure is usually a long-term (chronic) condition. It is important for you to take good care of yourself and follow your health care provider's treatment plan. CAUSES  Some health conditions can cause heart failure. Those health conditions include:  High blood pressure (hypertension). Hypertension causes the heart muscle to work harder than normal. When pressure in the blood vessels is high, the heart needs to pump (contract) with more force in order to circulate blood throughout the body. High blood pressure eventually causes the heart to become stiff and weak.  Coronary artery disease (CAD). CAD is the buildup of cholesterol and fat (plaque) in the arteries of the heart. The blockage in the arteries deprives the heart muscle of oxygen and blood. This can cause chest pain and may lead to a heart attack. High blood pressure can also contribute to CAD.  Heart attack (myocardial infarction). A heart attack occurs when one or more arteries in the heart become blocked. The loss of oxygen damages the muscle tissue of the heart. When this happens, part of the heart muscle dies. The injured tissue does not contract as well and weakens the heart's ability to pump blood.  Abnormal heart valves. When the heart valves do not open and close properly, it can cause heart failure. This makes the heart muscle pump harder to keep the blood flowing.  Heart muscle disease (cardiomyopathy or myocarditis).  Heart muscle disease is damage to the heart muscle from a variety of causes. These can include drug or alcohol abuse, infections, or unknown reasons. These can increase the risk of heart failure.  Lung disease. Lung disease makes the heart work harder because the lungs do not work properly. This can cause a strain on the heart, leading it to fail.  Diabetes. Diabetes increases the risk of heart failure. High blood sugar contributes to high fat (lipid) levels in the blood. Diabetes can also cause slow damage to tiny blood vessels that carry important nutrients to the heart muscle. When the heart does not get enough oxygen and food, it can cause the heart to become weak and stiff. This leads to a heart that does not contract efficiently.  Other conditions can contribute to heart failure. These include abnormal heart rhythms, thyroid problems, and low blood counts (anemia). Certain unhealthy behaviors can increase the risk of heart failure, including:  Being overweight.  Smoking or chewing tobacco.  Eating foods high in fat and cholesterol.  Abusing illicit drugs or alcohol.  Lacking physical activity. SYMPTOMS  Heart failure symptoms may vary and can be hard to detect. Symptoms may include:  Shortness of breath with activity, such as climbing stairs.  Persistent cough.  Swelling of the feet, ankles, legs, or abdomen.  Unexplained weight gain.  Difficulty breathing when lying flat (orthopnea).  Waking from sleep because of the need to sit up and get more air.  Rapid heartbeat.  Fatigue and loss of energy.  Feeling light-headed, dizzy, or  close to fainting.  Loss of appetite.  Nausea.  Increased urination during the night (nocturia). DIAGNOSIS  A diagnosis of heart failure is based on your history, symptoms, physical examination, and diagnostic tests. Diagnostic tests for heart failure may include:  Echocardiography.  Electrocardiography.  Chest X-ray.  Blood  tests.  Exercise stress test.  Cardiac angiography.  Radionuclide scans. TREATMENT  Treatment is aimed at managing the symptoms of heart failure. Medicines, behavioral changes, or surgical intervention may be necessary to treat heart failure.  Medicines to help treat heart failure may include:  Angiotensin-converting enzyme (ACE) inhibitors. This type of medicine blocks the effects of a blood protein called angiotensin-converting enzyme. ACE inhibitors relax (dilate) the blood vessels and help lower blood pressure.  Angiotensin receptor blockers (ARBs). This type of medicine blocks the actions of a blood protein called angiotensin. Angiotensin receptor blockers dilate the blood vessels and help lower blood pressure.  Water pills (diuretics). Diuretics cause the kidneys to remove salt and water from the blood. The extra fluid is removed through urination. This loss of extra fluid lowers the volume of blood the heart pumps.  Beta blockers. These prevent the heart from beating too fast and improve heart muscle strength.  Digitalis. This increases the force of the heartbeat.  Healthy behavior changes include:  Obtaining and maintaining a healthy weight.  Stopping smoking or chewing tobacco.  Eating heart-healthy foods.  Limiting or avoiding alcohol.  Stopping illicit drug use.  Physical activity as directed by your health care provider.  Surgical treatment for heart failure may include:  A procedure to open blocked arteries, repair damaged heart valves, or remove damaged heart muscle tissue.  A pacemaker to improve heart muscle function and control certain abnormal heart rhythms.  An internal cardioverter defibrillator to treat certain serious abnormal heart rhythms.  A left ventricular assist device (LVAD) to assist the pumping ability of the heart. HOME CARE INSTRUCTIONS   Take medicines only as directed by your health care provider. Medicines are important in reducing  the workload of your heart, slowing the progression of heart failure, and improving your symptoms.  Do not stop taking your medicine unless directed by your health care provider.  Do not skip any dose of medicine.  Refill your prescriptions before you run out of medicine. Your medicines are needed every day.  Engage in moderate physical activity if directed by your health care provider. Moderate physical activity can benefit some people. The elderly and people with severe heart failure should consult with a health care provider for physical activity recommendations.  Eat heart-healthy foods. Food choices should be free of trans fat and low in saturated fat, cholesterol, and salt (sodium). Healthy choices include fresh or frozen fruits and vegetables, fish, lean meats, legumes, fat-free or low-fat dairy products, and whole grain or high fiber foods. Talk to a dietitian to learn more about heart-healthy foods.  Limit sodium if directed by your health care provider. Sodium restriction may reduce symptoms of heart failure in some people. Talk to a dietitian to learn more about heart-healthy seasonings.  Use healthy cooking methods. Healthy cooking methods include roasting, grilling, broiling, baking, poaching, steaming, or stir-frying. Talk to a dietitian to learn more about healthy cooking methods.  Limit fluids if directed by your health care provider. Fluid restriction may reduce symptoms of heart failure in some people.  Weigh yourself every day. Daily weights are important in the early recognition of excess fluid. You should weigh yourself every morning after you urinate  and before you eat breakfast. Wear the same amount of clothing each time you weigh yourself. Record your daily weight. Provide your health care provider with your weight record.  Monitor and record your blood pressure if directed by your health care provider.  Check your pulse if directed by your health care provider.  Lose  weight if directed by your health care provider. Weight loss may reduce symptoms of heart failure in some people.  Stop smoking or chewing tobacco. Nicotine makes your heart work harder by causing your blood vessels to constrict. Do not use nicotine gum or patches before talking to your health care provider.  Keep all follow-up visits as directed by your health care provider. This is important.  Limit alcohol intake to no more than 1 drink per day for nonpregnant women and 2 drinks per day for men. One drink equals 12 ounces of beer, 5 ounces of wine, or 1 ounces of hard liquor. Drinking more than that is harmful to your heart. Tell your health care provider if you drink alcohol several times a week. Talk with your health care provider about whether alcohol is safe for you. If your heart has already been damaged by alcohol or you have severe heart failure, drinking alcohol should be stopped completely.  Stop illicit drug use.  Stay up-to-date with immunizations. It is especially important to prevent respiratory infections through current pneumococcal and influenza immunizations.  Manage other health conditions such as hypertension, diabetes, thyroid disease, or abnormal heart rhythms as directed by your health care provider.  Learn to manage stress.  Plan rest periods when fatigued.  Learn strategies to manage high temperatures. If the weather is extremely hot:  Avoid vigorous physical activity.  Use air conditioning or fans or seek a cooler location.  Avoid caffeine and alcohol.  Wear loose-fitting, lightweight, and light-colored clothing.  Learn strategies to manage cold temperatures. If the weather is extremely cold:  Avoid vigorous physical activity.  Layer clothes.  Wear mittens or gloves, a hat, and a scarf when going outside.  Avoid alcohol.  Obtain ongoing education and support as needed.  Participate in or seek rehabilitation as needed to maintain or improve  independence and quality of life. SEEK MEDICAL CARE IF:   You have a rapid weight gain.  You have increasing shortness of breath that is unusual for you.  You are unable to participate in your usual physical activities.  You tire easily.  You cough more than normal, especially with physical activity.  You have any or more swelling in areas such as your hands, feet, ankles, or abdomen.  You are unable to sleep because it is hard to breathe.  You feel like your heart is beating fast (palpitations).  You become dizzy or light-headed upon standing up. SEEK IMMEDIATE MEDICAL CARE IF:   You have difficulty breathing.  There is a change in mental status such as decreased alertness or difficulty with concentration.  You have a pain or discomfort in your chest.  You have an episode of fainting (syncope). MAKE SURE YOU:   Understand these instructions.  Will watch your condition.  Will get help right away if you are not doing well or get worse.   This information is not intended to replace advice given to you by your health care provider. Make sure you discuss any questions you have with your health care provider.   Document Released: 09/25/2005 Document Revised: 02/09/2015 Document Reviewed: 10/25/2012 Elsevier Interactive Patient Education [employer].

## 2015-08-24 ENCOUNTER — Ambulatory Visit: Payer: Medicare HMO | Admitting: Family Medicine

## 2015-08-25 ENCOUNTER — Other Ambulatory Visit (INDEPENDENT_AMBULATORY_CARE_PROVIDER_SITE_OTHER): Payer: Medicare HMO

## 2015-08-25 DIAGNOSIS — I10 Essential (primary) hypertension: Secondary | ICD-10-CM | POA: Diagnosis not present

## 2015-09-27 IMAGING — CR DG CHEST 1V PORT
1 series · 1 of 1 positions shown · non-contrast
Comparison: CT dated 03/19/2015

CLINICAL DATA: 81-year-old female with shortness of breath

EXAM:
PORTABLE CHEST - 1 VIEW

[AP]
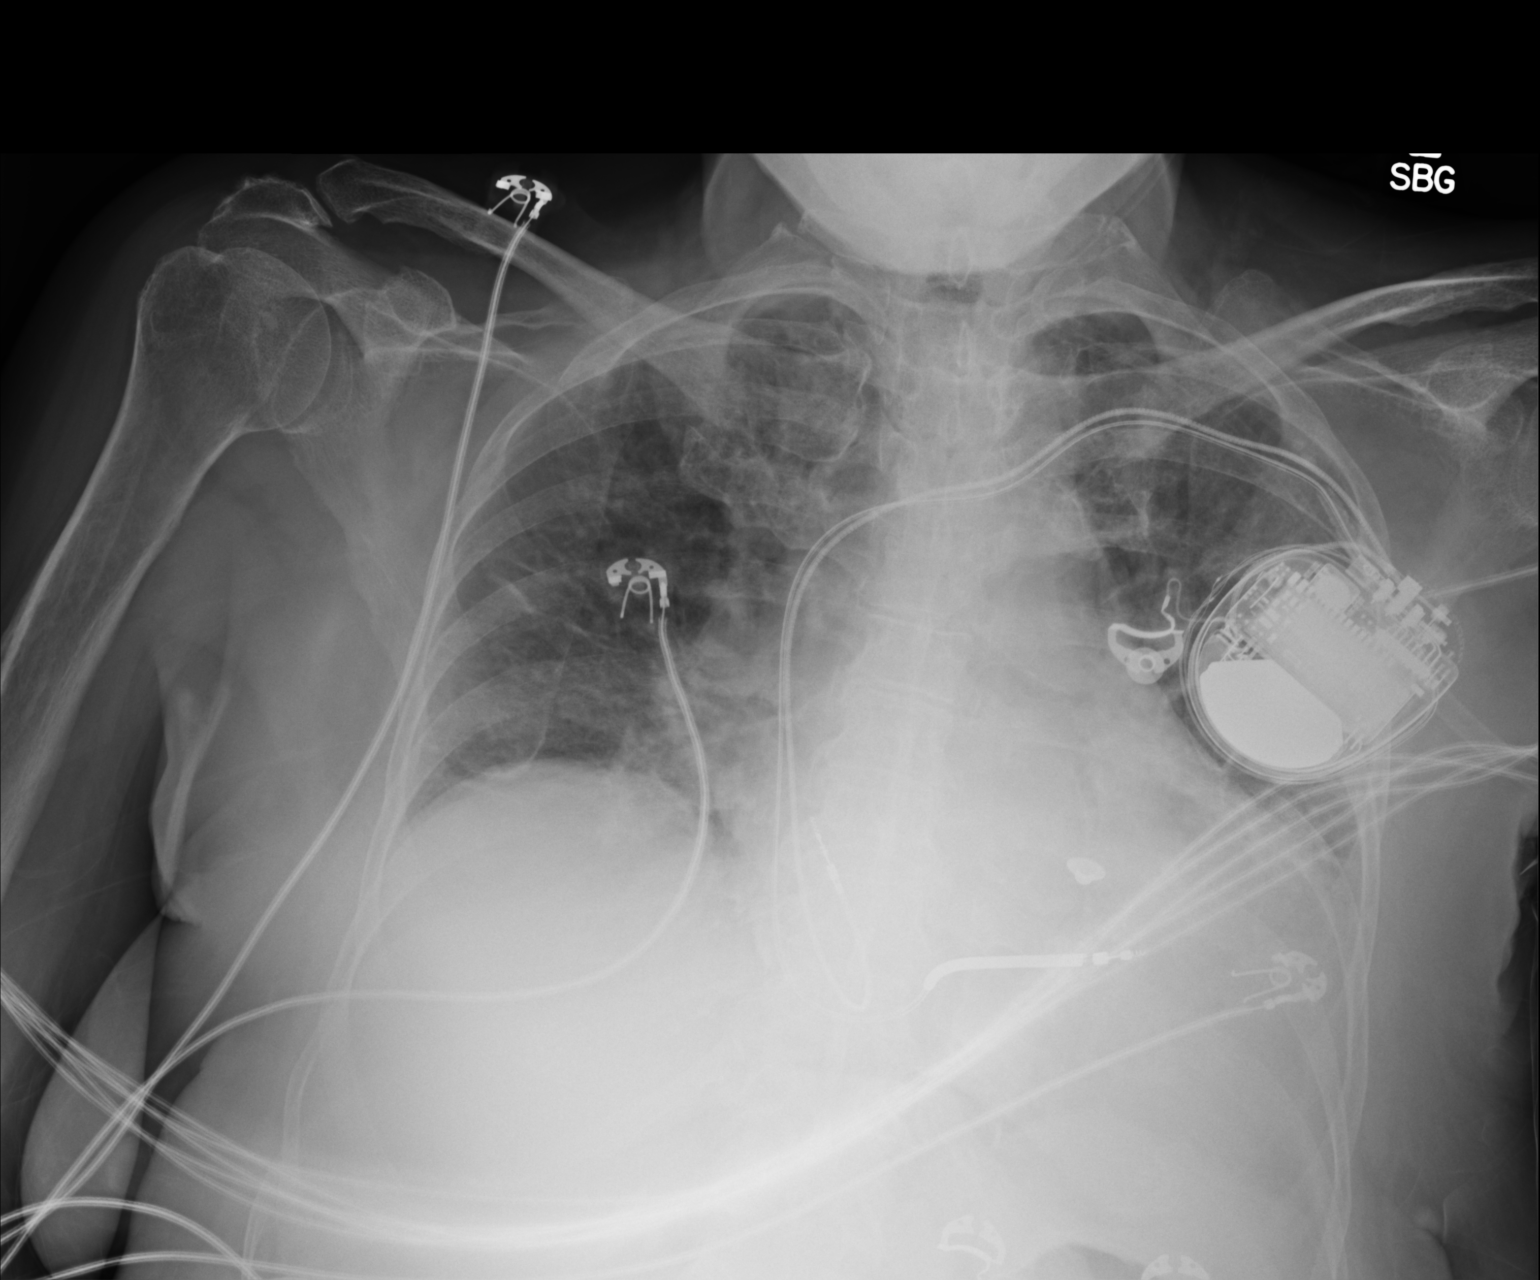

[1 of 1 positions shown; findings below may reference images not displayed]

FINDINGS: Single-view of the chest demonstrate hypovolemic but clear lungs. No
significant pleural effusion. No pneumothorax. Cardiomegaly. Left
pectoral pacemaker device. Degenerative changes of the spine.
IMPRESSION: Cardiomegaly.  No focal consolidation.

## 2015-10-01 ENCOUNTER — Inpatient Hospital Stay (HOSPITAL_COMMUNITY)
Admission: EM | Admit: 2015-10-01 | Discharge: 2015-10-05 | DRG: 291 | Disposition: A | Discharge status: Skilled Nursing Facility | Payer: Medicare HMO | Attending: Internal Medicine | Admitting: Internal Medicine

## 2015-10-01 ENCOUNTER — Emergency Department (HOSPITAL_COMMUNITY): Payer: Medicare HMO

## 2015-10-01 ENCOUNTER — Encounter (HOSPITAL_COMMUNITY): Payer: Self-pay | Admitting: Emergency Medicine

## 2015-10-01 DIAGNOSIS — E785 Hyperlipidemia, unspecified: Secondary | ICD-10-CM | POA: Diagnosis present

## 2015-10-01 DIAGNOSIS — I119 Hypertensive heart disease without heart failure: Secondary | ICD-10-CM

## 2015-10-01 DIAGNOSIS — I509 Heart failure, unspecified: Secondary | ICD-10-CM

## 2015-10-01 DIAGNOSIS — R109 Unspecified abdominal pain: Secondary | ICD-10-CM | POA: Diagnosis present

## 2015-10-01 DIAGNOSIS — R778 Other specified abnormalities of plasma proteins: Secondary | ICD-10-CM

## 2015-10-01 DIAGNOSIS — J9600 Acute respiratory failure, unspecified whether with hypoxia or hypercapnia: Secondary | ICD-10-CM | POA: Diagnosis present

## 2015-10-01 DIAGNOSIS — I5023 Acute on chronic systolic (congestive) heart failure: Secondary | ICD-10-CM | POA: Diagnosis present

## 2015-10-01 DIAGNOSIS — I5043 Acute on chronic combined systolic (congestive) and diastolic (congestive) heart failure: Secondary | ICD-10-CM | POA: Diagnosis not present

## 2015-10-01 DIAGNOSIS — Z85038 Personal history of other malignant neoplasm of large intestine: Secondary | ICD-10-CM

## 2015-10-01 DIAGNOSIS — I10 Essential (primary) hypertension: Secondary | ICD-10-CM

## 2015-10-01 DIAGNOSIS — J9601 Acute respiratory failure with hypoxia: Secondary | ICD-10-CM | POA: Diagnosis not present

## 2015-10-01 DIAGNOSIS — I959 Hypotension, unspecified: Secondary | ICD-10-CM | POA: Diagnosis not present

## 2015-10-01 DIAGNOSIS — Z79899 Other long term (current) drug therapy: Secondary | ICD-10-CM | POA: Diagnosis not present

## 2015-10-01 DIAGNOSIS — T502X5A Adverse effect of carbonic-anhydrase inhibitors, benzothiadiazides and other diuretics, initial encounter: Secondary | ICD-10-CM | POA: Diagnosis present

## 2015-10-01 DIAGNOSIS — I5021 Acute systolic (congestive) heart failure: Secondary | ICD-10-CM | POA: Diagnosis present

## 2015-10-01 DIAGNOSIS — F0390 Unspecified dementia without behavioral disturbance: Secondary | ICD-10-CM | POA: Diagnosis present

## 2015-10-01 DIAGNOSIS — Z7982 Long term (current) use of aspirin: Secondary | ICD-10-CM

## 2015-10-01 DIAGNOSIS — D509 Iron deficiency anemia, unspecified: Secondary | ICD-10-CM | POA: Diagnosis present

## 2015-10-01 DIAGNOSIS — Z95810 Presence of automatic (implantable) cardiac defibrillator: Secondary | ICD-10-CM | POA: Diagnosis not present

## 2015-10-01 DIAGNOSIS — J45909 Unspecified asthma, uncomplicated: Secondary | ICD-10-CM | POA: Diagnosis present

## 2015-10-01 DIAGNOSIS — I429 Cardiomyopathy, unspecified: Secondary | ICD-10-CM | POA: Diagnosis not present

## 2015-10-01 DIAGNOSIS — E876 Hypokalemia: Secondary | ICD-10-CM | POA: Diagnosis present

## 2015-10-01 DIAGNOSIS — N183 Chronic kidney disease, stage 3 unspecified: Secondary | ICD-10-CM | POA: Diagnosis present

## 2015-10-01 DIAGNOSIS — I130 Hypertensive heart and chronic kidney disease with heart failure and stage 1 through stage 4 chronic kidney disease, or unspecified chronic kidney disease: Secondary | ICD-10-CM | POA: Diagnosis present

## 2015-10-01 DIAGNOSIS — R7989 Other specified abnormal findings of blood chemistry: Secondary | ICD-10-CM

## 2015-10-01 DIAGNOSIS — E119 Type 2 diabetes mellitus without complications: Secondary | ICD-10-CM | POA: Diagnosis present

## 2015-10-01 DIAGNOSIS — I248 Other forms of acute ischemic heart disease: Secondary | ICD-10-CM | POA: Diagnosis present

## 2015-10-01 DIAGNOSIS — I428 Other cardiomyopathies: Secondary | ICD-10-CM

## 2015-10-01 DIAGNOSIS — J441 Chronic obstructive pulmonary disease with (acute) exacerbation: Secondary | ICD-10-CM | POA: Diagnosis present

## 2015-10-01 DIAGNOSIS — I5041 Acute combined systolic (congestive) and diastolic (congestive) heart failure: Secondary | ICD-10-CM | POA: Diagnosis not present

## 2015-10-01 DIAGNOSIS — E039 Hypothyroidism, unspecified: Secondary | ICD-10-CM | POA: Diagnosis present

## 2015-10-01 DIAGNOSIS — J449 Chronic obstructive pulmonary disease, unspecified: Secondary | ICD-10-CM | POA: Diagnosis present

## 2015-10-01 LAB — URINALYSIS, ROUTINE W REFLEX MICROSCOPIC
Glucose, UA: NEGATIVE mg/dL
KETONES UR: NEGATIVE mg/dL
LEUKOCYTES UA: NEGATIVE
NITRITE: NEGATIVE
Specific Gravity, Urine: 1.022 (ref 1.005–1.030)
pH: 6 (ref 5.0–8.0)

## 2015-10-01 LAB — COMPREHENSIVE METABOLIC PANEL
ALBUMIN: 3 g/dL — AB (ref 3.5–5.0)
ALT: 13 U/L — AB (ref 14–54)
AST: 16 U/L (ref 15–41)
Alkaline Phosphatase: 59 U/L (ref 38–126)
Anion gap: 10 (ref 5–15)
BUN: 9 mg/dL (ref 6–20)
CALCIUM: 9.4 mg/dL (ref 8.9–10.3)
CHLORIDE: 103 mmol/L (ref 101–111)
CO2: 29 mmol/L (ref 22–32)
Creatinine, Ser: 0.99 mg/dL (ref 0.44–1.00)
GFR calc non Af Amer: 53 mL/min — ABNORMAL LOW (ref >60)
Glucose, Bld: 102 mg/dL — ABNORMAL HIGH (ref 65–99)
Potassium: 3.1 mmol/L — ABNORMAL LOW (ref 3.5–5.1)
SODIUM: 142 mmol/L (ref 135–145)
TOTAL PROTEIN: 7.3 g/dL (ref 6.5–8.1)
Total Bilirubin: 1 mg/dL (ref 0.3–1.2)

## 2015-10-01 LAB — CBC WITH DIFFERENTIAL/PLATELET
BASOS ABS: 0 10*3/uL (ref 0.0–0.1)
BASOS PCT: 0 %
EOS PCT: 0 %
Eosinophils Absolute: 0 10*3/uL (ref 0.0–0.7)
HEMATOCRIT: 36.2 % (ref 36.0–46.0)
Hemoglobin: 10.7 g/dL — ABNORMAL LOW (ref 12.0–15.0)
Lymphocytes Relative: 21 %
Lymphs Abs: 1.5 10*3/uL (ref 0.7–4.0)
MCH: 27.2 pg (ref 26.0–34.0)
MCHC: 29.6 g/dL — ABNORMAL LOW (ref 30.0–36.0)
MCV: 91.9 fL (ref 78.0–100.0)
MONO ABS: 1 10*3/uL (ref 0.1–1.0)
Monocytes Relative: 14 %
NEUTROS ABS: 4.7 10*3/uL (ref 1.7–7.7)
Neutrophils Relative %: 65 %
PLATELETS: 445 10*3/uL — AB (ref 150–400)
RBC: 3.94 MIL/uL (ref 3.87–5.11)
RDW: 18.4 % — AB (ref 11.5–15.5)
WBC: 7.2 10*3/uL (ref 4.0–10.5)

## 2015-10-01 LAB — CBC
HCT: 39.6 % (ref 36.0–46.0)
HEMOGLOBIN: 11.5 g/dL — AB (ref 12.0–15.0)
MCH: 26.7 pg (ref 26.0–34.0)
MCHC: 29 g/dL — ABNORMAL LOW (ref 30.0–36.0)
MCV: 92.1 fL (ref 78.0–100.0)
PLATELETS: 343 10*3/uL (ref 150–400)
RBC: 4.3 MIL/uL (ref 3.87–5.11)
RDW: 18.4 % — ABNORMAL HIGH (ref 11.5–15.5)
WBC: 9.2 10*3/uL (ref 4.0–10.5)

## 2015-10-01 LAB — URINE MICROSCOPIC-ADD ON

## 2015-10-01 LAB — LIPASE, BLOOD: Lipase: 21 U/L (ref 11–51)

## 2015-10-01 LAB — TROPONIN I
TROPONIN I: 0.09 ng/mL — AB (ref <0.031)
TROPONIN I: 0.17 ng/mL — AB (ref <0.031)
TROPONIN I: 0.16 ng/mL — AB (ref <0.031)

## 2015-10-01 LAB — BRAIN NATRIURETIC PEPTIDE
B Natriuretic Peptide: 2051.9 pg/mL — ABNORMAL HIGH (ref 0.0–100.0)
B Natriuretic Peptide: 3232.1 pg/mL — ABNORMAL HIGH (ref 0.0–100.0)

## 2015-10-01 LAB — CREATININE, SERUM
CREATININE: 0.99 mg/dL (ref 0.44–1.00)
GFR calc non Af Amer: 53 mL/min — ABNORMAL LOW (ref >60)

## 2015-10-01 LAB — MRSA PCR SCREENING: MRSA by PCR: NEGATIVE

## 2015-10-01 LAB — PROCALCITONIN: Procalcitonin: 0.46 ng/mL

## 2015-10-01 MED ORDER — ALBUTEROL SULFATE (2.5 MG/3ML) 0.083% IN NEBU
2.5000 mg | INHALATION_SOLUTION | Freq: Four times a day (QID) | RESPIRATORY_TRACT | Status: DC
  Administered 2015-10-01 – 2015-10-02 (×5): 2.5 mg via RESPIRATORY_TRACT
  Filled 2015-10-01 (×4): qty 3

## 2015-10-01 MED ORDER — FUROSEMIDE 10 MG/ML IJ SOLN
40.0000 mg | Freq: Once | INTRAMUSCULAR | Status: AC
  Administered 2015-10-01: 40 mg via INTRAVENOUS
  Filled 2015-10-01: qty 4

## 2015-10-01 MED ORDER — VITAMIN D 1000 UNITS PO TABS
1000.0000 [IU] | ORAL_TABLET | Freq: Every day | ORAL | Status: DC
  Administered 2015-10-02 – 2015-10-05 (×4): 1000 [IU] via ORAL
  Filled 2015-10-01 (×4): qty 1

## 2015-10-01 MED ORDER — FERROUS SULFATE 325 (65 FE) MG PO TABS
325.0000 mg | ORAL_TABLET | ORAL | Status: DC
Start: 2015-10-02 — End: 2015-10-04
  Administered 2015-10-02 – 2015-10-04 (×3): 325 mg via ORAL
  Filled 2015-10-01 (×3): qty 1

## 2015-10-01 MED ORDER — HEPARIN SODIUM (PORCINE) 5000 UNIT/ML IJ SOLN
5000.0000 [IU] | Freq: Three times a day (TID) | INTRAMUSCULAR | Status: DC
Start: 2015-10-01 — End: 2015-10-05
  Administered 2015-10-01 – 2015-10-05 (×13): 5000 [IU] via SUBCUTANEOUS
  Filled 2015-10-01 (×13): qty 1

## 2015-10-01 MED ORDER — ACETAMINOPHEN 325 MG PO TABS
650.0000 mg | ORAL_TABLET | ORAL | Status: DC | PRN
  Administered 2015-10-01 – 2015-10-05 (×3): 650 mg via ORAL
  Filled 2015-10-01 (×3): qty 2

## 2015-10-01 MED ORDER — LISINOPRIL 20 MG PO TABS
20.0000 mg | ORAL_TABLET | Freq: Every day | ORAL | Status: DC
  Administered 2015-10-02: 20 mg via ORAL
  Filled 2015-10-01: qty 1

## 2015-10-01 MED ORDER — SODIUM CHLORIDE 0.9 % IV SOLN: 250.0000 mL | INTRAVENOUS | Status: DC | PRN

## 2015-10-01 MED ORDER — SODIUM CHLORIDE 0.9 % IJ SOLN
3.0000 mL | Freq: Two times a day (BID) | INTRAMUSCULAR | Status: DC
  Administered 2015-10-01 – 2015-10-05 (×7): 3 mL via INTRAVENOUS

## 2015-10-01 MED ORDER — POLYVINYL ALCOHOL 1.4 % OP SOLN
1.0000 [drp] | Freq: Two times a day (BID) | OPHTHALMIC | Status: DC
  Administered 2015-10-01 – 2015-10-05 (×8): 1 [drp] via OPHTHALMIC
  Filled 2015-10-01: qty 15

## 2015-10-01 MED ORDER — POTASSIUM CHLORIDE CRYS ER 20 MEQ PO TBCR
20.0000 meq | EXTENDED_RELEASE_TABLET | Freq: Every day | ORAL | Status: DC
  Administered 2015-10-02 – 2015-10-03 (×2): 20 meq via ORAL
  Filled 2015-10-01 (×2): qty 1

## 2015-10-01 MED ORDER — NITROGLYCERIN 2 % TD OINT
1.0000 [in_us] | TOPICAL_OINTMENT | Freq: Once | TRANSDERMAL | Status: AC
  Administered 2015-10-01: 1 [in_us] via TOPICAL
  Filled 2015-10-01: qty 1

## 2015-10-01 MED ORDER — POTASSIUM CHLORIDE CRYS ER 20 MEQ PO TBCR
40.0000 meq | EXTENDED_RELEASE_TABLET | Freq: Two times a day (BID) | ORAL | Status: AC
  Administered 2015-10-01: 40 meq via ORAL
  Filled 2015-10-01: qty 2

## 2015-10-01 MED ORDER — CARVEDILOL 6.25 MG PO TABS: 6.2500 mg | ORAL_TABLET | Freq: Two times a day (BID) | ORAL | Status: DC

## 2015-10-01 MED ORDER — FUROSEMIDE 10 MG/ML IJ SOLN
40.0000 mg | Freq: Two times a day (BID) | INTRAMUSCULAR | Status: DC
  Administered 2015-10-01 – 2015-10-03 (×4): 40 mg via INTRAVENOUS
  Filled 2015-10-01 (×4): qty 4

## 2015-10-01 MED ORDER — LEVOTHYROXINE SODIUM 50 MCG PO TABS
50.0000 ug | ORAL_TABLET | ORAL | Status: DC
  Administered 2015-10-02 – 2015-10-04 (×3): 50 ug via ORAL
  Filled 2015-10-01 (×3): qty 1

## 2015-10-01 MED ORDER — SODIUM CHLORIDE 0.9 % IJ SOLN: 3.0000 mL | INTRAMUSCULAR | Status: DC | PRN

## 2015-10-01 MED ORDER — POLYETHYL GLYCOL-PROPYL GLYCOL 0.4-0.3 % OP GEL
Freq: Two times a day (BID) | OPHTHALMIC | Status: DC
  Filled 2015-10-01: qty 10

## 2015-10-01 MED ORDER — IPRATROPIUM-ALBUTEROL 0.5-2.5 (3) MG/3ML IN SOLN
3.0000 mL | Freq: Once | RESPIRATORY_TRACT | Status: AC
  Administered 2015-10-01: 3 mL via RESPIRATORY_TRACT
  Filled 2015-10-01: qty 3

## 2015-10-01 MED ORDER — ONDANSETRON HCL 4 MG/2ML IJ SOLN: 4.0000 mg | Freq: Four times a day (QID) | INTRAMUSCULAR | Status: DC | PRN

## 2015-10-01 MED ORDER — CARVEDILOL 12.5 MG PO TABS
12.5000 mg | ORAL_TABLET | Freq: Two times a day (BID) | ORAL | Status: DC
  Administered 2015-10-02 (×2): 12.5 mg via ORAL
  Filled 2015-10-01 (×2): qty 1

## 2015-10-01 MED ORDER — ISOSORBIDE MONONITRATE ER 30 MG PO TB24
30.0000 mg | ORAL_TABLET | Freq: Every day | ORAL | Status: DC
  Administered 2015-10-02: 30 mg via ORAL
  Filled 2015-10-01: qty 1

## 2015-10-01 MED ORDER — ENSURE ENLIVE PO LIQD
237.0000 mL | Freq: Two times a day (BID) | ORAL | Status: DC
  Administered 2015-10-02 – 2015-10-05 (×7): 237 mL via ORAL

## 2015-10-01 MED ORDER — ATORVASTATIN CALCIUM 20 MG PO TABS
20.0000 mg | ORAL_TABLET | Freq: Every day | ORAL | Status: DC
  Administered 2015-10-02 – 2015-10-05 (×4): 20 mg via ORAL
  Filled 2015-10-01 (×4): qty 1

## 2015-10-01 MED ORDER — FENTANYL CITRATE (PF) 100 MCG/2ML IJ SOLN
50.0000 ug | Freq: Once | INTRAMUSCULAR | Status: AC
  Administered 2015-10-01: 50 ug via INTRAVENOUS
  Filled 2015-10-01: qty 2

## 2015-10-01 MED ORDER — FAMOTIDINE 20 MG PO TABS
10.0000 mg | ORAL_TABLET | Freq: Every day | ORAL | Status: DC
  Administered 2015-10-02 – 2015-10-05 (×4): 10 mg via ORAL
  Filled 2015-10-01 (×4): qty 1

## 2015-10-01 MED ORDER — ASPIRIN EC 81 MG PO TBEC
81.0000 mg | DELAYED_RELEASE_TABLET | Freq: Every day | ORAL | Status: DC
  Administered 2015-10-02 – 2015-10-05 (×4): 81 mg via ORAL
  Filled 2015-10-01 (×4): qty 1

## 2015-10-01 MED ORDER — METHYLPREDNISOLONE SODIUM SUCC 125 MG IJ SOLR
125.0000 mg | Freq: Once | INTRAMUSCULAR | Status: AC
  Administered 2015-10-01: 125 mg via INTRAVENOUS
  Filled 2015-10-01: qty 2

## 2015-10-01 NOTE — Progress Notes (Signed)
[facility] History and Physical                                                                                    [name], is a 81 y.o. female  MRN: [id number]   DOB - 1934-05-06  Admit Date - 10/01/2015  Outpatient Primary MD for the patient is Reymundo Poll, MD  With History of -  Past Medical History  Diagnosis Date  . Hypertension   . Asthma   . CHF (congestive heart failure) ([place])   . COPD (chronic obstructive pulmonary disease) ([place])   . Colon cancer ([place])   . Hyperlipidemia   . Heart murmur   . Anginal pain ([place])   . Pulmonary embolism ([place])   . Pneumonia     "just once" (03/01/2015)  . Hypothyroidism   . History of blood transfusion     "related to anemia, this is my 1st" (03/01/2015)  . Arthritis     "comes and goes" (03/01/2015)  . Renal disorder   . Colostomy care ([place])   . Diabetes mellitus without complication ([place])     pt denies this hx on 03/01/2015      Past Surgical History  Procedure Laterality Date  . Cholecystectomy    . Colon surgery      cancer  . Fracture surgery    . Dilation and curettage of uterus    . Tubal ligation    . Cataract extraction w/ intraocular lens  implant, bilateral Bilateral   . Ankle fracture surgery Left   . Abdominal hysterectomy    . Cardiac catheterization N/A 03/22/2015    Procedure: Right/Left Heart Cath and Coronary Angiography;  Surgeon: Leonie Man, MD;  Location: [place] [facility];  Service: Cardiovascular;  Laterality: N/A;    in for   Chief Complaint  Patient presents with  . Flank Pain     HPI  [name]  is a 81 y.o. female with pmh NICM, COPD, dementia, hyperlipidemia, s/p ICD placement 2005 in [place],  colon cancer s/p resection, hypothryroidism presented from [facility] with complaints of right-sided flank pain and shortness of breath. Unable to obtain any history from the pt given hx of dementia. Of note, pt was admitted 10/14 through 07/29/15 for sepsis of unknown source. She was seen  by cardiology as outpt on 08/11/15 at which time her lasix dose was increased.       According to ED MD, pt's flank pain has subsided since arrival to ED but main issue has been shortness of breath. She has required bipap since arrival to ED. Upon my evaluation, pt remains on bipap and breathing comfortably. She denies any pain, specifically no abdominal pain and states she is feeling better.    Review of Systems   In addition to the HPI above,  No Fever-chills, No Headache, No changes with Vision or hearing, No problems swallowing food or Liquids, No Chest pain, Cough or Shortness of Breath, No Abdominal pain, No Nausea or Vomiting, Bowel movements are regular, No Blood in stool or Urine, No dysuria, No new skin rashes or bruises, No new joints pains-aches,  No new weakness, tingling,  numbness in any extremity, No recent weight gain or loss, A full 10 point Review of Systems was done, except as stated above, all other Review of Systems were negative.  Social History Social History  Substance Use Topics  . Smoking status: Never Smoker   . Smokeless tobacco: Never Used  . Alcohol Use: No  L[facility] at [facility] [place]. Apparently ambulatory with walker when at baseline.   Family History Family History  Problem Relation Age of Onset  . CAD    . Hypertension    . Stroke    . Colon cancer Neg Hx   . GI Bleed Neg Hx     Prior to Admission medications   Medication Sig Start Date End Date Taking? Authorizing Provider  ADVAIR DISKUS 500-50 MCG/DOSE AEPB Inhale 1 puff into the lungs every 12 (twelve) hours. 12/28/14  Yes Historical Provider, MD  aspirin EC 81 MG tablet Take 1 tablet (81 mg total) by mouth every morning. 03/03/15  Yes Ripudeep K Rai, MD  atorvastatin (LIPITOR) 20 MG tablet Take 20 mg by mouth daily.    Yes Historical Provider, MD  carvedilol (COREG) 6.25 MG tablet Take 1 tablet (6.25 mg total) by mouth 2 (two) times daily with a meal. 03/04/15  Yes Geradine Girt, DO  feeding  supplement, ENSURE ENLIVE, (ENSURE ENLIVE) LIQD Take 237 mLs by mouth 2 (two) times daily between meals. 03/02/15  Yes Ripudeep Krystal Eaton, MD  ferrous sulfate 325 (65 FE) MG tablet Take 325 mg by mouth every morning.   Yes Historical Provider, MD  furosemide (LASIX) 40 MG tablet Take 1-1.5 tablets (40-60 mg total) by mouth as directed. Patient taking differently: Take 40-60 mg by mouth as directed. Take 1 tablet on Tuesday, Thursday, & Saturday Take 1.5 tablets on Monday, Wednesday, & Friday 08/11/15  Yes Erlene Quan, PA-C  isosorbide mononitrate (IMDUR) 30 MG 24 hr tablet Take 1 tablet (30 mg total) by mouth daily. 03/24/15  Yes Nishant Dhungel, MD  lactobacilus acidophilus & bulgar (FLORANEX) TABS chewable tablet Take 1 tablet by mouth 3 (three) times daily with meals. 07/29/15  Yes Florencia Reasons, MD  levothyroxine (SYNTHROID, LEVOTHROID) 50 MCG tablet Take 50 mcg by mouth every morning.  01/16/15  Yes Historical Provider, MD  lisinopril (PRINIVIL,ZESTRIL) 20 MG tablet Take 20 mg by mouth daily.   Yes Historical Provider, MD  oxymetazoline (AFRIN) 0.05 % nasal spray Place 1 spray into both nostrils 2 (two) times daily as needed for congestion.   Yes Historical Provider, MD  Polyethyl Glycol-Propyl Glycol (SYSTANE OP) Place 1 drop into the left eye 2 (two) times daily.   Yes Historical Provider, MD  ranitidine (ZANTAC) 150 MG tablet Take 150 mg by mouth daily.  01/14/15  Yes Historical Provider, MD  acetaminophen (TYLENOL) 325 MG tablet Take 650 mg by mouth every 6 (six) hours as needed (pain).    Historical Provider, MD  albuterol (PROVENTIL) (2.5 MG/3ML) 0.083% nebulizer solution Take 3 mLs (2.5 mg total) by nebulization every 4 (four) hours as needed for wheezing. 04/28/15   Geradine Girt, DO  cholecalciferol (VITAMIN D) 1000 UNITS tablet Take 1,000 Units by mouth daily.    Historical Provider, MD  diclofenac sodium (VOLTAREN) 1 % GEL Apply 4 g topically 3 (three) times daily.    Historical Provider, MD   HYDROcodone-acetaminophen (NORCO/VICODIN) 5-325 MG tablet Take 1-2 tablets by mouth every 4 (four) hours as needed for moderate pain. 07/25/15   Theodis Blaze, MD  lisinopril (PRINIVIL,ZESTRIL) 5 MG tablet Take 1 tablet (5 mg total) by mouth daily. 03/24/15   Nishant Dhungel, MD    No Known Allergies  Physical Exam  Vitals  Blood pressure 178/78, pulse 103, temperature 99.5 F (37.5 C), temperature source Oral, resp. rate 33, SpO2 99 %.   General: elderly female sitting upright in bed on bipap, but appears comfortable.   Psych:  Normal affect and insight, Not Suicidal or Homicidal, Awake and oriented to name.  Neuro:   No F.N deficits, ALL C.Nerves Intact, Strength 5/5 all 4 extremities, Sensation intact all 4 extremities.  ENT:  On bipap  Neck:  Supple, No lymphadenopathy appreciated  Respiratory:  Symmetrical chest wall movement, diffuse crackles throughout, no wheezing  Cardiac:  RRR, No Murmurs, mild LE edema at ankles   Abdomen:  Positive bowel sounds, Soft, Non tender, Non distended,  No masses appreciated. Left-sided ostomy  Skin:  No Cyanosis, Normal Skin Turgor, No Skin Rash or Bruise.  Extremities:  Able to move all 4. 5/5 strength in each,  no effusions.  Data Review  CBC  Recent Labs Lab 10/01/15 1021  WBC 7.2  HGB 10.7*  HCT 36.2  PLT 445*  MCV 91.9  MCH 27.2  MCHC 29.6*  RDW 18.4*  LYMPHSABS 1.5  MONOABS 1.0  EOSABS 0.0  BASOSABS 0.0    Chemistries   Recent Labs Lab 10/01/15 1021  NA 142  K 3.1*  CL 103  CO2 29  GLUCOSE 102*  BUN 9  CREATININE 0.99  CALCIUM 9.4  AST 16  ALT 13*  ALKPHOS 59  BILITOT 1.0    CrCl cannot be calculated (Unknown ideal weight.).  No results for input(s): TSH, T4TOTAL, T3FREE, THYROIDAB in the last 72 hours.  Invalid input(s): FREET3  Coagulation profile No results for input(s): INR, PROTIME in the last 168 hours.  No results for input(s): DDIMER in the last 72 hours.  Cardiac  Enzymes  Recent Labs Lab 10/01/15 1154  TROPONINI 0.09*    Invalid input(s): POCBNP  Urinalysis    Component Value Date/Time   COLORURINE AMBER* 10/01/2015 1130   APPEARANCEUR CLEAR 10/01/2015 1130   LABSPEC 1.022 10/01/2015 1130   PHURINE 6.0 10/01/2015 1130   GLUCOSEU NEGATIVE 10/01/2015 1130   HGBUR TRACE* 10/01/2015 1130   BILIRUBINUR SMALL* 10/01/2015 1130   KETONESUR NEGATIVE 10/01/2015 1130   PROTEINUR >300* 10/01/2015 1130   UROBILINOGEN 0.2 07/23/2015 1610   NITRITE NEGATIVE 10/01/2015 1130   LEUKOCYTESUR NEGATIVE 10/01/2015 1130    Imaging results:   Dg Chest Portable 1 View  10/01/2015  CLINICAL DATA:  81 year old female with shortness of breath cough and congestion. Initial encounter. EXAM: PORTABLE CHEST 1 VIEW COMPARISON:  08/23/2015 and earlier. FINDINGS: Portable AP semi upright view at 1232 hours. Stable cardiomegaly and mediastinal contours. Left chest cardiac AICD appears stable. Lower lung volumes. Increased interstitial markings throughout both lungs. Obscuration now the left hemidiaphragm. No pneumothorax. No definite air bronchograms. IMPRESSION: Interval worsening bilateral ventilation with appearance most suggestive of pulmonary edema plus small left pleural effusion and left lung base atelectasis. Acute bilateral pneumonia felt less likely. Chronic cardiomegaly. Electronically Signed   By: Genevie Ann M.D.   On: 10/01/2015 12:46    My personal review of EKG: NSR, No ST changes noted.   Assessment & Plan  Principal Problem:   Acute CHF (congestive heart failure) (HCC) Active Problems:   COPD (chronic obstructive pulmonary disease) (HCC)   Dementia   CKD (chronic kidney disease)  stage 3, GFR 30-59 ml/min   Benign essential HTN  Acute respiratory failure secondary to decompensated CHF (NICM) -admit SDU for IV diuresis -can likely try off bipap later today -last echo 6/16 EF 35%. Will defer repeating to cardiology -strict i/o -no empiric  antibiotics at this point as pt afebrile with normal WBC. No evidence of pna on cxr  Elevated troponin I, mild -suspect related to above with no acute changes on ekg, no c/o chest pain  -on asa, imdur -cardiac cath done 03/22/15 showed normal coronaries -will ask cardiology to consult  Hypokalemia, mild -secondary to diuretics -replete po  Right flank pain, resolved -monitor need for further w/u. abd exam benign.  -u/a unremarkable   COPD -stable.  CKD, stage III -stable. monitor -baseline cr 1.27  Chronic iron-def anemia -stable. Monitor.   Hypertension, essential  -continue home ACEI and BB  Hypothyroidism -TSH wnl 07/2015 -continue synthroid  Dyslipidemia -continue statin    DVT Prophylaxis: SQ Heparin   AM Labs Ordered, also please review Full Orders  Family Communication:  None available.  Code Status:  Full   Condition:  guarded  Time spent in minutes : [place], NP on 10/01/2015 at 2:03 PM Between 7am to 7pm - Pager - [phone] After 7pm go to [email or web address] - password [email or web address]  And look for the night coverage person covering me after hours  [facility]

## 2015-10-01 NOTE — Progress Notes (Signed)
Pt removed from bi pap and placed on 6 liters nasal cannula. Consuelo Pandy RN

## 2015-10-01 NOTE — Consult Note (Signed)
Admit date: 10/01/2015 Referring Physician  Dr. Tyrell Antonio Primary Physician  Dr. Reymundo Poll Primary Cardiologist  Dr. Sallyanne Kuster Reason for Consultation  CHF  HPI: [name] a 81 y.o. 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 Unable to obtain any history from the pt given hx of dementia. Of note, pt was admitted 10/14 through 07/29/15 for sepsis of unknown source. She was seen by cardiology as outpt on 08/11/15 at which time her lasix dose was increased.   According to ED MD, pt's flank pain had subsided on arrival to ED but main issue has been shortness of breath. She has required bipap on arrival to ED.  Chest xray showed acute CHF and BNP elevated at 2051 (similar to November admission).  Troponin mildly elevated at 0.09.  Cardiology is now asked to consult. She has a history of NIDCM with EF 35-40% on echo 03/2015.       PMH:   Past Medical History  Diagnosis Date  . Hypertension   . Asthma   . CHF (congestive heart failure) ([place])   . COPD (chronic obstructive pulmonary disease) ([place])   . Colon cancer ([place])   . Hyperlipidemia   . Heart murmur   . Anginal pain ([place])   . Pulmonary embolism ([place])   . Pneumonia     "just once" (03/01/2015)  . Hypothyroidism   . History of blood transfusion     "related to anemia, this is my 1st" (03/01/2015)  . Arthritis     "comes and goes" (03/01/2015)  . Renal disorder   . Colostomy care ([place])   . Diabetes mellitus without complication ([place])     pt denies this hx on 03/01/2015     PSH:   Past Surgical History  Procedure Laterality Date  . Cholecystectomy    . Colon surgery      cancer  . Fracture surgery    . Dilation and curettage of uterus      . Tubal ligation    . Cataract extraction w/ intraocular lens  implant, bilateral Bilateral   . Ankle fracture surgery Left   . Abdominal hysterectomy    . Cardiac catheterization N/A 03/22/2015    Procedure: Right/Left Heart Cath and Coronary Angiography;  Surgeon: Leonie Man, MD;  Location: [place] [facility];  Service: Cardiovascular;  Laterality: N/A;    Allergies:  Review of patient's allergies indicates no known allergies. Prior to Admit Meds:   Prescriptions prior to admission  Medication Sig Dispense Refill Last Dose  . ADVAIR DISKUS 500-50 MCG/DOSE AEPB Inhale 1 puff into the lungs every 12 (twelve) hours.  3 10/01/2015 at Unknown time  . aspirin EC 81 MG tablet Take 1 tablet (81 mg total) by mouth every morning.   10/01/2015 at Unknown time  . atorvastatin (LIPITOR) 20 MG tablet Take 20 mg by mouth daily.    10/01/2015 at Unknown time  . carvedilol (COREG) 6.25 MG tablet Take 1 tablet (6.25 mg total) by mouth 2 (two) times daily with a meal.   10/01/2015 at 0800  . feeding supplement, ENSURE ENLIVE, (ENSURE ENLIVE) LIQD Take 237 mLs by mouth 2 (two) times daily between meals. 237 mL 12 10/01/2015 at Unknown time  . ferrous sulfate 325 (  65 FE) MG tablet Take 325 mg by mouth every morning.   10/01/2015 at Unknown time  . furosemide (LASIX) 40 MG tablet Take 1-1.5 tablets (40-60 mg total) by mouth as directed. (Patient taking differently: Take 40-60 mg by mouth as directed. Take 1 tablet on Tuesday, Thursday, & Saturday Take 1.5 tablets on Monday, Wednesday, & Friday) 30 tablet 5 09/29/2015 at Unknown time  . isosorbide mononitrate (IMDUR) 30 MG 24 hr tablet Take 1 tablet (30 mg total) by mouth daily. 30 tablet 0 10/01/2015 at Unknown time  . lactobacilus acidophilus & bulgar (FLORANEX) TABS chewable tablet Take 1 tablet by mouth 3 (three) times daily with meals. 30 tablet 0 10/01/2015 at Unknown time  . levothyroxine (SYNTHROID, LEVOTHROID) 50 MCG tablet Take 50 mcg by mouth every  morning.   2 10/01/2015 at Unknown time  . lisinopril (PRINIVIL,ZESTRIL) 20 MG tablet Take 20 mg by mouth daily.   09/30/2015 at Unknown time  . oxymetazoline (AFRIN) 0.05 % nasal spray Place 1 spray into both nostrils 2 (two) times daily as needed for congestion.   unknown at unknown  . Polyethyl Glycol-Propyl Glycol (SYSTANE OP) Place 1 drop into the left eye 2 (two) times daily.   10/01/2015 at Unknown time  . ranitidine (ZANTAC) 150 MG tablet Take 150 mg by mouth daily.   11 10/01/2015 at Unknown time  . acetaminophen (TYLENOL) 325 MG tablet Take 650 mg by mouth every 6 (six) hours as needed (pain).   unknown at unknown  . albuterol (PROVENTIL) (2.5 MG/3ML) 0.083% nebulizer solution Take 3 mLs (2.5 mg total) by nebulization every 4 (four) hours as needed for wheezing. 75 mL 12 unknown  . cholecalciferol (VITAMIN D) 1000 UNITS tablet Take 1,000 Units by mouth daily.   08/22/2015 at Unknown time  . diclofenac sodium (VOLTAREN) 1 % GEL Apply 4 g topically 3 (three) times daily.   08/23/2015 at Unknown time  . HYDROcodone-acetaminophen (NORCO/VICODIN) 5-325 MG tablet Take 1-2 tablets by mouth every 4 (four) hours as needed for moderate pain. 30 tablet 0 unknown at unknown  . lisinopril (PRINIVIL,ZESTRIL) 5 MG tablet Take 1 tablet (5 mg total) by mouth daily. 30 tablet 0 08/23/2015 at Unknown time   Fam HX:    Family History  Problem Relation Age of Onset  . CAD    . Hypertension    . Stroke    . Colon cancer Neg Hx   . GI Bleed Neg Hx    Social HX:    Social History   Social History  . Marital Status: Divorced    Spouse Name: N/A  . Number of Children: N/A  . Years of Education: N/A   Occupational History  . Retired Psychologist, counselling    Social History Main Topics  . Smoking status: Never Smoker   . Smokeless tobacco: Never Used  . Alcohol Use: No  . Drug Use: No  . Sexual Activity: No   Other Topics Concern  . Not on file   Social History Narrative     ROS:  All 11 ROS  were addressed and are negative except what is stated in the HPI  Physical Exam: Blood pressure 152/66, pulse 89, temperature 99.2 F (37.3 C), temperature source Axillary, resp. rate 24, height 5\' 8"  (1.727 m), weight 165 lb 2 oz (74.9 kg), SpO2 91 %.    General: Well developed, well nourished, in no acute distress Head: Eyes PERRLA, No xanthomas.   Normal cephalic and atramatic  Lungs:  Clear bilaterally to auscultation and percussion. Heart:   HRRR S1 S2 Pulses are 2+ & equal.            No carotid bruit. No JVD.  No abdominal bruits. No femoral bruits. Abdomen: Bowel sounds are positive, abdomen soft and non-tender without masses  Extremities:   No clubbing, cyanosis or edema.  DP +1 Neuro: Alert and oriented X 3. Psych:  Good affect, responds appropriately    Labs:   Lab Results  Component Value Date   WBC 7.2 10/01/2015   HGB 10.7* 10/01/2015   HCT 36.2 10/01/2015   MCV 91.9 10/01/2015   PLT 445* 10/01/2015    Recent Labs Lab 10/01/15 1021  NA 142  K 3.1*  CL 103  CO2 29  BUN 9  CREATININE 0.99  CALCIUM 9.4  PROT 7.3  BILITOT 1.0  ALKPHOS 59  ALT 13*  AST 16  GLUCOSE 102*   No results found for: PTT Lab Results  Component Value Date   INR 1.27 04/25/2015   INR 1.30 03/21/2015   INR 1.19 03/02/2015   Lab Results  Component Value Date   TROPONINI 0.09* 10/01/2015     Lab Results  Component Value Date   CHOL 125 04/26/2015   CHOL  02/11/2009    139        ATP III CLASSIFICATION:  <200     mg/dL   Desirable  200-239  mg/dL   Borderline High  >=240    mg/dL   High          Lab Results  Component Value Date   HDL 37* 04/26/2015   HDL 32* 02/11/2009   Lab Results  Component Value Date   LDLCALC 68 04/26/2015   [place]  02/11/2009    91        Total Cholesterol/HDL:CHD Risk Coronary Heart Disease Risk Table                     Men   Women  1/2 Average Risk   3.4   3.3  Average Risk       5.0   4.4  2 X Average Risk   9.6   7.1  3 X  Average Risk  23.4   11.0        Use the calculated Patient Ratio above and the CHD Risk Table to determine the patient's CHD Risk.        ATP III CLASSIFICATION (LDL):  <100     mg/dL   Optimal  100-129  mg/dL   Near or Above                    Optimal  130-159  mg/dL   Borderline  160-189  mg/dL   High  >190     mg/dL   Very High   Lab Results  Component Value Date   TRIG 102 04/26/2015   TRIG 80 02/11/2009   Lab Results  Component Value Date   CHOLHDL 3.4 04/26/2015   CHOLHDL 4.3 02/11/2009   No results found for: LDLDIRECT    Radiology:  Dg Chest Portable 1 View  10/01/2015  CLINICAL DATA:  81 year old female with shortness of breath cough and congestion. Initial encounter. EXAM: PORTABLE CHEST 1 VIEW COMPARISON:  08/23/2015 and earlier. FINDINGS: Portable AP semi upright view at 1232 hours. Stable cardiomegaly and mediastinal contours. Left chest cardiac AICD appears stable. Lower lung volumes. Increased interstitial markings throughout both lungs.  Obscuration now the left hemidiaphragm. No pneumothorax. No definite air bronchograms. IMPRESSION: Interval worsening bilateral ventilation with appearance most suggestive of pulmonary edema plus small left pleural effusion and left lung base atelectasis. Acute bilateral pneumonia felt less likely. Chronic cardiomegaly. Electronically Signed   By: Genevie Ann M.D.   On: 10/01/2015 12:46    EKG:  Sinus tachycardia with frequent PAC's and non sustained atrial tachycardia with anterolateral infarct and RBBB.  Tele - NSR with V pacing  ASSESSMENT/PLAN: 1.  Acute systolic CHF - agree with IV diuretics.  Follow I&O's and daily weights.  Watch renal function closely while diuresing.  Continue BB and ACE I.   2.  Elevated troponin - minimally elevated  secondary to demand ischemia in setting of acute CHF exacerbation. Cardiac cath 03/2015 with normal coronary arteries. 3.  Nonischemic DCM with EF 35-40% by echo 03/2015 - continue BB/ACE I 4.   HTN -  Poorly controlled on current regimen.  Increase Coreg to 12.5mg  BID. Titrate ACE I as needed.   5.  Dyslipidemia - continue statin 6.  CKD stage III 7.  Hypokalemia - replete    Sueanne Margarita, MD  10/01/2015  4:31 PM

## 2015-10-01 NOTE — Progress Notes (Signed)
UR COMPLETED  

## 2015-10-01 NOTE — Progress Notes (Signed)
Pt has [employer] ICD. [place], the rep will be here in am and will check her device at that time.   Lenoard Aden 10/01/2015 5:16 PM Beeper [phone]

## 2015-10-01 NOTE — ED Provider Notes (Signed)
Pt appears comfortable Easily arousable and no distress noted   Ripley Fraise, MD 10/01/15 1423

## 2015-10-01 NOTE — Progress Notes (Signed)
Patient doing well off of Bipap.  Noted to be resting comfortably with no accessory muscle use.  SpO2 and RR stable on nasal canula.  RT will continue to monitor.

## 2015-10-01 NOTE — Progress Notes (Signed)
Placed patient on BiPAP per MD's order. 

## 2015-10-01 NOTE — ED Provider Notes (Signed)
CSN: [id number]     Arrival date & time 10/01/15  [id number] History   First MD Initiated Contact with Patient 10/01/15 [phone]     Chief Complaint  Patient presents with  . Flank Pain     Patient is a 81 y.o. female presenting with flank pain. The history is provided by the patient and a relative.  Flank Pain This is a new problem. Episode onset: earlier today. The problem occurs constantly. The problem has been gradually worsening. Associated symptoms include abdominal pain. Pertinent negatives include no chest pain and no shortness of breath. The symptoms are aggravated by walking (palpation and movement). The symptoms are relieved by rest. She has tried nothing for the symptoms.  patient presents with right sided abd/flank pain since this morning No fever/vomiting/diarrhea No recent falls No CP/SOB is reported She reports having this previously It is reported she has h/o UTI in past   Past Medical History  Diagnosis Date  . Hypertension   . Asthma   . CHF (congestive heart failure) ([place])   . COPD (chronic obstructive pulmonary disease) ([place])   . Colon cancer ([place])   . Hyperlipidemia   . Heart murmur   . Anginal pain ([place])   . Pulmonary embolism ([place])   . Pneumonia     "just once" (03/01/2015)  . Hypothyroidism   . History of blood transfusion     "related to anemia, this is my 1st" (03/01/2015)  . Arthritis     "comes and goes" (03/01/2015)  . Renal disorder   . Colostomy care ([place])   . Diabetes mellitus without complication ([place])     pt denies this hx on 03/01/2015   Past Surgical History  Procedure Laterality Date  . Cholecystectomy    . Colon surgery      cancer  . Fracture surgery    . Dilation and curettage of uterus    . Tubal ligation    . Cataract extraction w/ intraocular lens  implant, bilateral Bilateral   . Ankle fracture surgery Left   . Abdominal hysterectomy    . Cardiac catheterization N/A 03/22/2015    Procedure: Right/Left Heart Cath and Coronary  Angiography;  Surgeon: Leonie Man, MD;  Location: [place] [facility];  Service: Cardiovascular;  Laterality: N/A;   Family History  Problem Relation Age of Onset  . CAD    . Hypertension    . Stroke    . Colon cancer Neg Hx   . GI Bleed Neg Hx    Social History  Substance Use Topics  . Smoking status: Never Smoker   . Smokeless tobacco: Never Used  . Alcohol Use: No   OB History    No data available     Review of Systems  Respiratory: Negative for shortness of breath.   Cardiovascular: Negative for chest pain.  Gastrointestinal: Positive for abdominal pain. Negative for vomiting.  Genitourinary: Positive for flank pain.  All other systems reviewed and are negative.     Allergies  Review of patient's allergies indicates no known allergies.  Home Medications   Prior to Admission medications   Medication Sig Start Date End Date Taking? Authorizing Provider  acetaminophen (TYLENOL) 325 MG tablet Take 650 mg by mouth every 6 (six) hours as needed (pain).    Historical Provider, MD  ADVAIR DISKUS 500-50 MCG/DOSE AEPB Inhale 1 puff into the lungs every 12 (twelve) hours. 12/28/14   Historical Provider, MD  albuterol (PROVENTIL) (2.5 MG/3ML) 0.083% nebulizer solution Take  3 mLs (2.5 mg total) by nebulization every 4 (four) hours as needed for wheezing. 04/28/15   Geradine Girt, DO  aspirin EC 81 MG tablet Take 1 tablet (81 mg total) by mouth every morning. 03/03/15   Ripudeep Krystal Eaton, MD  atorvastatin (LIPITOR) 20 MG tablet Take 20 mg by mouth daily.     Historical Provider, MD  carvedilol (COREG) 6.25 MG tablet Take 1 tablet (6.25 mg total) by mouth 2 (two) times daily with a meal. 03/04/15   Geradine Girt, DO  cholecalciferol (VITAMIN D) 1000 UNITS tablet Take 1,000 Units by mouth daily.    Historical Provider, MD  diclofenac sodium (VOLTAREN) 1 % GEL Apply 4 g topically 3 (three) times daily.    Historical Provider, MD  feeding supplement, ENSURE ENLIVE, (ENSURE ENLIVE) LIQD  Take 237 mLs by mouth 2 (two) times daily between meals. 03/02/15   Ripudeep Krystal Eaton, MD  ferrous sulfate 325 (65 FE) MG tablet Take 325 mg by mouth every morning.    Historical Provider, MD  furosemide (LASIX) 40 MG tablet Take 1-1.5 tablets (40-60 mg total) by mouth as directed. 08/11/15   Erlene Quan, PA-C  HYDROcodone-acetaminophen (NORCO/VICODIN) 5-325 MG tablet Take 1-2 tablets by mouth every 4 (four) hours as needed for moderate pain. 07/25/15   Theodis Blaze, MD  isosorbide mononitrate (IMDUR) 30 MG 24 hr tablet Take 1 tablet (30 mg total) by mouth daily. 03/24/15   Nishant Dhungel, MD  lactobacilus acidophilus & bulgar (FLORANEX) TABS chewable tablet Take 1 tablet by mouth 3 (three) times daily with meals. 07/29/15   Florencia Reasons, MD  levothyroxine (SYNTHROID, LEVOTHROID) 50 MCG tablet Take 50 mcg by mouth every morning.  01/16/15   Historical Provider, MD  lisinopril (PRINIVIL,ZESTRIL) 5 MG tablet Take 1 tablet (5 mg total) by mouth daily. 03/24/15   Nishant Dhungel, MD  ranitidine (ZANTAC) 150 MG tablet Take 150 mg by mouth daily.  01/14/15   Historical Provider, MD   BP 160/95 mmHg  Pulse 89  Temp(Src) 99.4 F (37.4 C) (Oral)  Resp 20  SpO2 100% Physical Exam CONSTITUTIONAL: elderly, frail HEAD: Normocephalic/atraumatic EYES: EOMI ENMT: Mucous membranes moist NECK: supple no meningeal signs SPINE/BACK:entire spine nontender CV: S1/S2 noted, murmur noted LUNGS: Lungs are clear to auscultation bilaterally ABDOMEN: soft, moderate RLQ tenderness noted, no rebound or guarding, bowel sounds noted throughout abdomen, colostomy bag noted NEURO: Pt is awake/alert, moves all extremitiesx4.   EXTREMITIES: pulses normal/equal, full ROM, no deformities noted to lower extremities, mild tenderness to left foot (chronic per patient/family) SKIN: warm, color normal   ED Course  Procedures  CRITICAL CARE Performed by: Sharyon Cable Total critical care time: 33 minutes Critical care time was  exclusive of separately billable procedures and treating other patients. Critical care was necessary to treat or prevent imminent or life-threatening deterioration. Critical care was time spent personally by me on the following activities: development of treatment plan with patient and/or surrogate as well as nursing, discussions with consultants, evaluation of patient's response to treatment, examination of patient, obtaining history from patient or surrogate, ordering and performing treatments and interventions, ordering and review of laboratory studies, ordering and review of radiographic studies, pulse oximetry and re-evaluation of patient's condition. PATIENT WITH COPD/CHF, REQUIRING BIPAP AND CLOSE MONITORING AND STEPDOWN ADMISSION  11:03 AM Pt with abd pain EKG performed (pt poor historian) but no significant change from 04/25/15 EKG Per records, had cath in 2016 that showed minimal to no CAD 12:06  PM Pt now appears tachypneic She is wheezing (h/o COPD) Nebs ordered She also has continued abd pain Will get Ct imaging 12:27 PM Pt now more tachypneic/wheezing Will get CXR and place on bipap 1:01 PM Pt now reports abd is not painful and will cancel CT abd/pelvis No focal tenderness Her work of breathing improved Likely combination of COPD/CHF She is tolerating bipap Will admit Suspect elevated troponin due to tachycardia and I doubt ACS at this time 1:40 PM D/W TRI[facility] LAURIE [facility]OD WILL ADMIT TO STEPDOWN PT IS MUCH MORE COMFORTABLE APPEARING BP 178/78 mmHg  Pulse 103  Temp(Src) 99.5 F (37.5 C) (Oral)  Resp 33  SpO2 99% I HAVE SPOKEN TO SON [name] AT [phone] HE IS AWARE OF PLAN  Medications  fentaNYL (SUBLIMAZE) injection 50 mcg (50 mcg Intravenous Given 10/01/15 1037)  ipratropium-albuterol (DUONEB) 0.5-2.5 (3) MG/3ML nebulizer solution 3 mL (3 mLs Nebulization Given 10/01/15 1217)  methylPREDNISolone sodium succinate (SOLU-MEDROL) 125 mg/2 mL injection 125 mg  (125 mg Intravenous Given 10/01/15 1234)  nitroGLYCERIN (NITROGLYN) 2 % ointment 1 inch (1 inch Topical Given 10/01/15 1317)  furosemide (LASIX) injection 40 mg (40 mg Intravenous Given 10/01/15 1313)     Labs Review Labs Reviewed  COMPREHENSIVE METABOLIC PANEL - Abnormal; Notable for the following:    Potassium 3.1 (*)    Glucose, Bld 102 (*)    Albumin 3.0 (*)    ALT 13 (*)    GFR calc non Af Amer 53 (*)    All other components within normal limits  CBC WITH DIFFERENTIAL/PLATELET - Abnormal; Notable for the following:    Hemoglobin 10.7 (*)    MCHC 29.6 (*)    RDW 18.4 (*)    Platelets 445 (*)    All other components within normal limits  URINALYSIS, ROUTINE W REFLEX MICROSCOPIC (NOT AT [facility]) - Abnormal; Notable for the following:    Color, Urine AMBER (*)    Hgb urine dipstick TRACE (*)    Bilirubin Urine SMALL (*)    Protein, ur >300 (*)    All other components within normal limits  TROPONIN I - Abnormal; Notable for the following:    Troponin I 0.09 (*)    All other components within normal limits  URINE MICROSCOPIC-ADD ON - Abnormal; Notable for the following:    Squamous Epithelial / LPF 0-5 (*)    Bacteria, UA FEW (*)    All other components within normal limits  LIPASE, BLOOD  BRAIN NATRIURETIC PEPTIDE    Imaging Review Dg Chest Portable 1 View  10/01/2015  CLINICAL DATA:  81 year old female with shortness of breath cough and congestion. Initial encounter. EXAM: PORTABLE CHEST 1 VIEW COMPARISON:  08/23/2015 and earlier. FINDINGS: Portable AP semi upright view at 1232 hours. Stable cardiomegaly and mediastinal contours. Left chest cardiac AICD appears stable. Lower lung volumes. Increased interstitial markings throughout both lungs. Obscuration now the left hemidiaphragm. No pneumothorax. No definite air bronchograms. IMPRESSION: Interval worsening bilateral ventilation with appearance most suggestive of pulmonary edema plus small left pleural effusion and left lung  base atelectasis. Acute bilateral pneumonia felt less likely. Chronic cardiomegaly. Electronically Signed   By: Genevie Ann M.D.   On: 10/01/2015 12:46   I have personally reviewed and evaluated these images and lab results as part of my medical decision-making.   EKG Interpretation   Date/Time:  Friday October 01 2015 10:44:30 EST Ventricular Rate:  112 PR Interval:  144 QRS Duration: 176 QT Interval:  398 QTC Calculation: 543  R Axis:   139 Text Interpretation:  Ectopic atrial tachycardia, unifocal Nonspecific  intraventricular conduction delay ST depr, consider ischemia, inferior  leads No significant change since 04/25/15 Confirmed by Christy Gentles  MD,  [place] ([id number]) on 10/01/2015 11:03:55 AM      EKG Interpretation  Date/Time:  Friday October 01 2015 12:19:40 EST Ventricular Rate:  120 PR Interval:  144 QRS Duration: 175 QT Interval:  385 QTC Calculation: 544 R Axis:   136 Text Interpretation:  artifact suspect sinus tachycardia Right bundle branch block Anterolateral infarct, acute (LAD) Baseline wander in lead(s) V6 Abnormal ekg No significant change since last tracing Confirmed by Christy Gentles  MD, Elenore Rota ([id number]) on 10/01/2015 12:27:46 PM       MDM   Final diagnoses:  Acute respiratory failure, unspecified whether with hypoxia or hypercapnia (HCC)  Chronic obstructive pulmonary disease with acute exacerbation ([place])  Acute systolic congestive heart failure ([place])    Nursing notes including past medical history and social history reviewed and considered in documentation Labs/vital reviewed myself and considered during evaluation Previous records reviewed and considered     Ripley Fraise, MD 10/01/15 1342

## 2015-10-01 NOTE — ED Notes (Signed)
Per [facility] patient from [facility] for complaint of right-sided abdominal pain that started this morning when she woke.  Per [facility] facility states patient is normally ambulatory with assistive device but since this morning she says it hurts too much walk.  Facility states that the patient acts like this when she gets a UTI.  Patient has dementia, is currently alert, oriented to self, place, not to situation or time.

## 2015-10-02 DIAGNOSIS — N183 Chronic kidney disease, stage 3 (moderate): Secondary | ICD-10-CM

## 2015-10-02 DIAGNOSIS — I5041 Acute combined systolic (congestive) and diastolic (congestive) heart failure: Secondary | ICD-10-CM

## 2015-10-02 DIAGNOSIS — J9601 Acute respiratory failure with hypoxia: Secondary | ICD-10-CM

## 2015-10-02 LAB — CBC
HEMATOCRIT: 36.6 % (ref 36.0–46.0)
Hemoglobin: 11.1 g/dL — ABNORMAL LOW (ref 12.0–15.0)
MCH: 27.7 pg (ref 26.0–34.0)
MCHC: 30.3 g/dL (ref 30.0–36.0)
MCV: 91.3 fL (ref 78.0–100.0)
PLATELETS: 371 10*3/uL (ref 150–400)
RBC: 4.01 MIL/uL (ref 3.87–5.11)
RDW: 18.1 % — AB (ref 11.5–15.5)
WBC: 8.6 10*3/uL (ref 4.0–10.5)

## 2015-10-02 LAB — BASIC METABOLIC PANEL
Anion gap: 13 (ref 5–15)
BUN: 16 mg/dL (ref 6–20)
CHLORIDE: 101 mmol/L (ref 101–111)
CO2: 30 mmol/L (ref 22–32)
CREATININE: 1.11 mg/dL — AB (ref 0.44–1.00)
Calcium: 9.4 mg/dL (ref 8.9–10.3)
GFR calc Af Amer: 53 mL/min — ABNORMAL LOW (ref >60)
GFR calc non Af Amer: 46 mL/min — ABNORMAL LOW (ref >60)
GLUCOSE: 139 mg/dL — AB (ref 65–99)
POTASSIUM: 3.7 mmol/L (ref 3.5–5.1)
Sodium: 144 mmol/L (ref 135–145)

## 2015-10-02 LAB — TROPONIN I: TROPONIN I: 0.1 ng/mL — AB (ref <0.031)

## 2015-10-02 LAB — GLUCOSE, CAPILLARY: Glucose-Capillary: 112 mg/dL — ABNORMAL HIGH (ref 65–99)

## 2015-10-02 MED ORDER — ALBUTEROL SULFATE (2.5 MG/3ML) 0.083% IN NEBU
2.5000 mg | INHALATION_SOLUTION | Freq: Three times a day (TID) | RESPIRATORY_TRACT | Status: DC
  Administered 2015-10-02 – 2015-10-05 (×9): 2.5 mg via RESPIRATORY_TRACT
  Filled 2015-10-02 (×9): qty 3

## 2015-10-02 NOTE — Progress Notes (Signed)
Received pt to room 26 in no acute distress. Sat 100% on room air. Cal bell in reach

## 2015-10-02 NOTE — Progress Notes (Signed)
Report called to [facility] [address].

## 2015-10-02 NOTE — Progress Notes (Signed)
Subjective:  Feels better. Clinically improved. Diuresing   Objective:  Temp:  [97.7 F (36.5 C)-99.2 F (37.3 C)] 97.8 F (36.6 C) (12/24 1145) Pulse Rate:  [75-103] 84 (12/24 0459) Resp:  [16-33] 16 (12/24 0459) BP: (103-195)/(49-91) 103/49 mmHg (12/24 0459) SpO2:  [91 %-100 %] 100 % (12/24 0459) FiO2 (%):  [40 %-99 %] 99 % (12/24 0942) Weight:  [162 lb 7.7 oz (73.7 kg)-165 lb 2 oz (74.9 kg)] 162 lb 7.7 oz (73.7 kg) (12/24 0459) Weight change:   Intake/Output from previous day: 12/23 0701 - 12/24 0700 In: -  Out: 1550 [Urine:1550]  Intake/Output from this shift:    Physical Exam: General appearance: alert and no distress Neck: no adenopathy, no carotid bruit, no JVD, supple, symmetrical, trachea midline and thyroid not enlarged, symmetric, no tenderness/mass/nodules Lungs: clear to auscultation bilaterally Heart: regular rate and rhythm, S1, S2 normal, no murmur, click, rub or gallop Extremities: extremities normal, atraumatic, no cyanosis or edema  Lab Results: Results for orders placed or performed during the hospital encounter of 10/01/15 (from the past 48 hour(s))  Comprehensive metabolic panel     Status: Abnormal   Collection Time: 10/01/15 10:21 AM  Result Value Ref Range   Sodium 142 135 - 145 mmol/L   Potassium 3.1 (L) 3.5 - 5.1 mmol/L   Chloride 103 101 - 111 mmol/L   CO2 29 22 - 32 mmol/L   Glucose, Bld 102 (H) 65 - 99 mg/dL   BUN 9 6 - 20 mg/dL   Creatinine, Ser 0.99 0.44 - 1.00 mg/dL   Calcium 9.4 8.9 - 10.3 mg/dL   Total Protein 7.3 6.5 - 8.1 g/dL   Albumin 3.0 (L) 3.5 - 5.0 g/dL   AST 16 15 - 41 U/L   ALT 13 (L) 14 - 54 U/L   Alkaline Phosphatase 59 38 - 126 U/L   Total Bilirubin 1.0 0.3 - 1.2 mg/dL   GFR calc non Af Amer 53 (L) >60 mL/min   GFR calc Af Amer >60 >60 mL/min    Comment: (NOTE) The eGFR has been calculated using the CKD EPI equation. This calculation has not been validated in all clinical situations. eGFR's persistently  <60 mL/min signify possible Chronic Kidney Disease.    Anion gap 10 5 - 15  CBC with Differential/Platelet     Status: Abnormal   Collection Time: 10/01/15 10:21 AM  Result Value Ref Range   WBC 7.2 4.0 - 10.5 K/uL   RBC 3.94 3.87 - 5.11 MIL/uL   Hemoglobin 10.7 (L) 12.0 - 15.0 g/dL   HCT 36.2 36.0 - 46.0 %   MCV 91.9 78.0 - 100.0 fL   MCH 27.2 26.0 - 34.0 pg   MCHC 29.6 (L) 30.0 - 36.0 g/dL   RDW 18.4 (H) 11.5 - 15.5 %   Platelets 445 (H) 150 - 400 K/uL   Neutrophils Relative % 65 %   Neutro Abs 4.7 1.7 - 7.7 K/uL   Lymphocytes Relative 21 %   Lymphs Abs 1.5 0.7 - 4.0 K/uL   Monocytes Relative 14 %   Monocytes Absolute 1.0 0.1 - 1.0 K/uL   Eosinophils Relative 0 %   Eosinophils Absolute 0.0 0.0 - 0.7 K/uL   Basophils Relative 0 %   Basophils Absolute 0.0 0.0 - 0.1 K/uL  Lipase, blood     Status: None   Collection Time: 10/01/15 10:21 AM  Result Value Ref Range   Lipase 21 11 - 51 U/L  Brain natriuretic peptide     Status: Abnormal   Collection Time: 10/01/15 10:21 AM  Result Value Ref Range   B Natriuretic Peptide 2051.9 (H) 0.0 - 100.0 pg/mL  Urinalysis, Routine w reflex microscopic (not at [facility])     Status: Abnormal   Collection Time: 10/01/15 11:30 AM  Result Value Ref Range   Color, Urine AMBER (A) YELLOW    Comment: BIOCHEMICALS MAY BE AFFECTED BY COLOR   APPearance CLEAR CLEAR   Specific Gravity, Urine 1.022 1.005 - 1.030   pH 6.0 5.0 - 8.0   Glucose, UA NEGATIVE NEGATIVE mg/dL   Hgb urine dipstick TRACE (A) NEGATIVE   Bilirubin Urine SMALL (A) NEGATIVE   Ketones, ur NEGATIVE NEGATIVE mg/dL   Protein, ur >300 (A) NEGATIVE mg/dL   Nitrite NEGATIVE NEGATIVE   Leukocytes, UA NEGATIVE NEGATIVE  Urine microscopic-add on     Status: Abnormal   Collection Time: 10/01/15 11:30 AM  Result Value Ref Range   Squamous Epithelial / LPF 0-5 (A) NONE SEEN   WBC, UA 0-5 0 - 5 WBC/hpf   RBC / HPF 0-5 0 - 5 RBC/hpf   Bacteria, UA FEW (A) NONE SEEN  Troponin I     Status:  Abnormal   Collection Time: 10/01/15 11:54 AM  Result Value Ref Range   Troponin I 0.09 (H) <0.031 ng/mL    Comment:        PERSISTENTLY INCREASED TROPONIN VALUES IN THE RANGE OF 0.04-0.49 ng/mL CAN BE SEEN IN:       -UNSTABLE ANGINA       -CONGESTIVE HEART FAILURE       -MYOCARDITIS       -CHEST TRAUMA       -ARRYHTHMIAS       -LATE PRESENTING MYOCARDIAL INFARCTION       -COPD   CLINICAL FOLLOW-UP RECOMMENDED.   MRSA PCR Screening     Status: None   Collection Time: 10/01/15  3:27 PM  Result Value Ref Range   MRSA by PCR NEGATIVE NEGATIVE    Comment:        The GeneXpert MRSA Assay (FDA approved for NASAL specimens only), is one component of a comprehensive MRSA colonization surveillance program. It is not intended to diagnose MRSA infection nor to guide or monitor treatment for MRSA infections.   Brain natriuretic peptide     Status: Abnormal   Collection Time: 10/01/15  5:00 PM  Result Value Ref Range   B Natriuretic Peptide 3232.1 (H) 0.0 - 100.0 pg/mL  CBC     Status: Abnormal   Collection Time: 10/01/15  5:00 PM  Result Value Ref Range   WBC 9.2 4.0 - 10.5 K/uL   RBC 4.30 3.87 - 5.11 MIL/uL   Hemoglobin 11.5 (L) 12.0 - 15.0 g/dL   HCT 39.6 36.0 - 46.0 %   MCV 92.1 78.0 - 100.0 fL   MCH 26.7 26.0 - 34.0 pg   MCHC 29.0 (L) 30.0 - 36.0 g/dL   RDW 18.4 (H) 11.5 - 15.5 %   Platelets 343 150 - 400 K/uL  Creatinine, serum     Status: Abnormal   Collection Time: 10/01/15  5:00 PM  Result Value Ref Range   Creatinine, Ser 0.99 0.44 - 1.00 mg/dL   GFR calc non Af Amer 53 (L) >60 mL/min   GFR calc Af Amer >60 >60 mL/min    Comment: (NOTE) The eGFR has been calculated using the CKD EPI equation. This calculation has not  been validated in all clinical situations. eGFR's persistently <60 mL/min signify possible Chronic Kidney Disease.   Troponin I     Status: Abnormal   Collection Time: 10/01/15  5:00 PM  Result Value Ref Range   Troponin I 0.17 (H) <0.031 ng/mL     Comment:        PERSISTENTLY INCREASED TROPONIN VALUES IN THE RANGE OF 0.04-0.49 ng/mL CAN BE SEEN IN:       -UNSTABLE ANGINA       -CONGESTIVE HEART FAILURE       -MYOCARDITIS       -CHEST TRAUMA       -ARRYHTHMIAS       -LATE PRESENTING MYOCARDIAL INFARCTION       -COPD   CLINICAL FOLLOW-UP RECOMMENDED.   Procalcitonin - Baseline     Status: None   Collection Time: 10/01/15  5:00 PM  Result Value Ref Range   Procalcitonin 0.46 ng/mL    Comment:        Interpretation: PCT (Procalcitonin) <= 0.5 ng/mL: Systemic infection (sepsis) is not likely. Local bacterial infection is possible. (NOTE)         ICU PCT Algorithm               Non ICU PCT Algorithm    ----------------------------     ------------------------------         PCT < 0.25 ng/mL                 PCT < 0.1 ng/mL     Stopping of antibiotics            Stopping of antibiotics       strongly encouraged.               strongly encouraged.    ----------------------------     ------------------------------       PCT level decrease by               PCT < 0.25 ng/mL       >= 80% from peak PCT       OR PCT 0.25 - 0.5 ng/mL          Stopping of antibiotics                                             encouraged.     Stopping of antibiotics           encouraged.    ----------------------------     ------------------------------       PCT level decrease by              PCT >= 0.25 ng/mL       < 80% from peak PCT        AND PCT >= 0.5 ng/mL            Continuin g antibiotics                                              encouraged.       Continuing antibiotics            encouraged.    ----------------------------     ------------------------------     PCT level increase compared  PCT > 0.5 ng/mL         with peak PCT AND          PCT >= 0.5 ng/mL             Escalation of antibiotics                                          strongly encouraged.      Escalation of antibiotics        strongly encouraged.     Troponin I     Status: Abnormal   Collection Time: 10/01/15  9:45 PM  Result Value Ref Range   Troponin I 0.16 (H) <0.031 ng/mL    Comment:        PERSISTENTLY INCREASED TROPONIN VALUES IN THE RANGE OF 0.04-0.49 ng/mL CAN BE SEEN IN:       -UNSTABLE ANGINA       -CONGESTIVE HEART FAILURE       -MYOCARDITIS       -CHEST TRAUMA       -ARRYHTHMIAS       -LATE PRESENTING MYOCARDIAL INFARCTION       -COPD   CLINICAL FOLLOW-UP RECOMMENDED.   Glucose, capillary     Status: Abnormal   Collection Time: 10/02/15  2:06 AM  Result Value Ref Range   Glucose-Capillary 112 (H) 65 - 99 mg/dL   Comment 1 Notify RN   Troponin I     Status: Abnormal   Collection Time: 10/02/15  3:35 AM  Result Value Ref Range   Troponin I 0.10 (H) <0.031 ng/mL    Comment:        PERSISTENTLY INCREASED TROPONIN VALUES IN THE RANGE OF 0.04-0.49 ng/mL CAN BE SEEN IN:       -UNSTABLE ANGINA       -CONGESTIVE HEART FAILURE       -MYOCARDITIS       -CHEST TRAUMA       -ARRYHTHMIAS       -LATE PRESENTING MYOCARDIAL INFARCTION       -COPD   CLINICAL FOLLOW-UP RECOMMENDED.   Basic metabolic panel     Status: Abnormal   Collection Time: 10/02/15  3:35 AM  Result Value Ref Range   Sodium 144 135 - 145 mmol/L   Potassium 3.7 3.5 - 5.1 mmol/L   Chloride 101 101 - 111 mmol/L   CO2 30 22 - 32 mmol/L   Glucose, Bld 139 (H) 65 - 99 mg/dL   BUN 16 6 - 20 mg/dL   Creatinine, Ser 1.11 (H) 0.44 - 1.00 mg/dL   Calcium 9.4 8.9 - 10.3 mg/dL   GFR calc non Af Amer 46 (L) >60 mL/min   GFR calc Af Amer 53 (L) >60 mL/min    Comment: (NOTE) The eGFR has been calculated using the CKD EPI equation. This calculation has not been validated in all clinical situations. eGFR's persistently <60 mL/min signify possible Chronic Kidney Disease.    Anion gap 13 5 - 15  CBC     Status: Abnormal   Collection Time: 10/02/15  3:35 AM  Result Value Ref Range   WBC 8.6 4.0 - 10.5 K/uL   RBC 4.01 3.87 - 5.11 MIL/uL   Hemoglobin 11.1  (L) 12.0 - 15.0 g/dL   HCT 36.6 36.0 - 46.0 %   MCV 91.3 78.0 - 100.0 fL   MCH  27.7 26.0 - 34.0 pg   MCHC 30.3 30.0 - 36.0 g/dL   RDW 18.1 (H) 11.5 - 15.5 %   Platelets 371 150 - 400 K/uL    Imaging: Imaging results have been reviewed  Tele- NSR in 80-90s  Assessment/Plan:   1. Principal Problem: 2.   Acute CHF (congestive heart failure) ([place]) 3. Active Problems: 4.   COPD (chronic obstructive pulmonary disease) ([place]) 5.   Dementia 6.   CKD (chronic kidney disease) stage 3, GFR 30-59 ml/min 7.   Benign essential HTN 8.   Congestive heart failure, acute ([place]) 9.   Time Spent Directly with Patient:  Pt with NISCM admitted with volume overload and CHF. Pacer interrogation no Afib. 1.5 liter neg I/O on lasix 40 mg IV BID. Feeling better. BB titrated. Continue current Rx. Can prob be transitioned to PO lasix on Monday.   Length of Stay:  LOS: 1 day    Quay Burow 10/02/2015, 12:33 PM

## 2015-10-02 NOTE — Progress Notes (Signed)
Per interrogation, no afib. No atrial arrhythmia above 171bmp.  Hilbert Corrigan PA Pager: [phone]

## 2015-10-02 NOTE — Progress Notes (Signed)
Pt had x2 episodes of extreme diaphoresis with need to change gown and bedding. Pt denies history of diaphoretic episodes and  denies SOB. Patient following simple commands. Notified HO of assessment.

## 2015-10-02 NOTE — Progress Notes (Signed)
[facility] Progress Note   [name]  [id number]  DOB: 06/16/1934  DOA: 10/01/2015 PCP: Reymundo Poll, MD  Brief narrative: [name] is a 81 y.o. female from skilled nursing facility with nonischemic cardiomyopathy,  AICD/pacemaker, chronic systolic failure, CK D3, hypertension, colon cancer status post resection, hypothyroidism presents to the hospital for acute shortness of breath and is found to have pulmonary edema. She is also noted to have mildly elevated troponin but has not had any chest pain.   Subjective: No complaints of shortness of breath at rest. No chest pain or palpitations. No cough  Assessment/Plan: Principal Problem:   Acute systolic congestive heart failure -acute respiratory failure -2-D echo 6/16 shows an EF of 35-40% with hypokinesis of the inferior wall, anterior wall and septum -Continue IV Lasix, Coreg, Imdur or, lisinopril - diuresing well, transfer out of the step down unit -Cardiology assisting with management as well -Advised to limit by mouth fluid intake  Active Problems:   ICD/pacemaker in place -V paced    COPD (chronic obstructive pulmonary disease) -No wheezing noted continue albuterol inhaler every 6 hours    CKD (chronic kidney disease) stage 2- 3 -Creatinine ranges from 0.99 - 1.2    Benign essential HTN -We'll controlled  Code Status:     Code Status Orders        Start     Ordered   10/01/15 1527  Full code   Continuous     10/01/15 1526     Family Communication:  Disposition Plan: return to skilled nursing facility on Monday DVT prophylaxis: Heparin [facility] cardiology Procedures:  Antibiotics: Anti-infectives    None      Objective: Filed Weights   10/01/15 1524 10/02/15 0459  Weight: 74.9 kg (165 lb 2 oz) 73.7 kg (162 lb 7.7 oz)    Intake/Output Summary (Last 24 hours) at 10/02/15 1408 Last data filed at 10/02/15 0459  Gross per 24 hour  Intake      0 ml  Output   1550 ml  Net   -1550 ml     Vitals Filed Vitals:   10/02/15 0244 10/02/15 0459 10/02/15 0800 10/02/15 1145  BP:  103/49    Pulse:  84    Temp:  98 F (36.7 C) 97.7 F (36.5 C) 97.8 F (36.6 C)  TempSrc:  Oral Oral Oral  Resp:  16    Height:      Weight:  73.7 kg (162 lb 7.7 oz)    SpO2: 98% 100%      Exam:  General:  Pt is alert, not in acute distress  HEENT: No icterus, No thrush, oral mucosa moist  Cardiovascular: regular rate and rhythm, S1/S2,    Respiratory: crackles bilaterally at bases  Abdomen: Soft, +Bowel sounds, non tender, non distended, no guarding  MSK: No LE edema, cyanosis or clubbing  Data Reviewed: Basic Metabolic Panel:  Recent Labs Lab 10/01/15 1021 10/01/15 1700 10/02/15 0335  NA 142  --  144  K 3.1*  --  3.7  CL 103  --  101  CO2 29  --  30  GLUCOSE 102*  --  139*  BUN 9  --  16  CREATININE 0.99 0.99 1.11*  CALCIUM 9.4  --  9.4   Liver Function Tests:  Recent Labs Lab 10/01/15 1021  AST 16  ALT 13*  ALKPHOS 59  BILITOT 1.0  PROT 7.3  ALBUMIN 3.0*    Recent Labs Lab 10/01/15 1021  LIPASE  21   No results for input(s): AMMONIA in the last 168 hours. CBC:  Recent Labs Lab 10/01/15 1021 10/01/15 1700 10/02/15 0335  WBC 7.2 9.2 8.6  NEUTROABS 4.7  --   --   HGB 10.7* 11.5* 11.1*  HCT 36.2 39.6 36.6  MCV 91.9 92.1 91.3  PLT 445* 343 371   Cardiac Enzymes:  Recent Labs Lab 10/01/15 1154 10/01/15 1700 10/01/15 2145 10/02/15 0335  TROPONINI 0.09* 0.17* 0.16* 0.10*   BNP (last 3 results)  Recent Labs  08/23/15 1327 10/01/15 1021 10/01/15 1700  BNP 2063.6* 2051.9* 3232.1*    ProBNP (last 3 results) No results for input(s): PROBNP in the last 8760 hours.  CBG:  Recent Labs Lab 10/02/15 0206  GLUCAP 112*    Recent Results (from the past 240 hour(s))  MRSA PCR Screening     Status: None   Collection Time: 10/01/15  3:27 PM  Result Value Ref Range Status   MRSA by PCR NEGATIVE NEGATIVE Final    Comment:         The GeneXpert MRSA Assay (FDA approved for NASAL specimens only), is one component of a comprehensive MRSA colonization surveillance program. It is not intended to diagnose MRSA infection nor to guide or monitor treatment for MRSA infections.      Studies: Dg Chest Portable 1 View  10/01/2015  CLINICAL DATA:  81 year old female with shortness of breath cough and congestion. Initial encounter. EXAM: PORTABLE CHEST 1 VIEW COMPARISON:  08/23/2015 and earlier. FINDINGS: Portable AP semi upright view at 1232 hours. Stable cardiomegaly and mediastinal contours. Left chest cardiac AICD appears stable. Lower lung volumes. Increased interstitial markings throughout both lungs. Obscuration now the left hemidiaphragm. No pneumothorax. No definite air bronchograms. IMPRESSION: Interval worsening bilateral ventilation with appearance most suggestive of pulmonary edema plus small left pleural effusion and left lung base atelectasis. Acute bilateral pneumonia felt less likely. Chronic cardiomegaly. Electronically Signed   By: Genevie Ann M.D.   On: 10/01/2015 12:46    Scheduled Meds:  Scheduled Meds: . albuterol  2.5 mg Nebulization Q6H  . aspirin EC  81 mg Oral Daily  . atorvastatin  20 mg Oral Daily  . carvedilol  12.5 mg Oral BID WC  . cholecalciferol  1,000 Units Oral Daily  . famotidine  10 mg Oral Daily  . feeding supplement (ENSURE ENLIVE)  237 mL Oral BID BM  . ferrous sulfate  325 mg Oral BH-q7a  . furosemide  40 mg Intravenous Q12H  . heparin  5,000 Units Subcutaneous 3 times per day  . isosorbide mononitrate  30 mg Oral Daily  . levothyroxine  50 mcg Oral BH-q7a  . lisinopril  20 mg Oral Daily  . polyvinyl alcohol  1 drop Left Eye BID  . potassium chloride  20 mEq Oral Daily  . sodium chloride  3 mL Intravenous Q12H   Continuous Infusions:   Time spent on care of this patient: 35 min   [place], MD 10/02/2015, 2:08 PM  LOS: 1 day   [facility] Office   [phone] Pager - Text Page per [email or web address] If 7PM-7AM, please contact night-coverage [email or web address]

## 2015-10-03 DIAGNOSIS — F0390 Unspecified dementia without behavioral disturbance: Secondary | ICD-10-CM

## 2015-10-03 LAB — BASIC METABOLIC PANEL
Anion gap: 11 (ref 5–15)
BUN: 34 mg/dL — AB (ref 6–20)
CALCIUM: 9.1 mg/dL (ref 8.9–10.3)
CO2: 30 mmol/L (ref 22–32)
Chloride: 101 mmol/L (ref 101–111)
Creatinine, Ser: 1.24 mg/dL — ABNORMAL HIGH (ref 0.44–1.00)
GFR calc Af Amer: 47 mL/min — ABNORMAL LOW (ref >60)
GFR, EST NON AFRICAN AMERICAN: 40 mL/min — AB (ref >60)
GLUCOSE: 79 mg/dL (ref 65–99)
Potassium: 3.6 mmol/L (ref 3.5–5.1)
SODIUM: 142 mmol/L (ref 135–145)

## 2015-10-03 MED ORDER — CARVEDILOL 3.125 MG PO TABS
3.1250 mg | ORAL_TABLET | Freq: Two times a day (BID) | ORAL | Status: DC
  Filled 2015-10-03: qty 1

## 2015-10-03 MED ORDER — CARVEDILOL 6.25 MG PO TABS
6.2500 mg | ORAL_TABLET | Freq: Two times a day (BID) | ORAL | Status: DC
  Administered 2015-10-03 – 2015-10-05 (×5): 6.25 mg via ORAL
  Filled 2015-10-03 (×5): qty 1

## 2015-10-03 MED ORDER — LISINOPRIL 2.5 MG PO TABS
2.5000 mg | ORAL_TABLET | Freq: Every day | ORAL | Status: DC
  Administered 2015-10-03 – 2015-10-05 (×3): 2.5 mg via ORAL
  Filled 2015-10-03 (×3): qty 1

## 2015-10-03 NOTE — Progress Notes (Signed)
Subjective:  Denies dyspnea or chest pain.  Objective:  Temp:  [97.8 F (36.6 C)-98.2 F (36.8 C)] 97.8 F (36.6 C) (12/25 0536) Pulse Rate:  [87-88] 87 (12/25 0536) Resp:  [20] 20 (12/25 0536) BP: (100-115)/(42-58) 114/45 mmHg (12/25 0536) SpO2:  [94 %-100 %] 98 % (12/25 0838) Weight:  [164 lb 14.5 oz (74.8 kg)-166 lb 0.1 oz (75.3 kg)] 164 lb 14.5 oz (74.8 kg) (12/25 0536) Weight change: 14.1 oz (0.4 kg)  Intake/Output from previous day: 12/24 0701 - 12/25 0700 In: 600 [P.O.:600] Out: 875 [Urine:875]  Intake/Output from this shift: Total I/O In: 240 [P.O.:240] Out: 1050 [Urine:1050]  Physical Exam: General appearance: alert and no distress Neck: supple Lungs: Minimal basilar crackles Heart: RRR Abd: soft, not tender, colostomy Extremities: no edema Neuro: grossly intact  Lab Results: Results for orders placed or performed during the hospital encounter of 10/01/15 (from the past 48 hour(s))  Urinalysis, Routine w reflex microscopic (not at [facility])     Status: Abnormal   Collection Time: 10/01/15 11:30 AM  Result Value Ref Range   Color, Urine AMBER (A) YELLOW    Comment: BIOCHEMICALS MAY BE AFFECTED BY COLOR   APPearance CLEAR CLEAR   Specific Gravity, Urine 1.022 1.005 - 1.030   pH 6.0 5.0 - 8.0   Glucose, UA NEGATIVE NEGATIVE mg/dL   Hgb urine dipstick TRACE (A) NEGATIVE   Bilirubin Urine SMALL (A) NEGATIVE   Ketones, ur NEGATIVE NEGATIVE mg/dL   Protein, ur >300 (A) NEGATIVE mg/dL   Nitrite NEGATIVE NEGATIVE   Leukocytes, UA NEGATIVE NEGATIVE  Urine microscopic-add on     Status: Abnormal   Collection Time: 10/01/15 11:30 AM  Result Value Ref Range   Squamous Epithelial / LPF 0-5 (A) NONE SEEN   WBC, UA 0-5 0 - 5 WBC/hpf   RBC / HPF 0-5 0 - 5 RBC/hpf   Bacteria, UA FEW (A) NONE SEEN  Troponin I     Status: Abnormal   Collection Time: 10/01/15 11:54 AM  Result Value Ref Range   Troponin I 0.09 (H) <0.031 ng/mL    Comment:        PERSISTENTLY  INCREASED TROPONIN VALUES IN THE RANGE OF 0.04-0.49 ng/mL CAN BE SEEN IN:       -UNSTABLE ANGINA       -CONGESTIVE HEART FAILURE       -MYOCARDITIS       -CHEST TRAUMA       -ARRYHTHMIAS       -LATE PRESENTING MYOCARDIAL INFARCTION       -COPD   CLINICAL FOLLOW-UP RECOMMENDED.   MRSA PCR Screening     Status: None   Collection Time: 10/01/15  3:27 PM  Result Value Ref Range   MRSA by PCR NEGATIVE NEGATIVE    Comment:        The GeneXpert MRSA Assay (FDA approved for NASAL specimens only), is one component of a comprehensive MRSA colonization surveillance program. It is not intended to diagnose MRSA infection nor to guide or monitor treatment for MRSA infections.   Brain natriuretic peptide     Status: Abnormal   Collection Time: 10/01/15  5:00 PM  Result Value Ref Range   B Natriuretic Peptide 3232.1 (H) 0.0 - 100.0 pg/mL  CBC     Status: Abnormal   Collection Time: 10/01/15  5:00 PM  Result Value Ref Range   WBC 9.2 4.0 - 10.5 K/uL   RBC 4.30 3.87 - 5.11 MIL/uL  Hemoglobin 11.5 (L) 12.0 - 15.0 g/dL   HCT 39.6 36.0 - 46.0 %   MCV 92.1 78.0 - 100.0 fL   MCH 26.7 26.0 - 34.0 pg   MCHC 29.0 (L) 30.0 - 36.0 g/dL   RDW 18.4 (H) 11.5 - 15.5 %   Platelets 343 150 - 400 K/uL  Creatinine, serum     Status: Abnormal   Collection Time: 10/01/15  5:00 PM  Result Value Ref Range   Creatinine, Ser 0.99 0.44 - 1.00 mg/dL   GFR calc non Af Amer 53 (L) >60 mL/min   GFR calc Af Amer >60 >60 mL/min    Comment: (NOTE) The eGFR has been calculated using the CKD EPI equation. This calculation has not been validated in all clinical situations. eGFR's persistently <60 mL/min signify possible Chronic Kidney Disease.   Troponin I     Status: Abnormal   Collection Time: 10/01/15  5:00 PM  Result Value Ref Range   Troponin I 0.17 (H) <0.031 ng/mL    Comment:        PERSISTENTLY INCREASED TROPONIN VALUES IN THE RANGE OF 0.04-0.49 ng/mL CAN BE SEEN IN:       -UNSTABLE ANGINA        -CONGESTIVE HEART FAILURE       -MYOCARDITIS       -CHEST TRAUMA       -ARRYHTHMIAS       -LATE PRESENTING MYOCARDIAL INFARCTION       -COPD   CLINICAL FOLLOW-UP RECOMMENDED.   Procalcitonin - Baseline     Status: None   Collection Time: 10/01/15  5:00 PM  Result Value Ref Range   Procalcitonin 0.46 ng/mL    Comment:        Interpretation: PCT (Procalcitonin) <= 0.5 ng/mL: Systemic infection (sepsis) is not likely. Local bacterial infection is possible. (NOTE)         ICU PCT Algorithm               Non ICU PCT Algorithm    ----------------------------     ------------------------------         PCT < 0.25 ng/mL                 PCT < 0.1 ng/mL     Stopping of antibiotics            Stopping of antibiotics       strongly encouraged.               strongly encouraged.    ----------------------------     ------------------------------       PCT level decrease by               PCT < 0.25 ng/mL       >= 80% from peak PCT       OR PCT 0.25 - 0.5 ng/mL          Stopping of antibiotics                                             encouraged.     Stopping of antibiotics           encouraged.    ----------------------------     ------------------------------       PCT level decrease by  PCT >= 0.25 ng/mL       < 80% from peak PCT        AND PCT >= 0.5 ng/mL            Continuin g antibiotics                                              encouraged.       Continuing antibiotics            encouraged.    ----------------------------     ------------------------------     PCT level increase compared          PCT > 0.5 ng/mL         with peak PCT AND          PCT >= 0.5 ng/mL             Escalation of antibiotics                                          strongly encouraged.      Escalation of antibiotics        strongly encouraged.   Troponin I     Status: Abnormal   Collection Time: 10/01/15  9:45 PM  Result Value Ref Range   Troponin I 0.16 (H) <0.031 ng/mL    Comment:         PERSISTENTLY INCREASED TROPONIN VALUES IN THE RANGE OF 0.04-0.49 ng/mL CAN BE SEEN IN:       -UNSTABLE ANGINA       -CONGESTIVE HEART FAILURE       -MYOCARDITIS       -CHEST TRAUMA       -ARRYHTHMIAS       -LATE PRESENTING MYOCARDIAL INFARCTION       -COPD   CLINICAL FOLLOW-UP RECOMMENDED.   Glucose, capillary     Status: Abnormal   Collection Time: 10/02/15  2:06 AM  Result Value Ref Range   Glucose-Capillary 112 (H) 65 - 99 mg/dL   Comment 1 Notify RN   Troponin I     Status: Abnormal   Collection Time: 10/02/15  3:35 AM  Result Value Ref Range   Troponin I 0.10 (H) <0.031 ng/mL    Comment:        PERSISTENTLY INCREASED TROPONIN VALUES IN THE RANGE OF 0.04-0.49 ng/mL CAN BE SEEN IN:       -UNSTABLE ANGINA       -CONGESTIVE HEART FAILURE       -MYOCARDITIS       -CHEST TRAUMA       -ARRYHTHMIAS       -LATE PRESENTING MYOCARDIAL INFARCTION       -COPD   CLINICAL FOLLOW-UP RECOMMENDED.   Basic metabolic panel     Status: Abnormal   Collection Time: 10/02/15  3:35 AM  Result Value Ref Range   Sodium 144 135 - 145 mmol/L   Potassium 3.7 3.5 - 5.1 mmol/L   Chloride 101 101 - 111 mmol/L   CO2 30 22 - 32 mmol/L   Glucose, Bld 139 (H) 65 - 99 mg/dL   BUN 16 6 - 20 mg/dL   Creatinine, Ser 1.11 (H) 0.44 - 1.00 mg/dL   Calcium 9.4 8.9 -  10.3 mg/dL   GFR calc non Af Amer 46 (L) >60 mL/min   GFR calc Af Amer 53 (L) >60 mL/min    Comment: (NOTE) The eGFR has been calculated using the CKD EPI equation. This calculation has not been validated in all clinical situations. eGFR's persistently <60 mL/min signify possible Chronic Kidney Disease.    Anion gap 13 5 - 15  CBC     Status: Abnormal   Collection Time: 10/02/15  3:35 AM  Result Value Ref Range   WBC 8.6 4.0 - 10.5 K/uL   RBC 4.01 3.87 - 5.11 MIL/uL   Hemoglobin 11.1 (L) 12.0 - 15.0 g/dL   HCT 36.6 36.0 - 46.0 %   MCV 91.3 78.0 - 100.0 fL   MCH 27.7 26.0 - 34.0 pg   MCHC 30.3 30.0 - 36.0 g/dL   RDW 18.1 (H)  11.5 - 15.5 %   Platelets 371 150 - 400 K/uL  Basic metabolic panel     Status: Abnormal   Collection Time: 10/03/15  2:40 AM  Result Value Ref Range   Sodium 142 135 - 145 mmol/L   Potassium 3.6 3.5 - 5.1 mmol/L   Chloride 101 101 - 111 mmol/L   CO2 30 22 - 32 mmol/L   Glucose, Bld 79 65 - 99 mg/dL   BUN 34 (H) 6 - 20 mg/dL   Creatinine, Ser 1.24 (H) 0.44 - 1.00 mg/dL   Calcium 9.1 8.9 - 10.3 mg/dL   GFR calc non Af Amer 40 (L) >60 mL/min   GFR calc Af Amer 47 (L) >60 mL/min    Comment: (NOTE) The eGFR has been calculated using the CKD EPI equation. This calculation has not been validated in all clinical situations. eGFR's persistently <60 mL/min signify possible Chronic Kidney Disease.    Anion gap 11 5 - 15      Assessment/Plan:   Principal Problem:   Acute systolic congestive heart failure (HCC) Active Problems:   ICD in place   COPD (chronic obstructive pulmonary disease) (HCC)   Dementia   CKD (chronic kidney disease) stage 3, GFR 30-59 ml/min   Benign essential HTN   Acute respiratory failure ([place])   Time Spent Directly with Patient:  Pt with NISCM admitted with volume overload and CHF. Pacer interrogation no Afib. 1.8 liter neg I/O since admission. BUN and CR elevated today; hold lasix and resume lower dose in AM; BP borderline; change coreg to 6.25 BID and lisinopril to 2.5 daily and follow.  Length of Stay:  LOS: 2 days    Kirk Ruths 10/03/2015, 10:40 AM

## 2015-10-03 NOTE — Progress Notes (Signed)
[facility] Progress Note   [name]  [id number]  DOB: 1934-03-07  DOA: 10/01/2015 PCP: Reymundo Poll, MD  Brief narrative: [name] is a 81 y.o. female from skilled nursing facility with nonischemic cardiomyopathy,  AICD/pacemaker, chronic systolic failure, CK D3, hypertension, colon cancer status post resection, hypothyroidism presents to the hospital for acute shortness of breath and is found to have pulmonary edema. She is also noted to have mildly elevated troponin but has not had any chest pain.   Subjective: No complaints of dyspnea, chest pain, cough.   Assessment/Plan: Principal Problem:   Acute systolic congestive heart failure - elevated troponin- acute respiratory failure -2-D echo 6/16 shows an EF of 35-40% with hypokinesis of the inferior wall, anterior wall and septum - troponin trend flat- no further work up -on room air now- neg balance by 2300 cc - BUN and  Cr up today 34 and 1.24- will hold IV Lasix and lisinopril - BP low- cut Coreg from 12.5 to 3.215-  Hold Imdur -Cardiology assisting with management as well    Active Problems:   ICD/pacemaker in place -V paced    COPD (chronic obstructive pulmonary disease) -No wheezing noted continue albuterol inhaler every 6 hours    CKD (chronic kidney disease) stage 2- 3 -Creatinine ranges from 0.99 - 1.2    Benign essential HTN - hypotensive- see above about med changes for today  Code Status:     Code Status Orders        Start     Ordered   10/01/15 1527  Full code   Continuous     10/01/15 1526     Family Communication:  Disposition Plan: return to skilled nursing facility on Monday DVT prophylaxis: Heparin [facility] cardiology Procedures:  Antibiotics: Anti-infectives    None      Objective: Filed Weights   10/02/15 0459 10/02/15 1539 10/03/15 0536  Weight: 73.7 kg (162 lb 7.7 oz) 75.3 kg (166 lb 0.1 oz) 74.8 kg (164 lb 14.5 oz)    Intake/Output Summary (Last 24  hours) at 10/03/15 0851 Last data filed at 10/03/15 0725  Gross per 24 hour  Intake    600 ml  Output   1425 ml  Net   -825 ml     Vitals Filed Vitals:   10/02/15 2033 10/03/15 0033 10/03/15 0536 10/03/15 0838  BP: 109/42 100/43 114/45   Pulse: 88 87 87   Temp: 98.2 F (36.8 C) 97.8 F (36.6 C) 97.8 F (36.6 C)   TempSrc: Oral Oral Oral   Resp: 20 20 20    Height:      Weight:   74.8 kg (164 lb 14.5 oz)   SpO2: 100% 98% 97% 98%    Exam:  General:  Pt is alert, not in acute distress  HEENT: No icterus, No thrush, oral mucosa moist  Cardiovascular: regular rate and rhythm, S1/S2,    Respiratory: mild crackles in b/l bases- 98% on room air  Abdomen: Soft, +Bowel sounds, non tender, non distended, no guarding  MSK: No LE edema, cyanosis or clubbing  Data Reviewed: Basic Metabolic Panel:  Recent Labs Lab 10/01/15 1021 10/01/15 1700 10/02/15 0335 10/03/15 0240  NA 142  --  144 142  K 3.1*  --  3.7 3.6  CL 103  --  101 101  CO2 29  --  30 30  GLUCOSE 102*  --  139* 79  BUN 9  --  16 34*  CREATININE 0.99 0.99 1.11*  1.24*  CALCIUM 9.4  --  9.4 9.1   Liver Function Tests:  Recent Labs Lab 10/01/15 1021  AST 16  ALT 13*  ALKPHOS 59  BILITOT 1.0  PROT 7.3  ALBUMIN 3.0*    Recent Labs Lab 10/01/15 1021  LIPASE 21   No results for input(s): AMMONIA in the last 168 hours. CBC:  Recent Labs Lab 10/01/15 1021 10/01/15 1700 10/02/15 0335  WBC 7.2 9.2 8.6  NEUTROABS 4.7  --   --   HGB 10.7* 11.5* 11.1*  HCT 36.2 39.6 36.6  MCV 91.9 92.1 91.3  PLT 445* 343 371   Cardiac Enzymes:  Recent Labs Lab 10/01/15 1154 10/01/15 1700 10/01/15 2145 10/02/15 0335  TROPONINI 0.09* 0.17* 0.16* 0.10*   BNP (last 3 results)  Recent Labs  08/23/15 1327 10/01/15 1021 10/01/15 1700  BNP 2063.6* 2051.9* 3232.1*    ProBNP (last 3 results) No results for input(s): PROBNP in the last 8760 hours.  CBG:  Recent Labs Lab 10/02/15 0206  GLUCAP 112*     Recent Results (from the past 240 hour(s))  MRSA PCR Screening     Status: None   Collection Time: 10/01/15  3:27 PM  Result Value Ref Range Status   MRSA by PCR NEGATIVE NEGATIVE Final    Comment:        The GeneXpert MRSA Assay (FDA approved for NASAL specimens only), is one component of a comprehensive MRSA colonization surveillance program. It is not intended to diagnose MRSA infection nor to guide or monitor treatment for MRSA infections.      Studies: Dg Chest Portable 1 View  10/01/2015  CLINICAL DATA:  81 year old female with shortness of breath cough and congestion. Initial encounter. EXAM: PORTABLE CHEST 1 VIEW COMPARISON:  08/23/2015 and earlier. FINDINGS: Portable AP semi upright view at 1232 hours. Stable cardiomegaly and mediastinal contours. Left chest cardiac AICD appears stable. Lower lung volumes. Increased interstitial markings throughout both lungs. Obscuration now the left hemidiaphragm. No pneumothorax. No definite air bronchograms. IMPRESSION: Interval worsening bilateral ventilation with appearance most suggestive of pulmonary edema plus small left pleural effusion and left lung base atelectasis. Acute bilateral pneumonia felt less likely. Chronic cardiomegaly. Electronically Signed   By: Genevie Ann M.D.   On: 10/01/2015 12:46    Scheduled Meds:  Scheduled Meds: . albuterol  2.5 mg Nebulization TID  . aspirin EC  81 mg Oral Daily  . atorvastatin  20 mg Oral Daily  . carvedilol  12.5 mg Oral BID WC  . cholecalciferol  1,000 Units Oral Daily  . famotidine  10 mg Oral Daily  . feeding supplement (ENSURE ENLIVE)  237 mL Oral BID BM  . ferrous sulfate  325 mg Oral BH-q7a  . heparin  5,000 Units Subcutaneous 3 times per day  . isosorbide mononitrate  30 mg Oral Daily  . levothyroxine  50 mcg Oral BH-q7a  . polyvinyl alcohol  1 drop Left Eye BID  . potassium chloride  20 mEq Oral Daily  . sodium chloride  3 mL Intravenous Q12H   Continuous Infusions:    Time spent on care of this patient: 35 min   [place], MD 10/03/2015, 8:51 AM  LOS: 2 days   [facility] Office  [phone] Pager - Text Page per [email or web address] If 7PM-7AM, please contact night-coverage [email or web address]

## 2015-10-04 DIAGNOSIS — R778 Other specified abnormalities of plasma proteins: Secondary | ICD-10-CM

## 2015-10-04 DIAGNOSIS — I119 Hypertensive heart disease without heart failure: Secondary | ICD-10-CM

## 2015-10-04 DIAGNOSIS — I5043 Acute on chronic combined systolic (congestive) and diastolic (congestive) heart failure: Secondary | ICD-10-CM

## 2015-10-04 DIAGNOSIS — R7989 Other specified abnormal findings of blood chemistry: Secondary | ICD-10-CM

## 2015-10-04 DIAGNOSIS — Z95810 Presence of automatic (implantable) cardiac defibrillator: Secondary | ICD-10-CM

## 2015-10-04 DIAGNOSIS — I429 Cardiomyopathy, unspecified: Secondary | ICD-10-CM

## 2015-10-04 LAB — BASIC METABOLIC PANEL
ANION GAP: 9 (ref 5–15)
BUN: 28 mg/dL — ABNORMAL HIGH (ref 6–20)
CHLORIDE: 102 mmol/L (ref 101–111)
CO2: 32 mmol/L (ref 22–32)
Calcium: 9.1 mg/dL (ref 8.9–10.3)
Creatinine, Ser: 1.09 mg/dL — ABNORMAL HIGH (ref 0.44–1.00)
GFR calc Af Amer: 54 mL/min — ABNORMAL LOW (ref >60)
GFR, EST NON AFRICAN AMERICAN: 47 mL/min — AB (ref >60)
Glucose, Bld: 74 mg/dL (ref 65–99)
POTASSIUM: 3.5 mmol/L (ref 3.5–5.1)
SODIUM: 143 mmol/L (ref 135–145)

## 2015-10-04 MED ORDER — POTASSIUM CHLORIDE CRYS ER 20 MEQ PO TBCR
20.0000 meq | EXTENDED_RELEASE_TABLET | Freq: Two times a day (BID) | ORAL | Status: DC
  Administered 2015-10-04 – 2015-10-05 (×3): 20 meq via ORAL
  Filled 2015-10-04 (×3): qty 1

## 2015-10-04 MED ORDER — FUROSEMIDE 40 MG PO TABS
60.0000 mg | ORAL_TABLET | Freq: Every day | ORAL | Status: DC
  Administered 2015-10-04 – 2015-10-05 (×2): 60 mg via ORAL
  Filled 2015-10-04 (×2): qty 1

## 2015-10-04 MED ORDER — LEVOTHYROXINE SODIUM 50 MCG PO TABS
50.0000 ug | ORAL_TABLET | ORAL | Status: DC
  Administered 2015-10-05: 50 ug via ORAL
  Filled 2015-10-04: qty 1

## 2015-10-04 MED ORDER — FERROUS SULFATE 325 (65 FE) MG PO TABS
325.0000 mg | ORAL_TABLET | ORAL | Status: DC
  Administered 2015-10-05: 325 mg via ORAL
  Filled 2015-10-04: qty 1

## 2015-10-04 NOTE — NC FL2 (Signed)
[place] MEDICAID FL2 LEVEL OF CARE SCREENING TOOL     IDENTIFICATION  Patient Name: [name] Birthdate: October 27, 1933 Sex: female Admission Date (Current Location): 10/01/2015  [facility] and [place] Number:  Herbalist and Address:  [facility], [place] [address] [place], [place] [address]      Provider Number: [id number]  Attending Physician Name and Address:  Debbe Odea, MD  Relative Name and Phone Number:       Current Level of Care: Hospital Recommended Level of Care: [facility] Prior Approval Number:    Date Approved/Denied:   PASRR Number:    Discharge Plan: Domiciliary (Rest home)    Current Diagnoses: Patient Active Problem List   Diagnosis Date Noted  . Hypertensive heart disease 10/04/2015  . Elevated troponin 10/04/2015  . Acute respiratory failure ([place])   . Acute systolic congestive heart failure ([place])   . NICM (nonischemic cardiomyopathy) ([place]) 08/11/2015  . Normal coronary arteries 08/11/2015  . Dyslipidemia 07/27/2015  . CKD (chronic kidney disease) stage 3, GFR 30-59 ml/min 07/27/2015  . Anemia of chronic disease 07/27/2015  . Benign essential HTN 07/27/2015  . Hypothyroidism, adult 07/27/2015  . FUO (fever of unknown origin) 07/27/2015  . IDA (iron deficiency anemia) 07/27/2015  . Chronic systolic CHF (congestive heart failure) ([place]) 07/27/2015  . Colon cancer ([place]) 07/27/2015  . Sepsis ([place]) 07/24/2015  . Acute encephalopathy 07/24/2015  . Hypokalemia 07/24/2015  . Dementia 07/23/2015  . COPD (chronic obstructive pulmonary disease) ([place]) 04/25/2015  . ICD in place 04/14/2015  . Colostomy in place [facility]) 03/01/2015    Orientation RESPIRATION BLADDER Height & Weight    Self, Time, Situation, Place  Normal Continent 5\' 6"  (167.6 cm) 164 lbs.  BEHAVIORAL SYMPTOMS/MOOD NEUROLOGICAL BOWEL NUTRITION STATUS      Colostomy Diet  AMBULATORY STATUS COMMUNICATION OF NEEDS Skin   Limited Assist  Verbally Normal                       Personal Care Assistance Level of Assistance  Bathing, Dressing Bathing Assistance: Limited assistance   Dressing Assistance: Limited assistance     Functional Limitations Info             SPECIAL CARE FACTORS FREQUENCY  PT (By licensed PT)                    Contractures Contractures Info: Not present    Additional Factors Info  Code Status, Allergies Code Status Info: Full Allergies Info: NKA           Current Medications (10/04/2015):  This is the current hospital active medication list Current Facility-Administered Medications  Medication Dose Route Frequency Provider Last Rate Last Dose  . 0.9 %  sodium chloride infusion  250 mL Intravenous PRN Dionne Milo, NP      . acetaminophen (TYLENOL) tablet 650 mg  650 mg Oral Q4H PRN Dionne Milo, NP   650 mg at 10/04/15 0105  . albuterol (PROVENTIL) (2.5 MG/3ML) 0.083% nebulizer solution 2.5 mg  2.5 mg Nebulization TID Debbe Odea, MD   2.5 mg at 10/04/15 2102  . aspirin EC tablet 81 mg  81 mg Oral Daily Dionne Milo, NP   81 mg at 10/04/15 1044  . atorvastatin (LIPITOR) tablet 20 mg  20 mg Oral Daily Dionne Milo, NP   20 mg at 10/04/15 1045  . carvedilol (COREG) tablet 6.25 mg  6.25  mg Oral BID WC Lelon Perla, MD   6.25 mg at 10/04/15 1823  . cholecalciferol (VITAMIN D) tablet 1,000 Units  1,000 Units Oral Daily Dionne Milo, NP   1,000 Units at 10/04/15 1044  . famotidine (PEPCID) tablet 10 mg  10 mg Oral Daily Dionne Milo, NP   10 mg at 10/04/15 1044  . feeding supplement (ENSURE ENLIVE) (ENSURE ENLIVE) liquid 237 mL  237 mL Oral BID BM Dionne Milo, NP   237 mL at 10/04/15 1449  . [START ON 10/05/2015] ferrous sulfate tablet 325 mg  325 mg Oral BH-q7a Saima Rizwan, MD      . furosemide (LASIX) tablet 60 mg  60 mg Oral Daily Rogelia Mire, NP   60 mg at 10/04/15 1044  . heparin injection 5,000 Units  5,000 Units  Subcutaneous 3 times per day Dionne Milo, NP   5,000 Units at 10/04/15 1348  . [START ON 10/05/2015] levothyroxine (SYNTHROID, LEVOTHROID) tablet 50 mcg  50 mcg Oral BH-q7a Debbe Odea, MD      . lisinopril (PRINIVIL,ZESTRIL) tablet 2.5 mg  2.5 mg Oral Daily Lelon Perla, MD   2.5 mg at 10/04/15 1044  . ondansetron (ZOFRAN) injection 4 mg  4 mg Intravenous Q6H PRN Dionne Milo, NP      . polyvinyl alcohol (LIQUIFILM TEARS) 1.4 % ophthalmic solution 1 drop  1 drop Left Eye BID Elmarie Shiley, MD   1 drop at 10/04/15 1823  . potassium chloride SA (K-DUR,KLOR-CON) CR tablet 20 mEq  20 mEq Oral BID Rogelia Mire, NP   20 mEq at 10/04/15 1045  . sodium chloride 0.9 % injection 3 mL  3 mL Intravenous Q12H Dionne Milo, NP   3 mL at 10/04/15 1045  . sodium chloride 0.9 % injection 3 mL  3 mL Intravenous PRN Dionne Milo, NP         Discharge Medications: Please see discharge summary for a list of discharge medications.  Relevant Imaging Results:  Relevant Lab Results:   Additional Information SSN:  [id number]   Resident of [facility] [address], [place], [place]

## 2015-10-04 NOTE — Progress Notes (Signed)
[facility] Progress Note   VIRGIE HUBEL  [id number]  DOB: 19-Jan-1934  DOA: 10/01/2015 PCP: Reymundo Poll, MD  Brief narrative: [name] is a 81 y.o. female from skilled nursing facility with nonischemic cardiomyopathy,  AICD/pacemaker, chronic systolic failure, CK D3, hypertension, colon cancer status post resection, hypothyroidism presents to the hospital for acute shortness of breath and is found to have pulmonary edema. She is also noted to have mildly elevated troponin but has not had any chest pain.   Subjective: No complaints of dyspnea, cough or chest pain.   Assessment/Plan: Principal Problem:   Acute systolic congestive heart failure - elevated troponin- acute respiratory failure -2-D echo 6/16 shows an EF of 35-40% with hypokinesis of the inferior wall, anterior wall and septum - troponin trend flat- no further work up -on room air now- neg balance by 2300 cc - BUN and  Cr went up to 34 and 1.24- improving with holding IV Lasix and lisinopril - BP low- cut back Coreg from 12.5 to 3.215-  Holding Imdur -Cardiology assisting with management as well    Active Problems:   ICD/pacemaker in place -V paced    COPD (chronic obstructive pulmonary disease) -No wheezing noted continue albuterol inhaler every 6 hours    CKD (chronic kidney disease) stage 2- 3 -Creatinine ranges from 0.99 - 1.2    Benign essential HTN - hypotensive- see above about med changes - continued to be hypotensive.   Code Status:     Code Status Orders        Start     Ordered   10/01/15 1527  Full code   Continuous     10/01/15 1526     Family Communication:  Disposition Plan: return to skilled nursing facility tomorrow DVT prophylaxis: Heparin [facility] cardiology Procedures:  Antibiotics: Anti-infectives    None      Objective: Filed Weights   10/02/15 1539 10/03/15 0536 10/04/15 0442  Weight: 75.3 kg (166 lb 0.1 oz) 74.8 kg (164 lb 14.5 oz) 74.798 kg (164  lb 14.4 oz)    Intake/Output Summary (Last 24 hours) at 10/04/15 1603 Last data filed at 10/04/15 1458  Gross per 24 hour  Intake   1414 ml  Output    700 ml  Net    714 ml     Vitals Filed Vitals:   10/04/15 0442 10/04/15 0731 10/04/15 1128 10/04/15 1357  BP: 146/54  111/49   Pulse: 86  81   Temp: 98 F (36.7 C)     TempSrc: Oral     Resp: 20  18   Height:      Weight: 74.798 kg (164 lb 14.4 oz)     SpO2: 94% 96% 98% 94%    Exam:  General:  Pt is alert, not in acute distress  HEENT: No icterus, No thrush, oral mucosa moist  Cardiovascular: regular rate and rhythm, S1/S2,    Respiratory: mild crackles in b/l bases- 98% on room air  Abdomen: Soft, +Bowel sounds, non tender, non distended, no guarding  MSK: No LE edema, cyanosis or clubbing  Data Reviewed: Basic Metabolic Panel:  Recent Labs Lab 10/01/15 1021 10/01/15 1700 10/02/15 0335 10/03/15 0240 10/04/15 0502  NA 142  --  144 142 143  K 3.1*  --  3.7 3.6 3.5  CL 103  --  101 101 102  CO2 29  --  30 30 32  GLUCOSE 102*  --  139* 79 74  BUN 9  --  16 34* 28*  CREATININE 0.99 0.99 1.11* 1.24* 1.09*  CALCIUM 9.4  --  9.4 9.1 9.1   Liver Function Tests:  Recent Labs Lab 10/01/15 1021  AST 16  ALT 13*  ALKPHOS 59  BILITOT 1.0  PROT 7.3  ALBUMIN 3.0*    Recent Labs Lab 10/01/15 1021  LIPASE 21   No results for input(s): AMMONIA in the last 168 hours. CBC:  Recent Labs Lab 10/01/15 1021 10/01/15 1700 10/02/15 0335  WBC 7.2 9.2 8.6  NEUTROABS 4.7  --   --   HGB 10.7* 11.5* 11.1*  HCT 36.2 39.6 36.6  MCV 91.9 92.1 91.3  PLT 445* 343 371   Cardiac Enzymes:  Recent Labs Lab 10/01/15 1154 10/01/15 1700 10/01/15 2145 10/02/15 0335  TROPONINI 0.09* 0.17* 0.16* 0.10*   BNP (last 3 results)  Recent Labs  08/23/15 1327 10/01/15 1021 10/01/15 1700  BNP 2063.6* 2051.9* 3232.1*    ProBNP (last 3 results) No results for input(s): PROBNP in the last 8760  hours.  CBG:  Recent Labs Lab 10/02/15 0206  GLUCAP 112*    Recent Results (from the past 240 hour(s))  MRSA PCR Screening     Status: None   Collection Time: 10/01/15  3:27 PM  Result Value Ref Range Status   MRSA by PCR NEGATIVE NEGATIVE Final    Comment:        The GeneXpert MRSA Assay (FDA approved for NASAL specimens only), is one component of a comprehensive MRSA colonization surveillance program. It is not intended to diagnose MRSA infection nor to guide or monitor treatment for MRSA infections.      Studies: No results found.  Scheduled Meds:  Scheduled Meds: . albuterol  2.5 mg Nebulization TID  . aspirin EC  81 mg Oral Daily  . atorvastatin  20 mg Oral Daily  . carvedilol  6.25 mg Oral BID WC  . cholecalciferol  1,000 Units Oral Daily  . famotidine  10 mg Oral Daily  . feeding supplement (ENSURE ENLIVE)  237 mL Oral BID BM  . ferrous sulfate  325 mg Oral BH-q7a  . furosemide  60 mg Oral Daily  . heparin  5,000 Units Subcutaneous 3 times per day  . levothyroxine  50 mcg Oral BH-q7a  . lisinopril  2.5 mg Oral Daily  . polyvinyl alcohol  1 drop Left Eye BID  . potassium chloride  20 mEq Oral BID  . sodium chloride  3 mL Intravenous Q12H   Continuous Infusions:   Time spent on care of this patient: 35 min   [place], MD 10/04/2015, 4:03 PM  LOS: 3 days   [facility] Office  [phone] Pager - Text Page per [email or web address] If 7PM-7AM, please contact night-coverage [email or web address]

## 2015-10-04 NOTE — Progress Notes (Signed)
Patient Name: [name] Date of Encounter: 10/04/2015   Principal Problem:   Acute systolic congestive heart failure ([place]) Active Problems:   Dementia   NICM (nonischemic cardiomyopathy) (HCC)   Acute respiratory failure (HCC)   CKD (chronic kidney disease) stage 3, GFR 30-59 ml/min   Hypertensive heart disease   ICD in place   COPD (chronic obstructive pulmonary disease) (HCC)   Elevated troponin    SUBJECTIVE  Breathing improved.  No chest pain.    CURRENT MEDS . albuterol  2.5 mg Nebulization TID  . aspirin EC  81 mg Oral Daily  . atorvastatin  20 mg Oral Daily  . carvedilol  6.25 mg Oral BID WC  . cholecalciferol  1,000 Units Oral Daily  . famotidine  10 mg Oral Daily  . feeding supplement (ENSURE ENLIVE)  237 mL Oral BID BM  . ferrous sulfate  325 mg Oral BH-q7a  . heparin  5,000 Units Subcutaneous 3 times per day  . levothyroxine  50 mcg Oral BH-q7a  . lisinopril  2.5 mg Oral Daily  . polyvinyl alcohol  1 drop Left Eye BID  . potassium chloride  20 mEq Oral Daily  . sodium chloride  3 mL Intravenous Q12H    OBJECTIVE  Filed Vitals:   10/03/15 2042 10/04/15 0000 10/04/15 0442 10/04/15 0731  BP:   146/54   Pulse:   86   Temp:  97.9 F (36.6 C) 98 F (36.7 C)   TempSrc:  Oral Oral   Resp:   20   Height:      Weight:   164 lb 14.4 oz (74.798 kg)   SpO2: 99%  94% 96%    Intake/Output Summary (Last 24 hours) at 10/04/15 0839 Last data filed at 10/04/15 0700  Gross per 24 hour  Intake   1180 ml  Output    975 ml  Net    205 ml   Filed Weights   10/02/15 1539 10/03/15 0536 10/04/15 0442  Weight: 166 lb 0.1 oz (75.3 kg) 164 lb 14.5 oz (74.8 kg) 164 lb 14.4 oz (74.798 kg)    PHYSICAL EXAM  General: Pleasant, NAD. Neuro: Disoriented to time. Moves all extremities spontaneously. Follows commands. Psych: flat affect. HEENT:  Normal  Neck: Supple without bruits or JVD. Lungs:  Resp regular and unlabored, bibasilar crackles - improve with  additional deep breaths. Heart: RRR no s3, s4, 3/6 syst murmur throughout - loudest @ LLSB. Abdomen: Soft, non-tender, non-distended, BS + x 4.  Extremities: No clubbing, cyanosis.  Trace bilat LE edema. DP/PT/Radials 2+ and equal bilaterally.  Accessory Clinical Findings  CBC  Recent Labs  10/01/15 1021 10/01/15 1700 10/02/15 0335  WBC 7.2 9.2 8.6  NEUTROABS 4.7  --   --   HGB 10.7* 11.5* 11.1*  HCT 36.2 39.6 36.6  MCV 91.9 92.1 91.3  PLT 445* 343 123456   Basic Metabolic Panel  Recent Labs  10/03/15 0240 10/04/15 0502  NA 142 143  K 3.6 3.5  CL 101 102  CO2 30 32  GLUCOSE 79 74  BUN 34* 28*  CREATININE 1.24* 1.09*  CALCIUM 9.1 9.1   Liver Function Tests  Recent Labs  10/01/15 1021  AST 16  ALT 13*  ALKPHOS 59  BILITOT 1.0  PROT 7.3  ALBUMIN 3.0*    Recent Labs  10/01/15 1021  LIPASE 21   Cardiac Enzymes  Recent Labs  10/01/15 1700 10/01/15 2145 10/02/15 0335  TROPONINI 0.17* 0.16* 0.10*  TELE  A sensed, V paced.  Radiology/Studies  Dg Chest Portable 1 View  10/01/2015  CLINICAL DATA:  81 year old female with shortness of breath cough and congestion. Initial encounter. EXAM: PORTABLE CHEST 1 VIEW COMPARISON:  08/23/2015 and earlier. FINDINGS: Portable AP semi upright view at 1232 hours. Stable cardiomegaly and mediastinal contours. Left chest cardiac AICD appears stable. Lower lung volumes. Increased interstitial markings throughout both lungs. Obscuration now the left hemidiaphragm. No pneumothorax. No definite air bronchograms. IMPRESSION: Interval worsening bilateral ventilation with appearance most suggestive of pulmonary edema plus small left pleural effusion and left lung base atelectasis. Acute bilateral pneumonia felt less likely. Chronic cardiomegaly. Electronically Signed   By: Genevie Ann M.D.   On: 10/01/2015 12:46    ASSESSMENT AND PLAN  1.  Acute on chronic systolic CHF/NICM/Acute respiratory Failure:  Pt admitted 12/23 with  worsening dyspnea requiring bipap upon arrival.  Symptomatically improved with diuresis.  Net negative of 2.2 L this admission.  Wt down 2 lbs since 12/24.  Wt down significantly since November (20 lbs).  Lasix held yesterday 2/2 creat bump (was receiving 40 IV bid).  Creat better this AM.  Volume appears stable.  She had been on lasix 60 MWF and 40 all other days.  Start 60 daily.  Ambulate.  Poss d/c today or tomorrow with early f/u w/in a week to reassess volume, renal fxn, and K.  Cont bb/acei.  2.  Hypertensive Heart Disease: BP higher w/ lower dose of Acei.  Cont coreg @ current dose.  Follow.  3.  CKD III:  Creat stable after holding lasix yesterday.  Resume PO lasix today.  4.  Elevated troponin: Mild elevation with flat trend.  Suspect demand ischemia in the setting of #1.  No chest pain.  Known nl cors.  Cont med Rx without plan for additional ischemic eval.  5. Dementia:  Very pleasant.  Stays @ SNF.  6.  Hypokalemi:  Supp.  Signed, Murray Hodgkins NP  I have seen and examined the patient along with Murray Hodgkins, NP.  I have reviewed the chart, notes and new data.  I agree with NP's note.  Key new complaints: breathing greatly improved since admission Key examination changes: in/out balanced and renal function at baseline on current diuretic dose Key new findings / data: K 3.5; A sensed V paced rhythm on monitor  PLAN:  Need to recheck her ICD. The only documentation I find is "normal [employer] ICD" in a note in July. Unclear if this is CRT-D or conventional ICD. I only see 2 leads on CXR, c/w conventional device. If so and if she has high prevalence of V pacing, could explain worsening HF. Agree that otherwise she is close to discharge.  Sanda Klein, MD, [place] [phone] 10/04/2015, 9:45 AM

## 2015-10-04 NOTE — Evaluation (Signed)
Physical Therapy Evaluation Patient Details Name: [name] MRN: [id number] DOB: June 30, 1934 Today's Date: 10/04/2015   History of Present Illness  81 y.o. female with COPD, DM, HLD, systolic CHF with last EF 46 - 40% in June 2016, colon cancer and status post resection and now with colostomy, presented from SNF for evaluation of confusion and urinary incontinence, unable to ambulate with walker  Clinical Impression  Pt admitted with above diagnosis. Pt currently with functional limitations due to the deficits listed below (see PT Problem List). Pt was able to ambulate in room with supervisiion only.  Pt states she has this help at A living.  Feel that pt can go back to A living with HHPT f/u.  Will follow acutely.  Pt will benefit from skilled PT to increase their independence and safety with mobility to allow discharge to the venue listed below.     Follow Up Recommendations Home health PT;Supervision - Intermittent    Equipment Recommendations  None recommended by PT    Recommendations for Other Services       Precautions / Restrictions Precautions Precautions: Fall Restrictions Weight Bearing Restrictions: No      Mobility  Bed Mobility Overal bed mobility: Independent                Transfers Overall transfer level: Modified independent Equipment used: Rolling walker (2 wheeled)             General transfer comment: Safe with transition to RW.   Ambulation/Gait Ambulation/Gait assistance: Min guard Ambulation Distance (Feet): 20 Feet Assistive device: Rolling walker (2 wheeled) Gait Pattern/deviations: Step-through pattern;Decreased stride length;Trunk flexed;Wide base of support   Gait velocity interpretation: Below normal speed for age/gender General Gait Details: Pt ambulated with RW in room with no need for verbal cues with good overall safety with RW.  Pt states she feels she is ready to return to A living.   Stairs            Wheelchair  Mobility    Modified Rankin (Stroke Patients Only)       Balance Overall balance assessment: Needs assistance Sitting-balance support: No upper extremity supported;Feet supported Sitting balance-Leahy Scale: Good     Standing balance support: Bilateral upper extremity supported;During functional activity Standing balance-Leahy Scale: Fair Standing balance comment: can stand statically without UE support.              High level balance activites: Direction changes;Turns;Backward walking High Level Balance Comments: all of above with supervision             Pertinent Vitals/Pain Pain Assessment: No/denies pain  VSS    Home Living Family/patient expects to be discharged to:: Skilled nursing facility                 Additional Comments: pt from ALF    Prior Function Level of Independence: Needs assistance   Gait / Transfers Assistance Needed: States she ambulates with assist and RW per previous notes in chart--pt reports she is mod I this adm--?  ADL's / Homemaking Assistance Needed: assisted for LB bathing and dressing and for toileting, could perform grooming, self feeding (per previous notes)        Hand Dominance   Dominant Hand: Right    Extremity/Trunk Assessment   Upper Extremity Assessment: Defer to OT evaluation           Lower Extremity Assessment: Overall WFL for tasks assessed      Cervical / Trunk Assessment:  Kyphotic  Communication   Communication: No difficulties  Cognition Arousal/Alertness: Awake/alert Behavior During Therapy: WFL for tasks assessed/performed Overall Cognitive Status: History of cognitive impairments - at baseline                      General Comments      Exercises        Assessment/Plan    PT Assessment Patient needs continued PT services  PT Diagnosis Generalized weakness   PT Problem List Decreased activity tolerance;Decreased balance;Decreased mobility;Decreased knowledge of use of  DME;Decreased safety awareness;Decreased knowledge of precautions  PT Treatment Interventions DME instruction;Gait training;Functional mobility training;Therapeutic activities;Therapeutic exercise;Balance training;Patient/family education   PT Goals (Current goals can be found in the Care Plan section) Acute Rehab PT Goals Patient Stated Goal: to go back to A lving PT Goal Formulation: With patient Time For Goal Achievement: 10/11/15 Potential to Achieve Goals: Good    Frequency Min 3X/week   Barriers to discharge        Co-evaluation               End of Session Equipment Utilized During Treatment: Gait belt Activity Tolerance: Patient limited by fatigue Patient left: with call bell/phone within reach;in bed;with bed alarm set Nurse Communication: Mobility status         Time: [id number] PT Time Calculation (min) (ACUTE ONLY): 11 min   Charges:   PT Evaluation $Initial PT Evaluation Tier I: 1 Procedure     PT G CodesDenice Paradise 10-13-2015, 4:15 PM [place] [facility] Acute Rehabilitation [phone] (pager)

## 2015-10-05 ENCOUNTER — Encounter (HOSPITAL_COMMUNITY): Payer: Self-pay | Admitting: *Deleted

## 2015-10-05 DIAGNOSIS — I110 Hypertensive heart disease with heart failure: Secondary | ICD-10-CM

## 2015-10-05 LAB — BASIC METABOLIC PANEL
Anion gap: 8 (ref 5–15)
BUN: 25 mg/dL — AB (ref 6–20)
CALCIUM: 9.3 mg/dL (ref 8.9–10.3)
CO2: 30 mmol/L (ref 22–32)
CREATININE: 1.07 mg/dL — AB (ref 0.44–1.00)
Chloride: 102 mmol/L (ref 101–111)
GFR calc non Af Amer: 48 mL/min — ABNORMAL LOW (ref >60)
GFR, EST AFRICAN AMERICAN: 56 mL/min — AB (ref >60)
GLUCOSE: 80 mg/dL (ref 65–99)
Potassium: 4.2 mmol/L (ref 3.5–5.1)
Sodium: 140 mmol/L (ref 135–145)

## 2015-10-05 NOTE — Progress Notes (Signed)
Patient will discharge to [facility] Anticipated discharge date: 12/27 Family notified: pt son- Education administrator by [employer]- called at 2:45pm  [place] signing off.  Domenica Reamer, [place] Social Worker [phone]

## 2015-10-05 NOTE — Care Management Important Message (Signed)
Important Message  Patient Details  Name: [name] MRN: [id number] Date of Birth: [birth year]   Medicare Important Message Given:  Yes    Nathen May 10/05/2015, 12:07 PM

## 2015-10-05 NOTE — Progress Notes (Signed)
Report called off to [place] a nurse at [facility]. Pt discharge education and instructions completed. Pt discharge to SNF with PTAR to transport her to facility. Pt cleaned and changed into clean gown and colostomy care done and bag emptied. Pt IV and telemetry removed; pt transported off unit via stretcher with belongings to the side. Francis Gaines Sowmya Partridge RN.

## 2015-10-05 NOTE — NC FL2 (Signed)
MEDICAID FL2 LEVEL OF CARE SCREENING TOOL     IDENTIFICATION  Patient Name: [name] Birthdate: 12/31/1933 Sex: female Admission Date (Current Location): 10/01/2015  [facility] and [place] Number:  Herbalist and Address:  The . [facility], [place] [address], [place], [place] [address]      Provider Number: [id number]  Attending Physician Name and Address:  Debbe Odea, MD  Relative Name and Phone Number:       Current Level of Care: Hospital Recommended Level of Care: [place] Prior Approval Number:    Date Approved/Denied:   PASRR Number:    Discharge Plan: ALF   Current Diagnoses: Patient Active Problem List   Diagnosis Date Noted  . Hypertensive heart disease 10/04/2015  . Elevated troponin 10/04/2015  . Acute respiratory failure ([place])   . Acute systolic congestive heart failure ([place])   . NICM (nonischemic cardiomyopathy) ([place]) 08/11/2015  . Normal coronary arteries 08/11/2015  . Dyslipidemia 07/27/2015  . CKD (chronic kidney disease) stage 3, GFR 30-59 ml/min 07/27/2015  . Anemia of chronic disease 07/27/2015  . Benign essential HTN 07/27/2015  . Hypothyroidism, adult 07/27/2015  . FUO (fever of unknown origin) 07/27/2015  . IDA (iron deficiency anemia) 07/27/2015  . Chronic systolic CHF (congestive heart failure) ([place]) 07/27/2015  . Colon cancer ([place]) 07/27/2015  . Sepsis ([place]) 07/24/2015  . Acute encephalopathy 07/24/2015  . Hypokalemia 07/24/2015  . Dementia 07/23/2015  . COPD (chronic obstructive pulmonary disease) ([place]) 04/25/2015  . ICD in place 04/14/2015  . Colostomy in place [facility]) 03/01/2015    Orientation RESPIRATION BLADDER Height & Weight    Self, Time, Situation, Place  Normal Continent 5\' 6"  (167.6 cm) 164 lbs.  BEHAVIORAL SYMPTOMS/MOOD NEUROLOGICAL BOWEL NUTRITION STATUS      Colostomy Low sodium heart healthy diet  AMBULATORY STATUS COMMUNICATION OF NEEDS Skin   Limited Assist  Verbally Normal                       Personal Care Assistance Level of Assistance  Bathing, Dressing Bathing Assistance: Limited assistance   Dressing Assistance: Limited assistance     Functional Limitations Info             SPECIAL CARE FACTORS FREQUENCY  PT (By licensed PT)                    Contractures Contractures Info: Not present    Additional Factors Info  Code Status, Allergies Code Status Info: Full Allergies Info: NKA             Discharge Medications: Medication List    TAKE these medications       acetaminophen 325 MG tablet  Commonly known as: TYLENOL  Take 650 mg by mouth every 6 (six) hours as needed (pain).     ADVAIR DISKUS 500-50 MCG/DOSE Aepb  Generic drug: Fluticasone-Salmeterol  Inhale 1 puff into the lungs every 12 (twelve) hours.     albuterol (2.5 MG/3ML) 0.083% nebulizer solution  Commonly known as: PROVENTIL  Take 3 mLs (2.5 mg total) by nebulization every 4 (four) hours as needed for wheezing.     aspirin EC 81 MG tablet  Take 1 tablet (81 mg total) by mouth every morning.     atorvastatin 20 MG tablet  Commonly known as: LIPITOR  Take 20 mg by mouth daily.     carvedilol 6.25 MG tablet  Commonly known as: COREG  Take 1 tablet (6.25 mg total) by mouth 2 (two) times daily with a meal.     cholecalciferol 1000 UNITS tablet  Commonly known as: VITAMIN D  Take 1,000 Units by mouth daily.     diclofenac sodium 1 % Gel  Commonly known as: VOLTAREN  Apply 4 g topically 3 (three) times daily.     feeding supplement (ENSURE ENLIVE) Liqd  Take 237 mLs by mouth 2 (two) times daily between meals.     ferrous sulfate 325 (65 FE) MG tablet  Take 325 mg by mouth every morning.     furosemide 40 MG tablet  Commonly known as: LASIX  Take 1-1.5 tablets (40-60 mg total) by mouth as directed.     HYDROcodone-acetaminophen 5-325 MG tablet  Commonly  known as: NORCO/VICODIN  Take 1-2 tablets by mouth every 4 (four) hours as needed for moderate pain.     isosorbide mononitrate 30 MG 24 hr tablet  Commonly known as: IMDUR  Take 1 tablet (30 mg total) by mouth daily.     lactobacilus acidophilus & bulgar Tabs chewable tablet  Take 1 tablet by mouth 3 (three) times daily with meals.     levothyroxine 50 MCG tablet  Commonly known as: SYNTHROID, LEVOTHROID  Take 50 mcg by mouth every morning.     lisinopril 20 MG tablet  Commonly known as: PRINIVIL,ZESTRIL  Take 20 mg by mouth daily.     oxymetazoline 0.05 % nasal spray  Commonly known as: AFRIN  Place 1 spray into both nostrils 2 (two) times daily as needed for congestion.     ranitidine 150 MG tablet  Commonly known as: ZANTAC  Take 150 mg by mouth daily.     SYSTANE OP  Place 1 drop into the left eye 2 (two) times daily.         .  Relevant Imaging Results:  Relevant Lab Results:   Additional Information SSN:  [id number]   Resident of [facility] Patient will need PT to follow at [facility].  [place], [place], [place]

## 2015-10-05 NOTE — Discharge Summary (Addendum)
Physician Discharge Summary  [name] [id number] DOB: 10/25/1933 DOA: 10/01/2015  PCP: Reymundo Poll, MD  Admit date: 10/01/2015 Discharge date: 10/05/2015  Time spent: 50 minutes  Recommendations for Outpatient Follow-up:  1. daily weights  Discharge Condition: stable    Discharge Diagnoses:  Principal Problem:   Acute systolic congestive heart failure ([place]) Active Problems:   ICD in place   COPD (chronic obstructive pulmonary disease) (HCC)   Dementia   CKD (chronic kidney disease) stage 3, GFR 30-59 ml/min   NICM (nonischemic cardiomyopathy) ([place])   Acute respiratory failure (HCC)   Hypertensive heart disease   Elevated troponin   History of present illness:  [name] is a 81 y.o. female from skilled nursing facility with nonischemic cardiomyopathy, AICD/pacemaker, chronic systolic failure, CKD3, hypertension, colon cancer status post resection, hypothyroidism presents to the hospital for acute shortness of breath and is found to have pulmonary edema. She is also noted to have mildly elevated troponin but has not had any chest pain.  Hospital Course:  Principal Problem:  Acute systolic congestive heart failure - elevated troponin- acute respiratory failure -2-D echo 6/16 shows an EF of 35-40% with hypokinesis of the inferior wall, anterior wall and septum - troponin trend flat- no further work up - diuresed well but became hypovolemic and BUN and Cr went up to 34 and 1.24- improving with holding IV Lasix and lisinopril    Active Problems: Benign essential HTN - became hypotensive with diuresis-  improved with holding Lasix, Lisinopril and Coreg - BP elevated again this AM therefore safe to resume antihypertensives  ICD/pacemaker in place -V paced- pacer interrogated- no a fib noted.    COPD (chronic obstructive pulmonary disease) -No wheezing noted    CKD (chronic kidney disease) stage 2- 3 -Creatinine ranges from 0.99 -  1.2   Consultations:  cardiology  Discharge Exam: Filed Weights   10/03/15 0536 10/04/15 0442 10/05/15 0420  Weight: 74.8 kg (164 lb 14.5 oz) 74.798 kg (164 lb 14.4 oz) 76.159 kg (167 lb 14.4 oz)   Filed Vitals:   10/04/15 2005 10/05/15 0420  BP: 131/49 148/69  Pulse: 87 92  Temp: 98 F (36.7 C) 98 F (36.7 C)  Resp: 18 18    General: AAO x 3, no distress Cardiovascular: RRR, no murmurs  Respiratory: clear to auscultation bilaterally GI: soft, non-tender, non-distended, bowel sound positive  Discharge Instructions You were cared for by a hospitalist during your hospital stay. If you have any questions about your discharge medications or the care you received while you were in the hospital after you are discharged, you can call the unit and asked to speak with the hospitalist on call if the hospitalist that took care of you is not available. Once you are discharged, your primary care physician will handle any further medical issues. Please note that NO REFILLS for any discharge medications will be authorized once you are discharged, as it is imperative that you return to your primary care physician (or establish a relationship with a primary care physician if you do not have one) for your aftercare needs so that they can reassess your need for medications and monitor your lab values.  Discharge Instructions    (HEART FAILURE PATIENTS) Call MD:  Anytime you have any of the following symptoms: 1) 3 pound weight gain in 24 hours or 5 pounds in 1 week 2) shortness of breath, with or without a dry hacking cough 3) swelling in the hands, feet or stomach  4) if you have to sleep on extra pillows at night in order to breathe.    Complete by:  As directed      Diet - low sodium heart healthy    Complete by:  As directed      Increase activity slowly    Complete by:  As directed             Medication List    TAKE these medications        acetaminophen 325 MG tablet  Commonly known  as:  TYLENOL  Take 650 mg by mouth every 6 (six) hours as needed (pain).     ADVAIR DISKUS 500-50 MCG/DOSE Aepb  Generic drug:  Fluticasone-Salmeterol  Inhale 1 puff into the lungs every 12 (twelve) hours.     albuterol (2.5 MG/3ML) 0.083% nebulizer solution  Commonly known as:  PROVENTIL  Take 3 mLs (2.5 mg total) by nebulization every 4 (four) hours as needed for wheezing.     aspirin EC 81 MG tablet  Take 1 tablet (81 mg total) by mouth every morning.     atorvastatin 20 MG tablet  Commonly known as:  LIPITOR  Take 20 mg by mouth daily.     carvedilol 6.25 MG tablet  Commonly known as:  COREG  Take 1 tablet (6.25 mg total) by mouth 2 (two) times daily with a meal.     cholecalciferol 1000 UNITS tablet  Commonly known as:  VITAMIN D  Take 1,000 Units by mouth daily.     diclofenac sodium 1 % Gel  Commonly known as:  VOLTAREN  Apply 4 g topically 3 (three) times daily.     feeding supplement (ENSURE ENLIVE) Liqd  Take 237 mLs by mouth 2 (two) times daily between meals.     ferrous sulfate 325 (65 FE) MG tablet  Take 325 mg by mouth every morning.     furosemide 40 MG tablet  Commonly known as:  LASIX  Take 1-1.5 tablets (40-60 mg total) by mouth as directed.     HYDROcodone-acetaminophen 5-325 MG tablet  Commonly known as:  NORCO/VICODIN  Take 1-2 tablets by mouth every 4 (four) hours as needed for moderate pain.     isosorbide mononitrate 30 MG 24 hr tablet  Commonly known as:  IMDUR  Take 1 tablet (30 mg total) by mouth daily.     lactobacilus acidophilus & bulgar Tabs chewable tablet  Take 1 tablet by mouth 3 (three) times daily with meals.     levothyroxine 50 MCG tablet  Commonly known as:  SYNTHROID, LEVOTHROID  Take 50 mcg by mouth every morning.     lisinopril 20 MG tablet  Commonly known as:  PRINIVIL,ZESTRIL  Take 20 mg by mouth daily.     oxymetazoline 0.05 % nasal spray  Commonly known as:  AFRIN  Place 1 spray into both nostrils 2 (two) times  daily as needed for congestion.     ranitidine 150 MG tablet  Commonly known as:  ZANTAC  Take 150 mg by mouth daily.     SYSTANE OP  Place 1 drop into the left eye 2 (two) times daily.       No Known Allergies    The results of significant diagnostics from this hospitalization (including imaging, microbiology, ancillary and laboratory) are listed below for reference.    Significant Diagnostic Studies: Dg Chest Portable 1 View  10/01/2015  CLINICAL DATA:  81 year old female with shortness of breath cough and congestion. Initial  encounter. EXAM: PORTABLE CHEST 1 VIEW COMPARISON:  08/23/2015 and earlier. FINDINGS: Portable AP semi upright view at 1232 hours. Stable cardiomegaly and mediastinal contours. Left chest cardiac AICD appears stable. Lower lung volumes. Increased interstitial markings throughout both lungs. Obscuration now the left hemidiaphragm. No pneumothorax. No definite air bronchograms. IMPRESSION: Interval worsening bilateral ventilation with appearance most suggestive of pulmonary edema plus small left pleural effusion and left lung base atelectasis. Acute bilateral pneumonia felt less likely. Chronic cardiomegaly. Electronically Signed   By: Genevie Ann M.D.   On: 10/01/2015 12:46    Microbiology: Recent Results (from the past 240 hour(s))  MRSA PCR Screening     Status: None   Collection Time: 10/01/15  3:27 PM  Result Value Ref Range Status   MRSA by PCR NEGATIVE NEGATIVE Final    Comment:        The GeneXpert MRSA Assay (FDA approved for NASAL specimens only), is one component of a comprehensive MRSA colonization surveillance program. It is not intended to diagnose MRSA infection nor to guide or monitor treatment for MRSA infections.      Labs: Basic Metabolic Panel:  Recent Labs Lab 10/01/15 1021 10/01/15 1700 10/02/15 0335 10/03/15 0240 10/04/15 0502 10/05/15 0356  NA 142  --  144 142 143 140  K 3.1*  --  3.7 3.6 3.5 4.2  CL 103  --  101 101 102  102  CO2 29  --  30 30 32 30  GLUCOSE 102*  --  139* 79 74 80  BUN 9  --  16 34* 28* 25*  CREATININE 0.99 0.99 1.11* 1.24* 1.09* 1.07*  CALCIUM 9.4  --  9.4 9.1 9.1 9.3   Liver Function Tests:  Recent Labs Lab 10/01/15 1021  AST 16  ALT 13*  ALKPHOS 59  BILITOT 1.0  PROT 7.3  ALBUMIN 3.0*    Recent Labs Lab 10/01/15 1021  LIPASE 21   No results for input(s): AMMONIA in the last 168 hours. CBC:  Recent Labs Lab 10/01/15 1021 10/01/15 1700 10/02/15 0335  WBC 7.2 9.2 8.6  NEUTROABS 4.7  --   --   HGB 10.7* 11.5* 11.1*  HCT 36.2 39.6 36.6  MCV 91.9 92.1 91.3  PLT 445* 343 371   Cardiac Enzymes:  Recent Labs Lab 10/01/15 1154 10/01/15 1700 10/01/15 2145 10/02/15 0335  TROPONINI 0.09* 0.17* 0.16* 0.10*   BNP: BNP (last 3 results)  Recent Labs  08/23/15 1327 10/01/15 1021 10/01/15 1700  BNP 2063.6* 2051.9* 3232.1*    ProBNP (last 3 results) No results for input(s): PROBNP in the last 8760 hours.  CBG:  Recent Labs Lab 10/02/15 0206  GLUCAP 112*       SignedDebbe Odea, MD [facility] 10/05/2015, 7:16 AM

## 2015-10-28 ENCOUNTER — Encounter (HOSPITAL_COMMUNITY): Payer: Self-pay | Admitting: Emergency Medicine

## 2015-10-28 ENCOUNTER — Emergency Department (HOSPITAL_COMMUNITY): Payer: Medicare HMO

## 2015-10-28 ENCOUNTER — Emergency Department (HOSPITAL_COMMUNITY)
Admission: EM | Admit: 2015-10-28 | Discharge: 2015-10-29 | Disposition: A | Discharge status: Home / Self Care | Payer: Medicare HMO | Attending: Emergency Medicine | Admitting: Emergency Medicine

## 2015-10-28 DIAGNOSIS — Z85038 Personal history of other malignant neoplasm of large intestine: Secondary | ICD-10-CM | POA: Diagnosis not present

## 2015-10-28 DIAGNOSIS — I209 Angina pectoris, unspecified: Secondary | ICD-10-CM | POA: Insufficient documentation

## 2015-10-28 DIAGNOSIS — E119 Type 2 diabetes mellitus without complications: Secondary | ICD-10-CM | POA: Insufficient documentation

## 2015-10-28 DIAGNOSIS — R011 Cardiac murmur, unspecified: Secondary | ICD-10-CM | POA: Diagnosis not present

## 2015-10-28 DIAGNOSIS — Z79899 Other long term (current) drug therapy: Secondary | ICD-10-CM | POA: Insufficient documentation

## 2015-10-28 DIAGNOSIS — Z7982 Long term (current) use of aspirin: Secondary | ICD-10-CM | POA: Diagnosis not present

## 2015-10-28 DIAGNOSIS — I10 Essential (primary) hypertension: Secondary | ICD-10-CM | POA: Diagnosis not present

## 2015-10-28 DIAGNOSIS — E876 Hypokalemia: Secondary | ICD-10-CM | POA: Insufficient documentation

## 2015-10-28 DIAGNOSIS — I509 Heart failure, unspecified: Secondary | ICD-10-CM | POA: Diagnosis not present

## 2015-10-28 DIAGNOSIS — J449 Chronic obstructive pulmonary disease, unspecified: Secondary | ICD-10-CM | POA: Diagnosis not present

## 2015-10-28 DIAGNOSIS — J849 Interstitial pulmonary disease, unspecified: Secondary | ICD-10-CM | POA: Diagnosis not present

## 2015-10-28 DIAGNOSIS — Z86711 Personal history of pulmonary embolism: Secondary | ICD-10-CM | POA: Insufficient documentation

## 2015-10-28 DIAGNOSIS — Z8701 Personal history of pneumonia (recurrent): Secondary | ICD-10-CM | POA: Insufficient documentation

## 2015-10-28 DIAGNOSIS — R609 Edema, unspecified: Secondary | ICD-10-CM

## 2015-10-28 DIAGNOSIS — R531 Weakness: Secondary | ICD-10-CM | POA: Diagnosis present

## 2015-10-28 LAB — COMPREHENSIVE METABOLIC PANEL
ALT: 19 U/L (ref 14–54)
ANION GAP: 11 (ref 5–15)
AST: 23 U/L (ref 15–41)
Albumin: 3.7 g/dL (ref 3.5–5.0)
Alkaline Phosphatase: 56 U/L (ref 38–126)
BUN: 19 mg/dL (ref 6–20)
CALCIUM: 9.3 mg/dL (ref 8.9–10.3)
CHLORIDE: 101 mmol/L (ref 101–111)
CO2: 27 mmol/L (ref 22–32)
CREATININE: 0.95 mg/dL (ref 0.44–1.00)
GFR, EST NON AFRICAN AMERICAN: 55 mL/min — AB (ref >60)
Glucose, Bld: 88 mg/dL (ref 65–99)
Potassium: 3.3 mmol/L — ABNORMAL LOW (ref 3.5–5.1)
SODIUM: 139 mmol/L (ref 135–145)
Total Bilirubin: 0.9 mg/dL (ref 0.3–1.2)
Total Protein: 7.7 g/dL (ref 6.5–8.1)

## 2015-10-28 LAB — CBC WITH DIFFERENTIAL/PLATELET
BASOS ABS: 0 10*3/uL (ref 0.0–0.1)
Basophils Relative: 0 %
EOS ABS: 0 10*3/uL (ref 0.0–0.7)
EOS PCT: 0 %
HCT: 31.3 % — ABNORMAL LOW (ref 36.0–46.0)
HEMOGLOBIN: 9.5 g/dL — AB (ref 12.0–15.0)
LYMPHS ABS: 2.1 10*3/uL (ref 0.7–4.0)
LYMPHS PCT: 34 %
MCH: 27.3 pg (ref 26.0–34.0)
MCHC: 30.4 g/dL (ref 30.0–36.0)
MCV: 89.9 fL (ref 78.0–100.0)
Monocytes Absolute: 0.8 10*3/uL (ref 0.1–1.0)
Monocytes Relative: 14 %
NEUTROS PCT: 52 %
Neutro Abs: 3.1 10*3/uL (ref 1.7–7.7)
PLATELETS: 265 10*3/uL (ref 150–400)
RBC: 3.48 MIL/uL — AB (ref 3.87–5.11)
RDW: 17.2 % — ABNORMAL HIGH (ref 11.5–15.5)
WBC: 6 10*3/uL (ref 4.0–10.5)

## 2015-10-28 LAB — URINALYSIS, ROUTINE W REFLEX MICROSCOPIC
Bilirubin Urine: NEGATIVE
Glucose, UA: NEGATIVE mg/dL
KETONES UR: NEGATIVE mg/dL
NITRITE: NEGATIVE
PROTEIN: 100 mg/dL — AB
Specific Gravity, Urine: 1.019 (ref 1.005–1.030)
pH: 5.5 (ref 5.0–8.0)

## 2015-10-28 LAB — TROPONIN I: TROPONIN I: 0.04 ng/mL — AB (ref <0.031)

## 2015-10-28 LAB — CBG MONITORING, ED: GLUCOSE-CAPILLARY: 68 mg/dL (ref 65–99)

## 2015-10-28 LAB — URINE MICROSCOPIC-ADD ON

## 2015-10-28 LAB — BRAIN NATRIURETIC PEPTIDE: B NATRIURETIC PEPTIDE 5: 1406.5 pg/mL — AB (ref 0.0–100.0)

## 2015-10-28 MED ORDER — SODIUM CHLORIDE 0.9 % IV SOLN
Freq: Once | INTRAVENOUS | Status: AC
  Administered 2015-10-28: 18:00:00 via INTRAVENOUS

## 2015-10-28 MED ORDER — AZITHROMYCIN 250 MG PO TABS
500.0000 mg | ORAL_TABLET | Freq: Once | ORAL | Status: AC
  Administered 2015-10-28: 500 mg via ORAL
  Filled 2015-10-28: qty 2

## 2015-10-28 MED ORDER — ALBUTEROL SULFATE (2.5 MG/3ML) 0.083% IN NEBU
INHALATION_SOLUTION | RESPIRATORY_TRACT | Status: AC
  Filled 2015-10-28: qty 3

## 2015-10-28 MED ORDER — AZITHROMYCIN 250 MG PO TABS: 250.0000 mg | ORAL_TABLET | Freq: Every day | ORAL | Status: DC

## 2015-10-28 MED ORDER — ACETAMINOPHEN 500 MG PO TABS
500.0000 mg | ORAL_TABLET | Freq: Once | ORAL | Status: AC
  Administered 2015-10-28: 500 mg via ORAL
  Filled 2015-10-28: qty 1

## 2015-10-28 MED ORDER — ALBUTEROL SULFATE (2.5 MG/3ML) 0.083% IN NEBU
5.0000 mg | INHALATION_SOLUTION | Freq: Once | RESPIRATORY_TRACT | Status: AC
  Administered 2015-10-28: 5 mg via RESPIRATORY_TRACT
  Filled 2015-10-28: qty 6

## 2015-10-28 MED ORDER — POTASSIUM CHLORIDE ER 10 MEQ PO TBCR: 10.0000 meq | EXTENDED_RELEASE_TABLET | Freq: Every day | ORAL | Status: DC

## 2015-10-28 MED ORDER — FUROSEMIDE 10 MG/ML IJ SOLN
40.0000 mg | INTRAMUSCULAR | Status: AC
  Administered 2015-10-28: 40 mg via INTRAVENOUS
  Filled 2015-10-28: qty 4

## 2015-10-28 NOTE — ED Provider Notes (Addendum)
CSN: [id number]     Arrival date & time 10/28/15  1638 History   First MD Initiated Contact with Patient 10/28/15 1654     Chief Complaint  Patient presents with  . Weakness  . Fever     (Consider location/radiation/quality/duration/timing/severity/associated sxs/prior Treatment) HPI Comments: The patient is an elderly 82 year old female, she has a history of congestive heart failure as well as a history of hypertension, COPD and intermittent renal insufficiency. She is on medications including Lasix, lisinopril, Imdur, Coreg. The family member reports that over the last couple of days she has not seem to be herself, these comments, in light of having some decreased humor, I have having some slight change to her mental status which is poorly described by family members. She is still able to perform her daily activities despite these reported changes. The patient has a complaint of mild shortness of breath but only after given an option of several items including shortness of breath that she pick shortness of breath.  Patient is a 82 y.o. female presenting with weakness and fever.  Weakness  Fever   Past Medical History  Diagnosis Date  . Hypertension   . Asthma   . CHF (congestive heart failure) ([place])   . COPD (chronic obstructive pulmonary disease) ([place])   . Colon cancer ([place])   . Hyperlipidemia   . Heart murmur   . Anginal pain ([place])   . Pulmonary embolism ([place])   . Pneumonia     "just once" (03/01/2015)  . Hypothyroidism   . History of blood transfusion     "related to anemia, this is my 1st" (03/01/2015)  . Arthritis     "comes and goes" (03/01/2015)  . Renal disorder   . Colostomy care ([place])   . Diabetes mellitus without complication ([place])     pt denies this hx on 03/01/2015   Past Surgical History  Procedure Laterality Date  . Cholecystectomy    . Colon surgery      cancer  . Fracture surgery    . Dilation and curettage of uterus    . Tubal ligation    . Cataract  extraction w/ intraocular lens  implant, bilateral Bilateral   . Ankle fracture surgery Left   . Abdominal hysterectomy    . Cardiac catheterization N/A 03/22/2015    Procedure: Right/Left Heart Cath and Coronary Angiography;  Surgeon: Leonie Man, MD;  Location: [facility];  Service: Cardiovascular;  Laterality: N/A;   Family History  Problem Relation Age of Onset  . CAD    . Hypertension    . Stroke    . Colon cancer Neg Hx   . GI Bleed Neg Hx    Social History  Substance Use Topics  . Smoking status: Never Smoker   . Smokeless tobacco: Never Used  . Alcohol Use: No   OB History    No data available     Review of Systems  Constitutional: Positive for fever.  Neurological: Positive for weakness.  All other systems reviewed and are negative.     Allergies  Review of patient's allergies indicates no known allergies.  Home Medications   Prior to Admission medications   Medication Sig Start Date End Date Taking? Authorizing Provider  acetaminophen (TYLENOL) 325 MG tablet Take 650 mg by mouth every 6 (six) hours as needed (pain).   Yes Historical Provider, MD  ADVAIR DISKUS 500-50 MCG/DOSE AEPB Inhale 1 puff into the lungs every 12 (twelve) hours. 12/28/14  Yes Historical Provider, MD  albuterol (PROVENTIL) (2.5 MG/3ML) 0.083% nebulizer solution Take 3 mLs (2.5 mg total) by nebulization every 4 (four) hours as needed for wheezing. 04/28/15  Yes Geradine Girt, DO  aspirin EC 81 MG tablet Take 1 tablet (81 mg total) by mouth every morning. 03/03/15  Yes Ripudeep K Rai, MD  atorvastatin (LIPITOR) 20 MG tablet Take 20 mg by mouth daily.    Yes Historical Provider, MD  carvedilol (COREG) 6.25 MG tablet Take 1 tablet (6.25 mg total) by mouth 2 (two) times daily with a meal. 03/04/15  Yes Geradine Girt, DO  feeding supplement, ENSURE ENLIVE, (ENSURE ENLIVE) LIQD Take 237 mLs by mouth 2 (two) times daily between meals. 03/02/15  Yes Ripudeep Krystal Eaton, MD  ferrous sulfate 325  (65 FE) MG tablet Take 325 mg by mouth every morning.   Yes Historical Provider, MD  HYDROcodone-acetaminophen (NORCO/VICODIN) 5-325 MG tablet Take 1-2 tablets by mouth every 4 (four) hours as needed for moderate pain. 07/25/15  Yes Theodis Blaze, MD  isosorbide mononitrate (IMDUR) 30 MG 24 hr tablet Take 1 tablet (30 mg total) by mouth daily. 03/24/15  Yes Nishant Dhungel, MD  lactobacilus acidophilus & bulgar (FLORANEX) TABS chewable tablet Take 1 tablet by mouth 3 (three) times daily with meals. 07/29/15  Yes Florencia Reasons, MD  levothyroxine (SYNTHROID, LEVOTHROID) 50 MCG tablet Take 50 mcg by mouth every morning.  01/16/15  Yes Historical Provider, MD  lisinopril (PRINIVIL,ZESTRIL) 20 MG tablet Take 20 mg by mouth daily.   Yes Historical Provider, MD  oxymetazoline (AFRIN) 0.05 % nasal spray Place 1 spray into both nostrils 2 (two) times daily as needed for congestion.   Yes Historical Provider, MD  Polyethyl Glycol-Propyl Glycol (SYSTANE OP) Place 1 drop into the left eye 2 (two) times daily.   Yes Historical Provider, MD  ranitidine (ZANTAC) 150 MG tablet Take 150 mg by mouth daily.  01/14/15  Yes Historical Provider, MD  sertraline (ZOLOFT) 25 MG tablet Take 25 mg by mouth daily.   Yes Historical Provider, MD  azithromycin (ZITHROMAX Z-PAK) 250 MG tablet Take 1 tablet (250 mg total) by mouth daily. 500mg  PO day 1, then 250mg  PO days 205 10/28/15   Noemi Chapel, MD  furosemide (LASIX) 40 MG tablet Take 1-1.5 tablets (40-60 mg total) by mouth as directed. Patient not taking: Reported on 10/28/2015 08/11/15   Erlene Quan, PA-C  potassium chloride (K-DUR) 10 MEQ tablet Take 1 tablet (10 mEq total) by mouth daily. 10/28/15   Noemi Chapel, MD   BP 163/93 mmHg  Pulse 105  Temp(Src) 100.3 F (37.9 C) (Oral)  Resp 21  SpO2 92% Physical Exam  Constitutional: She appears well-developed and well-nourished. No distress.  HENT:  Head: Normocephalic and atraumatic.  Mouth/Throat: Oropharynx is clear and moist.  No oropharyngeal exudate.  Eyes: Conjunctivae and EOM are normal. Pupils are equal, round, and reactive to light. Right eye exhibits no discharge. Left eye exhibits no discharge. No scleral icterus.  Neck: Normal range of motion. Neck supple. No JVD present. No thyromegaly present.  Cardiovascular: Normal rate, regular rhythm, normal heart sounds and intact distal pulses.  Exam reveals no gallop and no friction rub.   No murmur heard. Pulmonary/Chest: Effort normal. No respiratory distress. She has no wheezes. She has rales (Soft rales at the bases).  Abdominal: Soft. Bowel sounds are normal. She exhibits no distension and no mass. There is no tenderness.  Musculoskeletal: Normal range of motion. She  exhibits no edema or tenderness.  Lymphadenopathy:    She has no cervical adenopathy.  Neurological: She is alert. Coordination normal.  Neurologic exam:  Speech clear, pupils equal round reactive to light, extraocular movements intact  Normal peripheral visual fields Cranial nerves III through XII normal including no facial droop Follows commands, moves all extremities x4, normal strength to bilateral upper and lower extremities at all major muscle groups including grip Sensation normal to light touch and pinprick   Skin: Skin is warm and dry. No rash noted. No erythema.  Psychiatric: She has a normal mood and affect. Her behavior is normal.  Nursing note and vitals reviewed.   ED Course  Procedures (including critical care time) Labs Review Labs Reviewed  COMPREHENSIVE METABOLIC PANEL - Abnormal; Notable for the following:    Potassium 3.3 (*)    GFR calc non Af Amer 55 (*)    All other components within normal limits  TROPONIN I - Abnormal; Notable for the following:    Troponin I 0.04 (*)    All other components within normal limits  CBC WITH DIFFERENTIAL/PLATELET - Abnormal; Notable for the following:    RBC 3.48 (*)    Hemoglobin 9.5 (*)    HCT 31.3 (*)    RDW 17.2 (*)     All other components within normal limits  URINALYSIS, ROUTINE W REFLEX MICROSCOPIC (NOT AT [facility]) - Abnormal; Notable for the following:    APPearance CLOUDY (*)    Hgb urine dipstick SMALL (*)    Protein, ur 100 (*)    Leukocytes, UA SMALL (*)    All other components within normal limits  BRAIN NATRIURETIC PEPTIDE - Abnormal; Notable for the following:    B Natriuretic Peptide 1406.5 (*)    All other components within normal limits  URINE MICROSCOPIC-ADD ON - Abnormal; Notable for the following:    Squamous Epithelial / LPF 0-5 (*)    Bacteria, UA FEW (*)    Casts HYALINE CASTS (*)    All other components within normal limits  URINE CULTURE  CBG MONITORING, ED    Imaging Review Dg Chest Port 1 View  10/28/2015  CLINICAL DATA:  82 year old female with weakness and fever for 1 day. Upper and lower extremity edema. EXAM: PORTABLE CHEST 1 VIEW COMPARISON:  10/01/2015 prior exam FINDINGS: Upper limits normal heart size and left-sided pacemaker/ICD again noted. Mild interstitial opacities bilaterally may represent interstitial edema. Left lower lung consolidation/ atelectasis again noted. There is no evidence of pneumothorax. IMPRESSION: Mild interstitial opacities which may represent interstitial pulmonary edema. Continued left lower lobe consolidation/atelectasis. Electronically Signed   By: Margarette Canada M.D.   On: 10/28/2015 17:59   I have personally reviewed and evaluated these images and lab results as part of my medical decision-making.   EKG Interpretation   Date/Time:  Thursday October 28 2015 17:00:17 EST Ventricular Rate:  100 PR Interval:  180 QRS Duration: 170 QT Interval:  376 QTC Calculation: 485 R Axis:   123 Text Interpretation:  Atrial-sensed ventricular-paced rhythm No further  analysis attempted due to paced rhythm Baseline wander in lead(s) III aVL  since last tracing no significant change Confirmed by Angelino Rumery  MD, Ezra Denne  [id number]) on 10/28/2015 5:18:18 PM      MDM    Final diagnoses:  Interstitial edema  Hypokalemia    The patient is in no distress, she does have compression stockings on her legs, there is not appear to be any overt edema, she is not  in distress, able to speak, no respiratory work of breathing, soft abdomen, EKG is paced but mildly tachycardic. Check urinalysis, small amount of fluid, chest x-ray, repeat evaluation. The patient does not appear clinically ill  Labs reviewed - lasix given, pt appears stable throughout.  D/w family labs and results and f/u indications.  In addition to written d/c instructions, the pt was given verbal d/c instructions including the indications for return and expressed understanding to the instructions.  The pt had some wheezing prior to d/c - she improved with neb and had sat's of 96% on ra afterwards - then developed temp of 100.3, no new findings on xray, no leukocytosis - pt is comfortable and RR of 15 on my exam.    , Meds given in ED:  Medications  albuterol (PROVENTIL) (2.5 MG/3ML) 0.083% nebulizer solution (not administered)  0.9 %  sodium chloride infusion ( Intravenous Stopped 10/28/15 2145)  furosemide (LASIX) injection 40 mg (40 mg Intravenous Given 10/28/15 2052)  albuterol (PROVENTIL) (2.5 MG/3ML) 0.083% nebulizer solution 5 mg (5 mg Nebulization Given 10/28/15 2154)    New Prescriptions   AZITHROMYCIN (ZITHROMAX Z-PAK) 250 MG TABLET    Take 1 tablet (250 mg total) by mouth daily. 500mg  PO day 1, then 250mg  PO days 205   POTASSIUM CHLORIDE (K-DUR) 10 MEQ TABLET    Take 1 tablet (10 mEq total) by mouth daily.      Noemi Chapel, MD 10/28/15 2127  Noemi Chapel, MD 10/28/15 [phone]

## 2015-10-28 NOTE — Progress Notes (Signed)
CSW met with patient at bedside. Son was present. Patient confirms that she presents to [facility] due to weakness. Also, she confirms that she is from [place]. Son states that she has been living at the facility for 6 months.  Patient states that she usually is able to complete her own ADL's but staff sometimes assist. Patient denies falling within the past 6 months. Patient states that she feels as though she has a good support. Patient states upon discharge she would feel safe to return to [place].  Son states that although the patient has support from other family members, he is her primary support.  Patient states that she is retired.  Patient and son state that they do not have any questions at this time.  Son/[name] [name] [phone]  Willette Brace [phone] ED CSW 10/28/2015 8:05 PM

## 2015-10-28 NOTE — ED Notes (Signed)
Patient made aware a urine specimen is needed.  

## 2015-10-28 NOTE — ED Notes (Signed)
Notified MD regarding VS and temp.

## 2015-10-28 NOTE — Discharge Instructions (Signed)
Please have your doctor check you in 3 days - you will also need to have your potassium rechecked - call your doctor in the morning to inform them of your ER visit and the need for blood check in next 3 days  Albuterol every 4 hours as needed for cough / wheezing or difficulty breathing.  Please obtain all of your results from medical records or have your doctors office obtain the results - share them with your doctor - you should be seen at your doctors office in the next 2 days. Call today to arrange your follow up. Take the medications as prescribed. Please review all of the medicines and only take them if you do not have an allergy to them. Please be aware that if you are taking birth control pills, taking other prescriptions, ESPECIALLY ANTIBIOTICS may make the birth control ineffective - if this is the case, either do not engage in sexual activity or use alternative methods of birth control such as condoms until you have finished the medicine and your family doctor says it is OK to restart them. If you are on a blood thinner such as COUMADIN, be aware that any other medicine that you take may cause the coumadin to either work too much, or not enough - you should have your coumadin level rechecked in next 7 days if this is the case.  ?  It is also a possibility that you have an allergic reaction to any of the medicines that you have been prescribed - Everybody reacts differently to medications and while MOST people have no trouble with most medicines, you may have a reaction such as nausea, vomiting, rash, swelling, shortness of breath. If this is the case, please stop taking the medicine immediately and contact your physician.  ?  You should return to the ER if you develop severe or worsening symptoms.

## 2015-10-28 NOTE — ED Notes (Signed)
Bed: [id number] Expected date: 10/28/15 Expected time:  Means of arrival:  Comments: EMS: 81yo weakness, ams

## 2015-10-28 NOTE — ED Notes (Signed)
MD at bedside. 

## 2015-10-28 NOTE — ED Notes (Signed)
Patient presents via EMS from [facility] for generalized weakness x1 day and fever. Neuro intact. Extremity edema in upper and lower.    154/79, 90hr, 98%RA, Cbg 117

## 2015-10-29 NOTE — ED Notes (Addendum)
Report called to facility. PTAR in route to [facility]

## 2015-11-01 LAB — URINE CULTURE

## 2015-11-02 ENCOUNTER — Telehealth (HOSPITAL_COMMUNITY): Payer: Self-pay

## 2015-11-02 NOTE — Telephone Encounter (Signed)
Post ED Visit - Positive Culture Follow-up  Culture report reviewed by antimicrobial stewardship pharmacist:  []  Elenor Quinones, Pharm.D. []  Heide Guile, Pharm.D., BCPS []  Parks Neptune, Pharm.D. []  Alycia Rossetti, Pharm.D., BCPS []  [place], [place].D., BCPS, AAHIVP []  Legrand Como, Pharm.D., BCPS, AAHIVP []  Milus Glazier, Pharm.D. [x]  Crist Fat, Pharm.D.  Positive urine culture Treated with azithromycin, organism sensitive to the same and no further patient follow-up is required at this time.  Ileene Musa 11/02/2015, 2:09 PM

## 2015-11-08 ENCOUNTER — Ambulatory Visit: Payer: Medicare HMO | Admitting: Cardiovascular Disease

## 2015-11-15 ENCOUNTER — Emergency Department (HOSPITAL_COMMUNITY): Payer: Medicare HMO

## 2015-11-15 ENCOUNTER — Inpatient Hospital Stay (HOSPITAL_COMMUNITY)
Admission: EM | Admit: 2015-11-15 | Discharge: 2015-11-19 | DRG: 291 | Disposition: A | Discharge status: Home Health | Payer: Medicare HMO | Attending: Internal Medicine | Admitting: Internal Medicine

## 2015-11-15 ENCOUNTER — Encounter (HOSPITAL_COMMUNITY): Payer: Self-pay | Admitting: *Deleted

## 2015-11-15 DIAGNOSIS — I5043 Acute on chronic combined systolic (congestive) and diastolic (congestive) heart failure: Secondary | ICD-10-CM | POA: Diagnosis present

## 2015-11-15 DIAGNOSIS — E785 Hyperlipidemia, unspecified: Secondary | ICD-10-CM | POA: Diagnosis present

## 2015-11-15 DIAGNOSIS — Z79899 Other long term (current) drug therapy: Secondary | ICD-10-CM | POA: Diagnosis not present

## 2015-11-15 DIAGNOSIS — Z7982 Long term (current) use of aspirin: Secondary | ICD-10-CM | POA: Diagnosis not present

## 2015-11-15 DIAGNOSIS — J45909 Unspecified asthma, uncomplicated: Secondary | ICD-10-CM | POA: Diagnosis present

## 2015-11-15 DIAGNOSIS — J9600 Acute respiratory failure, unspecified whether with hypoxia or hypercapnia: Secondary | ICD-10-CM | POA: Diagnosis present

## 2015-11-15 DIAGNOSIS — I130 Hypertensive heart and chronic kidney disease with heart failure and stage 1 through stage 4 chronic kidney disease, or unspecified chronic kidney disease: Secondary | ICD-10-CM | POA: Diagnosis present

## 2015-11-15 DIAGNOSIS — I509 Heart failure, unspecified: Secondary | ICD-10-CM | POA: Diagnosis not present

## 2015-11-15 DIAGNOSIS — E1122 Type 2 diabetes mellitus with diabetic chronic kidney disease: Secondary | ICD-10-CM | POA: Diagnosis present

## 2015-11-15 DIAGNOSIS — D649 Anemia, unspecified: Secondary | ICD-10-CM | POA: Diagnosis present

## 2015-11-15 DIAGNOSIS — Z85038 Personal history of other malignant neoplasm of large intestine: Secondary | ICD-10-CM | POA: Diagnosis not present

## 2015-11-15 DIAGNOSIS — D638 Anemia in other chronic diseases classified elsewhere: Secondary | ICD-10-CM | POA: Diagnosis present

## 2015-11-15 DIAGNOSIS — Z933 Colostomy status: Secondary | ICD-10-CM

## 2015-11-15 DIAGNOSIS — E039 Hypothyroidism, unspecified: Secondary | ICD-10-CM | POA: Diagnosis present

## 2015-11-15 DIAGNOSIS — E038 Other specified hypothyroidism: Secondary | ICD-10-CM | POA: Diagnosis not present

## 2015-11-15 DIAGNOSIS — J449 Chronic obstructive pulmonary disease, unspecified: Secondary | ICD-10-CM | POA: Diagnosis present

## 2015-11-15 DIAGNOSIS — I10 Essential (primary) hypertension: Secondary | ICD-10-CM | POA: Diagnosis present

## 2015-11-15 DIAGNOSIS — Z95810 Presence of automatic (implantable) cardiac defibrillator: Secondary | ICD-10-CM | POA: Diagnosis not present

## 2015-11-15 DIAGNOSIS — Z9119 Patient's noncompliance with other medical treatment and regimen: Secondary | ICD-10-CM

## 2015-11-15 DIAGNOSIS — I5023 Acute on chronic systolic (congestive) heart failure: Secondary | ICD-10-CM | POA: Diagnosis present

## 2015-11-15 DIAGNOSIS — Z7951 Long term (current) use of inhaled steroids: Secondary | ICD-10-CM | POA: Diagnosis not present

## 2015-11-15 DIAGNOSIS — N184 Chronic kidney disease, stage 4 (severe): Secondary | ICD-10-CM | POA: Diagnosis present

## 2015-11-15 DIAGNOSIS — I5021 Acute systolic (congestive) heart failure: Secondary | ICD-10-CM | POA: Diagnosis not present

## 2015-11-15 DIAGNOSIS — R0602 Shortness of breath: Secondary | ICD-10-CM

## 2015-11-15 DIAGNOSIS — Z86711 Personal history of pulmonary embolism: Secondary | ICD-10-CM

## 2015-11-15 DIAGNOSIS — J438 Other emphysema: Secondary | ICD-10-CM | POA: Diagnosis not present

## 2015-11-15 DIAGNOSIS — F0390 Unspecified dementia without behavioral disturbance: Secondary | ICD-10-CM | POA: Diagnosis present

## 2015-11-15 LAB — CBC
HEMATOCRIT: 30.1 % — AB (ref 36.0–46.0)
HEMOGLOBIN: 9.3 g/dL — AB (ref 12.0–15.0)
MCH: 27.6 pg (ref 26.0–34.0)
MCHC: 30.9 g/dL (ref 30.0–36.0)
MCV: 89.3 fL (ref 78.0–100.0)
Platelets: 438 10*3/uL — ABNORMAL HIGH (ref 150–400)
RBC: 3.37 MIL/uL — AB (ref 3.87–5.11)
RDW: 17.4 % — AB (ref 11.5–15.5)
WBC: 6.6 10*3/uL (ref 4.0–10.5)

## 2015-11-15 LAB — I-STAT ARTERIAL BLOOD GAS, ED
ACID-BASE DEFICIT: 5 mmol/L — AB (ref 0.0–2.0)
Bicarbonate: 19.7 mEq/L — ABNORMAL LOW (ref 20.0–24.0)
O2 SAT: 99 %
TCO2: 21 mmol/L (ref 0–100)
pCO2 arterial: 33.2 mmHg — ABNORMAL LOW (ref 35.0–45.0)
pH, Arterial: 7.38 (ref 7.350–7.450)
pO2, Arterial: 162 mmHg — ABNORMAL HIGH (ref 80.0–100.0)

## 2015-11-15 LAB — BASIC METABOLIC PANEL
ANION GAP: 13 (ref 5–15)
BUN: 18 mg/dL (ref 6–20)
CHLORIDE: 106 mmol/L (ref 101–111)
CO2: 23 mmol/L (ref 22–32)
Calcium: 9.7 mg/dL (ref 8.9–10.3)
Creatinine, Ser: 1.04 mg/dL — ABNORMAL HIGH (ref 0.44–1.00)
GFR, EST AFRICAN AMERICAN: 58 mL/min — AB (ref >60)
GFR, EST NON AFRICAN AMERICAN: 50 mL/min — AB (ref >60)
Glucose, Bld: 93 mg/dL (ref 65–99)
POTASSIUM: 3.9 mmol/L (ref 3.5–5.1)
SODIUM: 142 mmol/L (ref 135–145)

## 2015-11-15 LAB — I-STAT TROPONIN, ED: Troponin i, poc: 0.02 ng/mL (ref 0.00–0.08)

## 2015-11-15 LAB — TROPONIN I: Troponin I: 0.05 ng/mL — ABNORMAL HIGH (ref <0.031)

## 2015-11-15 LAB — MRSA PCR SCREENING: MRSA BY PCR: NEGATIVE

## 2015-11-15 LAB — BRAIN NATRIURETIC PEPTIDE: B NATRIURETIC PEPTIDE 5: 1669.5 pg/mL — AB (ref 0.0–100.0)

## 2015-11-15 MED ORDER — HYDROCODONE-ACETAMINOPHEN 5-325 MG PO TABS: 1.0000 | ORAL_TABLET | ORAL | Status: DC | PRN

## 2015-11-15 MED ORDER — SERTRALINE HCL 50 MG PO TABS
25.0000 mg | ORAL_TABLET | Freq: Every day | ORAL | Status: DC
  Administered 2015-11-15 – 2015-11-19 (×5): 25 mg via ORAL
  Filled 2015-11-15 (×5): qty 1

## 2015-11-15 MED ORDER — IPRATROPIUM-ALBUTEROL 0.5-2.5 (3) MG/3ML IN SOLN
RESPIRATORY_TRACT | Status: AC
  Filled 2015-11-15: qty 3

## 2015-11-15 MED ORDER — MOMETASONE FURO-FORMOTEROL FUM 200-5 MCG/ACT IN AERO
2.0000 | INHALATION_SPRAY | Freq: Two times a day (BID) | RESPIRATORY_TRACT | Status: DC
  Administered 2015-11-16 – 2015-11-19 (×6): 2 via RESPIRATORY_TRACT
  Filled 2015-11-15: qty 8.8

## 2015-11-15 MED ORDER — FUROSEMIDE 10 MG/ML IJ SOLN: 40.0000 mg | Freq: Every day | INTRAMUSCULAR | Status: DC

## 2015-11-15 MED ORDER — IPRATROPIUM-ALBUTEROL 0.5-2.5 (3) MG/3ML IN SOLN
3.0000 mL | Freq: Two times a day (BID) | RESPIRATORY_TRACT | Status: DC
Start: 2015-11-15 — End: 2015-11-16
  Administered 2015-11-15 – 2015-11-16 (×2): 3 mL via RESPIRATORY_TRACT
  Filled 2015-11-15 (×2): qty 3

## 2015-11-15 MED ORDER — ASPIRIN EC 81 MG PO TBEC
81.0000 mg | DELAYED_RELEASE_TABLET | ORAL | Status: DC
  Administered 2015-11-15 – 2015-11-19 (×5): 81 mg via ORAL
  Filled 2015-11-15 (×6): qty 1

## 2015-11-15 MED ORDER — SODIUM CHLORIDE 0.9% FLUSH: 3.0000 mL | INTRAVENOUS | Status: DC | PRN

## 2015-11-15 MED ORDER — FERROUS SULFATE 325 (65 FE) MG PO TABS
325.0000 mg | ORAL_TABLET | Freq: Every day | ORAL | Status: DC
  Administered 2015-11-15 – 2015-11-19 (×5): 325 mg via ORAL
  Filled 2015-11-15 (×7): qty 1

## 2015-11-15 MED ORDER — ONDANSETRON HCL 4 MG/2ML IJ SOLN: 4.0000 mg | Freq: Four times a day (QID) | INTRAMUSCULAR | Status: DC | PRN

## 2015-11-15 MED ORDER — FUROSEMIDE 10 MG/ML IJ SOLN
40.0000 mg | Freq: Two times a day (BID) | INTRAMUSCULAR | Status: DC
  Administered 2015-11-15 – 2015-11-16 (×3): 40 mg via INTRAVENOUS
  Filled 2015-11-15 (×3): qty 4

## 2015-11-15 MED ORDER — FUROSEMIDE 10 MG/ML IJ SOLN
60.0000 mg | Freq: Once | INTRAMUSCULAR | Status: AC
  Administered 2015-11-15: 60 mg via INTRAVENOUS
  Filled 2015-11-15: qty 6

## 2015-11-15 MED ORDER — ATORVASTATIN CALCIUM 20 MG PO TABS
20.0000 mg | ORAL_TABLET | Freq: Every day | ORAL | Status: DC
  Administered 2015-11-15 – 2015-11-19 (×5): 20 mg via ORAL
  Filled 2015-11-15 (×5): qty 1

## 2015-11-15 MED ORDER — CETYLPYRIDINIUM CHLORIDE 0.05 % MT LIQD
7.0000 mL | Freq: Two times a day (BID) | OROMUCOSAL | Status: DC
  Administered 2015-11-15 – 2015-11-19 (×9): 7 mL via OROMUCOSAL

## 2015-11-15 MED ORDER — ALBUTEROL SULFATE (2.5 MG/3ML) 0.083% IN NEBU: 2.5000 mg | INHALATION_SOLUTION | RESPIRATORY_TRACT | Status: DC | PRN

## 2015-11-15 MED ORDER — IPRATROPIUM-ALBUTEROL 0.5-2.5 (3) MG/3ML IN SOLN
3.0000 mL | Freq: Once | RESPIRATORY_TRACT | Status: AC
  Administered 2015-11-15: 3 mL via RESPIRATORY_TRACT
  Filled 2015-11-15: qty 3

## 2015-11-15 MED ORDER — LISINOPRIL 20 MG PO TABS
20.0000 mg | ORAL_TABLET | Freq: Every day | ORAL | Status: DC
  Administered 2015-11-15 – 2015-11-19 (×5): 20 mg via ORAL
  Filled 2015-11-15 (×5): qty 1

## 2015-11-15 MED ORDER — CARVEDILOL 6.25 MG PO TABS
6.2500 mg | ORAL_TABLET | Freq: Two times a day (BID) | ORAL | Status: DC
  Administered 2015-11-15 – 2015-11-19 (×9): 6.25 mg via ORAL
  Filled 2015-11-15 (×9): qty 1

## 2015-11-15 MED ORDER — SODIUM CHLORIDE 0.9% FLUSH
3.0000 mL | Freq: Two times a day (BID) | INTRAVENOUS | Status: DC
  Administered 2015-11-15 – 2015-11-19 (×9): 3 mL via INTRAVENOUS

## 2015-11-15 MED ORDER — SODIUM CHLORIDE 0.9 % IV SOLN: 250.0000 mL | INTRAVENOUS | Status: DC | PRN

## 2015-11-15 MED ORDER — ACETAMINOPHEN 325 MG PO TABS
650.0000 mg | ORAL_TABLET | ORAL | Status: DC | PRN
  Administered 2015-11-15: 650 mg via ORAL
  Filled 2015-11-15: qty 2

## 2015-11-15 MED ORDER — OXYMETAZOLINE HCL 0.05 % NA SOLN: 1.0000 | Freq: Two times a day (BID) | NASAL | Status: DC | PRN

## 2015-11-15 MED ORDER — METHYLPREDNISOLONE SODIUM SUCC 125 MG IJ SOLR
80.0000 mg | Freq: Once | INTRAMUSCULAR | Status: AC
  Administered 2015-11-15: 80 mg via INTRAVENOUS
  Filled 2015-11-15: qty 2

## 2015-11-15 MED ORDER — SACCHAROMYCES BOULARDII 250 MG PO CAPS
250.0000 mg | ORAL_CAPSULE | Freq: Three times a day (TID) | ORAL | Status: DC
  Administered 2015-11-15 – 2015-11-19 (×14): 250 mg via ORAL
  Filled 2015-11-15 (×18): qty 1

## 2015-11-15 MED ORDER — ALBUTEROL SULFATE (2.5 MG/3ML) 0.083% IN NEBU
2.5000 mg | INHALATION_SOLUTION | RESPIRATORY_TRACT | Status: DC
  Administered 2015-11-15 (×2): 2.5 mg via RESPIRATORY_TRACT
  Filled 2015-11-15 (×2): qty 3

## 2015-11-15 MED ORDER — ENSURE ENLIVE PO LIQD
237.0000 mL | Freq: Two times a day (BID) | ORAL | Status: DC
  Administered 2015-11-15 – 2015-11-19 (×9): 237 mL via ORAL
  Filled 2015-11-15: qty 237

## 2015-11-15 MED ORDER — ENOXAPARIN SODIUM 40 MG/0.4ML ~~LOC~~ SOLN
40.0000 mg | SUBCUTANEOUS | Status: DC
  Administered 2015-11-15 – 2015-11-19 (×4): 40 mg via SUBCUTANEOUS
  Filled 2015-11-15 (×6): qty 0.4

## 2015-11-15 MED ORDER — LEVOTHYROXINE SODIUM 50 MCG PO TABS
50.0000 ug | ORAL_TABLET | Freq: Every day | ORAL | Status: DC
  Administered 2015-11-15 – 2015-11-19 (×5): 50 ug via ORAL
  Filled 2015-11-15 (×7): qty 1

## 2015-11-15 MED ORDER — POTASSIUM CHLORIDE CRYS ER 10 MEQ PO TBCR
10.0000 meq | EXTENDED_RELEASE_TABLET | Freq: Every day | ORAL | Status: DC
  Administered 2015-11-15 – 2015-11-19 (×5): 10 meq via ORAL
  Filled 2015-11-15 (×7): qty 1

## 2015-11-15 MED ORDER — ISOSORBIDE MONONITRATE ER 30 MG PO TB24
30.0000 mg | ORAL_TABLET | Freq: Every day | ORAL | Status: DC
  Administered 2015-11-15 – 2015-11-19 (×5): 30 mg via ORAL
  Filled 2015-11-15 (×6): qty 1

## 2015-11-15 NOTE — ED Notes (Signed)
Pt from [facility] facility c/o shortness of breath. EMS unable to obtain accurate reading for pulse ox. Pt has bilateral edema which is baseline per patient. Crackles heard bilaterally with diminshed wheezing noted to R side

## 2015-11-15 NOTE — ED Notes (Signed)
Called to room by patient, pt diaphoretic, appears in more distress than previously assessed, unable to speak in full sentences. Dr.Horton made aware, verbal order for duoneb given. RT aware.

## 2015-11-15 NOTE — Progress Notes (Signed)
Pt up to chair with PT. PT sats 98-99% without any 02 on. Pt resting comfortably

## 2015-11-15 NOTE — H&P (Signed)
History and Physical  ROYCE PAWLOWICZ [id number] DOB: 1934/09/18 DOA: 11/15/2015  PCP: Reymundo Poll, MD   Chief Complaint: Dyspnea   History of Present Illness:  Patient is a 82 yo female with history of CHF, CKD, HTN, asthma, COPP who was brought here by EMS from assisted living facility dye to dyspnea that has been going on for about a week per patient but worsened today, associated with LE edema for over a week. Patient is a poor historian, partially due to difficulty breathing. She denies fever/chills, cough, N/V/D/C. She denies chest pain or any pain.   Review of Systems:  CONSTITUTIONAL:   No fever or chills. Eyes:   No eye discharge.   RESPIRATORY:  No cough.  +wheeze.  No hemoptysis.  +shortness of breath. CARDIOVASCULAR:  No chest pains.  No palpitations. GASTROINTESTINAL:  No abdominal pain.  No nausea or vomiting.  No diarrhea or constipation.  No hematemesis.  No hematochezia.  No melena. GENITOURINARY:  No urgency.  No frequency.  No dysuria.  No hematuria.  No obstructive symptoms.  No discharge.  No pain.  No significant abnormal bleeding. MUSCULOSKELETAL:  No musculoskeletal pain.  No joint swelling.  No arthritis. NEUROLOGICAL:  No confusion.  No weakness. No headache. No seizure. PSYCHIATRIC:  No depression. No anxiety. No suicidal ideation. SKIN:  No rashes.  No lesions.  No wounds. ENDOCRINE:  No unexplained weight loss.  No polydipsia.  No polyuria.  No polyphagia. HEMATOLOGIC:  No anemia.  No purpura.  No petechiae.  No bleeding.  ALLERGIC AND IMMUNOLOGIC:  No pruritus.  No swelling Other:  Past Medical and Surgical History:   Past Medical History  Diagnosis Date  . Hypertension   . Asthma   . CHF (congestive heart failure) ([place])   . COPD (chronic obstructive pulmonary disease) ([place])   . Colon cancer ([place])   . Hyperlipidemia   . Heart murmur   . Anginal pain ([place])   . Pulmonary embolism ([place])   . Pneumonia     "just once" (03/01/2015)  .  Hypothyroidism   . History of blood transfusion     "related to anemia, this is my 1st" (03/01/2015)  . Arthritis     "comes and goes" (03/01/2015)  . Renal disorder   . Colostomy care ([place])   . Diabetes mellitus without complication ([place])     pt denies this hx on 03/01/2015   Past Surgical History  Procedure Laterality Date  . Cholecystectomy    . Colon surgery      cancer  . Fracture surgery    . Dilation and curettage of uterus    . Tubal ligation    . Cataract extraction w/ intraocular lens  implant, bilateral Bilateral   . Ankle fracture surgery Left   . Abdominal hysterectomy    . Cardiac catheterization N/A 03/22/2015    Procedure: Right/Left Heart Cath and Coronary Angiography;  Surgeon: Leonie Man, MD;  Location: [facility];  Service: Cardiovascular;  Laterality: N/A;    Social History:   reports that she has never smoked. She has never used smokeless tobacco. She reports that she does not drink alcohol or use illicit drugs.  No Known Allergies  Family History  Problem Relation Age of Onset  . CAD    . Hypertension    . Stroke    . Colon cancer Neg Hx   . GI Bleed Neg Hx       Prior to Admission medications  Medication Sig Start Date End Date Taking? Authorizing Provider  acetaminophen (TYLENOL) 325 MG tablet Take 650 mg by mouth every 6 (six) hours as needed (pain).   Yes Historical Provider, MD  ADVAIR DISKUS 500-50 MCG/DOSE AEPB Inhale 1 puff into the lungs every 12 (twelve) hours. 12/28/14  Yes Historical Provider, MD  albuterol (PROVENTIL) (2.5 MG/3ML) 0.083% nebulizer solution Take 3 mLs (2.5 mg total) by nebulization every 4 (four) hours as needed for wheezing. 04/28/15  Yes Geradine Girt, DO  aspirin EC 81 MG tablet Take 1 tablet (81 mg total) by mouth every morning. 03/03/15  Yes Ripudeep K Rai, MD  atorvastatin (LIPITOR) 20 MG tablet Take 20 mg by mouth daily.    Yes Historical Provider, MD  carvedilol (COREG) 6.25 MG tablet Take 1 tablet  (6.25 mg total) by mouth 2 (two) times daily with a meal. 03/04/15  Yes Geradine Girt, DO  feeding supplement, ENSURE ENLIVE, (ENSURE ENLIVE) LIQD Take 237 mLs by mouth 2 (two) times daily between meals. 03/02/15  Yes Ripudeep Krystal Eaton, MD  ferrous sulfate 325 (65 FE) MG tablet Take 325 mg by mouth every morning.   Yes Historical Provider, MD  HYDROcodone-acetaminophen (NORCO/VICODIN) 5-325 MG tablet Take 1-2 tablets by mouth every 4 (four) hours as needed for moderate pain. 07/25/15  Yes Theodis Blaze, MD  isosorbide mononitrate (IMDUR) 30 MG 24 hr tablet Take 1 tablet (30 mg total) by mouth daily. 03/24/15  Yes Nishant Dhungel, MD  lactobacilus acidophilus & bulgar (FLORANEX) TABS chewable tablet Take 1 tablet by mouth 3 (three) times daily with meals. 07/29/15  Yes Florencia Reasons, MD  levothyroxine (SYNTHROID, LEVOTHROID) 50 MCG tablet Take 50 mcg by mouth every morning.  01/16/15  Yes Historical Provider, MD  lisinopril (PRINIVIL,ZESTRIL) 20 MG tablet Take 20 mg by mouth daily.   Yes Historical Provider, MD  oxymetazoline (AFRIN) 0.05 % nasal spray Place 1 spray into both nostrils 2 (two) times daily as needed for congestion.   Yes Historical Provider, MD  Polyethyl Glycol-Propyl Glycol (SYSTANE OP) Place 1 drop into the left eye 2 (two) times daily.   Yes Historical Provider, MD  potassium chloride (K-DUR) 10 MEQ tablet Take 1 tablet (10 mEq total) by mouth daily. 10/28/15  Yes Noemi Chapel, MD  ranitidine (ZANTAC) 150 MG tablet Take 150 mg by mouth daily.  01/14/15  Yes Historical Provider, MD  sertraline (ZOLOFT) 25 MG tablet Take 25 mg by mouth daily.   Yes Historical Provider, MD  furosemide (LASIX) 40 MG tablet Take 1-1.5 tablets (40-60 mg total) by mouth as directed. Patient not taking: Reported on 10/28/2015 08/11/15   Erlene Quan, PA-C    Physical Exam: BP 151/98 mmHg  Pulse 87  Temp(Src) 98 F (36.7 C)  Resp 20  SpO2 100%  GENERAL : appears in mild acute distress.  HEAD: normocephalic. EYES:  PERRL, EOMI. NOSE: No nasal discharge. NECK: Neck supple CARDIAC: Normal S1 and S2. No S3, S4 or murmurs. Rhythm is regular. There is +2 peripheral edema,. LUNGS: bilateral crackles and wheezing.  ABDOMEN: Positive bowel sounds. Soft, nondistended, nontender. No guarding or rebound. No masses. NEUROLOGICAL: The mental examination revealed the patient was oriented to person, place, and time.CN II-XII intact SKIN: Skin normal color PSYCHIATRIC:  The patient was able to demonstrate good judgement and reason, without hallucinations, abnormal affect or abnormal behaviors during the examination. Patient is not suicidal.          Labs on Admission:  Reviewed.  Radiological Exams on Admission: Dg Chest Port 1 View  11/15/2015  CLINICAL DATA:  82 year old female with shortness of breath and leg swelling. COPD. EXAM: PORTABLE CHEST 1 VIEW COMPARISON:  Radiograph dated 10/28/2015 FINDINGS: Stable cardiomegaly. There has been interval increase in the central vascular and interstitial prominence compatible with congestive changes or edema. Superimposed pneumonia is not excluded. No significant pleural effusion or pneumothorax. Left pectoral AICD. Degenerative changes of the spine. IMPRESSION: Cardiomegaly with congestive changes, worsened compared to the prior study. Superimposed pneumonia is not excluded. Clinical correlation is recommended. Electronically Signed   By: Anner Crete M.D.   On: 11/15/2015 02:19    EKG:  Independently reviewed. No acute changes comparing to prior EKG  Assessment/Plan  Acute systolic HF, acute on chronic: Likely due to non compliance  Place on BiPAP in the ER Given lasix 60 mg IV once Will start on lasix 40 mg IV daily Daily weights, I/Os Will r/o ACS> EKG no new ischemic changes comparing to prior EKG.  Admit to step down / keep on tele Continue ACEI/BB Continue asp/statin  Recent LHC with minimal CAD  Asthma/COPD:  Keep on albuterol Q4H Continue home  meds  HTN: cont home meds  CKD: Cr at baseline, will monitor electrolytes.    DVT prophylaxis: [place] enoxaparin  Code Status: Full       Gennaro Africa M.D [facility]

## 2015-11-15 NOTE — NC FL2 (Signed)
[place] MEDICAID FL2 LEVEL OF CARE SCREENING TOOL     IDENTIFICATION  Patient Name: [name] Birthdate: 1933-11-17 Sex: female Admission Date (Current Location): 11/15/2015  [facility] and [place] Number:  Herbalist and Address:  [facility], [place] [address] [place], [place] [address]      Provider Number: [id number]  Attending Physician Name and Address:  Geradine Girt, DO  Relative Name and Phone Number:       Current Level of Care: Hospital Recommended Level of Care: [place] Prior Approval Number:    Date Approved/Denied:   PASRR Number: [id number]  Discharge Plan: SNF    Current Diagnoses: Patient Active Problem List   Diagnosis Date Noted  . CHF (congestive heart failure) ([place]) 11/15/2015  . Heart failure ([place]) 11/15/2015  . Hypertensive heart disease 10/04/2015  . Elevated troponin 10/04/2015  . Acute respiratory failure ([place])   . Acute systolic congestive heart failure ([place])   . NICM (nonischemic cardiomyopathy) ([place]) 08/11/2015  . Normal coronary arteries 08/11/2015  . Dyslipidemia 07/27/2015  . CKD (chronic kidney disease) stage 3, GFR 30-59 ml/min 07/27/2015  . Anemia of chronic disease 07/27/2015  . Benign essential HTN 07/27/2015  . Hypothyroidism, adult 07/27/2015  . FUO (fever of unknown origin) 07/27/2015  . IDA (iron deficiency anemia) 07/27/2015  . Chronic systolic CHF (congestive heart failure) ([place]) 07/27/2015  . Colon cancer ([place]) 07/27/2015  . Sepsis ([place]) 07/24/2015  . Acute encephalopathy 07/24/2015  . Hypokalemia 07/24/2015  . Dementia 07/23/2015  . COPD (chronic obstructive pulmonary disease) ([place]) 04/25/2015  . ICD in place 04/14/2015  . Colostomy in place [facility]) 03/01/2015    Orientation RESPIRATION BLADDER Height & Weight     Self, Time, Situation, Place  O2 (2L) Incontinent Weight: 170 lb 14.4 oz (77.52 kg) Height:  5\' 6"  (167.6 cm)  BEHAVIORAL SYMPTOMS/MOOD  NEUROLOGICAL BOWEL NUTRITION STATUS      Colostomy (Patient manages herself) Diet (Heart Healthy with Thin Liquids)  AMBULATORY STATUS COMMUNICATION OF NEEDS Skin   Limited Assist Verbally Normal                       Personal Care Assistance Level of Assistance  Bathing, Feeding, Dressing Bathing Assistance: Limited assistance Feeding assistance: Independent Dressing Assistance: Limited assistance     Functional Limitations Info  Sight, Hearing, Speech Sight Info: Adequate Hearing Info: Adequate Speech Info: Adequate    SPECIAL CARE FACTORS FREQUENCY  PT (By licensed PT)     PT Frequency: 3              Contractures Contractures Info: Not present    Additional Factors Info  Code Status, Allergies Code Status Info: Full Code Allergies Info: No Known Allergies           Current Medications (11/15/2015):  This is the current hospital active medication list Current Facility-Administered Medications  Medication Dose Route Frequency Provider Last Rate Last Dose  . 0.9 %  sodium chloride infusion  250 mL Intravenous PRN Gennaro Africa, MD      . acetaminophen (TYLENOL) tablet 650 mg  650 mg Oral Q4H PRN Gennaro Africa, MD   650 mg at 11/15/15 1035  . albuterol (PROVENTIL) (2.5 MG/3ML) 0.083% nebulizer solution 2.5 mg  2.5 mg Nebulization Q4H PRN Geradine Girt, DO      . antiseptic oral rinse (CPC / CETYLPYRIDINIUM CHLORIDE 0.05%) solution 7 mL  7 mL Mouth Rinse BID  Geradine Girt, DO   7 mL at 11/15/15 1045  . aspirin EC tablet 81 mg  81 mg Oral Marya Fossa, MD   81 mg at 11/15/15 1035  . atorvastatin (LIPITOR) tablet 20 mg  20 mg Oral Daily Gennaro Africa, MD   20 mg at 11/15/15 1036  . carvedilol (COREG) tablet 6.25 mg  6.25 mg Oral BID WC Gennaro Africa, MD   6.25 mg at 11/15/15 1704  . enoxaparin (LOVENOX) injection 40 mg  40 mg Subcutaneous Q24H Gennaro Africa, MD   40 mg at 11/15/15 1037  . feeding supplement (ENSURE ENLIVE) (ENSURE ENLIVE) liquid 237 mL  237 mL Oral  BID BM Gennaro Africa, MD   237 mL at 11/15/15 1520  . ferrous sulfate tablet 325 mg  325 mg Oral Q breakfast Gennaro Africa, MD   325 mg at 11/15/15 1037  . furosemide (LASIX) injection 40 mg  40 mg Intravenous BID Geradine Girt, DO   40 mg at 11/15/15 1704  . HYDROcodone-acetaminophen (NORCO/VICODIN) 5-325 MG per tablet 1-2 tablet  1-2 tablet Oral Q4H PRN Gennaro Africa, MD      . ipratropium-albuterol (DUONEB) 0.5-2.5 (3) MG/3ML nebulizer solution 3 mL  3 mL Nebulization BID Geradine Girt, DO      . isosorbide mononitrate (IMDUR) 24 hr tablet 30 mg  30 mg Oral Daily Gennaro Africa, MD   30 mg at 11/15/15 1036  . levothyroxine (SYNTHROID, LEVOTHROID) tablet 50 mcg  50 mcg Oral QAC breakfast Gennaro Africa, MD   50 mcg at 11/15/15 1036  . lisinopril (PRINIVIL,ZESTRIL) tablet 20 mg  20 mg Oral Daily Gennaro Africa, MD   20 mg at 11/15/15 1035  . mometasone-formoterol (DULERA) 200-5 MCG/ACT inhaler 2 puff  2 puff Inhalation BID Gennaro Africa, MD   2 puff at 11/15/15 0741  . ondansetron (ZOFRAN) injection 4 mg  4 mg Intravenous Q6H PRN Gennaro Africa, MD      . oxymetazoline (AFRIN) 0.05 % nasal spray 1 spray  1 spray Each Nare BID PRN Gennaro Africa, MD      . potassium chloride (K-DUR) CR tablet 10 mEq  10 mEq Oral Daily Gennaro Africa, MD   10 mEq at 11/15/15 1036  . saccharomyces boulardii (FLORASTOR) capsule 250 mg  250 mg Oral TID WC Gennaro Africa, MD   250 mg at 11/15/15 1704  . sertraline (ZOLOFT) tablet 25 mg  25 mg Oral Daily Gennaro Africa, MD   25 mg at 11/15/15 1036  . sodium chloride flush (NS) 0.9 % injection 3 mL  3 mL Intravenous Q12H Gennaro Africa, MD   3 mL at 11/15/15 1038  . sodium chloride flush (NS) 0.9 % injection 3 mL  3 mL Intravenous PRN Gennaro Africa, MD         Discharge Medications: Please see discharge summary for a list of discharge medications.  Relevant Imaging Results:  Relevant Lab Results:   Additional Information SSN [id number]  Barbette Or, [place]

## 2015-11-15 NOTE — ED Notes (Signed)
Pt tolerating bi-pap, respirations even and unlabored. [name], son at bedside, to be reach if needed.

## 2015-11-15 NOTE — ED Notes (Signed)
Pt placed on 2L [place], reports shortness of breath is better with oxygen

## 2015-11-15 NOTE — ED Provider Notes (Signed)
CSN: [id number]     Arrival date & time 11/15/15  0035 History  By signing my name below, I, [name], attest that this documentation has been prepared under the direction and in the presence of Shelby Hacker, MD. Electronically Signed: Stephania [name], ED Scribe. 11/15/2015. 3:54 AM.   Chief Complaint  Patient presents with  . Shortness of Breath   The history is provided by the patient. No language interpreter was used.    HPI Comments: [name] is a 82 y.o. female with a history of CHF,  stage IV CKD, HTN, asthma, COPD, HLD, and DM, brought in by ambulance  From [place], who presents to the Emergency Department complaining of gradual-onset, constant, SOB that began today. Patient notes leg swelling that has been intermittent for 2-3 weeks, but which is at baseline. She denies any cough, fever, or chest pain.  Denies any other associated symptoms.  Past Medical History  Diagnosis Date  . Hypertension   . Asthma   . CHF (congestive heart failure) ([place])   . COPD (chronic obstructive pulmonary disease) ([place])   . Colon cancer ([place])   . Hyperlipidemia   . Heart murmur   . Anginal pain ([place])   . Pulmonary embolism ([place])   . Pneumonia     "just once" (03/01/2015)  . Hypothyroidism   . History of blood transfusion     "related to anemia, this is my 1st" (03/01/2015)  . Arthritis     "comes and goes" (03/01/2015)  . Renal disorder   . Colostomy care ([place])   . Diabetes mellitus without complication ([place])     pt denies this hx on 03/01/2015   Past Surgical History  Procedure Laterality Date  . Cholecystectomy    . Colon surgery      cancer  . Fracture surgery    . Dilation and curettage of uterus    . Tubal ligation    . Cataract extraction w/ intraocular lens  implant, bilateral Bilateral   . Ankle fracture surgery Left   . Abdominal hysterectomy    . Cardiac catheterization N/A 03/22/2015    Procedure: Right/Left Heart Cath and Coronary Angiography;   Surgeon: Leonie Man, MD;  Location: [facility];  Service: Cardiovascular;  Laterality: N/A;   Family History  Problem Relation Age of Onset  . CAD    . Hypertension    . Stroke    . Colon cancer Neg Hx   . GI Bleed Neg Hx    Social History  Substance Use Topics  . Smoking status: Never Smoker   . Smokeless tobacco: Never Used  . Alcohol Use: No   OB History    No data available     Review of Systems  Constitutional: Negative for fever.  Respiratory: Positive for shortness of breath. Negative for cough.   Cardiovascular: Positive for leg swelling (baseline). Negative for chest pain.  Gastrointestinal: Positive for abdominal pain. Negative for nausea and vomiting.  All other systems reviewed and are negative.  Allergies  Review of patient's allergies indicates no known allergies.  Home Medications   Prior to Admission medications   Medication Sig Start Date End Date Taking? Authorizing Provider  acetaminophen (TYLENOL) 325 MG tablet Take 650 mg by mouth every 6 (six) hours as needed (pain).   Yes Historical Provider, MD  ADVAIR DISKUS 500-50 MCG/DOSE AEPB Inhale 1 puff into the lungs every 12 (twelve) hours. 12/28/14  Yes Historical Provider,  MD  albuterol (PROVENTIL) (2.5 MG/3ML) 0.083% nebulizer solution Take 3 mLs (2.5 mg total) by nebulization every 4 (four) hours as needed for wheezing. 04/28/15  Yes Geradine Girt, DO  aspirin EC 81 MG tablet Take 1 tablet (81 mg total) by mouth every morning. 03/03/15  Yes Ripudeep K Rai, MD  atorvastatin (LIPITOR) 20 MG tablet Take 20 mg by mouth daily.    Yes Historical Provider, MD  carvedilol (COREG) 6.25 MG tablet Take 1 tablet (6.25 mg total) by mouth 2 (two) times daily with a meal. 03/04/15  Yes Geradine Girt, DO  feeding supplement, ENSURE ENLIVE, (ENSURE ENLIVE) LIQD Take 237 mLs by mouth 2 (two) times daily between meals. 03/02/15  Yes Ripudeep Krystal Eaton, MD  ferrous sulfate 325 (65 FE) MG tablet Take 325 mg by mouth  every morning.   Yes Historical Provider, MD  HYDROcodone-acetaminophen (NORCO/VICODIN) 5-325 MG tablet Take 1-2 tablets by mouth every 4 (four) hours as needed for moderate pain. 07/25/15  Yes Theodis Blaze, MD  isosorbide mononitrate (IMDUR) 30 MG 24 hr tablet Take 1 tablet (30 mg total) by mouth daily. 03/24/15  Yes Nishant Dhungel, MD  lactobacilus acidophilus & bulgar (FLORANEX) TABS chewable tablet Take 1 tablet by mouth 3 (three) times daily with meals. 07/29/15  Yes Florencia Reasons, MD  levothyroxine (SYNTHROID, LEVOTHROID) 50 MCG tablet Take 50 mcg by mouth every morning.  01/16/15  Yes Historical Provider, MD  lisinopril (PRINIVIL,ZESTRIL) 20 MG tablet Take 20 mg by mouth daily.   Yes Historical Provider, MD  oxymetazoline (AFRIN) 0.05 % nasal spray Place 1 spray into both nostrils 2 (two) times daily as needed for congestion.   Yes Historical Provider, MD  Polyethyl Glycol-Propyl Glycol (SYSTANE OP) Place 1 drop into the left eye 2 (two) times daily.   Yes Historical Provider, MD  potassium chloride (K-DUR) 10 MEQ tablet Take 1 tablet (10 mEq total) by mouth daily. 10/28/15  Yes Noemi Chapel, MD  ranitidine (ZANTAC) 150 MG tablet Take 150 mg by mouth daily.  01/14/15  Yes Historical Provider, MD  sertraline (ZOLOFT) 25 MG tablet Take 25 mg by mouth daily.   Yes Historical Provider, MD  furosemide (LASIX) 40 MG tablet Take 1-1.5 tablets (40-60 mg total) by mouth as directed. Patient not taking: Reported on 10/28/2015 08/11/15   Doreene Burke Kilroy, PA-C   BP 170/87 mmHg  Pulse 100  Temp(Src) 98 F (36.7 C)  Resp 31  SpO2 100% Physical Exam  Constitutional: She is oriented to person, place, and time. No distress.  Chronically ill-appearing, no acute distress  HENT:  Head: Normocephalic and atraumatic.  Eyes: Pupils are equal, round, and reactive to light.  Neck: Neck supple.  Cardiovascular: Normal rate, regular rhythm and normal heart sounds.   Pulmonary/Chest: Effort normal. No respiratory distress.  She has wheezes.  Coarse breath sounds bilaterally, crackles bilateral lung bases, scant expiratory wheezing  Abdominal: Soft. Bowel sounds are normal. There is no tenderness. There is no rebound.  Musculoskeletal: Normal range of motion. She exhibits edema.  1+ bilateral lower extremity edema  Neurological: She is alert and oriented to person, place, and time.  Skin: Skin is warm and dry.  Psychiatric: She has a normal mood and affect. Her behavior is normal.  Nursing note and vitals reviewed.   ED Course  Procedures (including critical care time)  CRITICAL CARE Performed by: [name]   Total critical care time: 45 minutes  Critical care time was exclusive of  separately billable procedures and treating other patients.  Critical care was necessary to treat or prevent imminent or life-threatening deterioration.  Critical care was time spent personally by me on the following activities: development of treatment plan with patient and/or surrogate as well as nursing, discussions with consultants, evaluation of patient's response to treatment, examination of patient, obtaining history from patient or surrogate, ordering and performing treatments and interventions, ordering and review of laboratory studies, ordering and review of radiographic studies, pulse oximetry and re-evaluation of patient's condition.  DIAGNOSTIC STUDIES: Oxygen Saturation is 100% on RA, normal by my interpretation.    COORDINATION OF CARE: 1:10 AM - Discussed treatment plan with pt at bedside. Pt verbalized understanding and agreed to plan.   Labs Review Labs Reviewed  BASIC METABOLIC PANEL - Abnormal; Notable for the following:    Creatinine, Ser 1.04 (*)    GFR calc non Af Amer 50 (*)    GFR calc Af Amer 58 (*)    All other components within normal limits  CBC - Abnormal; Notable for the following:    RBC 3.37 (*)    Hemoglobin 9.3 (*)    HCT 30.1 (*)    RDW 17.4 (*)    Platelets 438 (*)     All other components within normal limits  BRAIN NATRIURETIC PEPTIDE - Abnormal; Notable for the following:    B Natriuretic Peptide 1669.5 (*)    All other components within normal limits  I-STAT ARTERIAL BLOOD GAS, ED - Abnormal; Notable for the following:    pCO2 arterial 33.2 (*)    pO2, Arterial 162.0 (*)    Bicarbonate 19.7 (*)    Acid-base deficit 5.0 (*)    All other components within normal limits  Randolm Idol, ED    Imaging Review Dg Chest Port 1 View  11/15/2015  CLINICAL DATA:  82 year old female with shortness of breath and leg swelling. COPD. EXAM: PORTABLE CHEST 1 VIEW COMPARISON:  Radiograph dated 10/28/2015 FINDINGS: Stable cardiomegaly. There has been interval increase in the central vascular and interstitial prominence compatible with congestive changes or edema. Superimposed pneumonia is not excluded. No significant pleural effusion or pneumothorax. Left pectoral AICD. Degenerative changes of the spine. IMPRESSION: Cardiomegaly with congestive changes, worsened compared to the prior study. Superimposed pneumonia is not excluded. Clinical correlation is recommended. Electronically Signed   By: Anner Crete M.D.   On: 11/15/2015 02:19   I have personally reviewed and evaluated these images and lab results as part of my medical decision-making.   EKG Interpretation   Date/Time:  Monday November 15 2015 00:50:08 EST Ventricular Rate:  102 PR Interval:  152 QRS Duration: 165 QT Interval:  390 QTC Calculation: 508 R Axis:   116 Text Interpretation:  Sinus or ectopic atrial tachycardia Nonspecific  intraventricular conduction delay Probable anterolateral infarct, acute No  dynamic changes Confirmed by HORTON  MD, COURTNEY ([id number]) on 11/15/2015  3:21:44 AM      MDM   Final diagnoses:  Acute respiratory failure, unspecified whether with hypoxia or hypercapnia (HCC)  Acute on chronic systolic heart failure ([place])   Patient presents with shortness of breath.  History of COPD, CHF, and pulmonary embolus. She is wheezing with crackles on exam. Does look somewhat volume overloaded. Similar presentation approximately 2 weeks ago. Patient was given Solu-Medrol and a DuoNeb. Given lower extreme a swelling, suspect element of CHF as well.  2:11 AM Acutely more short of breath. Patient with increased work of breathing and diaphoretic. Patient given  a DuoNeb. We'll place on BiPAP for work of breathing. ABG ordered.   BNP elevated. Chest x-ray with cardiomegaly and congestive changes worsened from prior. No leukocytosis and no fevers. Doubt pneumonia at this time. Troponin is negative (did not cross over in chart).  I-STAT troponin 0.02. Patient given 2 mg of IV Lasix.  3:58 AM Patient appears to be tolerating BiPAP well. Will admit for further management and diuresis.  Discussed with Dr. Dreama Saa.  I personally performed the services described in this documentation, which was scribed in my presence. The recorded information has been reviewed and is accurate.    Shelby Hacker, MD 11/15/15 [phone]

## 2015-11-15 NOTE — Plan of Care (Signed)
Problem: Skin Integrity: Goal: Risk for impaired skin integrity will decrease Outcome: Progressing Pt able to turn from side to side

## 2015-11-15 NOTE — ED Notes (Signed)
Admitting MD at bedside.

## 2015-11-15 NOTE — Progress Notes (Signed)
Patient requested to come off of BiPAP for few minutes, placed on 2LNC SATS 100%, RR 18 BPM, BBS clear-diminished, patient stated she is breathing fine on [facility], will continue to monitor.

## 2015-11-15 NOTE — ED Notes (Signed)
Pt placed on bipap, tolerating well

## 2015-11-15 NOTE — Progress Notes (Signed)
Patient admitted after midnight, please see H&P.  Patient from ALF-- recently admitted in December with CHF--- ? If another CHF exacerbation--- has not yet been weighed and I/Os not being recorded in ER. IV lasix, daily labs and monitor -? Need for palliative care meeting-- no family at bedside- full code-- not sure what baseline is  Shelby Bear DO

## 2015-11-15 NOTE — Progress Notes (Signed)
Pt up to chair with PT, back to bed with NT. Pt sats are 98% on room air. Pt denies SOB, and work of breathing has decreased significantly throughout the shift. VSS. Pt has appt with Caryl Comes tomorrow, MD paged to see if her device can be interrogated while inpatient

## 2015-11-15 NOTE — ED Notes (Signed)
Report given to [place], [place] on [address]

## 2015-11-15 NOTE — Evaluation (Signed)
Physical Therapy Evaluation Patient Details Name: [name] MRN: [id number] DOB: [birth year] Today's Date: 11/15/2015   History of Present Illness  Pt is a 82 y/o F admitted from her [facility] due to dyspnea associated w/ LE edema.  Pt's PMH includes CHF, COPD, colon cancer, anginal pain, PE, hypothyroidism, colostomy, ankle fx surgery.  Clinical Impression  Pt admitted with above diagnosis. Pt currently with functional limitations due to the deficits listed below (see PT Problem List). [name] is from an [place] where she receives assist w/ ADLs, transfers, and ambulation.  She currently requires min assist for safe transfer bed>chair and is unsteady.  Pt agitated and declines ambulation.  She will need SNF level of care at d/c as she currently needs 24/7 assist/supervision. Pt will benefit from skilled PT to increase their independence and safety with mobility to allow discharge to the venue listed below.      Follow Up Recommendations SNF;Supervision/Assistance - 24 hour    Equipment Recommendations  None recommended by PT    Recommendations for Other Services       Precautions / Restrictions Precautions Precautions: Fall Restrictions Weight Bearing Restrictions: No      Mobility  Bed Mobility Overal bed mobility: Needs Assistance Bed Mobility: Supine to Sit     Supine to sit: Min assist;HOB elevated     General bed mobility comments: Min assist to boost trunk up to sitting.  Pt uses bed rail and requires cues to scoot to EOB.    Transfers Overall transfer level: Needs assistance Equipment used: 1 person hand held assist Transfers: Sit to/from [facility] Sit to Stand: Min assist Stand pivot transfers: Min assist       General transfer comment: Hand held assist to steady and to boost up to standing.  Pt shuffles feet during pivot w/ flexed posture.  Ambulation/Gait                Stairs             Wheelchair Mobility    Modified Rankin (Stroke Patients Only)       Balance Overall balance assessment: Needs assistance (denies h/o falls in the past 6 months) Sitting-balance support: Bilateral upper extremity supported;Feet supported Sitting balance-Leahy Scale: Fair     Standing balance support: During functional activity;Single extremity supported Standing balance-Leahy Scale: Poor Standing balance comment: Requires assist for support                             Pertinent Vitals/Pain Pain Assessment: No/denies pain    Home Living Family/patient expects to be discharged to:: Skilled nursing facility                 Additional Comments: pt from ALF    Prior Function Level of Independence: Needs assistance   Gait / Transfers Assistance Needed: States she ambulates w/ assist w/ RW and she never gets OOB or chair w/o assist  ADL's / Homemaking Assistance Needed: Assist w/ bathing and dressing        Hand Dominance        Extremity/Trunk Assessment   Upper Extremity Assessment: Generalized weakness           Lower Extremity Assessment: Generalized weakness;RLE deficits/detail;LLE deficits/detail RLE Deficits / Details: strength grossly 3/5; edema Bil LEs LLE Deficits / Details: strength grossly 3/5; edema Bil LEs  Cervical / Trunk Assessment: Kyphotic  Communication   Communication:  No difficulties  Cognition Arousal/Alertness: Lethargic Behavior During Therapy: Agitated;Flat affect Overall Cognitive Status: No family/caregiver present to determine baseline cognitive functioning Area of Impairment: Orientation;Attention;Safety/judgement;Problem solving Orientation Level: Disoriented to;Time (reports the year as 2007) Current Attention Level: Selective     Safety/Judgement: Decreased awareness of safety;Decreased awareness of deficits   Problem Solving: Slow processing;Requires verbal cues General Comments: Unable to tell  therapist which ALF she is from    General Comments      Exercises General Exercises - Lower Extremity Ankle Circles/Pumps: AROM;Both;10 reps;Seated Long Arc Quad: AROM;Both;10 reps;Seated      Assessment/Plan    PT Assessment Patient needs continued PT services  PT Diagnosis Difficulty walking;Generalized weakness;Altered mental status   PT Problem List Decreased strength;Decreased activity tolerance;Decreased balance;Decreased mobility;Decreased cognition;Decreased knowledge of use of DME;Decreased safety awareness;Cardiopulmonary status limiting activity  PT Treatment Interventions DME instruction;Gait training;Functional mobility training;Therapeutic activities;Balance training;Therapeutic exercise;Neuromuscular re-education;Cognitive remediation;Patient/family education   PT Goals (Current goals can be found in the Care Plan section) Acute Rehab PT Goals Patient Stated Goal: none stated but pt is agreeable to SNF PT Goal Formulation: With patient Time For Goal Achievement: 11/29/15 Potential to Achieve Goals: Good    Frequency Min 2X/week   Barriers to discharge Decreased caregiver support PT needs 24/7 assist/supervision; unclear if she will have this in her current ALF setting    Co-evaluation               End of Session Equipment Utilized During Treatment: Gait belt;Oxygen Activity Tolerance: Treatment limited secondary to agitation Patient left: in chair;with call bell/phone within reach;with chair alarm set Nurse Communication: Mobility status         Time: [id number] PT Time Calculation (min) (ACUTE ONLY): 19 min   Charges:   PT Evaluation $PT Eval Moderate Complexity: 1 Procedure     PT G Codes:       Joslyn Hy PT, DPT [phone] Pager: [phone] 11/15/2015, 3:11 PM

## 2015-11-15 NOTE — Progress Notes (Addendum)
Pt SOB will obtain standing weight once pt is stable

## 2015-11-15 NOTE — Consult Note (Addendum)
WOC assistance requested for ostomy products. Pt states she has been independent with pouch application and emptying for many years and denies any problems; she does not have any supplies with her at the hospital. She uses a [employer] 2 piece pouching system with a clamp and a barrier ring.  Although we do not have this product in our formulary, we have a similar 2 piece system. Demonstrated to patient how the clamp can be used in between the velcro closure. Current pouch intact with a good seal and mod amt semi-formed brown stool. She denies need for further assistance or questions.  Extra supplies ordered to room for patient or staff nurse use. Please re-consult if further assistance is needed.  Thank-you,  Julien Girt MSN, [place], [place], [place], [place]

## 2015-11-15 NOTE — Clinical Social Work Note (Signed)
Clinical Social Work Assessment  Patient Details  Name: [name] MRN: [id number] Date of Birth: [birth year]  Date of referral:  11/15/15               Reason for consult:  Facility Placement                Permission sought to share information with:  Family Supports Permission granted to share information::  Yes, Verbal Permission Granted  Name::     [name]  Relationship::  Son  Contact Information:  [phone]  Housing/Transportation Living arrangements for the past 2 months:  [place] ([place]) Source of Information:  Adult Children Patient Interpreter Needed:  None Criminal Activity/Legal Involvement Pertinent to Current Situation/Hospitalization:  No - Comment as needed Significant Relationships:  Adult Children Lives with:  Facility Resident Do you feel safe going back to the place where you live?  Yes Need for family participation in patient care:  Yes (Comment)  Care giving concerns:  Patient son verbalizes concern regarding patient "check" and the ability to split between ALF and SNF.  Patient son ultimately agreed to SNF placement.   Social Worker assessment / plan:  Holiday representative spoke with patient son over the phone to offer support and discuss patient needs at discharge.  Patient son states that patient is a current resident at [facility] and has been to [facility] in the past.  Patient son requests that patient return to [facility] once medically stable.  CSW to initiate referral and left message with admissions coordinator regarding possible bed offer.  Facility to submit for insurance authorization from [place].  CSW remains available for support and to facilitate patient patient discharge needs once medically stable.  Employment status:  Retired Nurse, adult PT Recommendations:  [place] / Referral to community resources:  [place]  Patient/Family's Response to care:  Patient son agreeable with SNF placement and understanding of CSW role.  Patient/Family's Understanding of and Emotional Response to Diagnosis, Current Treatment, and Prognosis:  Patient son does not have a good concept of patient current needs and seems rather limited in his ability to comprehend patient current medical status.    Emotional Assessment Appearance:  Appears stated age Attitude/Demeanor/Rapport:  Inconsistent Affect (typically observed):  Calm Orientation:  Oriented to Self, Oriented to Place, Oriented to  Time, Oriented to Situation Alcohol / Substance use:  Not Applicable Psych involvement (Current and /or in the community):  No (Comment)  Discharge Needs  Concerns to be addressed:  Discharge Planning Concerns Readmission within the last 30 days:  No Current discharge risk:  Dependent with Mobility Barriers to Discharge:  Continued Medical Work up  [employer], [place]

## 2015-11-16 ENCOUNTER — Ambulatory Visit (HOSPITAL_COMMUNITY): Payer: Medicare HMO

## 2015-11-16 ENCOUNTER — Encounter: Payer: Medicare HMO | Admitting: Cardiovascular Disease

## 2015-11-16 DIAGNOSIS — I10 Essential (primary) hypertension: Secondary | ICD-10-CM | POA: Diagnosis present

## 2015-11-16 DIAGNOSIS — I509 Heart failure, unspecified: Secondary | ICD-10-CM

## 2015-11-16 DIAGNOSIS — E039 Hypothyroidism, unspecified: Secondary | ICD-10-CM | POA: Diagnosis present

## 2015-11-16 LAB — BASIC METABOLIC PANEL
ANION GAP: 14 (ref 5–15)
BUN: 29 mg/dL — ABNORMAL HIGH (ref 6–20)
CO2: 27 mmol/L (ref 22–32)
Calcium: 9.5 mg/dL (ref 8.9–10.3)
Chloride: 101 mmol/L (ref 101–111)
Creatinine, Ser: 1 mg/dL (ref 0.44–1.00)
GFR, EST NON AFRICAN AMERICAN: 52 mL/min — AB (ref >60)
Glucose, Bld: 84 mg/dL (ref 65–99)
POTASSIUM: 3.6 mmol/L (ref 3.5–5.1)
SODIUM: 142 mmol/L (ref 135–145)

## 2015-11-16 MED ORDER — IPRATROPIUM-ALBUTEROL 0.5-2.5 (3) MG/3ML IN SOLN
3.0000 mL | RESPIRATORY_TRACT | Status: DC | PRN
  Filled 2015-11-16: qty 3

## 2015-11-16 MED ORDER — FUROSEMIDE 40 MG PO TABS
40.0000 mg | ORAL_TABLET | Freq: Two times a day (BID) | ORAL | Status: DC
  Administered 2015-11-16 – 2015-11-17 (×2): 40 mg via ORAL
  Filled 2015-11-16 (×2): qty 1

## 2015-11-16 MED ORDER — POTASSIUM CHLORIDE CRYS ER 20 MEQ PO TBCR
40.0000 meq | EXTENDED_RELEASE_TABLET | Freq: Once | ORAL | Status: AC
  Administered 2015-11-16: 40 meq via ORAL
  Filled 2015-11-16: qty 2

## 2015-11-16 NOTE — Progress Notes (Signed)
  ROS  This encounter was created in error - please disregard. 

## 2015-11-16 NOTE — Progress Notes (Signed)
Notified from central tele that pts pacer spikers were randomly firing. Pt asymptomatic. HR 85. Obtaining a 12-lead EKG. Dr. Conley Canal made aware.

## 2015-11-16 NOTE — Evaluation (Signed)
Occupational Therapy Evaluation Patient Details Name: [name] MRN: [id number] DOB: 15-Apr-1934 Today's Date: 11/16/2015    History of Present Illness Pt is a 82 y/o F admitted from her [facility] due to dyspnea associated w/ LE edema.  Pt's PMH includes CHF, COPD, colon cancer, anginal pain, PE, hypothyroidism, colostomy, ankle fx surgery.   Clinical Impression   Pt reports having assist for shower transfer, LB bathing and dressing and assist to rise to standing.  She reports being able to ambulate with her walker once on her feet and to manage her colostomy bag.  Pt presents with generalized weakness and impaired balance interfering with ability to perform ADL and ADL transfers.  Pt will need ST rehab prior to return to ALF.   Will follow acutely.  Follow Up Recommendations  SNF;Supervision/Assistance - 24 hour    Equipment Recommendations       Recommendations for Other Services       Precautions / Restrictions Precautions Precautions: Fall Restrictions Weight Bearing Restrictions: No      Mobility Bed Mobility               General bed mobility comments: pt in chair  Transfers Overall transfer level: Needs assistance Equipment used: 1 person hand held assist Transfers: Sit to/from Stand;Stand Pivot Transfers Sit to Stand: Min assist Stand pivot transfers: Min assist       General transfer comment: assist to rise and steady as she shuffled feet to/from 3 in 1, pt uses pull ups at ALF due to incontinence.    Balance     Sitting balance-Leahy Scale: Fair       Standing balance-Leahy Scale: Poor                              ADL Overall ADL's : Needs assistance/impaired Eating/Feeding: Independent;Sitting   Grooming: Wash/dry hands;Wash/dry face;Sitting;Set up   Upper Body Bathing: Set up;Sitting   Lower Body Bathing: Moderate assistance;Sit to/from stand   Upper Body Dressing : Set up;Sitting   Lower Body Dressing:  Moderate assistance;Sit to/from stand Lower Body Dressing Details (indicate cue type and reason): pt able to don and doff sock bending over, difficulty due to edema in feet Toilet Transfer: Minimal assistance;Stand-pivot;BSC   Toileting- Clothing Manipulation and Hygiene: Moderate assistance;Sit to/from stand               Vision     Perception     Praxis      Pertinent Vitals/Pain Pain Assessment: No/denies pain     Hand Dominance Right   Extremity/Trunk Assessment Upper Extremity Assessment Upper Extremity Assessment: Overall WFL for tasks assessed   Lower Extremity Assessment Lower Extremity Assessment: Defer to PT evaluation   Cervical / Trunk Assessment Cervical / Trunk Assessment: Kyphotic   Communication Communication Communication: No difficulties   Cognition Arousal/Alertness: Awake/alert Behavior During Therapy: WFL for tasks assessed/performed Overall Cognitive Status: History of cognitive impairments - at baseline       Memory: Decreased short-term memory         General Comments: Pt inconsistent with report of her PLOF and assistance she received at ALF, son in room and assisting.   General Comments       Exercises       Shoulder Instructions      Home Living Family/patient expects to be discharged to:: Skilled nursing facility  Additional Comments: pt from ALF      Prior Functioning/Environment Level of Independence: Needs assistance  Gait / Transfers Assistance Needed: States she ambulates w/ assist w/ RW and she never gets OOB or chair w/o assist ADL's / Homemaking Assistance Needed: Assist w/ LB bathing and dressing, shower transfers        OT Diagnosis: Generalized weakness;Cognitive deficits   OT Problem List: Decreased strength;Decreased activity tolerance;Impaired balance (sitting and/or standing);Decreased cognition   OT Treatment/Interventions: Self-care/ADL training;DME  and/or AE instruction;Patient/family education;Balance training;Therapeutic activities    OT Goals(Current goals can be found in the care plan section) Acute Rehab OT Goals Patient Stated Goal: none stated but pt is agreeable to SNF OT Goal Formulation: With patient Time For Goal Achievement: 11/30/15 Potential to Achieve Goals: Fair ADL Goals Pt Will Perform Grooming: with min assist;standing Pt Will Perform Lower Body Bathing: with min assist;sit to/from stand Pt Will Perform Lower Body Dressing: with min assist;sit to/from stand Pt Will Transfer to Toilet: with min assist;ambulating;bedside commode Pt Will Perform Toileting - Clothing Manipulation and hygiene: with min assist;sit to/from stand  OT Frequency: Min 2X/week   Barriers to D/C:            Co-evaluation              End of Session Equipment Utilized During Treatment: Gait belt;Rolling walker  Activity Tolerance: Patient tolerated treatment well Patient left: in chair;with call bell/phone within reach;with family/visitor present   Time: 1420-1437 OT Time Calculation (min): 17 min Charges:  OT General Charges $OT Visit: 1 Procedure OT Evaluation $OT Eval Moderate Complexity: 1 Procedure G-Codes:    Malka So 11/16/2015, 2:51 PM  [phone]

## 2015-11-16 NOTE — Plan of Care (Signed)
Problem: Activity: Goal: Risk for activity intolerance will decrease Outcome: Progressing Pt ambulated in room 10 ft, standby, no assistive devices to chair. No complaints. Pt using BSC.

## 2015-11-16 NOTE — Progress Notes (Signed)
Echocardiogram 2D Echocardiogram has been performed.  Tresa Res 11/16/2015, 11:59 AM

## 2015-11-16 NOTE — Progress Notes (Signed)
[facility] PROGRESS NOTE  [name] [id number] DOB: 06-15-1934 DOA: 11/15/2015 PCP: Reymundo Poll, MD  Summary 82 yo female with history of CHF, CKD, HTN, asthma, COPP who was brought here by EMS from assisted living facility dye to dyspnea that has been going on for about a week per patient but worsened today, associated with LE edema for over a week. Patient is a poor historian, partially due to difficulty breathing. She denies fever/chills, cough, N/V/D/C. She denies chest pain or any pain.   Assessment/Plan:  Principal Problem:   Acute on chronic systolic (congestive) heart failure ([place]): improved. Change to po lasix. Chronically elevated BNP and weight appears stable from 12/16. Follow up echo. Mobilize with PT. Back to ALF in am if stable Active Problems:   COPD (chronic obstructive pulmonary disease) ([place]): stable, no wheeze   Dementia   Anemia of chronic disease: hgb unchanged from january   Essential hypertension: controlled   Hypothyroidism   Code Status:  full Family Communication:   Disposition Plan:  ALF tomorrow if stable  Consultants:    Procedures:     Antibiotics:    HPI/Subjective: Denies dyspnea.  Objective: Filed Vitals:   11/16/15 0500 11/16/15 0801  BP: 143/67 140/70  Pulse:  84  Temp: 98 F (36.7 C)   Resp: 18 20    Intake/Output Summary (Last 24 hours) at 11/16/15 0848 Last data filed at 11/16/15 0759  Gross per 24 hour  Intake   1320 ml  Output   1400 ml  Net    -80 ml   Filed Weights   11/15/15 1515 11/16/15 0500  Weight: 77.52 kg (170 lb 14.4 oz) 75.342 kg (166 lb 1.6 oz)    Exam:   General:  Alert. Confused. Cooperative. Breathing nonlabored.  Cardiovascular: Regular rate rhythm without murmurs gallops rubs  Respiratory: Clear to auscultation bilaterally without wheezes rhonchi or rales  Abdomen: Soft nontender nondistended  Ext: No clubbing cyanosis or edema  Basic Metabolic Panel:  Recent Labs Lab  11/15/15 0055 11/16/15 0711  NA 142 142  K 3.9 3.6  CL 106 101  CO2 23 27  GLUCOSE 93 84  BUN 18 29*  CREATININE 1.04* 1.00  CALCIUM 9.7 9.5   Liver Function Tests: No results for input(s): AST, ALT, ALKPHOS, BILITOT, PROT, ALBUMIN in the last 168 hours. No results for input(s): LIPASE, AMYLASE in the last 168 hours. No results for input(s): AMMONIA in the last 168 hours. CBC:  Recent Labs Lab 11/15/15 0055  WBC 6.6  HGB 9.3*  HCT 30.1*  MCV 89.3  PLT 438*   Cardiac Enzymes:  Recent Labs Lab 11/15/15 0524  TROPONINI 0.05*   BNP (last 3 results)  Recent Labs  10/01/15 1700 10/28/15 1723 11/15/15 0055  BNP 3232.1* 1406.5* 1669.5*    ProBNP (last 3 results) No results for input(s): PROBNP in the last 8760 hours.  CBG: No results for input(s): GLUCAP in the last 168 hours.  Recent Results (from the past 240 hour(s))  MRSA PCR Screening     Status: None   Collection Time: 11/15/15  9:41 AM  Result Value Ref Range Status   MRSA by PCR NEGATIVE NEGATIVE Final    Comment:        The GeneXpert MRSA Assay (FDA approved for NASAL specimens only), is one component of a comprehensive MRSA colonization surveillance program. It is not intended to diagnose MRSA infection nor to guide or monitor treatment for MRSA infections.  Studies: Dg Chest Port 1 View  11/15/2015  CLINICAL DATA:  82 year old female with shortness of breath and leg swelling. COPD. EXAM: PORTABLE CHEST 1 VIEW COMPARISON:  Radiograph dated 10/28/2015 FINDINGS: Stable cardiomegaly. There has been interval increase in the central vascular and interstitial prominence compatible with congestive changes or edema. Superimposed pneumonia is not excluded. No significant pleural effusion or pneumothorax. Left pectoral AICD. Degenerative changes of the spine. IMPRESSION: Cardiomegaly with congestive changes, worsened compared to the prior study. Superimposed pneumonia is not excluded. Clinical  correlation is recommended. Electronically Signed   By: Anner Crete M.D.   On: 11/15/2015 02:19    Scheduled Meds: . antiseptic oral rinse  7 mL Mouth Rinse BID  . aspirin EC  81 mg Oral BH-q7a  . atorvastatin  20 mg Oral Daily  . carvedilol  6.25 mg Oral BID WC  . enoxaparin (LOVENOX) injection  40 mg Subcutaneous Q24H  . feeding supplement (ENSURE ENLIVE)  237 mL Oral BID BM  . ferrous sulfate  325 mg Oral Q breakfast  . furosemide  40 mg Intravenous BID  . ipratropium-albuterol  3 mL Nebulization BID  . isosorbide mononitrate  30 mg Oral Daily  . levothyroxine  50 mcg Oral QAC breakfast  . lisinopril  20 mg Oral Daily  . mometasone-formoterol  2 puff Inhalation BID  . potassium chloride  10 mEq Oral Daily  . saccharomyces boulardii  250 mg Oral TID WC  . sertraline  25 mg Oral Daily  . sodium chloride flush  3 mL Intravenous Q12H   Continuous Infusions:   Time spent: 25 minutes  [facility] [email or web address], password [facility] 11/16/2015, 8:48 AM  LOS: 1 day

## 2015-11-16 NOTE — Progress Notes (Signed)
Utilization review completed. Florence Antonelli, RN, BSN. 

## 2015-11-16 NOTE — Research (Signed)
REDS'@Discharge'  Informed Consent   Subject Name: [name]  Subject met inclusion and exclusion criteria.  The informed consent form, study requirements and expectations were reviewed with the subject and questions and concerns were addressed prior to the signing of the consent form.  The subject verbalized understanding of the trail requirements.  The subject agreed to participate in the REDS'@Discharge'  trial and signed the informed consent.  The informed consent was obtained prior to performance of any protocol-specific procedures for the subject.  A copy of the signed informed consent was given to the subject and a copy was placed in the subject's medical record.  [name] 11/16/2015, 5:00 PM

## 2015-11-16 NOTE — Care Management Note (Addendum)
Case Management Note  Patient Details  Name: [name] MRN: [id number] Date of Birth: November 08, 1933  Subjective/Objective: Pt admitted for dyspnea. Pt is from [facility]) ALF. Per PT recommendations plan is for SNF. CSW assisting with disposition needs.                     Action/Plan: No further needs from CM at this time.    Expected Discharge Date:                  Expected Discharge Plan:  Skilled Nursing Facility  In-House Referral:  Clinical Social Work  Discharge planning Services  CM Consult  Post Acute Care Choice:  NA Choice offered to:  NA  DME Arranged:  N/A DME Agency:  NA  HH Arranged:  NA HH Agency:  NA  Status of Service:  Completed, signed off  Medicare Important Message Given:    Date Medicare IM Given:    Medicare IM give by:    Date Additional Medicare IM Given:    Additional Medicare Important Message give by:     If discussed at [place] of Stay Meetings, dates discussed:    Additional Comments: 1138 11-17-15 Jacqlyn Krauss, RN,BSN [phone] Pt is in ReDS Vest Trial and reading was >39+= 43. HF Team to consult with pt. CM will continue to monitor.   1038 11-17-15 Jacqlyn Krauss, RN,BSN [phone] Pt will plan to go to [facility] ALF- pt was denied via [facility] for SNF. CSW did speak with son in regards to plan of care. No further needs from CM at this time.   Bethena Roys, RN 11/16/2015, 11:06 AM

## 2015-11-17 DIAGNOSIS — J438 Other emphysema: Secondary | ICD-10-CM

## 2015-11-17 DIAGNOSIS — I5023 Acute on chronic systolic (congestive) heart failure: Secondary | ICD-10-CM

## 2015-11-17 DIAGNOSIS — E038 Other specified hypothyroidism: Secondary | ICD-10-CM

## 2015-11-17 DIAGNOSIS — I10 Essential (primary) hypertension: Secondary | ICD-10-CM

## 2015-11-17 LAB — BASIC METABOLIC PANEL
Anion gap: 8 (ref 5–15)
BUN: 32 mg/dL — AB (ref 6–20)
CALCIUM: 9.6 mg/dL (ref 8.9–10.3)
CHLORIDE: 103 mmol/L (ref 101–111)
CO2: 31 mmol/L (ref 22–32)
CREATININE: 1.06 mg/dL — AB (ref 0.44–1.00)
GFR calc non Af Amer: 49 mL/min — ABNORMAL LOW (ref >60)
GFR, EST AFRICAN AMERICAN: 56 mL/min — AB (ref >60)
Glucose, Bld: 95 mg/dL (ref 65–99)
Potassium: 3.9 mmol/L (ref 3.5–5.1)
Sodium: 142 mmol/L (ref 135–145)

## 2015-11-17 MED ORDER — FUROSEMIDE 40 MG PO TABS: 40.0000 mg | ORAL_TABLET | Freq: Two times a day (BID) | ORAL | Status: DC

## 2015-11-17 MED ORDER — FUROSEMIDE 10 MG/ML IJ SOLN
80.0000 mg | Freq: Two times a day (BID) | INTRAMUSCULAR | Status: DC
  Administered 2015-11-17 – 2015-11-18 (×3): 80 mg via INTRAVENOUS
  Filled 2015-11-17 (×3): qty 8

## 2015-11-17 MED ORDER — HYDROCODONE-ACETAMINOPHEN 5-325 MG PO TABS: 1.0000 | ORAL_TABLET | Freq: Four times a day (QID) | ORAL | Status: DC | PRN

## 2015-11-17 NOTE — Consult Note (Signed)
Advanced Heart Failure Team Consult Note  Referring Physician: Dr Sloan Leiter Primary Physician: dr Fredderick Phenix  Primary Cardiologist:    Reason for Consultation: Heart Failure RED VEST 43  HPI:   Shelby Hughesis a 82 y.o. 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   She presented to [facility] from [facility] with increased dyspnea and leg edema. While in the ED she required short term BiPap. Initially diuresed with IV lasix for 1day then transitioned to lasix 40 mg po bid. An ECHO was performed and showed an EF 35-40% Grade IDD, trivial pericardial effusion, and sepat-lateral dyssynchrony. CXR with congestive changes and possible pneumonia. No WBC or fever noted.    Pertinent admission labs include: BNP 1669, K 3.9, Creatinine 1.04 , troponin 0.02, and Hgb 9.3  Says she is a assisted living center. Nurse brings her meds but doesn't weigh her. Seems like meals are high in salt. Has frequent edema and exertional dyspnea. + bloating. No CP. Now feels better.   Review of Systems: [y] = yes, [ ]  = no   General: Weight gain Blue.Reese ]; Weight loss [ ] ; Anorexia [ ] ; Fatigue [ ] ; Fever [ ] ; Chills [ ] ; Weakness [ ]   Cardiac: Chest pain/pressure [ ] ; Resting SOB Blue.Reese ]; Exertional SOB [ y]; Orthopnea Blue.Reese ]; Pedal Edema Blue.Reese ]; Palpitations [ ] ; Syncope [ ] ; Presyncope [ ] ; Paroxysmal nocturnal dyspnea[ ]   Pulmonary: Cough [ ] ; Wheezing[ ] ; Hemoptysis[ ] ; Sputum [ ] ; Snoring [ ]   GI: Vomiting[ ] ; Dysphagia[ ] ; Melena[ ] ; Hematochezia [ ] ; Heartburn[ ] ; Abdominal pain [ ] ; Constipation [ ] ; Diarrhea [ ] ; BRBPR [ ]   GU: Hematuria[ ] ; Dysuria [ ] ; Nocturia[ ]   Vascular: Pain in legs with walking [ ] ; Pain in feet with lying flat [ ] ; Non-healing sores [ ] ; Stroke [ ] ; TIA [ ] ; Slurred speech [ ] ;  Neuro:  Headaches[ ] ; Vertigo[ ] ; Seizures[ ] ; Paresthesias[ ] ;Blurred vision [ ] ; Diplopia [ ] ; Vision changes [ ]   Ortho/Skin: Arthritis Blue.Reese ]; Joint pain [ y]; Muscle pain [ ] ; Joint swelling [ ] ; Back Pain [ ] ; Rash [ ]   Psych: Depression[ ] ; Anxiety[ ]   Heme: Bleeding problems [ ] ; Clotting disorders [ ] ; Anemia [ ]   Endocrine: Diabetes [ ] ; Thyroid dysfunction[ ]   Home Medications Prior to Admission medications   Medication Sig Start Date End Date Taking? Authorizing Provider  acetaminophen (TYLENOL) 325 MG tablet Take 650 mg by mouth every 6 (six) hours as needed (pain).   Yes Historical Provider, MD  ADVAIR DISKUS 500-50 MCG/DOSE AEPB Inhale 1 puff into the lungs every 12 (twelve) hours. 12/28/14  Yes Historical Provider, MD  albuterol (PROVENTIL) (2.5 MG/3ML) 0.083% nebulizer solution Take 3 mLs (2.5 mg total) by nebulization every 4 (four) hours as needed for wheezing. 04/28/15  Yes Geradine Girt, DO  aspirin EC 81 MG tablet Take 1 tablet (81 mg total) by mouth every morning. 03/03/15  Yes Ripudeep K Rai, MD  atorvastatin (LIPITOR) 20 MG tablet Take 20 mg by mouth daily.    Yes Historical Provider, MD  carvedilol (COREG) 6.25 MG tablet Take 1 tablet (6.25 mg total) by mouth 2 (two) times daily with a meal. 03/04/15  Yes Geradine Girt, DO  feeding supplement, ENSURE ENLIVE, (ENSURE ENLIVE) LIQD  Take 237 mLs by mouth 2 (two) times daily between meals. 03/02/15  Yes Ripudeep Krystal Eaton, MD  ferrous sulfate 325 (65 FE) MG tablet Take 325 mg by mouth every morning.   Yes Historical Provider, MD  isosorbide mononitrate (IMDUR) 30 MG 24 hr tablet Take 1 tablet (30 mg total) by mouth daily. 03/24/15  Yes Nishant Dhungel, MD  lactobacilus acidophilus & bulgar (FLORANEX) TABS chewable tablet Take 1 tablet by mouth 3 (three) times daily with meals. 07/29/15  Yes Florencia Reasons, MD  levothyroxine (SYNTHROID, LEVOTHROID) 50 MCG tablet Take 50 mcg by mouth every morning.  01/16/15  Yes Historical Provider, MD  lisinopril  (PRINIVIL,ZESTRIL) 20 MG tablet Take 20 mg by mouth daily.   Yes Historical Provider, MD  oxymetazoline (AFRIN) 0.05 % nasal spray Place 1 spray into both nostrils 2 (two) times daily as needed for congestion.   Yes Historical Provider, MD  Polyethyl Glycol-Propyl Glycol (SYSTANE OP) Place 1 drop into the left eye 2 (two) times daily.   Yes Historical Provider, MD  potassium chloride (K-DUR) 10 MEQ tablet Take 1 tablet (10 mEq total) by mouth daily. 10/28/15  Yes Noemi Chapel, MD  ranitidine (ZANTAC) 150 MG tablet Take 150 mg by mouth daily.  01/14/15  Yes Historical Provider, MD  sertraline (ZOLOFT) 25 MG tablet Take 25 mg by mouth daily.   Yes Historical Provider, MD  furosemide (LASIX) 40 MG tablet Take 1 tablet (40 mg total) by mouth 2 (two) times daily. 11/17/15   Shanker Kristeen Mans, MD  HYDROcodone-acetaminophen (NORCO/VICODIN) 5-325 MG tablet Take 1-2 tablets by mouth every 6 (six) hours as needed for moderate pain. 11/17/15   Shanker Kristeen Mans, MD    Past Medical History: Past Medical History  Diagnosis Date  . Hypertension   . Asthma   . CHF (congestive heart failure) ([place])   . COPD (chronic obstructive pulmonary disease) ([place])   . Colon cancer ([place])   . Hyperlipidemia   . Heart murmur   . Anginal pain ([place])   . Pulmonary embolism ([place])   . Pneumonia     "just once" (03/01/2015)  . Hypothyroidism   . History of blood transfusion     "related to anemia, this is my 1st" (03/01/2015)  . Arthritis     "comes and goes" (03/01/2015)  . Renal disorder   . Colostomy care ([place])   . Diabetes mellitus without complication ([place])     pt denies this hx on 03/01/2015    Past Surgical History: Past Surgical History  Procedure Laterality Date  . Cholecystectomy    . Colon surgery      cancer  . Fracture surgery    . Dilation and curettage of uterus    . Tubal ligation    . Cataract extraction w/ intraocular lens  implant, bilateral Bilateral   . Ankle fracture surgery Left   . Abdominal  hysterectomy    . Cardiac catheterization N/A 03/22/2015    Procedure: Right/Left Heart Cath and Coronary Angiography;  Surgeon: Leonie Man, MD;  Location: [place] [place];  Service: Cardiovascular;  Laterality: N/A;    Family History: Family History  Problem Relation Age of Onset  . CAD    . Hypertension    . Stroke    . Colon cancer Neg Hx   . GI Bleed Neg Hx     Social History: Social History   Social History  . Marital Status: Divorced    Spouse Name: N/A  .  Number of Children: N/A  . Years of Education: N/A   Occupational History  . Retired Psychologist, counselling    Social History Main Topics  . Smoking status: Never Smoker   . Smokeless tobacco: Never Used  . Alcohol Use: No  . Drug Use: No  . Sexual Activity: No   Other Topics Concern  . None   Social History Narrative    Allergies:  No Known Allergies  Objective:    Vital Signs:   Temp:  [98 F (36.7 C)-98.3 F (36.8 C)] 98.3 F (36.8 C) (02/08 1330) Pulse Rate:  [74-87] 81 (02/08 1330) Resp:  [18-27] 18 (02/08 1330) BP: (123-139)/(53-83) 139/53 mmHg (02/08 1330) SpO2:  [95 %-100 %] 100 % (02/08 1330) Weight:  [161 lb 11.2 oz (73.347 kg)] 161 lb 11.2 oz (73.347 kg) (02/08 0600) Last BM Date: 11/16/15  Weight change: Filed Weights   11/15/15 1515 11/16/15 0500 11/17/15 0600  Weight: 170 lb 14.4 oz (77.52 kg) 166 lb 1.6 oz (75.342 kg) 161 lb 11.2 oz (73.347 kg)    Intake/Output:   Intake/Output Summary (Last 24 hours) at 11/17/15 1404 Last data filed at 11/17/15 1000  Gross per 24 hour  Intake    603 ml  Output    450 ml  Net    153 ml     Physical Exam: General:  Well appearing. No resp difficulty. In a chair  HEENT: normal Neck: supple. JVP difficult to assess Carotids 2+ bilat; no bruits. No lymphadenopathy or thryomegaly appreciated. Cor: PMI nondisplaced. Regular rate & rhythm. No rubs, gallops or murmurs. Lungs: clear Abdomen: soft, nontender, distended. No  hepatosplenomegaly. No bruits or masses. Good bowel sounds. Extremities: no cyanosis, clubbing, rash, R and LLE 2+ edema Neuro: alert & orientedx3, cranial nerves grossly intact. moves all 4 extremities w/o difficulty. Affect pleasant  Telemetry: Sinus Rhythm 81  Labs: Basic Metabolic Panel:  Recent Labs Lab 11/15/15 0055 11/16/15 0711 11/17/15 0345  NA 142 142 142  K 3.9 3.6 3.9  CL 106 101 103  CO2 23 27 31   GLUCOSE 93 84 95  BUN 18 29* 32*  CREATININE 1.04* 1.00 1.06*  CALCIUM 9.7 9.5 9.6    Liver Function Tests: No results for input(s): AST, ALT, ALKPHOS, BILITOT, PROT, ALBUMIN in the last 168 hours. No results for input(s): LIPASE, AMYLASE in the last 168 hours. No results for input(s): AMMONIA in the last 168 hours.  CBC:  Recent Labs Lab 11/15/15 0055  WBC 6.6  HGB 9.3*  HCT 30.1*  MCV 89.3  PLT 438*    Cardiac Enzymes:  Recent Labs Lab 11/15/15 0524  TROPONINI 0.05*    BNP: BNP (last 3 results)  Recent Labs  10/01/15 1700 10/28/15 1723 11/15/15 0055  BNP 3232.1* 1406.5* 1669.5*    ProBNP (last 3 results) No results for input(s): PROBNP in the last 8760 hours.   CBG: No results for input(s): GLUCAP in the last 168 hours.  Coagulation Studies: No results for input(s): LABPROT, INR in the last 72 hours.  Other results: EKG: Sinus Tach 102.   Imaging:  No results found.   Medications:     Current Medications: . antiseptic oral rinse  7 mL Mouth Rinse BID  . aspirin EC  81 mg Oral BH-q7a  . atorvastatin  20 mg Oral Daily  . carvedilol  6.25 mg Oral BID WC  . enoxaparin (LOVENOX) injection  40 mg Subcutaneous Q24H  . feeding supplement (ENSURE ENLIVE)  237 mL Oral  BID BM  . ferrous sulfate  325 mg Oral Q breakfast  . furosemide  80 mg Intravenous BID  . isosorbide mononitrate  30 mg Oral Daily  . levothyroxine  50 mcg Oral QAC breakfast  . lisinopril  20 mg Oral Daily  . mometasone-formoterol  2 puff Inhalation BID  .  potassium chloride  10 mEq Oral Daily  . saccharomyces boulardii  250 mg Oral TID WC  . sertraline  25 mg Oral Daily  . sodium chloride flush  3 mL Intravenous Q12H     Infusions:      Assessment:  1. A/C Systolic Heart Failure . NICM cath 2016 cors ok-  [employer] ICD  2. Acute Respiratory Failure- Required BiPap on admit 3.Dementia  4. COPD 5. Hypothyroidism   Plan/Discussion:   [name] is a 82 year old admitted with A/C systolic heart failure. ECHO with EF 35-40%. Diuresed with 24 hours IV lasix then transitioned to po lasix. Today RED Vest reading was 43 so HF team consulted.    She does appear volume overloaded. Restart lasix 80 mg IV twice a day. Continue carvedilol 6.25 mg twice a day, lisinopril 20 mg daily, and imdur 30 mg daily. Consider adding hydralazine and spiro tomorrow will watch BP.     Length of Stay: 2  Shelby Clegg NP-C  11/17/2015, 2:04 PM  Advanced Heart Failure Team Pager [phone] (M-F; 7a - 4p)  Please contact [facility] for night-coverage after hours (4p -7a ) and weekends on [email or web address]  Patient seen and examined with Shelby Grinder, NP. We discussed all aspects of the encounter. I agree with the assessment and plan as stated above.   Although her neck veins are hard to see, she appears to have significant residual edema on exam and ReDS lung water remains quite high. Will restart IV lasix. Suspect she will need 2-3 days of IV diuresis. We will follow with you. Daily BMET to watch electrolytes. Will need to work on getting daily weights and sliding scale diuretic arranged for her at home.   Shelby Severin,MD 3:53 PM

## 2015-11-17 NOTE — Discharge Summary (Addendum)
PATIENT DETAILS Name: [name] Age: 82 y.o. Sex: female Date of Birth: 06/12/1934 MRN: [id number]. Admitting Physician: Gennaro Africa, MD [id number], Mallie Mussel, MD  Admit Date: 11/15/2015 Discharge date: 11/19/2015  Recommendations for Outpatient Follow-up:  1. Please check daily weights (see discharge instructions below)  2. Check chemistry panel in 1 week  3. Ensure follow-up with cardiology 4. Moderate mitral stenosis on echo-please monitor closely in outpatient setting  PRIMARY DISCHARGE DIAGNOSIS:  Principal Problem:   Acute on chronic systolic (congestive) heart failure (HCC) Active Problems:   COPD (chronic obstructive pulmonary disease) (HCC)   Dementia   Anemia of chronic disease   Essential hypertension   Hypothyroidism      PAST MEDICAL HISTORY: Past Medical History  Diagnosis Date  . Hypertension   . Asthma   . CHF (congestive heart failure) ([place])   . COPD (chronic obstructive pulmonary disease) ([place])   . Colon cancer ([place])   . Hyperlipidemia   . Heart murmur   . Anginal pain ([place])   . Pulmonary embolism ([place])   . Pneumonia     "just once" (03/01/2015)  . Hypothyroidism   . History of blood transfusion     "related to anemia, this is my 1st" (03/01/2015)  . Arthritis     "comes and goes" (03/01/2015)  . Renal disorder   . Colostomy care ([place])   . Diabetes mellitus without complication ([place])     pt denies this hx on 03/01/2015    DISCHARGE MEDICATIONS: Current Discharge Medication List    START taking these medications   Details  torsemide (DEMADEX) 20 MG tablet Take 2 tablets (40 mg total) by mouth daily.      CONTINUE these medications which have CHANGED   Details  HYDROcodone-acetaminophen (NORCO/VICODIN) 5-325 MG tablet Take 1-2 tablets by mouth every 6 (six) hours as needed for moderate pain. Qty: 30 tablet, Refills: 0      CONTINUE these medications which have NOT CHANGED   Details  acetaminophen (TYLENOL) 325 MG tablet Take 650 mg by  mouth every 6 (six) hours as needed (pain).    ADVAIR DISKUS 500-50 MCG/DOSE AEPB Inhale 1 puff into the lungs every 12 (twelve) hours. Refills: 3    albuterol (PROVENTIL) (2.5 MG/3ML) 0.083% nebulizer solution Take 3 mLs (2.5 mg total) by nebulization every 4 (four) hours as needed for wheezing. Qty: 75 mL, Refills: 12    aspirin EC 81 MG tablet Take 1 tablet (81 mg total) by mouth every morning.    atorvastatin (LIPITOR) 20 MG tablet Take 20 mg by mouth daily.     carvedilol (COREG) 6.25 MG tablet Take 1 tablet (6.25 mg total) by mouth 2 (two) times daily with a meal.    feeding supplement, ENSURE ENLIVE, (ENSURE ENLIVE) LIQD Take 237 mLs by mouth 2 (two) times daily between meals. Qty: 237 mL, Refills: 12    ferrous sulfate 325 (65 FE) MG tablet Take 325 mg by mouth every morning.    isosorbide mononitrate (IMDUR) 30 MG 24 hr tablet Take 1 tablet (30 mg total) by mouth daily. Qty: 30 tablet, Refills: 0    lactobacilus acidophilus & bulgar (FLORANEX) TABS chewable tablet Take 1 tablet by mouth 3 (three) times daily with meals. Qty: 30 tablet, Refills: 0    levothyroxine (SYNTHROID, LEVOTHROID) 50 MCG tablet Take 50 mcg by mouth every morning.  Refills: 2    lisinopril (PRINIVIL,ZESTRIL) 20 MG tablet Take 20 mg by mouth daily.    oxymetazoline (AFRIN)  0.05 % nasal spray Place 1 spray into both nostrils 2 (two) times daily as needed for congestion.    Polyethyl Glycol-Propyl Glycol (SYSTANE OP) Place 1 drop into the left eye 2 (two) times daily.    potassium chloride (K-DUR) 10 MEQ tablet Take 1 tablet (10 mEq total) by mouth daily. Qty: 5 tablet, Refills: 0    ranitidine (ZANTAC) 150 MG tablet Take 150 mg by mouth daily.  Refills: 11    sertraline (ZOLOFT) 25 MG tablet Take 25 mg by mouth daily.      STOP taking these medications     furosemide (LASIX) 40 MG tablet         ALLERGIES:  No Known Allergies  BRIEF HPI:  See H&P, Labs, Consult and Test reports for  all details in brief, 82 yo female with history of CHF, CKD, HTN, asthma, COPP who was brought here by EMS from assisted living facility dye to dyspnea that has been going on for about a week per patient but worsened today, associated with LE edema for over a week.   CONSULTATIONS:   None  PERTINENT RADIOLOGIC STUDIES: Dg Chest Port 1 View  11/15/2015  CLINICAL DATA:  82 year old female with shortness of breath and leg swelling. COPD. EXAM: PORTABLE CHEST 1 VIEW COMPARISON:  Radiograph dated 10/28/2015 FINDINGS: Stable cardiomegaly. There has been interval increase in the central vascular and interstitial prominence compatible with congestive changes or edema. Superimposed pneumonia is not excluded. No significant pleural effusion or pneumothorax. Left pectoral AICD. Degenerative changes of the spine. IMPRESSION: Cardiomegaly with congestive changes, worsened compared to the prior study. Superimposed pneumonia is not excluded. Clinical correlation is recommended. Electronically Signed   By: Anner Crete M.D.   On: 11/15/2015 02:19   Dg Chest Port 1 View  10/28/2015  CLINICAL DATA:  82 year old female with weakness and fever for 1 day. Upper and lower extremity edema. EXAM: PORTABLE CHEST 1 VIEW COMPARISON:  10/01/2015 prior exam FINDINGS: Upper limits normal heart size and left-sided pacemaker/ICD again noted. Mild interstitial opacities bilaterally may represent interstitial edema. Left lower lung consolidation/ atelectasis again noted. There is no evidence of pneumothorax. IMPRESSION: Mild interstitial opacities which may represent interstitial pulmonary edema. Continued left lower lobe consolidation/atelectasis. Electronically Signed   By: Margarette Canada M.D.   On: 10/28/2015 17:59     PERTINENT LAB RESULTS: CBC: No results for input(s): WBC, HGB, HCT, PLT in the last 72 hours. CMET CMP     Component Value Date/Time   NA 140 11/19/2015 0500   K 3.8 11/19/2015 0500   CL 98* 11/19/2015 0500     CO2 32 11/19/2015 0500   GLUCOSE 80 11/19/2015 0500   BUN 30* 11/19/2015 0500   CREATININE 1.22* 11/19/2015 0500   CREATININE 0.83 04/14/2015 1514   CALCIUM 9.9 11/19/2015 0500   PROT 7.7 10/28/2015 1723   ALBUMIN 3.7 10/28/2015 1723   AST 23 10/28/2015 1723   ALT 19 10/28/2015 1723   ALKPHOS 56 10/28/2015 1723   BILITOT 0.9 10/28/2015 1723   GFRNONAA 41* 11/19/2015 0500   GFRAA 48* 11/19/2015 0500    GFR Estimated Creatinine Clearance: 38.4 mL/min (by C-G formula based on Cr of 1.22). No results for input(s): LIPASE, AMYLASE in the last 72 hours. No results for input(s): CKTOTAL, CKMB, CKMBINDEX, TROPONINI in the last 72 hours. Invalid input(s): POCBNP No results for input(s): DDIMER in the last 72 hours. No results for input(s): HGBA1C in the last 72 hours. No results for input(s):  CHOL, HDL, LDLCALC, TRIG, CHOLHDL, LDLDIRECT in the last 72 hours. No results for input(s): TSH, T4TOTAL, T3FREE, THYROIDAB in the last 72 hours.  Invalid input(s): FREET3 No results for input(s): VITAMINB12, FOLATE, FERRITIN, TIBC, IRON, RETICCTPCT in the last 72 hours. Coags: No results for input(s): INR in the last 72 hours.  Invalid input(s): PT Microbiology: Recent Results (from the past 240 hour(s))  MRSA PCR Screening     Status: None   Collection Time: 11/15/15  9:41 AM  Result Value Ref Range Status   MRSA by PCR NEGATIVE NEGATIVE Final    Comment:        The GeneXpert MRSA Assay (FDA approved for NASAL specimens only), is one component of a comprehensive MRSA colonization surveillance program. It is not intended to diagnose MRSA infection nor to guide or monitor treatment for MRSA infections.      BRIEF HOSPITAL COURSE:  Acute on chronic systolic heart failure: EF by echocardiogram on 11/16/15 around 35-40%. Admitted and started on IV Lasix, subsequently seen by CHF team-and now much more clinically compensated. Weight is reduced to 162 pounds in the day of discharge (170  pounds on admission). Will be transitioned to [facility], continue current dosing of hydralazine, Imdur and lisinopril. Please make sure patient follows up with CHF team-see below for appointment.  Addendum 5:30 PM: After discharge summary was done-apparently patient became somewhat dizzy on standing up. RN contacted CHF team-who treated the patient with IV bolus 500 mL 2. I evaluated patient later in the afternoon non-felt much better without any dizziness. Per RN, CHF team has cleared the patient for discharge. Stable for discharge today.  COPD: Stable without any wheezing. Continue home medications.  Hypertension: Controlled, continue usual medications.  Hypothyroidism: Continue Synthroid  Dementia: Pleasantly confused-follows all my commands. Stable for outpatient follow-up/monitoring.  Deconditioning/generalized weakness: Evaluated by physical therapy, although SNF was recommended-because of insurance issues being discharged back to ALF with home health services. Please note, family-son okay with this plan.  AICD in place: Ensure outpatient follow-up with cardiology.  CKD stage II: Continue outpatient monitoring. Closely watch while on diuretics  TODAY-DAY OF DISCHARGE:  Subjective:   Shahadah Pelowski today has no headache,no chest abdominal pain,no new weakness tingling or numbness, feels much better wants to go home today.   Objective:   Blood pressure 132/59, pulse 78, temperature 98.2 F (36.8 C), temperature source Oral, resp. rate 17, height 5\' 6"  (1.676 m), weight 73.573 kg (162 lb 3.2 oz), SpO2 98 %.  Intake/Output Summary (Last 24 hours) at 11/19/15 1115 Last data filed at 11/19/15 1100  Gross per 24 hour  Intake   1080 ml  Output   2926 ml  Net  -1846 ml   Filed Weights   11/17/15 0600 11/18/15 0550 11/19/15 0402  Weight: 73.347 kg (161 lb 11.2 oz) 74.208 kg (163 lb 9.6 oz) 73.573 kg (162 lb 3.2 oz)    Exam Awake Alert, Oriented *3, No new F.N deficits, Normal  affect [place].AT,PERRAL Supple Neck,No JVD, No cervical lymphadenopathy appriciated.  Symmetrical Chest wall movement, Good air movement bilaterally, CTAB RRR,No Gallops,Rubs or new Murmurs, No Parasternal Heave +ve B.Sounds, Abd Soft, Non tender, No organomegaly appriciated, No rebound -guarding or rigidity. No Cyanosis, Clubbing or edema, No new Rash or bruise  DISCHARGE CONDITION: Stable  DISPOSITION: ALF with home health services  DISCHARGE INSTRUCTIONS:    Activity:  As tolerated with Full fall precautions use walker/cane & assistance as needed  Get Medicines reviewed and adjusted: Please  take all your medications with you for your next visit with your Primary MD  Please request your Primary MD to go over all hospital tests and procedure/radiological results at the follow up, please ask your Primary MD to get all Hospital records sent to his/her office.  If you experience worsening of your admission symptoms, develop shortness of breath, life threatening emergency, suicidal or homicidal thoughts you must seek medical attention immediately by calling 911 or calling your MD immediately  if symptoms less severe.  You must read complete instructions/literature along with all the possible adverse reactions/side effects for all the Medicines you take and that have been prescribed to you. Take any new Medicines after you have completely understood and accpet all the possible adverse reactions/side effects.   Do not drive when taking Pain medications.   Do not take more than prescribed Pain, Sleep and Anxiety Medications  Special Instructions: If you have smoked or chewed Tobacco  in the last 2 yrs please stop smoking, stop any regular Alcohol  and or any Recreational drug use.  Wear Seat belts while driving.  Please note  You were cared for by a hospitalist during your hospital stay. Once you are discharged, your primary care physician will handle any further medical issues. Please  note that NO REFILLS for any discharge medications will be authorized once you are discharged, as it is imperative that you return to your primary care physician (or establish a relationship with a primary care physician if you do not have one) for your aftercare needs so that they can reassess your need for medications and monitor your lab values.   Diet recommendation: Heart Healthy diet Fluid restriction-1500 cc/day  Discharge Instructions    (HEART FAILURE PATIENTS) Call MD:  Anytime you have any of the following symptoms: 1) 3 pound weight gain in 24 hours or 5 pounds in 1 week 2) shortness of breath, with or without a dry hacking cough 3) swelling in the hands, feet or stomach 4) if you have to sleep on extra pillows at night in order to breathe.    Complete by:  As directed      Diet - low sodium heart healthy    Complete by:  As directed      Heart Failure patients record your daily weight using the same scale at the same time of day    Complete by:  As directed      Increase activity slowly    Complete by:  As directed      STOP any activity that causes chest pain, shortness of breath, dizziness, sweating, or exessive weakness    Complete by:  As directed            Follow-up Information    Follow up with Reymundo Poll, MD. Schedule an appointment as soon as possible for a visit in 1 week.   Specialty:  Family Medicine   Contact information:   [address] [place] [address] [phone]       Follow up with Darrick Grinder, NP On 11/25/2015.   Specialty:  Cardiology   Why:  Heart Failure [place] on the 1st Floot 10:20    Contact information:   [address] [place] [address] [phone]      Total Time spent on discharge equals  45 minutes.  SignedOren Binet 11/19/2015 11:15 AM

## 2015-11-17 NOTE — Clinical Social Work Note (Signed)
Clinical Social Worker continuing to follow patient and family for support and discharge planning needs.  CSW left a message for patient son, however patient son is in agreement with patient return to [facility].  Patient was declined by [facility] for ST-SNF.  [address] [place]) in agreement with patient return once medically stable.  Patient started on IV Lasix and not yet medically ready - CSW remains available for support and to facilitate patient discharge needs.  Barbette Or, [place]

## 2015-11-17 NOTE — Progress Notes (Signed)
  Your patient is in the Unblinded arm of the Vest at Discharge study.  The ReDS reading is:  ( >39)  =43  Your patient will not be discharged at this time and will have a AHF team consult.  Thank You   The research team

## 2015-11-17 NOTE — Progress Notes (Signed)
Physical Therapy Treatment Patient Details Name: [name] MRN: [id number] DOB: 06-20-1934 Today's Date: 11/17/2015    History of Present Illness Pt is a 82 y/o F admitted from her [place] due to dyspnea associated w/ LE edema.  Pt's PMH includes CHF, COPD, colon cancer, anginal pain, PE, hypothyroidism, colostomy, ankle fx surgery.    PT Comments    [name] was not agitated today and was eager to participate in therapy.  Pt initially requires min assist to boost up to standing w/ sit>stand due to posterior lean, but transitions to min guard assist after practicing sit<>stand x4 from chair at end of session.  Pt will benefit from continued skilled PT services to increase functional independence and safety.   Follow Up Recommendations  SNF;Supervision/Assistance - 24 hour     Equipment Recommendations  None recommended by PT    Recommendations for Other Services       Precautions / Restrictions Precautions Precautions: Fall Restrictions Weight Bearing Restrictions: No    Mobility  Bed Mobility Overal bed mobility: Needs Assistance Bed Mobility: Supine to Sit     Supine to sit: Min guard     General bed mobility comments: Increased time w/ HOB elevated  Transfers Overall transfer level: Needs assistance Equipment used: Rolling walker (2 wheeled) Transfers: Sit to/from Stand Sit to Stand: Min assist         General transfer comment: Initial sit>stand attempt pt rises to 75% stand but posterior lean causes pt to return to sitting.  Min assist to boost up to standing on second attempt.  Cues for hand placement and use of RW.  Practiced sit<>stand from recliner chair x4 at end of session w/ min guard assist.  Ambulation/Gait Ambulation/Gait assistance: Min guard Ambulation Distance (Feet): 100 Feet Assistive device: Rolling walker (2 wheeled) Gait Pattern/deviations: Step-through pattern;Decreased stride length;Trunk flexed;Drifts  right/left;Antalgic   Gait velocity interpretation: Below normal speed for age/gender General Gait Details: Pt drifts Lt and Rt w/ head turns while ambulating.  Cues for upright posture and directing.   Stairs            Wheelchair Mobility    Modified Rankin (Stroke Patients Only)       Balance Overall balance assessment: Needs assistance Sitting-balance support: Bilateral upper extremity supported;Feet supported Sitting balance-Leahy Scale: Fair     Standing balance support: Bilateral upper extremity supported;During functional activity Standing balance-Leahy Scale: Poor Standing balance comment: RW for support                    Cognition Arousal/Alertness: Awake/alert Behavior During Therapy: WFL for tasks assessed/performed Overall Cognitive Status: History of cognitive impairments - at baseline Area of Impairment: Memory     Memory: Decreased short-term memory              Exercises General Exercises - Lower Extremity Ankle Circles/Pumps: AROM;Both;Seated;20 reps;Supine Other Exercises Other Exercises: Sit<>stand from recliner chair w/ min guard assist.  Cues provided to scoot to edge of seat before standing and to push from armrests. Initially uncontrolled descent transitioning to controlled descent following verbal cues.    General Comments        Pertinent Vitals/Pain Pain Assessment: No/denies pain    Home Living                      Prior Function            PT Goals (current goals can now be found in  the care plan section) Acute Rehab PT Goals Patient Stated Goal: to be able to stand up on her own PT Goal Formulation: With patient Time For Goal Achievement: 11/29/15 Potential to Achieve Goals: Good Progress towards PT goals: Progressing toward goals    Frequency  Min 2X/week    PT Plan Current plan remains appropriate    Co-evaluation             End of Session Equipment Utilized During Treatment: Gait  belt Activity Tolerance: Patient tolerated treatment well;Patient limited by fatigue Patient left: in chair;with call bell/phone within reach     Time: 0905-0935 PT Time Calculation (min) (ACUTE ONLY): 30 min  Charges:  $Gait Training: 8-22 mins $Therapeutic Exercise: 8-22 mins                    G Codes:      Joslyn Hy PT, DPT [phone] Pager: [phone] 11/17/2015, 9:44 AM

## 2015-11-17 NOTE — Progress Notes (Signed)
Occupational Therapy Treatment Patient Details Name: [name] MRN: [id number] DOB: 1933/12/27 Today's Date: 11/17/2015    History of present illness Pt is a 82 y/o F admitted from her [place] due to dyspnea associated w/ LE edema.  Pt's PMH includes CHF, COPD, colon cancer, anginal pain, PE, hypothyroidism, colostomy, ankle fx surgery.   OT comments  Pt eager to get OOB. Min assist and verbal cues for technique for sit to stand from bed and 3 in 1 over toilet.  Pt performed 2 grooming activities standing at sink and pericare with min assist. Progressing well.  Follow Up Recommendations  SNF;Supervision/Assistance - 24 hour    Equipment Recommendations       Recommendations for Other Services      Precautions / Restrictions Precautions Precautions: Fall Restrictions Weight Bearing Restrictions: No       Mobility Bed Mobility Overal bed mobility: Needs Assistance Bed Mobility: Supine to Sit     Supine to sit: Min guard     General bed mobility comments: Increased time w/ HOB elevated  Transfers Overall transfer level: Needs assistance Equipment used: Rolling walker (2 wheeled) Transfers: Sit to/from Stand Sit to Stand: Min assist             Balance                                   ADL Overall ADL's : Needs assistance/impaired     Grooming: Wash/dry hands;Wash/dry face;Brushing hair;Standing;Supervision/safety           Upper Body Dressing : Set up;Sitting Upper Body Dressing Details (indicate cue type and reason): front opening gown     Toilet Transfer: Min Lobbyist Details (indicate cue type and reason): BSC over toilet Toileting- Clothing Manipulation and Hygiene: Sit to/from stand;Minimal assistance Toileting - Clothing Manipulation Details (indicate cue type and reason): assist to hold up gowns at pt performed pericare     Functional mobility during ADLs: Min guard;Rolling walker;Cueing  for safety        Vision                     Perception     Praxis      Cognition   Behavior During Therapy: [facility] for tasks assessed/performed Overall Cognitive Status: History of cognitive impairments - at baseline Area of Impairment: Memory     Memory: Decreased short-term memory               Extremity/Trunk Assessment               Exercises     Shoulder Instructions       General Comments      Pertinent Vitals/ Pain       Pain Assessment: No/denies pain  Home Living                                          Prior Functioning/Environment              Frequency Min 2X/week     Progress Toward Goals  OT Goals(current goals can now be found in the care plan section)  Progress towards OT goals: Progressing toward goals  Acute Rehab OT Goals Patient Stated Goal: to be able to stand up on her own  Plan  Discharge plan remains appropriate    Co-evaluation                 End of Session     Activity Tolerance Patient tolerated treatment well   Patient Left in chair;with call bell/phone within reach (with PT)   Nurse Communication          [id number] OT Time Calculation (min): 15 min  Charges: OT General Charges $OT Visit: 1 Procedure OT Treatments $Self Care/Home Management : 8-22 mins  Malka So 11/17/2015, 9:31 AM  [phone]

## 2015-11-18 DIAGNOSIS — J449 Chronic obstructive pulmonary disease, unspecified: Secondary | ICD-10-CM

## 2015-11-18 DIAGNOSIS — F0390 Unspecified dementia without behavioral disturbance: Secondary | ICD-10-CM

## 2015-11-18 LAB — BASIC METABOLIC PANEL
Anion gap: 11 (ref 5–15)
BUN: 29 mg/dL — AB (ref 6–20)
CALCIUM: 9.7 mg/dL (ref 8.9–10.3)
CO2: 30 mmol/L (ref 22–32)
CREATININE: 1.07 mg/dL — AB (ref 0.44–1.00)
Chloride: 100 mmol/L — ABNORMAL LOW (ref 101–111)
GFR calc Af Amer: 56 mL/min — ABNORMAL LOW (ref >60)
GFR, EST NON AFRICAN AMERICAN: 48 mL/min — AB (ref >60)
GLUCOSE: 78 mg/dL (ref 65–99)
POTASSIUM: 3.9 mmol/L (ref 3.5–5.1)
SODIUM: 141 mmol/L (ref 135–145)

## 2015-11-18 MED ORDER — METOLAZONE 5 MG PO TABS
2.5000 mg | ORAL_TABLET | Freq: Once | ORAL | Status: AC
  Administered 2015-11-18: 2.5 mg via ORAL
  Filled 2015-11-18: qty 1

## 2015-11-18 MED ORDER — POTASSIUM CHLORIDE CRYS ER 20 MEQ PO TBCR
20.0000 meq | EXTENDED_RELEASE_TABLET | Freq: Once | ORAL | Status: AC
  Administered 2015-11-18: 20 meq via ORAL
  Filled 2015-11-18: qty 1

## 2015-11-18 NOTE — Care Management Important Message (Signed)
Important Message  Patient Details  Name: AHONESTY VIELE MRN: [id number] Date of Birth: 1934-02-14   Medicare Important Message Given:  Yes    Ginny Loomer, [place] 11/18/2015, 8:29 AM

## 2015-11-18 NOTE — Progress Notes (Signed)
PATIENT DETAILS Name: [name] Age: 82 y.o. Sex: female Date of Birth: July 13, 1934 Admit Date: 11/15/2015 Admitting Physician Gennaro Africa, MD [id number], Mallie Mussel, MD  Subjective: Denies SOB or chest pain  Assessment/Plan: Acute on chronic systolic heart failure:Improving with IV Lasix.EF by echocardiogram on 11/16/15 around 35-40%.Cards following. D/c when ok with cards.  COPD: Stable without any wheezing. Continue home medications.  Hypertension: Controlled, and than usual medications.  Hypothyroidism: Continue Synthroid  Dementia: Pleasantly confused-follows all my commands. Stable for outpatient follow-up/monitoring.  Deconditioning/generalized weakness: Evaluated by physical therapy, although SNF was recommended-because of insurance issues being discharged back to ALF with home health services.   AICD in place: Ensure outpatient follow-up with cardiology.  CKD stage II: Continue outpatient monitoring. Closely watch while on Lasix.  Disposition: Remain inpatient-back to ALF when cleared by cardiology  Antimicrobial agents  See below  Anti-infectives    None      DVT Prophylaxis: Prophylactic Lovenox  Code Status: Full code   Family Communication None at bedside  Procedures: None  CONSULTS:  cardiology  Time spent 25 minutes-Greater than 50% of this time was spent in counseling, explanation of diagnosis, planning of further management, and coordination of care.  MEDICATIONS: Scheduled Meds: . antiseptic oral rinse  7 mL Mouth Rinse BID  . aspirin EC  81 mg Oral BH-q7a  . atorvastatin  20 mg Oral Daily  . carvedilol  6.25 mg Oral BID WC  . enoxaparin (LOVENOX) injection  40 mg Subcutaneous Q24H  . feeding supplement (ENSURE ENLIVE)  237 mL Oral BID BM  . ferrous sulfate  325 mg Oral Q breakfast  . furosemide  80 mg Intravenous BID  . isosorbide mononitrate  30 mg Oral Daily  . levothyroxine  50 mcg Oral QAC breakfast  . lisinopril   20 mg Oral Daily  . mometasone-formoterol  2 puff Inhalation BID  . potassium chloride  10 mEq Oral Daily  . saccharomyces boulardii  250 mg Oral TID WC  . sertraline  25 mg Oral Daily  . sodium chloride flush  3 mL Intravenous Q12H   Continuous Infusions:  PRN Meds:.sodium chloride, acetaminophen, HYDROcodone-acetaminophen, ipratropium-albuterol, ondansetron (ZOFRAN) IV, oxymetazoline, sodium chloride flush    PHYSICAL EXAM: Vital signs in last 24 hours: Filed Vitals:   11/17/15 2003 11/17/15 2135 11/18/15 0550 11/18/15 0848  BP:  115/58 139/54   Pulse:  78 82   Temp:  98.1 F (36.7 C) 98.6 F (37 C)   TempSrc:  Oral Oral   Resp:  16 18   Height:      Weight:   74.208 kg (163 lb 9.6 oz)   SpO2: 96% 100% 100% 100%    Weight change: 0.862 kg (1 lb 14.4 oz) Filed Weights   11/16/15 0500 11/17/15 0600 11/18/15 0550  Weight: 75.342 kg (166 lb 1.6 oz) 73.347 kg (161 lb 11.2 oz) 74.208 kg (163 lb 9.6 oz)   Body mass index is 26.42 kg/(m^2).   Gen Exam: Awake and alert with clear speech.   Neck: Supple, No JVD.  Chest: B/L Clear.   CVS: S1 S2 Regular, no murmurs.  Abdomen: soft, BS +, non tender, non distended.  Extremities: no edema, lower extremities warm to touch. Neurologic: Non Focal.   Skin: No Rash.   Wounds: N/A.    Intake/Output from previous day:  Intake/Output Summary (Last 24 hours) at 11/18/15 0919 Last data filed at  11/18/15 0838  Gross per 24 hour  Intake    663 ml  Output   1850 ml  Net  -1187 ml     LAB RESULTS: CBC  Recent Labs Lab 11/15/15 0055  WBC 6.6  HGB 9.3*  HCT 30.1*  PLT 438*  MCV 89.3  MCH 27.6  MCHC 30.9  RDW 17.4*    Chemistries   Recent Labs Lab 11/15/15 0055 11/16/15 0711 11/17/15 0345 11/18/15 0624  NA 142 142 142 141  K 3.9 3.6 3.9 3.9  CL 106 101 103 100*  CO2 23 27 31 30   GLUCOSE 93 84 95 78  BUN 18 29* 32* 29*  CREATININE 1.04* 1.00 1.06* 1.07*  CALCIUM 9.7 9.5 9.6 9.7    CBG: No results for  input(s): GLUCAP in the last 168 hours.  GFR Estimated Creatinine Clearance: 43.9 mL/min (by C-G formula based on Cr of 1.07).  Coagulation profile No results for input(s): INR, PROTIME in the last 168 hours.  Cardiac Enzymes  Recent Labs Lab 11/15/15 0524  TROPONINI 0.05*    Invalid input(s): POCBNP No results for input(s): DDIMER in the last 72 hours. No results for input(s): HGBA1C in the last 72 hours. No results for input(s): CHOL, HDL, LDLCALC, TRIG, CHOLHDL, LDLDIRECT in the last 72 hours. No results for input(s): TSH, T4TOTAL, T3FREE, THYROIDAB in the last 72 hours.  Invalid input(s): FREET3 No results for input(s): VITAMINB12, FOLATE, FERRITIN, TIBC, IRON, RETICCTPCT in the last 72 hours. No results for input(s): LIPASE, AMYLASE in the last 72 hours.  Urine Studies No results for input(s): UHGB, CRYS in the last 72 hours.  Invalid input(s): UACOL, UAPR, USPG, UPH, UTP, UGL, UKET, UBIL, UNIT, UROB, ULEU, UEPI, UWBC, URBC, UBAC, CAST, UCOM, BILUA  MICROBIOLOGY: Recent Results (from the past 240 hour(s))  MRSA PCR Screening     Status: None   Collection Time: 11/15/15  9:41 AM  Result Value Ref Range Status   MRSA by PCR NEGATIVE NEGATIVE Final    Comment:        The GeneXpert MRSA Assay (FDA approved for NASAL specimens only), is one component of a comprehensive MRSA colonization surveillance program. It is not intended to diagnose MRSA infection nor to guide or monitor treatment for MRSA infections.     RADIOLOGY STUDIES/RESULTS: Dg Chest Port 1 View  11/15/2015  CLINICAL DATA:  82 year old female with shortness of breath and leg swelling. COPD. EXAM: PORTABLE CHEST 1 VIEW COMPARISON:  Radiograph dated 10/28/2015 FINDINGS: Stable cardiomegaly. There has been interval increase in the central vascular and interstitial prominence compatible with congestive changes or edema. Superimposed pneumonia is not excluded. No significant pleural effusion or pneumothorax.  Left pectoral AICD. Degenerative changes of the spine. IMPRESSION: Cardiomegaly with congestive changes, worsened compared to the prior study. Superimposed pneumonia is not excluded. Clinical correlation is recommended. Electronically Signed   By: Anner Crete M.D.   On: 11/15/2015 02:19   Dg Chest Port 1 View  10/28/2015  CLINICAL DATA:  82 year old female with weakness and fever for 1 day. Upper and lower extremity edema. EXAM: PORTABLE CHEST 1 VIEW COMPARISON:  10/01/2015 prior exam FINDINGS: Upper limits normal heart size and left-sided pacemaker/ICD again noted. Mild interstitial opacities bilaterally may represent interstitial edema. Left lower lung consolidation/ atelectasis again noted. There is no evidence of pneumothorax. IMPRESSION: Mild interstitial opacities which may represent interstitial pulmonary edema. Continued left lower lobe consolidation/atelectasis. Electronically Signed   By: Margarette Canada M.D.   On: 10/28/2015  17:59    Oren Binet, MD  [facility] Pager:[phone]  If 7PM-7AM, please contact night-coverage [email or web address] Password [email or web address] 11/18/2015, 9:19 AM   LOS: 3 days

## 2015-11-18 NOTE — Progress Notes (Signed)
Advanced Heart Failure Rounding Note   Subjective:    Denies dyspnea. Says she is feeling good. Urine output seems to have picked up but weight up a pound. (reweighed personally on standing scale).     Objective:   Weight Range:  Vital Signs:   Temp:  [98.1 F (36.7 C)-98.6 F (37 C)] 98.6 F (37 C) (02/09 0550) Pulse Rate:  [78-82] 82 (02/09 0550) Resp:  [16-18] 18 (02/09 0550) BP: (115-139)/(53-58) 139/54 mmHg (02/09 0550) SpO2:  [96 %-100 %] 100 % (02/09 0848) Weight:  [163 lb 9.6 oz (74.208 kg)] 163 lb 9.6 oz (74.208 kg) (02/09 0550) Last BM Date: 11/17/15  Weight change: Filed Weights   11/16/15 0500 11/17/15 0600 11/18/15 0550  Weight: 166 lb 1.6 oz (75.342 kg) 161 lb 11.2 oz (73.347 kg) 163 lb 9.6 oz (74.208 kg)    Intake/Output:   Intake/Output Summary (Last 24 hours) at 11/18/15 1252 Last data filed at 11/18/15 1049  Gross per 24 hour  Intake    420 ml  Output   1850 ml  Net  -1430 ml     Physical Exam: General: Well appearing. No resp difficulty. In bed HEENT: normal Neck: supple. JVP difficult to assess Carotids 2+ bilat; no bruits. No lymphadenopathy or thryomegaly appreciated. Cor: PMI nondisplaced. Regular rate & rhythm. No rubs, gallops or murmurs. Lungs: clear Abdomen: soft, nontender, distended. No hepatosplenomegaly. No bruits or masses. Good bowel sounds. Extremities: no cyanosis, clubbing, rash, R and LLE 1-2+ edema Neuro: alert & orientedx3, cranial nerves grossly intact. moves all 4 extremities w/o difficulty. Affect pleasant. Poor memory.   Telemetry: Sinus Rhythm 80s  Labs: Basic Metabolic Panel:  Recent Labs Lab 11/15/15 0055 11/16/15 0711 11/17/15 0345 11/18/15 0624  NA 142 142 142 141  K 3.9 3.6 3.9 3.9  CL 106 101 103 100*  CO2 23 27 31 30   GLUCOSE 93 84 95 78  BUN 18 29* 32* 29*  CREATININE 1.04* 1.00 1.06* 1.07*  CALCIUM 9.7 9.5 9.6 9.7    Liver Function Tests: No results for input(s): AST, ALT, ALKPHOS,  BILITOT, PROT, ALBUMIN in the last 168 hours. No results for input(s): LIPASE, AMYLASE in the last 168 hours. No results for input(s): AMMONIA in the last 168 hours.  CBC:  Recent Labs Lab 11/15/15 0055  WBC 6.6  HGB 9.3*  HCT 30.1*  MCV 89.3  PLT 438*    Cardiac Enzymes:  Recent Labs Lab 11/15/15 0524  TROPONINI 0.05*    BNP: BNP (last 3 results)  Recent Labs  10/01/15 1700 10/28/15 1723 11/15/15 0055  BNP 3232.1* 1406.5* 1669.5*    ProBNP (last 3 results) No results for input(s): PROBNP in the last 8760 hours.    Other results:  Imaging:  No results found.   Medications:     Scheduled Medications: . antiseptic oral rinse  7 mL Mouth Rinse BID  . aspirin EC  81 mg Oral BH-q7a  . atorvastatin  20 mg Oral Daily  . carvedilol  6.25 mg Oral BID WC  . enoxaparin (LOVENOX) injection  40 mg Subcutaneous Q24H  . feeding supplement (ENSURE ENLIVE)  237 mL Oral BID BM  . ferrous sulfate  325 mg Oral Q breakfast  . furosemide  80 mg Intravenous BID  . isosorbide mononitrate  30 mg Oral Daily  . levothyroxine  50 mcg Oral QAC breakfast  . lisinopril  20 mg Oral Daily  . mometasone-formoterol  2 puff Inhalation BID  .  potassium chloride  10 mEq Oral Daily  . potassium chloride  20 mEq Oral Once  . saccharomyces boulardii  250 mg Oral TID WC  . sertraline  25 mg Oral Daily  . sodium chloride flush  3 mL Intravenous Q12H     Infusions:     PRN Medications:  sodium chloride, acetaminophen, HYDROcodone-acetaminophen, ipratropium-albuterol, ondansetron (ZOFRAN) IV, oxymetazoline, sodium chloride flush   Assessment:   1. A/C Systolic Heart Failure . NICM cath 2016 cors ok- Medtronic ICD  2. Acute Respiratory Failure- Required BiPap on admit 3.Dementia  4. COPD 5. Hypothyroidism   Plan/Discussion:     Although her neck veins are hard to see, she appears to have significant residual edema on exam and ReDS lung water was high. IV lasix  restarted yesterday but minimal response. Renal function stable. We will add metolazone today.Daily BMET to watch electrolytes. Will need to work on getting daily weights and sliding scale diuretic arranged for her at home.     Length of Stay: 3   Bensimhon, Daniel MD 11/18/2015, 12:52 PM  Advanced Heart Failure Team Pager [phone] (M-F; 7a - 4p)  Please contact [facility] for night-coverage after hours (4p -7a ) and weekends on [email or web address]

## 2015-11-19 LAB — BASIC METABOLIC PANEL
Anion gap: 10 (ref 5–15)
BUN: 30 mg/dL — AB (ref 6–20)
CHLORIDE: 98 mmol/L — AB (ref 101–111)
CO2: 32 mmol/L (ref 22–32)
CREATININE: 1.22 mg/dL — AB (ref 0.44–1.00)
Calcium: 9.9 mg/dL (ref 8.9–10.3)
GFR calc Af Amer: 48 mL/min — ABNORMAL LOW (ref >60)
GFR calc non Af Amer: 41 mL/min — ABNORMAL LOW (ref >60)
Glucose, Bld: 80 mg/dL (ref 65–99)
POTASSIUM: 3.8 mmol/L (ref 3.5–5.1)
Sodium: 140 mmol/L (ref 135–145)

## 2015-11-19 MED ORDER — SODIUM CHLORIDE 0.9 % IV BOLUS (SEPSIS)
500.0000 mL | Freq: Once | INTRAVENOUS | Status: AC
  Administered 2015-11-19: 500 mL via INTRAVENOUS

## 2015-11-19 MED ORDER — TORSEMIDE 20 MG PO TABS: 40.0000 mg | ORAL_TABLET | Freq: Every day | ORAL | Status: DC

## 2015-11-19 MED ORDER — SODIUM CHLORIDE 0.9 % IV BOLUS (SEPSIS)
500.0000 mL | Freq: Once | INTRAVENOUS | Status: AC
Start: 2015-11-19 — End: 2015-11-19
  Administered 2015-11-19: 500 mL via INTRAVENOUS

## 2015-11-19 MED ORDER — HYDROCODONE-ACETAMINOPHEN 5-325 MG PO TABS: 1.0000 | ORAL_TABLET | Freq: Four times a day (QID) | ORAL | Status: DC | PRN

## 2015-11-19 MED ORDER — METOLAZONE 5 MG PO TABS
2.5000 mg | ORAL_TABLET | Freq: Once | ORAL | Status: AC
  Administered 2015-11-19: 2.5 mg via ORAL
  Filled 2015-11-19: qty 1

## 2015-11-19 MED ORDER — FUROSEMIDE 10 MG/ML IJ SOLN
80.0000 mg | Freq: Once | INTRAMUSCULAR | Status: AC
  Administered 2015-11-19: 80 mg via INTRAVENOUS
  Filled 2015-11-19: qty 8

## 2015-11-19 NOTE — Plan of Care (Signed)
Problem: Safety: Goal: Ability to remain free from injury will improve Outcome: Completed/Met Date Met:  11/19/15 Patient's call bell is within reach and bed alarm is on. Patient has not attempted to get out of bed without staff assistance. Patient has used her call bell to request bathroom assistance.   Problem: Pain Managment: Goal: General experience of comfort will improve Outcome: Completed/Met Date Met:  11/19/15 Patient has had no complaints of pain. Patient will alert RN if she develops pain.  Problem: Fluid Volume: Goal: Ability to maintain a balanced intake and output will improve Outcome: Progressing Patient is currently receiving IV diuresis; strict intake and output is being measured and recorded.   Problem: Bowel/Gastric: Goal: Will not experience complications related to bowel motility Outcome: Completed/Met Date Met:  11/19/15 Patient has a colostomy that she cares for at home. Patient demonstrates ability to care for her colostomy adequately. Bowel pattern has been normal for patient this admission.

## 2015-11-19 NOTE — Progress Notes (Signed)
We stood patient to do ReDS Vest reading and she complained of being dizzy.  Upon returning patient to bed her BP was 100/45.  Dr Haroldine Laws made aware and orders entered.  Her nurse is in the room and will continue to monitor patient.

## 2015-11-19 NOTE — Progress Notes (Signed)
ReDS Vest Discharge Study  Results of ReDS reading is 34  Your patient is ok for discharge.    Thank You   The research team

## 2015-11-19 NOTE — Progress Notes (Addendum)
Advanced Heart Failure Rounding Note   Subjective:   Yesterday she continued to diurese with IV lasix + metolazone. Weight down another pound.   Denies SOB/Orthopnea.   Objective:   Weight Range:  Vital Signs:   Temp:  [98.1 F (36.7 C)-98.4 F (36.9 C)] 98.2 F (36.8 C) (02/10 0402) Pulse Rate:  [76-80] 78 (02/10 0402) Resp:  [17] 17 (02/10 0402) BP: (95-132)/(45-59) 132/59 mmHg (02/10 0800) SpO2:  [97 %-100 %] 98 % (02/10 0855) Weight:  [162 lb 3.2 oz (73.573 kg)] 162 lb 3.2 oz (73.573 kg) (02/10 0402) Last BM Date: 11/18/15  Weight change: Filed Weights   11/17/15 0600 11/18/15 0550 11/19/15 0402  Weight: 161 lb 11.2 oz (73.347 kg) 163 lb 9.6 oz (74.208 kg) 162 lb 3.2 oz (73.573 kg)    Intake/Output:   Intake/Output Summary (Last 24 hours) at 11/19/15 1056 Last data filed at 11/19/15 0800  Gross per 24 hour  Intake   1080 ml  Output   2626 ml  Net  -1546 ml     Physical Exam: General: Well appearing. No resp difficulty. In bed HEENT: normal Neck: supple. JVP difficult to assess but does not seems elevated. Carotids 2+ bilat; no bruits. No lymphadenopathy or thryomegaly appreciated. Cor: PMI nondisplaced. Regular rate & rhythm. No rubs, gallops or murmurs. Lungs: clear Abdomen: soft, nontender, distended. No hepatosplenomegaly. No bruits or masses. Good bowel sounds. Extremities: no cyanosis, clubbing, rash, R and LLE trace-1+ edema Neuro: alert & orientedx3, cranial nerves grossly intact. moves all 4 extremities w/o difficulty. Affect pleasant. Poor memory.   Telemetry: Sinus Rhythm 80s  Labs: Basic Metabolic Panel:  Recent Labs Lab 11/15/15 0055 11/16/15 0711 11/17/15 0345 11/18/15 0624 11/19/15 0500  NA 142 142 142 141 140  K 3.9 3.6 3.9 3.9 3.8  CL 106 101 103 100* 98*  CO2 23 27 31 30  32  GLUCOSE 93 84 95 78 80  BUN 18 29* 32* 29* 30*  CREATININE 1.04* 1.00 1.06* 1.07* 1.22*  CALCIUM 9.7 9.5 9.6 9.7 9.9    Liver Function Tests: No  results for input(s): AST, ALT, ALKPHOS, BILITOT, PROT, ALBUMIN in the last 168 hours. No results for input(s): LIPASE, AMYLASE in the last 168 hours. No results for input(s): AMMONIA in the last 168 hours.  CBC:  Recent Labs Lab 11/15/15 0055  WBC 6.6  HGB 9.3*  HCT 30.1*  MCV 89.3  PLT 438*    Cardiac Enzymes:  Recent Labs Lab 11/15/15 0524  TROPONINI 0.05*    BNP: BNP (last 3 results)  Recent Labs  10/01/15 1700 10/28/15 1723 11/15/15 0055  BNP 3232.1* 1406.5* 1669.5*    ProBNP (last 3 results) No results for input(s): PROBNP in the last 8760 hours.    Other results:  Imaging: No results found.   Medications:     Scheduled Medications: . antiseptic oral rinse  7 mL Mouth Rinse BID  . aspirin EC  81 mg Oral BH-q7a  . atorvastatin  20 mg Oral Daily  . carvedilol  6.25 mg Oral BID WC  . enoxaparin (LOVENOX) injection  40 mg Subcutaneous Q24H  . feeding supplement (ENSURE ENLIVE)  237 mL Oral BID BM  . ferrous sulfate  325 mg Oral Q breakfast  . isosorbide mononitrate  30 mg Oral Daily  . levothyroxine  50 mcg Oral QAC breakfast  . lisinopril  20 mg Oral Daily  . mometasone-formoterol  2 puff Inhalation BID  . potassium chloride  10  mEq Oral Daily  . saccharomyces boulardii  250 mg Oral TID WC  . sertraline  25 mg Oral Daily  . sodium chloride flush  3 mL Intravenous Q12H    Infusions:    PRN Medications: sodium chloride, acetaminophen, HYDROcodone-acetaminophen, ipratropium-albuterol, ondansetron (ZOFRAN) IV, oxymetazoline, sodium chloride flush   Assessment:   1. A/C Systolic Heart Failure . NICM cath 2016 cors ok- Medtronic ICD  2. Acute Respiratory Failure- Required BiPap on admit 3.Dementia  4. COPD 5. Hypothyroidism   Plan/Discussion:    Volume status improved. Give one more dose of IV lasix + metolazone. Then tomorrow start 40 mg demadex once daily. Continue 10 meq potassium daily. Continue carvedilol and lisinopril at  current dose.   Should be able to discharge later today. She has follow up in the HF clinic next week.   HF meds for D/C  Carvedilol 6.25 mg twice a day Demadex 40 mg daily  Kdur 10 meq  Imdur 30 mg daily Lisinopril 20 mg daily   Length of Stay: 4   Amy Clegg NP-C 11/19/2015, 10:56 AM  Advanced Heart Failure Team Pager [phone] (M-F; 7a - 4p)  Please contact [facility] for night-coverage after hours (4p -7a ) and weekends on [email or web address]  Patient seen and examined with Darrick Grinder, NP. We discussed all aspects of the encounter. I agree with the assessment and plan as stated above.   Good urine output yesterday but weight only down one pound. Will give one more dose IV lasix today with metolazone and then likely can go back to ALF later today. Switch Lasix to demadex 40 daily. Will need f/u in HF Clinic and Paramedicine. Will check ReDS reading today.   Joanie Duprey,MD 11:28 AM

## 2015-11-19 NOTE — Progress Notes (Signed)
Pt discharging to [facility] via [facility]. VSS. Prescriptions with pt.

## 2015-11-19 NOTE — Progress Notes (Signed)
Pt transferred to: [facility] Anticipated date of transfer: 11/19/15 Transported by: Ambulance  Time Tentatively Scheduled for: 5:00 PM Family notified: Son [name] Nursing notified of planned d/c and DC summary/FL2 sent to facility for review. Return to facility approved per [place] at [facility].  Son is agreeable to return to facility;  Patient is alert and aware of d/c; has some confusion.  No further needs/ concerns identified.  CSW signing off.  Kendell Bane, LCSW [phone]

## 2015-11-19 NOTE — Progress Notes (Signed)
Physical Therapy Treatment Patient Details Name: [name] MRN: [id number] DOB: 23-Nov-1933 Today's Date: 12-14-15    History of Present Illness Pt is a 82 y/o F admitted from her [facility] due to dyspnea associated w/ LE edema.  Pt's PMH includes CHF, COPD, colon cancer, anginal pain, PE, hypothyroidism, colostomy, ankle fx surgery.    PT Comments    Pt symptomatic and BP remains low, therapeutic exercise performed in supine.  Follow Up Recommendations  SNF;Supervision/Assistance - 24 hour     Equipment Recommendations  None recommended by PT    Recommendations for Other Services       Precautions / Restrictions Precautions Precautions: Fall Restrictions Weight Bearing Restrictions: No    Mobility  Bed Mobility                  Transfers                    Ambulation/Gait                 Stairs            Wheelchair Mobility    Modified Rankin (Stroke Patients Only)       Balance                                    Cognition Arousal/Alertness: Awake/alert Behavior During Therapy: WFL for tasks assessed/performed Overall Cognitive Status: History of cognitive impairments - at baseline Area of Impairment: Memory   Current Attention Level: Selective Memory: Decreased short-term memory   Safety/Judgement: Decreased awareness of safety;Decreased awareness of deficits   Problem Solving: Slow processing;Requires verbal cues      Exercises General Exercises - Lower Extremity Ankle Circles/Pumps: AROM;Supine;15 reps Quad Sets: AROM;Supine;15 reps Gluteal Sets: AROM;Supine;15 reps Heel Slides: AROM;15 reps;Supine Hip ABduction/ADduction: AROM;Supine;15 reps Straight Leg Raises: AROM;15 reps;Supine    General Comments        Pertinent Vitals/Pain Pain Assessment: No/denies pain    Home Living                      Prior Function            PT Goals (current goals can now  be found in the care plan section) Acute Rehab PT Goals Patient Stated Goal: to be able to stand up on her own Potential to Achieve Goals: Good Progress towards PT goals: Progressing toward goals    Frequency  Min 2X/week    PT Plan Current plan remains appropriate    Co-evaluation             End of Session   Activity Tolerance: Treatment limited secondary to medical complications (Comment) (OOb mobility deferred due to low BP and pt symptomatic.)       Time: [id number] PT Time Calculation (min) (ACUTE ONLY): 17 min  Charges:  $Therapeutic Exercise: 8-22 mins                    G Codes:      Cristela Blue 12/14/15, 4:08 PM  Governor Rooks, PTA pager [phone]

## 2015-11-19 NOTE — Progress Notes (Signed)
   11/19/15 1315  PT Visit Information  Reason Eval/Treat Not Completed Medical issues which prohibited therapy (RN reports to hold therapy until after bolus is complete.  Will continue efforts if time permits.  )

## 2015-11-19 NOTE — Progress Notes (Signed)
Report given to RN [facility]. [facility].

## 2015-11-19 NOTE — Progress Notes (Signed)
Pt received 500cc bolus, pressures unchanged 97/45, pt reports stomach feels better but still a little dizzy. fine crackles in lung bases (unchanged from am assessment). CHF team paged. Will continue to monitor.

## 2015-11-19 NOTE — NC FL2 (Signed)
[place] MEDICAID FL2 LEVEL OF CARE SCREENING TOOL     IDENTIFICATION  Patient Name: [name] Birthdate: 06-10-1934 Sex: female Admission Date (Current Location): 11/15/2015  [facility] and [place] Number:  Herbalist and Address:  [facility], [place] [address], [place],  [address]      Provider Number: [id number]  Attending Physician Name and Address:  Jonetta Osgood, MD  Relative Name and Phone Number:       Current Level of Care: Hospital Recommended Level of Care: [place] Prior Approval Number:    Date Approved/Denied:   PASRR Number: [id number]  Discharge Plan: SNF    Current Diagnoses: Patient Active Problem List   Diagnosis Date Noted  . Essential hypertension 11/16/2015  . Hypothyroidism 11/16/2015  . Acute on chronic systolic (congestive) heart failure ([place]) 11/15/2015  . Hypertensive heart disease 10/04/2015  . Elevated troponin 10/04/2015  . Acute respiratory failure ([place])   . Acute systolic congestive heart failure ([place])   . NICM (nonischemic cardiomyopathy) ([place]) 08/11/2015  . Normal coronary arteries 08/11/2015  . Dyslipidemia 07/27/2015  . CKD (chronic kidney disease) stage 3, GFR 30-59 ml/min 07/27/2015  . Anemia of chronic disease 07/27/2015  . Benign essential HTN 07/27/2015  . Hypothyroidism, adult 07/27/2015  . FUO (fever of unknown origin) 07/27/2015  . IDA (iron deficiency anemia) 07/27/2015  . Chronic systolic CHF (congestive heart failure) ([place]) 07/27/2015  . Colon cancer ([place]) 07/27/2015  . Sepsis ([place]) 07/24/2015  . Acute encephalopathy 07/24/2015  . Hypokalemia 07/24/2015  . Dementia 07/23/2015  . COPD (chronic obstructive pulmonary disease) ([place]) 04/25/2015  . ICD in place 04/14/2015  . Colostomy in place [facility]) 03/01/2015    Orientation RESPIRATION BLADDER Height & Weight     Self, Time, Situation, Place  O2 (2L) Incontinent Weight: 162 lb 3.2 oz (73.573  kg) Height:  5\' 6"  (167.6 cm)  BEHAVIORAL SYMPTOMS/MOOD NEUROLOGICAL BOWEL NUTRITION STATUS      Colostomy (Patient manages herself) Diet (Heart Healthy with Thin Liquids)  AMBULATORY STATUS COMMUNICATION OF NEEDS Skin   Limited Assist Verbally Normal                       Personal Care Assistance Level of Assistance  Bathing, Feeding, Dressing Bathing Assistance: Limited assistance Feeding assistance: Independent Dressing Assistance: Limited assistance     Functional Limitations Info  Sight, Hearing, Speech Sight Info: Adequate Hearing Info: Adequate Speech Info: Adequate    SPECIAL CARE FACTORS FREQUENCY  PT (By licensed PT)     PT Frequency: 3              Contractures Contractures Info: Not present    Additional Factors Info  Code Status, Allergies Code Status Info: Full Code Allergies Info: No Known Allergies           Current Medications (11/19/2015):  This is the current hospital active medication list Current Facility-Administered Medications  Medication Dose Route Frequency Provider Last Rate Last Dose  . 0.9 %  sodium chloride infusion  250 mL Intravenous PRN Gennaro Africa, MD      . acetaminophen (TYLENOL) tablet 650 mg  650 mg Oral Q4H PRN Gennaro Africa, MD   650 mg at 11/15/15 1035  . antiseptic oral rinse (CPC / CETYLPYRIDINIUM CHLORIDE 0.05%) solution 7 mL  7 mL Mouth Rinse BID Jessica U Vann, DO   7 mL at 11/19/15 1000  . aspirin EC tablet 81 mg  81 mg Oral Marya Fossa, MD   81 mg at 11/19/15 [id number]  . atorvastatin (LIPITOR) tablet 20 mg  20 mg Oral Daily Gennaro Africa, MD   20 mg at 11/19/15 [id number]  . carvedilol (COREG) tablet 6.25 mg  6.25 mg Oral BID WC Gennaro Africa, MD   6.25 mg at 11/19/15 0824  . enoxaparin (LOVENOX) injection 40 mg  40 mg Subcutaneous Q24H Gennaro Africa, MD   40 mg at 11/19/15 [id number]  . feeding supplement (ENSURE ENLIVE) (ENSURE ENLIVE) liquid 237 mL  237 mL Oral BID BM Gennaro Africa, MD   237 mL at 11/19/15 1500  . ferrous  sulfate tablet 325 mg  325 mg Oral Q breakfast Gennaro Africa, MD   325 mg at 11/19/15 [id number]  . HYDROcodone-acetaminophen (NORCO/VICODIN) 5-325 MG per tablet 1-2 tablet  1-2 tablet Oral Q4H PRN Gennaro Africa, MD      . ipratropium-albuterol (DUONEB) 0.5-2.5 (3) MG/3ML nebulizer solution 3 mL  3 mL Nebulization Q4H PRN Delfina Redwood, MD      . isosorbide mononitrate (IMDUR) 24 hr tablet 30 mg  30 mg Oral Daily Gennaro Africa, MD   30 mg at 11/19/15 [id number]  . levothyroxine (SYNTHROID, LEVOTHROID) tablet 50 mcg  50 mcg Oral QAC breakfast Gennaro Africa, MD   50 mcg at 11/19/15 [phone]  . lisinopril (PRINIVIL,ZESTRIL) tablet 20 mg  20 mg Oral Daily Gennaro Africa, MD   20 mg at 11/19/15 [id number]  . mometasone-formoterol (DULERA) 200-5 MCG/ACT inhaler 2 puff  2 puff Inhalation BID Gennaro Africa, MD   2 puff at 11/19/15 [phone]  . ondansetron (ZOFRAN) injection 4 mg  4 mg Intravenous Q6H PRN Gennaro Africa, MD      . oxymetazoline (AFRIN) 0.05 % nasal spray 1 spray  1 spray Each Nare BID PRN Gennaro Africa, MD      . potassium chloride (K-DUR,KLOR-CON) CR tablet 10 mEq  10 mEq Oral Daily Gennaro Africa, MD   10 mEq at 11/19/15 0825  . saccharomyces boulardii (FLORASTOR) capsule 250 mg  250 mg Oral TID WC Gennaro Africa, MD   250 mg at 11/19/15 1605  . sertraline (ZOLOFT) tablet 25 mg  25 mg Oral Daily Gennaro Africa, MD   25 mg at 11/19/15 0825  . sodium chloride 0.9 % bolus 500 mL  500 mL Intravenous Once Amy D Clegg, NP   500 mL at 11/19/15 1600  . sodium chloride flush (NS) 0.9 % injection 3 mL  3 mL Intravenous Q12H Gennaro Africa, MD   3 mL at 11/19/15 1000  . sodium chloride flush (NS) 0.9 % injection 3 mL  3 mL Intravenous PRN Gennaro Africa, MD         Discharge Medications: DISCHARGE MEDICATIONS: Current Discharge Medication List    START taking these medications   Details  torsemide (DEMADEX) 20 MG tablet Take 2 tablets (40 mg total) by mouth daily.      CONTINUE these medications which have CHANGED   Details   HYDROcodone-acetaminophen (NORCO/VICODIN) 5-325 MG tablet Take 1-2 tablets by mouth every 6 (six) hours as needed for moderate pain. Qty: 30 tablet, Refills: 0      CONTINUE these medications which have NOT CHANGED   Details  acetaminophen (TYLENOL) 325 MG tablet Take 650 mg by mouth every 6 (six) hours as needed (pain).    ADVAIR DISKUS 500-50 MCG/DOSE AEPB Inhale 1 puff into the lungs every 12 (twelve) hours. Refills: 3    albuterol (PROVENTIL) (2.5 MG/3ML)  0.083% nebulizer solution Take 3 mLs (2.5 mg total) by nebulization every 4 (four) hours as needed for wheezing. Qty: 75 mL, Refills: 12    aspirin EC 81 MG tablet Take 1 tablet (81 mg total) by mouth every morning.    atorvastatin (LIPITOR) 20 MG tablet Take 20 mg by mouth daily.     carvedilol (COREG) 6.25 MG tablet Take 1 tablet (6.25 mg total) by mouth 2 (two) times daily with a meal.    feeding supplement, ENSURE ENLIVE, (ENSURE ENLIVE) LIQD Take 237 mLs by mouth 2 (two) times daily between meals. Qty: 237 mL, Refills: 12    ferrous sulfate 325 (65 FE) MG tablet Take 325 mg by mouth every morning.    isosorbide mononitrate (IMDUR) 30 MG 24 hr tablet Take 1 tablet (30 mg total) by mouth daily. Qty: 30 tablet, Refills: 0    lactobacilus acidophilus & bulgar (FLORANEX) TABS chewable tablet Take 1 tablet by mouth 3 (three) times daily with meals. Qty: 30 tablet, Refills: 0    levothyroxine (SYNTHROID, LEVOTHROID) 50 MCG tablet Take 50 mcg by mouth every morning.  Refills: 2    lisinopril (PRINIVIL,ZESTRIL) 20 MG tablet Take 20 mg by mouth daily.    oxymetazoline (AFRIN) 0.05 % nasal spray Place 1 spray into both nostrils 2 (two) times daily as needed for congestion.    Polyethyl Glycol-Propyl Glycol (SYSTANE OP) Place 1 drop into the left eye 2 (two) times daily.    potassium chloride (K-DUR) 10 MEQ tablet Take 1 tablet (10 mEq total) by mouth daily. Qty: 5 tablet,  Refills: 0    ranitidine (ZANTAC) 150 MG tablet Take 150 mg by mouth daily.  Refills: 11    sertraline (ZOLOFT) 25 MG tablet Take 25 mg by mouth daily.      STOP taking these medications     furosemide (LASIX) 40 MG tablet         ALLERGIES: No Known Allergies              Relevant Imaging Results:  Relevant Lab Results:   Additional Information SSN [id number]      [place] for PT/OT - Eval and treat. Please refer to DC summary for management orders of CHF.    Williemae Area, LCSW

## 2015-11-22 ENCOUNTER — Encounter (HOSPITAL_COMMUNITY): Payer: Self-pay | Admitting: Emergency Medicine

## 2015-11-22 ENCOUNTER — Emergency Department (HOSPITAL_COMMUNITY)
Admission: EM | Admit: 2015-11-22 | Discharge: 2015-11-22 | Disposition: A | Discharge status: Home / Self Care | Payer: Medicare HMO | Attending: Emergency Medicine | Admitting: Emergency Medicine

## 2015-11-22 ENCOUNTER — Emergency Department (HOSPITAL_COMMUNITY): Payer: Medicare HMO

## 2015-11-22 DIAGNOSIS — E119 Type 2 diabetes mellitus without complications: Secondary | ICD-10-CM | POA: Insufficient documentation

## 2015-11-22 DIAGNOSIS — Z79899 Other long term (current) drug therapy: Secondary | ICD-10-CM | POA: Diagnosis not present

## 2015-11-22 DIAGNOSIS — I209 Angina pectoris, unspecified: Secondary | ICD-10-CM | POA: Insufficient documentation

## 2015-11-22 DIAGNOSIS — I10 Essential (primary) hypertension: Secondary | ICD-10-CM | POA: Diagnosis not present

## 2015-11-22 DIAGNOSIS — Z8701 Personal history of pneumonia (recurrent): Secondary | ICD-10-CM | POA: Diagnosis not present

## 2015-11-22 DIAGNOSIS — I509 Heart failure, unspecified: Secondary | ICD-10-CM | POA: Insufficient documentation

## 2015-11-22 DIAGNOSIS — J449 Chronic obstructive pulmonary disease, unspecified: Secondary | ICD-10-CM | POA: Insufficient documentation

## 2015-11-22 DIAGNOSIS — Z86711 Personal history of pulmonary embolism: Secondary | ICD-10-CM | POA: Diagnosis not present

## 2015-11-22 DIAGNOSIS — R112 Nausea with vomiting, unspecified: Secondary | ICD-10-CM | POA: Diagnosis present

## 2015-11-22 DIAGNOSIS — Z87448 Personal history of other diseases of urinary system: Secondary | ICD-10-CM | POA: Insufficient documentation

## 2015-11-22 DIAGNOSIS — R011 Cardiac murmur, unspecified: Secondary | ICD-10-CM | POA: Insufficient documentation

## 2015-11-22 DIAGNOSIS — E785 Hyperlipidemia, unspecified: Secondary | ICD-10-CM | POA: Insufficient documentation

## 2015-11-22 DIAGNOSIS — E039 Hypothyroidism, unspecified: Secondary | ICD-10-CM | POA: Diagnosis not present

## 2015-11-22 DIAGNOSIS — Z9889 Other specified postprocedural states: Secondary | ICD-10-CM | POA: Insufficient documentation

## 2015-11-22 DIAGNOSIS — M1990 Unspecified osteoarthritis, unspecified site: Secondary | ICD-10-CM | POA: Diagnosis not present

## 2015-11-22 DIAGNOSIS — Z85038 Personal history of other malignant neoplasm of large intestine: Secondary | ICD-10-CM | POA: Diagnosis not present

## 2015-11-22 DIAGNOSIS — Z7982 Long term (current) use of aspirin: Secondary | ICD-10-CM | POA: Insufficient documentation

## 2015-11-22 LAB — COMPREHENSIVE METABOLIC PANEL
ALBUMIN: 3.7 g/dL (ref 3.5–5.0)
ALK PHOS: 52 U/L (ref 38–126)
ALT: 11 U/L — ABNORMAL LOW (ref 14–54)
AST: 17 U/L (ref 15–41)
Anion gap: 11 (ref 5–15)
BILIRUBIN TOTAL: 0.6 mg/dL (ref 0.3–1.2)
BUN: 46 mg/dL — AB (ref 6–20)
CALCIUM: 9.8 mg/dL (ref 8.9–10.3)
CO2: 28 mmol/L (ref 22–32)
Chloride: 101 mmol/L (ref 101–111)
Creatinine, Ser: 1.52 mg/dL — ABNORMAL HIGH (ref 0.44–1.00)
GFR calc Af Amer: 36 mL/min — ABNORMAL LOW (ref >60)
GFR calc non Af Amer: 31 mL/min — ABNORMAL LOW (ref >60)
GLUCOSE: 115 mg/dL — AB (ref 65–99)
POTASSIUM: 4 mmol/L (ref 3.5–5.1)
SODIUM: 140 mmol/L (ref 135–145)
TOTAL PROTEIN: 7.5 g/dL (ref 6.5–8.1)

## 2015-11-22 LAB — CBC
HCT: 31.9 % — ABNORMAL LOW (ref 36.0–46.0)
HEMOGLOBIN: 9.7 g/dL — AB (ref 12.0–15.0)
MCH: 27.6 pg (ref 26.0–34.0)
MCHC: 30.4 g/dL (ref 30.0–36.0)
MCV: 90.9 fL (ref 78.0–100.0)
PLATELETS: 370 10*3/uL (ref 150–400)
RBC: 3.51 MIL/uL — ABNORMAL LOW (ref 3.87–5.11)
RDW: 17.6 % — AB (ref 11.5–15.5)
WBC: 5.1 10*3/uL (ref 4.0–10.5)

## 2015-11-22 LAB — URINALYSIS, ROUTINE W REFLEX MICROSCOPIC
Bilirubin Urine: NEGATIVE
GLUCOSE, UA: NEGATIVE mg/dL
HGB URINE DIPSTICK: NEGATIVE
KETONES UR: NEGATIVE mg/dL
Nitrite: NEGATIVE
PROTEIN: NEGATIVE mg/dL
Specific Gravity, Urine: 1.013 (ref 1.005–1.030)
pH: 7 (ref 5.0–8.0)

## 2015-11-22 LAB — URINE MICROSCOPIC-ADD ON: RBC / HPF: NONE SEEN RBC/hpf (ref 0–5)

## 2015-11-22 LAB — I-STAT CG4 LACTIC ACID, ED: Lactic Acid, Venous: 1.31 mmol/L (ref 0.5–2.0)

## 2015-11-22 LAB — I-STAT TROPONIN, ED: Troponin i, poc: 0.01 ng/mL (ref 0.00–0.08)

## 2015-11-22 LAB — BRAIN NATRIURETIC PEPTIDE: B Natriuretic Peptide: 173.9 pg/mL — ABNORMAL HIGH (ref 0.0–100.0)

## 2015-11-22 LAB — LIPASE, BLOOD: Lipase: 53 U/L — ABNORMAL HIGH (ref 11–51)

## 2015-11-22 MED ORDER — SODIUM CHLORIDE 0.9 % IV BOLUS (SEPSIS)
500.0000 mL | Freq: Once | INTRAVENOUS | Status: AC
  Administered 2015-11-22: 500 mL via INTRAVENOUS

## 2015-11-22 MED ORDER — ONDANSETRON HCL 4 MG/2ML IJ SOLN: 4.0000 mg | Freq: Once | INTRAMUSCULAR | Status: DC

## 2015-11-22 NOTE — Progress Notes (Addendum)
Pt confirms she is at [facility] pcp Pt confirms she vomited during her devotional service this morning states she vomited up her breakfast Requesting lunch Discussed this with CNA  Alert to person, Place and time

## 2015-11-22 NOTE — ED Provider Notes (Signed)
CSN: [id number]     Arrival date & time 11/22/15  1034 History   First MD Initiated Contact with Patient 11/22/15 1057     Chief Complaint  Patient presents with  . Emesis  . Hypotension     (Consider location/radiation/quality/duration/timing/severity/associated sxs/prior Treatment) HPI Comments: 82 year old female with extensive past medical history including CHF, COPD, PE, type 2 diabetes who presents with vomiting. Patient states she was in her devotional group today around 9am when she began feeling hot and nauseated. She had several episodes of vomiting. She denies any abdominal pain, chest pain, shortness of breath, urinary symptoms, fevers, or any pain. Of note, she was discharged from the hospital recently after admission for CHF exacerbation. No cough/cold symptoms.  Patient is a 82 y.o. female presenting with vomiting. The history is provided by the patient.  Emesis   Past Medical History  Diagnosis Date  . Hypertension   . Asthma   . CHF (congestive heart failure) ([place])   . COPD (chronic obstructive pulmonary disease) ([place])   . Colon cancer ([place])   . Hyperlipidemia   . Heart murmur   . Anginal pain ([place])   . Pulmonary embolism ([place])   . Pneumonia     "just once" (03/01/2015)  . Hypothyroidism   . History of blood transfusion     "related to anemia, this is my 1st" (03/01/2015)  . Arthritis     "comes and goes" (03/01/2015)  . Renal disorder   . Colostomy care ([place])   . Diabetes mellitus without complication ([place])     pt denies this hx on 03/01/2015   Past Surgical History  Procedure Laterality Date  . Cholecystectomy    . Colon surgery      cancer  . Fracture surgery    . Dilation and curettage of uterus    . Tubal ligation    . Cataract extraction w/ intraocular lens  implant, bilateral Bilateral   . Ankle fracture surgery Left   . Abdominal hysterectomy    . Cardiac catheterization N/A 03/22/2015    Procedure: Right/Left Heart Cath and Coronary  Angiography;  Surgeon: Leonie Man, MD;  Location: [facility];  Service: Cardiovascular;  Laterality: N/A;   Family History  Problem Relation Age of Onset  . CAD    . Hypertension    . Stroke    . Colon cancer Neg Hx   . GI Bleed Neg Hx    Social History  Substance Use Topics  . Smoking status: Never Smoker   . Smokeless tobacco: Never Used  . Alcohol Use: No   OB History    No data available     Review of Systems  Gastrointestinal: Positive for vomiting.   10 Systems reviewed and are negative for acute change except as noted in the HPI.    Allergies  Review of patient's allergies indicates no known allergies.  Home Medications   Prior to Admission medications   Medication Sig Start Date End Date Taking? Authorizing Provider  acetaminophen (TYLENOL) 325 MG tablet Take 650 mg by mouth every 6 (six) hours as needed (pain).   Yes Historical Provider, MD  ADVAIR DISKUS 500-50 MCG/DOSE AEPB Inhale 1 puff into the lungs every 12 (twelve) hours. 12/28/14  Yes Historical Provider, MD  albuterol (PROVENTIL) (2.5 MG/3ML) 0.083% nebulizer solution Take 3 mLs (2.5 mg total) by nebulization every 4 (four) hours as needed for wheezing. 04/28/15  Yes Geradine Girt, DO  aspirin EC 81 MG tablet  Take 1 tablet (81 mg total) by mouth every morning. 03/03/15  Yes Ripudeep K Rai, MD  atorvastatin (LIPITOR) 20 MG tablet Take 20 mg by mouth daily.    Yes Historical Provider, MD  carvedilol (COREG) 6.25 MG tablet Take 1 tablet (6.25 mg total) by mouth 2 (two) times daily with a meal. 03/04/15  Yes Geradine Girt, DO  feeding supplement, ENSURE ENLIVE, (ENSURE ENLIVE) LIQD Take 237 mLs by mouth 2 (two) times daily between meals. 03/02/15  Yes Ripudeep Krystal Eaton, MD  ferrous sulfate 325 (65 FE) MG tablet Take 325 mg by mouth every morning.   Yes Historical Provider, MD  furosemide (LASIX) 20 MG tablet Take 60 mg by mouth 3 (three) times a week. Mon, Wednesday, Friday   Yes Historical Provider, MD   furosemide (LASIX) 40 MG tablet Take 40 mg by mouth 3 (three) times a week. Tues,thur,sat   Yes Historical Provider, MD  HYDROcodone-acetaminophen (NORCO/VICODIN) 5-325 MG tablet Take 1-2 tablets by mouth every 6 (six) hours as needed for moderate pain. Patient taking differently: Take 1 tablet by mouth every 4 (four) hours as needed for moderate pain.  11/19/15  Yes Shanker Kristeen Mans, MD  isosorbide mononitrate (IMDUR) 30 MG 24 hr tablet Take 1 tablet (30 mg total) by mouth daily. 03/24/15  Yes Nishant Dhungel, MD  lactobacilus acidophilus & bulgar (FLORANEX) TABS chewable tablet Take 1 tablet by mouth 3 (three) times daily with meals. 07/29/15  Yes Florencia Reasons, MD  levothyroxine (SYNTHROID, LEVOTHROID) 50 MCG tablet Take 50 mcg by mouth every morning.  01/16/15  Yes Historical Provider, MD  lisinopril (PRINIVIL,ZESTRIL) 20 MG tablet Take 20 mg by mouth daily.   Yes Historical Provider, MD  oxymetazoline (AFRIN) 0.05 % nasal spray Place 1 spray into both nostrils 2 (two) times daily as needed for congestion.   Yes Historical Provider, MD  Polyethyl Glycol-Propyl Glycol (SYSTANE OP) Place 1 drop into the left eye 2 (two) times daily.   Yes Historical Provider, MD  potassium chloride (K-DUR) 10 MEQ tablet Take 1 tablet (10 mEq total) by mouth daily. 10/28/15  Yes Noemi Chapel, MD  ranitidine (ZANTAC) 150 MG tablet Take 150 mg by mouth daily.  01/14/15  Yes Historical Provider, MD  sertraline (ZOLOFT) 25 MG tablet Take 25 mg by mouth daily.   Yes Historical Provider, MD   BP 129/56 mmHg  Pulse 74  Temp(Src) 98.3 F (36.8 C) (Oral)  Resp 19  SpO2 97% Physical Exam  Constitutional: She is oriented to person, place, and time. She appears well-developed and well-nourished. No distress.  Frail, elderly woman, awake and alert  HENT:  Head: Normocephalic and atraumatic.  Moist mucous membranes  Eyes: Conjunctivae are normal. Pupils are equal, round, and reactive to light.  Neck: Neck supple.   Cardiovascular: Normal rate, regular rhythm and normal heart sounds.   No murmur heard. Pulmonary/Chest: Effort normal and breath sounds normal.  Abdominal: Soft. Bowel sounds are normal. She exhibits no distension. There is no tenderness.  Musculoskeletal: She exhibits no edema.  Neurological: She is alert and oriented to person, place, and time.  Fluent speech  Skin: Skin is warm and dry.  Psychiatric: She has a normal mood and affect. Judgment normal.  Nursing note and vitals reviewed.   ED Course  Procedures (including critical care time) Labs Review Labs Reviewed  LIPASE, BLOOD - Abnormal; Notable for the following:    Lipase 53 (*)    All other components within normal limits  COMPREHENSIVE METABOLIC PANEL - Abnormal; Notable for the following:    Glucose, Bld 115 (*)    BUN 46 (*)    Creatinine, Ser 1.52 (*)    ALT 11 (*)    GFR calc non Af Amer 31 (*)    GFR calc Af Amer 36 (*)    All other components within normal limits  CBC - Abnormal; Notable for the following:    RBC 3.51 (*)    Hemoglobin 9.7 (*)    HCT 31.9 (*)    RDW 17.6 (*)    All other components within normal limits  URINALYSIS, ROUTINE W REFLEX MICROSCOPIC (NOT AT [facility]) - Abnormal; Notable for the following:    Leukocytes, UA TRACE (*)    All other components within normal limits  BRAIN NATRIURETIC PEPTIDE - Abnormal; Notable for the following:    B Natriuretic Peptide 173.9 (*)    All other components within normal limits  URINE MICROSCOPIC-ADD ON - Abnormal; Notable for the following:    Squamous Epithelial / LPF 0-5 (*)    Bacteria, UA RARE (*)    All other components within normal limits  I-STAT TROPOININ, ED  I-STAT CG4 LACTIC ACID, ED    Imaging Review Dg Chest 2 View  11/22/2015  CLINICAL DATA:  New onset of nausea, vomiting, and weakness. EXAM: CHEST  2 VIEW COMPARISON:  Chest x-rays dated 11/15/2015, 08/23/2015 and chest CT dated 07/26/2015 FINDINGS: There is chronic slight  cardiomegaly. AICD in place. Pulmonary vascularity is normal and the lungs are clear. No significant osseous abnormality. IMPRESSION: No acute abnormality.  Chronic slight cardiomegaly. Electronically Signed   By: Lorriane Shire M.D.   On: 11/22/2015 11:44   I have personally reviewed and evaluated these images and lab results as part of my medical decision-making.   EKG Interpretation   Date/Time:  Monday November 22 2015 11:11:17 EST Ventricular Rate:  65 PR Interval:  226 QRS Duration: 169 QT Interval:  493 QTC Calculation: 513 R Axis:   113 Text Interpretation:  Atrial-sensed ventricular-paced rhythm No further  analysis attempted due to paced rhythm reversed axis of precordial leads  ST changes in III, aVL similar to previous EKG Confirmed by LITTLE MD,  RACHEL [id number]) on 11/22/2015 11:26:23 AM     Medications  ondansetron (ZOFRAN) injection 4 mg (0 mg Intravenous Hold 11/22/15 1139)  sodium chloride 0.9 % bolus 500 mL (0 mLs Intravenous Stopped 11/22/15 1544)    MDM   Final diagnoses:  Non-intractable vomiting with nausea, vomiting of unspecified type   patient presents with several episodes of vomiting that began earlier today. On arrival by EMS, she was awake, alert, and in no acute distress. Vital signs unremarkable. No abdominal tenderness on exam. Normal work of breathing. EKG shows paced rhythm. Gave the patient Zofran and obtained above labs including troponin, lactate, and a BNP. Chest x-ray negative acute.  Troponin unremarkable. Creatinine 1.52 which is slightly worse than the patient's baseline and BNP 173. The patient was initially hypotensive when she arrived but after a fluid bolus returned to normal blood pressure and was able to ambulate without difficulty. Based on her mild AK I and normal BNP, she may have been mildly over diuresed. She has been able to eat and drink here with no episodes of vomiting. On reexamination, she continues to deny any complaints. I  discussed supportive care instructions as well as return precautions including abdominal pain, chest pain, or intractable vomiting. Patient voiced understanding and was discharged  in satisfactory condition.  Sharlett Iles, MD 11/22/15 [id number]

## 2015-11-22 NOTE — ED Notes (Signed)
PTAR called @ 15:50

## 2015-11-22 NOTE — ED Notes (Signed)
Bed: WA03 Expected date:  Expected time:  Means of arrival:  Comments: EMS- elderly, n/v

## 2015-11-22 NOTE — ED Notes (Signed)
Gave pt a microwave chicken dinner and a coke.

## 2015-11-22 NOTE — Discharge Instructions (Signed)

## 2015-11-22 NOTE — ED Notes (Signed)
Per EMS pt from [facility] for weakness, "hot feeling," nausea and several episodes vomiting onset today after breakfast. Pt in hospital last week for CHF complications. Pt A & O x 4.

## 2015-11-22 NOTE — ED Notes (Signed)
Pt was walked about 40 feet the patient a little unsteady

## 2015-11-25 ENCOUNTER — Ambulatory Visit (HOSPITAL_COMMUNITY)
Admit: 2015-11-25 | Discharge: 2015-11-25 | Disposition: A | Discharge status: Home / Self Care | Payer: Medicare HMO | Source: Ambulatory Visit | Attending: Internal Medicine | Admitting: Internal Medicine

## 2015-11-25 VITALS — BP 102/52 | HR 84 | Wt 158.8 lb

## 2015-11-25 DIAGNOSIS — F0390 Unspecified dementia without behavioral disturbance: Secondary | ICD-10-CM | POA: Diagnosis not present

## 2015-11-25 DIAGNOSIS — Z85038 Personal history of other malignant neoplasm of large intestine: Secondary | ICD-10-CM | POA: Insufficient documentation

## 2015-11-25 DIAGNOSIS — I130 Hypertensive heart and chronic kidney disease with heart failure and stage 1 through stage 4 chronic kidney disease, or unspecified chronic kidney disease: Secondary | ICD-10-CM | POA: Insufficient documentation

## 2015-11-25 DIAGNOSIS — I428 Other cardiomyopathies: Secondary | ICD-10-CM | POA: Insufficient documentation

## 2015-11-25 DIAGNOSIS — Z95810 Presence of automatic (implantable) cardiac defibrillator: Secondary | ICD-10-CM | POA: Diagnosis not present

## 2015-11-25 DIAGNOSIS — Z86711 Personal history of pulmonary embolism: Secondary | ICD-10-CM | POA: Insufficient documentation

## 2015-11-25 DIAGNOSIS — E785 Hyperlipidemia, unspecified: Secondary | ICD-10-CM | POA: Insufficient documentation

## 2015-11-25 DIAGNOSIS — J45909 Unspecified asthma, uncomplicated: Secondary | ICD-10-CM | POA: Insufficient documentation

## 2015-11-25 DIAGNOSIS — J449 Chronic obstructive pulmonary disease, unspecified: Secondary | ICD-10-CM | POA: Diagnosis not present

## 2015-11-25 DIAGNOSIS — E039 Hypothyroidism, unspecified: Secondary | ICD-10-CM | POA: Insufficient documentation

## 2015-11-25 DIAGNOSIS — Z79899 Other long term (current) drug therapy: Secondary | ICD-10-CM | POA: Diagnosis not present

## 2015-11-25 DIAGNOSIS — N183 Chronic kidney disease, stage 3 unspecified: Secondary | ICD-10-CM

## 2015-11-25 DIAGNOSIS — I429 Cardiomyopathy, unspecified: Secondary | ICD-10-CM | POA: Diagnosis not present

## 2015-11-25 DIAGNOSIS — I5022 Chronic systolic (congestive) heart failure: Secondary | ICD-10-CM | POA: Insufficient documentation

## 2015-11-25 DIAGNOSIS — Z7982 Long term (current) use of aspirin: Secondary | ICD-10-CM | POA: Diagnosis not present

## 2015-11-25 NOTE — Progress Notes (Signed)
REDS VEST READING= 31 CHEST RULER=15  VEST FITTING TASKS: POSTURE=sittiing HEIGHT MARKER=T CENTER STRIP=aligined   COMMENTS:.

## 2015-11-25 NOTE — Progress Notes (Signed)
Patient ID: SARISHA LAURIN, female   DOB: December 23, 1933, 82 y.o.   MRN: [id number] PCP: Primary Cardiologist: Dr Haroldine Laws   HPI: Lavonna Hughesis a 82 y.o. 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   Admitted earlier this month with increased and leg edema. Diuresed with IV lasix and transitioned to torsemide. Red Vest went down from 43 to 27. Diuretics stopped on discharge. Discharge weight was 162 pounds.   Evaluated [facility] with nausea/hypotension on February 13th. Received IV fluids and discharged.   She returns for post hospital follow up. Overall feeling ok. Denies SOB/PND/Orthopnea. Denies dizziness. No medication list provided for todays visit. She has no idea which medications she is taking. Taking all medications. Able to participate with therapy. Lives at [facility].   Labs 11/22/2015: K 4.0 Creatinine 1.52   ROS: All systems negative except as listed in HPI, PMH and Problem List.  SH:  Social History   Social History  . Marital Status: Divorced    Spouse Name: N/A  . Number of Children: N/A  . Years of Education: N/A   Occupational History  . Retired Psychologist, counselling    Social History Main Topics  . Smoking status: Never Smoker   . Smokeless tobacco: Never Used  . Alcohol Use: No  . Drug Use: No  . Sexual Activity: No   Other Topics Concern  . Not on file   Social History Narrative    FH:  Family History  Problem Relation Age of Onset  . CAD    . Hypertension    . Stroke    . Colon cancer Neg Hx   . GI Bleed Neg Hx     Past Medical History  Diagnosis Date  . Hypertension   . Asthma   . CHF (congestive heart failure) ([place])   . COPD (chronic obstructive pulmonary disease) ([place])   . Colon cancer ([place])   . Hyperlipidemia   . Heart murmur   .  Anginal pain ([place])   . Pulmonary embolism ([place])   . Pneumonia     "just once" (03/01/2015)  . Hypothyroidism   . History of blood transfusion     "related to anemia, this is my 1st" (03/01/2015)  . Arthritis     "comes and goes" (03/01/2015)  . Renal disorder   . Colostomy care ([place])   . Diabetes mellitus without complication ([place])     pt denies this hx on 03/01/2015    Current Outpatient Prescriptions  Medication Sig Dispense Refill  . acetaminophen (TYLENOL) 325 MG tablet Take 650 mg by mouth every 6 (six) hours as needed (pain).    . ADVAIR DISKUS 500-50 MCG/DOSE AEPB Inhale 1 puff into the lungs every 12 (twelve) hours.  3  . albuterol (PROVENTIL) (2.5 MG/3ML) 0.083% nebulizer solution Take 3 mLs (2.5 mg total) by nebulization every 4 (four) hours as needed for wheezing. 75 mL 12  . aspirin EC 81 MG tablet Take 1 tablet (81 mg total) by mouth every morning.    Marland Kitchen atorvastatin (LIPITOR) 20 MG tablet Take 20 mg by mouth daily.     . carvedilol (COREG) 6.25 MG tablet Take 1 tablet (6.25 mg total) by mouth 2 (two) times daily with a meal.    . feeding supplement, ENSURE ENLIVE, (ENSURE ENLIVE) LIQD Take 237  mLs by mouth 2 (two) times daily between meals. 237 mL 12  . ferrous sulfate 325 (65 FE) MG tablet Take 325 mg by mouth every morning.    . furosemide (LASIX) 20 MG tablet Take 60 mg by mouth 3 (three) times a week. Mon, Wednesday, Friday    . furosemide (LASIX) 40 MG tablet Take 40 mg by mouth 3 (three) times a week. Tues,thur,sat    . HYDROcodone-acetaminophen (NORCO/VICODIN) 5-325 MG tablet Take 1-2 tablets by mouth every 6 (six) hours as needed for moderate pain. (Patient taking differently: Take 1 tablet by mouth every 4 (four) hours as needed for moderate pain. ) 30 tablet 0  . isosorbide mononitrate (IMDUR) 30 MG 24 hr tablet Take 1 tablet (30 mg total) by mouth daily. 30 tablet 0  . lactobacilus acidophilus & bulgar (FLORANEX) TABS chewable tablet Take 1 tablet by mouth 3 (three)  times daily with meals. 30 tablet 0  . levothyroxine (SYNTHROID, LEVOTHROID) 50 MCG tablet Take 50 mcg by mouth every morning.   2  . lisinopril (PRINIVIL,ZESTRIL) 20 MG tablet Take 20 mg by mouth daily.    Marland Kitchen oxymetazoline (AFRIN) 0.05 % nasal spray Place 1 spray into both nostrils 2 (two) times daily as needed for congestion.    Vladimir Faster Glycol-Propyl Glycol (SYSTANE OP) Place 1 drop into the left eye 2 (two) times daily.    . potassium chloride (K-DUR) 10 MEQ tablet Take 1 tablet (10 mEq total) by mouth daily. 5 tablet 0  . ranitidine (ZANTAC) 150 MG tablet Take 150 mg by mouth daily.   11  . sertraline (ZOLOFT) 25 MG tablet Take 25 mg by mouth daily.     No current facility-administered medications for this encounter.    Filed Vitals:   11/25/15 1030  BP: 102/52  Pulse: 84  Weight: 158 lb 12.8 oz (72.031 kg)  SpO2: 99%    PHYSICAL EXAM:  General:  Well appearing. No resp difficulty. In wheel chair. Care giver present  HEENT: normal Neck: supple. JVP flat. Carotids 2+ bilaterally; no bruits. No lymphadenopathy or thryomegaly appreciated. Cor: PMI normal. Regular rate & rhythm. No rubs, gallops or murmurs. Lungs: clear Abdomen: soft, nontender, nondistended. No hepatosplenomegaly. No bruits or masses. Good bowel sounds. Extremities: no cyanosis, clubbing, rash, edema Neuro: alert & orientedx3, cranial nerves grossly intact. Moves all 4 extremities w/o difficulty. Affect pleasant.     ASSESSMENT & PLAN: 1. Chronic Systolic Heart Failure. NICM Medtronic ICD. 11/16/2015 ECHO EF 35-40% Grade I DD Volume status low. Reds Vest 31.  No meds changes as I was not provided with med sheet. Called for medication list from facility but it was not provided.  2. Dementia 3. COPD 4. Hypothroidism.  5. CKD III-  I have provided SNF HF order sheet.  Follow up in 4 weeks.   Amy Clegg NP-C  4:33 PM

## 2015-11-25 NOTE — Patient Instructions (Signed)
Your physician recommends that you schedule a follow-up appointment in: 4 weeks  Do the following things EVERYDAY: 1) Weigh yourself in the morning before breakfast. Write it down and keep it in a log. 2) Take your medicines as prescribed 3) Eat low salt foods-Limit salt (sodium) to 2000 mg per day.  4) Stay as active as you can everyday 5) Limit all fluids for the day to less than 2 liters 6)

## 2015-11-25 NOTE — Progress Notes (Signed)
Advanced Heart Failure Medication Review by a Pharmacist  Does the patient  feel that his/her medications are working for him/her?  yes  Has the patient been experiencing any side effects to the medications prescribed?  no  Does the patient measure his/her own blood pressure or blood glucose at home?  yes   Does the patient have any problems obtaining medications due to transportation or finances?   no  Understanding of regimen: good Understanding of indications: good Potential of compliance: good Patient understands to avoid NSAIDs. Patient understands to avoid decongestants.  Issues to address at subsequent visits: None   Pharmacist comments:  [name] is a pleasant 82 yo F presenting from [facility] without a medication list. I have called them to fax over a list and will reconcile our list once it is received.   Ruta Hinds. Velva Harman, PharmD, BCPS, CPP Clinical Pharmacist Pager: [phone] Phone: [phone] 11/25/2015 10:49 AM      Time with patient: 4 minutes Preparation and documentation time: 10 minutes Total time: 14 minutes

## 2015-11-30 ENCOUNTER — Telehealth (HOSPITAL_COMMUNITY): Payer: Self-pay | Admitting: *Deleted

## 2015-11-30 NOTE — Telephone Encounter (Signed)
Katie with [facility] called ans stated pts weight is up. Order faxed to [facility] for pt to have extra 40mg  of Lasix for two days. Order to weigh daily also faxed. Pt scheduled for follow up 3/16

## 2015-12-13 ENCOUNTER — Encounter (HOSPITAL_COMMUNITY): Payer: Self-pay | Admitting: Emergency Medicine

## 2015-12-13 ENCOUNTER — Emergency Department (HOSPITAL_COMMUNITY): Payer: Medicare HMO

## 2015-12-13 ENCOUNTER — Emergency Department (HOSPITAL_COMMUNITY)
Admission: EM | Admit: 2015-12-13 | Discharge: 2015-12-13 | Disposition: A | Discharge status: Home / Self Care | Payer: Medicare HMO | Attending: Emergency Medicine | Admitting: Emergency Medicine

## 2015-12-13 DIAGNOSIS — Z7982 Long term (current) use of aspirin: Secondary | ICD-10-CM | POA: Diagnosis not present

## 2015-12-13 DIAGNOSIS — M1990 Unspecified osteoarthritis, unspecified site: Secondary | ICD-10-CM | POA: Diagnosis not present

## 2015-12-13 DIAGNOSIS — Z9851 Tubal ligation status: Secondary | ICD-10-CM | POA: Insufficient documentation

## 2015-12-13 DIAGNOSIS — Z90710 Acquired absence of both cervix and uterus: Secondary | ICD-10-CM | POA: Diagnosis not present

## 2015-12-13 DIAGNOSIS — E039 Hypothyroidism, unspecified: Secondary | ICD-10-CM | POA: Diagnosis not present

## 2015-12-13 DIAGNOSIS — Z9889 Other specified postprocedural states: Secondary | ICD-10-CM | POA: Diagnosis not present

## 2015-12-13 DIAGNOSIS — R197 Diarrhea, unspecified: Secondary | ICD-10-CM | POA: Insufficient documentation

## 2015-12-13 DIAGNOSIS — Z86711 Personal history of pulmonary embolism: Secondary | ICD-10-CM | POA: Diagnosis not present

## 2015-12-13 DIAGNOSIS — R1084 Generalized abdominal pain: Secondary | ICD-10-CM | POA: Insufficient documentation

## 2015-12-13 DIAGNOSIS — Z8701 Personal history of pneumonia (recurrent): Secondary | ICD-10-CM | POA: Diagnosis not present

## 2015-12-13 DIAGNOSIS — E785 Hyperlipidemia, unspecified: Secondary | ICD-10-CM | POA: Insufficient documentation

## 2015-12-13 DIAGNOSIS — J449 Chronic obstructive pulmonary disease, unspecified: Secondary | ICD-10-CM | POA: Diagnosis not present

## 2015-12-13 DIAGNOSIS — I209 Angina pectoris, unspecified: Secondary | ICD-10-CM | POA: Insufficient documentation

## 2015-12-13 DIAGNOSIS — R011 Cardiac murmur, unspecified: Secondary | ICD-10-CM | POA: Insufficient documentation

## 2015-12-13 DIAGNOSIS — Z9049 Acquired absence of other specified parts of digestive tract: Secondary | ICD-10-CM | POA: Insufficient documentation

## 2015-12-13 DIAGNOSIS — Z79899 Other long term (current) drug therapy: Secondary | ICD-10-CM | POA: Insufficient documentation

## 2015-12-13 DIAGNOSIS — R1110 Vomiting, unspecified: Secondary | ICD-10-CM

## 2015-12-13 DIAGNOSIS — Z87448 Personal history of other diseases of urinary system: Secondary | ICD-10-CM | POA: Insufficient documentation

## 2015-12-13 DIAGNOSIS — I509 Heart failure, unspecified: Secondary | ICD-10-CM | POA: Diagnosis not present

## 2015-12-13 DIAGNOSIS — I10 Essential (primary) hypertension: Secondary | ICD-10-CM | POA: Diagnosis not present

## 2015-12-13 LAB — URINALYSIS, ROUTINE W REFLEX MICROSCOPIC
Bilirubin Urine: NEGATIVE
Glucose, UA: NEGATIVE mg/dL
Ketones, ur: NEGATIVE mg/dL
LEUKOCYTES UA: NEGATIVE
NITRITE: NEGATIVE
PROTEIN: 100 mg/dL — AB
SPECIFIC GRAVITY, URINE: 1.019 (ref 1.005–1.030)
pH: 6 (ref 5.0–8.0)

## 2015-12-13 LAB — COMPREHENSIVE METABOLIC PANEL
ALK PHOS: 58 U/L (ref 38–126)
ALT: 19 U/L (ref 14–54)
ANION GAP: 7 (ref 5–15)
AST: 23 U/L (ref 15–41)
Albumin: 3.8 g/dL (ref 3.5–5.0)
BUN: 18 mg/dL (ref 6–20)
CALCIUM: 9.3 mg/dL (ref 8.9–10.3)
CHLORIDE: 104 mmol/L (ref 101–111)
CO2: 27 mmol/L (ref 22–32)
Creatinine, Ser: 1.1 mg/dL — ABNORMAL HIGH (ref 0.44–1.00)
GFR, EST AFRICAN AMERICAN: 54 mL/min — AB (ref >60)
GFR, EST NON AFRICAN AMERICAN: 46 mL/min — AB (ref >60)
Glucose, Bld: 96 mg/dL (ref 65–99)
Potassium: 4.1 mmol/L (ref 3.5–5.1)
SODIUM: 138 mmol/L (ref 135–145)
Total Bilirubin: 0.7 mg/dL (ref 0.3–1.2)
Total Protein: 7.6 g/dL (ref 6.5–8.1)

## 2015-12-13 LAB — LIPASE, BLOOD: LIPASE: 32 U/L (ref 11–51)

## 2015-12-13 LAB — URINE MICROSCOPIC-ADD ON

## 2015-12-13 LAB — CBC WITH DIFFERENTIAL/PLATELET
BASOS ABS: 0 10*3/uL (ref 0.0–0.1)
Basophils Relative: 0 %
EOS ABS: 0 10*3/uL (ref 0.0–0.7)
EOS PCT: 0 %
HCT: 33.8 % — ABNORMAL LOW (ref 36.0–46.0)
HEMOGLOBIN: 10.4 g/dL — AB (ref 12.0–15.0)
LYMPHS ABS: 1.8 10*3/uL (ref 0.7–4.0)
Lymphocytes Relative: 19 %
MCH: 27.2 pg (ref 26.0–34.0)
MCHC: 30.8 g/dL (ref 30.0–36.0)
MCV: 88.3 fL (ref 78.0–100.0)
Monocytes Absolute: 1.1 10*3/uL — ABNORMAL HIGH (ref 0.1–1.0)
Monocytes Relative: 12 %
NEUTROS PCT: 69 %
Neutro Abs: 6.4 10*3/uL (ref 1.7–7.7)
PLATELETS: 245 10*3/uL (ref 150–400)
RBC: 3.83 MIL/uL — AB (ref 3.87–5.11)
RDW: 16.2 % — ABNORMAL HIGH (ref 11.5–15.5)
WBC: 9.4 10*3/uL (ref 4.0–10.5)

## 2015-12-13 LAB — BRAIN NATRIURETIC PEPTIDE: B NATRIURETIC PEPTIDE 5: 2638.2 pg/mL — AB (ref 0.0–100.0)

## 2015-12-13 LAB — LACTIC ACID, PLASMA: LACTIC ACID, VENOUS: 0.9 mmol/L (ref 0.5–2.0)

## 2015-12-13 MED ORDER — SODIUM CHLORIDE 0.9 % IV BOLUS (SEPSIS): 1000.0000 mL | Freq: Once | INTRAVENOUS | Status: DC

## 2015-12-13 MED ORDER — FUROSEMIDE 10 MG/ML IJ SOLN
40.0000 mg | INTRAMUSCULAR | Status: AC
  Administered 2015-12-13: 40 mg via INTRAVENOUS
  Filled 2015-12-13: qty 4

## 2015-12-13 MED ORDER — SODIUM CHLORIDE 0.9 % IV BOLUS (SEPSIS)
500.0000 mL | Freq: Once | INTRAVENOUS | Status: AC
  Administered 2015-12-13: 500 mL via INTRAVENOUS

## 2015-12-13 MED ORDER — ONDANSETRON 4 MG PO TBDP: 4.0000 mg | ORAL_TABLET | Freq: Three times a day (TID) | ORAL | Status: DC | PRN

## 2015-12-13 MED ORDER — IOHEXOL 300 MG/ML  SOLN
50.0000 mL | INTRAMUSCULAR | Status: AC
  Administered 2015-12-13: 50 mL via ORAL

## 2015-12-13 MED ORDER — ONDANSETRON HCL 4 MG/2ML IJ SOLN
4.0000 mg | Freq: Once | INTRAMUSCULAR | Status: AC
  Administered 2015-12-13: 4 mg via INTRAVENOUS
  Filled 2015-12-13: qty 2

## 2015-12-13 NOTE — Progress Notes (Signed)
CSW met with patient at bedside. There was no family present. Patient is alert and oriented. Patient confirms that she is from [place] and presents to [facility] due to nausea and vomiting. Patient states that she receives assistance completing ADL's while at facility.   Patient informed CSW that she that she would feel safe to return to facility upon discharge. Patient states that her son/[name] is her primary support. Patient states her son lives in [place] and visits her often.   Patient informed CSW that she ambulated using a rolling walker.  Patient states she has no questions for CSW at this time.  Willette Brace [phone] ED CSW 12/13/2015 10:10 PM

## 2015-12-13 NOTE — ED Notes (Signed)
Pt to CT scan and returned to room without distress

## 2015-12-13 NOTE — ED Notes (Signed)
EMS arrived to transport back to facility.

## 2015-12-13 NOTE — ED Notes (Signed)
Pt arrived via EMS from [facility] with report of generalized abd pain, n/v x1 and diarrhea noted in colostomy bag. Pt denies symptoms enroute to facility. Abd soft/nondistended and nontender to palpate. BS(+) and active.

## 2015-12-13 NOTE — ED Notes (Signed)
Awake. Verbally responsive. A/O x4. Resp even and unlabored. No audible adventitious breath sounds noted. ABC's intact.  

## 2015-12-13 NOTE — ED Notes (Signed)
Bed: [id number] Expected date:  Expected time:  Means of arrival:  Comments: EMS- 82yo F, abdominal pain/n/v/d

## 2015-12-13 NOTE — Discharge Instructions (Signed)

## 2015-12-13 NOTE — ED Notes (Signed)
Strongly encouraging pt to drink contrast but pt choosing not to drink. Will continue to attempt.

## 2015-12-13 NOTE — ED Provider Notes (Signed)
CSN: [id number]     Arrival date & time 12/13/15  0818 History   First MD Initiated Contact with Patient 12/13/15 0827     Chief Complaint  Patient presents with  . Abdominal Pain     (Consider location/radiation/quality/duration/timing/severity/associated sxs/prior Treatment) HPI Comments: 82yo F w/ extensive PMH including CHF, COPD, HTN, colon cancer s/p colostomy, T2DM who p/w vomiting, diarrhea, and abd pain. Patient was sent from her nursing facility for evaluation of generalized abdominal pain. She reports one episode of vomiting and diarrhea has been noted in her colostomy bag. She states that her abdominal pain is mild. She is unable to characterize when the abdominal pain started. She states the vomiting started last night. Patient unable to provide any further details.  Patient is a 82 y.o. female presenting with abdominal pain. The history is provided by the patient.  Abdominal Pain   Past Medical History  Diagnosis Date  . Hypertension   . Asthma   . CHF (congestive heart failure) ([place])   . COPD (chronic obstructive pulmonary disease) ([place])   . Colon cancer ([place])   . Hyperlipidemia   . Heart murmur   . Anginal pain ([place])   . Pulmonary embolism ([place])   . Pneumonia     "just once" (03/01/2015)  . Hypothyroidism   . History of blood transfusion     "related to anemia, this is my 1st" (03/01/2015)  . Arthritis     "comes and goes" (03/01/2015)  . Renal disorder   . Colostomy care ([place])   . Diabetes mellitus without complication ([place])     pt denies this hx on 03/01/2015   Past Surgical History  Procedure Laterality Date  . Cholecystectomy    . Colon surgery      cancer  . Fracture surgery    . Dilation and curettage of uterus    . Tubal ligation    . Cataract extraction w/ intraocular lens  implant, bilateral Bilateral   . Ankle fracture surgery Left   . Abdominal hysterectomy    . Cardiac catheterization N/A 03/22/2015    Procedure: Right/Left Heart Cath and  Coronary Angiography;  Surgeon: Leonie Man, MD;  Location: [facility];  Service: Cardiovascular;  Laterality: N/A;   Family History  Problem Relation Age of Onset  . CAD    . Hypertension    . Stroke    . Colon cancer Neg Hx   . GI Bleed Neg Hx    Social History  Substance Use Topics  . Smoking status: Never Smoker   . Smokeless tobacco: Never Used  . Alcohol Use: No   OB History    No data available     Review of Systems  Gastrointestinal: Positive for abdominal pain.   10 Systems reviewed and are negative for acute change except as noted in the HPI.    Allergies  Review of patient's allergies indicates no known allergies.  Home Medications   Prior to Admission medications   Medication Sig Start Date End Date Taking? Authorizing Provider  acetaminophen (TYLENOL) 325 MG tablet Take 650 mg by mouth every 6 (six) hours as needed (pain).   Yes Historical Provider, MD  ADVAIR DISKUS 500-50 MCG/DOSE AEPB Inhale 1 puff into the lungs every 12 (twelve) hours. 12/28/14  Yes Historical Provider, MD  albuterol (PROVENTIL) (2.5 MG/3ML) 0.083% nebulizer solution Take 3 mLs (2.5 mg total) by nebulization every 4 (four) hours as needed for wheezing. 04/28/15  Yes Geradine Girt,  DO  aspirin EC 81 MG tablet Take 1 tablet (81 mg total) by mouth every morning. 03/03/15  Yes Ripudeep K Rai, MD  atorvastatin (LIPITOR) 20 MG tablet Take 20 mg by mouth daily.    Yes Historical Provider, MD  carvedilol (COREG) 6.25 MG tablet Take 1 tablet (6.25 mg total) by mouth 2 (two) times daily with a meal. 03/04/15  Yes Geradine Girt, DO  feeding supplement, ENSURE ENLIVE, (ENSURE ENLIVE) LIQD Take 237 mLs by mouth 2 (two) times daily between meals. 03/02/15  Yes Ripudeep Krystal Eaton, MD  ferrous sulfate 325 (65 FE) MG tablet Take 325 mg by mouth every morning.   Yes Historical Provider, MD  furosemide (LASIX) 20 MG tablet Take 60 mg by mouth 3 (three) times a week. Mon, Wednesday, Friday   Yes  Historical Provider, MD  HYDROcodone-acetaminophen (NORCO/VICODIN) 5-325 MG tablet Take 1-2 tablets by mouth every 6 (six) hours as needed for moderate pain. Patient taking differently: Take 1 tablet by mouth every 4 (four) hours as needed for moderate pain.  11/19/15  Yes Shanker Kristeen Mans, MD  isosorbide mononitrate (IMDUR) 30 MG 24 hr tablet Take 1 tablet (30 mg total) by mouth daily. 03/24/15  Yes Nishant Dhungel, MD  lactobacilus acidophilus & bulgar (FLORANEX) TABS chewable tablet Take 1 tablet by mouth 3 (three) times daily with meals. 07/29/15  Yes Florencia Reasons, MD  levothyroxine (SYNTHROID, LEVOTHROID) 50 MCG tablet Take 50 mcg by mouth every morning.  01/16/15  Yes Historical Provider, MD  lisinopril (PRINIVIL,ZESTRIL) 20 MG tablet Take 20 mg by mouth daily.   Yes Historical Provider, MD  oxymetazoline (AFRIN) 0.05 % nasal spray Place 1 spray into both nostrils 2 (two) times daily as needed (for nose bleeds).    Yes Historical Provider, MD  Polyethyl Glycol-Propyl Glycol (SYSTANE OP) Place 1 drop into the left eye 2 (two) times daily.   Yes Historical Provider, MD  potassium chloride (K-DUR) 10 MEQ tablet Take 1 tablet (10 mEq total) by mouth daily. 10/28/15  Yes Noemi Chapel, MD  ranitidine (ZANTAC) 150 MG tablet Take 150 mg by mouth daily.  01/14/15  Yes Historical Provider, MD  sertraline (ZOLOFT) 25 MG tablet Take 25 mg by mouth daily.   Yes Historical Provider, MD  furosemide (LASIX) 40 MG tablet Take 40 mg by mouth 3 (three) times a week. Tues,thur,sat    Historical Provider, MD  ondansetron (ZOFRAN ODT) 4 MG disintegrating tablet Take 1 tablet (4 mg total) by mouth every 8 (eight) hours as needed for nausea or vomiting. 12/13/15   Wenda Overland Dylan Ruotolo, MD   BP 143/70 mmHg  Pulse 93  Temp(Src) 99.6 F (37.6 C) (Oral)  Resp 18  SpO2 95% Physical Exam  Constitutional: She appears well-developed and well-nourished.  Frail, elderly woman uncomfortable, NAD  HENT:  Head: Normocephalic and  atraumatic.  dry mucous membranes  Eyes: Conjunctivae are normal. Pupils are equal, round, and reactive to light.  Neck: Neck supple.  Cardiovascular: Normal rate and regular rhythm.   Murmur heard. 4/6 blowing holosystolic murmur  Pulmonary/Chest: Effort normal and breath sounds normal. No respiratory distress.  Abdominal: Soft. Bowel sounds are normal. She exhibits no distension. There is no rebound and no guarding.  TTP L lower quadrant around colostomy bag, no significant stool in bag  Musculoskeletal: She exhibits edema.  1+ b/l LE edema  Neurological: She is alert.  Oriented to person and place, poor historian  Skin: Skin is warm and dry.  Psychiatric:  distressed  Nursing note and vitals reviewed.   ED Course  Procedures (including critical care time) Labs Review Labs Reviewed  COMPREHENSIVE METABOLIC PANEL - Abnormal; Notable for the following:    Creatinine, Ser 1.10 (*)    GFR calc non Af Amer 46 (*)    GFR calc Af Amer 54 (*)    All other components within normal limits  CBC WITH DIFFERENTIAL/PLATELET - Abnormal; Notable for the following:    RBC 3.83 (*)    Hemoglobin 10.4 (*)    HCT 33.8 (*)    RDW 16.2 (*)    Monocytes Absolute 1.1 (*)    All other components within normal limits  URINALYSIS, ROUTINE W REFLEX MICROSCOPIC (NOT AT [facility]) - Abnormal; Notable for the following:    APPearance CLOUDY (*)    Hgb urine dipstick TRACE (*)    Protein, ur 100 (*)    All other components within normal limits  BRAIN NATRIURETIC PEPTIDE - Abnormal; Notable for the following:    B Natriuretic Peptide 2638.2 (*)    All other components within normal limits  URINE MICROSCOPIC-ADD ON - Abnormal; Notable for the following:    Squamous Epithelial / LPF 6-30 (*)    Bacteria, UA FEW (*)    All other components within normal limits  LIPASE, BLOOD  LACTIC ACID, PLASMA    Imaging Review Ct Abdomen Pelvis Wo Contrast  12/13/2015  CLINICAL DATA:  Generalized abdominal pain with  nausea, vomiting and diarrhea. History of colon cancer. Initial encounter. EXAM: CT ABDOMEN AND PELVIS WITHOUT CONTRAST TECHNIQUE: Multidetector CT imaging of the abdomen and pelvis was performed following the standard protocol without IV contrast. COMPARISON:  CT abdomen and pelvis 07/27/2015. FINDINGS: There is marked cardiomegaly. Leads from pacing device are noted. Calcific aortic and coronary atherosclerosis is seen. No pleural or pericardial effusion. Mild dependent atelectasis is present in the lung bases. The patient is status post subtotal colectomy with a left lower quadrant colostomy noted. There is no evidence of small bowel obstruction. As seen on prior examinations, there is a loop of small bowel which has herniated into the patient's ostomy. No pneumatosis, portal venous gas or free intraperitoneal air is seen. Small stones are seen in the fundus of the gallbladder. No evidence of cholecystitis is present. The liver, spleen, adrenal glands, pancreas and kidneys are unremarkable. Small bilateral pelvic sidewall lymph nodes are unchanged since 2010. No new or enlarging lymph nodes are identified. Aortoiliac atherosclerosis without aneurysm is identified. No lytic or sclerotic bony lesion is seen. IMPRESSION: No acute abnormality abdomen or pelvis. Cardiomegaly. Calcific aortic and coronary atherosclerosis. Gallstones without evidence cholecystitis. Status post subtotal colectomy the left lower quadrant ostomy. Electronically Signed   By: Inge Rise M.D.   On: 12/13/2015 13:50   I have personally reviewed and evaluated these lab results as part of my medical decision-making.   EKG Interpretation None     Medications  iohexol (OMNIPAQUE) 300 MG/ML solution 50 mL (50 mLs Oral Contrast Given 12/13/15 1054)  ondansetron (ZOFRAN) injection 4 mg (4 mg Intravenous Given 12/13/15 0942)  sodium chloride 0.9 % bolus 500 mL (0 mLs Intravenous Stopped 12/13/15 1015)  furosemide (LASIX) injection 40 mg  (40 mg Intravenous Given 12/13/15 1113)    MDM   Final diagnoses:  Vomiting and diarrhea  Generalized abdominal pain   Pt w/ h/o colon CA s/p colostomy who p/w abdominal pain and vomiting reported at her nursing facility. On exam, she was frail and uncomfortable but  in no acute distress. Vital signs stable. She had tenderness around her colostomy bag with empty bag. Obtained above labs, gave IVF and zofran, and obtained CT to eval for SBO or other acute process.  Labs notable for creatinine 1.10 which is slightly better than baseline, stable anemia, BNP elevated at 2638. Gave the patient an IV Lasix dose. On several reexaminations, she has been resting comfortably with no significant complaints of pain, no vomiting, and no respiratory symptoms. O2 sat has been stable on room air. CT shows no acute abnormality of the abdomen or pelvis. She has been able to tolerate ginger ale in the ED with no vomiting. I discussed with her son the possibility of gastroenteritis given the vomiting and diarrhea. Emphasized the importance of adequate hydration and monitoring for any worsening symptoms. He voiced understanding and patient discharged back to nursing facility.  Sharlett Iles, MD 12/13/15 [phone]

## 2015-12-13 NOTE — ED Notes (Signed)
Pt given sips of Ginger Ale and tolerating well.

## 2015-12-23 ENCOUNTER — Ambulatory Visit (HOSPITAL_COMMUNITY)
Admission: RE | Admit: 2015-12-23 | Discharge: 2015-12-23 | Disposition: A | Discharge status: Home / Self Care | Payer: Medicare HMO | Source: Ambulatory Visit | Attending: Internal Medicine | Admitting: Internal Medicine

## 2015-12-23 VITALS — BP 140/62 | HR 93 | Wt 181.4 lb

## 2015-12-23 DIAGNOSIS — N183 Chronic kidney disease, stage 3 unspecified: Secondary | ICD-10-CM

## 2015-12-23 DIAGNOSIS — J449 Chronic obstructive pulmonary disease, unspecified: Secondary | ICD-10-CM | POA: Insufficient documentation

## 2015-12-23 DIAGNOSIS — I10 Essential (primary) hypertension: Secondary | ICD-10-CM

## 2015-12-23 DIAGNOSIS — Z79899 Other long term (current) drug therapy: Secondary | ICD-10-CM | POA: Insufficient documentation

## 2015-12-23 DIAGNOSIS — I428 Other cardiomyopathies: Secondary | ICD-10-CM | POA: Insufficient documentation

## 2015-12-23 DIAGNOSIS — E785 Hyperlipidemia, unspecified: Secondary | ICD-10-CM | POA: Insufficient documentation

## 2015-12-23 DIAGNOSIS — I5022 Chronic systolic (congestive) heart failure: Secondary | ICD-10-CM | POA: Diagnosis not present

## 2015-12-23 DIAGNOSIS — Z85038 Personal history of other malignant neoplasm of large intestine: Secondary | ICD-10-CM | POA: Insufficient documentation

## 2015-12-23 DIAGNOSIS — F0390 Unspecified dementia without behavioral disturbance: Secondary | ICD-10-CM | POA: Insufficient documentation

## 2015-12-23 DIAGNOSIS — I429 Cardiomyopathy, unspecified: Secondary | ICD-10-CM | POA: Diagnosis not present

## 2015-12-23 DIAGNOSIS — Z95810 Presence of automatic (implantable) cardiac defibrillator: Secondary | ICD-10-CM | POA: Insufficient documentation

## 2015-12-23 DIAGNOSIS — E039 Hypothyroidism, unspecified: Secondary | ICD-10-CM | POA: Insufficient documentation

## 2015-12-23 DIAGNOSIS — I130 Hypertensive heart and chronic kidney disease with heart failure and stage 1 through stage 4 chronic kidney disease, or unspecified chronic kidney disease: Secondary | ICD-10-CM | POA: Diagnosis not present

## 2015-12-23 DIAGNOSIS — Z86711 Personal history of pulmonary embolism: Secondary | ICD-10-CM | POA: Diagnosis not present

## 2015-12-23 DIAGNOSIS — Z7982 Long term (current) use of aspirin: Secondary | ICD-10-CM | POA: Diagnosis not present

## 2015-12-23 LAB — BASIC METABOLIC PANEL
ANION GAP: 9 (ref 5–15)
BUN: 16 mg/dL (ref 6–20)
CHLORIDE: 107 mmol/L (ref 101–111)
CO2: 28 mmol/L (ref 22–32)
Calcium: 9.8 mg/dL (ref 8.9–10.3)
Creatinine, Ser: 0.92 mg/dL (ref 0.44–1.00)
GFR calc non Af Amer: 58 mL/min — ABNORMAL LOW (ref >60)
Glucose, Bld: 92 mg/dL (ref 65–99)
POTASSIUM: 3.7 mmol/L (ref 3.5–5.1)
Sodium: 144 mmol/L (ref 135–145)

## 2015-12-23 LAB — BRAIN NATRIURETIC PEPTIDE: B NATRIURETIC PEPTIDE 5: 733.1 pg/mL — AB (ref 0.0–100.0)

## 2015-12-23 MED ORDER — CARVEDILOL 6.25 MG PO TABS: 9.3750 mg | ORAL_TABLET | Freq: Two times a day (BID) | ORAL | Status: DC

## 2015-12-23 MED ORDER — FUROSEMIDE 20 MG PO TABS: 80.0000 mg | ORAL_TABLET | ORAL | Status: DC

## 2015-12-23 MED ORDER — HYDRALAZINE HCL 25 MG PO TABS: 12.5000 mg | ORAL_TABLET | Freq: Three times a day (TID) | ORAL | Status: DC

## 2015-12-23 NOTE — Patient Instructions (Signed)
INCREASE Monday, Wednesday, Friday dose to 80 mg daily INCREASE Carvedilol to 9.375 mg ,one and one half tab twice a day START HYDRALAZINE 12.5 MG, ONE HALF TAB THREE TIMES A DAY  Your physician recommends that you schedule a follow-up appointment in: [place] In the [place] the following things EVERYDAY: 1) Weigh yourself in the morning before breakfast. Write it down and keep it in a log. 2) Take your medicines as prescribed 3) Eat low salt foods-Limit salt (sodium) to 2000 mg per day.  4) Stay as active as you can everyday 5) Limit all fluids for the day to less than 2 liters 6)

## 2015-12-23 NOTE — Progress Notes (Signed)
Advanced Heart Failure Medication Review by a Pharmacist  Does the patient  feel that his/her medications are working for him/her?  yes  Has the patient been experiencing any side effects to the medications prescribed?  no  Does the patient measure his/her own blood pressure or blood glucose at home?  yes   Does the patient have any problems obtaining medications due to transportation or finances?   no  Understanding of regimen: good Understanding of indications: good Potential of compliance: good Patient understands to avoid NSAIDs. Patient understands to avoid decongestants.  Issues to address at subsequent visits: None   Pharmacist comments:  [name] is a pleasant 82 yo F presenting with an up-to-date MAR from [facility]. All medications were reconciled and noted last recorded weight at 162 lb on 3/14.   Ruta Hinds. Velva Harman, PharmD, BCPS, CPP Clinical Pharmacist Pager: [phone] Phone: [phone] 12/23/2015 10:14 AM      Time with patient: 4 minutes Preparation and documentation time: 8 minutes Total time: 12 minutes

## 2015-12-23 NOTE — Progress Notes (Signed)
Patient ID: LESLY [name], female   DOB: 15-Nov-1933, 82 y.o.   MRN: [id number] PCP: Primary Cardiologist: Dr Haroldine Laws   HPI: Shelby Hughesis a 82 y.o. 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   Admitted earlier this month with increased and leg edema. Diuresed with IV lasix and transitioned to torsemide. Red Vest went down from 43 to 27. Diuretics stopped on discharge. Discharge weight was 162 pounds.   Evaluated [facility] with nausea/hypotension on February 13th. Received IV fluids and discharged.   She returns for post hospital follow up. Since the last visit she was evaluated in the ED for abdominal pain. CT of abdomen was negative for acute findings. BNP was elevated 2638 and she received a dose of IV lasix.  Overall feeling ok. Denis SOB/PND/Orthopnea. Yesterday she had [employer]. Weight at SNF 162-165 pounds but she says yesterday her weight was 177 pounds. Lives at [facility].   Labs 11/22/2015: K 4.0 Creatinine 1.52  Labs 12/13/2015: K 4.1 Creatinine 1.10  Reds Vest Reading 12/23/2015: 31  Reds Vest Reading 2/10/15/2015: 31 Reds Vest Reading 11/19/2015: 27   ROS: All systems negative except as listed in HPI, PMH and Problem List.  SH:  Social History   Social History  . Marital Status: Divorced    Spouse Name: N/A  . Number of Children: N/A  . Years of Education: N/A   Occupational History  . Retired Psychologist, counselling    Social History Main Topics  . Smoking status: Never Smoker   . Smokeless tobacco: Never Used  . Alcohol Use: No  . Drug Use: No  . Sexual Activity: No   Other Topics Concern  . Not on file   Social History Narrative    FH:  Family History  Problem Relation Age of Onset  . CAD    . Hypertension    . Stroke    . Colon cancer  Neg Hx   . GI Bleed Neg Hx     Past Medical History  Diagnosis Date  . Hypertension   . Asthma   . CHF (congestive heart failure) ([place])   . COPD (chronic obstructive pulmonary disease) ([place])   . Colon cancer ([place])   . Hyperlipidemia   . Heart murmur   . Anginal pain ([place])   . Pulmonary embolism ([place])   . Pneumonia     "just once" (03/01/2015)  . Hypothyroidism   . History of blood transfusion     "related to anemia, this is my 1st" (03/01/2015)  . Arthritis     "comes and goes" (03/01/2015)  . Renal disorder   . Colostomy care ([place])   . Diabetes mellitus without complication ([place])     pt denies this hx on 03/01/2015    Current Outpatient Prescriptions  Medication Sig Dispense Refill  . acetaminophen (TYLENOL) 325 MG tablet Take 650 mg by mouth every 6 (six) hours as needed (pain).    . ADVAIR DISKUS 500-50 MCG/DOSE AEPB Inhale 1 puff into the lungs every 12 (twelve) hours.  3  . albuterol (PROVENTIL) (2.5 MG/3ML) 0.083% nebulizer solution Take 3 mLs (2.5 mg total) by nebulization every 4 (four) hours as needed for wheezing. 75 mL 12  . aspirin EC 81 MG tablet Take 1 tablet (81 mg total) by mouth every morning.    Marland Kitchen  atorvastatin (LIPITOR) 20 MG tablet Take 20 mg by mouth daily.     . carvedilol (COREG) 6.25 MG tablet Take 1 tablet (6.25 mg total) by mouth 2 (two) times daily with a meal.    . feeding supplement, ENSURE ENLIVE, (ENSURE ENLIVE) LIQD Take 237 mLs by mouth 2 (two) times daily between meals. 237 mL 12  . ferrous sulfate 325 (65 FE) MG tablet Take 325 mg by mouth every morning.    . furosemide (LASIX) 20 MG tablet Take 60 mg by mouth 3 (three) times a week. Mon, Wednesday, Friday    . furosemide (LASIX) 40 MG tablet Take 40 mg by mouth 3 (three) times a week. Tues,thur,sat    . HYDROcodone-acetaminophen (NORCO/VICODIN) 5-325 MG tablet Take 1-2 tablets by mouth every 6 (six) hours as needed for moderate pain. (Patient taking differently: Take 1 tablet by mouth every 4  (four) hours as needed for moderate pain. ) 30 tablet 0  . isosorbide mononitrate (IMDUR) 30 MG 24 hr tablet Take 1 tablet (30 mg total) by mouth daily. 30 tablet 0  . lactobacilus acidophilus & bulgar (FLORANEX) TABS chewable tablet Take 1 tablet by mouth 3 (three) times daily with meals. 30 tablet 0  . levothyroxine (SYNTHROID, LEVOTHROID) 50 MCG tablet Take 50 mcg by mouth every morning.   2  . lisinopril (PRINIVIL,ZESTRIL) 20 MG tablet Take 20 mg by mouth daily.    . ondansetron (ZOFRAN ODT) 4 MG disintegrating tablet Take 1 tablet (4 mg total) by mouth every 8 (eight) hours as needed for nausea or vomiting. 8 tablet 0  . oxymetazoline (AFRIN) 0.05 % nasal spray Place 1 spray into both nostrils 2 (two) times daily as needed (for nose bleeds).     Vladimir Faster Glycol-Propyl Glycol (SYSTANE OP) Place 1 drop into the left eye 2 (two) times daily.    . potassium chloride (K-DUR) 10 MEQ tablet Take 1 tablet (10 mEq total) by mouth daily. 5 tablet 0  . ranitidine (ZANTAC) 150 MG tablet Take 150 mg by mouth daily.   11  . sertraline (ZOLOFT) 25 MG tablet Take 25 mg by mouth daily.     No current facility-administered medications for this encounter.    Filed Vitals:   12/23/15 1000  BP: 140/62  Pulse: 93  Weight: 181 lb 6.4 oz (82.283 kg)  SpO2: 96%   Filed Weights   12/23/15 1000  Weight: 181 lb 6.4 oz (82.283 kg)   PHYSICAL EXAM:  General:  Well appearing. No resp difficulty. In wheel chair. Care giver present  HEENT: normal Neck: supple. JVP flat. Carotids 2+ bilaterally; no bruits. No lymphadenopathy or thryomegaly appreciated. Cor: PMI normal. Regular rate & rhythm. No rubs, gallops or murmurs. Lungs: LLL RML RLL crackles Abdomen: soft, nontender, nondistended. No hepatosplenomegaly. No bruits or masses. Good bowel sounds. Extremities: no cyanosis, clubbing, rash, R and LLE trace -1+ edema ted hose on  Neuro: alert & orientedx3, cranial nerves grossly intact. Moves all 4  extremities w/o difficulty. Affect pleasant.     ASSESSMENT & PLAN: 1. Chronic Systolic Heart Failure. NICM Medtronic ICD. 11/16/2015 ECHO EF 35-40% Grade I DD NYHA II-II. Volume status mildly elevated.  Weight up from 158>181 pounds. Reds Vest reading 31.  Check BMET today and in 7 days. Increase lasix to 80 mg M-W-F Continue 40 mg Tues-Thurs-Sat.  Increase carvedilol to 9.375 mg twice a day.  Continue lisinopril 20 mg daily. Next visit consider entresto Continue imdur 30 mg daily. Add 12.5  mg hydralazine three times a day.  Today I have asked her to limit high salt foods such as soups and [employer].  Plan to repeat ECHO in 3 months.  2. Dementia 3. COPD 4. Hypothroidism.- on synthyroid. Per PCP 5. CKD III-Recent Creatinine 1.1    Follow up in 6 weeks.   Amy Clegg NP-C  10:09 AM

## 2015-12-30 ENCOUNTER — Telehealth (HOSPITAL_COMMUNITY): Payer: Self-pay | Admitting: *Deleted

## 2015-12-30 MED ORDER — FUROSEMIDE 40 MG PO TABS: 40.0000 mg | ORAL_TABLET | Freq: Every day | ORAL | Status: DC

## 2015-12-30 NOTE — Telephone Encounter (Signed)
Received call from [place] at [facility]. Where pt lives.  She states pt's BP is low today and she is not feeling well.  BP today is 92/48, it usually runs 130-140, wt is down to 169 lb today, was 181 lb on 3./16 in our office and 172 on 3/17 at facility.  She states pt does feel dizzy and a little clammy.  Discussed w/Andy Chalmers Cater, PA he would like pt to decrease Lasix back to 40 mg daily, with extra 40 mg as needed for wt gain of 3 lb in 24 hour or 5 lb in a week, and pt needs bmet today.  Ebony Hail is aware and orders faxed to facility at [phone]

## 2016-02-02 ENCOUNTER — Encounter (HOSPITAL_COMMUNITY): Payer: Self-pay | Admitting: *Deleted

## 2016-02-02 ENCOUNTER — Observation Stay (HOSPITAL_COMMUNITY): Payer: Medicare HMO

## 2016-02-02 ENCOUNTER — Observation Stay (HOSPITAL_COMMUNITY)
Admission: EM | Admit: 2016-02-02 | Discharge: 2016-02-04 | Disposition: A | Discharge status: Home / Self Care | Payer: Medicare HMO | Attending: Internal Medicine | Admitting: Internal Medicine

## 2016-02-02 DIAGNOSIS — Z85038 Personal history of other malignant neoplasm of large intestine: Secondary | ICD-10-CM | POA: Diagnosis not present

## 2016-02-02 DIAGNOSIS — Z95810 Presence of automatic (implantable) cardiac defibrillator: Secondary | ICD-10-CM | POA: Insufficient documentation

## 2016-02-02 DIAGNOSIS — E039 Hypothyroidism, unspecified: Secondary | ICD-10-CM | POA: Diagnosis present

## 2016-02-02 DIAGNOSIS — D638 Anemia in other chronic diseases classified elsewhere: Secondary | ICD-10-CM

## 2016-02-02 DIAGNOSIS — F0390 Unspecified dementia without behavioral disturbance: Secondary | ICD-10-CM | POA: Insufficient documentation

## 2016-02-02 DIAGNOSIS — F329 Major depressive disorder, single episode, unspecified: Secondary | ICD-10-CM | POA: Insufficient documentation

## 2016-02-02 DIAGNOSIS — I429 Cardiomyopathy, unspecified: Secondary | ICD-10-CM | POA: Diagnosis not present

## 2016-02-02 DIAGNOSIS — C189 Malignant neoplasm of colon, unspecified: Secondary | ICD-10-CM

## 2016-02-02 DIAGNOSIS — R55 Syncope and collapse: Secondary | ICD-10-CM | POA: Diagnosis not present

## 2016-02-02 DIAGNOSIS — I129 Hypertensive chronic kidney disease with stage 1 through stage 4 chronic kidney disease, or unspecified chronic kidney disease: Secondary | ICD-10-CM | POA: Diagnosis not present

## 2016-02-02 DIAGNOSIS — I10 Essential (primary) hypertension: Secondary | ICD-10-CM | POA: Diagnosis present

## 2016-02-02 DIAGNOSIS — N183 Chronic kidney disease, stage 3 unspecified: Secondary | ICD-10-CM | POA: Diagnosis present

## 2016-02-02 DIAGNOSIS — D631 Anemia in chronic kidney disease: Secondary | ICD-10-CM | POA: Insufficient documentation

## 2016-02-02 DIAGNOSIS — J449 Chronic obstructive pulmonary disease, unspecified: Secondary | ICD-10-CM | POA: Insufficient documentation

## 2016-02-02 DIAGNOSIS — N179 Acute kidney failure, unspecified: Secondary | ICD-10-CM | POA: Insufficient documentation

## 2016-02-02 DIAGNOSIS — E785 Hyperlipidemia, unspecified: Secondary | ICD-10-CM | POA: Insufficient documentation

## 2016-02-02 DIAGNOSIS — K219 Gastro-esophageal reflux disease without esophagitis: Secondary | ICD-10-CM | POA: Insufficient documentation

## 2016-02-02 DIAGNOSIS — Z86711 Personal history of pulmonary embolism: Secondary | ICD-10-CM | POA: Insufficient documentation

## 2016-02-02 DIAGNOSIS — D649 Anemia, unspecified: Secondary | ICD-10-CM

## 2016-02-02 DIAGNOSIS — C20 Malignant neoplasm of rectum: Secondary | ICD-10-CM | POA: Diagnosis present

## 2016-02-02 HISTORY — DX: Presence of automatic (implantable) cardiac defibrillator: Z95.810

## 2016-02-02 LAB — CBG MONITORING, ED: GLUCOSE-CAPILLARY: 66 mg/dL (ref 65–99)

## 2016-02-02 LAB — CBC
HEMATOCRIT: 27.2 % — AB (ref 36.0–46.0)
Hemoglobin: 8.3 g/dL — ABNORMAL LOW (ref 12.0–15.0)
MCH: 26.3 pg (ref 26.0–34.0)
MCHC: 30.5 g/dL (ref 30.0–36.0)
MCV: 86.3 fL (ref 78.0–100.0)
Platelets: 260 10*3/uL (ref 150–400)
RBC: 3.15 MIL/uL — ABNORMAL LOW (ref 3.87–5.11)
RDW: 17.1 % — AB (ref 11.5–15.5)
WBC: 6.5 10*3/uL (ref 4.0–10.5)

## 2016-02-02 LAB — URINALYSIS, ROUTINE W REFLEX MICROSCOPIC
BILIRUBIN URINE: NEGATIVE
Glucose, UA: NEGATIVE mg/dL
Hgb urine dipstick: NEGATIVE
KETONES UR: NEGATIVE mg/dL
LEUKOCYTES UA: NEGATIVE
NITRITE: NEGATIVE
PROTEIN: NEGATIVE mg/dL
Specific Gravity, Urine: 1.014 (ref 1.005–1.030)
pH: 5.5 (ref 5.0–8.0)

## 2016-02-02 LAB — BASIC METABOLIC PANEL
Anion gap: 10 (ref 5–15)
BUN: 35 mg/dL — AB (ref 6–20)
CHLORIDE: 108 mmol/L (ref 101–111)
CO2: 22 mmol/L (ref 22–32)
CREATININE: 1.42 mg/dL — AB (ref 0.44–1.00)
Calcium: 9.6 mg/dL (ref 8.9–10.3)
GFR calc Af Amer: 40 mL/min — ABNORMAL LOW (ref >60)
GFR calc non Af Amer: 34 mL/min — ABNORMAL LOW (ref >60)
GLUCOSE: 93 mg/dL (ref 65–99)
POTASSIUM: 4.2 mmol/L (ref 3.5–5.1)
SODIUM: 140 mmol/L (ref 135–145)

## 2016-02-02 LAB — POC OCCULT BLOOD, ED: FECAL OCCULT BLD: NEGATIVE

## 2016-02-02 LAB — I-STAT TROPONIN, ED: Troponin i, poc: 0.02 ng/mL (ref 0.00–0.08)

## 2016-02-02 MED ORDER — LISINOPRIL 20 MG PO TABS
20.0000 mg | ORAL_TABLET | Freq: Every day | ORAL | Status: DC
  Administered 2016-02-03 – 2016-02-04 (×2): 20 mg via ORAL
  Filled 2016-02-02 (×2): qty 1

## 2016-02-02 MED ORDER — SERTRALINE HCL 50 MG PO TABS
25.0000 mg | ORAL_TABLET | Freq: Every day | ORAL | Status: DC
  Administered 2016-02-03 – 2016-02-04 (×2): 25 mg via ORAL
  Filled 2016-02-02 (×2): qty 1

## 2016-02-02 MED ORDER — FERROUS SULFATE 325 (65 FE) MG PO TABS: 325.0000 mg | ORAL_TABLET | Freq: Every day | ORAL | Status: DC

## 2016-02-02 MED ORDER — FAMOTIDINE 20 MG PO TABS
20.0000 mg | ORAL_TABLET | Freq: Every day | ORAL | Status: DC
  Administered 2016-02-03 – 2016-02-04 (×2): 20 mg via ORAL
  Filled 2016-02-02 (×2): qty 1

## 2016-02-02 MED ORDER — CARVEDILOL 6.25 MG PO TABS
9.3750 mg | ORAL_TABLET | Freq: Two times a day (BID) | ORAL | Status: DC
  Administered 2016-02-03: 9.375 mg via ORAL
  Filled 2016-02-02 (×2): qty 1

## 2016-02-02 MED ORDER — FUROSEMIDE 40 MG PO TABS
40.0000 mg | ORAL_TABLET | Freq: Every day | ORAL | Status: DC
  Administered 2016-02-03 – 2016-02-04 (×2): 40 mg via ORAL
  Filled 2016-02-02 (×2): qty 1

## 2016-02-02 MED ORDER — FERROUS SULFATE 325 (65 FE) MG PO TABS
325.0000 mg | ORAL_TABLET | Freq: Every day | ORAL | Status: DC
  Administered 2016-02-03 – 2016-02-04 (×2): 325 mg via ORAL
  Filled 2016-02-02 (×2): qty 1

## 2016-02-02 MED ORDER — DEXTROSE 50 % IV SOLN
25.0000 mL | Freq: Once | INTRAVENOUS | Status: AC
  Administered 2016-02-02: 25 mL via INTRAVENOUS

## 2016-02-02 MED ORDER — LEVOTHYROXINE SODIUM 50 MCG PO TABS
50.0000 ug | ORAL_TABLET | Freq: Every day | ORAL | Status: DC
  Administered 2016-02-03 – 2016-02-04 (×2): 50 ug via ORAL
  Filled 2016-02-02 (×2): qty 1

## 2016-02-02 MED ORDER — MOMETASONE FURO-FORMOTEROL FUM 200-5 MCG/ACT IN AERO
2.0000 | INHALATION_SPRAY | Freq: Two times a day (BID) | RESPIRATORY_TRACT | Status: DC
  Administered 2016-02-03 – 2016-02-04 (×3): 2 via RESPIRATORY_TRACT
  Filled 2016-02-02: qty 8.8

## 2016-02-02 MED ORDER — DEXTROSE 50 % IV SOLN
INTRAVENOUS | Status: AC
  Filled 2016-02-02: qty 50

## 2016-02-02 MED ORDER — HYDROCODONE-ACETAMINOPHEN 5-325 MG PO TABS
1.0000 | ORAL_TABLET | ORAL | Status: DC | PRN
  Administered 2016-02-03: 1 via ORAL
  Filled 2016-02-02: qty 1

## 2016-02-02 MED ORDER — ENOXAPARIN SODIUM 30 MG/0.3ML ~~LOC~~ SOLN
30.0000 mg | SUBCUTANEOUS | Status: DC
  Administered 2016-02-02 – 2016-02-03 (×2): 30 mg via SUBCUTANEOUS
  Filled 2016-02-02 (×2): qty 0.3

## 2016-02-02 MED ORDER — CARVEDILOL 6.25 MG PO TABS
9.3750 mg | ORAL_TABLET | Freq: Two times a day (BID) | ORAL | Status: DC
  Administered 2016-02-02: 9.375 mg via ORAL
  Filled 2016-02-02: qty 1

## 2016-02-02 MED ORDER — ENSURE ENLIVE PO LIQD
237.0000 mL | Freq: Two times a day (BID) | ORAL | Status: DC
  Administered 2016-02-03 – 2016-02-04 (×4): 237 mL via ORAL

## 2016-02-02 MED ORDER — ASPIRIN EC 81 MG PO TBEC
81.0000 mg | DELAYED_RELEASE_TABLET | Freq: Every day | ORAL | Status: DC
  Administered 2016-02-03 – 2016-02-04 (×2): 81 mg via ORAL
  Filled 2016-02-02 (×2): qty 1

## 2016-02-02 MED ORDER — ISOSORBIDE MONONITRATE ER 30 MG PO TB24
30.0000 mg | ORAL_TABLET | Freq: Every day | ORAL | Status: DC
  Administered 2016-02-03 – 2016-02-04 (×2): 30 mg via ORAL
  Filled 2016-02-02 (×2): qty 1

## 2016-02-02 MED ORDER — POLYETHYL GLYCOL-PROPYL GLYCOL 0.4-0.3 % OP GEL
Freq: Two times a day (BID) | OPHTHALMIC | Status: DC
  Filled 2016-02-02: qty 10

## 2016-02-02 MED ORDER — ATORVASTATIN CALCIUM 20 MG PO TABS
20.0000 mg | ORAL_TABLET | Freq: Every day | ORAL | Status: DC
  Administered 2016-02-03 – 2016-02-04 (×2): 20 mg via ORAL
  Filled 2016-02-02 (×2): qty 1

## 2016-02-02 MED ORDER — ACETAMINOPHEN 325 MG PO TABS
650.0000 mg | ORAL_TABLET | Freq: Four times a day (QID) | ORAL | Status: DC | PRN
Start: 2016-02-02 — End: 2016-02-04
  Administered 2016-02-04: 650 mg via ORAL
  Filled 2016-02-02: qty 2

## 2016-02-02 MED ORDER — POLYVINYL ALCOHOL 1.4 % OP SOLN
1.0000 [drp] | Freq: Two times a day (BID) | OPHTHALMIC | Status: DC
  Administered 2016-02-02 – 2016-02-04 (×4): 1 [drp] via OPHTHALMIC
  Filled 2016-02-02: qty 15

## 2016-02-02 MED ORDER — ACETAMINOPHEN 650 MG RE SUPP: 650.0000 mg | Freq: Four times a day (QID) | RECTAL | Status: DC | PRN

## 2016-02-02 MED ORDER — ALBUTEROL SULFATE (2.5 MG/3ML) 0.083% IN NEBU: 2.5000 mg | INHALATION_SOLUTION | RESPIRATORY_TRACT | Status: DC | PRN

## 2016-02-02 MED ORDER — SODIUM CHLORIDE 0.9% FLUSH
3.0000 mL | Freq: Two times a day (BID) | INTRAVENOUS | Status: DC
  Administered 2016-02-02 – 2016-02-04 (×4): 3 mL via INTRAVENOUS

## 2016-02-02 NOTE — ED Notes (Signed)
Per EMS- pt was at the facility and sitting in her chair and lean over and went unresponsive. Pt was diaphoretic and beginning to respond more upon EMS arrival. Pt denies pain. Has pacemaker in place. Is scheduled to see cardiology tomorrow. BP 120 palpated. Pt noted to have edema to lower extremities.

## 2016-02-02 NOTE — ED Notes (Signed)
Pt has medtronic pacemaker. 

## 2016-02-02 NOTE — ED Provider Notes (Signed)
CSN: [id number]     Arrival date & time 02/02/16  1325 History   First MD Initiated Contact with Patient 02/02/16 1332     Chief Complaint  Patient presents with  . Loss of Consciousness     (Consider location/radiation/quality/duration/timing/severity/associated sxs/prior Treatment) HPI  Pt with hx of multiple medical problems including HTN, cardiomyopathy with pacemaker, COPD presentign after syncopal event.  Per EMS she was eating lunch at her facility and then slumped over in her chair and was briefly unresponsive.  She states she does not remember this but did feel very weak and was sweating.  No chest pain.  She was responsive for EMS and diaphoretic.  She denies shortness of breath, no headache, no focal weakness.  She states she feels back to her baseline now.  She denies any change in her chronic lower extremity swelling.  There are no other associated systemic symptoms, there are no other alleviating or modifying factors.   Past Medical History  Diagnosis Date  . Hypertension   . Asthma   . CHF (congestive heart failure) ([place])   . COPD (chronic obstructive pulmonary disease) ([place])   . Colon cancer ([place])   . Hyperlipidemia   . Heart murmur   . Anginal pain ([place])   . Pulmonary embolism ([place])   . Pneumonia     "just once" (03/01/2015)  . Hypothyroidism   . History of blood transfusion     "related to anemia, this is my 1st" (03/01/2015)  . Arthritis     "comes and goes" (03/01/2015)  . Renal disorder   . Colostomy care ([place])   . Diabetes mellitus without complication ([place])     pt denies this hx on 03/01/2015   Past Surgical History  Procedure Laterality Date  . Cholecystectomy    . Colon surgery      cancer  . Fracture surgery    . Dilation and curettage of uterus    . Tubal ligation    . Cataract extraction w/ intraocular lens  implant, bilateral Bilateral   . Ankle fracture surgery Left   . Abdominal hysterectomy    . Cardiac catheterization N/A 03/22/2015   Procedure: Right/Left Heart Cath and Coronary Angiography;  Surgeon: Leonie Man, MD;  Location: [facility];  Service: Cardiovascular;  Laterality: N/A;   Family History  Problem Relation Age of Onset  . CAD    . Hypertension    . Stroke    . Colon cancer Neg Hx   . GI Bleed Neg Hx    Social History  Substance Use Topics  . Smoking status: Never Smoker   . Smokeless tobacco: Never Used  . Alcohol Use: No   OB History    No data available     Review of Systems  ROS reviewed and all otherwise negative except for mentioned in HPI    Allergies  Review of patient's allergies indicates no known allergies.  Home Medications   Prior to Admission medications   Medication Sig Start Date End Date Taking? Authorizing Provider  acetaminophen (TYLENOL) 325 MG tablet Take 650 mg by mouth every 6 (six) hours as needed (pain).   Yes Historical Provider, MD  ADVAIR DISKUS 500-50 MCG/DOSE AEPB Inhale 1 puff into the lungs every 12 (twelve) hours. 12/28/14  Yes Historical Provider, MD  albuterol (PROVENTIL) (2.5 MG/3ML) 0.083% nebulizer solution Take 3 mLs (2.5 mg total) by nebulization every 4 (four) hours as needed for wheezing. 04/28/15  Yes Geradine Girt,  DO  aspirin EC 81 MG tablet Take 1 tablet (81 mg total) by mouth every morning. 03/03/15  Yes Ripudeep K Rai, MD  atorvastatin (LIPITOR) 20 MG tablet Take 20 mg by mouth daily.    Yes Historical Provider, MD  carvedilol (COREG) 6.25 MG tablet Take 1.5 tablets (9.375 mg total) by mouth 2 (two) times daily with a meal. 12/23/15  Yes Amy D Clegg, NP  feeding supplement, ENSURE ENLIVE, (ENSURE ENLIVE) LIQD Take 237 mLs by mouth 2 (two) times daily between meals. Patient taking differently: Take 237 mLs by mouth 2 (two) times daily.  03/02/15  Yes Ripudeep Krystal Eaton, MD  ferrous sulfate 325 (65 FE) MG tablet Take 325 mg by mouth every morning.   Yes Historical Provider, MD  furosemide (LASIX) 40 MG tablet Take 1 tablet (40 mg total) by mouth  daily. 12/30/15  Yes Shirley Friar, PA-C  furosemide (LASIX) 80 MG tablet Take 80 mg by mouth daily as needed (weight gain >3 pounds in 24 hours or 5 pounds in one week).   Yes Historical Provider, MD  hydrALAZINE (APRESOLINE) 25 MG tablet Take 0.5 tablets (12.5 mg total) by mouth 3 (three) times daily. 12/23/15  Yes Amy D Clegg, NP  HYDROcodone-acetaminophen (NORCO/VICODIN) 5-325 MG tablet Take 1 tablet by mouth every 4 (four) hours as needed for moderate pain.   Yes Historical Provider, MD  isosorbide mononitrate (IMDUR) 30 MG 24 hr tablet Take 1 tablet (30 mg total) by mouth daily. 03/24/15  Yes Nishant Dhungel, MD  lactobacilus acidophilus & bulgar (FLORANEX) TABS chewable tablet Take 1 tablet by mouth 3 (three) times daily with meals. 07/29/15  Yes Florencia Reasons, MD  levothyroxine (SYNTHROID, LEVOTHROID) 50 MCG tablet Take 50 mcg by mouth every morning.  01/16/15  Yes Historical Provider, MD  lisinopril (PRINIVIL,ZESTRIL) 20 MG tablet Take 20 mg by mouth daily.   Yes Historical Provider, MD  oxymetazoline (AFRIN) 0.05 % nasal spray Place 1 spray into both nostrils 2 (two) times daily as needed (for nose bleeds). Reported on 12/23/2015   Yes Historical Provider, MD  Polyethyl Glycol-Propyl Glycol (SYSTANE OP) Place 1 drop into both eyes 2 (two) times daily.    Yes Historical Provider, MD  potassium chloride (K-DUR) 10 MEQ tablet Take 1 tablet (10 mEq total) by mouth daily. 10/28/15  Yes Noemi Chapel, MD  ranitidine (ZANTAC) 150 MG tablet Take 150 mg by mouth daily.  01/14/15  Yes Historical Provider, MD  sertraline (ZOLOFT) 25 MG tablet Take 25 mg by mouth daily.   Yes Historical Provider, MD   BP 110/53 mmHg  Pulse 85  Temp(Src) 97.8 F (36.6 C) (Oral)  Resp 21  Wt 82.7 kg  SpO2 97%  Vitals reviewed Physical Exam  Physical Examination: General appearance - alert, well appearing, and in no distress Mental status - alert, oriented to person, place, and time Eyes - no conjunctival injection no  scleral icterus Mouth - mucous membranes moist, pharynx normal without lesions Chest - clear to auscultation, no wheezes, rales or rhonchi, symmetric air entry Heart - normal rate, regular rhythm, normal S1, S2, no murmurs, rubs, clicks or gallops Abdomen - soft, nontender, nondistended, no masses or organomegaly Neurological - alert, oriented, normal speech Extremities - peripheral pulses normal, 2+ bilateral pedal edema, no clubbing or cyanosis Skin - normal coloration and turgor, no rashes  ED Course  Procedures (including critical care time) Labs Review Labs Reviewed  BASIC METABOLIC PANEL - Abnormal; Notable for the following:  BUN 35 (*)    Creatinine, Ser 1.42 (*)    GFR calc non Af Amer 34 (*)    GFR calc Af Amer 40 (*)    All other components within normal limits  CBC - Abnormal; Notable for the following:    RBC 3.15 (*)    Hemoglobin 8.3 (*)    HCT 27.2 (*)    RDW 17.1 (*)    All other components within normal limits  URINALYSIS, ROUTINE W REFLEX MICROSCOPIC (NOT AT [facility])  OCCULT BLOOD X 1 CARD TO LAB, STOOL  CBG MONITORING, ED  I-STAT TROPOININ, ED    Imaging Review No results found. I have personally reviewed and evaluated these images and lab results as part of my medical decision-making.   EKG Interpretation   Date/Time:  Wednesday February 02 2016 13:32:12 EDT Ventricular Rate:  77 PR Interval:  221 QRS Duration: 170 QT Interval:  464 QTC Calculation: 525 R Axis:   86 Text Interpretation:  Sinus rhythm Prolonged PR interval IVCD, consider  atypical LBBB No significant change since last tracing Confirmed by [facility]   MD, Ambrose Wile [phone]) on 02/02/2016 2:17:38 PM      MDM   Final diagnoses:  Syncope, unspecified syncope type  Anemia, unspecified anemia type    Pt presenting after syncopal event at her facility.  Currently she is at her baseline but felt weak and diaphoretic.  Mildly low blood glucose- this was repleted with 1/2 amp D50 and patient  states she feels better afterward.  Is anemic, has had some drop in hemoglobin- will get hemoccult .  ekg is c/w prior- pacer interrogated and report shows no acute findings.  D/w hospitalist for admission.   3:10 PM d/w [facility] hospitalist- pt to be admitted to telemetry bed/obs.  Dr. Marily Memos is the attending   Alfonzo Beers, MD 02/02/16 [phone]

## 2016-02-02 NOTE — Progress Notes (Signed)
Patient arrived in the unit accompanied by NT via stretcher. Orientation given to the unit. Patient verbalizes understanding.

## 2016-02-02 NOTE — ED Notes (Signed)
Admitting NP at bedside

## 2016-02-02 NOTE — ED Notes (Signed)
CBG 66 

## 2016-02-02 NOTE — H&P (Signed)
History and Physical    [name] [id number] DOB: [birth year] DOA: 02/02/2016  Referring MD/NP/PA: EDP - Alfonzo Beers, MD PCP: Reymundo Poll, MD  Outpatient Specialists: Cardiology - Glenetta Hew, MD and Glori Bickers, MD Patient coming from: [facility] [facility] via EMS  Chief Complaint: passed out  HPI: [name] is a 82 y.o. female with medical history significant for, but not limited to COPD, dementia, and nonischemic cardiomyopathy, s/p ICD.  After completing about half of her lunch today patient became diaphoretic and describes not feeling well for a few seconds . Patient subsequently put her head down on the table, the next thing she remembers is staff calling her name. Patient unable to describe what she means by not feeling well other than to say she was lightheaded. Per EDP report, EMS found the patient diaphoretic with stable vital signs. CBG 66. Patient tells me this is not the first time she's experienced such an episode. It sounds like in previous times she did not seek medical attention. Patient has not recently changed any of her medications. She slept well last night  ED Course:  Hemodynamically stable, afebrile. CBG 66, given IV dextrose Pacemaker interrogated, no events.   Review of Systems: Positive for chronic BLE swelling  positive for arthritic pain in right hip. Otherwise 10 point review of systems negative.    Past Medical History  Diagnosis Date  . Hypertension   . Asthma   . CHF (congestive heart failure) ([place])   . COPD (chronic obstructive pulmonary disease) ([place])   . Colon cancer ([place])   . Hyperlipidemia   . Heart murmur   . Anginal pain ([place])   . Pulmonary embolism ([place])   . Pneumonia     "just once" (03/01/2015)  . Hypothyroidism   . History of blood transfusion     "related to anemia, this is my 1st" (03/01/2015)  . Arthritis     "comes and goes" (03/01/2015)  . Renal disorder   . Colostomy care ([place])   . Diabetes mellitus  without complication ([place])     pt denies this hx on 03/01/2015    Past Surgical History  Procedure Laterality Date  . Cholecystectomy    . Colon surgery      cancer  . Fracture surgery    . Dilation and curettage of uterus    . Tubal ligation    . Cataract extraction w/ intraocular lens  implant, bilateral Bilateral   . Ankle fracture surgery Left   . Abdominal hysterectomy    . Cardiac catheterization N/A 03/22/2015    Procedure: Right/Left Heart Cath and Coronary Angiography;  Surgeon: Leonie Man, MD;  Location: [facility];  Service: Cardiovascular;  Laterality: N/A;     reports that she has never smoked. She has never used smokeless tobacco. She reports that she does not drink alcohol or use illicit drugs.  No Known Allergies  Family History  Problem Relation Age of Onset  . CAD    . Hypertension    . Stroke    . Colon cancer Neg Hx   . GI Bleed Neg Hx     Prior to Admission medications   Medication Sig Start Date End Date Taking? Authorizing Provider  acetaminophen (TYLENOL) 325 MG tablet Take 650 mg by mouth every 6 (six) hours as needed (pain).    Historical Provider, MD  ADVAIR DISKUS 500-50 MCG/DOSE AEPB Inhale 1 puff into the lungs every 12 (twelve) hours. 12/28/14  Historical Provider, MD  albuterol (PROVENTIL) (2.5 MG/3ML) 0.083% nebulizer solution Take 3 mLs (2.5 mg total) by nebulization every 4 (four) hours as needed for wheezing. 04/28/15   Geradine Girt, DO  aspirin EC 81 MG tablet Take 1 tablet (81 mg total) by mouth every morning. 03/03/15   Ripudeep Krystal Eaton, MD  atorvastatin (LIPITOR) 20 MG tablet Take 20 mg by mouth daily.     Historical Provider, MD  carvedilol (COREG) 6.25 MG tablet Take 1.5 tablets (9.375 mg total) by mouth 2 (two) times daily with a meal. 12/23/15   Amy D Clegg, NP  feeding supplement, ENSURE ENLIVE, (ENSURE ENLIVE) LIQD Take 237 mLs by mouth 2 (two) times daily between meals. 03/02/15   Ripudeep Krystal Eaton, MD  ferrous sulfate 325  (65 FE) MG tablet Take 325 mg by mouth every morning.    Historical Provider, MD  furosemide (LASIX) 40 MG tablet Take 1 tablet (40 mg total) by mouth daily. 12/30/15   Shirley Friar, PA-C  hydrALAZINE (APRESOLINE) 25 MG tablet Take 0.5 tablets (12.5 mg total) by mouth 3 (three) times daily. 12/23/15   Amy D Ninfa Meeker, NP  HYDROcodone-acetaminophen (NORCO/VICODIN) 5-325 MG tablet Take 1 tablet by mouth every 4 (four) hours as needed for moderate pain.    Historical Provider, MD  isosorbide mononitrate (IMDUR) 30 MG 24 hr tablet Take 1 tablet (30 mg total) by mouth daily. 03/24/15   Nishant Dhungel, MD  lactobacilus acidophilus & bulgar (FLORANEX) TABS chewable tablet Take 1 tablet by mouth 3 (three) times daily with meals. 07/29/15   Florencia Reasons, MD  levothyroxine (SYNTHROID, LEVOTHROID) 50 MCG tablet Take 50 mcg by mouth every morning.  01/16/15   Historical Provider, MD  lisinopril (PRINIVIL,ZESTRIL) 20 MG tablet Take 20 mg by mouth daily.    Historical Provider, MD  oxymetazoline (AFRIN) 0.05 % nasal spray Place 1 spray into both nostrils 2 (two) times daily as needed (for nose bleeds). Reported on 12/23/2015    Historical Provider, MD  Polyethyl Glycol-Propyl Glycol (SYSTANE OP) Place 1 drop into the left eye 2 (two) times daily.    Historical Provider, MD  potassium chloride (K-DUR) 10 MEQ tablet Take 1 tablet (10 mEq total) by mouth daily. 10/28/15   Noemi Chapel, MD  ranitidine (ZANTAC) 150 MG tablet Take 150 mg by mouth daily.  01/14/15   Historical Provider, MD  sertraline (ZOLOFT) 25 MG tablet Take 25 mg by mouth daily.    Historical Provider, MD    Physical Exam: Filed Vitals:   02/02/16 1332 02/02/16 1335 02/02/16 1339 02/02/16 1340  BP: 117/42 117/42    Pulse: 76 77    Temp:  97.5 F (36.4 C) 97.8 F (36.6 C)   TempSrc:  Oral    Resp: 18 20    Weight:    82.7 kg (182 lb 5.1 oz)  SpO2: 99% 98%     Constitutional: NAD, calm, comfortable Filed Vitals:   02/02/16 1332 02/02/16 1335  02/02/16 1339 02/02/16 1340  BP: 117/42 117/42    Pulse: 76 77    Temp:  97.5 F (36.4 C) 97.8 F (36.6 C)   TempSrc:  Oral    Resp: 18 20    Weight:    82.7 kg (182 lb 5.1 oz)  SpO2: 99% 98%     Eyes: PER, lids and conjunctivae normal ENMT: Mucous membranes are moist. Posterior pharynx clear of any exudate or lesions.Normal dentition.  Neck: normal, supple, no masses Respiratory: A few  bibasilar inspiratory crackles. No wheezing, Normal respiratory effort. No accessory muscle use.  Cardiovascular: Regular rate and rhythm, + murmur, no gallops or rubs. 2+ BLE edema. 2+ pedal pulses. Abdomen: no tenderness, no masses palpated. No hepatomegaly. Bowel sounds positive. Colostomy bag empty. Musculoskeletal: no clubbing / cyanosis. No joint deformity upper and lower extremities. Good ROM, no contractures. Normal muscle tone.  Skin: no rashes, lesions, ulcers. No induration Neurologic: CN 2-12 grossly intact. Sensation intact, DTR normal. Strength 5/5 in all 4.  Psychiatric: Normal judgment and insight. Alert and oriented x 3. Normal mood.   Labs on Admission: I have personally reviewed following labs and imaging studies  CBC:  Recent Labs Lab 02/02/16 1341  WBC 6.5  HGB 8.3*  HCT 27.2*  MCV 86.3  PLT 123456   Basic Metabolic Panel:  Recent Labs Lab 02/02/16 1341  NA 140  K 4.2  CL 108  CO2 22  GLUCOSE 93  BUN 35*  CREATININE 1.42*  CALCIUM 9.6  CBG:  Recent Labs Lab 02/02/16 1347  GLUCAP 66   Urine analysis:    Component Value Date/Time   COLORURINE YELLOW 02/02/2016 [place] 02/02/2016 1415   LABSPEC 1.014 02/02/2016 1415   PHURINE 5.5 02/02/2016 [place] 02/02/2016 1415   HGBUR NEGATIVE 02/02/2016 1415   BILIRUBINUR NEGATIVE 02/02/2016 1415   KETONESUR NEGATIVE 02/02/2016 1415   PROTEINUR NEGATIVE 02/02/2016 1415   UROBILINOGEN 0.2 07/23/2015 1610   NITRITE NEGATIVE 02/02/2016 1415   LEUKOCYTESUR NEGATIVE 02/02/2016 1415     EKG: Independently reviewed.   EKG Interpretation  Date/Time:  Wednesday February 02 2016 13:32:12 EDT Ventricular Rate:  77 PR Interval:  221 QRS Duration: 170 QT Interval:  464 QTC Calculation: 525 R Axis:   86 Text Interpretation:  Sinus rhythm Prolonged PR interval IVCD, consider atypical LBBB No significant change since last tracing Confirmed by [facility]  MD, MARTHA [phone]) on 02/02/2016 2:17:38 PM    Right / left Cardiac Cath June 2016 Conclusion    1. 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 Anomalous codominant RCA 3. Moderate to severely reduced EF by LV gram consistent with echocardiogram. Despite this the cardiac output/index is only mild to moderately reduced. (Low normal by Fick and mildly to moderately reduced by Thermodilution) 4. Moderate to severely elevated LVEDP with only mildly elevated pulmonary arterial capillary wedge pressure of 15 mmHg    Assessment/Plan Active Problems:   COPD (chronic obstructive pulmonary disease) (HCC)   Dementia   CKD (chronic kidney disease) stage 3, GFR 30-59 ml/min   Anemia of chronic disease   Colon cancer (HCC)   Essential hypertension   Hypothyroidism   Syncope   Syncope. Episode at Nursing home today during lunch (while sitting). Only preceding symptom was lightheadedness. Trop normal 0.02. No acute EKG changes. No events note with pacemaker interrogation. Etiology of syncopal episode unclear.  -Place in observation. Medical bed-telemetry -Syncope order set utilized -Will obtain non-contrast head CTscan  Tachypnea (Resp 28-33) in last couple of hours. Normal 02 sats. No respiratory distress or increased WOB on exam. No wheezing -Obtain CXR  Asthma. Stable.  -continue home Proventil Nebulizer as needed.  -Continue home Advair  Nonischemic cardiomyopathy. Echo Feb 0000000 -Grade 1 diastolic dysfunction and EF 35-40% on echo Feb 2017. Severely dilated left atrium.   -maintained on 40mg  of lasix daily, had today's dose prior to ED -continue home Coreg -continue home asa, next dose  due tomorrow   CKD, stage 3. Cr slightly worse than before. Renal function has varied over last few months but baseline seems to be around 1.1, up to 1.42 today  Anemia of chronic disease. Baseline hgb mid 9 range, down to 8.3 today.  -stool FOBT pending -continue home iron  Hypothyroidism -check TSH -continue home Synthroid, next dose due tomorrow  Dementia. At baseline patient walks with rolling walker, feeds self. Her conversation with me is appropriate except she can't remember dates very well.   Hypertension. BP on low side today.  -hold remaining doses of Apresoline today. Resume tomorrow -Continue home Prinivil, next dose due tomorrow.   -Continue home Imdur, next dose due tomorrow  GERD. Stable.  -continue home H2 blocker, next dose due tomorrow.   Hyperlipidemia. -continue home lipitor.   Depression. Stable.  -Continue home Zoloft  DVT prophylaxis:   Lovenox - reduced dose  Code Status: full code Family Communication: Spoke with son [name], primary caregiver,, at bedside and he understands plans for Observation.  Disposition Plan: Discharge back to Nursing Home in 24-48 hours Consults called: None Admission status: Observation   Tye Savoy NP [facility] Pager [phone]  If 7PM-7AM, please contact night-coverage [email or web address] Password [email or web address]  02/02/2016, 3:10 PM

## 2016-02-03 ENCOUNTER — Encounter (HOSPITAL_COMMUNITY): Payer: Medicare HMO

## 2016-02-03 ENCOUNTER — Encounter (HOSPITAL_COMMUNITY): Payer: Self-pay | Admitting: Cardiology

## 2016-02-03 DIAGNOSIS — R55 Syncope and collapse: Secondary | ICD-10-CM | POA: Diagnosis not present

## 2016-02-03 DIAGNOSIS — F0390 Unspecified dementia without behavioral disturbance: Secondary | ICD-10-CM | POA: Diagnosis not present

## 2016-02-03 DIAGNOSIS — D638 Anemia in other chronic diseases classified elsewhere: Secondary | ICD-10-CM | POA: Diagnosis not present

## 2016-02-03 DIAGNOSIS — I10 Essential (primary) hypertension: Secondary | ICD-10-CM | POA: Diagnosis not present

## 2016-02-03 LAB — D-DIMER, QUANTITATIVE: D-Dimer, Quant: 2.23 ug/mL-FEU — ABNORMAL HIGH (ref 0.00–0.50)

## 2016-02-03 LAB — TSH: TSH: 1.072 u[IU]/mL (ref 0.350–4.500)

## 2016-02-03 LAB — GLUCOSE, CAPILLARY: Glucose-Capillary: 81 mg/dL (ref 65–99)

## 2016-02-03 MED ORDER — CARVEDILOL 3.125 MG PO TABS
3.1250 mg | ORAL_TABLET | Freq: Two times a day (BID) | ORAL | Status: DC
  Administered 2016-02-04 (×2): 3.125 mg via ORAL
  Filled 2016-02-03 (×2): qty 1

## 2016-02-03 NOTE — Care Management Note (Signed)
Case Management Note  Patient Details  Name: KENDRE ANSTEY MRN: [id number] Date of Birth: 12-Jan-1934  Subjective/Objective:    Pt admitted with syncope                Action/Plan:  Pt has been a resident at [facility] for approximately 1 year.  Pt stated she has had a colostomy for years and supplies are provided by facility.  CM consulted CSW - CM will continue to follow for discharge needs   Expected Discharge Date:                  Expected Discharge Plan:  Assisted Living / Rest Home  In-House Referral:  Clinical Social Work  Discharge planning Services     Post Acute Care Choice:    Choice offered to:     DME Arranged:    DME Agency:     HH Arranged:    [place] Agency:     Status of Service:  In process, will continue to follow  Medicare Important Message Given:    Date Medicare IM Given:    Medicare IM give by:    Date Additional Medicare IM Given:    Additional Medicare Important Message give by:     If discussed at [place] of Stay Meetings, dates discussed:    Additional Comments:  Maryclare Labrador, RN 02/03/2016, 9:59 AM

## 2016-02-03 NOTE — Consult Note (Signed)
Cardiology Consult    Patient ID: [name] MRN: [id number], DOB/AGE: 1934/10/09   Admit date: 02/02/2016 Date of Consult: 02/03/2016  Primary Physician: Reymundo Poll, MD Primary Cardiologist: Dr. Hanley Seamen  Requesting Provider: Marthenia Rolling   Patient Profile    82 yo female with hx of HTN/asthma/COPD/CHF/Colon Ca/HLD/PE/Hypothyriodism/DM/ ICD/pacemaker (MDT) who presented to the [facility] ED with syncopal episode yesterday afternoon.   Past Medical History   Past Medical History  Diagnosis Date  . Hypertension   . Asthma   . CHF (congestive heart failure) ([place])   . COPD (chronic obstructive pulmonary disease) ([place])   . Colon cancer ([place])   . Hyperlipidemia   . Heart murmur   . Anginal pain ([place])   . Pulmonary embolism ([place])   . Pneumonia     "just once" (03/01/2015)  . Hypothyroidism   . History of blood transfusion     "related to anemia, this is my 1st" (03/01/2015)  . Arthritis     "comes and goes" (03/01/2015)  . Renal disorder   . Colostomy care ([place])   . Diabetes mellitus without complication ([place])     pt denies this hx on 03/01/2015  . Presence of combination internal cardiac defibrillator (ICD) and pacemaker     2005 in [place] MDT    Past Surgical History  Procedure Laterality Date  . Cholecystectomy    . Colon surgery      cancer  . Fracture surgery    . Dilation and curettage of uterus    . Tubal ligation    . Cataract extraction w/ intraocular lens  implant, bilateral Bilateral   . Ankle fracture surgery Left   . Abdominal hysterectomy    . Cardiac catheterization N/A 03/22/2015    Procedure: Right/Left Heart Cath and Coronary Angiography;  Surgeon: Leonie Man, MD;  Location: [facility];  Service: Cardiovascular;  Laterality: N/A;     Allergies  No Known Allergies  History of Present Illness    [name] is a 82 yo female with hx of HTN/asthma/COPD/CHF/Colon Ca/HLD/PE/Hypothyriodism/DM/ICD pacemaker(MDT)  patient of Dr.  Sallyanne Kuster and most recently of Dr. Haroldine Laws with her last visit back in 12/23/2015. Last R/LHC was 03/2015 which showed essentially normal coronary arteries. 03/20/2015 Stress was low risk. Echo from 11/2015 showed an EF of 123456 reduced systolic function, septal-lateral dyssynchrony, moderate mitral stenosis/regurg and G1DD.   Most recently she was brought to the [facility] ED after having a syncopal episode after eating lunch at the [facility] yesterday. Reports she was in her normal state of health prior to eating her lunch yesterday, states after she finished she felt like the room was spinning, felt diaphoretic and  layed her head down on the dining room table. At this point she could hear people talking to her and could see the table but felt like "she just couldn't answer them back". When EMS arrived her CBG was noted to be in the 60s. She was transported to the ED and given 1/2 amp of D50 which made her feel better. Labs in the ED were Cr 1.42, BUN 35, WBC 6.5, Hgb 8.3, trop neg, TSH 1.072, UA negative, CT head normal and chest x-ray clear. Fecal Stool card was negative. EKG showed v-paced Rate- 77 prolonged QT interval. She was admitted to telemetry by IM.   Inpatient Medications    . aspirin EC  81 mg Oral Daily  . atorvastatin  20 mg Oral Daily  . carvedilol  9.375  mg Oral BID WC  . enoxaparin (LOVENOX) injection  30 mg Subcutaneous Q24H  . famotidine  20 mg Oral Daily  . feeding supplement (ENSURE ENLIVE)  237 mL Oral BID BM  . ferrous sulfate  325 mg Oral Q breakfast  . furosemide  40 mg Oral Daily  . isosorbide mononitrate  30 mg Oral Daily  . levothyroxine  50 mcg Oral QAC breakfast  . lisinopril  20 mg Oral Daily  . mometasone-formoterol  2 puff Inhalation BID  . polyvinyl alcohol  1 drop Both Eyes BID  . sertraline  25 mg Oral Daily  . sodium chloride flush  3 mL Intravenous Q12H    Family History    Family History  Problem Relation Age of Onset  . CAD    . Hypertension    . Stroke     . Colon cancer Neg Hx   . GI Bleed Neg Hx     Social History    Social History   Social History  . Marital Status: Divorced    Spouse Name: N/A  . Number of Children: N/A  . Years of Education: N/A   Occupational History  . Retired Psychologist, counselling    Social History Main Topics  . Smoking status: Never Smoker   . Smokeless tobacco: Never Used  . Alcohol Use: No  . Drug Use: No  . Sexual Activity: No   Other Topics Concern  . Not on file   Social History Narrative     Review of Systems    General:  No chills, fever, night sweats or weight changes.  Cardiovascular:  No chest pain, dyspnea on exertion, edema, orthopnea, palpitations, paroxysmal nocturnal dyspnea. Dermatological: No rash, lesions/masses Respiratory: No cough, dyspnea Urologic: No hematuria, dysuria Abdominal:   No nausea, vomiting, diarrhea, bright red blood per rectum, melena, or hematemesis Neurologic:  No visual changes, wkns, changes in mental status, +syncope All other systems reviewed and are otherwise negative except as noted above.  Physical Exam    Blood pressure 118/48, pulse 86, temperature 98.4 F (36.9 C), temperature source Oral, resp. rate 18, height 5\' 6"  (1.676 m), weight 172 lb 11.2 oz (78.336 kg), SpO2 98 %.  General: Pleasant older female, NAD Psych: Normal affect. Neuro: Alert and oriented X 3. Moves all extremities spontaneously. HEENT: Normal  Neck: Supple without bruits or JVD. Lungs:  Resp regular and unlabored, CTA. Heart: RRR no s3, s4, or 3/6 LSB murmur. Abdomen: Soft, non-tender, non-distended, BS + x 4.  Extremities: No clubbing, cyanosis, mild edema. DP/PT/Radials 2+ and equal bilaterally.  Labs    Troponin [facility] of Care Test)  Recent Labs  02/02/16 1418  TROPIPOC 0.02   No results for input(s): CKTOTAL, CKMB, TROPONINI in the last 72 hours. Lab Results  Component Value Date   WBC 6.5 02/02/2016   HGB 8.3* 02/02/2016   HCT 27.2* 02/02/2016   MCV  86.3 02/02/2016   PLT 260 02/02/2016     Recent Labs Lab 02/02/16 1341  NA 140  K 4.2  CL 108  CO2 22  BUN 35*  CREATININE 1.42*  CALCIUM 9.6  GLUCOSE 93   Lab Results  Component Value Date   CHOL 125 04/26/2015   HDL 37* 04/26/2015   LDLCALC 68 04/26/2015   TRIG 102 04/26/2015   Lab Results  Component Value Date   DDIMER 4.63* 03/18/2015     Radiology Studies    X-ray Chest Pa And Lateral  02/02/2016  CLINICAL DATA:  Syncope.  COPD.  CHF. EXAM: CHEST  2 VIEW COMPARISON:  11/22/2015 FINDINGS: Pacer/AICD device. No lead discontinuity. Moderate right hemidiaphragm elevation. Midline trachea. Mild cardiomegaly. No pleural effusion or pneumothorax. Minimal biapical pleural thickening. No congestive failure. There may be mild scarring at the left lung base. IMPRESSION: Cardiomegaly, without congestive failure or acute disease. Electronically Signed   By: Abigail Miyamoto M.D.   On: 02/02/2016 19:35   Ct Head Wo Contrast  02/02/2016  CLINICAL DATA:  Syncopal episode this morning. EXAM: CT HEAD WITHOUT CONTRAST TECHNIQUE: Contiguous axial images were obtained from the base of the skull through the vertex without intravenous contrast. COMPARISON:  None. FINDINGS: Sinuses/Soft tissues: Mucosal thickening of ethmoid air cells and frontal sinuses. Clear mastoid air cells. Intracranial: Bilateral vertebral atherosclerosis. No mass lesion, hemorrhage, hydrocephalus, acute infarct, intra-axial, or extra-axial fluid collection. IMPRESSION: 1.  No acute intracranial abnormality. 2. Sinus disease. Electronically Signed   By: Abigail Miyamoto M.D.   On: 02/02/2016 19:44    ECG & Cardiac Imaging    EKG: v-paced Rate- 77 prolonged QT interval  Echo: 11/16/2015  Study Conclusions  - Left ventricle: The cavity size was normal. Wall thickness was  increased in a pattern of moderate LVH. Systolic function was  moderately reduced. The estimated ejection fraction was in the  range of 35% to 40%. The  septal wall is severe hypokinetic. There  is septal-lateral dyssynchrony. Doppler parameters are consistent  with abnormal left ventricular relaxation (grade 1 diastolic  dysfunction). - Aortic valve: Trileaflet; moderately calcified leaflets. The  right coronary cusp appears fixed. Sclerosis without stenosis.  There was mild regurgitation. Mean gradient (S): 9 mm Hg. Peak  gradient (S): 17 mm Hg. Valve area (VTI): 2.08 cm^2. - Mitral valve: Moderately to severely calcified annulus.  Moderately calcified leaflets. The findings are consistent with  moderate stenosis. There was mild to moderate regurgitation. Mean  gradient (D): 9 mm Hg. Valve area by pressure half-time: 1.82  cm^2. Valve area by continuity equation (using LVOT flow): 1.65  cm^2. - Left atrium: The atrium was severely dilated. - Right ventricle: The cavity size was normal. Pacer wire or  catheter noted in right ventricle. Systolic function was normal. - Right atrium: The atrium was mildly dilated. - Tricuspid valve: Peak RV-RA gradient (S): 38 mm Hg. - Pulmonary arteries: PA peak pressure: 46 mm Hg (S). - Systemic veins: IVC measured 2.1 cm with < 50% respirophasic  variation, suggesting RA pressure 8 mmHg. - Pericardium, extracardiac: A trivial pericardial effusion was  identified.  Impressions:  - Normal LV size with moderate LV hypertrophy. EF 35-40% with  severe septal hypokinesis and septal-lateral dyssynchrony. Normal  RV size and systolic function. Aortic sclerosis without  significant stenosis, mild aortic insufficiency. Moderate to  severely calcified mitral valve and annulus with moderate mitral  stenosis and mild to moderate mitral regurgitation. Severely  dilated left atrium. Mild pulmonary hypertension.  Assessment & Plan    Ms. Benninger is a 82 yo female with hx of HTN/asthma/COPD/CHF/Colon Ca/HLD/PE/Hypothyriodism/DM/ICD pacemaker(MDT)  patient of Dr. Sallyanne Kuster and most recently of  Dr. Haroldine Laws with her last visit back in 12/23/2015. Last R/LHC was 03/2015 which showed essentially normal coronary arteries. 03/20/2015 Stress was low risk. Echo from 11/2015 showed an EF of 123456 reduced systolic function, septal-lateral dyssynchrony, moderate mitral stenosis/regurg and G1DD.    1. Syncope: Suspect this was most likely a vagal episode or related to her hypoglycemia, given that she improved with 1/2 amp of D50 and  she's had not further episodes. She has maintained her v-paced rhythm on telemetry with a rate in the 70-80s. She was not orthostatic on admission, EKG is unchanged, and Bp has remained essentially controlled with no episodes of hypotension. Of note her Hgb has dropped from 10.4>>8.3 in about a month. Fecal stool card was negative, on home Iron supplements.  2. HTN: controlled on current medications  3. CHF: Negative chest x-ray, euvolumeic on exam. Echo from 11/2015 showed an EF of 123456 reduced systolic function, septal-lateral dyssynchrony, moderate mitral stenosis/regurg and G1DD.   4. HLD: On statin  5: AKI: Cr 1.49 on admission, possible dehydration. Good UOP  Signed,  Reino Bellis, NP-C Pager [phone] 02/03/2016, 2:16 PM

## 2016-02-03 NOTE — Evaluation (Signed)
Clinical/Bedside Swallow Evaluation Patient Details  Name: [name] MRN: [id number] Date of Birth: Mar 14, 1934  Today's Date: 02/03/2016 Time: SLP Start Time (ACUTE ONLY): 1048 SLP Stop Time (ACUTE ONLY): 1110 SLP Time Calculation (min) (ACUTE ONLY): 22 min  Past Medical History:  Past Medical History  Diagnosis Date  . Hypertension   . Asthma   . CHF (congestive heart failure) ([place])   . COPD (chronic obstructive pulmonary disease) ([place])   . Colon cancer ([place])   . Hyperlipidemia   . Heart murmur   . Anginal pain ([place])   . Pulmonary embolism ([place])   . Pneumonia     "just once" (03/01/2015)  . Hypothyroidism   . History of blood transfusion     "related to anemia, this is my 1st" (03/01/2015)  . Arthritis     "comes and goes" (03/01/2015)  . Renal disorder   . Colostomy care ([place])   . Diabetes mellitus without complication ([place])     pt denies this hx on 03/01/2015   Past Surgical History:  Past Surgical History  Procedure Laterality Date  . Cholecystectomy    . Colon surgery      cancer  . Fracture surgery    . Dilation and curettage of uterus    . Tubal ligation    . Cataract extraction w/ intraocular lens  implant, bilateral Bilateral   . Ankle fracture surgery Left   . Abdominal hysterectomy    . Cardiac catheterization N/A 03/22/2015    Procedure: Right/Left Heart Cath and Coronary Angiography;  Surgeon: Leonie Man, MD;  Location: [facility];  Service: Cardiovascular;  Laterality: N/A;   HPI:  Pt with hx of multiple medical problems including HTN, cardiomyopathy with pacemaker, COPD presentign after syncopal event. Per EMS she was eating lunch at her facility and then slumped over in her chair and was briefly unresponsive. She states she does not remember this but did feel very weak and was sweating. No chest pain. She was responsive for EMS and diaphoretic. She denies shortness of breath, no headache, no focal weakness. She states she feels back to  her baseline now. She denies any change in her chronic lower extremity swelling. There are no other associated systemic symptoms, there are no other alleviating or modifying factors.    Assessment / Plan / Recommendation Clinical Impression  Pt referred for a clinical assessment of swallow after a syncopal episode during a meal at her ALF. Pt reports swallow is WNL except for 1 episode of "getting strangled" this morning since she wasn't positioned upright during meal. Pt tolerated all PO trials without s/s aspiration. Encouraged upright positioning and small bites/sips to decrease risk of aspiration. No further SLP services indicated at this time.    Aspiration Risk  Mild aspiration risk    Diet Recommendation Regular;Thin liquid   Liquid Administration via: Straw;Cup Medication Administration: Whole meds with liquid Supervision: Patient able to self feed Compensations: Small sips/bites Postural Changes: Seated upright at 90 degrees;Remain upright for at least 30 minutes after po intake    Other  Recommendations Oral Care Recommendations: Oral care BID   Follow up Recommendations  None    Frequency and Duration            Prognosis        Swallow Study   General Date of Onset: 02/02/16 HPI: Pt with hx of multiple medical problems including HTN, cardiomyopathy with pacemaker, COPD presentign after syncopal event. Per EMS she was eating  lunch at her facility and then slumped over in her chair and was briefly unresponsive. She states she does not remember this but did feel very weak and was sweating. No chest pain. She was responsive for EMS and diaphoretic. She denies shortness of breath, no headache, no focal weakness. She states she feels back to her baseline now. She denies any change in her chronic lower extremity swelling. There are no other associated systemic symptoms, there are no other alleviating or modifying factors.  Type of Study: Bedside Swallow  Evaluation Previous Swallow Assessment: none known Diet Prior to this Study: Regular;Thin liquids Temperature Spikes Noted: No Respiratory Status: Room air History of Recent Intubation: No Behavior/Cognition: Alert;Cooperative;Pleasant mood Oral Cavity Assessment: Within Functional Limits Oral Care Completed by SLP: No Oral Cavity - Dentition: Missing dentition;Adequate natural dentition Vision: Functional for self-feeding Self-Feeding Abilities: Able to feed self Patient Positioning: Upright in bed Baseline Vocal Quality: Normal Volitional Cough: Strong Volitional Swallow: Able to elicit    Oral/Motor/Sensory Function Overall Oral Motor/Sensory Function: Within functional limits   Ice Chips Ice chips: Within functional limits   Thin Liquid Thin Liquid: Within functional limits Presentation: Straw    Nectar Thick     Honey Thick     Puree Puree: Within functional limits Presentation: Self Fed;Spoon   Solid   GO   Solid: Within functional limits Presentation: Hiko, Ruffin Pager [phone] 02/03/2016,11:12 AM

## 2016-02-03 NOTE — H&P (Signed)
PROGRESS NOTE    [name]  [id number] DOB: [birth year] DOA: 02/02/2016 PCP: Reymundo Poll, MD   Outpatient Specialists: Cardiologist is Dr. Haroldine Laws.  Brief Narrative:   82 year old African American Female with history of prior syncope, Cardiomyopathy with EF of 35-40%, COPD, HTN, asthma and patient is status pacemaker placement. Patient is a poor historian. Patient was admitted with syncope. According to the patient, she experience nausea with associated diaphoresis while having a meal at a ?facility. No further syncope reported. CT head is non revealing. First troponin is 0.02. Work up is in progress. Cardiology has been consulted.  Assessment & Plan:   Active Problems:   COPD (chronic obstructive pulmonary disease) (HCC)   Dementia   CKD (chronic kidney disease) stage 3, GFR 30-59 ml/min   Anemia of chronic disease   Colon cancer (HCC)   Essential hypertension   Hypothyroidism   Syncope   Faintness   - Syncope - Follow Cardiology input. Trend troponin. Carotid doppler ultrasound. Last Echo was 02/17. CT scan report is noted.  DVT prophylaxis: Lovenox. Code Status:  Family Communication: Disposition Plan: Likely DC back after workup.  Consultants:   Cardiology  Procedures:   Antimicrobials:    Subjective: No new complaints. No chest pain. No SOB. No palpitations. No dizziness or syncope.  Objective: Filed Vitals:   02/03/16 0427 02/03/16 0759 02/03/16 1034 02/03/16 1240  BP: 140/53  110/56 118/48  Pulse: 92  82 86  Temp: 99.4 F (37.4 C)   98.4 F (36.9 C)  TempSrc: Oral   Oral  Resp: 20  20 18   Height:      Weight: 78.336 kg (172 lb 11.2 oz)     SpO2: 96% 95% 98% 98%    Intake/Output Summary (Last 24 hours) at 02/03/16 1246 Last data filed at 02/03/16 1038  Gross per 24 hour  Intake    123 ml  Output    675 ml  Net   -552 ml   Filed Weights   02/02/16 1340 02/02/16 1730 02/03/16 0427  Weight: 82.7 kg (182 lb 5.1 oz) 79.379 kg (175 lb) 78.336  kg (172 lb 11.2 oz)    Examination:  General exam: Appears calm and comfortable. Not in distress. Respiratory system: Clear to auscultation. Respiratory effort normal. Cardiovascular system: S1 & S2. Sysolic Murmur. Gastrointestinal system: Abdomen is nondistended, soft and nontender. No organomegaly or masses felt. Normal bowel sounds heard. Central nervous system: Alert and oriented. No focal neurological deficits. Extremities: Symmetric 5 x 5 power. Skin: No rashes, lesions or ulcers Psychiatry: Judgement and insight appear normal. Mood & affect appropriate.     Data Reviewed: I have personally reviewed following labs and imaging studies  CBC:  Recent Labs Lab 02/02/16 1341  WBC 6.5  HGB 8.3*  HCT 27.2*  MCV 86.3  PLT 123456   Basic Metabolic Panel:  Recent Labs Lab 02/02/16 1341  NA 140  K 4.2  CL 108  CO2 22  GLUCOSE 93  BUN 35*  CREATININE 1.42*  CALCIUM 9.6   GFR: Estimated Creatinine Clearance: 33.9 mL/min (by C-G formula based on Cr of 1.42). Liver Function Tests: No results for input(s): AST, ALT, ALKPHOS, BILITOT, PROT, ALBUMIN in the last 168 hours. No results for input(s): LIPASE, AMYLASE in the last 168 hours. No results for input(s): AMMONIA in the last 168 hours. Coagulation Profile: No results for input(s): INR, PROTIME in the last 168 hours. Cardiac Enzymes: No results for input(s): CKTOTAL, CKMB, CKMBINDEX, TROPONINI  in the last 168 hours. BNP (last 3 results) No results for input(s): PROBNP in the last 8760 hours. HbA1C: No results for input(s): HGBA1C in the last 72 hours. CBG:  Recent Labs Lab 02/02/16 1347 02/03/16 0625  GLUCAP 66 81   Lipid Profile: No results for input(s): CHOL, HDL, LDLCALC, TRIG, CHOLHDL, LDLDIRECT in the last 72 hours. Thyroid Function Tests:  Recent Labs  02/02/16 1841  TSH 1.072   Anemia Panel: No results for input(s): VITAMINB12, FOLATE, FERRITIN, TIBC, IRON, RETICCTPCT in the last 72 hours. Urine  analysis:    Component Value Date/Time   COLORURINE YELLOW 02/02/2016 [place] 02/02/2016 1415   LABSPEC 1.014 02/02/2016 1415   PHURINE 5.5 02/02/2016 1415   GLUCOSEU NEGATIVE 02/02/2016 1415   HGBUR NEGATIVE 02/02/2016 1415   BILIRUBINUR NEGATIVE 02/02/2016 1415   KETONESUR NEGATIVE 02/02/2016 1415   PROTEINUR NEGATIVE 02/02/2016 1415   UROBILINOGEN 0.2 07/23/2015 1610   NITRITE NEGATIVE 02/02/2016 1415   LEUKOCYTESUR NEGATIVE 02/02/2016 1415   Sepsis Labs: @LABRCNTIP (procalcitonin:4,lacticidven:4)  )No results found for this or any previous visit (from the past 240 hour(s)).       Radiology Studies: X-ray Chest Pa And Lateral  02/02/2016  CLINICAL DATA:  Syncope.  COPD.  CHF. EXAM: CHEST  2 VIEW COMPARISON:  11/22/2015 FINDINGS: Pacer/AICD device. No lead discontinuity. Moderate right hemidiaphragm elevation. Midline trachea. Mild cardiomegaly. No pleural effusion or pneumothorax. Minimal biapical pleural thickening. No congestive failure. There may be mild scarring at the left lung base. IMPRESSION: Cardiomegaly, without congestive failure or acute disease. Electronically Signed   By: Abigail Miyamoto M.D.   On: 02/02/2016 19:35   Ct Head Wo Contrast  02/02/2016  CLINICAL DATA:  Syncopal episode this morning. EXAM: CT HEAD WITHOUT CONTRAST TECHNIQUE: Contiguous axial images were obtained from the base of the skull through the vertex without intravenous contrast. COMPARISON:  None. FINDINGS: Sinuses/Soft tissues: Mucosal thickening of ethmoid air cells and frontal sinuses. Clear mastoid air cells. Intracranial: Bilateral vertebral atherosclerosis. No mass lesion, hemorrhage, hydrocephalus, acute infarct, intra-axial, or extra-axial fluid collection. IMPRESSION: 1.  No acute intracranial abnormality. 2. Sinus disease. Electronically Signed   By: Abigail Miyamoto M.D.   On: 02/02/2016 19:44        Scheduled Meds: . aspirin EC  81 mg Oral Daily  . atorvastatin  20 mg  Oral Daily  . carvedilol  9.375 mg Oral BID WC  . enoxaparin (LOVENOX) injection  30 mg Subcutaneous Q24H  . famotidine  20 mg Oral Daily  . feeding supplement (ENSURE ENLIVE)  237 mL Oral BID BM  . ferrous sulfate  325 mg Oral Q breakfast  . furosemide  40 mg Oral Daily  . isosorbide mononitrate  30 mg Oral Daily  . levothyroxine  50 mcg Oral QAC breakfast  . lisinopril  20 mg Oral Daily  . mometasone-formoterol  2 puff Inhalation BID  . polyvinyl alcohol  1 drop Both Eyes BID  . sertraline  25 mg Oral Daily  . sodium chloride flush  3 mL Intravenous Q12H   Continuous Infusions:       Time spent: 30 Mins    Bonnell Public, MD [facility] Pager [phone]  If 7PM-7AM, please contact night-coverage [email or web address] Password [facility] 02/03/2016, 12:46 PM

## 2016-02-03 NOTE — Clinical Social Work Note (Signed)
Clinical Social Work Assessment  Patient Details  Name: [name] MRN: [id number] Date of Birth: 1934-03-14  Date of referral:  02/03/16               Reason for consult:  Facility Placement, Discharge Planning                Permission sought to share information with:  Facility Sport and exercise psychologist, Family Supports Permission granted to share information::  Yes, Verbal Permission Granted  Name::     [phone]  Agency::  [facility]  Relationship::  Son  Contact Information:  [phone]  Housing/Transportation Living arrangements for the past 2 months:  [place] of Information:  Patient, Medical Team, Adult Children Patient Interpreter Needed:  None Criminal Activity/Legal Involvement Pertinent to Current Situation/Hospitalization:  No - Comment as needed Significant Relationships:  Adult Children Lives with:  Facility Resident Do you feel safe going back to the place where you live?  Yes Need for family participation in patient care:  Yes (Comment)  Care giving concerns:  Patient will discharge back to [facility] (ALF).   Social Worker assessment / plan:  CSW met with patient. No supports at bedside. CSW introduced role and explained that discharge planning would be discussed. Confirmed that patient would be going back to ALF. Patient reports that she used to like it more when she had just moved there. Speech therapist that was present reported that patient does not like the fish they provide at the facility. Discussed history and why she is now living there. Her son lives down the street. Patient had no questions for CSW. CSW called patient's son and legal guardian. He confirmed that she would be discharging back to [facility] and will be at [facility] today. Discharge date unknown at this time. No further concerns. CSW encouraged patient and her son to contact CSW as needed. CSW will continue to follow patient and facilitate discharge once  medically stable.  Employment status:  Retired Nurse, adult PT Recommendations:  Not assessed at this time Information / Referral to community resources:  Other (Comment Required) (Going back to previous ALF)  Patient/Family's Response to care:  Patient and her son agreeable to patient's return to [facility]. Patient's son involved in patient's care and supportive. Patient and her son polite and appreciated social work intervention.  Patient/Family's Understanding of and Emotional Response to Diagnosis, Current Treatment, and Prognosis:  Patient's son knowledgeable of medical interventions. Both patient and her son aware that she will discharge back to [facility] once medically stable.  Emotional Assessment Appearance:  Appears stated age Attitude/Demeanor/Rapport:   (Calm, cooperative.) Affect (typically observed):  Calm, Pleasant Orientation:  Oriented to Self, Oriented to Place, Oriented to  Time, Oriented to Situation Alcohol / Substance use:  Never Used Psych involvement (Current and /or in the community):  No (Comment)  Discharge Needs  Concerns to be addressed:  Care Coordination Readmission within the last 30 days:  No Current discharge risk:  None Barriers to Discharge:  No Barriers Identified   Candie Chroman, LCSW 02/03/2016, 12:02 PM

## 2016-02-03 NOTE — NC FL2 (Signed)
[place] MEDICAID FL2 LEVEL OF CARE SCREENING TOOL     IDENTIFICATION  Patient Name: [name] Birthdate: [birth year] Sex: female Admission Date (Current Location): 02/02/2016  [facility] and [place] Number:  Herbalist and Address:  [facility] [place] [address], [place], [place] [address]      Provider Number: [id number]  Attending Physician Name and Address:  Bonnell Public, MD  Relative Name and Phone Number:       Current Level of Care: Hospital Recommended Level of Care: [place] Prior Approval Number:    Date Approved/Denied:   PASRR Number: [id number]  Discharge Plan: Other (Comment) (ALF)    Current Diagnoses: Patient Active Problem List   Diagnosis Date Noted  . Syncope 02/02/2016  . Faintness   . Essential hypertension 11/16/2015  . Hypothyroidism 11/16/2015  . Acute on chronic systolic (congestive) heart failure ([place]) 11/15/2015  . Hypertensive heart disease 10/04/2015  . Elevated troponin 10/04/2015  . Acute respiratory failure ([place])   . Acute systolic congestive heart failure ([place])   . NICM (nonischemic cardiomyopathy) ([place]) 08/11/2015  . Normal coronary arteries 08/11/2015  . Dyslipidemia 07/27/2015  . CKD (chronic kidney disease) stage 3, GFR 30-59 ml/min 07/27/2015  . Anemia of chronic disease 07/27/2015  . Benign essential HTN 07/27/2015  . Hypothyroidism, adult 07/27/2015  . FUO (fever of unknown origin) 07/27/2015  . IDA (iron deficiency anemia) 07/27/2015  . Chronic systolic CHF (congestive heart failure) ([place]) 07/27/2015  . Colon cancer ([place]) 07/27/2015  . Sepsis ([place]) 07/24/2015  . Acute encephalopathy 07/24/2015  . Hypokalemia 07/24/2015  . Dementia 07/23/2015  . COPD (chronic obstructive pulmonary disease) ([place]) 04/25/2015  . ICD in place 04/14/2015  . Colostomy in place [facility]) 03/01/2015    Orientation RESPIRATION BLADDER Height & Weight     Self, Time, Situation, Place  Normal Continent Weight: 172 lb 11.2 oz (78.336 kg) (scale b) Height:  5\' 6"  (167.6 cm)  BEHAVIORAL SYMPTOMS/MOOD NEUROLOGICAL BOWEL NUTRITION STATUS   (None)  (Dementia, fainting) Colostomy, Continent Diet (Heart healthy)  AMBULATORY STATUS COMMUNICATION OF NEEDS Skin   Independent Verbally Normal                       Personal Care Assistance Level of Assistance  Bathing, Feeding, Dressing Bathing Assistance: Independent Feeding assistance: Independent Dressing Assistance: Independent     Functional Limitations Info  Sight, Hearing, Speech Sight Info: Adequate Hearing Info: Adequate Speech Info: Adequate    SPECIAL CARE FACTORS FREQUENCY  Speech therapy             Speech Therapy Frequency: No follow up recommendations.      Contractures Contractures Info: Not present    Additional Factors Info  Code Status, Allergies Code Status Info: Full Allergies Info: NKDA           Current Medications (02/03/2016):  This is the current hospital active medication list Current Facility-Administered Medications  Medication Dose Route Frequency Provider Last Rate Last Dose  . acetaminophen (TYLENOL) tablet 650 mg  650 mg Oral Q6H PRN Willia Craze, NP       Or  . acetaminophen (TYLENOL) suppository 650 mg  650 mg Rectal Q6H PRN Willia Craze, NP      . albuterol (PROVENTIL) (2.5 MG/3ML) 0.083% nebulizer solution 2.5 mg  2.5 mg Nebulization Q4H PRN Willia Craze, NP      . aspirin EC tablet 81 mg  81  mg Oral Daily Willia Craze, NP   81 mg at 02/03/16 1034  . atorvastatin (LIPITOR) tablet 20 mg  20 mg Oral Daily Willia Craze, NP   20 mg at 02/03/16 1034  . carvedilol (COREG) tablet 9.375 mg  9.375 mg Oral BID WC Waldemar Dickens, MD   9.375 mg at 02/03/16 0901  . enoxaparin (LOVENOX) injection 30 mg  30 mg Subcutaneous Q24H Willia Craze, NP   30 mg at 02/02/16 2127  . famotidine (PEPCID) tablet 20 mg  20 mg Oral Daily Willia Craze, NP   20 mg at  02/03/16 1034  . feeding supplement (ENSURE ENLIVE) (ENSURE ENLIVE) liquid 237 mL  237 mL Oral BID BM Willia Craze, NP   237 mL at 02/03/16 1043  . ferrous sulfate tablet 325 mg  325 mg Oral Q breakfast Waldemar Dickens, MD   325 mg at 02/03/16 0901  . furosemide (LASIX) tablet 40 mg  40 mg Oral Daily Willia Craze, NP   40 mg at 02/03/16 1034  . HYDROcodone-acetaminophen (NORCO/VICODIN) 5-325 MG per tablet 1 tablet  1 tablet Oral Q4H PRN Willia Craze, NP   1 tablet at 02/03/16 0444  . isosorbide mononitrate (IMDUR) 24 hr tablet 30 mg  30 mg Oral Daily Willia Craze, NP   30 mg at 02/03/16 1034  . levothyroxine (SYNTHROID, LEVOTHROID) tablet 50 mcg  50 mcg Oral QAC breakfast Willia Craze, NP   50 mcg at 02/03/16 0538  . lisinopril (PRINIVIL,ZESTRIL) tablet 20 mg  20 mg Oral Daily Willia Craze, NP   20 mg at 02/03/16 1034  . mometasone-formoterol (DULERA) 200-5 MCG/ACT inhaler 2 puff  2 puff Inhalation BID Willia Craze, NP   2 puff at 02/03/16 0759  . polyvinyl alcohol (LIQUIFILM TEARS) 1.4 % ophthalmic solution 1 drop  1 drop Both Eyes BID Theone Murdoch Hammons, RPH   1 drop at 02/03/16 1038  . sertraline (ZOLOFT) tablet 25 mg  25 mg Oral Daily Willia Craze, NP   25 mg at 02/03/16 1034  . sodium chloride flush (NS) 0.9 % injection 3 mL  3 mL Intravenous Q12H Willia Craze, NP   3 mL at 02/03/16 1038     Discharge Medications: Please see discharge summary for a list of discharge medications.  Relevant Imaging Results:  Relevant Lab Results:   Additional Information SS#: [id number]  Candie Chroman, LCSW

## 2016-02-03 NOTE — Care Management Obs Status (Signed)
[place] NOTIFICATION   Patient Details  Name: [name] MRN: [id number] Date of Birth: Jan 09, 1934   Medicare Observation Status Notification Given:  Yes    Maryclare Labrador, RN 02/03/2016, 9:59 AM

## 2016-02-04 ENCOUNTER — Encounter (HOSPITAL_COMMUNITY): Payer: Medicare HMO

## 2016-02-04 DIAGNOSIS — I10 Essential (primary) hypertension: Secondary | ICD-10-CM | POA: Diagnosis not present

## 2016-02-04 DIAGNOSIS — F0390 Unspecified dementia without behavioral disturbance: Secondary | ICD-10-CM

## 2016-02-04 DIAGNOSIS — R55 Syncope and collapse: Secondary | ICD-10-CM | POA: Diagnosis not present

## 2016-02-04 MED ORDER — CARVEDILOL 3.125 MG PO TABS: 3.1250 mg | ORAL_TABLET | Freq: Two times a day (BID) | ORAL | Status: DC

## 2016-02-04 NOTE — Progress Notes (Signed)
02/04/16  1611  Called report to [facility] [phone].

## 2016-02-04 NOTE — Progress Notes (Signed)
[facility] notified that patients has bilateral lower edema and asked if TED hose can be ordered. New order for TED given and placed on pt she stated that this feels much better. Arthor Captain LPN

## 2016-02-04 NOTE — Clinical Social Work Note (Signed)
CSW facilitated patient discharge including contacting patient family and facility to confirm patient discharge plans. Clinical information will be sent with patient to facility and family agreeable with plan. CSW arranged ambulance transport via [facility] to [facility]. RN to call report prior to discharge.  CSW will sign off for now as social work intervention is no longer needed. Please consult [place] again if new needs arise.  [name], [name]

## 2016-02-04 NOTE — Discharge Summary (Signed)
Physician Discharge Summary  Patient ID: KYLIAH SCHENDEL MRN: [id number] DOB/AGE: 1933-12-22 82 y.o.  Admit date: 02/02/2016 Discharge date: 02/04/2016  Admission Diagnoses:  Discharge Diagnoses:  Active Problems:   Syncope  COPD (chronic obstructive pulmonary disease) (HCC)   Dementia   CKD (chronic kidney disease) stage 3, GFR 30-59 ml/min   Anemia of chronic disease   Colon cancer (HCC)   Essential hypertension   Hypothyroidism         Discharged Condition: Stable  Hospital Course: 82 year old African American Female with history of prior syncope, Cardiomyopathy with EF of 35-40%, COPD, HTN, asthma and patient is status pacemaker placement. Patient is a poor historian. Patient was admitted with syncope. According to the patient, she experience nausea with associated diaphoresis while having a meal at a facility. No further syncope reported. CT head is non revealing. First troponin is 0.02. Work up is non revealing. It was noted that patient may have been hypoglycemic at the time she had the symptoms. Cardiology consult was called during the hospital stay. Patient has remained stable, and will be discharged back to the facility.  Consults: Cardiology  Significant Diagnostic Studies:   Discharge medication - See Med. Rec. Treatments:    Discharge Exam: Blood pressure 120/82, pulse 90, temperature 98.6 F (37 C), temperature source Oral, resp. rate 18, height 5\' 6"  (1.676 m), weight 78.881 kg (173 lb 14.4 oz), SpO2 100 %.   General exam: Calm and comfortable. Not in distress. Respiratory system: Clear to auscultation. Respiratory effort normal. Cardiovascular system: S1 & S2. Sysolic Murmur. Gastrointestinal system: Abdomen is nondistended, soft and nontender. No organomegaly or masses felt. Normal bowel sounds heard. Central nervous system: Alert and oriented. No focal neurological deficits. Extremities: Symmetric 5 x 5 power.  Disposition: 01-Home or Self Care  Discharge  Instructions    Diet - low sodium heart healthy    Complete by:  As directed      Discharge instructions    Complete by:  As directed   Call MD if symptoms return     Increase activity slowly    Complete by:  As directed             Medication List    TAKE these medications        acetaminophen 325 MG tablet  Commonly known as:  TYLENOL  Take 650 mg by mouth every 6 (six) hours as needed (pain).     ADVAIR DISKUS 500-50 MCG/DOSE Aepb  Generic drug:  Fluticasone-Salmeterol  Inhale 1 puff into the lungs every 12 (twelve) hours.     albuterol (2.5 MG/3ML) 0.083% nebulizer solution  Commonly known as:  PROVENTIL  Take 3 mLs (2.5 mg total) by nebulization every 4 (four) hours as needed for wheezing.     aspirin EC 81 MG tablet  Take 1 tablet (81 mg total) by mouth every morning.     atorvastatin 20 MG tablet  Commonly known as:  LIPITOR  Take 20 mg by mouth daily.     carvedilol 3.125 MG tablet  Commonly known as:  COREG  Take 1 tablet (3.125 mg total) by mouth 2 (two) times daily with a meal.     feeding supplement (ENSURE ENLIVE) Liqd  Take 237 mLs by mouth 2 (two) times daily between meals.     ferrous sulfate 325 (65 FE) MG tablet  Take 325 mg by mouth every morning.     furosemide 40 MG tablet  Commonly known as:  LASIX  Take 1 tablet (40 mg total) by mouth daily.     hydrALAZINE 25 MG tablet  Commonly known as:  APRESOLINE  Take 0.5 tablets (12.5 mg total) by mouth 3 (three) times daily.     HYDROcodone-acetaminophen 5-325 MG tablet  Commonly known as:  NORCO/VICODIN  Take 1 tablet by mouth every 4 (four) hours as needed for moderate pain.     isosorbide mononitrate 30 MG 24 hr tablet  Commonly known as:  IMDUR  Take 1 tablet (30 mg total) by mouth daily.     lactobacilus acidophilus & bulgar Tabs chewable tablet  Take 1 tablet by mouth 3 (three) times daily with meals.     levothyroxine 50 MCG tablet  Commonly known as:  SYNTHROID, LEVOTHROID  Take  50 mcg by mouth every morning.     lisinopril 20 MG tablet  Commonly known as:  PRINIVIL,ZESTRIL  Take 20 mg by mouth daily.     oxymetazoline 0.05 % nasal spray  Commonly known as:  AFRIN  Place 1 spray into both nostrils 2 (two) times daily as needed (for nose bleeds). Reported on 12/23/2015     potassium chloride 10 MEQ tablet  Commonly known as:  K-DUR  Take 1 tablet (10 mEq total) by mouth daily.     ranitidine 150 MG tablet  Commonly known as:  ZANTAC  Take 150 mg by mouth daily.     sertraline 25 MG tablet  Commonly known as:  ZOLOFT  Take 25 mg by mouth daily.     SYSTANE OP  Place 1 drop into both eyes 2 (two) times daily.           Follow-up Information    Follow up with Reymundo Poll, MD In 1 week.   Specialty:  Family Medicine   Contact information:   [address] [place] [address] [phone]       Signed: Bonnell Public 02/04/2016, 3:21 PM

## 2016-02-04 NOTE — Clinical Social Work Note (Signed)
Spoke with staff at [facility] [phone]). They do not need faxed information prior to discharge.   Dayton Scrape, [place]

## 2016-02-09 NOTE — Progress Notes (Signed)
SLP Late Entry Addendum  02/03/16 1100  SLP Visit Information  SLP Received On 02/03/16  Subjective  Subjective "I swallow fine."  Patient/Family Stated Goal "Figure our what's going on."  General Information  Date of Onset 02/02/16  HPI Pt with hx of multiple medical problems including HTN, cardiomyopathy with pacemaker, COPD presentign after syncopal event. Per EMS she was eating lunch at her facility and then slumped over in her chair and was briefly unresponsive. She states she does not remember this but did feel very weak and was sweating. No chest pain. She was responsive for EMS and diaphoretic. She denies shortness of breath, no headache, no focal weakness. She states she feels back to her baseline now. She denies any change in her chronic lower extremity swelling. There are no other associated systemic symptoms, there are no other alleviating or modifying factors.   Type of Study Bedside Swallow Evaluation  Previous Swallow Assessment none known  Diet Prior to this Study Regular;Thin liquids  Temperature Spikes Noted No  Respiratory Status Room air  History of Recent Intubation No  Behavior/Cognition Alert;Cooperative;Pleasant mood  Oral Cavity Assessment WFL  Oral Care Completed by SLP No  Oral Cavity - Dentition Missing dentition;Adequate natural dentition  Vision Functional for self-feeding  Self-Feeding Abilities Able to feed self  Patient Positioning Upright in bed  Baseline Vocal Quality Normal  Volitional Cough Strong  Volitional Swallow Able to elicit  Pain Assessment  Pain Assessment No/denies pain  Oral Assessment (Complete on admission/transfer/change in patient condition)  Does patient have any of the following "high risk" factors? None of the above  Does patient have any of the following "at risk" factors? None of the above  Oral Motor/Sensory Function  Overall Oral Motor/Sensory Function WFL  Ice Chips  Ice chips WFL  Thin Liquid  Thin Liquid WFL   Presentation Straw  Puree  Puree WFL  Presentation Self Fed;Spoon  Solid  Solid WFL  Presentation Self Fed  SLP - End of Session  Patient left in bed;with call bell/phone within reach  Nurse Communication Diet recommendation;Swallow strategies reviewed  SLP Assessment  Clinical Impression Statement (ACUTE ONLY) Pt referred for a clinical assessment of swallow after a syncopal episode during a meal at her ALF. Pt reports swallow is WNL except for 1 episode of "getting strangled" this morning since she wasn't positioned upright during meal. Pt tolerated all PO trials without s/s aspiration. Encouraged upright positioning and small bites/sips to decrease risk of aspiration. No further SLP services indicated at this time.  Impact on safety and function Mild aspiration risk  Swallow Evaluation Recommendations  SLP Diet Recommendations Regular;Thin liquid  Liquid Administration via Straw;Cup  Medication Administration Whole meds with liquid  Supervision Patient able to self feed  Compensations Small sips/bites  Postural Changes Seated upright at 90 degrees;Remain upright for at least 30 minutes after po intake  Treatment Plan  Oral Care Recommendations Oral care BID  Treatment Recommendations No treatment recommended at this time  Follow up Recommendations None  Interventions Patient/family education  Individuals Consulted  Consulted and Agree with Results and Recommendations Patient;MD;RN  Progression Toward Goals  Progression toward goals Goals met, education completed, patient discharged from SLP  SLP Time Calculation  SLP Start Time (ACUTE ONLY) 1048  SLP Stop Time (ACUTE ONLY) 1110  SLP Time Calculation (min) (ACUTE ONLY) 22 min  SLP G-Codes **NOT FOR INPATIENT CLASS**  Functional Assessment Tool Used clinican judgement  Functional Limitations Swallowing  Swallow Current Status (H4193) [facility]  Swallow Goal Status [phone]) [facility]  Swallow Discharge Status (X4128) [place]  SLP Evaluations  $  SLP Speech Visit 1 Procedure  SLP Evaluations  $BSS Swallow 1 Procedure

## 2016-02-14 ENCOUNTER — Telehealth: Payer: Self-pay | Admitting: *Deleted

## 2016-02-14 NOTE — Telephone Encounter (Signed)
I called patient for 57-month phone call for follow-up in REDS@Discharge  Study.I spoke with Tonia Ghent her nurse and she stated patient doing well. Patient has been hospitalized in last 3 months for anemia from 02/02/16 -02/04/16.Marland Kitchen

## 2016-02-28 ENCOUNTER — Emergency Department (HOSPITAL_COMMUNITY)
Admission: EM | Admit: 2016-02-28 | Discharge: 2016-02-28 | Disposition: A | Discharge status: Home / Self Care | Payer: Medicare HMO | Attending: Emergency Medicine | Admitting: Emergency Medicine

## 2016-02-28 ENCOUNTER — Emergency Department (HOSPITAL_COMMUNITY): Payer: Medicare HMO

## 2016-02-28 ENCOUNTER — Encounter (HOSPITAL_COMMUNITY): Payer: Self-pay

## 2016-02-28 DIAGNOSIS — R55 Syncope and collapse: Secondary | ICD-10-CM | POA: Insufficient documentation

## 2016-02-28 DIAGNOSIS — M1990 Unspecified osteoarthritis, unspecified site: Secondary | ICD-10-CM | POA: Insufficient documentation

## 2016-02-28 DIAGNOSIS — Z7982 Long term (current) use of aspirin: Secondary | ICD-10-CM | POA: Insufficient documentation

## 2016-02-28 DIAGNOSIS — Z79899 Other long term (current) drug therapy: Secondary | ICD-10-CM | POA: Insufficient documentation

## 2016-02-28 DIAGNOSIS — R011 Cardiac murmur, unspecified: Secondary | ICD-10-CM | POA: Insufficient documentation

## 2016-02-28 DIAGNOSIS — Z87448 Personal history of other diseases of urinary system: Secondary | ICD-10-CM | POA: Insufficient documentation

## 2016-02-28 DIAGNOSIS — E785 Hyperlipidemia, unspecified: Secondary | ICD-10-CM | POA: Diagnosis not present

## 2016-02-28 DIAGNOSIS — R0689 Other abnormalities of breathing: Secondary | ICD-10-CM | POA: Diagnosis not present

## 2016-02-28 DIAGNOSIS — I10 Essential (primary) hypertension: Secondary | ICD-10-CM | POA: Insufficient documentation

## 2016-02-28 DIAGNOSIS — J449 Chronic obstructive pulmonary disease, unspecified: Secondary | ICD-10-CM | POA: Insufficient documentation

## 2016-02-28 DIAGNOSIS — Z950 Presence of cardiac pacemaker: Secondary | ICD-10-CM | POA: Diagnosis not present

## 2016-02-28 DIAGNOSIS — E119 Type 2 diabetes mellitus without complications: Secondary | ICD-10-CM | POA: Diagnosis not present

## 2016-02-28 DIAGNOSIS — E039 Hypothyroidism, unspecified: Secondary | ICD-10-CM | POA: Diagnosis not present

## 2016-02-28 DIAGNOSIS — Z8701 Personal history of pneumonia (recurrent): Secondary | ICD-10-CM | POA: Insufficient documentation

## 2016-02-28 DIAGNOSIS — Z86711 Personal history of pulmonary embolism: Secondary | ICD-10-CM | POA: Insufficient documentation

## 2016-02-28 DIAGNOSIS — Z85038 Personal history of other malignant neoplasm of large intestine: Secondary | ICD-10-CM | POA: Diagnosis not present

## 2016-02-28 LAB — COMPREHENSIVE METABOLIC PANEL
ALK PHOS: 57 U/L (ref 38–126)
ALT: 28 U/L (ref 14–54)
ANION GAP: 9 (ref 5–15)
AST: 34 U/L (ref 15–41)
Albumin: 3.4 g/dL — ABNORMAL LOW (ref 3.5–5.0)
BUN: 32 mg/dL — ABNORMAL HIGH (ref 6–20)
CALCIUM: 9.7 mg/dL (ref 8.9–10.3)
CO2: 23 mmol/L (ref 22–32)
CREATININE: 1.3 mg/dL — AB (ref 0.44–1.00)
Chloride: 106 mmol/L (ref 101–111)
GFR, EST AFRICAN AMERICAN: 44 mL/min — AB (ref >60)
GFR, EST NON AFRICAN AMERICAN: 38 mL/min — AB (ref >60)
Glucose, Bld: 81 mg/dL (ref 65–99)
Potassium: 4.3 mmol/L (ref 3.5–5.1)
SODIUM: 138 mmol/L (ref 135–145)
TOTAL PROTEIN: 7.2 g/dL (ref 6.5–8.1)
Total Bilirubin: 0.2 mg/dL — ABNORMAL LOW (ref 0.3–1.2)

## 2016-02-28 LAB — CBC WITH DIFFERENTIAL/PLATELET
Basophils Absolute: 0 10*3/uL (ref 0.0–0.1)
Basophils Relative: 0 %
EOS ABS: 0.1 10*3/uL (ref 0.0–0.7)
EOS PCT: 1 %
HCT: 25.5 % — ABNORMAL LOW (ref 36.0–46.0)
HEMOGLOBIN: 7.8 g/dL — AB (ref 12.0–15.0)
LYMPHS ABS: 1.6 10*3/uL (ref 0.7–4.0)
LYMPHS PCT: 26 %
MCH: 26.7 pg (ref 26.0–34.0)
MCHC: 30.6 g/dL (ref 30.0–36.0)
MCV: 87.3 fL (ref 78.0–100.0)
MONOS PCT: 10 %
Monocytes Absolute: 0.6 10*3/uL (ref 0.1–1.0)
Neutro Abs: 3.8 10*3/uL (ref 1.7–7.7)
Neutrophils Relative %: 63 %
PLATELETS: 277 10*3/uL (ref 150–400)
RBC: 2.92 MIL/uL — ABNORMAL LOW (ref 3.87–5.11)
RDW: 16.2 % — ABNORMAL HIGH (ref 11.5–15.5)
WBC: 6 10*3/uL (ref 4.0–10.5)

## 2016-02-28 LAB — URINALYSIS, ROUTINE W REFLEX MICROSCOPIC
BILIRUBIN URINE: NEGATIVE
Glucose, UA: NEGATIVE mg/dL
HGB URINE DIPSTICK: NEGATIVE
KETONES UR: NEGATIVE mg/dL
Leukocytes, UA: NEGATIVE
NITRITE: NEGATIVE
PROTEIN: NEGATIVE mg/dL
SPECIFIC GRAVITY, URINE: 1.015 (ref 1.005–1.030)
pH: 5.5 (ref 5.0–8.0)

## 2016-02-28 LAB — TROPONIN I

## 2016-02-28 LAB — BRAIN NATRIURETIC PEPTIDE: B NATRIURETIC PEPTIDE 5: 146 pg/mL — AB (ref 0.0–100.0)

## 2016-02-28 LAB — CBG MONITORING, ED: Glucose-Capillary: 70 mg/dL (ref 65–99)

## 2016-02-28 MED ORDER — POLYETHYL GLYCOL-PROPYL GLYCOL 0.4-0.3 % OP SOLN
1.0000 [drp] | Freq: Two times a day (BID) | OPHTHALMIC | Status: DC
Start: 2016-02-28 — End: 2017-02-17

## 2016-02-28 MED ORDER — SODIUM CHLORIDE 0.9 % IV SOLN
INTRAVENOUS | Status: DC
  Administered 2016-02-28: 11:00:00 via INTRAVENOUS

## 2016-02-28 NOTE — ED Notes (Signed)
Pt. BIB [facility] for evaluation of full syncopal episode today. Pt. Lives at [facility] in [place]. Pt. States she felt fine this AM when she got up, she ate normal breakfast and went to piano room. Pt. States she was sitting and reading when event happened. States she had sudden onset of dizziness and diaphoresis. Witnessed event by staff. Pt. Does have demand pacer and LBBB. Pt. AxO x4.

## 2016-02-28 NOTE — ED Provider Notes (Signed)
CSN: [id number]     Arrival date & time 02/28/16  1018 History   First MD Initiated Contact with Patient 02/28/16 1021     No chief complaint on file.    (Consider location/radiation/quality/duration/timing/severity/associated sxs/prior Treatment) HPI Patient presents after an episode of prodromal syncope. Patient recalls going to read her Bible. She felt lightheaded, lost consciousness. No fall, no trauma. Per report the patient was unconscious for about 4 minutes. I discussed the patient's presentation with paramedics on arrival. Currently the patient denies complaint, states that she feels normal again. She does describe recent decreased urinary output, no other recent health changes, medication changes, diet changes.  Past Medical History  Diagnosis Date  . Hypertension   . Asthma   . CHF (congestive heart failure) ([place])   . COPD (chronic obstructive pulmonary disease) ([place])   . Colon cancer ([place])   . Hyperlipidemia   . Heart murmur   . Anginal pain ([place])   . Pulmonary embolism ([place])   . Pneumonia     "just once" (03/01/2015)  . Hypothyroidism   . History of blood transfusion     "related to anemia, this is my 1st" (03/01/2015)  . Arthritis     "comes and goes" (03/01/2015)  . Renal disorder   . Colostomy care ([place])   . Diabetes mellitus without complication ([place])     pt denies this hx on 03/01/2015  . Presence of combination internal cardiac defibrillator (ICD) and pacemaker     2005 in [facility]   Past Surgical History  Procedure Laterality Date  . Cholecystectomy    . Colon surgery      cancer  . Fracture surgery    . Dilation and curettage of uterus    . Tubal ligation    . Cataract extraction w/ intraocular lens  implant, bilateral Bilateral   . Ankle fracture surgery Left   . Abdominal hysterectomy    . Cardiac catheterization N/A 03/22/2015    Procedure: Right/Left Heart Cath and Coronary Angiography;  Surgeon: Leonie Man, MD;  Location: [facility];  Service: Cardiovascular;  Laterality: N/A;   Family History  Problem Relation Age of Onset  . CAD    . Hypertension    . Stroke    . Colon cancer Neg Hx   . GI Bleed Neg Hx    Social History  Substance Use Topics  . Smoking status: Never Smoker   . Smokeless tobacco: Never Used  . Alcohol Use: No   OB History    No data available     Review of Systems  Constitutional:       Per HPI, otherwise negative  HENT:       Per HPI, otherwise negative  Respiratory:       Per HPI, otherwise negative  Cardiovascular:       Per HPI, otherwise negative  Gastrointestinal: Negative for vomiting and blood in stool.  Endocrine:       Negative aside from HPI  Genitourinary:       Neg aside from HPI   Musculoskeletal:       Per HPI, otherwise negative  Skin: Negative.   Neurological: Positive for syncope.      Allergies  Review of patient's allergies indicates no known allergies.  Home Medications   Prior to Admission medications   Medication Sig Start Date End Date Taking? Authorizing Provider  acetaminophen (TYLENOL) 325 MG tablet Take 650 mg by mouth every 6 (six) hours  as needed (pain).    Historical Provider, MD  ADVAIR DISKUS 500-50 MCG/DOSE AEPB Inhale 1 puff into the lungs every 12 (twelve) hours. 12/28/14   Historical Provider, MD  albuterol (PROVENTIL) (2.5 MG/3ML) 0.083% nebulizer solution Take 3 mLs (2.5 mg total) by nebulization every 4 (four) hours as needed for wheezing. 04/28/15   Geradine Girt, DO  aspirin EC 81 MG tablet Take 1 tablet (81 mg total) by mouth every morning. 03/03/15   Ripudeep Krystal Eaton, MD  atorvastatin (LIPITOR) 20 MG tablet Take 20 mg by mouth daily.     Historical Provider, MD  carvedilol (COREG) 3.125 MG tablet Take 1 tablet (3.125 mg total) by mouth 2 (two) times daily with a meal. 02/04/16   Bonnell Public, MD  feeding supplement, ENSURE ENLIVE, (ENSURE ENLIVE) LIQD Take 237 mLs by mouth 2 (two) times daily between meals. Patient taking  differently: Take 237 mLs by mouth 2 (two) times daily.  03/02/15   Ripudeep Krystal Eaton, MD  ferrous sulfate 325 (65 FE) MG tablet Take 325 mg by mouth every morning.    Historical Provider, MD  furosemide (LASIX) 40 MG tablet Take 1 tablet (40 mg total) by mouth daily. 12/30/15   Shirley Friar, PA-C  hydrALAZINE (APRESOLINE) 25 MG tablet Take 0.5 tablets (12.5 mg total) by mouth 3 (three) times daily. 12/23/15   Amy D Ninfa Meeker, NP  HYDROcodone-acetaminophen (NORCO/VICODIN) 5-325 MG tablet Take 1 tablet by mouth every 4 (four) hours as needed for moderate pain.    Historical Provider, MD  isosorbide mononitrate (IMDUR) 30 MG 24 hr tablet Take 1 tablet (30 mg total) by mouth daily. 03/24/15   Nishant Dhungel, MD  lactobacilus acidophilus & bulgar (FLORANEX) TABS chewable tablet Take 1 tablet by mouth 3 (three) times daily with meals. 07/29/15   Florencia Reasons, MD  levothyroxine (SYNTHROID, LEVOTHROID) 50 MCG tablet Take 50 mcg by mouth every morning.  01/16/15   Historical Provider, MD  lisinopril (PRINIVIL,ZESTRIL) 20 MG tablet Take 20 mg by mouth daily.    Historical Provider, MD  oxymetazoline (AFRIN) 0.05 % nasal spray Place 1 spray into both nostrils 2 (two) times daily as needed (for nose bleeds). Reported on 12/23/2015    Historical Provider, MD  Polyethyl Glycol-Propyl Glycol (SYSTANE OP) Place 1 drop into both eyes 2 (two) times daily.     Historical Provider, MD  potassium chloride (K-DUR) 10 MEQ tablet Take 1 tablet (10 mEq total) by mouth daily. 10/28/15   Noemi Chapel, MD  ranitidine (ZANTAC) 150 MG tablet Take 150 mg by mouth daily.  01/14/15   Historical Provider, MD  sertraline (ZOLOFT) 25 MG tablet Take 25 mg by mouth daily.    Historical Provider, MD   There were no vitals taken for this visit. Physical Exam  Constitutional: She is oriented to person, place, and time. She appears well-developed and well-nourished. No distress.  HENT:  Head: Normocephalic and atraumatic.  Eyes: Conjunctivae and  EOM are normal.  Cardiovascular: Normal rate and regular rhythm.   Pulmonary/Chest: Effort normal. No stridor. No respiratory distress.  Decreased breath sounds  Abdominal: She exhibits no distension.  Musculoskeletal: She exhibits edema.  Trace bilateral lower extremity edema, nonpitting  Neurological: She is alert and oriented to person, place, and time. No cranial nerve deficit.  Skin: Skin is warm and dry.  Psychiatric: She has a normal mood and affect.  Nursing note and vitals reviewed.   ED Course  Procedures (including critical care time) Labs  Review Labs Reviewed  CBC WITH DIFFERENTIAL/PLATELET - Abnormal; Notable for the following:    RBC 2.92 (*)    Hemoglobin 7.8 (*)    HCT 25.5 (*)    RDW 16.2 (*)    All other components within normal limits  COMPREHENSIVE METABOLIC PANEL - Abnormal; Notable for the following:    BUN 32 (*)    Creatinine, Ser 1.30 (*)    Albumin 3.4 (*)    Total Bilirubin 0.2 (*)    GFR calc non Af Amer 38 (*)    GFR calc Af Amer 44 (*)    All other components within normal limits  BRAIN NATRIURETIC PEPTIDE - Abnormal; Notable for the following:    B Natriuretic Peptide 146.0 (*)    All other components within normal limits  TROPONIN I  URINALYSIS, ROUTINE W REFLEX MICROSCOPIC (NOT AT [facility])  POCT CBG (FASTING - GLUCOSE)-MANUAL ENTRY  CBG MONITORING, ED    Imaging Review Dg Chest 2 View  02/28/2016  CLINICAL DATA:  Syncope EXAM: CHEST  2 VIEW COMPARISON:  02/02/2016 FINDINGS: Cardiac enlargement with mild vascular congestion. Negative for edema or effusion. Elevated right hemidiaphragm.  Bibasilar atelectasis unchanged. AICD and pacemaker unchanged in position IMPRESSION: Cardiac enlargement with mild vascular congestion. Negative for pulmonary edema Mild bibasilar atelectasis unchanged. Electronically Signed   By: Franchot Gallo M.D.   On: 02/28/2016 10:58   Ct Head Wo Contrast  02/28/2016  CLINICAL DATA:  Syncopal episode earlier today EXAM:  CT HEAD WITHOUT CONTRAST TECHNIQUE: Contiguous axial images were obtained from the base of the skull through the vertex without intravenous contrast. COMPARISON:  02/02/2016 FINDINGS: The bony calvarium is intact. No gross soft tissue abnormality is noted. No findings to suggest acute hemorrhage, acute infarction or space-occupying mass lesion are noted. Mild atrophic changes are seen. IMPRESSION: Mild atrophy without acute abnormality. Electronically Signed   By: Inez Catalina M.D.   On: 02/28/2016 15:36   I have personally reviewed and evaluated these images and lab results as part of my medical decision-making.   EKG Interpretation   Date/Time:  Monday Feb 28 2016 10:32:54 EDT Ventricular Rate:  80 PR Interval:  225 QRS Duration: 169 QT Interval:  474 QTC Calculation: 547 R Axis:   99 Text Interpretation:  Atrial-sensed ventricular-paced rhythm No further  analysis attempted due to paced rhythm Abnormal ekg Confirmed by Carmin Muskrat  MD ([id number]) on 02/28/2016 11:48:05 AM       3:41 PM Vital signs 140/60, heart rate normal on repeat exam the patient is in no distress, has no ongoing complaints. We discussed all findings at length. Patient has had no bleeding anywhere, no other recent health changes.  EMR notable for prior syncope events.  We interrogated the pacemaker, no notable events. MDM  Patient with history of prior syncopal events presents after a similar event.  His patient is awake and alert, with no ongoing complaints per Patient's blood pressure is unremarkable, and though she has slight decrease in hemoglobin she denies active bleeding, and with no tachycardia, hypotension, there is low suspicion for exsanguination. No evidence for ongoing infection, and with reassuring results on interrogation of the patient's pacemaker, there is low suspicion for cardiogenic etiology. Patient's recent episodes of syncope was similar, though with some evidence for dehydration. Here  with reassuring findings, no arrhythmia on monitor, no change over hours of monitoring, patient was discharged back to her monitored facility. She does have secondary concern is dry eye, but her prescription  for with rehydrating drops was refilled.   Carmin Muskrat, MD 02/28/16 [phone]

## 2016-02-28 NOTE — Discharge Instructions (Signed)
As discussed, your evaluation today has been largely reassuring.  But, it is important that you monitor your condition carefully, and do not hesitate to return to the ED if you develop new, or concerning changes in your condition. ° °Otherwise, please follow-up with your physician for appropriate ongoing care. ° °Syncope °Syncope is a medical term for fainting or passing out. This means you lose consciousness and drop to the ground. People are generally unconscious for less than 5 minutes. You may have some muscle twitches for up to 15 seconds before waking up and returning to normal. Syncope occurs more often in older adults, but it can happen to anyone. While most causes of syncope are not dangerous, syncope can be a sign of a serious medical problem. It is important to seek medical care.  °CAUSES  °Syncope is caused by a sudden drop in blood flow to the brain. The specific cause is often not determined. Factors that can bring on syncope include: °· Taking medicines that lower blood pressure. °· Sudden changes in posture, such as standing up quickly. °· Taking more medicine than prescribed. °· Standing in one place for too long. °· Seizure disorders. °· Dehydration and excessive exposure to heat. °· Low blood sugar (hypoglycemia). °· Straining to have a bowel movement. °· Heart disease, irregular heartbeat, or other circulatory problems. °· Fear, emotional distress, seeing blood, or severe pain. °SYMPTOMS  °Right before fainting, you may: °· Feel dizzy or light-headed. °· Feel nauseous. °· See all white or all black in your field of vision. °· Have cold, clammy skin. °DIAGNOSIS  °Your health care provider will ask about your symptoms, perform a physical exam, and perform an electrocardiogram (ECG) to record the electrical activity of your heart. Your health care provider may also perform other heart or blood tests to determine the cause of your syncope which may include: °· Transthoracic echocardiogram (TTE).  During echocardiography, sound waves are used to evaluate how blood flows through your heart. °· Transesophageal echocardiogram (TEE). °· Cardiac monitoring. This allows your health care provider to monitor your heart rate and rhythm in real time. °· Holter monitor. This is a portable device that records your heartbeat and can help diagnose heart arrhythmias. It allows your health care provider to track your heart activity for several days, if needed. °· Stress tests by exercise or by giving medicine that makes the heart beat faster. °TREATMENT  °In most cases, no treatment is needed. Depending on the cause of your syncope, your health care provider may recommend changing or stopping some of your medicines. °HOME CARE INSTRUCTIONS °· Have someone stay with you until you feel stable. °· Do not drive, use machinery, or play sports until your health care provider says it is okay. °· Keep all follow-up appointments as directed by your health care provider. °· Lie down right away if you start feeling like you might faint. Breathe deeply and steadily. Wait until all the symptoms have passed. °· Drink enough fluids to keep your urine clear or pale yellow. °· If you are taking blood pressure or heart medicine, get up slowly and take several minutes to sit and then stand. This can reduce dizziness. °SEEK IMMEDIATE MEDICAL CARE IF:  °· You have a severe headache. °· You have unusual pain in the chest, abdomen, or back. °· You are bleeding from your mouth or rectum, or you have black or tarry stool. °· You have an irregular or very fast heartbeat. °· You have pain with breathing. °·   You have repeated fainting or seizure-like jerking during an episode. °· You faint when sitting or lying down. °· You have confusion. °· You have trouble walking. °· You have severe weakness. °· You have vision problems. °If you fainted, call your local emergency services (911 in [place] Do not drive yourself to the hospital.  °  °This information  is not intended to replace advice given to you by your health care provider. Make sure you discuss any questions you have with your health care provider. °  °Document Released: 09/25/2005 Document Revised: 02/09/2015 Document Reviewed: 11/24/2011 °Elsevier Interactive Patient Education ©2016 [employer] ° °

## 2016-03-23 ENCOUNTER — Telehealth (HOSPITAL_COMMUNITY): Payer: Self-pay | Admitting: *Deleted

## 2016-03-23 NOTE — Telephone Encounter (Signed)
Katie called to let us know that pt has been weighed since hosp d/c because orders for daily weights were not on her d/c orders they received.    [place] and faxed order to them for daily weights to [phone]

## 2016-05-22 ENCOUNTER — Telehealth (HOSPITAL_COMMUNITY): Payer: Self-pay | Admitting: Surgery

## 2016-05-22 NOTE — Telephone Encounter (Signed)
Patient is receiving ongoing supervision at [facility] for her HF.  They insure that she weighs each day and have instructions for increased diuretic dose when weight is increased.  She will be discharged at this time from [facility].

## 2017-01-15 ENCOUNTER — Encounter (HOSPITAL_COMMUNITY): Payer: Self-pay | Admitting: *Deleted

## 2017-01-15 ENCOUNTER — Emergency Department (HOSPITAL_COMMUNITY): Payer: Medicare HMO

## 2017-01-15 ENCOUNTER — Emergency Department (HOSPITAL_COMMUNITY)
Admission: EM | Admit: 2017-01-15 | Discharge: 2017-01-15 | Disposition: A | Discharge status: Home / Self Care | Payer: Medicare HMO | Attending: Emergency Medicine | Admitting: Emergency Medicine

## 2017-01-15 DIAGNOSIS — J40 Bronchitis, not specified as acute or chronic: Secondary | ICD-10-CM | POA: Insufficient documentation

## 2017-01-15 DIAGNOSIS — R05 Cough: Secondary | ICD-10-CM

## 2017-01-15 DIAGNOSIS — I5022 Chronic systolic (congestive) heart failure: Secondary | ICD-10-CM | POA: Diagnosis not present

## 2017-01-15 DIAGNOSIS — R059 Cough, unspecified: Secondary | ICD-10-CM

## 2017-01-15 DIAGNOSIS — Z79899 Other long term (current) drug therapy: Secondary | ICD-10-CM | POA: Diagnosis not present

## 2017-01-15 DIAGNOSIS — I130 Hypertensive heart and chronic kidney disease with heart failure and stage 1 through stage 4 chronic kidney disease, or unspecified chronic kidney disease: Secondary | ICD-10-CM | POA: Diagnosis not present

## 2017-01-15 DIAGNOSIS — Z7982 Long term (current) use of aspirin: Secondary | ICD-10-CM | POA: Diagnosis not present

## 2017-01-15 DIAGNOSIS — N183 Chronic kidney disease, stage 3 (moderate): Secondary | ICD-10-CM | POA: Diagnosis not present

## 2017-01-15 DIAGNOSIS — J449 Chronic obstructive pulmonary disease, unspecified: Secondary | ICD-10-CM | POA: Diagnosis not present

## 2017-01-15 DIAGNOSIS — Z85038 Personal history of other malignant neoplasm of large intestine: Secondary | ICD-10-CM | POA: Diagnosis not present

## 2017-01-15 DIAGNOSIS — E039 Hypothyroidism, unspecified: Secondary | ICD-10-CM | POA: Diagnosis not present

## 2017-01-15 LAB — CBC WITH DIFFERENTIAL/PLATELET
Basophils Absolute: 0 10*3/uL (ref 0.0–0.1)
Basophils Relative: 0 %
EOS ABS: 0.1 10*3/uL (ref 0.0–0.7)
EOS PCT: 2 %
HCT: 28 % — ABNORMAL LOW (ref 36.0–46.0)
Hemoglobin: 8.3 g/dL — ABNORMAL LOW (ref 12.0–15.0)
Lymphocytes Relative: 31 %
Lymphs Abs: 2.2 10*3/uL (ref 0.7–4.0)
MCH: 25.1 pg — AB (ref 26.0–34.0)
MCHC: 29.6 g/dL — ABNORMAL LOW (ref 30.0–36.0)
MCV: 84.6 fL (ref 78.0–100.0)
MONO ABS: 0.7 10*3/uL (ref 0.1–1.0)
Monocytes Relative: 10 %
Neutro Abs: 4.1 10*3/uL (ref 1.7–7.7)
Neutrophils Relative %: 57 %
PLATELETS: 369 10*3/uL (ref 150–400)
RBC: 3.31 MIL/uL — AB (ref 3.87–5.11)
RDW: 16.3 % — AB (ref 11.5–15.5)
WBC: 7.2 10*3/uL (ref 4.0–10.5)

## 2017-01-15 LAB — BASIC METABOLIC PANEL
ANION GAP: 10 (ref 5–15)
BUN: 16 mg/dL (ref 6–20)
CO2: 25 mmol/L (ref 22–32)
Calcium: 9.4 mg/dL (ref 8.9–10.3)
Chloride: 104 mmol/L (ref 101–111)
Creatinine, Ser: 1.17 mg/dL — ABNORMAL HIGH (ref 0.44–1.00)
GFR calc Af Amer: 50 mL/min — ABNORMAL LOW (ref >60)
GFR, EST NON AFRICAN AMERICAN: 43 mL/min — AB (ref >60)
GLUCOSE: 90 mg/dL (ref 65–99)
POTASSIUM: 4.1 mmol/L (ref 3.5–5.1)
Sodium: 139 mmol/L (ref 135–145)

## 2017-01-15 MED ORDER — IPRATROPIUM-ALBUTEROL 0.5-2.5 (3) MG/3ML IN SOLN
3.0000 mL | Freq: Once | RESPIRATORY_TRACT | Status: AC
  Administered 2017-01-15: 3 mL via RESPIRATORY_TRACT
  Filled 2017-01-15: qty 3

## 2017-01-15 MED ORDER — PREDNISONE 20 MG PO TABS: 40.0000 mg | ORAL_TABLET | Freq: Every day | ORAL | 0 refills | Status: DC

## 2017-01-15 MED ORDER — BENZONATATE 100 MG PO CAPS
100.0000 mg | ORAL_CAPSULE | Freq: Once | ORAL | Status: AC
  Administered 2017-01-15: 100 mg via ORAL
  Filled 2017-01-15: qty 1

## 2017-01-15 MED ORDER — BENZONATATE 100 MG PO CAPS: 100.0000 mg | ORAL_CAPSULE | Freq: Three times a day (TID) | ORAL | 0 refills | Status: DC | PRN

## 2017-01-15 MED ORDER — DOXYCYCLINE HYCLATE 100 MG PO CAPS: 100.0000 mg | ORAL_CAPSULE | Freq: Two times a day (BID) | ORAL | 0 refills | Status: DC

## 2017-01-15 NOTE — Discharge Instructions (Signed)
You were seen today for cough. Your workup is reassuring. There is no evidence of pneumonia.  You will be given Tessalon Perles. Make sure to take your inhaler as directed. If you develop fevers or shortness of breath, or any new or worsening symptoms you need to be reevaluated.

## 2017-01-15 NOTE — ED Triage Notes (Signed)
The pt arrived by [facility] w/c from [facility]  c/o a productive cough for one week unable to sleep  Tonight.  Chest congestion  Thick greeen sputum  No resp distress

## 2017-01-15 NOTE — ED Notes (Signed)
Pt departed in NAD.  

## 2017-01-15 NOTE — ED Triage Notes (Signed)
TC to [place] and spoke with Tisha to update info about transprotation of

## 2017-01-15 NOTE — ED Provider Notes (Signed)
[facility] DEPT Provider Note   CSN: [id number] Arrival date & time: 01/15/17  0348     History   Chief Complaint Chief Complaint  Patient presents with  . Cough    HPI [name] is a 83 y.o. female.  HPI  This is a 83 year old female with a history of CHF, asthma, COPD, diabetes who presents with two-week history of cough. Patient states the it has gotten worse. She reports that is productive of yellow sputum. It is worse at night. Denies fevers. Denies chest pain or shortness of breath. Denies worsening lower extremity edema. Patient states that she was unable to sleep last night. She does use an inhaler daily. No history of smoking. She cannot remember her last asthma exacerbation.  Past Medical History:  Diagnosis Date  . Anginal pain ([place])   . Arthritis    "comes and goes" (03/01/2015)  . Asthma   . CHF (congestive heart failure) ([place])   . Colon cancer ([place])   . Colostomy care ([place])   . COPD (chronic obstructive pulmonary disease) ([place])   . Diabetes mellitus without complication ([place])    pt denies this hx on 03/01/2015  . Heart murmur   . History of blood transfusion    "related to anemia, this is my 1st" (03/01/2015)  . Hyperlipidemia   . Hypertension   . Hypothyroidism   . Pneumonia    "just once" (03/01/2015)  . Presence of combination internal cardiac defibrillator (ICD) and pacemaker    2005 in [facility] MDT  . Pulmonary embolism ([place])   . Renal disorder     Patient Active Problem List   Diagnosis Date Noted  . Syncope 02/02/2016  . Faintness   . Essential hypertension 11/16/2015  . Hypothyroidism 11/16/2015  . Acute on chronic systolic (congestive) heart failure 11/15/2015  . Hypertensive heart disease 10/04/2015  . Elevated troponin 10/04/2015  . Acute respiratory failure ([place])   . Acute systolic congestive heart failure ([place])   . NICM (nonischemic cardiomyopathy) ([place]) 08/11/2015  . Normal coronary arteries 08/11/2015  . Dyslipidemia 07/27/2015    . CKD (chronic kidney disease) stage 3, GFR 30-59 ml/min 07/27/2015  . Anemia of chronic disease 07/27/2015  . Benign essential HTN 07/27/2015  . Hypothyroidism, adult 07/27/2015  . FUO (fever of unknown origin) 07/27/2015  . IDA (iron deficiency anemia) 07/27/2015  . Chronic systolic CHF (congestive heart failure) ([place]) 07/27/2015  . Colon cancer ([place]) 07/27/2015  . Sepsis ([place]) 07/24/2015  . Acute encephalopathy 07/24/2015  . Hypokalemia 07/24/2015  . Dementia 07/23/2015  . COPD (chronic obstructive pulmonary disease) ([place]) 04/25/2015  . ICD in place 04/14/2015  . Colostomy in place [facility]) 03/01/2015    Past Surgical History:  Procedure Laterality Date  . ABDOMINAL HYSTERECTOMY    . ANKLE FRACTURE SURGERY Left   . CARDIAC CATHETERIZATION N/A 03/22/2015   Procedure: Right/Left Heart Cath and Coronary Angiography;  Surgeon: Leonie Man, MD;  Location: [facility];  Service: Cardiovascular;  Laterality: N/A;  . CATARACT EXTRACTION W/ INTRAOCULAR LENS  IMPLANT, BILATERAL Bilateral   . CHOLECYSTECTOMY    . COLON SURGERY     cancer  . DILATION AND CURETTAGE OF UTERUS    . FRACTURE SURGERY    . TUBAL LIGATION      OB History    No data available       Home Medications    Prior to Admission medications   Medication Sig Start Date End Date Taking? Authorizing Provider  acetaminophen (TYLENOL) 325 MG tablet Take 650 mg by mouth every 6 (six) hours as needed (pain).    Historical Provider, MD  ADVAIR DISKUS 500-50 MCG/DOSE AEPB Inhale 1 puff into the lungs every 12 (twelve) hours. 12/28/14   Historical Provider, MD  albuterol (PROVENTIL) (2.5 MG/3ML) 0.083% nebulizer solution Take 3 mLs (2.5 mg total) by nebulization every 4 (four) hours as needed for wheezing. 04/28/15   Geradine Girt, DO  aspirin EC 81 MG tablet Take 1 tablet (81 mg total) by mouth every morning. 03/03/15   Ripudeep Krystal Eaton, MD  atorvastatin (LIPITOR) 20 MG tablet Take 20 mg by mouth daily.      Historical Provider, MD  benzonatate (TESSALON) 100 MG capsule Take 1 capsule (100 mg total) by mouth 3 (three) times daily as needed for cough. 01/15/17   [name], MD  carvedilol (COREG) 3.125 MG tablet Take 1 tablet (3.125 mg total) by mouth 2 (two) times daily with a meal. 02/04/16   Bonnell Public, MD  feeding supplement, ENSURE ENLIVE, (ENSURE ENLIVE) LIQD Take 237 mLs by mouth 2 (two) times daily between meals. Patient taking differently: Take 237 mLs by mouth 2 (two) times daily.  03/02/15   Ripudeep Krystal Eaton, MD  ferrous sulfate 325 (65 FE) MG tablet Take 325 mg by mouth every morning.    Historical Provider, MD  furosemide (LASIX) 40 MG tablet Take 1 tablet (40 mg total) by mouth daily. 12/30/15   Shirley Friar, PA-C  hydrALAZINE (APRESOLINE) 25 MG tablet Take 0.5 tablets (12.5 mg total) by mouth 3 (three) times daily. 12/23/15   Amy D Ninfa Meeker, NP  HYDROcodone-acetaminophen (NORCO/VICODIN) 5-325 MG tablet Take 1 tablet by mouth every 4 (four) hours as needed for moderate pain.    Historical Provider, MD  iron polysaccharides (NIFEREX) 150 MG capsule Take 150 mg by mouth daily.    Historical Provider, MD  isosorbide mononitrate (IMDUR) 30 MG 24 hr tablet Take 1 tablet (30 mg total) by mouth daily. 03/24/15   Nishant Dhungel, MD  lactobacillus (FLORANEX/LACTINEX) PACK Take 1 g by mouth 4 (four) times daily -  with meals and at bedtime.    Historical Provider, MD  lactobacilus acidophilus & bulgar (FLORANEX) TABS chewable tablet Take 1 tablet by mouth 3 (three) times daily with meals. 07/29/15   Florencia Reasons, MD  levothyroxine (SYNTHROID, LEVOTHROID) 50 MCG tablet Take 50 mcg by mouth every morning.  01/16/15   Historical Provider, MD  lisinopril (PRINIVIL,ZESTRIL) 20 MG tablet Take 20 mg by mouth daily.    Historical Provider, MD  oxymetazoline (AFRIN) 0.05 % nasal spray Place 1 spray into both nostrils 2 (two) times daily as needed (for nose bleeds). Reported on 12/23/2015    Historical  Provider, MD  Polyethyl Glycol-Propyl Glycol (SYSTANE) 0.4-0.3 % SOLN Place 1 drop into both eyes 2 (two) times daily. 02/28/16   Carmin Muskrat, MD  potassium chloride (K-DUR) 10 MEQ tablet Take 1 tablet (10 mEq total) by mouth daily. 10/28/15   Noemi Chapel, MD  ranitidine (ZANTAC) 150 MG tablet Take 150 mg by mouth daily.  01/14/15   Historical Provider, MD  sertraline (ZOLOFT) 25 MG tablet Take 25 mg by mouth daily.    Historical Provider, MD    Family History Family History  Problem Relation Age of Onset  . CAD    . Hypertension    . Stroke    . Colon cancer Neg Hx   . GI Bleed Neg Hx  Social History Social History  Substance Use Topics  . Smoking status: Never Smoker  . Smokeless tobacco: Never Used  . Alcohol use No     Allergies   Patient has no known allergies.   Review of Systems Review of Systems  Constitutional: Negative for fever.  Respiratory: Positive for cough. Negative for shortness of breath.   Cardiovascular: Negative for chest pain and leg swelling.  Gastrointestinal: Negative for abdominal pain, nausea and vomiting.  All other systems reviewed and are negative.    Physical Exam Updated Vital Signs BP (!) 155/54   Pulse 85   Temp 98.7 F (37.1 C) (Oral)   Resp (!) 22   Ht 5\' 6"  (1.676 m)   Wt 198 lb (89.8 kg)   SpO2 100%   BMI 31.96 kg/m   Physical Exam  Constitutional: She is oriented to person, place, and time. No distress.  Elderly  HENT:  Head: Normocephalic and atraumatic.  Cardiovascular: Normal rate, regular rhythm and normal heart sounds.   Pulmonary/Chest: Effort normal. No respiratory distress. She has wheezes.  Diminished breath sounds in all lung fields, scant wheeze  Abdominal: Soft. Bowel sounds are normal. There is no tenderness. There is no guarding.  Musculoskeletal:  1+ bilateral lower extremity edema  Neurological: She is alert and oriented to person, place, and time.  Skin: Skin is warm and dry.  Psychiatric:  She has a normal mood and affect.  Nursing note and vitals reviewed.    ED Treatments / Results  Labs (all labs ordered are listed, but only abnormal results are displayed) Labs Reviewed  CBC WITH DIFFERENTIAL/PLATELET - Abnormal; Notable for the following:       Result Value   RBC 3.31 (*)    Hemoglobin 8.3 (*)    HCT 28.0 (*)    MCH 25.1 (*)    MCHC 29.6 (*)    RDW 16.3 (*)    All other components within normal limits  BASIC METABOLIC PANEL - Abnormal; Notable for the following:    Creatinine, Ser 1.17 (*)    GFR calc non Af Amer 43 (*)    GFR calc Af Amer 50 (*)    All other components within normal limits    EKG  EKG Interpretation  Date/Time:  Monday January 15 2017 05:53:20 EDT Ventricular Rate:  82 PR Interval:    QRS Duration: 163 QT Interval:  455 QTC Calculation: 532 R Axis:   109 Text Interpretation:  Paced rhythm No significant change since last tracing Confirmed by Antolin Belsito  MD, Bardia Wangerin ([id number]) on 01/15/2017 5:56:54 AM       Radiology Dg Chest 2 View  Result Date: 01/15/2017 CLINICAL DATA:  83 y/o  F; productive cough for 1-2 weeks. EXAM: CHEST  2 VIEW COMPARISON:  02/28/2016 chest radiograph FINDINGS: Stable cardiomegaly given projection and technique. Aortic atherosclerosis with calcification. Bronchitic changes in the lung bases. No pleural effusion or pneumothorax. Bones are unremarkable. Two lead AICD noted. IMPRESSION: Bronchitic changes in the lung bases. No pleural effusion or edema. Stable cardiomegaly. Electronically Signed   By: Kristine Garbe M.D.   On: 01/15/2017 05:06    Procedures Procedures (including critical care time)  Medications Ordered in ED Medications  ipratropium-albuterol (DUONEB) 0.5-2.5 (3) MG/3ML nebulizer solution 3 mL (3 mLs Nebulization Given 01/15/17 0559)  benzonatate (TESSALON) capsule 100 mg (100 mg Oral Given 01/15/17 0604)     Initial Impression / Assessment and Plan / ED Course  I have reviewed the triage  vital signs and the nursing notes.  Pertinent labs & imaging results that were available during my care of the patient were reviewed by me and considered in my medical decision making (see chart for details).     Patient presents with cough. Ongoing for the last 2 weeks. Worsening at night and now productive. Denies fevers. She is nontoxic-appearing. Vital signs reassuring. Afebrile. She is in no acute respiratory distress and satting 100% on room air. Diminished breath sounds in all lung fields with an occasional wheeze. X-ray obtained and shows no evidence of pneumonia. Basic labwork obtained and at the patient's baseline. No leukocytosis. DuoNeb ordered. Patient was also given Tessalon Perles. On recheck, she states that she feels better. Given history of asthma/COPD, will elect to treat with an inhaler and steroids. Patient states that she has an inhaler at home. Given age and production of cough, will also give doxycycline to cover for early pneumonia as clinically this would be a concern.  After history, exam, and medical workup I feel the patient has been appropriately medically screened and is safe for discharge home. Pertinent diagnoses were discussed with the patient. Patient was given return precautions.   Final Clinical Impressions(s) / ED Diagnoses   Final diagnoses:  Cough    New Prescriptions New Prescriptions   BENZONATATE (TESSALON) 100 MG CAPSULE    Take 1 capsule (100 mg total) by mouth 3 (three) times daily as needed for cough.     [name], MD 01/15/17 [phone]

## 2017-01-15 NOTE — ED Triage Notes (Addendum)
Pt 's son unable to pick Pt up for transport to [place]. TC to [place] and there is not a transportation service that comes from [place]. Pt DC undone and Pt placed in [address] way bed to wait for [facility] to transport PT to [facility]. Report given to [employer] . TC to [place] to report PT will be transported via [facility].  Pt waiting in hall way bed  H A -1 . Pt provided with a drink while waiting for [facility].

## 2017-02-16 ENCOUNTER — Observation Stay (HOSPITAL_COMMUNITY)
Admission: EM | Admit: 2017-02-16 | Discharge: 2017-02-17 | Disposition: A | Discharge status: Home / Self Care | Payer: Medicare HMO | Attending: Internal Medicine | Admitting: Internal Medicine

## 2017-02-16 ENCOUNTER — Observation Stay (HOSPITAL_COMMUNITY): Payer: Medicare HMO

## 2017-02-16 ENCOUNTER — Emergency Department (HOSPITAL_COMMUNITY): Payer: Medicare HMO

## 2017-02-16 ENCOUNTER — Encounter (HOSPITAL_COMMUNITY): Payer: Self-pay | Admitting: *Deleted

## 2017-02-16 DIAGNOSIS — M19032 Primary osteoarthritis, left wrist: Secondary | ICD-10-CM | POA: Insufficient documentation

## 2017-02-16 DIAGNOSIS — D649 Anemia, unspecified: Secondary | ICD-10-CM | POA: Diagnosis not present

## 2017-02-16 DIAGNOSIS — N289 Disorder of kidney and ureter, unspecified: Secondary | ICD-10-CM | POA: Diagnosis not present

## 2017-02-16 DIAGNOSIS — E876 Hypokalemia: Secondary | ICD-10-CM | POA: Insufficient documentation

## 2017-02-16 DIAGNOSIS — E039 Hypothyroidism, unspecified: Secondary | ICD-10-CM | POA: Diagnosis not present

## 2017-02-16 DIAGNOSIS — Z85038 Personal history of other malignant neoplasm of large intestine: Secondary | ICD-10-CM | POA: Insufficient documentation

## 2017-02-16 DIAGNOSIS — I952 Hypotension due to drugs: Principal | ICD-10-CM | POA: Insufficient documentation

## 2017-02-16 DIAGNOSIS — Z933 Colostomy status: Secondary | ICD-10-CM | POA: Diagnosis not present

## 2017-02-16 DIAGNOSIS — R4182 Altered mental status, unspecified: Secondary | ICD-10-CM

## 2017-02-16 DIAGNOSIS — Z90710 Acquired absence of both cervix and uterus: Secondary | ICD-10-CM | POA: Insufficient documentation

## 2017-02-16 DIAGNOSIS — Z95810 Presence of automatic (implantable) cardiac defibrillator: Secondary | ICD-10-CM | POA: Diagnosis not present

## 2017-02-16 DIAGNOSIS — J449 Chronic obstructive pulmonary disease, unspecified: Secondary | ICD-10-CM | POA: Diagnosis not present

## 2017-02-16 DIAGNOSIS — I959 Hypotension, unspecified: Secondary | ICD-10-CM

## 2017-02-16 DIAGNOSIS — E119 Type 2 diabetes mellitus without complications: Secondary | ICD-10-CM | POA: Diagnosis not present

## 2017-02-16 DIAGNOSIS — R011 Cardiac murmur, unspecified: Secondary | ICD-10-CM | POA: Insufficient documentation

## 2017-02-16 DIAGNOSIS — I110 Hypertensive heart disease with heart failure: Secondary | ICD-10-CM | POA: Insufficient documentation

## 2017-02-16 DIAGNOSIS — Z8249 Family history of ischemic heart disease and other diseases of the circulatory system: Secondary | ICD-10-CM | POA: Insufficient documentation

## 2017-02-16 DIAGNOSIS — I5022 Chronic systolic (congestive) heart failure: Secondary | ICD-10-CM | POA: Insufficient documentation

## 2017-02-16 DIAGNOSIS — I447 Left bundle-branch block, unspecified: Secondary | ICD-10-CM | POA: Diagnosis not present

## 2017-02-16 DIAGNOSIS — E785 Hyperlipidemia, unspecified: Secondary | ICD-10-CM | POA: Insufficient documentation

## 2017-02-16 DIAGNOSIS — M1990 Unspecified osteoarthritis, unspecified site: Secondary | ICD-10-CM | POA: Insufficient documentation

## 2017-02-16 DIAGNOSIS — Z823 Family history of stroke: Secondary | ICD-10-CM | POA: Diagnosis not present

## 2017-02-16 DIAGNOSIS — T404X5A Adverse effect of other synthetic narcotics, initial encounter: Secondary | ICD-10-CM | POA: Diagnosis not present

## 2017-02-16 DIAGNOSIS — R55 Syncope and collapse: Secondary | ICD-10-CM | POA: Diagnosis not present

## 2017-02-16 DIAGNOSIS — J9811 Atelectasis: Secondary | ICD-10-CM | POA: Diagnosis not present

## 2017-02-16 DIAGNOSIS — Z7982 Long term (current) use of aspirin: Secondary | ICD-10-CM | POA: Insufficient documentation

## 2017-02-16 DIAGNOSIS — Z86711 Personal history of pulmonary embolism: Secondary | ICD-10-CM | POA: Diagnosis not present

## 2017-02-16 DIAGNOSIS — Z9842 Cataract extraction status, left eye: Secondary | ICD-10-CM | POA: Insufficient documentation

## 2017-02-16 DIAGNOSIS — Z9841 Cataract extraction status, right eye: Secondary | ICD-10-CM | POA: Insufficient documentation

## 2017-02-16 DIAGNOSIS — Z79899 Other long term (current) drug therapy: Secondary | ICD-10-CM | POA: Insufficient documentation

## 2017-02-16 LAB — CBC
HCT: 25.2 % — ABNORMAL LOW (ref 36.0–46.0)
Hemoglobin: 7.8 g/dL — ABNORMAL LOW (ref 12.0–15.0)
MCH: 26.4 pg (ref 26.0–34.0)
MCHC: 31 g/dL (ref 30.0–36.0)
MCV: 85.4 fL (ref 78.0–100.0)
Platelets: 331 K/uL (ref 150–400)
RBC: 2.95 MIL/uL — ABNORMAL LOW (ref 3.87–5.11)
RDW: 17.3 % — ABNORMAL HIGH (ref 11.5–15.5)
WBC: 8.3 K/uL (ref 4.0–10.5)

## 2017-02-16 LAB — COMPREHENSIVE METABOLIC PANEL WITH GFR
ALT: 26 U/L (ref 14–54)
AST: 58 U/L — ABNORMAL HIGH (ref 15–41)
Albumin: 3.1 g/dL — ABNORMAL LOW (ref 3.5–5.0)
Alkaline Phosphatase: 54 U/L (ref 38–126)
Anion gap: 11 (ref 5–15)
BUN: 13 mg/dL (ref 6–20)
CO2: 23 mmol/L (ref 22–32)
Calcium: 8.9 mg/dL (ref 8.9–10.3)
Chloride: 105 mmol/L (ref 101–111)
Creatinine, Ser: 1.23 mg/dL — ABNORMAL HIGH (ref 0.44–1.00)
GFR calc Af Amer: 47 mL/min — ABNORMAL LOW
GFR calc non Af Amer: 40 mL/min — ABNORMAL LOW
Glucose, Bld: 104 mg/dL — ABNORMAL HIGH (ref 65–99)
Potassium: 3.4 mmol/L — ABNORMAL LOW (ref 3.5–5.1)
Sodium: 139 mmol/L (ref 135–145)
Total Bilirubin: 0.5 mg/dL (ref 0.3–1.2)
Total Protein: 6.7 g/dL (ref 6.5–8.1)

## 2017-02-16 LAB — I-STAT CHEM 8, ED
BUN: 14 mg/dL (ref 6–20)
CHLORIDE: 103 mmol/L (ref 101–111)
CREATININE: 1.2 mg/dL — AB (ref 0.44–1.00)
Calcium, Ion: 1.1 mmol/L — ABNORMAL LOW (ref 1.15–1.40)
GLUCOSE: 102 mg/dL — AB (ref 65–99)
HEMATOCRIT: 25 % — AB (ref 36.0–46.0)
Hemoglobin: 8.5 g/dL — ABNORMAL LOW (ref 12.0–15.0)
POTASSIUM: 3.3 mmol/L — AB (ref 3.5–5.1)
Sodium: 140 mmol/L (ref 135–145)
TCO2: 25 mmol/L (ref 0–100)

## 2017-02-16 LAB — DIFFERENTIAL
Basophils Absolute: 0 K/uL (ref 0.0–0.1)
Basophils Relative: 0 %
Eosinophils Absolute: 0 K/uL (ref 0.0–0.7)
Eosinophils Relative: 1 %
Lymphocytes Relative: 19 %
Lymphs Abs: 1.6 K/uL (ref 0.7–4.0)
Monocytes Absolute: 0.6 K/uL (ref 0.1–1.0)
Monocytes Relative: 8 %
Neutro Abs: 6 K/uL (ref 1.7–7.7)
Neutrophils Relative %: 72 %

## 2017-02-16 LAB — AMMONIA: AMMONIA: 48 umol/L — AB (ref 9–35)

## 2017-02-16 LAB — APTT: aPTT: 38 seconds — ABNORMAL HIGH (ref 24–36)

## 2017-02-16 LAB — I-STAT CG4 LACTIC ACID, ED
LACTIC ACID, VENOUS: 1.86 mmol/L (ref 0.5–1.9)
LACTIC ACID, VENOUS: 0.87 mmol/L (ref 0.5–1.9)

## 2017-02-16 LAB — I-STAT TROPONIN, ED: TROPONIN I, POC: 0.03 ng/mL (ref 0.00–0.08)

## 2017-02-16 LAB — CBG MONITORING, ED: Glucose-Capillary: 88 mg/dL (ref 65–99)

## 2017-02-16 LAB — POC OCCULT BLOOD, ED: Fecal Occult Bld: NEGATIVE

## 2017-02-16 LAB — URIC ACID: URIC ACID, SERUM: 8.9 mg/dL — AB (ref 2.3–6.6)

## 2017-02-16 LAB — PROTIME-INR
INR: 1.18
PROTHROMBIN TIME: 15 s (ref 11.4–15.2)

## 2017-02-16 LAB — TSH: TSH: 1.797 u[IU]/mL (ref 0.350–4.500)

## 2017-02-16 MED ORDER — POLYETHYL GLYCOL-PROPYL GLYCOL 0.4-0.3 % OP SOLN: 1.0000 [drp] | Freq: Two times a day (BID) | OPHTHALMIC | Status: DC

## 2017-02-16 MED ORDER — FERROUS SULFATE 325 (65 FE) MG PO TABS
325.0000 mg | ORAL_TABLET | ORAL | Status: DC
  Filled 2017-02-16: qty 1

## 2017-02-16 MED ORDER — BENZONATATE 100 MG PO CAPS: 100.0000 mg | ORAL_CAPSULE | Freq: Three times a day (TID) | ORAL | Status: DC | PRN

## 2017-02-16 MED ORDER — SODIUM CHLORIDE 0.9 % IV BOLUS (SEPSIS)
1000.0000 mL | Freq: Once | INTRAVENOUS | Status: AC
  Administered 2017-02-16: 1000 mL via INTRAVENOUS

## 2017-02-16 MED ORDER — CARVEDILOL 3.125 MG PO TABS
3.1250 mg | ORAL_TABLET | Freq: Two times a day (BID) | ORAL | Status: DC
  Administered 2017-02-16 – 2017-02-17 (×2): 3.125 mg via ORAL
  Filled 2017-02-16 (×2): qty 1

## 2017-02-16 MED ORDER — ONDANSETRON HCL 4 MG/2ML IJ SOLN: 4.0000 mg | Freq: Four times a day (QID) | INTRAMUSCULAR | Status: DC | PRN

## 2017-02-16 MED ORDER — ASPIRIN EC 81 MG PO TBEC
81.0000 mg | DELAYED_RELEASE_TABLET | ORAL | Status: DC
  Filled 2017-02-16 (×2): qty 1

## 2017-02-16 MED ORDER — SERTRALINE HCL 25 MG PO TABS
25.0000 mg | ORAL_TABLET | Freq: Every day | ORAL | Status: DC
  Administered 2017-02-17: 25 mg via ORAL
  Filled 2017-02-16 (×2): qty 1

## 2017-02-16 MED ORDER — POTASSIUM CHLORIDE CRYS ER 10 MEQ PO TBCR
40.0000 meq | EXTENDED_RELEASE_TABLET | Freq: Once | ORAL | Status: AC
  Administered 2017-02-16: 40 meq via ORAL
  Filled 2017-02-16: qty 4

## 2017-02-16 MED ORDER — SENNOSIDES-DOCUSATE SODIUM 8.6-50 MG PO TABS: 1.0000 | ORAL_TABLET | Freq: Every evening | ORAL | Status: DC | PRN

## 2017-02-16 MED ORDER — MOMETASONE FURO-FORMOTEROL FUM 200-5 MCG/ACT IN AERO
2.0000 | INHALATION_SPRAY | Freq: Two times a day (BID) | RESPIRATORY_TRACT | Status: DC
  Filled 2017-02-16: qty 8.8

## 2017-02-16 MED ORDER — ONDANSETRON HCL 4 MG PO TABS: 4.0000 mg | ORAL_TABLET | Freq: Four times a day (QID) | ORAL | Status: DC | PRN

## 2017-02-16 MED ORDER — FAMOTIDINE 20 MG PO TABS
10.0000 mg | ORAL_TABLET | Freq: Every day | ORAL | Status: DC
  Administered 2017-02-16: 10 mg via ORAL
  Filled 2017-02-16: qty 1

## 2017-02-16 MED ORDER — FLORANEX PO PACK
1.0000 g | PACK | Freq: Three times a day (TID) | ORAL | Status: DC
  Administered 2017-02-16 – 2017-02-17 (×3): 1 g via ORAL
  Filled 2017-02-16 (×5): qty 1

## 2017-02-16 MED ORDER — ENOXAPARIN SODIUM 40 MG/0.4ML ~~LOC~~ SOLN
40.0000 mg | SUBCUTANEOUS | Status: DC
  Administered 2017-02-16: 40 mg via SUBCUTANEOUS
  Filled 2017-02-16: qty 0.4

## 2017-02-16 MED ORDER — ENSURE ENLIVE PO LIQD
237.0000 mL | Freq: Two times a day (BID) | ORAL | Status: DC
  Administered 2017-02-16 – 2017-02-17 (×3): 237 mL via ORAL

## 2017-02-16 MED ORDER — ACETAMINOPHEN 325 MG PO TABS
650.0000 mg | ORAL_TABLET | Freq: Four times a day (QID) | ORAL | Status: DC | PRN
  Administered 2017-02-16: 650 mg via ORAL
  Filled 2017-02-16: qty 2

## 2017-02-16 MED ORDER — ALBUTEROL SULFATE (2.5 MG/3ML) 0.083% IN NEBU: 2.5000 mg | INHALATION_SOLUTION | RESPIRATORY_TRACT | Status: DC | PRN

## 2017-02-16 MED ORDER — POTASSIUM CHLORIDE ER 10 MEQ PO TBCR
40.0000 meq | EXTENDED_RELEASE_TABLET | Freq: Every day | ORAL | Status: DC
  Filled 2017-02-16: qty 4

## 2017-02-16 MED ORDER — POLYSACCHARIDE IRON COMPLEX 150 MG PO CAPS: 150.0000 mg | ORAL_CAPSULE | Freq: Every day | ORAL | Status: DC

## 2017-02-16 MED ORDER — SODIUM CHLORIDE 0.9 % IV BOLUS (SEPSIS)
500.0000 mL | Freq: Once | INTRAVENOUS | Status: AC
  Administered 2017-02-16: 500 mL via INTRAVENOUS

## 2017-02-16 MED ORDER — POTASSIUM CHLORIDE ER 10 MEQ PO TBCR: 10.0000 meq | EXTENDED_RELEASE_TABLET | Freq: Every day | ORAL | Status: DC

## 2017-02-16 MED ORDER — ATORVASTATIN CALCIUM 20 MG PO TABS
20.0000 mg | ORAL_TABLET | Freq: Every day | ORAL | Status: DC
  Administered 2017-02-17: 20 mg via ORAL
  Filled 2017-02-16 (×2): qty 1

## 2017-02-16 MED ORDER — LEVOTHYROXINE SODIUM 50 MCG PO TABS
50.0000 ug | ORAL_TABLET | ORAL | Status: DC
  Filled 2017-02-16: qty 1

## 2017-02-16 NOTE — ED Notes (Signed)
Spoke to staff at [employer] to question an unknown medication pt states she received from her wrist pain. Med Tech states she was given a Tramadol.

## 2017-02-16 NOTE — ED Notes (Signed)
1355 spoke with [employer] rep who states pt has had no rhythm changes. She will fax over official report for chart

## 2017-02-16 NOTE — ED Notes (Signed)
Code Stroke cancelled by stroke team. EDP aware.

## 2017-02-16 NOTE — Consult Note (Signed)
Reason for Consult: Code Stroke Referring Physician: ER  [name] is an 83 y.o. female.  HPI: Transferred from nursing home after altered mental status, dysarthria, and ? left sided weakness. LSN 10:30 am.  CT Brain was normal for age.  Further history indicated that she received Tramadol,  Not on her regular med list, right before onset of symptoms.  She had initially become apneic, EMS arrived to bag her, and then when she perked up she had above symptoms.    Past Medical History:  Diagnosis Date  . Anginal pain ([place])   . Arthritis    "comes and goes" (03/01/2015)  . Asthma   . CHF (congestive heart failure) ([place])   . Colon cancer ([place])   . Colostomy care ([place])   . COPD (chronic obstructive pulmonary disease) ([place])   . Diabetes mellitus without complication ([place])    pt denies this hx on 03/01/2015  . Heart murmur   . History of blood transfusion    "related to anemia, this is my 1st" (03/01/2015)  . Hyperlipidemia   . Hypertension   . Hypothyroidism   . Pneumonia    "just once" (03/01/2015)  . Presence of combination internal cardiac defibrillator (ICD) and pacemaker    2005 in [facility]T  . Pulmonary embolism ([place])   . Renal disorder     Past Surgical History:  Procedure Laterality Date  . ABDOMINAL HYSTERECTOMY    . ANKLE FRACTURE SURGERY Left   . CARDIAC CATHETERIZATION N/A 03/22/2015   Procedure: Right/Left Heart Cath and Coronary Angiography;  Surgeon: Leonie Man, MD;  Location: [facility];  Service: Cardiovascular;  Laterality: N/A;  . CATARACT EXTRACTION W/ INTRAOCULAR LENS  IMPLANT, BILATERAL Bilateral   . CHOLECYSTECTOMY    . COLON SURGERY     cancer  . DILATION AND CURETTAGE OF UTERUS    . FRACTURE SURGERY    . TUBAL LIGATION      Family History  Problem Relation Age of Onset  . CAD Unknown   . Hypertension Unknown   . Stroke Unknown   . Colon cancer Neg Hx   . GI Bleed Neg Hx     Social History:  reports that she has never smoked. She has  never used smokeless tobacco. She reports that she does not drink alcohol or use drugs.  Allergies: No Known Allergies  Prior to Admission medications   Medication Sig Start Date End Date Taking? Authorizing Provider  acetaminophen (TYLENOL) 325 MG tablet Take 650 mg by mouth every 6 (six) hours as needed (pain).    [provider]  ADVAIR DISKUS 500-50 MCG/DOSE AEPB Inhale 1 puff into the lungs every 12 (twelve) hours. 12/28/14   [provider]  albuterol (PROVENTIL) (2.5 MG/3ML) 0.083% nebulizer solution Take 3 mLs (2.5 mg total) by nebulization every 4 (four) hours as needed for wheezing. 04/28/15   Geradine Girt, DO  aspirin EC 81 MG tablet Take 1 tablet (81 mg total) by mouth every morning. 03/03/15   Rai, Ripudeep K, MD  atorvastatin (LIPITOR) 20 MG tablet Take 20 mg by mouth daily.     [provider]  benzonatate (TESSALON) 100 MG capsule Take 1 capsule (100 mg total) by mouth 3 (three) times daily as needed for cough. 01/15/17   Horton, Barbette Hair, MD  carvedilol (COREG) 3.125 MG tablet Take 1 tablet (3.125 mg total) by mouth 2 (two) times daily with a meal. 02/04/16   Bonnell Public, MD  doxycycline (VIBRAMYCIN) 100 MG capsule Take 1 capsule (100 mg total) by mouth 2 (two) times daily. 01/15/17   Horton, Barbette Hair, MD  feeding supplement, ENSURE ENLIVE, (ENSURE ENLIVE) LIQD Take 237 mLs by mouth 2 (two) times daily between meals. Patient taking differently: Take 237 mLs by mouth 2 (two) times daily.  03/02/15   Rai, Ripudeep Raliegh Ip, MD  ferrous sulfate 325 (65 FE) MG tablet Take 325 mg by mouth every morning.    [provider]  furosemide (LASIX) 40 MG tablet Take 1 tablet (40 mg total) by mouth daily. 12/30/15   Shirley Friar, PA-C  hydrALAZINE (APRESOLINE) 25 MG tablet Take 0.5 tablets (12.5 mg total) by mouth 3 (three) times daily. 12/23/15   Clegg, Amy D, NP  HYDROcodone-acetaminophen (NORCO/VICODIN) 5-325 MG tablet Take 1 tablet by mouth every  4 (four) hours as needed for moderate pain.    [provider]  iron polysaccharides (NIFEREX) 150 MG capsule Take 150 mg by mouth daily.    [provider]  isosorbide mononitrate (IMDUR) 30 MG 24 hr tablet Take 1 tablet (30 mg total) by mouth daily. 03/24/15   Dhungel, Flonnie Overman, MD  lactobacillus (FLORANEX/LACTINEX) PACK Take 1 g by mouth 4 (four) times daily -  with meals and at bedtime.    [provider]  lactobacilus acidophilus & bulgar (FLORANEX) TABS chewable tablet Take 1 tablet by mouth 3 (three) times daily with meals. 07/29/15   Florencia Reasons, MD  levothyroxine (SYNTHROID, LEVOTHROID) 50 MCG tablet Take 50 mcg by mouth every morning.  01/16/15   [provider]  lisinopril (PRINIVIL,ZESTRIL) 20 MG tablet Take 20 mg by mouth daily.    [provider]  oxymetazoline (AFRIN) 0.05 % nasal spray Place 1 spray into both nostrils 2 (two) times daily as needed (for nose bleeds). Reported on 12/23/2015    [provider]  Polyethyl Glycol-Propyl Glycol (SYSTANE) 0.4-0.3 % SOLN Place 1 drop into both eyes 2 (two) times daily. 02/28/16   Carmin Muskrat, MD  potassium chloride (K-DUR) 10 MEQ tablet Take 1 tablet (10 mEq total) by mouth daily. 10/28/15   Noemi Chapel, MD  predniSONE (DELTASONE) 20 MG tablet Take 2 tablets (40 mg total) by mouth daily. 01/15/17   Horton, Barbette Hair, MD  ranitidine (ZANTAC) 150 MG tablet Take 150 mg by mouth daily.  01/14/15   [provider]  sertraline (ZOLOFT) 25 MG tablet Take 25 mg by mouth daily.    [provider]    Medications: Prior to Admission:  (Not in a hospital admission)  Results for orders placed or performed during the hospital encounter of 02/16/17 (from the past 48 hour(s))  I-Stat Chem 8, ED     Status: Abnormal   Collection Time: 02/16/17 12:34 PM  Result Value Ref Range   Sodium 140 135 - 145 mmol/L   Potassium 3.3 (L) 3.5 - 5.1 mmol/L   Chloride 103 101 - 111 mmol/L   BUN 14 6 -  20 mg/dL   Creatinine, Ser 1.20 (H) 0.44 - 1.00 mg/dL   Glucose, Bld 102 (H) 65 - 99 mg/dL   Calcium, Ion 1.10 (L) 1.15 - 1.40 mmol/L   TCO2 25 0 - 100 mmol/L   Hemoglobin 8.5 (L) 12.0 - 15.0 g/dL   HCT 25.0 (L) 36.0 - 46.0 %    Ct Head Code Stroke W/o Cm  Result Date: 02/16/2017 CLINICAL DATA:  Code stroke. Acute presentation with left-sided weakness, slurred speech. EXAM: CT HEAD  WITHOUT CONTRAST TECHNIQUE: Contiguous axial images were obtained from the base of the skull through the vertex without intravenous contrast. COMPARISON:  02/28/2016 FINDINGS: Brain: Normal appearance for age. Mild volume loss. No evidence of old or acute infarction, mass lesion, hemorrhage, hydrocephalus or extra-axial collection. Vascular: There is atherosclerotic calcification of the major vessels at the base of the brain. Skull: Negative Sinuses/Orbits: Mild mucosal disease in the frontal and ethmoid regions. Orbits negative. Other: None ASPECTS ([place] Stroke Program Early CT Score) - Ganglionic level infarction (caudate, lentiform nuclei, internal capsule, insula, M1-M3 cortex): 7 - Supraganglionic infarction (M4-M6 cortex): 3 Total score (0-10 with 10 being normal): 10 IMPRESSION: 1. No acute finding.  Normal appearance of the brain for age. 2. ASPECTS is 10. Page placed at the time of interpretation on 02/16/2017 at 12:25 pm to Dr. Patrcia Dolly . Electronically Signed   By: Nelson Chimes M.D.   On: 02/16/2017 12:28    ROS Weight 91.2 kg (201 lb 1 oz). Neurologic Examination: Awake, alert, oriented. Mild dysarthria. Face symmetrical.  Tongue midline. EOMI. PERL. Naming, repetition, fluency, comprehension- intact. No babinski.  No hoffman's. Strength 5/5 BUE and BLE.   Sensation- intact. Coord - FTN intact.  Assessment/Plan:  This does not appear to be a stroke presentation and therefore not a IV tPA candidate.  I suspect she had adverse effects of Tramadol.  No further neurological work up is necessary.     Rogue Jury, MD 02/16/2017, 12:39 PM

## 2017-02-16 NOTE — ED Triage Notes (Signed)
To ED via [facility], from [facility], for eval of possible code stroke. Pt was found by staff slumped over and not responding. Fire dept found pt apneic. EMS assisted ventilations with BVM and on arrival to ED pt was alert and breathing on own. Taken to CT 2 after clearance by EDP

## 2017-02-16 NOTE — H&P (Addendum)
History and Physical    [name] GYF:[id number] DOB: 03/10/1934 DOA: 02/16/2017  PCP: Reymundo Poll, MD  Patient coming from:SNF  Chief Complaint:AMS  HPI: [name] is a 83 y.o. female with medical history significant of colon cancer status post colostomy, hypertension, CHF, has ICD, arthritis of multiple joints, hyperlipidemia, nursing home resident transferred to the hospital after patient was found confused and had brief dysarthria. As per EMS, patient was briefly back however immediately she woke up. Apparently patient received tramadol for her arthritis pain. This is her new medication. ED Course: In the ED initially stroke code was called which was later canceled. Patient's mental status improved. CT scan of the head unremarkable. Evaluated by a neurologist recommended no further evaluation and this is likely due to tramadol. Initially patient was found to be hypotensive which was improved after IV fluid. He had wanted to admit patient for observation. Chest x-ray and UA unremarkable.  Review of Systems: . Patient denied headache, dizziness, nausea, vomiting, chest pain, shortness of breath, abdominal pain. Reported a stable arthritis pain.   Past Medical History:  Diagnosis Date  . Anginal pain ([place])   . Arthritis    "comes and goes" (03/01/2015)  . Asthma   . CHF (congestive heart failure) ([place])   . Colon cancer ([place])   . Colostomy care ([place])   . COPD (chronic obstructive pulmonary disease) ([place])   . Diabetes mellitus without complication ([place])    pt denies this hx on 03/01/2015  . Heart murmur   . History of blood transfusion    "related to anemia, this is my 1st" (03/01/2015)  . Hyperlipidemia   . Hypertension   . Hypothyroidism   . Pneumonia    "just once" (03/01/2015)  . Presence of combination internal cardiac defibrillator (ICD) and pacemaker    2005 in [facility]  . Pulmonary embolism ([place])   . Renal disorder     Past Surgical History:  Procedure Laterality  Date  . ABDOMINAL HYSTERECTOMY    . ANKLE FRACTURE SURGERY Left   . CARDIAC CATHETERIZATION N/A 03/22/2015   Procedure: Right/Left Heart Cath and Coronary Angiography;  Surgeon: Leonie Man, MD;  Location: [facility];  Service: Cardiovascular;  Laterality: N/A;  . CATARACT EXTRACTION W/ INTRAOCULAR LENS  IMPLANT, BILATERAL Bilateral   . CHOLECYSTECTOMY    . COLON SURGERY     cancer  . DILATION AND CURETTAGE OF UTERUS    . FRACTURE SURGERY    . TUBAL LIGATION      Social history: reports that she has never smoked. She has never used smokeless tobacco. She reports that she does not drink alcohol or use drugs.  No Known Allergies allergies reviewed, no known drug allergy  Family History  Problem Relation Age of Onset  . CAD Unknown   . Hypertension Unknown   . Stroke Unknown   . Colon cancer Neg Hx   . GI Bleed Neg Hx      Prior to Admission medications   Medication Sig Start Date End Date Taking? Authorizing Provider  acetaminophen (TYLENOL) 325 MG tablet Take 650 mg by mouth every 6 (six) hours as needed (pain).    [provider]  ADVAIR DISKUS 500-50 MCG/DOSE AEPB Inhale 1 puff into the lungs every 12 (twelve) hours. 12/28/14   [provider]  albuterol (PROVENTIL) (2.5 MG/3ML) 0.083% nebulizer solution Take 3 mLs (2.5 mg total) by nebulization every 4 (four) hours as needed for wheezing. 04/28/15  Geradine Girt, DO  aspirin EC 81 MG tablet Take 1 tablet (81 mg total) by mouth every morning. 03/03/15   Rai, Ripudeep K, MD  atorvastatin (LIPITOR) 20 MG tablet Take 20 mg by mouth daily.     [provider]  benzonatate (TESSALON) 100 MG capsule Take 1 capsule (100 mg total) by mouth 3 (three) times daily as needed for cough. 01/15/17   Horton, Barbette Hair, MD  carvedilol (COREG) 3.125 MG tablet Take 1 tablet (3.125 mg total) by mouth 2 (two) times daily with a meal. 02/04/16   Bonnell Public, MD  doxycycline (VIBRAMYCIN) 100 MG capsule Take  1 capsule (100 mg total) by mouth 2 (two) times daily. 01/15/17   Horton, Barbette Hair, MD  feeding supplement, ENSURE ENLIVE, (ENSURE ENLIVE) LIQD Take 237 mLs by mouth 2 (two) times daily between meals. Patient taking differently: Take 237 mLs by mouth 2 (two) times daily.  03/02/15   Rai, Ripudeep Raliegh Ip, MD  ferrous sulfate 325 (65 FE) MG tablet Take 325 mg by mouth every morning.    [provider]  furosemide (LASIX) 40 MG tablet Take 1 tablet (40 mg total) by mouth daily. 12/30/15   Shirley Friar, PA-C  hydrALAZINE (APRESOLINE) 25 MG tablet Take 0.5 tablets (12.5 mg total) by mouth 3 (three) times daily. 12/23/15   Clegg, Amy D, NP  HYDROcodone-acetaminophen (NORCO/VICODIN) 5-325 MG tablet Take 1 tablet by mouth every 4 (four) hours as needed for moderate pain.    [provider]  iron polysaccharides (NIFEREX) 150 MG capsule Take 150 mg by mouth daily.    [provider]  isosorbide mononitrate (IMDUR) 30 MG 24 hr tablet Take 1 tablet (30 mg total) by mouth daily. 03/24/15   Dhungel, Flonnie Overman, MD  lactobacillus (FLORANEX/LACTINEX) PACK Take 1 g by mouth 4 (four) times daily -  with meals and at bedtime.    [provider]  lactobacilus acidophilus & bulgar (FLORANEX) TABS chewable tablet Take 1 tablet by mouth 3 (three) times daily with meals. 07/29/15   Florencia Reasons, MD  levothyroxine (SYNTHROID, LEVOTHROID) 50 MCG tablet Take 50 mcg by mouth every morning.  01/16/15   [provider]  lisinopril (PRINIVIL,ZESTRIL) 20 MG tablet Take 20 mg by mouth daily.    [provider]  oxymetazoline (AFRIN) 0.05 % nasal spray Place 1 spray into both nostrils 2 (two) times daily as needed (for nose bleeds). Reported on 12/23/2015    [provider]  Polyethyl Glycol-Propyl Glycol (SYSTANE) 0.4-0.3 % SOLN Place 1 drop into both eyes 2 (two) times daily. 02/28/16   Carmin Muskrat, MD  potassium chloride (K-DUR) 10 MEQ tablet Take 1 tablet (10 mEq total)  by mouth daily. 10/28/15   Noemi Chapel, MD  predniSONE (DELTASONE) 20 MG tablet Take 2 tablets (40 mg total) by mouth daily. 01/15/17   Horton, Barbette Hair, MD  ranitidine (ZANTAC) 150 MG tablet Take 150 mg by mouth daily.  01/14/15   [provider]  sertraline (ZOLOFT) 25 MG tablet Take 25 mg by mouth daily.    [provider]    Physical Exam: Vitals:   02/16/17 1430 02/16/17 1445 02/16/17 1500 02/16/17 1515  BP: (!) 108/54 (!) 108/51 (!) 106/57 (!) 120/53  Pulse: 68 66 65 69  Resp: 18 16 17  (!) 21  Temp:      TempSrc:      SpO2: 95% 96% 96% 98%  Weight:  Constitutional: NAD, calm, comfortable Vitals:   02/16/17 1430 02/16/17 1445 02/16/17 1500 02/16/17 1515  BP: (!) 108/54 (!) 108/51 (!) 106/57 (!) 120/53  Pulse: 68 66 65 69  Resp: 18 16 17  (!) 21  Temp:      TempSrc:      SpO2: 95% 96% 96% 98%  Weight:       ENMT: Mucous membranes are moist.  Neck: normal, supple Respiratory: clear to auscultation bilaterally, no wheezing, no crackles. Normal respiratory effort. No accessory muscle use.  Cardiovascular: Regular rate and rhythm. No extremity edema. s1s2 nl  Abdomen: Soft, nontender, nondistended, colostomy site clean.  Musculoskeletal: Left wrist swelling, no lower extremity edema. Skin: no rashes, lesions, ulcers. No induration Neurologic: Alert, awake, oriented 3. Strength 5/5 in all 4.  Psychiatric: Normal judgment and insight.  Normal mood.    Labs on Admission: I have personally reviewed following labs and imaging studies  CBC:  Recent Labs Lab 02/16/17 1211 02/16/17 1234  WBC 8.3  --   NEUTROABS 6.0  --   HGB 7.8* 8.5*  HCT 25.2* 25.0*  MCV 85.4  --   PLT 331  --    Basic Metabolic Panel:  Recent Labs Lab 02/16/17 1211 02/16/17 1234  NA 139 140  K 3.4* 3.3*  CL 105 103  CO2 23  --   GLUCOSE 104* 102*  BUN 13 14  CREATININE 1.23* 1.20*  CALCIUM 8.9  --    GFR: Estimated Creatinine Clearance: 42.6 mL/min (A) (by  C-G formula based on SCr of 1.2 mg/dL (H)). Liver Function Tests:  Recent Labs Lab 02/16/17 1211  AST 58*  ALT 26  ALKPHOS 54  BILITOT 0.5  PROT 6.7  ALBUMIN 3.1*   No results for input(s): LIPASE, AMYLASE in the last 168 hours.  Recent Labs Lab 02/16/17 1229  AMMONIA 48*   Coagulation Profile:  Recent Labs Lab 02/16/17 1211  INR 1.18   Cardiac Enzymes: No results for input(s): CKTOTAL, CKMB, CKMBINDEX, TROPONINI in the last 168 hours. BNP (last 3 results) No results for input(s): PROBNP in the last 8760 hours. HbA1C: No results for input(s): HGBA1C in the last 72 hours. CBG:  Recent Labs Lab 02/16/17 1347  GLUCAP 88   Lipid Profile: No results for input(s): CHOL, HDL, LDLCALC, TRIG, CHOLHDL, LDLDIRECT in the last 72 hours. Thyroid Function Tests: No results for input(s): TSH, T4TOTAL, FREET4, T3FREE, THYROIDAB in the last 72 hours. Anemia Panel: No results for input(s): VITAMINB12, FOLATE, FERRITIN, TIBC, IRON, RETICCTPCT in the last 72 hours. Urine analysis:    Component Value Date/Time   COLORURINE YELLOW 02/28/2016 1120   APPEARANCEUR CLEAR 02/28/2016 1120   LABSPEC 1.015 02/28/2016 1120   PHURINE 5.5 02/28/2016 1120   GLUCOSEU NEGATIVE 02/28/2016 1120   HGBUR NEGATIVE 02/28/2016 1120   BILIRUBINUR NEGATIVE 02/28/2016 1120   KETONESUR NEGATIVE 02/28/2016 1120   PROTEINUR NEGATIVE 02/28/2016 1120   UROBILINOGEN 0.2 07/23/2015 1610   NITRITE NEGATIVE 02/28/2016 1120   LEUKOCYTESUR NEGATIVE 02/28/2016 1120   Sepsis Labs: !!!!!!!!!!!!!!!!!!!!!!!!!!!!!!!!!!!!!!!!!!!! @LABRCNTIP (procalcitonin:4,lacticidven:4) )No results found for this or any previous visit (from the past 240 hour(s)).   Radiological Exams on Admission: Dg Chest 2 View  Result Date: 02/16/2017 CLINICAL DATA:  Cough and confusion EXAM: CHEST  2 VIEW COMPARISON:  01/15/2017 FINDINGS: Lungs are very under aerated with bibasilar atelectasis. Double lead left subclavian AICD device and  leads are stable and intact. No pneumothorax. No pleural effusion. Mild cardiomegaly is stable. IMPRESSION: Bibasilar atelectasis. Electronically Signed  By: Marybelle Killings M.D.   On: 02/16/2017 13:48   Ct Head Code Stroke W/o Cm  Result Date: 02/16/2017 CLINICAL DATA:  Code stroke. Acute presentation with left-sided weakness, slurred speech. EXAM: CT HEAD WITHOUT CONTRAST TECHNIQUE: Contiguous axial images were obtained from the base of the skull through the vertex without intravenous contrast. COMPARISON:  02/28/2016 FINDINGS: Brain: Normal appearance for age. Mild volume loss. No evidence of old or acute infarction, mass lesion, hemorrhage, hydrocephalus or extra-axial collection. Vascular: There is atherosclerotic calcification of the major vessels at the base of the brain. Skull: Negative Sinuses/Orbits: Mild mucosal disease in the frontal and ethmoid regions. Orbits negative. Other: None ASPECTS ([place] Stroke Program Early CT Score) - Ganglionic level infarction (caudate, lentiform nuclei, internal capsule, insula, M1-M3 cortex): 7 - Supraganglionic infarction (M4-M6 cortex): 3 Total score (0-10 with 10 being normal): 10 IMPRESSION: 1. No acute finding.  Normal appearance of the brain for age. 2. ASPECTS is 10. Page placed at the time of interpretation on 02/16/2017 at 12:25 pm to Dr. Patrcia Dolly . Electronically Signed   By: Nelson Chimes M.D.   On: 02/16/2017 12:28    EKG: Independently reviewed. Sinus rhythm, no ischemic changes, unchanged from before.  Assessment/Plan Active Problems:   Faintness   Hypotension  #Dysarthria/brief altered mental status mostly confusion likely adverse effect of tramadol. This is her new medication for pain management. Her mental status improved and now she is oriented 3. Already evaluated by neurologist recommended no further workup. CT scan of head negative. Continue supportive care and observation night. -Holding narcotics for tramadol tonight. Continue  Tylenol.  # Hypotension likely due to pain medication and in the top of multiple oral blood pressure medicine: Urine negative, chest x-ray unremarkable. No sign of infection. -Holding most of her cardiac medications including hydralazine, Imdur, lisinopril, Lasix.  -Patient can take oral therefore resume diet. Patient already received IV fluid in the ER. We will not continue IV fluid because of history of CHF. -Blood pressure already improved.  #Chronic systolic congestive heart failure: Most of the cardia medication on hold because of hypotension. We'll resume medication gradually. Monitor blood pressure and heart rate closely. Patient is euvolemic and clinically stable.  #Osteoarthritis of multiple joints: Patient has a left upper wrist mild swelling and complaining of pain. I'll obtain x-ray of the wrist and check uric acid level.  #Hypothyroidism: Continue Synthroid. Check TSH level.  #Hypokalemia: Replete with oral potassium chloride. Repeat lab in the morning  #Chronic normocytic anemia: Hemoglobin around baseline. No sign of bleeding. Continue oral iron.  Consult social worker for discharge to skilled facility likely tomorrow.   DVT prophylaxis: Lovenox subcutaneous Code Status: Full code Family Communication: No family at bedside Disposition Plan: Likely discharge to a skilled facility tomorrow Consults called: None Admission status: Observation   Shikara Mcauliffe Tanna Furry MD [facility] Pager [phone]  If 7PM-7AM, please contact night-coverage [email or web address] Password [email or web address]  02/16/2017, 3:50 PM

## 2017-02-16 NOTE — Code Documentation (Signed)
83yo female arriving to [facility] via [place] at 1208.  EMS reports patient from ALF where she reported not feeling well at 1030, and patient was assisted to the chair by staff.  Later when checked on by staff patient was leaning over and not responding.  EMS was called and found patient apneic requiring BVMV.  On assessment patient had slurred speech and left sided weakness per EMS and code stroke was activated.  Patient to CT on arrival.  Stroke team to the bedside.  NIHSS 1, see documentation for details and code stroke times.  Patient with dysarthria on exam.  Patient reporting staff gave her medication which made her feel bad.  Of note, patient reports being unaware of what the medication was at the time and asked staff about the medication, however, was not told the name.  Facility called and report giving the patient Tramadol d/t c/o left wrist pain.  Of note, Tramadol is not listed on patient's medication list provided by EMS.  Neurologist at the bedside.  No acute stroke treatment at this time.  Code stroke canceled.  Bedside handoff with ED RN Melissa.

## 2017-02-16 NOTE — Progress Notes (Signed)
[name] [id number] Admission Data: 02/16/2017 6:10 PM Attending Provider: Rosita Fire, MD  ASN:[id number], Mallie Mussel, MD Consults/ Treatment Team:   [name] is a 83 y.o. female patient admitted from ED awake, alert  & orientated  X 3,  Full Code, VSS - Blood pressure 106/70, pulse 72, temperature 97.8 F (36.6 C), temperature source Oral, resp. rate 18, weight 91.2 kg (201 lb 1 oz), SpO2 97 %., no c/o shortness of breath, no c/o chest pain, no distress noted. Tele # 18 placed and pt is currently running:normal sinus rhythm.   IV site WDL:  forearm left, condition patent and no redness with a transparent dsg that's clean dry and intact.  Allergies:  No Known Allergies   Past Medical History:  Diagnosis Date  . Anginal pain ([place])   . Arthritis    "comes and goes" (03/01/2015)  . Asthma   . CHF (congestive heart failure) ([place])   . Colon cancer ([place])   . Colostomy care ([place])   . COPD (chronic obstructive pulmonary disease) ([place])   . Diabetes mellitus without complication ([place])    pt denies this hx on 03/01/2015  . Heart murmur   . History of blood transfusion    "related to anemia, this is my 1st" (03/01/2015)  . Hyperlipidemia   . Hypertension   . Hypothyroidism   . Pneumonia    "just once" (03/01/2015)  . Presence of combination internal cardiac defibrillator (ICD) and pacemaker    2005 in [facility]  . Pulmonary embolism ([place])   . Renal disorder     History:  obtained from the patient. Tobacco/alcohol: denied none  Pt orientation to unit, room and routine. Information packet given to patient/family and safety video watched.  Admission INP armband ID verified with patient/family, and in place. SR up x 2, fall risk assessment complete with Patient and family verbalizing understanding of risks associated with falls. Pt verbalizes an understanding of how to use the call bell and to call for help before getting out of bed.  Skin, clean-dry- intact without evidence of bruising,  or skin tears.   No evidence of skin break down noted on exam. no rashes, no ecchymoses, no petechiae    Will cont to monitor and assist as needed.  [place] A[place] Margaretha Sheffield, RN 02/16/2017 6:10 PM

## 2017-02-16 NOTE — ED Provider Notes (Signed)
[facility] DEPT Provider Note   CSN: [id number] Arrival date & time: 02/16/17  1208   An emergency department physician performed an initial assessment on this suspected stroke patient at 1208.  History   Chief Complaint Chief Complaint  Patient presents with  . Altered Mental Status    HPI [name] is a 83 y.o. female.  HPI Patient presents to the emergency room for evaluation of altered mental status. History was provided by the patient and EMS personnel. The patient states she started having pain in her left arm this morning. She has a history of this pain and that is associated with arthritis. Patient was given a new medication, Ultram for her arm pain. Sometime after that the patient was noted be unstressed responsive. EMS was called. Fire rescue indicated the patient had agonal breathing. When EMS arrived they found the patient to be awake.  Her speech was somewhat slurred however and she had some left arm weakness. Code stroke was activated. Patient denies any trouble with any chest pain. She denies any fever. No vomiting or diarrhea. She states she has had some congestion in her chest Past Medical History:  Diagnosis Date  . Anginal pain ([place])   . Arthritis    "comes and goes" (03/01/2015)  . Asthma   . CHF (congestive heart failure) ([place])   . Colon cancer ([place])   . Colostomy care ([place])   . COPD (chronic obstructive pulmonary disease) ([place])   . Diabetes mellitus without complication ([place])    pt denies this hx on 03/01/2015  . Heart murmur   . History of blood transfusion    "related to anemia, this is my 1st" (03/01/2015)  . Hyperlipidemia   . Hypertension   . Hypothyroidism   . Pneumonia    "just once" (03/01/2015)  . Presence of combination internal cardiac defibrillator (ICD) and pacemaker    2005 in [facility]  . Pulmonary embolism ([place])   . Renal disorder     Patient Active Problem List   Diagnosis Date Noted  . Syncope 02/02/2016  . Faintness   .  Essential hypertension 11/16/2015  . Hypothyroidism 11/16/2015  . Acute on chronic systolic (congestive) heart failure ([place]) 11/15/2015  . Hypertensive heart disease 10/04/2015  . Elevated troponin 10/04/2015  . Acute respiratory failure ([place])   . Acute systolic congestive heart failure ([place])   . NICM (nonischemic cardiomyopathy) ([place]) 08/11/2015  . Normal coronary arteries 08/11/2015  . Dyslipidemia 07/27/2015  . CKD (chronic kidney disease) stage 3, GFR 30-59 ml/min 07/27/2015  . Anemia of chronic disease 07/27/2015  . Benign essential HTN 07/27/2015  . Hypothyroidism, adult 07/27/2015  . FUO (fever of unknown origin) 07/27/2015  . IDA (iron deficiency anemia) 07/27/2015  . Chronic systolic CHF (congestive heart failure) ([place]) 07/27/2015  . Colon cancer ([place]) 07/27/2015  . Sepsis ([place]) 07/24/2015  . Acute encephalopathy 07/24/2015  . Hypokalemia 07/24/2015  . Dementia 07/23/2015  . COPD (chronic obstructive pulmonary disease) ([place]) 04/25/2015  . ICD in place 04/14/2015  . Colostomy in place [facility]) 03/01/2015    Past Surgical History:  Procedure Laterality Date  . ABDOMINAL HYSTERECTOMY    . ANKLE FRACTURE SURGERY Left   . CARDIAC CATHETERIZATION N/A 03/22/2015   Procedure: Right/Left Heart Cath and Coronary Angiography;  Surgeon: Leonie Man, MD;  Location: [facility];  Service: Cardiovascular;  Laterality: N/A;  . CATARACT EXTRACTION W/ INTRAOCULAR LENS  IMPLANT, BILATERAL Bilateral   . CHOLECYSTECTOMY    .  COLON SURGERY     cancer  . DILATION AND CURETTAGE OF UTERUS    . FRACTURE SURGERY    . TUBAL LIGATION      OB History    No data available       Home Medications    Prior to Admission medications   Medication Sig Start Date End Date Taking? Authorizing Provider  acetaminophen (TYLENOL) 325 MG tablet Take 650 mg by mouth every 6 (six) hours as needed (pain).    [provider]  ADVAIR DISKUS 500-50 MCG/DOSE AEPB Inhale 1 puff into the  lungs every 12 (twelve) hours. 12/28/14   [provider]  albuterol (PROVENTIL) (2.5 MG/3ML) 0.083% nebulizer solution Take 3 mLs (2.5 mg total) by nebulization every 4 (four) hours as needed for wheezing. 04/28/15   Geradine Girt, DO  aspirin EC 81 MG tablet Take 1 tablet (81 mg total) by mouth every morning. 03/03/15   Rai, Ripudeep K, MD  atorvastatin (LIPITOR) 20 MG tablet Take 20 mg by mouth daily.     [provider]  benzonatate (TESSALON) 100 MG capsule Take 1 capsule (100 mg total) by mouth 3 (three) times daily as needed for cough. 01/15/17   Horton, Barbette Hair, MD  carvedilol (COREG) 3.125 MG tablet Take 1 tablet (3.125 mg total) by mouth 2 (two) times daily with a meal. 02/04/16   Bonnell Public, MD  doxycycline (VIBRAMYCIN) 100 MG capsule Take 1 capsule (100 mg total) by mouth 2 (two) times daily. 01/15/17   Horton, Barbette Hair, MD  feeding supplement, ENSURE ENLIVE, (ENSURE ENLIVE) LIQD Take 237 mLs by mouth 2 (two) times daily between meals. Patient taking differently: Take 237 mLs by mouth 2 (two) times daily.  03/02/15   Rai, Ripudeep Raliegh Ip, MD  ferrous sulfate 325 (65 FE) MG tablet Take 325 mg by mouth every morning.    [provider]  furosemide (LASIX) 40 MG tablet Take 1 tablet (40 mg total) by mouth daily. 12/30/15   Shirley Friar, PA-C  hydrALAZINE (APRESOLINE) 25 MG tablet Take 0.5 tablets (12.5 mg total) by mouth 3 (three) times daily. 12/23/15   Clegg, Amy D, NP  HYDROcodone-acetaminophen (NORCO/VICODIN) 5-325 MG tablet Take 1 tablet by mouth every 4 (four) hours as needed for moderate pain.    [provider]  iron polysaccharides (NIFEREX) 150 MG capsule Take 150 mg by mouth daily.    [provider]  isosorbide mononitrate (IMDUR) 30 MG 24 hr tablet Take 1 tablet (30 mg total) by mouth daily. 03/24/15   Dhungel, Flonnie Overman, MD  lactobacillus (FLORANEX/LACTINEX) PACK Take 1 g by mouth 4 (four) times daily -  with meals and at  bedtime.    [provider]  lactobacilus acidophilus & bulgar (FLORANEX) TABS chewable tablet Take 1 tablet by mouth 3 (three) times daily with meals. 07/29/15   Florencia Reasons, MD  levothyroxine (SYNTHROID, LEVOTHROID) 50 MCG tablet Take 50 mcg by mouth every morning.  01/16/15   [provider]  lisinopril (PRINIVIL,ZESTRIL) 20 MG tablet Take 20 mg by mouth daily.    [provider]  oxymetazoline (AFRIN) 0.05 % nasal spray Place 1 spray into both nostrils 2 (two) times daily as needed (for nose bleeds). Reported on 12/23/2015    [provider]  Polyethyl Glycol-Propyl Glycol (SYSTANE) 0.4-0.3 % SOLN Place 1 drop into both eyes 2 (two) times daily. 02/28/16   Carmin Muskrat, MD  potassium chloride (K-DUR) 10 MEQ tablet Take 1 tablet (  10 mEq total) by mouth daily. 10/28/15   Noemi Chapel, MD  predniSONE (DELTASONE) 20 MG tablet Take 2 tablets (40 mg total) by mouth daily. 01/15/17   Horton, Barbette Hair, MD  ranitidine (ZANTAC) 150 MG tablet Take 150 mg by mouth daily.  01/14/15   [provider]  sertraline (ZOLOFT) 25 MG tablet Take 25 mg by mouth daily.    [provider]    Family History Family History  Problem Relation Age of Onset  . CAD Unknown   . Hypertension Unknown   . Stroke Unknown   . Colon cancer Neg Hx   . GI Bleed Neg Hx     Social History Social History  Substance Use Topics  . Smoking status: Never Smoker  . Smokeless tobacco: Never Used  . Alcohol use No     Allergies   Patient has no known allergies.   Review of Systems Review of Systems  All other systems reviewed and are negative.    Physical Exam Updated Vital Signs BP (!) 106/57   Pulse 65   Temp 97.8 F (36.6 C)   Resp 17   Wt 91.2 kg   SpO2 96%   BMI 32.45 kg/m   Physical Exam  Constitutional: No distress.  HENT:  Head: Normocephalic and atraumatic.  Right Ear: External ear normal.  Left Ear: External ear normal.  Eyes: Conjunctivae are  normal. Right eye exhibits no discharge. Left eye exhibits no discharge. No scleral icterus.  Neck: Neck supple. No tracheal deviation present.  Cardiovascular: Normal rate, regular rhythm and intact distal pulses.   Pulmonary/Chest: Effort normal and breath sounds normal. No stridor. No respiratory distress. She has no wheezes. She has no rales.  Abdominal: Soft. Bowel sounds are normal. She exhibits no distension. There is no tenderness. There is no rebound and no guarding.  Musculoskeletal: She exhibits no edema or tenderness.  Pain with range of motion of left arm and hand  Neurological: She is alert. No cranial nerve deficit (no facial droop, extraocular movements intact, speech is mildly slurred, no aphasia) or sensory deficit. She exhibits normal muscle tone. She displays no seizure activity. Coordination normal.  Generalized weakness however patient will move all extremities,  Skin: Skin is warm and dry. No rash noted.  Psychiatric: She has a normal mood and affect.  Nursing note and vitals reviewed.    ED Treatments / Results  Labs (all labs ordered are listed, but only abnormal results are displayed) Labs Reviewed  APTT - Abnormal; Notable for the following:       Result Value   aPTT 38 (*)    All other components within normal limits  CBC - Abnormal; Notable for the following:    RBC 2.95 (*)    Hemoglobin 7.8 (*)    HCT 25.2 (*)    RDW 17.3 (*)    All other components within normal limits  COMPREHENSIVE METABOLIC PANEL - Abnormal; Notable for the following:    Potassium 3.4 (*)    Glucose, Bld 104 (*)    Creatinine, Ser 1.23 (*)    Albumin 3.1 (*)    AST 58 (*)    GFR calc non Af Amer 40 (*)    GFR calc Af Amer 47 (*)    All other components within normal limits  AMMONIA - Abnormal; Notable for the following:    Ammonia 48 (*)    All other components within normal limits  I-STAT CHEM 8, ED - Abnormal; Notable  for the following:    Potassium 3.3 (*)     Creatinine, Ser 1.20 (*)    Glucose, Bld 102 (*)    Calcium, Ion 1.10 (*)    Hemoglobin 8.5 (*)    HCT 25.0 (*)    All other components within normal limits  PROTIME-INR  DIFFERENTIAL  URINALYSIS, ROUTINE W REFLEX MICROSCOPIC  I-STAT TROPOININ, ED  CBG MONITORING, ED  I-STAT TROPOININ, ED  POC OCCULT BLOOD, ED  I-STAT CG4 LACTIC ACID, ED    EKG  EKG Interpretation  Date/Time:  Friday Feb 16 2017 12:38:30 EDT Ventricular Rate:  66 PR Interval:    QRS Duration: 167 QT Interval:  525 QTC Calculation: 551 R Axis:   80 Text Interpretation:  sinus rhythm Prolonged PR interval Left bundle branch block No significant change since last tracing Reconfirmed by Jaren Vanetten  MD-J, Devina Bezold ([id number]) on 02/16/2017 1:11:20 PM       Radiology Dg Chest 2 View  Result Date: 02/16/2017 CLINICAL DATA:  Cough and confusion EXAM: CHEST  2 VIEW COMPARISON:  01/15/2017 FINDINGS: Lungs are very under aerated with bibasilar atelectasis. Double lead left subclavian AICD device and leads are stable and intact. No pneumothorax. No pleural effusion. Mild cardiomegaly is stable. IMPRESSION: Bibasilar atelectasis. Electronically Signed   By: Marybelle Killings M.D.   On: 02/16/2017 13:48   Ct Head Code Stroke W/o Cm  Result Date: 02/16/2017 CLINICAL DATA:  Code stroke. Acute presentation with left-sided weakness, slurred speech. EXAM: CT HEAD WITHOUT CONTRAST TECHNIQUE: Contiguous axial images were obtained from the base of the skull through the vertex without intravenous contrast. COMPARISON:  02/28/2016 FINDINGS: Brain: Normal appearance for age. Mild volume loss. No evidence of old or acute infarction, mass lesion, hemorrhage, hydrocephalus or extra-axial collection. Vascular: There is atherosclerotic calcification of the major vessels at the base of the brain. Skull: Negative Sinuses/Orbits: Mild mucosal disease in the frontal and ethmoid regions. Orbits negative. Other: None ASPECTS ([place] Stroke Program Early CT Score) -  Ganglionic level infarction (caudate, lentiform nuclei, internal capsule, insula, M1-M3 cortex): 7 - Supraganglionic infarction (M4-M6 cortex): 3 Total score (0-10 with 10 being normal): 10 IMPRESSION: 1. No acute finding.  Normal appearance of the brain for age. 2. ASPECTS is 10. Page placed at the time of interpretation on 02/16/2017 at 12:25 pm to Dr. Patrcia Dolly . Electronically Signed   By: Nelson Chimes M.D.   On: 02/16/2017 12:28    Procedures Procedures (including critical care time)  Medications Ordered in ED Medications  sodium chloride 0.9 % bolus 500 mL (0 mLs Intravenous Stopped 02/16/17 1406)  sodium chloride 0.9 % bolus 1,000 mL (1,000 mLs Intravenous New Bag/Given 02/16/17 1432)     Initial Impression / Assessment and Plan / ED Course  I have reviewed the triage vital signs and the nursing notes.  Pertinent labs & imaging results that were available during my care of the patient were reviewed by me and considered in my medical decision making (see chart for details).  Clinical Course as of Feb 17 1516  Fri Feb 16, 2017  1245 Patient was evaluated by the stroke team upon arrival to the emergency room. Code stroke was deactivated.  [JK]  1246 Hemoglobin is low at 7.8, one month ago was at 8.3 and 11 months ago was 7.8  [JK]  1414 BP remains low although it is improving with fluids.  Will continue to monitor  [JK]    Clinical Course User Index [JK] Dorie Rank, MD  Patient presented to the emergency room with possible code stroke. She was evaluated by the neurology code stroke team. Code stroke was deactivated. Patient was noted to be hypotensive on arrival. Her blood pressure has improved with IV fluids. I suspect her initial slurred speech was related to her hypotension and possibly from her medications. Etiology of her hypotension is unclear. She is anemic however this is chronic and her Hemoccult is negative. No signs of sepsis.  Doubt PE, ACS.  I will consult the medical service  for overnight observation.  Final Clinical Impressions(s) / ED Diagnoses   Final diagnoses:  Hypotension, unspecified hypotension type  Anemia, unspecified type     Dorie Rank, MD 02/16/17 1517

## 2017-02-16 NOTE — ED Notes (Signed)
Occult stool collected from pts colostomy

## 2017-02-16 NOTE — ED Notes (Signed)
Transported to xray. Alert. resp e/u.

## 2017-02-16 NOTE — ED Notes (Addendum)
Family states pt has a hx of anemia. Pt joking with them but states she feels woozy and her head is spinning

## 2017-02-16 NOTE — Progress Notes (Signed)
Called to receive report

## 2017-02-16 NOTE — ED Notes (Signed)
Returned from [facility]. Noted left forearm to be taught. NS stopped. Warm packs placed on arm. IV removed.

## 2017-02-17 DIAGNOSIS — I959 Hypotension, unspecified: Secondary | ICD-10-CM | POA: Diagnosis not present

## 2017-02-17 DIAGNOSIS — I952 Hypotension due to drugs: Secondary | ICD-10-CM | POA: Diagnosis not present

## 2017-02-17 DIAGNOSIS — R4182 Altered mental status, unspecified: Secondary | ICD-10-CM

## 2017-02-17 LAB — URINALYSIS, ROUTINE W REFLEX MICROSCOPIC
Bacteria, UA: NONE SEEN
Bilirubin Urine: NEGATIVE
GLUCOSE, UA: NEGATIVE mg/dL
Hgb urine dipstick: NEGATIVE
KETONES UR: NEGATIVE mg/dL
LEUKOCYTES UA: NEGATIVE
NITRITE: NEGATIVE
PH: 5 (ref 5.0–8.0)
Protein, ur: 30 mg/dL — AB
Specific Gravity, Urine: 1.017 (ref 1.005–1.030)

## 2017-02-17 LAB — BASIC METABOLIC PANEL
Anion gap: 11 (ref 5–15)
BUN: 22 mg/dL — AB (ref 6–20)
CALCIUM: 9 mg/dL (ref 8.9–10.3)
CO2: 22 mmol/L (ref 22–32)
CREATININE: 1.35 mg/dL — AB (ref 0.44–1.00)
Chloride: 107 mmol/L (ref 101–111)
GFR calc non Af Amer: 36 mL/min — ABNORMAL LOW (ref >60)
GFR, EST AFRICAN AMERICAN: 42 mL/min — AB (ref >60)
Glucose, Bld: 72 mg/dL (ref 65–99)
Potassium: 4.2 mmol/L (ref 3.5–5.1)
SODIUM: 140 mmol/L (ref 135–145)

## 2017-02-17 MED ORDER — LEVOTHYROXINE SODIUM 50 MCG PO TABS
50.0000 ug | ORAL_TABLET | Freq: Every day | ORAL | Status: DC
  Administered 2017-02-17: 50 ug via ORAL

## 2017-02-17 MED ORDER — FERROUS SULFATE 325 (65 FE) MG PO TABS
325.0000 mg | ORAL_TABLET | Freq: Every day | ORAL | Status: DC
  Administered 2017-02-17: 325 mg via ORAL
  Filled 2017-02-17: qty 1

## 2017-02-17 MED ORDER — ASPIRIN EC 81 MG PO TBEC
81.0000 mg | DELAYED_RELEASE_TABLET | Freq: Every day | ORAL | Status: DC
  Administered 2017-02-17: 81 mg via ORAL
  Filled 2017-02-17: qty 1

## 2017-02-17 NOTE — Clinical Social Work Placement (Signed)
   CLINICAL SOCIAL WORK PLACEMENT  NOTE  Date:  02/17/2017  Patient Details  Name: [name] MRN: [id number] Date of Birth: January 07, 1934  Clinical Social Work is seeking post-discharge placement for this patient at the [place] level of care (*CSW will initial, date and re-position this form in  chart as items are completed):  Yes   Patient/family provided with [place] Work Department's list of facilities offering this level of care within the geographic area requested by the patient (or if unable, by the patient's family).  Yes   Patient/family informed of their freedom to choose among providers that offer the needed level of care, that participate in Medicare, Medicaid or managed care program needed by the patient, have an available bed and are willing to accept the patient.  Yes   Patient/family informed of [facility]'s ownership interest in [facility] and [facility], as well as of the fact that they are under no obligation to receive care at these facilities.  PASRR submitted to EDS on       PASRR number received on       Existing PASRR number confirmed on 02/17/17     FL2 transmitted to all facilities in geographic area requested by pt/family on       FL2 transmitted to all facilities within larger geographic area on       Patient informed that his/her managed care company has contracts with or will negotiate with certain facilities, including the following:            Patient/family informed of bed offers received.  Patient chooses bed at  (Pt from [facility])     Physician recommends and patient chooses bed at      Patient to be transferred to  ([facility]) on 02/17/17.  Patient to be transferred to facility by  [facility])     Patient family notified on 02/17/17 of transfer.  Name of family member notified:  Son     PHYSICIAN Please sign FL2     Additional Comment:     _______________________________________________ Serafina Mitchell, [place] 02/17/2017, 12:53 PM

## 2017-02-17 NOTE — Care Management Note (Signed)
Case Management Note  Patient Details  Name: [name] MRN: [id number] Date of Birth: [birth year]  Subjective/Objective:                 Patient from [facility], in observation. Order to DC back. CM spoke with CSW [place] to faciliate DC to return to [facility] (will need ALF and facility to accept).    Action/Plan:  Dc to [facility].   Expected Discharge Date:  02/17/17               Expected Discharge Plan:  Assisted Living / Rest Home  In-House Referral:  Clinical Social Work  Discharge planning Services  CM Consult  Post Acute Care Choice:    Choice offered to:     DME Arranged:    DME Agency:     HH Arranged:    HH Agency:     Status of Service:  Completed, signed off  If discussed at [employer], dates discussed:    Additional Comments:  Carles Collet, RN 02/17/2017, 11:33 AM

## 2017-02-17 NOTE — Clinical Social Work Note (Signed)
CSW notified pt is medically stable and can go back to ALF.  CSW contacted ALF, [place] [address] @ [phone], spoke to med tech, ok to send pt back. Med tech could not find fax number so CSW will send FL2 and DC Summary with pt.  Clinical Social Worker facilitated patient discharge including contacting patient family (Son) and facility to confirm patient discharge plans.  Clinical information could not be  faxed to facility due to med tech not knowing the number, CSW will send with pt and family agreeable with plan. CSW also provided a list of ALF's to son, due to son not be happy with this facility. CSW arranged ambulance transport via [facility] to [place] to call report prior to discharge.  Clinical Social Worker will sign off for now as social work intervention is no longer needed. Please consult [place] again if new need arises.  [place] Serpe B. Joline Maxcy Clinical Social Work Dept Weekend Social Worker [phone] 12:30 PM

## 2017-02-17 NOTE — NC FL2 (Signed)
[place] MEDICAID FL2 LEVEL OF CARE SCREENING TOOL     IDENTIFICATION  Patient Name: [name] Birthdate: 23-Jun-1934 Sex: female Admission Date (Current Location): 02/16/2017  [facility] and [place] Number:  Herbalist and Address:  [facility] [place], [place] [address], [place],  [address]      Provider Number: [id number]  Attending Physician Name and Address:  Jani Gravel, MD  Relative Name and Phone Number:  Leatha Gilding [phone]    Current Level of Care: Hospital Recommended Level of Care: [place] Prior Approval Number:    Date Approved/Denied:   PASRR Number: [id number]  Discharge Plan: Other (Comment) (ALF)    Current Diagnoses: Patient Active Problem List   Diagnosis Date Noted  . Altered mental status   . Hypotension 02/16/2017  . Syncope 02/02/2016  . Faintness   . Essential hypertension 11/16/2015  . Hypothyroidism 11/16/2015  . Acute on chronic systolic (congestive) heart failure ([place]) 11/15/2015  . Hypertensive heart disease 10/04/2015  . Elevated troponin 10/04/2015  . Acute respiratory failure ([place])   . Acute systolic congestive heart failure ([place])   . NICM (nonischemic cardiomyopathy) ([place]) 08/11/2015  . Normal coronary arteries 08/11/2015  . Dyslipidemia 07/27/2015  . CKD (chronic kidney disease) stage 3, GFR 30-59 ml/min 07/27/2015  . Anemia 07/27/2015  . Benign essential HTN 07/27/2015  . Hypothyroidism, adult 07/27/2015  . FUO (fever of unknown origin) 07/27/2015  . IDA (iron deficiency anemia) 07/27/2015  . Chronic systolic CHF (congestive heart failure) ([place]) 07/27/2015  . Colon cancer ([place]) 07/27/2015  . Sepsis ([place]) 07/24/2015  . Acute encephalopathy 07/24/2015  . Hypokalemia 07/24/2015  . Dementia 07/23/2015  . COPD (chronic obstructive pulmonary disease) ([place]) 04/25/2015  . ICD in place 04/14/2015  . Colostomy in place [facility]) 03/01/2015    Orientation RESPIRATION  BLADDER Height & Weight     Self, Situation, Place  Normal Incontinent Weight: 201 lb 1 oz (91.2 kg) Height:  5\' 6"  (167.6 cm)  BEHAVIORAL SYMPTOMS/MOOD NEUROLOGICAL BOWEL NUTRITION STATUS      Incontinent Diet (See DC Summary)  AMBULATORY STATUS COMMUNICATION OF NEEDS Skin   Limited Assist Verbally Normal                       Personal Care Assistance Level of Assistance  Bathing, Dressing Bathing Assistance: Limited assistance   Dressing Assistance: Limited assistance     Functional Limitations Info  Sight, Speech, Hearing Sight Info: Adequate Hearing Info: Adequate Speech Info: Adequate    SPECIAL CARE FACTORS FREQUENCY                       Contractures Contractures Info: Not present    Additional Factors Info  Code Status, Allergies Code Status Info: Full Allergies Info: No Known Allergies           Current Medications (02/17/2017):  This is the current hospital active medication list Current Facility-Administered Medications  Medication Dose Route Frequency Provider Last Rate Last Dose  . acetaminophen (TYLENOL) tablet 650 mg  650 mg Oral Q6H PRN Rosita Fire, MD   650 mg at 02/16/17 1801  . albuterol (PROVENTIL) (2.5 MG/3ML) 0.083% nebulizer solution 2.5 mg  2.5 mg Nebulization Q4H PRN Rosita Fire, MD      . aspirin EC tablet 81 mg  81 mg Oral Daily Skeet Simmer, RPH   81 mg at 02/17/17 9628  . atorvastatin (LIPITOR)  tablet 20 mg  20 mg Oral Daily Rosita Fire, MD   20 mg at 02/17/17 2694  . benzonatate (TESSALON) capsule 100 mg  100 mg Oral TID PRN Rosita Fire, MD      . carvedilol (COREG) tablet 3.125 mg  3.125 mg Oral BID WC Rosita Fire, MD   3.125 mg at 02/17/17 0933  . enoxaparin (LOVENOX) injection 40 mg  40 mg Subcutaneous Q24H Rosita Fire, MD   40 mg at 02/16/17 1803  . famotidine (PEPCID) tablet 10 mg  10 mg Oral QHS Rosita Fire, MD   10 mg at 02/16/17 2201  . feeding  supplement (ENSURE ENLIVE) (ENSURE ENLIVE) liquid 237 mL  237 mL Oral BID Rosita Fire, MD   237 mL at 02/17/17 1000  . ferrous sulfate tablet 325 mg  325 mg Oral Q breakfast Skeet Simmer, RPH   325 mg at 02/17/17 0932  . lactobacillus (FLORANEX/LACTINEX) granules 1 g  1 g Oral TID WC & HS Rosita Fire, MD   1 g at 02/17/17 0933  . levothyroxine (SYNTHROID, LEVOTHROID) tablet 50 mcg  50 mcg Oral QAC breakfast Skeet Simmer, RPH   50 mcg at 02/17/17 0645  . mometasone-formoterol (DULERA) 200-5 MCG/ACT inhaler 2 puff  2 puff Inhalation BID Rosita Fire, MD      . ondansetron [facility]) tablet 4 mg  4 mg Oral Q6H PRN Rosita Fire, MD       Or  . ondansetron [facility]) injection 4 mg  4 mg Intravenous Q6H PRN Rosita Fire, MD      . senna-docusate (Senokot-S) tablet 1 tablet  1 tablet Oral QHS PRN Rosita Fire, MD      . sertraline (ZOLOFT) tablet 25 mg  25 mg Oral Daily Rosita Fire, MD   25 mg at 02/17/17 0932     Discharge Medications: Please see discharge summary for a list of discharge medications.  Allergies as of 02/17/2017   No Known Allergies        Medication List    STOP taking these medications   Polyethyl Glycol-Propyl Glycol 0.4-0.3 % Soln Commonly known as:  SYSTANE     TAKE these medications   acetaminophen 325 MG tablet Commonly known as:  TYLENOL Take 650 mg by mouth every 6 (six) hours as needed (pain).   albuterol (2.5 MG/3ML) 0.083% nebulizer solution Commonly known as:  PROVENTIL Take 3 mLs (2.5 mg total) by nebulization every 4 (four) hours as needed for wheezing.   aspirin EC 81 MG tablet Take 1 tablet (81 mg total) by mouth every morning. What changed:  when to take this   atorvastatin 20 MG tablet Commonly known as:  LIPITOR Take 20 mg by mouth daily.   benzonatate 100 MG capsule Commonly known as:  TESSALON Take 1 capsule (100 mg total) by mouth 3 (three) times daily as needed  for cough.   carvedilol 3.125 MG tablet Commonly known as:  COREG Take 1 tablet (3.125 mg total) by mouth 2 (two) times daily with a meal.   feeding supplement (ENSURE ENLIVE) Liqd Take 237 mLs by mouth 2 (two) times daily between meals.   FERREX 150 150 MG capsule Generic drug:  iron polysaccharides Take 150 mg by mouth 2 (two) times daily.   FLORANEX PO Take 1 tablet by mouth 3 (three) times daily with meals. What changed:  Another medication with the same name was removed. Continue taking  this medication, and follow the directions you see here.   Fluticasone-Salmeterol 500-50 MCG/DOSE Aepb Commonly known as:  ADVAIR Inhale 1 puff into the lungs 2 (two) times daily. Rinse mouth after use   furosemide 20 MG tablet Commonly known as:  LASIX Take 40 mg by mouth daily as needed (weight gain or 3 lbs in 24 hours or 5 lbs in 1 week).   furosemide 40 MG tablet Commonly known as:  LASIX Take 1 tablet (40 mg total) by mouth daily.   hydrALAZINE 25 MG tablet Commonly known as:  APRESOLINE Take 0.5 tablets (12.5 mg total) by mouth 3 (three) times daily.   HYDROcodone-acetaminophen 5-325 MG tablet Commonly known as:  NORCO/VICODIN Take 1 tablet by mouth every 4 (four) hours as needed (pain).   isosorbide mononitrate 30 MG 24 hr tablet Commonly known as:  IMDUR Take 1 tablet (30 mg total) by mouth daily.   levothyroxine 50 MCG tablet Commonly known as:  SYNTHROID, LEVOTHROID Take 50 mcg by mouth daily.   lisinopril 20 MG tablet Commonly known as:  PRINIVIL,ZESTRIL Take 20 mg by mouth daily.   oxymetazoline 0.05 % nasal spray Commonly known as:  AFRIN Place 1 spray into both nostrils 2 (two) times daily as needed (for nose bleeds). Reported on 12/23/2015   potassium chloride 10 MEQ tablet Commonly known as:  K-DUR Take 1 tablet (10 mEq total) by mouth daily.   ranitidine 150 MG tablet Commonly known as:  ZANTAC Take 150 mg by mouth daily.   REFRESH 1 %  ophthalmic solution Generic drug:  carboxymethylcellulose Place 1 drop into both eyes 3 (three) times daily.   sertraline 25 MG tablet Commonly known as:  ZOLOFT Take 25 mg by mouth daily.      Relevant Imaging Results:  Relevant Lab Results:   Additional Information [id number]  Linton Flemings B, LCSWA

## 2017-02-17 NOTE — Discharge Summary (Signed)
[name], is a 83 y.o. female  DOB 1934-03-17  MRN [id number].  Admission date:  02/16/2017  Admitting Physician  Dron Tanna Furry, MD  Discharge Date:  02/17/2017   Primary MD  Reymundo Poll, MD  Recommendations for primary care physician for things to follow:   #Dysarthria/brief altered mental status mostly confusion likely adverse effect of tramadol.  Avoid tramadol  # Hypotension likely due to pain medication and in the top of multiple oral blood pressure medicine: Urine negative, chest x-ray unremarkable. No sign of infection. bp back to baseline  #Chronic systolic congestive heart failure: Stable, can resume her medications  #Osteoarthritis of multiple joints:  stable  #Hypothyroidism: Continue Synthroid.   #Hypokalemia:  Resolved  #slightly abnormal lft Repeat cmp in 1 week If abnormal please r/o viral hepatitis and check RUQ ultrasound #Chronic normocytic anemia: Hemoglobin around baseline. No sign of bleeding. Continue oral iron.    Admission Diagnosis  Arthritis [M19.90] Hypotension, unspecified hypotension type [I95.9] Anemia, unspecified type [D64.9]   Discharge Diagnosis  Arthritis [M19.90] Hypotension, unspecified hypotension type [I95.9] Anemia, unspecified type [D64.9]    Active Problems:   Faintness   Hypotension      Past Medical History:  Diagnosis Date  . Anginal pain ([place])   . Arthritis    "comes and goes" (03/01/2015)  . Asthma   . CHF (congestive heart failure) ([place])   . Colon cancer ([place])   . Colostomy care ([place])   . COPD (chronic obstructive pulmonary disease) ([place])   . Diabetes mellitus without complication ([place])    pt denies this hx on 03/01/2015  . Heart murmur   . History of blood transfusion    "related to anemia, this is my 1st" (03/01/2015)  . Hyperlipidemia   . Hypertension   . Hypothyroidism   . Pneumonia    "just once"  (03/01/2015)  . Presence of combination internal cardiac defibrillator (ICD) and pacemaker    2005 in [facility]  . Pulmonary embolism ([place])   . Renal disorder     Past Surgical History:  Procedure Laterality Date  . ABDOMINAL HYSTERECTOMY    . ANKLE FRACTURE SURGERY Left   . CARDIAC CATHETERIZATION N/A 03/22/2015   Procedure: Right/Left Heart Cath and Coronary Angiography;  Surgeon: Leonie Man, MD;  Location: [facility];  Service: Cardiovascular;  Laterality: N/A;  . CATARACT EXTRACTION W/ INTRAOCULAR LENS  IMPLANT, BILATERAL Bilateral   . CHOLECYSTECTOMY    . COLON SURGERY     cancer  . DILATION AND CURETTAGE OF UTERUS    . FRACTURE SURGERY    . TUBAL LIGATION         HPI  from the history and physical done on the day of admission:      83 y.o. female with medical history significant of colon cancer status post colostomy, hypertension, CHF, has ICD, arthritis of multiple joints, hyperlipidemia, nursing home resident transferred to the hospital after patient was found confused and had brief dysarthria. As  per EMS, patient was briefly back however immediately she woke up. Apparently patient received tramadol for her arthritis pain. This is her new medication. ED Course: In the ED initially stroke code was called which was later canceled. Patient's mental status improved. CT scan of the head unremarkable. Evaluated by a neurologist recommended no further evaluation and this is likely due to tramadol. Initially patient was found to be hypotensive which was improved after IV fluid. He had wanted to admit patient for observation. Chest x-ray and UA unremarkable.  Review of Systems: . Patient denied headache, dizziness, nausea, vomiting, chest pain, shortness of breath, abdominal pain. Reported a stable arthritis pain.     Hospital Course:      Neurology consulted in ED, code stroke cancelled.  and thought that she had altered mental status secondary to Tramadol.  Pt was  observed overnite,  She is alert and oriented x3.  (person, place, time),  She feels back to baseline and blames the tramaqdol for her situation.  Pt denies weakness, numbness, tingling, headache, cp, palp, sob.  I spoke with son and informed him of her status. Pt asking to be discharged back to [facility].       Follow UP  Follow-up Information    Reymundo Poll, MD Follow up in 1 week(s).   Specialty:  Family Medicine Contact information: [address] [name] [address] [phone]            Consults obtained - neurology  Discharge Condition: stable  Diet and Activity recommendation: See Discharge Instructions below  Discharge Instructions         Discharge Medications     Allergies as of 02/17/2017   No Known Allergies     Medication List    STOP taking these medications   Polyethyl Glycol-Propyl Glycol 0.4-0.3 % Soln Commonly known as:  SYSTANE     TAKE these medications   acetaminophen 325 MG tablet Commonly known as:  TYLENOL Take 650 mg by mouth every 6 (six) hours as needed (pain).   albuterol (2.5 MG/3ML) 0.083% nebulizer solution Commonly known as:  PROVENTIL Take 3 mLs (2.5 mg total) by nebulization every 4 (four) hours as needed for wheezing.   aspirin EC 81 MG tablet Take 1 tablet (81 mg total) by mouth every morning. What changed:  when to take this   atorvastatin 20 MG tablet Commonly known as:  LIPITOR Take 20 mg by mouth daily.   benzonatate 100 MG capsule Commonly known as:  TESSALON Take 1 capsule (100 mg total) by mouth 3 (three) times daily as needed for cough.   carvedilol 3.125 MG tablet Commonly known as:  COREG Take 1 tablet (3.125 mg total) by mouth 2 (two) times daily with a meal.   feeding supplement (ENSURE ENLIVE) Liqd Take 237 mLs by mouth 2 (two) times daily between meals.   FERREX 150 150 MG capsule Generic drug:  iron polysaccharides Take 150 mg by mouth 2 (two) times daily.   FLORANEX  PO Take 1 tablet by mouth 3 (three) times daily with meals. What changed:  Another medication with the same name was removed. Continue taking this medication, and follow the directions you see here.   Fluticasone-Salmeterol 500-50 MCG/DOSE Aepb Commonly known as:  ADVAIR Inhale 1 puff into the lungs 2 (two) times daily. Rinse mouth after use   furosemide 20 MG tablet Commonly known as:  LASIX Take 40 mg by mouth daily as needed (weight gain or 3  lbs in 24 hours or 5 lbs in 1 week).   furosemide 40 MG tablet Commonly known as:  LASIX Take 1 tablet (40 mg total) by mouth daily.   hydrALAZINE 25 MG tablet Commonly known as:  APRESOLINE Take 0.5 tablets (12.5 mg total) by mouth 3 (three) times daily.   HYDROcodone-acetaminophen 5-325 MG tablet Commonly known as:  NORCO/VICODIN Take 1 tablet by mouth every 4 (four) hours as needed (pain).   isosorbide mononitrate 30 MG 24 hr tablet Commonly known as:  IMDUR Take 1 tablet (30 mg total) by mouth daily.   levothyroxine 50 MCG tablet Commonly known as:  SYNTHROID, LEVOTHROID Take 50 mcg by mouth daily.   lisinopril 20 MG tablet Commonly known as:  PRINIVIL,ZESTRIL Take 20 mg by mouth daily.   oxymetazoline 0.05 % nasal spray Commonly known as:  AFRIN Place 1 spray into both nostrils 2 (two) times daily as needed (for nose bleeds). Reported on 12/23/2015   potassium chloride 10 MEQ tablet Commonly known as:  K-DUR Take 1 tablet (10 mEq total) by mouth daily.   ranitidine 150 MG tablet Commonly known as:  ZANTAC Take 150 mg by mouth daily.   REFRESH 1 % ophthalmic solution Generic drug:  carboxymethylcellulose Place 1 drop into both eyes 3 (three) times daily.   sertraline 25 MG tablet Commonly known as:  ZOLOFT Take 25 mg by mouth daily.       Major procedures and Radiology Reports - PLEASE review detailed and final reports for all details, in brief -      Dg Chest 2 View  Result Date: 02/16/2017 CLINICAL DATA:   Cough and confusion EXAM: CHEST  2 VIEW COMPARISON:  01/15/2017 FINDINGS: Lungs are very under aerated with bibasilar atelectasis. Double lead left subclavian AICD device and leads are stable and intact. No pneumothorax. No pleural effusion. Mild cardiomegaly is stable. IMPRESSION: Bibasilar atelectasis. Electronically Signed   By: Marybelle Killings M.D.   On: 02/16/2017 13:48   Dg Wrist 2 Views Left  Result Date: 02/16/2017 CLINICAL DATA:  Radial side wrist pain worsening over the last few days. No known injury. EXAM: LEFT WRIST - 2 VIEW COMPARISON:  None. FINDINGS: There is mild joint space narrowing at the radiocarpal joint that could go along with mild osteoarthritis. No inter carpal degenerative changes. Mild osteoarthritis at the first carpal/metacarpal joint. No evidence of fracture or other focal finding. IMPRESSION: Mild osteoarthritis at the radiocarpal joint and first carpal/metacarpal joint. No advanced finding. Electronically Signed   By: Nelson Chimes M.D.   On: 02/16/2017 16:38   Ct Head Code Stroke W/o Cm  Result Date: 02/16/2017 CLINICAL DATA:  Code stroke. Acute presentation with left-sided weakness, slurred speech. EXAM: CT HEAD WITHOUT CONTRAST TECHNIQUE: Contiguous axial images were obtained from the base of the skull through the vertex without intravenous contrast. COMPARISON:  02/28/2016 FINDINGS: Brain: Normal appearance for age. Mild volume loss. No evidence of old or acute infarction, mass lesion, hemorrhage, hydrocephalus or extra-axial collection. Vascular: There is atherosclerotic calcification of the major vessels at the base of the brain. Skull: Negative Sinuses/Orbits: Mild mucosal disease in the frontal and ethmoid regions. Orbits negative. Other: None ASPECTS ([place] Stroke Program Early CT Score) - Ganglionic level infarction (caudate, lentiform nuclei, internal capsule, insula, M1-M3 cortex): 7 - Supraganglionic infarction (M4-M6 cortex): 3 Total score (0-10 with 10 being  normal): 10 IMPRESSION: 1. No acute finding.  Normal appearance of the brain for age. 2. ASPECTS is 10. Page placed at  the time of interpretation on 02/16/2017 at 12:25 pm to Dr. Patrcia Dolly . Electronically Signed   By: Nelson Chimes M.D.   On: 02/16/2017 12:28    Micro Results     No results found for this or any previous visit (from the past 240 hour(s)).     Today   Subjective    Tramaine Snell today is back to baseline, axox3.  no headache,no chest abdominal pain,no new weakness tingling or numbness, feels much better wants to go home today.    Objective   Blood pressure 137/62, pulse 72, temperature 98.6 F (37 C), resp. rate 20, height 5\' 6"  (1.676 m), weight 91.2 kg (201 lb 1 oz), SpO2 98 %.   Intake/Output Summary (Last 24 hours) at 02/17/17 0816 Last data filed at 02/17/17 0612  Gross per 24 hour  Intake             1240 ml  Output                1 ml  Net             1239 ml    Exam Awake Alert, Oriented x 3, No new F.N deficits, Normal affect Miranda.AT,PERRAL Supple Neck,No JVD, No cervical lymphadenopathy appriciated.  Symmetrical Chest wall movement, Good air movement bilaterally, CTAB RRR,No Gallops,Rubs or new Murmurs, No Parasternal Heave +ve B.Sounds, Abd Soft, Non tender, No organomegaly appriciated, No rebound -guarding or rigidity. No Cyanosis, Clubbing.  Slight swelling of the left upper ext, which the pt describes as old.    No new Rash or bruise No pronator drift, motor 5/5 in all 4 ext, cn2-12 intact, reflexes 2+ symmetric, diffuse with downgoing toes bilaterally.    Data Review   CBC w Diff: Lab Results  Component Value Date   WBC 8.3 02/16/2017   HGB 8.5 (L) 02/16/2017   HGB 10.3 (L) 04/15/2009   HCT 25.0 (L) 02/16/2017   HCT 30.0 (L) 04/27/2015   HCT 30.5 (L) 04/15/2009   PLT 331 02/16/2017   PLT 303 04/15/2009   LYMPHOPCT 19 02/16/2017   LYMPHOPCT 40.0 04/15/2009   MONOPCT 8 02/16/2017   MONOPCT 10.1 04/15/2009   EOSPCT 1 02/16/2017    EOSPCT 1.0 04/15/2009   BASOPCT 0 02/16/2017   BASOPCT 0.4 04/15/2009    CMP: Lab Results  Component Value Date   NA 140 02/17/2017   K 4.2 02/17/2017   CL 107 02/17/2017   CO2 22 02/17/2017   BUN 22 (H) 02/17/2017   CREATININE 1.35 (H) 02/17/2017   CREATININE 0.83 04/14/2015   PROT 6.7 02/16/2017   ALBUMIN 3.1 (L) 02/16/2017   BILITOT 0.5 02/16/2017   ALKPHOS 54 02/16/2017   AST 58 (H) 02/16/2017   ALT 26 02/16/2017  .   Total Time in preparing paper work, data evaluation and todays exam - 37 minutes  Jani Gravel M.D on 02/17/2017 at [place]  [phone]

## 2017-02-17 NOTE — Progress Notes (Signed)
Pt. Prepared for D/C to SNF. IV D/C. Skin intact, VSS. Report received by Lynford Citizen at [facility]. Pt. Being transported by [facility].

## 2017-04-22 ENCOUNTER — Emergency Department (HOSPITAL_COMMUNITY): Payer: Medicare HMO

## 2017-04-22 ENCOUNTER — Encounter (HOSPITAL_COMMUNITY): Payer: Self-pay | Admitting: Emergency Medicine

## 2017-04-22 ENCOUNTER — Inpatient Hospital Stay (HOSPITAL_COMMUNITY)
Admission: EM | Admit: 2017-04-22 | Discharge: 2017-04-29 | DRG: 193 | Disposition: A | Discharge status: Skilled Nursing Facility | Payer: Medicare HMO | Attending: Family Medicine | Admitting: Family Medicine

## 2017-04-22 DIAGNOSIS — Y95 Nosocomial condition: Secondary | ICD-10-CM | POA: Diagnosis present

## 2017-04-22 DIAGNOSIS — E119 Type 2 diabetes mellitus without complications: Secondary | ICD-10-CM | POA: Diagnosis not present

## 2017-04-22 DIAGNOSIS — Z961 Presence of intraocular lens: Secondary | ICD-10-CM | POA: Diagnosis present

## 2017-04-22 DIAGNOSIS — F0390 Unspecified dementia without behavioral disturbance: Secondary | ICD-10-CM | POA: Diagnosis present

## 2017-04-22 DIAGNOSIS — Z9841 Cataract extraction status, right eye: Secondary | ICD-10-CM

## 2017-04-22 DIAGNOSIS — E785 Hyperlipidemia, unspecified: Secondary | ICD-10-CM | POA: Diagnosis present

## 2017-04-22 DIAGNOSIS — Z933 Colostomy status: Secondary | ICD-10-CM | POA: Diagnosis not present

## 2017-04-22 DIAGNOSIS — R7989 Other specified abnormal findings of blood chemistry: Secondary | ICD-10-CM

## 2017-04-22 DIAGNOSIS — I5022 Chronic systolic (congestive) heart failure: Secondary | ICD-10-CM | POA: Diagnosis not present

## 2017-04-22 DIAGNOSIS — E039 Hypothyroidism, unspecified: Secondary | ICD-10-CM | POA: Diagnosis present

## 2017-04-22 DIAGNOSIS — J189 Pneumonia, unspecified organism: Secondary | ICD-10-CM

## 2017-04-22 DIAGNOSIS — J9601 Acute respiratory failure with hypoxia: Secondary | ICD-10-CM | POA: Diagnosis present

## 2017-04-22 DIAGNOSIS — Z90710 Acquired absence of both cervix and uterus: Secondary | ICD-10-CM | POA: Diagnosis not present

## 2017-04-22 DIAGNOSIS — D649 Anemia, unspecified: Secondary | ICD-10-CM | POA: Diagnosis present

## 2017-04-22 DIAGNOSIS — Z7982 Long term (current) use of aspirin: Secondary | ICD-10-CM

## 2017-04-22 DIAGNOSIS — I272 Pulmonary hypertension, unspecified: Secondary | ICD-10-CM

## 2017-04-22 DIAGNOSIS — R748 Abnormal levels of other serum enzymes: Secondary | ICD-10-CM | POA: Diagnosis not present

## 2017-04-22 DIAGNOSIS — Z79899 Other long term (current) drug therapy: Secondary | ICD-10-CM

## 2017-04-22 DIAGNOSIS — Z85038 Personal history of other malignant neoplasm of large intestine: Secondary | ICD-10-CM | POA: Diagnosis not present

## 2017-04-22 DIAGNOSIS — J45909 Unspecified asthma, uncomplicated: Secondary | ICD-10-CM | POA: Diagnosis not present

## 2017-04-22 DIAGNOSIS — M25559 Pain in unspecified hip: Secondary | ICD-10-CM

## 2017-04-22 DIAGNOSIS — F0280 Dementia in other diseases classified elsewhere without behavioral disturbance: Secondary | ICD-10-CM | POA: Diagnosis not present

## 2017-04-22 DIAGNOSIS — R509 Fever, unspecified: Secondary | ICD-10-CM

## 2017-04-22 DIAGNOSIS — I129 Hypertensive chronic kidney disease with stage 1 through stage 4 chronic kidney disease, or unspecified chronic kidney disease: Secondary | ICD-10-CM | POA: Diagnosis not present

## 2017-04-22 DIAGNOSIS — J430 Unilateral pulmonary emphysema [MacLeod's syndrome]: Secondary | ICD-10-CM

## 2017-04-22 DIAGNOSIS — Z7951 Long term (current) use of inhaled steroids: Secondary | ICD-10-CM | POA: Diagnosis not present

## 2017-04-22 DIAGNOSIS — N183 Chronic kidney disease, stage 3 unspecified: Secondary | ICD-10-CM | POA: Diagnosis present

## 2017-04-22 DIAGNOSIS — I5042 Chronic combined systolic (congestive) and diastolic (congestive) heart failure: Secondary | ICD-10-CM

## 2017-04-22 DIAGNOSIS — J440 Chronic obstructive pulmonary disease with acute lower respiratory infection: Secondary | ICD-10-CM | POA: Diagnosis present

## 2017-04-22 DIAGNOSIS — Z95810 Presence of automatic (implantable) cardiac defibrillator: Secondary | ICD-10-CM

## 2017-04-22 DIAGNOSIS — Z022 Encounter for examination for admission to residential institution: Secondary | ICD-10-CM | POA: Diagnosis present

## 2017-04-22 DIAGNOSIS — J431 Panlobular emphysema: Secondary | ICD-10-CM | POA: Diagnosis not present

## 2017-04-22 DIAGNOSIS — E1122 Type 2 diabetes mellitus with diabetic chronic kidney disease: Secondary | ICD-10-CM | POA: Diagnosis present

## 2017-04-22 DIAGNOSIS — R778 Other specified abnormalities of plasma proteins: Secondary | ICD-10-CM | POA: Diagnosis present

## 2017-04-22 DIAGNOSIS — I110 Hypertensive heart disease with heart failure: Secondary | ICD-10-CM | POA: Diagnosis not present

## 2017-04-22 DIAGNOSIS — J449 Chronic obstructive pulmonary disease, unspecified: Secondary | ICD-10-CM | POA: Diagnosis not present

## 2017-04-22 DIAGNOSIS — I360 Nonrheumatic tricuspid (valve) stenosis: Secondary | ICD-10-CM | POA: Diagnosis not present

## 2017-04-22 DIAGNOSIS — Z9842 Cataract extraction status, left eye: Secondary | ICD-10-CM | POA: Diagnosis not present

## 2017-04-22 DIAGNOSIS — Z86711 Personal history of pulmonary embolism: Secondary | ICD-10-CM | POA: Diagnosis not present

## 2017-04-22 DIAGNOSIS — J9600 Acute respiratory failure, unspecified whether with hypoxia or hypercapnia: Secondary | ICD-10-CM | POA: Diagnosis present

## 2017-04-22 DIAGNOSIS — I130 Hypertensive heart and chronic kidney disease with heart failure and stage 1 through stage 4 chronic kidney disease, or unspecified chronic kidney disease: Secondary | ICD-10-CM | POA: Diagnosis present

## 2017-04-22 DIAGNOSIS — G309 Alzheimer's disease, unspecified: Secondary | ICD-10-CM | POA: Diagnosis not present

## 2017-04-22 DIAGNOSIS — M25551 Pain in right hip: Secondary | ICD-10-CM

## 2017-04-22 DIAGNOSIS — I248 Other forms of acute ischemic heart disease: Secondary | ICD-10-CM | POA: Diagnosis present

## 2017-04-22 DIAGNOSIS — R0602 Shortness of breath: Secondary | ICD-10-CM

## 2017-04-22 HISTORY — DX: Pneumonia, unspecified organism: J18.9

## 2017-04-22 LAB — CBC WITH DIFFERENTIAL/PLATELET
BASOS ABS: 0 10*3/uL (ref 0.0–0.1)
BASOS PCT: 0 %
EOS PCT: 1 %
Eosinophils Absolute: 0.1 10*3/uL (ref 0.0–0.7)
HEMATOCRIT: 27.4 % — AB (ref 36.0–46.0)
Hemoglobin: 8.5 g/dL — ABNORMAL LOW (ref 12.0–15.0)
LYMPHS PCT: 10 %
Lymphs Abs: 0.9 10*3/uL (ref 0.7–4.0)
MCH: 25.7 pg — ABNORMAL LOW (ref 26.0–34.0)
MCHC: 31 g/dL (ref 30.0–36.0)
MCV: 82.8 fL (ref 78.0–100.0)
MONO ABS: 0.7 10*3/uL (ref 0.1–1.0)
Monocytes Relative: 7 %
NEUTROS PCT: 82 %
Neutro Abs: 7.4 10*3/uL (ref 1.7–7.7)
PLATELETS: 321 10*3/uL (ref 150–400)
RBC: 3.31 MIL/uL — AB (ref 3.87–5.11)
RDW: 16 % — AB (ref 11.5–15.5)
WBC: 9.1 10*3/uL (ref 4.0–10.5)

## 2017-04-22 LAB — BRAIN NATRIURETIC PEPTIDE: B Natriuretic Peptide: 286.1 pg/mL — ABNORMAL HIGH (ref 0.0–100.0)

## 2017-04-22 LAB — I-STAT ARTERIAL BLOOD GAS, ED
Acid-Base Excess: 1 mmol/L (ref 0.0–2.0)
BICARBONATE: 23.9 mmol/L (ref 20.0–28.0)
O2 Saturation: 97 %
PCO2 ART: 31.7 mmHg — AB (ref 32.0–48.0)
PO2 ART: 81 mmHg — AB (ref 83.0–108.0)
Patient temperature: 98.7
TCO2: 25 mmol/L (ref 0–100)
pH, Arterial: 7.486 — ABNORMAL HIGH (ref 7.350–7.450)

## 2017-04-22 LAB — COMPREHENSIVE METABOLIC PANEL
ALBUMIN: 3.6 g/dL (ref 3.5–5.0)
ALT: 72 U/L — ABNORMAL HIGH (ref 14–54)
ANION GAP: 9 (ref 5–15)
AST: 114 U/L — AB (ref 15–41)
Alkaline Phosphatase: 81 U/L (ref 38–126)
BUN: 26 mg/dL — AB (ref 6–20)
CO2: 21 mmol/L — AB (ref 22–32)
Calcium: 9.1 mg/dL (ref 8.9–10.3)
Chloride: 105 mmol/L (ref 101–111)
Creatinine, Ser: 1.26 mg/dL — ABNORMAL HIGH (ref 0.44–1.00)
GFR calc Af Amer: 45 mL/min — ABNORMAL LOW (ref >60)
GFR calc non Af Amer: 39 mL/min — ABNORMAL LOW (ref >60)
GLUCOSE: 95 mg/dL (ref 65–99)
POTASSIUM: 4.1 mmol/L (ref 3.5–5.1)
SODIUM: 135 mmol/L (ref 135–145)
Total Bilirubin: 0.4 mg/dL (ref 0.3–1.2)
Total Protein: 6.8 g/dL (ref 6.5–8.1)

## 2017-04-22 LAB — STREP PNEUMONIAE URINARY ANTIGEN: Strep Pneumo Urinary Antigen: NEGATIVE

## 2017-04-22 LAB — CREATININE, SERUM
Creatinine, Ser: 1.45 mg/dL — ABNORMAL HIGH (ref 0.44–1.00)
GFR calc Af Amer: 38 mL/min — ABNORMAL LOW
GFR calc non Af Amer: 33 mL/min — ABNORMAL LOW

## 2017-04-22 LAB — TROPONIN I: Troponin I: 0.08 ng/mL (ref <0.03)

## 2017-04-22 LAB — MRSA PCR SCREENING: MRSA by PCR: NEGATIVE

## 2017-04-22 LAB — I-STAT CG4 LACTIC ACID, ED: Lactic Acid, Venous: 1.3 mmol/L (ref 0.5–1.9)

## 2017-04-22 MED ORDER — LORATADINE 10 MG PO TABS
10.0000 mg | ORAL_TABLET | Freq: Every day | ORAL | Status: DC
  Administered 2017-04-22 – 2017-04-29 (×8): 10 mg via ORAL
  Filled 2017-04-22 (×8): qty 1

## 2017-04-22 MED ORDER — POLYVINYL ALCOHOL 1.4 % OP SOLN
1.0000 [drp] | Freq: Three times a day (TID) | OPHTHALMIC | Status: DC
Start: 2017-04-22 — End: 2017-04-29
  Administered 2017-04-22 – 2017-04-29 (×21): 1 [drp] via OPHTHALMIC
  Filled 2017-04-22: qty 15

## 2017-04-22 MED ORDER — LISINOPRIL 20 MG PO TABS
20.0000 mg | ORAL_TABLET | Freq: Every day | ORAL | Status: DC
  Administered 2017-04-22: 20 mg via ORAL
  Filled 2017-04-22: qty 1

## 2017-04-22 MED ORDER — LEVOTHYROXINE SODIUM 50 MCG PO TABS
50.0000 ug | ORAL_TABLET | Freq: Every day | ORAL | Status: DC
  Administered 2017-04-23 – 2017-04-29 (×7): 50 ug via ORAL
  Filled 2017-04-22 (×7): qty 1

## 2017-04-22 MED ORDER — ATORVASTATIN CALCIUM 20 MG PO TABS
20.0000 mg | ORAL_TABLET | Freq: Every day | ORAL | Status: DC
  Administered 2017-04-22 – 2017-04-29 (×8): 20 mg via ORAL
  Filled 2017-04-22 (×8): qty 1

## 2017-04-22 MED ORDER — IOPAMIDOL (ISOVUE-370) INJECTION 76%
80.0000 mL | Freq: Once | INTRAVENOUS | Status: AC | PRN
  Administered 2017-04-22: 80 mL via INTRAVENOUS

## 2017-04-22 MED ORDER — VANCOMYCIN HCL 10 G IV SOLR
1500.0000 mg | Freq: Once | INTRAVENOUS | Status: AC
  Administered 2017-04-22: 1500 mg via INTRAVENOUS
  Filled 2017-04-22: qty 1500

## 2017-04-22 MED ORDER — CARBOXYMETHYLCELLULOSE SODIUM 1 % OP SOLN: 1.0000 [drp] | Freq: Three times a day (TID) | OPHTHALMIC | Status: DC

## 2017-04-22 MED ORDER — POTASSIUM CHLORIDE CRYS ER 10 MEQ PO TBCR
10.0000 meq | EXTENDED_RELEASE_TABLET | Freq: Every day | ORAL | Status: DC
  Administered 2017-04-22 – 2017-04-29 (×8): 10 meq via ORAL
  Filled 2017-04-22 (×9): qty 1

## 2017-04-22 MED ORDER — FAMOTIDINE 20 MG PO TABS
20.0000 mg | ORAL_TABLET | Freq: Every day | ORAL | Status: DC
  Administered 2017-04-22 – 2017-04-29 (×8): 20 mg via ORAL
  Filled 2017-04-22 (×8): qty 1

## 2017-04-22 MED ORDER — DEXTROSE 5 % IV SOLN
1.0000 g | Freq: Three times a day (TID) | INTRAVENOUS | Status: DC
  Filled 2017-04-22: qty 1

## 2017-04-22 MED ORDER — BENZONATATE 100 MG PO CAPS
100.0000 mg | ORAL_CAPSULE | Freq: Three times a day (TID) | ORAL | Status: DC | PRN
  Administered 2017-04-23 – 2017-04-26 (×6): 100 mg via ORAL
  Filled 2017-04-22 (×7): qty 1

## 2017-04-22 MED ORDER — FUROSEMIDE 40 MG PO TABS
40.0000 mg | ORAL_TABLET | Freq: Every day | ORAL | Status: DC
  Administered 2017-04-22 – 2017-04-23 (×2): 40 mg via ORAL
  Filled 2017-04-22 (×2): qty 1

## 2017-04-22 MED ORDER — PIPERACILLIN-TAZOBACTAM 3.375 G IVPB 30 MIN
3.3750 g | Freq: Once | INTRAVENOUS | Status: AC
  Administered 2017-04-22: 3.375 g via INTRAVENOUS
  Filled 2017-04-22: qty 50

## 2017-04-22 MED ORDER — MOMETASONE FURO-FORMOTEROL FUM 200-5 MCG/ACT IN AERO
2.0000 | INHALATION_SPRAY | Freq: Two times a day (BID) | RESPIRATORY_TRACT | Status: DC
  Administered 2017-04-23 – 2017-04-29 (×11): 2 via RESPIRATORY_TRACT
  Filled 2017-04-22 (×2): qty 8.8

## 2017-04-22 MED ORDER — HEPARIN SODIUM (PORCINE) 5000 UNIT/ML IJ SOLN
5000.0000 [IU] | Freq: Three times a day (TID) | INTRAMUSCULAR | Status: DC
  Administered 2017-04-22 – 2017-04-29 (×17): 5000 [IU] via SUBCUTANEOUS
  Filled 2017-04-22 (×16): qty 1

## 2017-04-22 MED ORDER — ISOSORBIDE MONONITRATE ER 30 MG PO TB24
30.0000 mg | ORAL_TABLET | Freq: Every day | ORAL | Status: DC
  Administered 2017-04-22: 30 mg via ORAL
  Filled 2017-04-22: qty 1

## 2017-04-22 MED ORDER — LISINOPRIL 10 MG PO TABS
10.0000 mg | ORAL_TABLET | Freq: Every day | ORAL | Status: DC
  Administered 2017-04-23 – 2017-04-29 (×7): 10 mg via ORAL
  Filled 2017-04-22 (×7): qty 1

## 2017-04-22 MED ORDER — VANCOMYCIN HCL 10 G IV SOLR
1250.0000 mg | INTRAVENOUS | Status: DC
  Administered 2017-04-23: 1250 mg via INTRAVENOUS
  Filled 2017-04-22: qty 1250

## 2017-04-22 MED ORDER — FLORANEX PO PACK
1.0000 g | PACK | Freq: Three times a day (TID) | ORAL | Status: DC
  Administered 2017-04-22 – 2017-04-29 (×18): 1 g via ORAL
  Filled 2017-04-22 (×24): qty 1

## 2017-04-22 MED ORDER — ASPIRIN EC 81 MG PO TBEC
81.0000 mg | DELAYED_RELEASE_TABLET | Freq: Every day | ORAL | Status: DC
  Administered 2017-04-22 – 2017-04-29 (×8): 81 mg via ORAL
  Filled 2017-04-22 (×8): qty 1

## 2017-04-22 MED ORDER — ALBUTEROL SULFATE (2.5 MG/3ML) 0.083% IN NEBU
5.0000 mg | INHALATION_SOLUTION | Freq: Once | RESPIRATORY_TRACT | Status: AC
  Administered 2017-04-22: 5 mg via RESPIRATORY_TRACT
  Filled 2017-04-22: qty 6

## 2017-04-22 MED ORDER — ALBUTEROL SULFATE (2.5 MG/3ML) 0.083% IN NEBU
2.5000 mg | INHALATION_SOLUTION | RESPIRATORY_TRACT | Status: DC | PRN
  Administered 2017-04-23: 2.5 mg via RESPIRATORY_TRACT
  Filled 2017-04-22 (×2): qty 3

## 2017-04-22 MED ORDER — HYDRALAZINE HCL 25 MG PO TABS
12.5000 mg | ORAL_TABLET | Freq: Three times a day (TID) | ORAL | Status: DC
  Administered 2017-04-22 – 2017-04-29 (×20): 12.5 mg via ORAL
  Filled 2017-04-22 (×20): qty 1

## 2017-04-22 MED ORDER — CARVEDILOL 3.125 MG PO TABS
3.1250 mg | ORAL_TABLET | Freq: Two times a day (BID) | ORAL | Status: DC
  Administered 2017-04-22 – 2017-04-29 (×14): 3.125 mg via ORAL
  Filled 2017-04-22 (×14): qty 1

## 2017-04-22 MED ORDER — DEXTROSE 5 % IV SOLN
2.0000 g | INTRAVENOUS | Status: DC
  Administered 2017-04-22: 2 g via INTRAVENOUS
  Filled 2017-04-22 (×2): qty 2

## 2017-04-22 MED ORDER — POLYSACCHARIDE IRON COMPLEX 150 MG PO CAPS
150.0000 mg | ORAL_CAPSULE | Freq: Two times a day (BID) | ORAL | Status: DC
  Administered 2017-04-22 – 2017-04-29 (×13): 150 mg via ORAL
  Filled 2017-04-22 (×14): qty 1

## 2017-04-22 NOTE — ED Notes (Signed)
CRITICAL VALUE ALERT  Critical Value:  Trop 0.08  Date & Time Notied:  04/22/2017  Provider Notified: Venora Maples

## 2017-04-22 NOTE — Progress Notes (Signed)
PROGRESS NOTE    [name]  KAJ:[id number] DOB: Dec 02, 1933 DOA: 04/22/2017 PCP: Reymundo Poll, MD   Brief Narrative:  83 y.o. BF PMHx Chronic Systolic CHF (EF 03-55%) combination internal cardiac defibrillator (ICD) and pacemaker, COPD,Asthma, PE, Colon cancer,Colostomy , HLD, Hypothyroidism   Presents w progressively worsening dyspnea today.  Pox 70% when EMS arrived,  Placed on non-rebreather and then cpap which brought O2 to 96%.  Pt was transported to ED for evaluation .   In ED,  CTA chest => negative for PE, but showed multifocal pneumonia and small left pleural effusion. Wbc 9.1,  BUn 26/creatinine 1.26  Trop 0.08;   Pt will be admitted for acute hypoxic respiratory failure    Subjective: 7/15  A/O 4, positive acute respiratory distress, negative CP negative N/V. States her base weight~191 pounds~87 kg. States assisted living facility does not weigh her every day   Assessment & Plan:   Active Problems:   Dementia   CKD (chronic kidney disease) stage 3, GFR 30-59 ml/min   Acute respiratory failure (HCC)   Elevated troponin   Pneumonia   HCAP (healthcare-associated pneumonia)   Acute respiratory failure with hypoxia/HCAP -Secondary to multifocal pneumonia, HCAP Urine strep antigen, urine legionella antigen Blood culture x 2 Sputum culture vanco iv , cefepime iv pharmacy to dose Bipap  Troponin elevatioin -Likely due to demand ischemia -Trop I q6h x3 -Echocardiogram pending   Chronic Systolic and Diastolic CHF  -Echocardiogram 11/2015 LVEF 35-40% , Mild AR, Moderate MS, mild-mod MR -Coreg 3.125 mg BID -Lasix 40 mg daily -Hydralazine 12.5 mg TID -Increasing creatinine decrease lisinopril 10 mg daily -DC Imdur  -Strict I&O -Daily weight Filed Weights   04/22/17 0016  Weight: 201 lb (91.2 kg)    Pulmonary hypertension -See CHF  Anemia Repeat cbc in am  CKD stage 3 (baseline Cr~1.2) Lab Results  Component Value Date   CREATININE 1.45 (H)  04/22/2017   CREATININE 1.26 (H) 04/22/2017   CREATININE 1.35 (H) 02/17/2017  -See CHF   Hypothyroidism Cont levothyroxine  COPD -Dulera for Advair      DVT prophylaxis: Subcutaneous heparin Code Status: Full Family Communication: None Disposition Plan: TBD   Consultants:  None Procedures/Significant Events:     VENTILATOR SETTINGS:    Cultures 7/15 blood pending 7/15 sputum pending 7/15 MRSA by PCR negative 7/15 urine legionella/strep pneumo pending    Antimicrobials: Anti-infectives    Start     Stop   04/23/17 0500  vancomycin (VANCOCIN) 1,250 mg in sodium chloride 0.9 % 250 mL IVPB     04/29/17 2359   04/22/17 1200  ceFEPIme (MAXIPIME) 2 g in dextrose 5 % 50 mL IVPB         04/22/17 0615  ceFEPIme (MAXIPIME) 1 g in dextrose 5 % 50 mL IVPB  Status:  Discontinued     04/22/17 0612   04/22/17 0430  vancomycin (VANCOCIN) 1,500 mg in sodium chloride 0.9 % 500 mL IVPB     04/22/17 0807   04/22/17 0415  piperacillin-tazobactam (ZOSYN) IVPB 3.375 g     04/22/17 9741       Devices    LINES / TUBES:      Continuous Infusions: . ceFEPime (MAXIPIME) IV    . [START ON 04/23/2017] vancomycin       Objective: Vitals:   04/22/17 1145 04/22/17 1200 04/22/17 1215 04/22/17 1515  BP: (!) 114/50 (!) 109/52 (!) 112/53 (!) 130/56  Pulse: 90 89 91 96  Resp: (!) 24 (!)  24 (!) 24 (!) 26  Temp:      TempSrc:      SpO2: 100% 100% 100% 99%  Weight:      Height:        Intake/Output Summary (Last 24 hours) at 04/22/17 1712 Last data filed at 04/22/17 9470  Gross per 24 hour  Intake              550 ml  Output                0 ml  Net              550 ml   Filed Weights   04/22/17 0016  Weight: 201 lb (91.2 kg)    Examination:  General: A/O 4, positive acute respiratory distress Eyes: negative scleral hemorrhage, negative anisocoria, negative icterus ENT: Negative Runny nose, negative gingival bleeding, Neck:  Negative scars, masses,  torticollis, lymphadenopathy, JVD Lungs: tachypnea positive bibasilar crackles, positive mild expiratory wheezing diffuse.  Cardiovascular: Regular rate and rhythm without murmur gallop or rub normal S1 and S2 Abdomen: negative abdominal pain, nondistended, positive soft, bowel sounds, no rebound, no ascites, no appreciable mass Extremities: No significant cyanosis, clubbing. Positive bilateral lower extremity edema 2+ Skin: Negative rashes, lesions, ulcers Psychiatric:  Negative depression, negative anxiety, negative fatigue, negative mania  Central nervous system:  Cranial nerves II through XII intact, tongue/uvula midline, all extremities muscle strength 5/5, sensation intact throughout, negative dysarthria, negative expressive aphasia, negative receptive aphasia.  .     Data Reviewed: Care during the described time interval was provided by me .  I have reviewed this patient's available data, including medical history, events of note, physical examination, and all test results as part of my evaluation. I have personally reviewed and interpreted all radiology studies.  CBC:  Recent Labs Lab 04/22/17 0059  WBC 9.1  NEUTROABS 7.4  HGB 8.5*  HCT 27.4*  MCV 82.8  PLT 962   Basic Metabolic Panel:  Recent Labs Lab 04/22/17 0059  NA 135  K 4.1  CL 105  CO2 21*  GLUCOSE 95  BUN 26*  CREATININE 1.26*  CALCIUM 9.1   GFR: Estimated Creatinine Clearance: 39.1 mL/min (A) (by C-G formula based on SCr of 1.26 mg/dL (H)). Liver Function Tests:  Recent Labs Lab 04/22/17 0059  AST 114*  ALT 72*  ALKPHOS 81  BILITOT 0.4  PROT 6.8  ALBUMIN 3.6   No results for input(s): LIPASE, AMYLASE in the last 168 hours. No results for input(s): AMMONIA in the last 168 hours. Coagulation Profile: No results for input(s): INR, PROTIME in the last 168 hours. Cardiac Enzymes:  Recent Labs Lab 04/22/17 0059  TROPONINI 0.08*   BNP (last 3 results) No results for input(s): PROBNP in the  last 8760 hours. HbA1C: No results for input(s): HGBA1C in the last 72 hours. CBG: No results for input(s): GLUCAP in the last 168 hours. Lipid Profile: No results for input(s): CHOL, HDL, LDLCALC, TRIG, CHOLHDL, LDLDIRECT in the last 72 hours. Thyroid Function Tests: No results for input(s): TSH, T4TOTAL, FREET4, T3FREE, THYROIDAB in the last 72 hours. Anemia Panel: No results for input(s): VITAMINB12, FOLATE, FERRITIN, TIBC, IRON, RETICCTPCT in the last 72 hours. Urine analysis:    Component Value Date/Time   COLORURINE YELLOW 02/16/2017 [place] 02/16/2017 1227   LABSPEC 1.017 02/16/2017 1227   PHURINE 5.0 02/16/2017 1227   GLUCOSEU NEGATIVE 02/16/2017 1227   HGBUR NEGATIVE 02/16/2017 1227   BILIRUBINUR  NEGATIVE 02/16/2017 1227   KETONESUR NEGATIVE 02/16/2017 1227   PROTEINUR 30 (A) 02/16/2017 1227   UROBILINOGEN 0.2 07/23/2015 1610   NITRITE NEGATIVE 02/16/2017 1227   LEUKOCYTESUR NEGATIVE 02/16/2017 1227   Sepsis Labs: @LABRCNTIP (procalcitonin:4,lacticidven:4)  )No results found for this or any previous visit (from the past 240 hour(s)).       Radiology Studies: Ct Angio Chest Pe W And/or Wo Contrast  Result Date: 04/22/2017 CLINICAL DATA:  Shortness of breath and hypoxia EXAM: CT ANGIOGRAPHY CHEST WITH CONTRAST TECHNIQUE: Multidetector CT imaging of the chest was performed using the standard protocol during bolus administration of intravenous contrast. Multiplanar CT image reconstructions and MIPs were obtained to evaluate the vascular anatomy. CONTRAST:  80 mL Isovue 370 intravenous COMPARISON:  Chest x-ray 04/22/2017, CT chest 07/26/2015 FINDINGS: Cardiovascular: Satisfactory opacification of the pulmonary arteries to the segmental level. No evidence of pulmonary embolism. Mild aneurysmal dilatation of the ascending aorta up to 4.1 cm. Generous appearing pulmonary artery trunk. Aortic atherosclerosis. Intracardiac pacing leads. Cardiomegaly. No large  pericardial effusion. Mediastinum/Nodes: Midline trachea. No suspicious thyroid mass. Nonspecific mediastinal adenopathy with lymph nodes measuring up to 1 cm. Esophagus within normal limits. Lungs/Pleura: Small left-sided pleural effusion. Multifocal ground-glass densities and nodular infiltrates within the upper and lower lobes and right middle lobe. Upper Abdomen: No acute abnormality. Musculoskeletal: Degenerative changes. No acute or suspicious bone lesion. Review of the MIP images confirms the above findings. IMPRESSION: 1. Negative for acute pulmonary embolus. 2. Cardiomegaly. 3. Small left pleural effusion. Multifocal ground-glass densities and consolidation suspicious for multifocal pneumonia. 4. Mild aneurysmal dilatation of ascending aorta up to 4.1 cm. Recommend annual imaging followup by CTA or MRA. This recommendation follows 2010 ACCF/AHA/AATS/ACR/ASA/SCA/SCAI/SIR/STS/SVM Guidelines for the Diagnosis and Management of Patients with Thoracic Aortic Disease. Circulation. 2010; 121: X833-A250 Aortic aneurysm NOS (ICD10-I71.9). Aortic Atherosclerosis (ICD10-170.0) Electronically Signed   By: Donavan Foil M.D.   On: 04/22/2017 03:51   Dg Chest Portable 1 View  Result Date: 04/22/2017 CLINICAL DATA:  Shortness of breath, hypoxia. EXAM: PORTABLE CHEST 1 VIEW COMPARISON:  Chest radiograph Feb 16, 2017 and priors FINDINGS: Cardiac silhouette is mildly enlarged, mediastinal silhouette is unremarkable for this low inspiratory examination with crowded vasculature markings. The lungs are clear without pleural effusions or focal consolidations. Similar bronchitic changes. Bibasilar strandy densities. Trachea projects midline and there is no pneumothorax. Dual lead LEFT AICD in situ. Included soft tissue planes and osseous structures are non-suspicious. Osteopenia. IMPRESSION: Chronic bronchitic changes and bibasilar atelectasis. Mild cardiomegaly Electronically Signed   By: Elon Alas M.D.   On:  04/22/2017 01:09        Scheduled Meds: . aspirin EC  81 mg Oral Daily  . atorvastatin  20 mg Oral Daily  . carvedilol  3.125 mg Oral BID WC  . famotidine  20 mg Oral Daily  . furosemide  40 mg Oral Daily  . heparin  5,000 Units Subcutaneous Q8H  . hydrALAZINE  12.5 mg Oral TID  . iron polysaccharides  150 mg Oral BID  . isosorbide mononitrate  30 mg Oral Daily  . lactobacillus  1 g Oral TID WC  . [START ON 04/23/2017] levothyroxine  50 mcg Oral QAC breakfast  . lisinopril  20 mg Oral Daily  . loratadine  10 mg Oral Daily  . mometasone-formoterol  2 puff Inhalation BID  . polyvinyl alcohol  1 drop Both Eyes TID  . potassium chloride  10 mEq Oral Daily   Continuous Infusions: . ceFEPime (MAXIPIME) IV    . [  START ON 04/23/2017] vancomycin       LOS: 0 days    Time spent: 40 minutes    Wayland Baik, Geraldo Docker, MD [facility] Pager [phone]   If 7PM-7AM, please contact night-coverage [email or web address] Password [facility] 04/22/2017, 5:12 PM

## 2017-04-22 NOTE — H&P (Signed)
TRH H&P   Patient Demographics:    [name], is a 83 y.o. female  MRN: [id number]   DOB - 15-Sep-1934  Admit Date - 04/22/2017  Outpatient Primary MD for the patient is Reymundo Poll, MD  Referring MD/NP/PA:  Jola Schmidt  Outpatient Specialists:   Patient coming from: [facility]  Chief Complaint  Patient presents with  . Shortness of Breath      HPI:    [name]  is a 83 y.o. female, w CHF (EF 35-40%), Copd, PE,  presents w progressively worsening dyspnea today.  Pox 70% when EMS arrived,  Placed on non-rebreather and then cpap which brought O2 to 96%.  Pt was transported to ED for evaluation .   In ED,  CTA chest => negative for PE, but showed multifocal pneumonia and small left pleural effusion. Wbc 9.1,  BUn 26/creatinine 1.26  Trop 0.08;   Pt will be admitted for acute hypoxic respiratory failure     Review of systems:    In addition to the HPI above,  No Fever-chills, No Headache, No changes with Vision or hearing, No problems swallowing food or Liquids, No Chest pain,  + cough yellow sputumk No Abdominal pain, No Nausea or Vommitting, Bowel movements are regular, No Blood in stool or Urine, No dysuria, No new skin rashes or bruises, No new joints pains-aches,  No new weakness, tingling, numbness in any extremity, No recent weight gain or loss, No polyuria, polydypsia or polyphagia, No significant Mental Stressors.  A full 10 point Review of Systems was done, except as stated above, all other Review of Systems were negative.   With Past History of the following :    Past Medical History:  Diagnosis Date  . Anginal pain ([place])   . Arthritis    "comes and goes" (03/01/2015)  . Asthma   . CHF (congestive heart failure) ([place])   . Colon cancer ([place])   . Colostomy care ([place])   . COPD (chronic obstructive pulmonary disease) ([place])   . Diabetes  mellitus without complication ([place])    pt denies this hx on 03/01/2015  . Heart murmur   . History of blood transfusion    "related to anemia, this is my 1st" (03/01/2015)  . Hyperlipidemia   . Hypertension   . Hypothyroidism   . Pneumonia    "just once" (03/01/2015)  . Presence of combination internal cardiac defibrillator (ICD) and pacemaker    2005 in [facility]  . Pulmonary embolism ([place])   . Renal disorder       Past Surgical History:  Procedure Laterality Date  . ABDOMINAL HYSTERECTOMY    . ANKLE FRACTURE SURGERY Left   . CARDIAC CATHETERIZATION N/A 03/22/2015   Procedure: Right/Left Heart Cath and Coronary Angiography;  Surgeon: Leonie Man, MD;  Location: [facility];  Service: Cardiovascular;  Laterality:  N/A;  . CATARACT EXTRACTION W/ INTRAOCULAR LENS  IMPLANT, BILATERAL Bilateral   . CHOLECYSTECTOMY    . COLON SURGERY     cancer  . DILATION AND CURETTAGE OF UTERUS    . FRACTURE SURGERY    . TUBAL LIGATION        Social History:     Social History  Substance Use Topics  . Smoking status: Never Smoker  . Smokeless tobacco: Never Used  . Alcohol use No     Lives - at [facility] -    Family History :     Family History  Problem Relation Age of Onset  . CAD Unknown   . Hypertension Unknown   . Stroke Unknown   . Colon cancer Neg Hx   . GI Bleed Neg Hx       Home Medications:   Prior to Admission medications   Medication Sig Start Date End Date Taking? Authorizing Provider  acetaminophen (TYLENOL) 325 MG tablet Take 650 mg by mouth every 6 (six) hours as needed (pain).   Yes [provider]  albuterol (PROVENTIL) (2.5 MG/3ML) 0.083% nebulizer solution Take 3 mLs (2.5 mg total) by nebulization every 4 (four) hours as needed for wheezing. 04/28/15  Yes Geradine Girt, DO  aspirin EC 81 MG tablet Take 1 tablet (81 mg total) by mouth every morning. Patient taking differently: Take 81 mg by mouth daily.  03/03/15  Yes Rai,  Ripudeep K, MD  atorvastatin (LIPITOR) 20 MG tablet Take 20 mg by mouth daily.    Yes [provider]  benzonatate (TESSALON) 100 MG capsule Take 1 capsule (100 mg total) by mouth 3 (three) times daily as needed for cough. 01/15/17  Yes Horton, Barbette Hair, MD  carboxymethylcellulose (REFRESH) 1 % ophthalmic solution Place 1 drop into both eyes 3 (three) times daily.   Yes [provider]  carvedilol (COREG) 3.125 MG tablet Take 1 tablet (3.125 mg total) by mouth 2 (two) times daily with a meal. 02/04/16  Yes Bonnell Public, MD  Fluticasone-Salmeterol (ADVAIR) 500-50 MCG/DOSE AEPB Inhale 1 puff into the lungs 2 (two) times daily. Rinse mouth after use   Yes [provider]  furosemide (LASIX) 20 MG tablet Take 40 mg by mouth daily as needed (weight gain or 3 lbs in 24 hours or 5 lbs in 1 week).   Yes [provider]  furosemide (LASIX) 40 MG tablet Take 1 tablet (40 mg total) by mouth daily. 12/30/15  Yes Shirley Friar, PA-C  hydrALAZINE (APRESOLINE) 25 MG tablet Take 0.5 tablets (12.5 mg total) by mouth 3 (three) times daily. 12/23/15  Yes Clegg, Amy D, NP  HYDROcodone-acetaminophen (NORCO/VICODIN) 5-325 MG tablet Take 1 tablet by mouth every 4 (four) hours as needed (pain).    Yes [provider]  iron polysaccharides (FERREX 150) 150 MG capsule Take 150 mg by mouth 2 (two) times daily.   Yes [provider]  isosorbide mononitrate (IMDUR) 30 MG 24 hr tablet Take 1 tablet (30 mg total) by mouth daily. 03/24/15  Yes Dhungel, Nishant, MD  Lactobacillus (FLORANEX PO) Take 1 tablet by mouth 3 (three) times daily with meals.   Yes [provider]  levothyroxine (SYNTHROID, LEVOTHROID) 50 MCG tablet Take 50 mcg by mouth daily.  01/16/15  Yes [provider]  lisinopril (PRINIVIL,ZESTRIL) 20 MG tablet Take 20 mg by mouth daily.   Yes [provider]  loratadine (CLARITIN) 10 MG tablet Take 10  mg by mouth daily.   Yes  [provider]  oxymetazoline (AFRIN) 0.05 % nasal spray Place 1 spray into both nostrils 2 (two) times daily as needed (for nose bleeds). Reported on 12/23/2015   Yes [provider]  potassium chloride (K-DUR) 10 MEQ tablet Take 1 tablet (10 mEq total) by mouth daily. 10/28/15  Yes Noemi Chapel, MD  ranitidine (ZANTAC) 150 MG tablet Take 150 mg by mouth daily.  01/14/15  Yes [provider]  feeding supplement, ENSURE ENLIVE, (ENSURE ENLIVE) LIQD Take 237 mLs by mouth 2 (two) times daily between meals. Patient not taking: Reported on 02/16/2017 03/02/15   Mendel Corning, MD     Allergies:    No Known Allergies   Physical Exam:   Vitals  Blood pressure (!) 108/53, pulse 91, temperature (!) 97.2 F (36.2 C), temperature source Temporal, resp. rate 19, height 5\' 5"  (1.651 m), weight 91.2 kg (201 lb), SpO2 100 %.   1. General  lying in bed in NAD,    2. Normal affect and insight, Not Suicidal or Homicidal, Awake Alert, Oriented X 3.  3. No F.N deficits, ALL C.Nerves Intact, Strength 5/5 all 4 extremities, Sensation intact all 4 extremities, Plantars down going.  4. Ears and Eyes appear Normal, Conjunctivae clear, PERRLA. Moist Oral Mucosa.  5. Supple Neck, No JVD, No cervical lymphadenopathy appriciated, No Carotid Bruits.  6. Symmetrical Chest wall movement, Good air movement bilaterally, crackles right lung base, > left lung base, no wheezing  7. RRR, s1 s2  2/6 sem rusb/apex No Parasternal Heave.  8. Positive Bowel Sounds, Abdomen Soft, No tenderness, No organomegaly appriciated,No rebound -guarding or rigidity.  9.  No Cyanosis, Normal Skin Turgor, No Skin Rash or Bruise.  10. Good muscle tone,  joints appear normal , no effusions, Normal ROM.  11. No Palpable Lymph Nodes in Neck or Axillae     Data Review:    CBC  Recent Labs Lab 04/22/17 0059  WBC 9.1  HGB 8.5*  HCT 27.4*  PLT 321  MCV 82.8  MCH 25.7*  MCHC 31.0  RDW 16.0*    LYMPHSABS 0.9  MONOABS 0.7  EOSABS 0.1  BASOSABS 0.0   ------------------------------------------------------------------------------------------------------------------  Chemistries   Recent Labs Lab 04/22/17 0059  NA 135  K 4.1  CL 105  CO2 21*  GLUCOSE 95  BUN 26*  CREATININE 1.26*  CALCIUM 9.1  AST 114*  ALT 72*  ALKPHOS 81  BILITOT 0.4   ------------------------------------------------------------------------------------------------------------------ estimated creatinine clearance is 39.1 mL/min (A) (by C-G formula based on SCr of 1.26 mg/dL (H)). ------------------------------------------------------------------------------------------------------------------ No results for input(s): TSH, T4TOTAL, T3FREE, THYROIDAB in the last 72 hours.  Invalid input(s): FREET3  Coagulation profile No results for input(s): INR, PROTIME in the last 168 hours. ------------------------------------------------------------------------------------------------------------------- No results for input(s): DDIMER in the last 72 hours. -------------------------------------------------------------------------------------------------------------------  Cardiac Enzymes  Recent Labs Lab 04/22/17 0059  TROPONINI 0.08*   ------------------------------------------------------------------------------------------------------------------    Component Value Date/Time   BNP 286.1 (H) 04/22/2017 0059     ---------------------------------------------------------------------------------------------------------------  Urinalysis    Component Value Date/Time   COLORURINE YELLOW 02/16/2017 1227   APPEARANCEUR CLEAR 02/16/2017 1227   LABSPEC 1.017 02/16/2017 1227   PHURINE 5.0 02/16/2017 1227   GLUCOSEU NEGATIVE 02/16/2017 1227   [place] 02/16/2017 [place] 02/16/2017 1227   KETONESUR NEGATIVE 02/16/2017 1227   PROTEINUR 30 (A) 02/16/2017 1227   UROBILINOGEN 0.2  07/23/2015 1610   NITRITE NEGATIVE 02/16/2017 1227  LEUKOCYTESUR NEGATIVE 02/16/2017 1227    ----------------------------------------------------------------------------------------------------------------   Imaging Results:    Ct Angio Chest Pe W And/or Wo Contrast  Result Date: 04/22/2017 CLINICAL DATA:  Shortness of breath and hypoxia EXAM: CT ANGIOGRAPHY CHEST WITH CONTRAST TECHNIQUE: Multidetector CT imaging of the chest was performed using the standard protocol during bolus administration of intravenous contrast. Multiplanar CT image reconstructions and MIPs were obtained to evaluate the vascular anatomy. CONTRAST:  80 mL Isovue 370 intravenous COMPARISON:  Chest x-ray 04/22/2017, CT chest 07/26/2015 FINDINGS: Cardiovascular: Satisfactory opacification of the pulmonary arteries to the segmental level. No evidence of pulmonary embolism. Mild aneurysmal dilatation of the ascending aorta up to 4.1 cm. Generous appearing pulmonary artery trunk. Aortic atherosclerosis. Intracardiac pacing leads. Cardiomegaly. No large pericardial effusion. Mediastinum/Nodes: Midline trachea. No suspicious thyroid mass. Nonspecific mediastinal adenopathy with lymph nodes measuring up to 1 cm. Esophagus within normal limits. Lungs/Pleura: Small left-sided pleural effusion. Multifocal ground-glass densities and nodular infiltrates within the upper and lower lobes and right middle lobe. Upper Abdomen: No acute abnormality. Musculoskeletal: Degenerative changes. No acute or suspicious bone lesion. Review of the MIP images confirms the above findings. IMPRESSION: 1. Negative for acute pulmonary embolus. 2. Cardiomegaly. 3. Small left pleural effusion. Multifocal ground-glass densities and consolidation suspicious for multifocal pneumonia. 4. Mild aneurysmal dilatation of ascending aorta up to 4.1 cm. Recommend annual imaging followup by CTA or MRA. This recommendation follows 2010 ACCF/AHA/AATS/ACR/ASA/SCA/SCAI/SIR/STS/SVM  Guidelines for the Diagnosis and Management of Patients with Thoracic Aortic Disease. Circulation. 2010; 121: F810-F751 Aortic aneurysm NOS (ICD10-I71.9). Aortic Atherosclerosis (ICD10-170.0) Electronically Signed   By: Donavan Foil M.D.   On: 04/22/2017 03:51   Dg Chest Portable 1 View  Result Date: 04/22/2017 CLINICAL DATA:  Shortness of breath, hypoxia. EXAM: PORTABLE CHEST 1 VIEW COMPARISON:  Chest radiograph Feb 16, 2017 and priors FINDINGS: Cardiac silhouette is mildly enlarged, mediastinal silhouette is unremarkable for this low inspiratory examination with crowded vasculature markings. The lungs are clear without pleural effusions or focal consolidations. Similar bronchitic changes. Bibasilar strandy densities. Trachea projects midline and there is no pneumothorax. Dual lead LEFT AICD in situ. Included soft tissue planes and osseous structures are non-suspicious. Osteopenia. IMPRESSION: Chronic bronchitic changes and bibasilar atelectasis. Mild cardiomegaly Electronically Signed   By: Elon Alas M.D.   On: 04/22/2017 01:09       Assessment & Plan:    Active Problems:   Dementia   CKD (chronic kidney disease) stage 3, GFR 30-59 ml/min   Acute respiratory failure (HCC)   Elevated troponin   Pneumonia    Acute respiratory failure Secondary to multifocal pneumonia, HCAP Urine strep antigen, urine legionella antigen Blood culture x 2 Sputum culture vanco iv , cefepime iv pharmacy to dose Bipap  Troponin elevatioin Likely due to demand ischemia Trop I q6h x3 Check cardiac echo   CHF (EF 35%), Mild AR, Moderate MS, mild-mod MR Continue lasix Cont lisinopril Cont hydralazine Cont carvedilol Cont imdur  Anemia Repeat cbc in am  CKD stage 3, check cmp in am  Hypothyroidism Cont levothyroxine  Copd Cont advair    DVT Prophylaxis Heparin - SCDs   AM Labs Ordered, also please review Full Orders  Family Communication: Admission, patients condition and plan  of care including tests being ordered have been discussed with the patient  who indicate understanding and agree with the plan and Code Status.  Code Status FULL CODE  Likely DC to  [facility]  Condition GUARDED    Consults called:  none  Admission status: inpatient  Time spent in minutes : 45 minutes   Jani Gravel M.D on 04/22/2017 at 5:54 AM  Between 7pm to 7am - Pager - [phone]. After 7am go to [email or web address] - password [facility]  [facility] - Office  [phone]

## 2017-04-22 NOTE — ED Provider Notes (Signed)
[facility] DEPT Provider Note   CSN: [id number] Arrival date & time: 04/22/17  0014 By signing my name below, I, Dyke Brackett, attest that this documentation has been prepared under the direction and in the presence of Jola Schmidt, MD . Electronically Signed: Dyke Brackett, Scribe. 04/22/2017. 12:49 AM.   History   Chief Complaint Chief Complaint  Patient presents with  . Shortness of Breath   LEVEL 5 CAVEAT: HPI AND ROS LIMITED DUE TO RESPIRATORY DISTRESS   HPI [name] is a 83 y.o. female with a history of CHF, COPD, who presents to the Emergency Department from [facility] complaining of progressively worsening shortness of breath onset today. Upon EMS arrival, her SPO2 was 70 percent; pt was was placed on non-rebreather mask and then CPAP which brought SPO2 to 96%. Pt reports increased bilateral leg swelling. She denies any chest pain.   The history is provided by the patient and the EMS personnel. The history is limited by the condition of the patient. No language interpreter was used.    Past Medical History:  Diagnosis Date  . Anginal pain ([place])   . Arthritis    "comes and goes" (03/01/2015)  . Asthma   . CHF (congestive heart failure) ([place])   . Colon cancer ([place])   . Colostomy care ([place])   . COPD (chronic obstructive pulmonary disease) ([place])   . Diabetes mellitus without complication ([place])    pt denies this hx on 03/01/2015  . Heart murmur   . History of blood transfusion    "related to anemia, this is my 1st" (03/01/2015)  . Hyperlipidemia   . Hypertension   . Hypothyroidism   . Pneumonia    "just once" (03/01/2015)  . Presence of combination internal cardiac defibrillator (ICD) and pacemaker    2005 in [facility]  . Pulmonary embolism ([place])   . Renal disorder     Patient Active Problem List   Diagnosis Date Noted  . Altered mental status   . Hypotension 02/16/2017  . Syncope 02/02/2016  . Faintness   . Essential hypertension 11/16/2015  .  Hypothyroidism 11/16/2015  . Acute on chronic systolic (congestive) heart failure ([place]) 11/15/2015  . Hypertensive heart disease 10/04/2015  . Elevated troponin 10/04/2015  . Acute respiratory failure ([place])   . Acute systolic congestive heart failure ([place])   . NICM (nonischemic cardiomyopathy) ([place]) 08/11/2015  . Normal coronary arteries 08/11/2015  . Dyslipidemia 07/27/2015  . CKD (chronic kidney disease) stage 3, GFR 30-59 ml/min 07/27/2015  . Anemia 07/27/2015  . Benign essential HTN 07/27/2015  . Hypothyroidism, adult 07/27/2015  . FUO (fever of unknown origin) 07/27/2015  . IDA (iron deficiency anemia) 07/27/2015  . Chronic systolic CHF (congestive heart failure) ([place]) 07/27/2015  . Colon cancer ([place]) 07/27/2015  . Sepsis ([place]) 07/24/2015  . Acute encephalopathy 07/24/2015  . Hypokalemia 07/24/2015  . Dementia 07/23/2015  . COPD (chronic obstructive pulmonary disease) ([place]) 04/25/2015  . ICD in place 04/14/2015  . Colostomy in place [facility]) 03/01/2015    Past Surgical History:  Procedure Laterality Date  . ABDOMINAL HYSTERECTOMY    . ANKLE FRACTURE SURGERY Left   . CARDIAC CATHETERIZATION N/A 03/22/2015   Procedure: Right/Left Heart Cath and Coronary Angiography;  Surgeon: Leonie Man, MD;  Location: [facility] CV LAB;  Service: Cardiovascular;  Laterality: N/A;  . CATARACT EXTRACTION W/ INTRAOCULAR LENS  IMPLANT, BILATERAL Bilateral   . CHOLECYSTECTOMY    . COLON SURGERY     cancer  .  DILATION AND CURETTAGE OF UTERUS    . FRACTURE SURGERY    . TUBAL LIGATION      OB History    No data available       Home Medications    Prior to Admission medications   Medication Sig Start Date End Date Taking? Authorizing Provider  acetaminophen (TYLENOL) 325 MG tablet Take 650 mg by mouth every 6 (six) hours as needed (pain).    [provider]  albuterol (PROVENTIL) (2.5 MG/3ML) 0.083% nebulizer solution Take 3 mLs (2.5 mg total) by nebulization every 4 (four)  hours as needed for wheezing. 04/28/15   Geradine Girt, DO  aspirin EC 81 MG tablet Take 1 tablet (81 mg total) by mouth every morning. Patient taking differently: Take 81 mg by mouth daily.  03/03/15   Rai, Vernelle Emerald, MD  atorvastatin (LIPITOR) 20 MG tablet Take 20 mg by mouth daily.     [provider]  benzonatate (TESSALON) 100 MG capsule Take 1 capsule (100 mg total) by mouth 3 (three) times daily as needed for cough. 01/15/17   Horton, Barbette Hair, MD  carboxymethylcellulose (REFRESH) 1 % ophthalmic solution Place 1 drop into both eyes 3 (three) times daily.    [provider]  carvedilol (COREG) 3.125 MG tablet Take 1 tablet (3.125 mg total) by mouth 2 (two) times daily with a meal. 02/04/16   Bonnell Public, MD  feeding supplement, ENSURE ENLIVE, (ENSURE ENLIVE) LIQD Take 237 mLs by mouth 2 (two) times daily between meals. Patient not taking: Reported on 02/16/2017 03/02/15   Rai, Vernelle Emerald, MD  Fluticasone-Salmeterol (ADVAIR) 500-50 MCG/DOSE AEPB Inhale 1 puff into the lungs 2 (two) times daily. Rinse mouth after use    [provider]  furosemide (LASIX) 20 MG tablet Take 40 mg by mouth daily as needed (weight gain or 3 lbs in 24 hours or 5 lbs in 1 week).    [provider]  furosemide (LASIX) 40 MG tablet Take 1 tablet (40 mg total) by mouth daily. 12/30/15   Shirley Friar, PA-C  hydrALAZINE (APRESOLINE) 25 MG tablet Take 0.5 tablets (12.5 mg total) by mouth 3 (three) times daily. 12/23/15   Clegg, Amy D, NP  HYDROcodone-acetaminophen (NORCO/VICODIN) 5-325 MG tablet Take 1 tablet by mouth every 4 (four) hours as needed (pain).     [provider]  iron polysaccharides (FERREX 150) 150 MG capsule Take 150 mg by mouth 2 (two) times daily.    [provider]  isosorbide mononitrate (IMDUR) 30 MG 24 hr tablet Take 1 tablet (30 mg total) by mouth daily. 03/24/15   Dhungel, Nishant, MD  Lactobacillus (FLORANEX PO) Take 1 tablet by  mouth 3 (three) times daily with meals.    [provider]  levothyroxine (SYNTHROID, LEVOTHROID) 50 MCG tablet Take 50 mcg by mouth daily.  01/16/15   [provider]  lisinopril (PRINIVIL,ZESTRIL) 20 MG tablet Take 20 mg by mouth daily.    [provider]  oxymetazoline (AFRIN) 0.05 % nasal spray Place 1 spray into both nostrils 2 (two) times daily as needed (for nose bleeds). Reported on 12/23/2015    [provider]  potassium chloride (K-DUR) 10 MEQ tablet Take 1 tablet (10 mEq total) by mouth daily. 10/28/15   Noemi Chapel, MD  ranitidine (ZANTAC) 150 MG tablet Take 150 mg by mouth daily.  01/14/15   [provider]  sertraline (ZOLOFT) 25 MG tablet Take 25 mg by mouth daily.  [provider]    Family History Family History  Problem Relation Age of Onset  . CAD Unknown   . Hypertension Unknown   . Stroke Unknown   . Colon cancer Neg Hx   . GI Bleed Neg Hx     Social History Social History  Substance Use Topics  . Smoking status: Never Smoker  . Smokeless tobacco: Never Used  . Alcohol use No     Allergies   Patient has no known allergies.   Review of Systems Review of Systems  Unable to perform ROS: Severe respiratory distress    Physical Exam Updated Vital Signs There were no vitals taken for this visit.  Physical Exam  Constitutional: She is oriented to person, place, and time. She appears well-developed and well-nourished. No distress.  HENT:  Head: Normocephalic and atraumatic.  Eyes: EOM are normal.  Neck: Normal range of motion.  Cardiovascular: Normal rate, regular rhythm and normal heart sounds.   Pulmonary/Chest: She has rales.  Rales bilateral bases, more prominent on the right  Abdominal: Soft. She exhibits no distension. There is no tenderness.  Musculoskeletal: Normal range of motion. She exhibits edema.  1+ edema BLE  Neurological: She is alert and oriented to person, place, and time.  Skin:  Skin is warm and dry.  Psychiatric: She has a normal mood and affect. Judgment normal.  Nursing note and vitals reviewed.  ED Treatments / Results  DIAGNOSTIC STUDIES:  Oxygen Saturation is 99% on Bi-PAP, normal by my interpretation.    COORDINATION OF CARE:  12:24 AM Will order DG chest, CBC, CMP and troponin. Discussed treatment plan with pt at bedside and pt agreed to plan.   Labs (all labs ordered are listed, but only abnormal results are displayed) Labs Reviewed  BRAIN NATRIURETIC PEPTIDE - Abnormal; Notable for the following:       Result Value   B Natriuretic Peptide 286.1 (*)    All other components within normal limits  CBC WITH DIFFERENTIAL/PLATELET - Abnormal; Notable for the following:    RBC 3.31 (*)    Hemoglobin 8.5 (*)    HCT 27.4 (*)    MCH 25.7 (*)    RDW 16.0 (*)    All other components within normal limits  TROPONIN I - Abnormal; Notable for the following:    Troponin I 0.08 (*)    All other components within normal limits  COMPREHENSIVE METABOLIC PANEL - Abnormal; Notable for the following:    CO2 21 (*)    BUN 26 (*)    Creatinine, Ser 1.26 (*)    AST 114 (*)    ALT 72 (*)    GFR calc non Af Amer 39 (*)    GFR calc Af Amer 45 (*)    All other components within normal limits  I-STAT ARTERIAL BLOOD GAS, ED - Abnormal; Notable for the following:    pH, Arterial 7.486 (*)    pCO2 arterial 31.7 (*)    pO2, Arterial 81.0 (*)    All other components within normal limits    EKG  EKG Interpretation  Date/Time:  Sunday April 22 2017 02:05:14 EDT Ventricular Rate:  73 PR Interval:    QRS Duration: 169 QT Interval:  600 QTC Calculation: 590 R Axis:   102 Text Interpretation:  Sinus rhythm Left bundle branch block Lateral infarct, age indeterminate Probable anteroseptal infarct, recent Confirmed by Jola Schmidt [phone]) on 04/22/2017 2:38:08 AM       Radiology Ct Angio Chest  Pe W And/or Wo Contrast  Result Date: 04/22/2017 CLINICAL DATA:   Shortness of breath and hypoxia EXAM: CT ANGIOGRAPHY CHEST WITH CONTRAST TECHNIQUE: Multidetector CT imaging of the chest was performed using the standard protocol during bolus administration of intravenous contrast. Multiplanar CT image reconstructions and MIPs were obtained to evaluate the vascular anatomy. CONTRAST:  80 mL Isovue 370 intravenous COMPARISON:  Chest x-ray 04/22/2017, CT chest 07/26/2015 FINDINGS: Cardiovascular: Satisfactory opacification of the pulmonary arteries to the segmental level. No evidence of pulmonary embolism. Mild aneurysmal dilatation of the ascending aorta up to 4.1 cm. Generous appearing pulmonary artery trunk. Aortic atherosclerosis. Intracardiac pacing leads. Cardiomegaly. No large pericardial effusion. Mediastinum/Nodes: Midline trachea. No suspicious thyroid mass. Nonspecific mediastinal adenopathy with lymph nodes measuring up to 1 cm. Esophagus within normal limits. Lungs/Pleura: Small left-sided pleural effusion. Multifocal ground-glass densities and nodular infiltrates within the upper and lower lobes and right middle lobe. Upper Abdomen: No acute abnormality. Musculoskeletal: Degenerative changes. No acute or suspicious bone lesion. Review of the MIP images confirms the above findings. IMPRESSION: 1. Negative for acute pulmonary embolus. 2. Cardiomegaly. 3. Small left pleural effusion. Multifocal ground-glass densities and consolidation suspicious for multifocal pneumonia. 4. Mild aneurysmal dilatation of ascending aorta up to 4.1 cm. Recommend annual imaging followup by CTA or MRA. This recommendation follows 2010 ACCF/AHA/AATS/ACR/ASA/SCA/SCAI/SIR/STS/SVM Guidelines for the Diagnosis and Management of Patients with Thoracic Aortic Disease. Circulation. 2010; 121: I502-D741 Aortic aneurysm NOS (ICD10-I71.9). Aortic Atherosclerosis (ICD10-170.0) Electronically Signed   By: Donavan Foil M.D.   On: 04/22/2017 03:51   Dg Chest Portable 1 View  Result Date:  04/22/2017 CLINICAL DATA:  Shortness of breath, hypoxia. EXAM: PORTABLE CHEST 1 VIEW COMPARISON:  Chest radiograph Feb 16, 2017 and priors FINDINGS: Cardiac silhouette is mildly enlarged, mediastinal silhouette is unremarkable for this low inspiratory examination with crowded vasculature markings. The lungs are clear without pleural effusions or focal consolidations. Similar bronchitic changes. Bibasilar strandy densities. Trachea projects midline and there is no pneumothorax. Dual lead LEFT AICD in situ. Included soft tissue planes and osseous structures are non-suspicious. Osteopenia. IMPRESSION: Chronic bronchitic changes and bibasilar atelectasis. Mild cardiomegaly Electronically Signed   By: Elon Alas M.D.   On: 04/22/2017 01:09    Procedures .Critical Care Performed by: Jola Schmidt Authorized by: Jola Schmidt     Total critical care time: 33 minutes Critical care time was exclusive of separately billable procedures and treating other patients. Critical care was necessary to treat or prevent imminent or life-threatening deterioration. Critical care was time spent personally by me on the following activities: development of treatment plan with patient and/or surrogate as well as nursing, discussions with consultants, evaluation of patient's response to treatment, examination of patient, obtaining history from patient or surrogate, ordering and performing treatments and interventions, ordering and review of laboratory studies, ordering and review of radiographic studies, pulse oximetry and re-evaluation of patient's condition.   Medications Ordered in ED Medications  vancomycin (VANCOCIN) 1,250 mg in sodium chloride 0.9 % 250 mL IVPB (not administered)  piperacillin-tazobactam (ZOSYN) IVPB 3.375 g (not administered)  albuterol (PROVENTIL) (2.5 MG/3ML) 0.083% nebulizer solution 5 mg (5 mg Nebulization Given by Other 04/22/17 0034)  iopamidol (ISOVUE-370) 76 % injection 80 mL (80 mLs  Intravenous Contrast Given 04/22/17 0309)     Initial Impression / Assessment and Plan / ED Course  I have reviewed the triage vital signs and the nursing notes.  Pertinent labs & imaging results that were available during my care of the patient were  reviewed by me and considered in my medical decision making (see chart for details).     Acute respiratory failure secondary to PNA. Initial cxr clear. CT without PE but does demonstrate groundglass opacities concerning for developing pneumonia.  Patient be started on Bactrim as Zosyn.  Admit to stepdown.  Still requiring BiPAP at this time  Final Clinical Impressions(s) / ED Diagnoses   Final diagnoses:  Acute respiratory failure with hypoxia ([place])  HCAP (healthcare-associated pneumonia)    New Prescriptions New Prescriptions   No medications on file   I personally performed the services described in this documentation, which was scribed in my presence. The recorded information has been reviewed and is accurate.        Jola Schmidt, MD 04/22/17 [phone]

## 2017-04-22 NOTE — ED Triage Notes (Signed)
Pt brought to ED by [facility] from [facility] for SOB and increase lethargic, SPO2 70% on EMS arrival, placed on NRM and up to 90%, then placed on CPAP up to 96%, HR 90-100, R- 42 BP 163/51.

## 2017-04-22 NOTE — ED Notes (Signed)
Pt tolerating 3L [place]

## 2017-04-22 NOTE — Progress Notes (Signed)
Pharmacy Antibiotic Note  [name] is a 83 y.o. female admitted on 04/22/2017 with pneumonia.  Pharmacy has been consulted for vancomycin dosing.  One time doses of Zosyn and vancomycin given in the ED. CrCl ~51ml/min.  Plan: Change cefepime to 2g IV Q24h Start vancomycin 1,250mg  IV Q24h tomorrow Monitor clinical picture, renal function, VT prn F/U C&S, abx deescalation / LOT   Height: 5\' 5"  (165.1 cm) Weight: 201 lb (91.2 kg) IBW/kg (Calculated) : 57  Temp (24hrs), Avg:97.2 F (36.2 C), Min:97.2 F (36.2 C), Max:97.2 F (36.2 C)   Recent Labs Lab 04/22/17 0059 04/22/17 0559  WBC 9.1  --   CREATININE 1.26*  --   LATICACIDVEN  --  1.30    Estimated Creatinine Clearance: 39.1 mL/min (A) (by C-G formula based on SCr of 1.26 mg/dL (H)).    No Known Allergies  Thank you for allowing pharmacy to be a part of this patient's care.  Reginia Naas 04/22/2017 6:15 AM

## 2017-04-22 NOTE — Progress Notes (Signed)
This note also relates to the following rows which could not be included: SpO2 - Cannot attach notes to unvalidated device data  RT took pt off bipap for a trial. Pt on [place] 3 Lpm, tolerating well at this time.  RT will continue to monitor.

## 2017-04-23 ENCOUNTER — Inpatient Hospital Stay (HOSPITAL_COMMUNITY): Payer: Medicare HMO

## 2017-04-23 ENCOUNTER — Encounter (HOSPITAL_COMMUNITY): Payer: Self-pay | Admitting: General Practice

## 2017-04-23 DIAGNOSIS — I360 Nonrheumatic tricuspid (valve) stenosis: Secondary | ICD-10-CM

## 2017-04-23 DIAGNOSIS — J431 Panlobular emphysema: Secondary | ICD-10-CM

## 2017-04-23 LAB — BASIC METABOLIC PANEL
ANION GAP: 9 (ref 5–15)
BUN: 30 mg/dL — ABNORMAL HIGH (ref 6–20)
CALCIUM: 8.4 mg/dL — AB (ref 8.9–10.3)
CO2: 23 mmol/L (ref 22–32)
Chloride: 106 mmol/L (ref 101–111)
Creatinine, Ser: 1.58 mg/dL — ABNORMAL HIGH (ref 0.44–1.00)
GFR, EST AFRICAN AMERICAN: 34 mL/min — AB (ref >60)
GFR, EST NON AFRICAN AMERICAN: 30 mL/min — AB (ref >60)
Glucose, Bld: 73 mg/dL (ref 65–99)
Potassium: 4.3 mmol/L (ref 3.5–5.1)
Sodium: 138 mmol/L (ref 135–145)

## 2017-04-23 LAB — CBC
HEMATOCRIT: 25.9 % — AB (ref 36.0–46.0)
HEMOGLOBIN: 7.6 g/dL — AB (ref 12.0–15.0)
MCH: 24.6 pg — ABNORMAL LOW (ref 26.0–34.0)
MCHC: 29.3 g/dL — ABNORMAL LOW (ref 30.0–36.0)
MCV: 83.8 fL (ref 78.0–100.0)
Platelets: 333 10*3/uL (ref 150–400)
RBC: 3.09 MIL/uL — ABNORMAL LOW (ref 3.87–5.11)
RDW: 15.8 % — AB (ref 11.5–15.5)
WBC: 13.3 10*3/uL — AB (ref 4.0–10.5)

## 2017-04-23 LAB — EXPECTORATED SPUTUM ASSESSMENT W GRAM STAIN, RFLX TO RESP C

## 2017-04-23 LAB — CBC WITH DIFFERENTIAL/PLATELET
BASOS ABS: 0 10*3/uL (ref 0.0–0.1)
BASOS PCT: 0 %
EOS ABS: 0.1 10*3/uL (ref 0.0–0.7)
Eosinophils Relative: 1 %
HCT: 24.2 % — ABNORMAL LOW (ref 36.0–46.0)
HEMOGLOBIN: 7.2 g/dL — AB (ref 12.0–15.0)
Lymphocytes Relative: 10 %
Lymphs Abs: 1.2 10*3/uL (ref 0.7–4.0)
MCH: 24.9 pg — ABNORMAL LOW (ref 26.0–34.0)
MCHC: 29.8 g/dL — ABNORMAL LOW (ref 30.0–36.0)
MCV: 83.7 fL (ref 78.0–100.0)
Monocytes Absolute: 1 10*3/uL (ref 0.1–1.0)
Monocytes Relative: 8 %
NEUTROS PCT: 81 %
Neutro Abs: 10.2 10*3/uL — ABNORMAL HIGH (ref 1.7–7.7)
Platelets: 285 10*3/uL (ref 150–400)
RBC: 2.89 MIL/uL — ABNORMAL LOW (ref 3.87–5.11)
RDW: 15.7 % — ABNORMAL HIGH (ref 11.5–15.5)
WBC: 12.5 10*3/uL — AB (ref 4.0–10.5)

## 2017-04-23 LAB — PREPARE RBC (CROSSMATCH)

## 2017-04-23 LAB — ECHOCARDIOGRAM COMPLETE
Height: 65 in
WEIGHTICAEL: 3047.64 [oz_av]

## 2017-04-23 LAB — RETICULOCYTES
RBC.: 3.09 MIL/uL — ABNORMAL LOW (ref 3.87–5.11)
RETIC COUNT ABSOLUTE: 27.8 10*3/uL (ref 19.0–186.0)
Retic Ct Pct: 0.9 % (ref 0.4–3.1)

## 2017-04-23 LAB — EXPECTORATED SPUTUM ASSESSMENT W REFEX TO RESP CULTURE: SPECIAL REQUESTS: NORMAL

## 2017-04-23 LAB — FOLATE: FOLATE: 10.9 ng/mL (ref >5.9)

## 2017-04-23 LAB — IRON AND TIBC
IRON: 8 ug/dL — AB (ref 28–170)
SATURATION RATIOS: 3 % — AB (ref 10.4–31.8)
TIBC: 244 ug/dL — ABNORMAL LOW (ref 250–450)
UIBC: 236 ug/dL

## 2017-04-23 LAB — FERRITIN: Ferritin: 68 ng/mL (ref 11–307)

## 2017-04-23 LAB — MAGNESIUM: Magnesium: 1.8 mg/dL (ref 1.7–2.4)

## 2017-04-23 LAB — LACTATE DEHYDROGENASE: LDH: 199 U/L — ABNORMAL HIGH (ref 98–192)

## 2017-04-23 LAB — VITAMIN B12: VITAMIN B 12: 207 pg/mL (ref 180–914)

## 2017-04-23 MED ORDER — GUAIFENESIN-CODEINE 100-10 MG/5ML PO SOLN
10.0000 mL | Freq: Every evening | ORAL | Status: DC | PRN
  Administered 2017-04-24 – 2017-04-29 (×4): 10 mL via ORAL
  Filled 2017-04-23 (×4): qty 10

## 2017-04-23 MED ORDER — HYDROCODONE-ACETAMINOPHEN 5-325 MG PO TABS
1.0000 | ORAL_TABLET | ORAL | Status: DC | PRN
  Administered 2017-04-25 – 2017-04-29 (×5): 1 via ORAL
  Filled 2017-04-23 (×6): qty 1

## 2017-04-23 MED ORDER — DEXTROSE 5 % IV SOLN
1.0000 g | INTRAVENOUS | Status: DC
  Administered 2017-04-23 – 2017-04-26 (×4): 1 g via INTRAVENOUS
  Filled 2017-04-23 (×4): qty 1

## 2017-04-23 MED ORDER — VANCOMYCIN HCL IN DEXTROSE 1-5 GM/200ML-% IV SOLN
1000.0000 mg | INTRAVENOUS | Status: DC
  Administered 2017-04-24: 1000 mg via INTRAVENOUS
  Filled 2017-04-23: qty 200

## 2017-04-23 MED ORDER — SODIUM CHLORIDE 0.9 % IV SOLN
Freq: Once | INTRAVENOUS | Status: AC
  Administered 2017-04-23: 22:00:00 via INTRAVENOUS

## 2017-04-23 MED ORDER — HYDROCODONE-ACETAMINOPHEN 5-325 MG PO TABS
2.0000 | ORAL_TABLET | Freq: Once | ORAL | Status: AC
  Administered 2017-04-23: 2 via ORAL
  Filled 2017-04-23: qty 2

## 2017-04-23 NOTE — Clinical Social Work Note (Signed)
Clinical Social Work Assessment  Patient Details  Name: [name] MRN: [id number] Date of Birth: 1934/05/20  Date of referral:  04/23/17               Reason for consult:  Facility Placement                Permission sought to share information with:  Family Supports, Customer service manager Permission granted to share information::  Yes, Verbal Permission Granted  Name::     [name]  Agency::  [facility]  Relationship::  Son   Contact Information:  [phone]   Housing/Transportation Living arrangements for the past 2 months:  [place] of Information:  Patient, Adult Children Patient Interpreter Needed:  None Criminal Activity/Legal Involvement Pertinent to Current Situation/Hospitalization:  No - Comment as needed Significant Relationships:  Adult Children Lives with:  Facility Resident Do you feel safe going back to the place where you live?  Yes Need for family participation in patient care:  Yes (Comment)  Care giving concerns:  Patient is from [facility].   Social Worker assessment / plan: CSW met with patient at bedside and spoke to patient's son, [name], via phone. Son wishes for patient to return to ALF at discharge. Son is patient's legal guardian. Patient is not evaluated by PT/OT. CSW will support with disposition planning pending PT/OT if needed.  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 Son appreciative of care.  Patient/Family's Understanding of and Emotional Response to Diagnosis, Current Treatment, and Prognosis: Patient's son is legal guardian. Patient appreciative of care and hopeful for  patient's return to ALF.  Emotional Assessment Appearance:  Appears stated age Attitude/Demeanor/Rapport:  Lethargic Affect (typically observed):  Appropriate Orientation:  Oriented to Self, Oriented to Place, Oriented to  Time, Oriented to Situation Alcohol / Substance use:  Not Applicable Psych involvement (Current and /or in the community):  No (Comment)  Discharge Needs  Concerns to be addressed:  Discharge Planning Concerns Readmission within the last 30 days:  No Current discharge risk:  Physical Impairment Barriers to Discharge:  Continued Medical Work up   Shelby Emms, LCSW 04/23/2017, 2:58 PM

## 2017-04-23 NOTE — Progress Notes (Signed)
*  PRELIMINARY RESULTS* Echocardiogram 2D Echocardiogram has been performed.  Leavy Cella 04/23/2017, 4:23 PM

## 2017-04-23 NOTE — Progress Notes (Signed)
Pharmacy Antibiotic Note  [name] is a 83 y.o. female admitted on 04/22/2017 with pneumonia.  Pharmacy has been consulted for Vancomycin and Cefepime dosing.  Cr has been trending up, CrCl ~9ml/mim  Plan: Adjust Vancomycin to 1000mg  IV q24 to complete 8 days therapy, as previously ordered. Change Cefepime to 1g IV q24  Watch renal fxn Vanc trough when appropriate  Height: 5\' 5"  (165.1 cm) Weight: 190 lb 7.6 oz (86.4 kg) IBW/kg (Calculated) : 57  Temp (24hrs), Avg:99.1 F (37.3 C), Min:98.5 F (36.9 C), Max:99.6 F (37.6 C)   Recent Labs Lab 04/22/17 0059 04/22/17 0559 04/22/17 1629 04/23/17 0438  WBC 9.1  --   --  12.5*  CREATININE 1.26*  --  1.45* 1.58*  LATICACIDVEN  --  1.30  --   --     Estimated Creatinine Clearance: 30.3 mL/min (A) (by C-G formula based on SCr of 1.58 mg/dL (H)).    No Known Allergies  Antimicrobials this admission: Vanc 7/15 >> (7/22) Cefepime 7/15 >>  Dose adjustments this admission: 7/16 dosing adjusted for inc Cr  Microbiology results: 7/15  sputum 7/15  blood x 2  Thank you for allowing pharmacy to be a part of this patient's care.  Lewie Chamber., PharmD Clinical Pharmacist [facility] [phone] until [phone], then [phone]

## 2017-04-23 NOTE — Progress Notes (Signed)
PROGRESS NOTE    [name]  QQV:[id number] DOB: 1933-12-10 DOA: 04/22/2017 PCP: Reymundo Poll, MD   Brief Narrative:  83 y.o. BF PMHx Chronic Systolic CHF (EF 33-29%) combination internal cardiac defibrillator (ICD) and pacemaker, COPD,Asthma, PE, Colon cancer,Colostomy , HLD, Hypothyroidism   Presents w progressively worsening dyspnea today.  Pox 70% when EMS arrived,  Placed on non-rebreather and then cpap which brought O2 to 96%.  Pt was transported to ED for evaluation .   In ED,  CTA chest => negative for PE, but showed multifocal pneumonia and small left pleural effusion. Wbc 9.1,  BUn 26/creatinine 1.26  Trop 0.08;   Pt will be admitted for acute hypoxic respiratory failure    Subjective: 7/16   A/O 4, positive acute respiratory distress (states now coughing up blood), negative CP negative N/V. States her base weight~191 pounds~87 kg.  Afebrile last 24-hour's   Assessment & Plan:   Active Problems:   Dementia   CKD (chronic kidney disease) stage 3, GFR 30-59 ml/min   Acute respiratory failure (HCC)   Elevated troponin   Pneumonia   HCAP (healthcare-associated pneumonia)   Acute respiratory failure with hypoxia/HCAP -Secondary to multifocal pneumonia, HCAP -Urine strep antigen negative, urine legionella antigen pending -Blood culture x 2 pending -Sputum culture pending -Continue current antibiotics per HCAP protocol  -Bipap PRN  Troponin elevatioin -Likely due to demand ischemia -Trop I q6h x3  Chronic Systolic and Diastolic CHF  -Echocardiogram 11/2015 LVEF 35-40% , Mild AR, Moderate MS, mild-mod MR: No significant change in EF on this admission to echocardiogram see results below -Coreg 3.125 mg BID -7/16 hold Lasix 40 mg daily -Hydralazine 12.5 mg TID -Increasing creatinine decrease lisinopril 10 mg daily -DC Imdur  -Strict I&O since admission +1 L -Daily weight Filed Weights   04/22/17 0016 04/23/17 0456  Weight: 201 lb (91.2 kg) 190 lb 7.6 oz (86.4  kg)  -Transfuse for hemoglobin<8  -7/16 transfuse 1 unit PRBC   Pulmonary hypertension -Worsening pulmonary hypertension by echocardiogram -See CHF  Anemia -Anemia panel pending  CKD stage 3 (baseline Cr~1.2) Lab Results  Component Value Date   CREATININE 1.58 (H) 04/23/2017   CREATININE 1.45 (H) 04/22/2017   CREATININE 1.26 (H) 04/22/2017  -See CHF   Hypothyroidism -Cont levothyroxine  COPD -Dulera for Advair      DVT prophylaxis: Subcutaneous heparin Code Status: Full Family Communication: None Disposition Plan: TBD   Consultants:  None    Procedures/Significant Events:  7/16 Echocardiogram: LVEF = 40% to 45%. Moderate diffuse hypokinesis with regional   variations. - Aortic valve: mild to moderate regurgitation.   - Mitral valve: moderate mitral stenosis  - Pulmonary arteries: PA peak pressure: 65 mm Hg (S).     VENTILATOR SETTINGS:    Cultures 7/15 blood pending 7/15 sputum pending 7/15 MRSA by PCR negative 7/15 urine legionella Pending 7/15 urine strep pneumo negative  7/16 transfuse 1 unit PRBC     Antimicrobials: Anti-infectives    Start     Stop   04/23/17 0500  vancomycin (VANCOCIN) 1,250 mg in sodium chloride 0.9 % 250 mL IVPB     04/29/17 2359   04/22/17 1200  ceFEPIme (MAXIPIME) 2 g in dextrose 5 % 50 mL IVPB         04/22/17 0615  ceFEPIme (MAXIPIME) 1 g in dextrose 5 % 50 mL IVPB  Status:  Discontinued     04/22/17 0612   04/22/17 0430  vancomycin (VANCOCIN) 1,500 mg in sodium chloride  0.9 % 500 mL IVPB     04/22/17 0807   04/22/17 0415  piperacillin-tazobactam (ZOSYN) IVPB 3.375 g     04/22/17 9518       Devices    LINES / TUBES:      Continuous Infusions: . ceFEPime (MAXIPIME) IV Stopped (04/22/17 1821)  . vancomycin 1,250 mg (04/23/17 0450)     Objective: Vitals:   04/22/17 1725 04/22/17 2130 04/23/17 0456 04/23/17 0733  BP: (!) 141/77 (!) 111/52  (!) 93/57  Pulse: 93 92  90  Resp: (!) 25 (!) 26   20  Temp:  99.6 F (37.6 C) 98.5 F (36.9 C)   TempSrc:  Oral Oral   SpO2: 96% 100%  96%  Weight:   190 lb 7.6 oz (86.4 kg)   Height:        Intake/Output Summary (Last 24 hours) at 04/23/17 8416 Last data filed at 04/23/17 6063  Gross per 24 hour  Intake              660 ml  Output              200 ml  Net              460 ml   Filed Weights   04/22/17 0016 04/23/17 0456  Weight: 201 lb (91.2 kg) 190 lb 7.6 oz (86.4 kg)    Examination:  General: A/O 4, positive acute respiratory distress(Resolving) Eyes: negative scleral hemorrhage, negative anisocoria, negative icterus ENT: Negative Runny nose, negative gingival bleeding, Neck:  Negative scars, masses, torticollis, lymphadenopathy, JVD Lungs: positive bibasilar crackles (significantly decreased), negative wheezing .  Cardiovascular: Regular rate and rhythm without murmur gallop or rub normal S1 and S2 Abdomen: negative abdominal pain, nondistended, positive soft, bowel sounds, no rebound, no ascites, no appreciable mass Extremities: No significant cyanosis, clubbing. Positive bilateral lower extremity edema 2+ Skin: Negative rashes, lesions, ulcers Psychiatric:  Negative depression, negative anxiety, negative fatigue, negative mania  Central nervous system:  Cranial nerves II through XII intact, tongue/uvula midline, all extremities muscle strength 5/5, sensation intact throughout, negative dysarthria, negative expressive aphasia, negative receptive aphasia.  .     Data Reviewed: Care during the described time interval was provided by me .  I have reviewed this patient's available data, including medical history, events of note, physical examination, and all test results as part of my evaluation. I have personally reviewed and interpreted all radiology studies.  CBC:  Recent Labs Lab 04/22/17 0059 04/23/17 0438  WBC 9.1 12.5*  NEUTROABS 7.4 10.2*  HGB 8.5* 7.2*  HCT 27.4* 24.2*  MCV 82.8 83.7  PLT 321 016    Basic Metabolic Panel:  Recent Labs Lab 04/22/17 0059 04/22/17 1629 04/23/17 0438  NA 135  --  138  K 4.1  --  4.3  CL 105  --  106  CO2 21*  --  23  GLUCOSE 95  --  73  BUN 26*  --  30*  CREATININE 1.26* 1.45* 1.58*  CALCIUM 9.1  --  8.4*  MG  --   --  1.8   GFR: Estimated Creatinine Clearance: 30.3 mL/min (A) (by C-G formula based on SCr of 1.58 mg/dL (H)). Liver Function Tests:  Recent Labs Lab 04/22/17 0059  AST 114*  ALT 72*  ALKPHOS 81  BILITOT 0.4  PROT 6.8  ALBUMIN 3.6   No results for input(s): LIPASE, AMYLASE in the last 168 hours. No results for input(s): AMMONIA in the last  168 hours. Coagulation Profile: No results for input(s): INR, PROTIME in the last 168 hours. Cardiac Enzymes:  Recent Labs Lab 04/22/17 0059  TROPONINI 0.08*   BNP (last 3 results) No results for input(s): PROBNP in the last 8760 hours. HbA1C: No results for input(s): HGBA1C in the last 72 hours. CBG: No results for input(s): GLUCAP in the last 168 hours. Lipid Profile: No results for input(s): CHOL, HDL, LDLCALC, TRIG, CHOLHDL, LDLDIRECT in the last 72 hours. Thyroid Function Tests: No results for input(s): TSH, T4TOTAL, FREET4, T3FREE, THYROIDAB in the last 72 hours. Anemia Panel: No results for input(s): VITAMINB12, FOLATE, FERRITIN, TIBC, IRON, RETICCTPCT in the last 72 hours. Urine analysis:    Component Value Date/Time   COLORURINE YELLOW 02/16/2017 [place] 02/16/2017 1227   LABSPEC 1.017 02/16/2017 1227   PHURINE 5.0 02/16/2017 1227   GLUCOSEU NEGATIVE 02/16/2017 1227   HGBUR NEGATIVE 02/16/2017 [place] 02/16/2017 1227   KETONESUR NEGATIVE 02/16/2017 1227   PROTEINUR 30 (A) 02/16/2017 1227   UROBILINOGEN 0.2 07/23/2015 1610   NITRITE NEGATIVE 02/16/2017 1227   LEUKOCYTESUR NEGATIVE 02/16/2017 1227   Sepsis Labs: @LABRCNTIP (procalcitonin:4,lacticidven:4)  ) Recent Results (from the past 240 hour(s))  MRSA PCR  Screening     Status: None   Collection Time: 04/22/17  4:35 PM  Result Value Ref Range Status   MRSA by PCR NEGATIVE NEGATIVE Final    Comment:        The GeneXpert MRSA Assay (FDA approved for NASAL specimens only), is one component of a comprehensive MRSA colonization surveillance program. It is not intended to diagnose MRSA infection nor to guide or monitor treatment for MRSA infections.   Culture, sputum-assessment     Status: None   Collection Time: 04/22/17  6:04 PM  Result Value Ref Range Status   Specimen Description EXPECTORATED SPUTUM  Final   Special Requests Normal  Final   Sputum evaluation THIS SPECIMEN IS ACCEPTABLE FOR SPUTUM CULTURE  Final   Report Status 04/23/2017 FINAL  Final  Culture, respiratory (NON-Expectorated)     Status: None (Preliminary result)   Collection Time: 04/22/17  6:04 PM  Result Value Ref Range Status   Specimen Description EXPECTORATED SPUTUM  Final   Special Requests Normal Reflexed from [id number]  Final   Gram Stain   Final    ABUNDANT WBC PRESENT, PREDOMINANTLY PMN FEW GRAM NEGATIVE COCCOBACILLI FEW GRAM NEGATIVE RODS RARE GRAM POSITIVE COCCI IN CLUSTERS    Culture PENDING  Incomplete   Report Status PENDING  Incomplete         Radiology Studies: Ct Angio Chest Pe W And/or Wo Contrast  Result Date: 04/22/2017 CLINICAL DATA:  Shortness of breath and hypoxia EXAM: CT ANGIOGRAPHY CHEST WITH CONTRAST TECHNIQUE: Multidetector CT imaging of the chest was performed using the standard protocol during bolus administration of intravenous contrast. Multiplanar CT image reconstructions and MIPs were obtained to evaluate the vascular anatomy. CONTRAST:  80 mL Isovue 370 intravenous COMPARISON:  Chest x-ray 04/22/2017, CT chest 07/26/2015 FINDINGS: Cardiovascular: Satisfactory opacification of the pulmonary arteries to the segmental level. No evidence of pulmonary embolism. Mild aneurysmal dilatation of the ascending aorta up to 4.1 cm. Generous  appearing pulmonary artery trunk. Aortic atherosclerosis. Intracardiac pacing leads. Cardiomegaly. No large pericardial effusion. Mediastinum/Nodes: Midline trachea. No suspicious thyroid mass. Nonspecific mediastinal adenopathy with lymph nodes measuring up to 1 cm. Esophagus within normal limits. Lungs/Pleura: Small left-sided pleural effusion. Multifocal ground-glass densities and nodular infiltrates within the  upper and lower lobes and right middle lobe. Upper Abdomen: No acute abnormality. Musculoskeletal: Degenerative changes. No acute or suspicious bone lesion. Review of the MIP images confirms the above findings. IMPRESSION: 1. Negative for acute pulmonary embolus. 2. Cardiomegaly. 3. Small left pleural effusion. Multifocal ground-glass densities and consolidation suspicious for multifocal pneumonia. 4. Mild aneurysmal dilatation of ascending aorta up to 4.1 cm. Recommend annual imaging followup by CTA or MRA. This recommendation follows 2010 ACCF/AHA/AATS/ACR/ASA/SCA/SCAI/SIR/STS/SVM Guidelines for the Diagnosis and Management of Patients with Thoracic Aortic Disease. Circulation. 2010; 121: V035-K093 Aortic aneurysm NOS (ICD10-I71.9). Aortic Atherosclerosis (ICD10-170.0) Electronically Signed   By: Donavan Foil M.D.   On: 04/22/2017 03:51   Dg Chest Portable 1 View  Result Date: 04/22/2017 CLINICAL DATA:  Shortness of breath, hypoxia. EXAM: PORTABLE CHEST 1 VIEW COMPARISON:  Chest radiograph Feb 16, 2017 and priors FINDINGS: Cardiac silhouette is mildly enlarged, mediastinal silhouette is unremarkable for this low inspiratory examination with crowded vasculature markings. The lungs are clear without pleural effusions or focal consolidations. Similar bronchitic changes. Bibasilar strandy densities. Trachea projects midline and there is no pneumothorax. Dual lead LEFT AICD in situ. Included soft tissue planes and osseous structures are non-suspicious. Osteopenia. IMPRESSION: Chronic bronchitic changes  and bibasilar atelectasis. Mild cardiomegaly Electronically Signed   By: Elon Alas M.D.   On: 04/22/2017 01:09        Scheduled Meds: . aspirin EC  81 mg Oral Daily  . atorvastatin  20 mg Oral Daily  . carvedilol  3.125 mg Oral BID WC  . famotidine  20 mg Oral Daily  . furosemide  40 mg Oral Daily  . heparin  5,000 Units Subcutaneous Q8H  . hydrALAZINE  12.5 mg Oral TID  . iron polysaccharides  150 mg Oral BID  . lactobacillus  1 g Oral TID WC  . levothyroxine  50 mcg Oral QAC breakfast  . lisinopril  10 mg Oral Daily  . loratadine  10 mg Oral Daily  . mometasone-formoterol  2 puff Inhalation BID  . polyvinyl alcohol  1 drop Both Eyes TID  . potassium chloride  10 mEq Oral Daily   Continuous Infusions: . ceFEPime (MAXIPIME) IV Stopped (04/22/17 1821)  . vancomycin 1,250 mg (04/23/17 0450)     LOS: 1 day    Time spent: 40 minutes    Shaunette Gassner, Geraldo Docker, MD [facility] Pager [phone]   If 7PM-7AM, please contact night-coverage [email or web address] Password [email or web address] 04/23/2017, 8:22 AM

## 2017-04-24 LAB — CBC WITH DIFFERENTIAL/PLATELET
BASOS ABS: 0 10*3/uL (ref 0.0–0.1)
Basophils Relative: 0 %
EOS PCT: 2 %
Eosinophils Absolute: 0.2 10*3/uL (ref 0.0–0.7)
HEMATOCRIT: 30.1 % — AB (ref 36.0–46.0)
HEMOGLOBIN: 9 g/dL — AB (ref 12.0–15.0)
LYMPHS ABS: 1.7 10*3/uL (ref 0.7–4.0)
LYMPHS PCT: 13 %
MCH: 24.9 pg — AB (ref 26.0–34.0)
MCHC: 29.9 g/dL — ABNORMAL LOW (ref 30.0–36.0)
MCV: 83.1 fL (ref 78.0–100.0)
Monocytes Absolute: 1 10*3/uL (ref 0.1–1.0)
Monocytes Relative: 8 %
NEUTROS ABS: 10.1 10*3/uL — AB (ref 1.7–7.7)
NEUTROS PCT: 77 %
PLATELETS: 296 10*3/uL (ref 150–400)
RBC: 3.62 MIL/uL — AB (ref 3.87–5.11)
RDW: 15.2 % (ref 11.5–15.5)
WBC: 13.1 10*3/uL — AB (ref 4.0–10.5)

## 2017-04-24 LAB — BASIC METABOLIC PANEL
ANION GAP: 9 (ref 5–15)
BUN: 28 mg/dL — ABNORMAL HIGH (ref 6–20)
CHLORIDE: 107 mmol/L (ref 101–111)
CO2: 23 mmol/L (ref 22–32)
Calcium: 8.9 mg/dL (ref 8.9–10.3)
Creatinine, Ser: 1.39 mg/dL — ABNORMAL HIGH (ref 0.44–1.00)
GFR calc non Af Amer: 35 mL/min — ABNORMAL LOW (ref >60)
GFR, EST AFRICAN AMERICAN: 40 mL/min — AB (ref >60)
Glucose, Bld: 84 mg/dL (ref 65–99)
POTASSIUM: 4.2 mmol/L (ref 3.5–5.1)
SODIUM: 139 mmol/L (ref 135–145)

## 2017-04-24 LAB — BPAM RBC
BLOOD PRODUCT EXPIRATION DATE: 201808032359
ISSUE DATE / TIME: 201807162134
Unit Type and Rh: 7300

## 2017-04-24 LAB — RESPIRATORY PANEL BY PCR
ADENOVIRUS-RVPPCR: NOT DETECTED
Bordetella pertussis: NOT DETECTED
CORONAVIRUS HKU1-RVPPCR: NOT DETECTED
CORONAVIRUS NL63-RVPPCR: NOT DETECTED
Chlamydophila pneumoniae: NOT DETECTED
Coronavirus 229E: NOT DETECTED
Coronavirus OC43: NOT DETECTED
Influenza A: NOT DETECTED
Influenza B: NOT DETECTED
METAPNEUMOVIRUS-RVPPCR: NOT DETECTED
Mycoplasma pneumoniae: NOT DETECTED
PARAINFLUENZA VIRUS 2-RVPPCR: NOT DETECTED
PARAINFLUENZA VIRUS 3-RVPPCR: NOT DETECTED
Parainfluenza Virus 1: NOT DETECTED
Parainfluenza Virus 4: NOT DETECTED
RHINOVIRUS / ENTEROVIRUS - RVPPCR: NOT DETECTED
Respiratory Syncytial Virus: NOT DETECTED

## 2017-04-24 LAB — TYPE AND SCREEN
ABO/RH(D): B POS
ANTIBODY SCREEN: NEGATIVE
UNIT DIVISION: 0

## 2017-04-24 LAB — OCCULT BLOOD X 1 CARD TO LAB, STOOL
Fecal Occult Bld: NEGATIVE
Fecal Occult Bld: NEGATIVE

## 2017-04-24 LAB — MAGNESIUM: MAGNESIUM: 1.8 mg/dL (ref 1.7–2.4)

## 2017-04-24 MED ORDER — VANCOMYCIN HCL 10 G IV SOLR
1250.0000 mg | INTRAVENOUS | Status: DC
  Administered 2017-04-25 – 2017-04-27 (×3): 1250 mg via INTRAVENOUS
  Filled 2017-04-24 (×3): qty 1250

## 2017-04-24 NOTE — Plan of Care (Signed)
Problem: Pain Managment: Goal: General experience of comfort will improve Outcome: Progressing Pt denies any pain.  Problem: Physical Regulation: Goal: Ability to maintain clinical measurements within normal limits will improve Outcome: Progressing VSS. Pt continues to cough of blood streaked sputum. PRN medication administered. Tolerated well. Pt using Pure Wick for accurate I/O. Changed and peri care given. Ostomy emptied. Continue to monitor.  Problem: Tissue Perfusion: Goal: Risk factors for ineffective tissue perfusion will decrease Outcome: Progressing VSS. Pt remains on RA. 1 unit PRBC administered. Pt denies any SOB. Labs drawn this am. Occult Blood sent to lab.

## 2017-04-24 NOTE — Progress Notes (Signed)
Pharmacy Antibiotic Note  [name] is a 83 y.o. female admitted on 04/22/2017 with pneumonia.  Pharmacy has been consulted for vancomycin and cefepime dosing.  Patient's renal function is fluctuating, currently improving.  She remains afebrile and her WBC is elevated at 13.1.   Plan: Increase vanc back to 1250mg  IV Q24H, 8 days total Continue cefepime 1gm IV Q24H Monitor renal fxn, micro data, vanc trough as indicated   Height: 5\' 5"  (165.1 cm) Weight: 193 lb 12.6 oz (87.9 kg) IBW/kg (Calculated) : 57  Temp (24hrs), Avg:99.2 F (37.3 C), Min:98.6 F (37 C), Max:100 F (37.8 C)   Recent Labs Lab 04/22/17 0059 04/22/17 0559 04/22/17 1629 04/23/17 0438 04/23/17 1623 04/24/17 0225  WBC 9.1  --   --  12.5* 13.3* 13.1*  CREATININE 1.26*  --  1.45* 1.58*  --  1.39*  LATICACIDVEN  --  1.30  --   --   --   --     Estimated Creatinine Clearance: 34.8 mL/min (A) (by C-G formula based on SCr of 1.39 mg/dL (H)).    No Known Allergies   Vanc 7/15 >> (7/22) Cefepime 7/15 >>  7/15 sputum - GNR / GPC / GN coccobacilli on Gram stain 7/15 BCx - NGTD 7/16 resp PCR -    [name], PharmD, BCPS Pager:  [phone] 04/24/2017, 7:44 AM

## 2017-04-24 NOTE — Progress Notes (Signed)
PROGRESS NOTE    [name]  ASN:[id number] DOB: 1933/12/23 DOA: 04/22/2017 PCP: Reymundo Poll, MD   Brief Narrative:  82 y.o. BF PMHx Chronic Systolic CHF (EF 19-37%) combination internal cardiac defibrillator (ICD) and pacemaker, COPD,Asthma, PE, Colon cancer,Colostomy , HLD, Hypothyroidism   Presents w progressively worsening dyspnea today.  Pox 70% when EMS arrived,  Placed on non-rebreather and then cpap which brought O2 to 96%.  Pt was transported to ED for evaluation .   In ED,  CTA chest => negative for PE, but showed multifocal pneumonia and small left pleural effusion. Wbc 9.1,  BUn 26/creatinine 1.26  Trop 0.08;   Pt will be admitted for acute hypoxic respiratory failure    Subjective: 7/17  A/O 4, positive acute respiratory distress (states now coughing up blood), negative CP negative N/V. States her base weight~191 pounds~87 kg.  Afebrile last 24-hour's   Assessment & Plan:   Active Problems:   Dementia   CKD (chronic kidney disease) stage 3, GFR 30-59 ml/min   Acute respiratory failure (HCC)   Elevated troponin   Pneumonia   HCAP (healthcare-associated pneumonia)   Acute respiratory failure with hypoxia/HCAP -Secondary to multifocal pneumonia, HCAP -Urine strep antigen negative, urine legionella antigen pending -Blood culture x 2, NGTD -Sputum culture pending -Continue current antibiotics per HCAP protocol  -Bipap PRN -PT/OT consult placed pending -Social work consult placed pending  Troponin elevatioin -Likely due to demand ischemia -Asymptomatic  Chronic Systolic and Diastolic CHF  -Echocardiogram 11/2015 LVEF 35-40% , Mild AR, Moderate MS, mild-mod MR: No significant change in EF on this admission to echocardiogram see results below -Coreg 3.125 mg BID -Continue to hold  Lasix 40 mg daily -Hydralazine 12.5 mg TID -Continue decreased lisinopril 10 mg daily secondary to increased Cr -DC Imdur  -Strict I&O since admission +1 L -Daily weight Filed  Weights   04/22/17 0016 04/23/17 0456 04/24/17 0330  Weight: 201 lb (91.2 kg) 190 lb 7.6 oz (86.4 kg) 193 lb 12.6 oz (87.9 kg)  -Transfuse for hemoglobin<8  -7/16 transfuse 1 unit PRBC   Pulmonary hypertension -Worsening pulmonary hypertension by echocardiogram -See CHF  Anemia -Anemia panel pending  CKD stage 3 (baseline Cr~1.2) Lab Results  Component Value Date   CREATININE 1.39 (H) 04/24/2017   CREATININE 1.58 (H) 04/23/2017   CREATININE 1.45 (H) 04/22/2017  -See CHF   Hypothyroidism -Cont levothyroxine  COPD -Dulera for Advair      DVT prophylaxis: Subcutaneous heparin Code Status: Full Family Communication: Spoke with son concerned about the patient's current SNF, would like to speak with social work concerning movement of patient to different SNF. Disposition Plan: TBD   Consultants:  None    Procedures/Significant Events:  7/16 Echocardiogram: LVEF = 40% to 45%. Moderate diffuse hypokinesis with regional   variations. - Aortic valve: mild to moderate regurgitation.   - Mitral valve: moderate mitral stenosis  - Pulmonary arteries: PA peak pressure: 65 mm Hg (S).     VENTILATOR SETTINGS:    Cultures 7/15 blood NGTD 7/15 sputum pending 7/15 MRSA by PCR negative 7/15 urine legionella Pending 7/15 urine strep pneumo negative  7/16 respiratory virus panel negative 7/16 transfuse 1 unit PRBC     Antimicrobials: Anti-infectives    Start     Stop   04/23/17 0500  vancomycin (VANCOCIN) 1,250 mg in sodium chloride 0.9 % 250 mL IVPB     04/29/17 2359   04/22/17 1200  ceFEPIme (MAXIPIME) 2 g in dextrose 5 % 50 mL  IVPB         04/22/17 0615  ceFEPIme (MAXIPIME) 1 g in dextrose 5 % 50 mL IVPB  Status:  Discontinued     04/22/17 0612   04/22/17 0430  vancomycin (VANCOCIN) 1,500 mg in sodium chloride 0.9 % 500 mL IVPB     04/22/17 0807   04/22/17 0415  piperacillin-tazobactam (ZOSYN) IVPB 3.375 g     04/22/17 6270       Devices    LINES  / TUBES:      Continuous Infusions: . ceFEPime (MAXIPIME) IV Stopped (04/23/17 1704)  . [START ON 04/25/2017] vancomycin       Objective: Vitals:   04/23/17 2205 04/24/17 0045 04/24/17 0330 04/24/17 0807  BP: 132/61 (!) 132/58 (!) 153/65 (!) 164/79  Pulse: (!) 101 97 96 96  Resp: 20 (!) 24 (!) 25 (!) 25  Temp: 99.9 F (37.7 C) 98.6 F (37 C) 98.7 F (37.1 C) 98.6 F (37 C)  TempSrc: Oral  Oral Oral  SpO2: 95% 99% 97% 98%  Weight:   193 lb 12.6 oz (87.9 kg)   Height:        Intake/Output Summary (Last 24 hours) at 04/24/17 0856 Last data filed at 04/24/17 0631  Gross per 24 hour  Intake              705 ml  Output             1450 ml  Net             -745 ml   Filed Weights   04/22/17 0016 04/23/17 0456 04/24/17 0330  Weight: 201 lb (91.2 kg) 190 lb 7.6 oz (86.4 kg) 193 lb 12.6 oz (87.9 kg)    Examination:  General: A/O 4, Negative acute respiratory distress Eyes: negative scleral hemorrhage, negative anisocoria, negative icterus ENT: Negative Runny nose, negative gingival bleeding, Neck:  Negative scars, masses, torticollis, lymphadenopathy, JVD Lungs: positive bibasilar crackles (Improving), negative wheezing .  Cardiovascular: Regular rate and rhythm without murmur gallop or rub normal S1 and S2 Abdomen: negative abdominal pain, nondistended, positive soft, bowel sounds, no rebound, no ascites, no appreciable mass Extremities: No significant cyanosis, clubbing. Positive bilateral lower extremity edema 1+ Skin: Negative rashes, lesions, ulcers Psychiatric:  Negative depression, negative anxiety, negative fatigue, negative mania  Central nervous system:  Cranial nerves II through XII intact, tongue/uvula midline, all extremities muscle strength 5/5, sensation intact throughout, negative dysarthria, negative expressive aphasia, negative receptive aphasia.  .     Data Reviewed: Care during the described time interval was provided by me .  I have reviewed this  patient's available data, including medical history, events of note, physical examination, and all test results as part of my evaluation. I have personally reviewed and interpreted all radiology studies.  CBC:  Recent Labs Lab 04/22/17 0059 04/23/17 0438 04/23/17 1623 04/24/17 0225  WBC 9.1 12.5* 13.3* 13.1*  NEUTROABS 7.4 10.2*  --  10.1*  HGB 8.5* 7.2* 7.6* 9.0*  HCT 27.4* 24.2* 25.9* 30.1*  MCV 82.8 83.7 83.8 83.1  PLT 321 285 333 350   Basic Metabolic Panel:  Recent Labs Lab 04/22/17 0059 04/22/17 1629 04/23/17 0438 04/24/17 0225  NA 135  --  138 139  K 4.1  --  4.3 4.2  CL 105  --  106 107  CO2 21*  --  23 23  GLUCOSE 95  --  73 84  BUN 26*  --  30* 28*  CREATININE 1.26*  1.45* 1.58* 1.39*  CALCIUM 9.1  --  8.4* 8.9  MG  --   --  1.8 1.8   GFR: Estimated Creatinine Clearance: 34.8 mL/min (A) (by C-G formula based on SCr of 1.39 mg/dL (H)). Liver Function Tests:  Recent Labs Lab 04/22/17 0059  AST 114*  ALT 72*  ALKPHOS 81  BILITOT 0.4  PROT 6.8  ALBUMIN 3.6   No results for input(s): LIPASE, AMYLASE in the last 168 hours. No results for input(s): AMMONIA in the last 168 hours. Coagulation Profile: No results for input(s): INR, PROTIME in the last 168 hours. Cardiac Enzymes:  Recent Labs Lab 04/22/17 0059  TROPONINI 0.08*   BNP (last 3 results) No results for input(s): PROBNP in the last 8760 hours. HbA1C: No results for input(s): HGBA1C in the last 72 hours. CBG: No results for input(s): GLUCAP in the last 168 hours. Lipid Profile: No results for input(s): CHOL, HDL, LDLCALC, TRIG, CHOLHDL, LDLDIRECT in the last 72 hours. Thyroid Function Tests: No results for input(s): TSH, T4TOTAL, FREET4, T3FREE, THYROIDAB in the last 72 hours. Anemia Panel:  Recent Labs  04/23/17 1623  VITAMINB12 207  FOLATE 10.9  FERRITIN 68  TIBC 244*  IRON 8*  RETICCTPCT 0.9   Urine analysis:    Component Value Date/Time   COLORURINE YELLOW 02/16/2017  [place] 02/16/2017 1227   LABSPEC 1.017 02/16/2017 1227   PHURINE 5.0 02/16/2017 1227   GLUCOSEU NEGATIVE 02/16/2017 1227   [place] 02/16/2017 1227   [place] 02/16/2017 1227   KETONESUR NEGATIVE 02/16/2017 1227   PROTEINUR 30 (A) 02/16/2017 1227   UROBILINOGEN 0.2 07/23/2015 1610   NITRITE NEGATIVE 02/16/2017 1227   LEUKOCYTESUR NEGATIVE 02/16/2017 1227   Sepsis Labs: @LABRCNTIP (procalcitonin:4,lacticidven:4)  ) Recent Results (from the past 240 hour(s))  MRSA PCR Screening     Status: None   Collection Time: 04/22/17  4:35 PM  Result Value Ref Range Status   MRSA by PCR NEGATIVE NEGATIVE Final    Comment:        The GeneXpert MRSA Assay (FDA approved for NASAL specimens only), is one component of a comprehensive MRSA colonization surveillance program. It is not intended to diagnose MRSA infection nor to guide or monitor treatment for MRSA infections.   Culture, blood (routine x 2) Call MD if unable to obtain prior to antibiotics being given     Status: None (Preliminary result)   Collection Time: 04/22/17  4:47 PM  Result Value Ref Range Status   Specimen Description BLOOD LEFT HAND  Final   Special Requests   Final    BOTTLES DRAWN AEROBIC ONLY Blood Culture results may not be optimal due to an inadequate volume of blood received in culture bottles   Culture NO GROWTH < 24 HOURS  Final   Report Status PENDING  Incomplete  Culture, blood (Routine X 2) w Reflex to ID Panel     Status: None (Preliminary result)   Collection Time: 04/22/17  5:42 PM  Result Value Ref Range Status   Specimen Description BLOOD LEFT HAND  Final   Special Requests IN PEDIATRIC BOTTLE Blood Culture adequate volume  Final   Culture NO GROWTH < 24 HOURS  Final   Report Status PENDING  Incomplete  Culture, sputum-assessment     Status: None   Collection Time: 04/22/17  6:04 PM  Result Value Ref Range Status   Specimen Description EXPECTORATED SPUTUM  Final    Special Requests Normal  Final  Sputum evaluation THIS SPECIMEN IS ACCEPTABLE FOR SPUTUM CULTURE  Final   Report Status 04/23/2017 FINAL  Final  Culture, respiratory (NON-Expectorated)     Status: None (Preliminary result)   Collection Time: 04/22/17  6:04 PM  Result Value Ref Range Status   Specimen Description EXPECTORATED SPUTUM  Final   Special Requests Normal Reflexed from [id number]  Final   Gram Stain   Final    ABUNDANT WBC PRESENT, PREDOMINANTLY PMN FEW GRAM NEGATIVE COCCOBACILLI FEW GRAM NEGATIVE RODS RARE GRAM POSITIVE COCCI IN CLUSTERS    Culture PENDING  Incomplete   Report Status PENDING  Incomplete         Radiology Studies: No results found.      Scheduled Meds: . aspirin EC  81 mg Oral Daily  . atorvastatin  20 mg Oral Daily  . carvedilol  3.125 mg Oral BID WC  . famotidine  20 mg Oral Daily  . heparin  5,000 Units Subcutaneous Q8H  . hydrALAZINE  12.5 mg Oral TID  . iron polysaccharides  150 mg Oral BID  . lactobacillus  1 g Oral TID WC  . levothyroxine  50 mcg Oral QAC breakfast  . lisinopril  10 mg Oral Daily  . loratadine  10 mg Oral Daily  . mometasone-formoterol  2 puff Inhalation BID  . polyvinyl alcohol  1 drop Both Eyes TID  . potassium chloride  10 mEq Oral Daily   Continuous Infusions: . ceFEPime (MAXIPIME) IV Stopped (04/23/17 1704)  . [START ON 04/25/2017] vancomycin       LOS: 2 days    Time spent: 40 minutes    [name], Geraldo Docker, MD [facility] Pager [phone]   If 7PM-7AM, please contact night-coverage [email or web address] Password [facility] 04/24/2017, 8:56 AM

## 2017-04-25 LAB — CULTURE, RESPIRATORY W GRAM STAIN
Culture: NORMAL
Special Requests: NORMAL

## 2017-04-25 LAB — BASIC METABOLIC PANEL
Anion gap: 9 (ref 5–15)
BUN: 20 mg/dL (ref 6–20)
CHLORIDE: 106 mmol/L (ref 101–111)
CO2: 26 mmol/L (ref 22–32)
Calcium: 9.3 mg/dL (ref 8.9–10.3)
Creatinine, Ser: 1.24 mg/dL — ABNORMAL HIGH (ref 0.44–1.00)
GFR calc Af Amer: 46 mL/min — ABNORMAL LOW (ref >60)
GFR calc non Af Amer: 40 mL/min — ABNORMAL LOW (ref >60)
GLUCOSE: 87 mg/dL (ref 65–99)
POTASSIUM: 4.2 mmol/L (ref 3.5–5.1)
Sodium: 141 mmol/L (ref 135–145)

## 2017-04-25 LAB — CBC WITH DIFFERENTIAL/PLATELET
BASOS ABS: 0 10*3/uL (ref 0.0–0.1)
Basophils Relative: 0 %
Eosinophils Absolute: 0.1 10*3/uL (ref 0.0–0.7)
Eosinophils Relative: 1 %
HEMATOCRIT: 29.7 % — AB (ref 36.0–46.0)
Hemoglobin: 9.1 g/dL — ABNORMAL LOW (ref 12.0–15.0)
LYMPHS ABS: 2.1 10*3/uL (ref 0.7–4.0)
LYMPHS PCT: 20 %
MCH: 25.5 pg — AB (ref 26.0–34.0)
MCHC: 30.6 g/dL (ref 30.0–36.0)
MCV: 83.2 fL (ref 78.0–100.0)
MONO ABS: 0.6 10*3/uL (ref 0.1–1.0)
MONOS PCT: 5 %
NEUTROS ABS: 7.7 10*3/uL (ref 1.7–7.7)
Neutrophils Relative %: 74 %
Platelets: 335 10*3/uL (ref 150–400)
RBC: 3.57 MIL/uL — ABNORMAL LOW (ref 3.87–5.11)
RDW: 15.7 % — AB (ref 11.5–15.5)
WBC: 10.5 10*3/uL (ref 4.0–10.5)

## 2017-04-25 LAB — MAGNESIUM: MAGNESIUM: 1.9 mg/dL (ref 1.7–2.4)

## 2017-04-25 LAB — HAPTOGLOBIN: Haptoglobin: 345 mg/dL — ABNORMAL HIGH (ref 34–200)

## 2017-04-25 LAB — CULTURE, RESPIRATORY

## 2017-04-25 NOTE — Evaluation (Signed)
Physical Therapy Evaluation Patient Details Name: [name] MRN: [id number] DOB: [birth year] Today's Date: 04/25/2017   History of Present Illness  83 y.o.BF PMHx Chronic Systolic CHF (EF 28-78%) combination internal cardiac defibrillator (ICD) and pacemaker, COPD,Asthma, PE, Colon cancer,Colostomy , HLD, Hypothyroidism. Came in with worsening dyspnea, CTA indicative of PNA.  Clinical Impression  Pt admitted with above diagnosis. Pt currently with functional limitations due to the deficits listed below (see PT Problem List). Pt requiring mod A for bed mobility and transfers due to weakness and fatigue. Ambulated 20' with RW and min A. Fatigued after session and with coughing that has made her "achy all over" especially her chest.  Pt will benefit from skilled PT to increase their independence and safety with mobility to allow discharge to the venue listed below.       Follow Up Recommendations SNF    Equipment Recommendations  None recommended by PT    Recommendations for Other Services       Precautions / Restrictions Precautions Precautions: Fall Restrictions Weight Bearing Restrictions: No Other Position/Activity Restrictions: ostomy      Mobility  Bed Mobility Overal bed mobility: Needs Assistance Bed Mobility: Supine to Sit     Supine to sit: Mod assist     General bed mobility comments: mod A for rolling right fully and getting legs off bed, mod A for trunk elevation and scooting hips to EOB  Transfers Overall transfer level: Needs assistance Equipment used: Rolling walker (2 wheeled) Transfers: Sit to/from [facility] Sit to Stand: Mod assist Stand pivot transfers: Min assist       General transfer comment: mod A for power up to standing, min A for support with pivot to [facility]  Ambulation/Gait Ambulation/Gait assistance: Min assist Ambulation Distance (Feet): 20 Feet Assistive device: Rolling walker (2 wheeled) Gait Pattern/deviations:  Step-through pattern;Decreased stride length;Trunk flexed Gait velocity: decreased Gait velocity interpretation: <1.8 ft/sec, indicative of risk for recurrent falls General Gait Details: vc's for posture, very slow moving  Stairs            Wheelchair Mobility    Modified Rankin (Stroke Patients Only)       Balance Overall balance assessment: Needs assistance Sitting-balance support: No upper extremity supported Sitting balance-Leahy Scale: Fair     Standing balance support: Bilateral upper extremity supported Standing balance-Leahy Scale: Poor Standing balance comment: unable to let go of support with one hand to safely perform perineal care                             Pertinent Vitals/Pain Pain Assessment: Faces Faces Pain Scale: Hurts even more Pain Location: achy all over Pain Descriptors / Indicators: Aching Pain Intervention(s): Limited activity within patient's tolerance;Monitored during session    Home Living Family/patient expects to be discharged to:: Skilled nursing facility                 Additional Comments: pt reports that she is going to [facility]    Prior Function Level of Independence: Needs assistance   Gait / Transfers Assistance Needed: ambulated with rollator  ADL's / Homemaking Assistance Needed: reports that staff helped her with washing her feet and back and putting on her stockings        Hand Dominance   Dominant Hand: Right    Extremity/Trunk Assessment   Upper Extremity Assessment Upper Extremity Assessment: Generalized weakness    Lower Extremity Assessment Lower Extremity Assessment:  Generalized weakness    Cervical / Trunk Assessment Cervical / Trunk Assessment: Kyphotic  Communication   Communication: No difficulties  Cognition Arousal/Alertness: Awake/alert Behavior During Therapy: WFL for tasks assessed/performed Overall Cognitive Status: Within Functional Limits for tasks assessed                                         General Comments      Exercises General Exercises - Lower Extremity Ankle Circles/Pumps: AROM;Both;10 reps;Seated Long Arc Quad: AROM;Both;10 reps;Seated   Assessment/Plan    PT Assessment Patient needs continued PT services  PT Problem List Decreased strength;Decreased activity tolerance;Decreased balance;Decreased mobility;Decreased knowledge of precautions;Pain       PT Treatment Interventions DME instruction;Gait training;Functional mobility training;Therapeutic exercise;Therapeutic activities;Balance training;Patient/family education    PT Goals (Current goals can be found in the Care Plan section)  Acute Rehab PT Goals Patient Stated Goal: not go back to [facility] PT Goal Formulation: With patient Time For Goal Achievement: 05/09/17 Potential to Achieve Goals: Good    Frequency Min 2X/week   Barriers to discharge        Co-evaluation               AM-PAC PT "6 Clicks" Daily Activity  Outcome Measure Difficulty turning over in bed (including adjusting bedclothes, sheets and blankets)?: Total Difficulty moving from lying on back to sitting on the side of the bed? : Total Difficulty sitting down on and standing up from a chair with arms (e.g., wheelchair, bedside commode, etc,.)?: Total Help needed moving to and from a bed to chair (including a wheelchair)?: A Lot Help needed walking in hospital room?: A Little Help needed climbing 3-5 steps with a railing? : A Lot 6 Click Score: 10    End of Session Equipment Utilized During Treatment: Gait belt Activity Tolerance: Patient tolerated treatment well Patient left: in chair;with call bell/phone within reach Nurse Communication: Mobility status PT Visit Diagnosis: Unsteadiness on feet (R26.81);Muscle weakness (generalized) (M62.81);Difficulty in walking, not elsewhere classified (R26.2)    Time: 1440-1511 PT Time Calculation (min) (ACUTE ONLY): 31  min   Charges:   PT Evaluation $PT Eval Moderate Complexity: 1 Procedure PT Treatments $Gait Training: 8-22 mins   PT G Codes:        Leighton Roach, PT  Acute Rehab Services  [place] 04/25/2017, 3:51 PM

## 2017-04-25 NOTE — Progress Notes (Signed)
PROGRESS NOTE    [name]  HKV:[id number] DOB: [birth year] DOA: 04/22/2017 PCP: Reymundo Poll, MD   Brief Narrative:  83 y.o. BF PMHx Chronic Systolic CHF (EF 56-43%) combination internal cardiac defibrillator (ICD) and pacemaker, COPD,Asthma, PE, Colon cancer,Colostomy , HLD, Hypothyroidism   Presents w progressively worsening dyspnea on 7/15.  Pox 70% when EMS arrived,  Placed on non-rebreather and then cpap which brought O2 to 96%. In ED,  CTA chest => negative for PE, but showed multifocal pneumonia and small left pleural effusion. Wbc 9.1,  BUn 26/creatinine 1.26  Trop 0.08;   Pt admitted for acute hypoxic respiratory failure has responded to antibiotics well and able to be weaned off of oxygen.    Subjective: Patient with no complaints other than mild cough.   Assessment & Plan:   Active Problems:   Dementia   CKD (chronic kidney disease) stage 3, GFR 30-59 ml/min   Acute respiratory failure (HCC)   Elevated troponin   Pneumonia   HCAP (healthcare-associated pneumonia)   Acute respiratory failure with hypoxia/HCAP Able to be weaned off of oxygen. Speech therapy to check swallow evaluation  -Secondary to multifocal pneumonia, HCAP -Urine strep antigen negative, urine legionella antigen pending -Blood culture x 2, NGTD -Sputum culture pending -Continue current antibiotics per HCAP protocol  -Bipap PRN -PT/OT consult placed pending -Social work consult placed pending  Troponin elevatioin -Likely due to demand ischemia -Asymptomatic  Chronic Systolic and Diastolic CHF  -Echocardiogram 11/2015 LVEF 35-40% , Mild AR, Moderate MS, mild-mod MR: No significant change in EF on this admission to echocardiogram see results below -Coreg 3.125 mg BID -Continue to hold  Lasix 40 mg daily -Hydralazine 12.5 mg TID -Continue decreased lisinopril 10 mg daily secondary to increased Cr -DC Imdur  -Strict I&O since admission  less than half a liter positive  -Daily weight Filed  Weights   04/22/17 0016 04/23/17 0456 04/24/17 0330  Weight: 91.2 kg (201 lb) 86.4 kg (190 lb 7.6 oz) 87.9 kg (193 lb 12.6 oz)    Pulmonary hypertension -Worsening pulmonary hypertension by echocardiogram -See CHF  Anemia -Transfuse for hemoglobin<8  -7/16 transfuse 1 unit PRBC  Hemoglobin stable for past few days  CKD stage 3 (baseline Cr~1.2) Lab Results  Component Value Date   CREATININE 1.24 (H) 04/25/2017   CREATININE 1.39 (H) 04/24/2017   CREATININE 1.58 (H) 04/23/2017  Maintaining diuresis and creatinine trending today at baseline   Hypothyroidism -Cont levothyroxine  COPD -Dulera for Advair      DVT prophylaxis: Subcutaneous heparin Code Status: Full Family Communication: Spoke with son concerned about the patient's current SNF, would like to speak with social work concerning movement of patient to different SNF. Disposition Plan: Potential skilled nursing, PT NOT to evaluate. Downgrade status to telemetry. Potential discharge in next 24-48 hours   Consultants:  None    Procedures/Significant Events:  7/16 Echocardiogram: LVEF = 40% to 45%. Moderate diffuse hypokinesis with regional   variations. - Aortic valve: mild to moderate regurgitation.   - Mitral valve: moderate mitral stenosis  - Pulmonary arteries: PA peak pressure: 65 mm Hg (S).     VENTILATOR SETTINGS:    Cultures 7/15 blood NGTD 7/15 sputum pending 7/15 MRSA by PCR negative 7/15 urine legionella Pending 7/15 urine strep pneumo negative  7/16 respiratory virus panel negative 7/16 transfuse 1 unit PRBC     Antimicrobials: Anti-infectives    Start     Stop   04/23/17 0500  vancomycin (VANCOCIN) 1,250 mg in  sodium chloride 0.9 % 250 mL IVPB     04/29/17 2359   04/22/17 1200  ceFEPIme (MAXIPIME) 2 g in dextrose 5 % 50 mL IVPB         04/22/17 0615  ceFEPIme (MAXIPIME) 1 g in dextrose 5 % 50 mL IVPB  Status:  Discontinued     04/22/17 0612   04/22/17 0430  vancomycin  (VANCOCIN) 1,500 mg in sodium chloride 0.9 % 500 mL IVPB     04/22/17 0807   04/22/17 0415  piperacillin-tazobactam (ZOSYN) IVPB 3.375 g     04/22/17 4259       Devices    LINES / TUBES:      Continuous Infusions: . ceFEPime (MAXIPIME) IV 1 g (04/24/17 1756)  . vancomycin Stopped (04/25/17 0546)     Objective: Vitals:   04/24/17 1634 04/24/17 1927 04/24/17 2020 04/25/17 0425  BP:   (!) 112/99 (!) 157/70  Pulse: 91  90 97  Resp:   14 (!) 26  Temp:   99.1 F (37.3 C) 99.5 F (37.5 C)  TempSrc:   Oral Oral  SpO2:  98% 97% 93%  Weight:      Height:        Intake/Output Summary (Last 24 hours) at 04/25/17 1501 Last data filed at 04/25/17 0930  Gross per 24 hour  Intake              850 ml  Output             1400 ml  Net             -550 ml   Filed Weights   04/22/17 0016 04/23/17 0456 04/24/17 0330  Weight: 91.2 kg (201 lb) 86.4 kg (190 lb 7.6 oz) 87.9 kg (193 lb 12.6 oz)    Examination:  General: O oriented 3, no acute distress HEENT: Normocephalic, atraumatic, mucous membranes are moist Neck: Supple, no JVD Lungs: Few expiratory crackles, no labored breathing .  Cardiovascular: Regular rate and rhyt, soft 2/6 systolic ejection murmur is Abdomen: Soft, nontender, nondistended, positive bowel sounds  Extremities: No significant cyanosis, clubbing. Positive bilateral lower extremity edema 1+ Skin:No skin breaks, tears or lesions Psychiatry: Flattened affect, otherwise appropriate, no evidence of psychoses Neuro: No focal deficits   Data Reviewed: Care during the described time interval was provided by me .  I have reviewed this patient's available data, including medical history, events of note, physical examination, and all test results as part of my evaluation. I have personally reviewed and interpreted all radiology studies.  CBC:  Recent Labs Lab 04/22/17 0059 04/23/17 0438 04/23/17 1623 04/24/17 0225 04/25/17 0259  WBC 9.1 12.5* 13.3* 13.1*  10.5  NEUTROABS 7.4 10.2*  --  10.1* 7.7  HGB 8.5* 7.2* 7.6* 9.0* 9.1*  HCT 27.4* 24.2* 25.9* 30.1* 29.7*  MCV 82.8 83.7 83.8 83.1 83.2  PLT 321 285 333 296 563   Basic Metabolic Panel:  Recent Labs Lab 04/22/17 0059 04/22/17 1629 04/23/17 0438 04/24/17 0225 04/25/17 0259  NA 135  --  138 139 141  K 4.1  --  4.3 4.2 4.2  CL 105  --  106 107 106  CO2 21*  --  23 23 26   GLUCOSE 95  --  73 84 87  BUN 26*  --  30* 28* 20  CREATININE 1.26* 1.45* 1.58* 1.39* 1.24*  CALCIUM 9.1  --  8.4* 8.9 9.3  MG  --   --  1.8 1.8 1.9  GFR: Estimated Creatinine Clearance: 39 mL/min (A) (by C-G formula based on SCr of 1.24 mg/dL (H)). Liver Function Tests:  Recent Labs Lab 04/22/17 0059  AST 114*  ALT 72*  ALKPHOS 81  BILITOT 0.4  PROT 6.8  ALBUMIN 3.6   No results for input(s): LIPASE, AMYLASE in the last 168 hours. No results for input(s): AMMONIA in the last 168 hours. Coagulation Profile: No results for input(s): INR, PROTIME in the last 168 hours. Cardiac Enzymes:  Recent Labs Lab 04/22/17 0059  TROPONINI 0.08*   BNP (last 3 results) No results for input(s): PROBNP in the last 8760 hours. HbA1C: No results for input(s): HGBA1C in the last 72 hours. CBG: No results for input(s): GLUCAP in the last 168 hours. Lipid Profile: No results for input(s): CHOL, HDL, LDLCALC, TRIG, CHOLHDL, LDLDIRECT in the last 72 hours. Thyroid Function Tests: No results for input(s): TSH, T4TOTAL, FREET4, T3FREE, THYROIDAB in the last 72 hours. Anemia Panel:  Recent Labs  04/23/17 1623  VITAMINB12 207  FOLATE 10.9  FERRITIN 68  TIBC 244*  IRON 8*  RETICCTPCT 0.9   Urine analysis:    Component Value Date/Time   COLORURINE YELLOW 02/16/2017 [place] 02/16/2017 1227   LABSPEC 1.017 02/16/2017 1227   PHURINE 5.0 02/16/2017 1227   GLUCOSEU NEGATIVE 02/16/2017 1227   HGBUR NEGATIVE 02/16/2017 1227   [place] 02/16/2017 1227   KETONESUR NEGATIVE  02/16/2017 1227   PROTEINUR 30 (A) 02/16/2017 1227   UROBILINOGEN 0.2 07/23/2015 1610   NITRITE NEGATIVE 02/16/2017 1227   LEUKOCYTESUR NEGATIVE 02/16/2017 1227   Sepsis Labs: @LABRCNTIP (procalcitonin:4,lacticidven:4)  ) Recent Results (from the past 240 hour(s))  MRSA PCR Screening     Status: None   Collection Time: 04/22/17  4:35 PM  Result Value Ref Range Status   MRSA by PCR NEGATIVE NEGATIVE Final    Comment:        The GeneXpert MRSA Assay (FDA approved for NASAL specimens only), is one component of a comprehensive MRSA colonization surveillance program. It is not intended to diagnose MRSA infection nor to guide or monitor treatment for MRSA infections.   Culture, blood (routine x 2) Call MD if unable to obtain prior to antibiotics being given     Status: None (Preliminary result)   Collection Time: 04/22/17  4:47 PM  Result Value Ref Range Status   Specimen Description BLOOD LEFT HAND  Final   Special Requests   Final    BOTTLES DRAWN AEROBIC ONLY Blood Culture results may not be optimal due to an inadequate volume of blood received in culture bottles   Culture NO GROWTH 2 DAYS  Final   Report Status PENDING  Incomplete  Culture, blood (Routine X 2) w Reflex to ID Panel     Status: None (Preliminary result)   Collection Time: 04/22/17  5:42 PM  Result Value Ref Range Status   Specimen Description BLOOD LEFT HAND  Final   Special Requests IN PEDIATRIC BOTTLE Blood Culture adequate volume  Final   Culture NO GROWTH 2 DAYS  Final   Report Status PENDING  Incomplete  Culture, sputum-assessment     Status: None   Collection Time: 04/22/17  6:04 PM  Result Value Ref Range Status   Specimen Description EXPECTORATED SPUTUM  Final   Special Requests Normal  Final   Sputum evaluation THIS SPECIMEN IS ACCEPTABLE FOR SPUTUM CULTURE  Final   Report Status 04/23/2017 FINAL  Final  Culture, respiratory (NON-Expectorated)  Status: None   Collection Time: 04/22/17  6:04 PM    Result Value Ref Range Status   Specimen Description EXPECTORATED SPUTUM  Final   Special Requests Normal Reflexed from [id number]  Final   Gram Stain   Final    ABUNDANT WBC PRESENT, PREDOMINANTLY PMN FEW GRAM NEGATIVE COCCOBACILLI FEW GRAM NEGATIVE RODS RARE GRAM POSITIVE COCCI IN CLUSTERS    Culture Consistent with normal respiratory flora.  Final   Report Status 04/25/2017 FINAL  Final  Respiratory Panel by PCR     Status: None   Collection Time: 04/23/17  3:54 PM  Result Value Ref Range Status   Adenovirus NOT DETECTED NOT DETECTED Final   Coronavirus 229E NOT DETECTED NOT DETECTED Final   Coronavirus HKU1 NOT DETECTED NOT DETECTED Final   Coronavirus NL63 NOT DETECTED NOT DETECTED Final   Coronavirus OC43 NOT DETECTED NOT DETECTED Final   Metapneumovirus NOT DETECTED NOT DETECTED Final   Rhinovirus / Enterovirus NOT DETECTED NOT DETECTED Final   Influenza A NOT DETECTED NOT DETECTED Final   Influenza B NOT DETECTED NOT DETECTED Final   Parainfluenza Virus 1 NOT DETECTED NOT DETECTED Final   Parainfluenza Virus 2 NOT DETECTED NOT DETECTED Final   Parainfluenza Virus 3 NOT DETECTED NOT DETECTED Final   Parainfluenza Virus 4 NOT DETECTED NOT DETECTED Final   Respiratory Syncytial Virus NOT DETECTED NOT DETECTED Final   Bordetella pertussis NOT DETECTED NOT DETECTED Final   Chlamydophila pneumoniae NOT DETECTED NOT DETECTED Final   Mycoplasma pneumoniae NOT DETECTED NOT DETECTED Final         Radiology Studies: No results found.      Scheduled Meds: . aspirin EC  81 mg Oral Daily  . atorvastatin  20 mg Oral Daily  . carvedilol  3.125 mg Oral BID WC  . famotidine  20 mg Oral Daily  . heparin  5,000 Units Subcutaneous Q8H  . hydrALAZINE  12.5 mg Oral TID  . iron polysaccharides  150 mg Oral BID  . lactobacillus  1 g Oral TID WC  . levothyroxine  50 mcg Oral QAC breakfast  . lisinopril  10 mg Oral Daily  . loratadine  10 mg Oral Daily  . mometasone-formoterol  2  puff Inhalation BID  . polyvinyl alcohol  1 drop Both Eyes TID  . potassium chloride  10 mEq Oral Daily   Continuous Infusions: . ceFEPime (MAXIPIME) IV 1 g (04/24/17 1756)  . vancomycin Stopped (04/25/17 0546)     LOS: 3 days    Time spent: 40 minutes    Annita Brod, MD [facility] Pager [phone]   If 7PM-7AM, please contact night-coverage [email or web address] Password [email or web address] 04/25/2017, 3:01 PM

## 2017-04-26 ENCOUNTER — Inpatient Hospital Stay (HOSPITAL_COMMUNITY): Payer: Medicare HMO

## 2017-04-26 DIAGNOSIS — R748 Abnormal levels of other serum enzymes: Secondary | ICD-10-CM

## 2017-04-26 DIAGNOSIS — J9601 Acute respiratory failure with hypoxia: Secondary | ICD-10-CM

## 2017-04-26 DIAGNOSIS — N183 Chronic kidney disease, stage 3 (moderate): Secondary | ICD-10-CM

## 2017-04-26 DIAGNOSIS — J189 Pneumonia, unspecified organism: Principal | ICD-10-CM

## 2017-04-26 LAB — CBC WITH DIFFERENTIAL/PLATELET
Basophils Absolute: 0.1 10*3/uL (ref 0.0–0.1)
Basophils Relative: 1 %
EOS ABS: 0.2 10*3/uL (ref 0.0–0.7)
Eosinophils Relative: 2 %
HEMATOCRIT: 32.2 % — AB (ref 36.0–46.0)
HEMOGLOBIN: 9.7 g/dL — AB (ref 12.0–15.0)
LYMPHS ABS: 2.6 10*3/uL (ref 0.7–4.0)
LYMPHS PCT: 20 %
MCH: 25 pg — AB (ref 26.0–34.0)
MCHC: 30.1 g/dL (ref 30.0–36.0)
MCV: 83 fL (ref 78.0–100.0)
MONOS PCT: 8 %
Monocytes Absolute: 1.1 10*3/uL — ABNORMAL HIGH (ref 0.1–1.0)
NEUTROS ABS: 9 10*3/uL — AB (ref 1.7–7.7)
NEUTROS PCT: 69 %
Platelets: 350 10*3/uL (ref 150–400)
RBC: 3.88 MIL/uL (ref 3.87–5.11)
RDW: 15.2 % (ref 11.5–15.5)
WBC: 13 10*3/uL — ABNORMAL HIGH (ref 4.0–10.5)

## 2017-04-26 LAB — BASIC METABOLIC PANEL
ANION GAP: 9 (ref 5–15)
BUN: 17 mg/dL (ref 6–20)
CALCIUM: 9.3 mg/dL (ref 8.9–10.3)
CHLORIDE: 105 mmol/L (ref 101–111)
CO2: 25 mmol/L (ref 22–32)
Creatinine, Ser: 1.2 mg/dL — ABNORMAL HIGH (ref 0.44–1.00)
GFR calc Af Amer: 48 mL/min — ABNORMAL LOW (ref >60)
GFR calc non Af Amer: 41 mL/min — ABNORMAL LOW (ref >60)
GLUCOSE: 91 mg/dL (ref 65–99)
Potassium: 4.2 mmol/L (ref 3.5–5.1)
Sodium: 139 mmol/L (ref 135–145)

## 2017-04-26 LAB — URINALYSIS, ROUTINE W REFLEX MICROSCOPIC
BILIRUBIN URINE: NEGATIVE
Glucose, UA: NEGATIVE mg/dL
HGB URINE DIPSTICK: NEGATIVE
KETONES UR: NEGATIVE mg/dL
LEUKOCYTES UA: NEGATIVE
Nitrite: NEGATIVE
PROTEIN: 100 mg/dL — AB
Specific Gravity, Urine: 1.016 (ref 1.005–1.030)
pH: 5 (ref 5.0–8.0)

## 2017-04-26 LAB — MAGNESIUM: Magnesium: 1.7 mg/dL (ref 1.7–2.4)

## 2017-04-26 MED ORDER — ACETAMINOPHEN 325 MG PO TABS
650.0000 mg | ORAL_TABLET | Freq: Four times a day (QID) | ORAL | Status: DC | PRN
  Administered 2017-04-27 – 2017-04-28 (×3): 650 mg via ORAL
  Filled 2017-04-26 (×3): qty 2

## 2017-04-26 MED ORDER — IPRATROPIUM-ALBUTEROL 0.5-2.5 (3) MG/3ML IN SOLN
3.0000 mL | Freq: Once | RESPIRATORY_TRACT | Status: AC
  Administered 2017-04-26: 3 mL via RESPIRATORY_TRACT

## 2017-04-26 NOTE — Care Management Note (Signed)
Case Management Note  Patient Details  Name: [name] MRN: [id number] Date of Birth: 19-Mar-1934  Subjective/Objective: Pt presented for worsening dyspnea. CT Chest Positive for Multifocal Pneumonia. Initiated on IV antibiotic therapy.  Pt is from [facility]F and will d/c to [place] once stable for d/c.                      Action/Plan: CSW assisting pt with disposition needs. No further needs from CM at this time.   Expected Discharge Date:                  Expected Discharge Plan:  [place]  In-House Referral:  Clinical Social Work  Discharge planning Services  NA  Post Acute Care Choice:  NA Choice offered to:  NA  DME Arranged:  N/A DME Agency:  NA  HH Arranged:  NA HH Agency:  NA  Status of Service:  Completed, signed off  If discussed at [place] of Stay Meetings, dates discussed:    Additional Comments:  Bethena Roys, RN 04/26/2017, 3:30 PM

## 2017-04-26 NOTE — Evaluation (Signed)
Occupational Therapy Evaluation Patient Details Name: ADER FRITZE MRN: [id number] DOB: Dec 01, 1933 Today's Date: 04/26/2017    History of Present Illness 83 y.o.BF PMHx Chronic Systolic CHF (EF 72-09%) combination internal cardiac defibrillator (ICD) and pacemaker, COPD,Asthma, PE, Colon cancer,Colostomy , HLD, Hypothyroidism. Came in with worsening dyspnea, CTA indicative of PNA.   Clinical Impression   Pt was assisted for LB ADL and ambulated with a RW. She was living in an ALF. Pt presents with generalized weakness, decreased balance, poor activity tolerance and impaired memory. Recommending SNF prior to return to ALF setting. Will follow.    Follow Up Recommendations  SNF;Supervision/Assistance - 24 hour    Equipment Recommendations       Recommendations for Other Services       Precautions / Restrictions Precautions Precautions: Fall Restrictions Weight Bearing Restrictions: No Other Position/Activity Restrictions: colostomy      Mobility Bed Mobility Overal bed mobility: Needs Assistance Bed Mobility: Supine to Sit     Supine to sit: Mod assist     General bed mobility comments: assist to raise trunk and advance hips to EOB with pad  Transfers Overall transfer level: Needs assistance Equipment used: 1 person hand held assist Transfers: Sit to/from [employer] Transfers Sit to Stand: Mod assist Stand pivot transfers: Min assist       General transfer comment: assist to rise and shift weight over feet, steadying assist to pivot     Balance Overall balance assessment: Needs assistance Sitting-balance support: No upper extremity supported Sitting balance-Leahy Scale: Fair     Standing balance support: Bilateral upper extremity supported Standing balance-Leahy Scale: Poor                             ADL either performed or assessed with clinical judgement   ADL Overall ADL's : Needs assistance/impaired Eating/Feeding:  Independent;Bed level   Grooming: Wash/dry hands;Wash/dry face;Sitting;Set up   Upper Body Bathing: Minimal assistance;Sitting   Lower Body Bathing: Maximal assistance;Sit to/from stand   Upper Body Dressing : Minimal assistance;Sitting   Lower Body Dressing: Maximal assistance;Sit to/from stand   Toilet Transfer: Minimal assistance;Stand-pivot;BSC   Toileting- Clothing Manipulation and Hygiene: Maximal assistance;Sit to/from stand         General ADL Comments: Pt with urinary incontinence in bed, NT made aware. Assisted with change of clothing and transferred to chair.     Vision Patient Visual Report: No change from baseline       Perception     Praxis      Pertinent Vitals/Pain Pain Assessment: No/denies pain     Hand Dominance Right   Extremity/Trunk Assessment Upper Extremity Assessment Upper Extremity Assessment: Generalized weakness   Lower Extremity Assessment Lower Extremity Assessment: Defer to PT evaluation   Cervical / Trunk Assessment Cervical / Trunk Assessment: Kyphotic   Communication Communication Communication: No difficulties   Cognition Arousal/Alertness: Awake/alert Behavior During Therapy: WFL for tasks assessed/performed Overall Cognitive Status: No family/caregiver present to determine baseline cognitive functioning                                 General Comments: poor memory   General Comments       Exercises     Shoulder Instructions      Home Living Family/patient expects to be discharged to:: Skilled nursing facility  Additional Comments: pt reports that her son arranged for her to start living in an ALF [facility])      Prior Functioning/Environment Level of Independence: Needs assistance  Gait / Transfers Assistance Needed: ambulated with rollator ADL's / Homemaking Assistance Needed: report having had assist for LB bathing and dressing             OT Problem List: Decreased strength;Decreased activity tolerance;Impaired balance (sitting and/or standing);Decreased cognition;Decreased knowledge of use of DME or AE;Obesity      OT Treatment/Interventions: Self-care/ADL training;DME and/or AE instruction;Therapeutic activities;Balance training;Patient/family education    OT Goals(Current goals can be found in the care plan section) Acute Rehab OT Goals Patient Stated Goal: not go back to [facility] OT Goal Formulation: With patient Time For Goal Achievement: 05/10/17 Potential to Achieve Goals: Fair ADL Goals Pt Will Perform Grooming: with set-up;with supervision;sitting (at sink) Pt Will Perform Upper Body Bathing: with supervision;sitting Pt Will Perform Upper Body Dressing: with supervision;sitting Pt Will Transfer to Toilet: ambulating;bedside commode;with min guard assist Pt Will Perform Toileting - Clothing Manipulation and hygiene: with min guard assist;sit to/from stand Additional ADL Goal #1: Pt will perform bed mobility with min assist in preparation for ADL.  OT Frequency: Min 2X/week   Barriers to D/C:            Co-evaluation              AM-PAC PT "6 Clicks" Daily Activity     Outcome Measure Help from another person eating meals?: None Help from another person taking care of personal grooming?: A Little Help from another person toileting, which includes using toliet, bedpan, or urinal?: A Lot Help from another person bathing (including washing, rinsing, drying)?: A Lot Help from another person to put on and taking off regular upper body clothing?: A Little Help from another person to put on and taking off regular lower body clothing?: Total 6 Click Score: 15   End of Session Equipment Utilized During Treatment: Gait belt Nurse Communication:  (NT aware of incontinence, wet bed)  Activity Tolerance: Patient tolerated treatment well Patient left: in chair;with call bell/phone within reach;with  nursing/sitter in room  OT Visit Diagnosis: Unsteadiness on feet (R26.81);Muscle weakness (generalized) (M62.81);Other symptoms and signs involving cognitive function                Time: 1338-1400 OT Time Calculation (min): 22 min Charges:  OT General Charges $OT Visit: 1 Procedure OT Evaluation $OT Eval Moderate Complexity: 1 Procedure G-Codes:      Malka So 04/26/2017, 2:20 PM  [phone]

## 2017-04-26 NOTE — Progress Notes (Signed)
Patient is running a temp of 102.7 as reported by NT, rechecked and it was 102.3, gave her a Vicodan and text paged the MD. Pt is on Vancomycin 1,250 mg IV and Maxipime 1 gm iv, will await a call back and further orders and continue to monitor.

## 2017-04-26 NOTE — Progress Notes (Signed)
OT Cancellation Note  Patient Details Name: [name] MRN: [id number] DOB: 07-30-1934   Cancelled Treatment:    Reason Eval/Treat Not Completed:  (Pt currently working with [place]. Will return.)  Malka So 04/26/2017, 8:45 AM

## 2017-04-26 NOTE — Progress Notes (Signed)
PROGRESS NOTE    [name]  BDZ:[id number] DOB: November 25, 1933 DOA: 04/22/2017 PCP: Reymundo Poll, MD   Brief Narrative:  83 y.o. BF PMHx Chronic Systolic CHF (EF 34-19%) combination internal cardiac defibrillator (ICD) and pacemaker, COPD,Asthma, PE, Colon cancer,Colostomy , HLD, Hypothyroidism   Presents w progressively worsening dyspnea on 7/15.  Pox 70% when EMS arrived,  Placed on non-rebreather and then cpap which brought O2 to 96%. In ED,  CTA chest => negative for PE, but showed multifocal pneumonia and small left pleural effusion. Wbc 9.1,  BUn 26/creatinine 1.26  Trop 0.08;   Pt admitted for acute hypoxic respiratory failure has responded to antibiotics well and able to be weaned off of oxygen.   Subjective: Patient had fever yesterday but otherwise no complaints.   Assessment & Plan:   Active Problems:   Dementia   CKD (chronic kidney disease) stage 3, GFR 30-59 ml/min   Acute respiratory failure (HCC)   Elevated troponin   Pneumonia   HCAP (healthcare-associated pneumonia)  Acute respiratory failure with hypoxia/HCAP Able to be weaned off of oxygen. Speech therapy to check swallow evaluation  -Secondary to multifocal pneumonia, HCAP -Urine strep antigen negative, urine legionella antigen pending -Blood culture x 2, NGTD -Sputum culture pending -Continue current antibiotics per HCAP protocol  -Bipap PRN -PT/OT following, recommending SNF -Social work consult working on SNF --Incentive spirometry and mobilize  Troponin elevation -Likely due to demand ischemia -Asymptomatic  Chronic Systolic and Diastolic CHF  -Echocardiogram 11/2015 LVEF 35-40% , Mild AR, Moderate MS, mild-mod MR: No significant change in EF on this admission to echocardiogram see results below -Coreg 3.125 mg BID -Continue to hold  Lasix 40 mg daily -Hydralazine 12.5 mg TID -Continue decreased lisinopril 10 mg daily secondary to increased Cr -DC Imdur  -Strict I&O since admission -Continue  Daily weight Filed Weights   04/23/17 0456 04/24/17 0330 04/26/17 0500  Weight: 86.4 kg (190 lb 7.6 oz) 87.9 kg (193 lb 12.6 oz) 87.3 kg (192 lb 8 oz)   Pulmonary hypertension -Worsening pulmonary hypertension by echocardiogram -See CHF  Anemia -Transfuse for hemoglobin<8  -7/16 transfuse 1 unit PRBC  Hemoglobin stable for past few days  CKD stage 3 (baseline Cr~1.2) Lab Results  Component Value Date   CREATININE 1.20 (H) 04/26/2017   CREATININE 1.24 (H) 04/25/2017   CREATININE 1.39 (H) 04/24/2017  Maintaining diuresis and creatinine improved to baseline  Hypothyroidism -Cont levothyroxine  COPD -Dulera for Advair   DVT prophylaxis: Subcutaneous heparin Code Status: Full Family Communication: Spoke with son concerned about the patient's current SNF, would like to speak with social work concerning movement of patient to different SNF. Disposition Plan: SNF  Consultants:  None  Procedures/Significant Events:  7/16 Echocardiogram: LVEF = 40% to 45%. Moderate diffuse hypokinesis with regional   variations. - Aortic valve: mild to moderate regurgitation.   - Mitral valve: moderate mitral stenosis  - Pulmonary arteries: PA peak pressure: 65 mm Hg (S).   VENTILATOR SETTINGS:  Cultures 7/15 blood NGTD 7/15 sputum pending 7/15 MRSA by PCR negative 7/15 urine legionella Pending 7/15 urine strep pneumo negative  7/16 respiratory virus panel negative 7/16 transfuse 1 unit PRBC   Antimicrobials: Anti-infectives    Start     Stop   04/23/17 0500  vancomycin (VANCOCIN) 1,250 mg in sodium chloride 0.9 % 250 mL IVPB     04/29/17 2359   04/22/17 1200  ceFEPIme (MAXIPIME) 2 g in dextrose 5 % 50 mL IVPB  04/22/17 0615  ceFEPIme (MAXIPIME) 1 g in dextrose 5 % 50 mL IVPB  Status:  Discontinued     04/22/17 0612   04/22/17 0430  vancomycin (VANCOCIN) 1,500 mg in sodium chloride 0.9 % 500 mL IVPB     04/22/17 0807   04/22/17 0415  piperacillin-tazobactam (ZOSYN)  IVPB 3.375 g     04/22/17 0627    Continuous Infusions: . ceFEPime (MAXIPIME) IV Stopped (04/25/17 2021)  . vancomycin 1,250 mg (04/26/17 0533)   Objective: Vitals:   04/25/17 2149 04/26/17 0500 04/26/17 0657 04/26/17 0915  BP: (!) 145/62 (!) 159/65    Pulse: 99 (!) 108  (!) 105  Resp: 13 (!) 31  (!) 30  Temp: 99.8 F (37.7 C) (!) 102.7 F (39.3 C) 99 F (37.2 C)   TempSrc: Oral Oral Oral   SpO2: 96%   96%  Weight:  87.3 kg (192 lb 8 oz)    Height:        Intake/Output Summary (Last 24 hours) at 04/26/17 1109 Last data filed at 04/26/17 0533  Gross per 24 hour  Intake              880 ml  Output              900 ml  Net              -20 ml   Filed Weights   04/23/17 0456 04/24/17 0330 04/26/17 0500  Weight: 86.4 kg (190 lb 7.6 oz) 87.9 kg (193 lb 12.6 oz) 87.3 kg (192 lb 8 oz)    Examination:  General: awake, sitting up, eating, oriented 3, no acute distress HEENT: Normocephalic, atraumatic, mucous membranes are moist Neck: Supple, no JVD Lungs: Few expiratory crackles, no labored breathing .  Cardiovascular: Regular rate and rhythm Abdomen: Soft, nontender, nondistended, positive bowel sounds  Extremities: No significant cyanosis, clubbing. Positive bilateral lower extremity edema 1+ Skin:No skin breaks, tears or lesions Psychiatry: Flattened affect.  Neuro: No focal deficits   Data Reviewed: Care during the described time interval was provided by me .  I have reviewed this patient's available data, including medical history, events of note, physical examination, and all test results as part of my evaluation. I have personally reviewed and interpreted all radiology studies.  CBC:  Recent Labs Lab 04/22/17 0059 04/23/17 0438 04/23/17 1623 04/24/17 0225 04/25/17 0259 04/26/17 0221  WBC 9.1 12.5* 13.3* 13.1* 10.5 13.0*  NEUTROABS 7.4 10.2*  --  10.1* 7.7 9.0*  HGB 8.5* 7.2* 7.6* 9.0* 9.1* 9.7*  HCT 27.4* 24.2* 25.9* 30.1* 29.7* 32.2*  MCV 82.8 83.7 83.8  83.1 83.2 83.0  PLT 321 285 333 296 335 774   Basic Metabolic Panel:  Recent Labs Lab 04/22/17 0059 04/22/17 1629 04/23/17 0438 04/24/17 0225 04/25/17 0259 04/26/17 0221  NA 135  --  138 139 141 139  K 4.1  --  4.3 4.2 4.2 4.2  CL 105  --  106 107 106 105  CO2 21*  --  23 23 26 25   GLUCOSE 95  --  73 84 87 91  BUN 26*  --  30* 28* 20 17  CREATININE 1.26* 1.45* 1.58* 1.39* 1.24* 1.20*  CALCIUM 9.1  --  8.4* 8.9 9.3 9.3  MG  --   --  1.8 1.8 1.9 1.7   GFR: Estimated Creatinine Clearance: 40.1 mL/min (A) (by C-G formula based on SCr of 1.2 mg/dL (H)). Liver Function Tests:  Recent Labs Lab 04/22/17  0059  AST 114*  ALT 72*  ALKPHOS 81  BILITOT 0.4  PROT 6.8  ALBUMIN 3.6   No results for input(s): LIPASE, AMYLASE in the last 168 hours. No results for input(s): AMMONIA in the last 168 hours. Coagulation Profile: No results for input(s): INR, PROTIME in the last 168 hours. Cardiac Enzymes:  Recent Labs Lab 04/22/17 0059  TROPONINI 0.08*   BNP (last 3 results) No results for input(s): PROBNP in the last 8760 hours. HbA1C: No results for input(s): HGBA1C in the last 72 hours. CBG: No results for input(s): GLUCAP in the last 168 hours. Lipid Profile: No results for input(s): CHOL, HDL, LDLCALC, TRIG, CHOLHDL, LDLDIRECT in the last 72 hours. Thyroid Function Tests: No results for input(s): TSH, T4TOTAL, FREET4, T3FREE, THYROIDAB in the last 72 hours. Anemia Panel:  Recent Labs  04/23/17 1623  VITAMINB12 207  FOLATE 10.9  FERRITIN 68  TIBC 244*  IRON 8*  RETICCTPCT 0.9   Urine analysis:    Component Value Date/Time   COLORURINE YELLOW 02/16/2017 [place] 02/16/2017 1227   LABSPEC 1.017 02/16/2017 1227   PHURINE 5.0 02/16/2017 1227   GLUCOSEU NEGATIVE 02/16/2017 1227   [facility] 02/16/2017 [place] 02/16/2017 1227   KETONESUR NEGATIVE 02/16/2017 1227   PROTEINUR 30 (A) 02/16/2017 1227   UROBILINOGEN 0.2  07/23/2015 1610   NITRITE NEGATIVE 02/16/2017 1227   LEUKOCYTESUR NEGATIVE 02/16/2017 1227    Recent Results (from the past 240 hour(s))  MRSA PCR Screening     Status: None   Collection Time: 04/22/17  4:35 PM  Result Value Ref Range Status   MRSA by PCR NEGATIVE NEGATIVE Final    Comment:        The GeneXpert MRSA Assay (FDA approved for NASAL specimens only), is one component of a comprehensive MRSA colonization surveillance program. It is not intended to diagnose MRSA infection nor to guide or monitor treatment for MRSA infections.   Culture, blood (routine x 2) Call MD if unable to obtain prior to antibiotics being given     Status: None (Preliminary result)   Collection Time: 04/22/17  4:47 PM  Result Value Ref Range Status   Specimen Description BLOOD LEFT HAND  Final   Special Requests   Final    BOTTLES DRAWN AEROBIC ONLY Blood Culture results may not be optimal due to an inadequate volume of blood received in culture bottles   Culture NO GROWTH 4 DAYS  Final   Report Status PENDING  Incomplete  Culture, blood (Routine X 2) w Reflex to ID Panel     Status: None (Preliminary result)   Collection Time: 04/22/17  5:42 PM  Result Value Ref Range Status   Specimen Description BLOOD LEFT HAND  Final   Special Requests IN PEDIATRIC BOTTLE Blood Culture adequate volume  Final   Culture NO GROWTH 4 DAYS  Final   Report Status PENDING  Incomplete  Culture, sputum-assessment     Status: None   Collection Time: 04/22/17  6:04 PM  Result Value Ref Range Status   Specimen Description EXPECTORATED SPUTUM  Final   Special Requests Normal  Final   Sputum evaluation THIS SPECIMEN IS ACCEPTABLE FOR SPUTUM CULTURE  Final   Report Status 04/23/2017 FINAL  Final  Culture, respiratory (NON-Expectorated)     Status: None   Collection Time: 04/22/17  6:04 PM  Result Value Ref Range Status   Specimen Description EXPECTORATED SPUTUM  Final   Special  Requests Normal Reflexed from [id number]   Final   Gram Stain   Final    ABUNDANT WBC PRESENT, PREDOMINANTLY PMN FEW GRAM NEGATIVE COCCOBACILLI FEW GRAM NEGATIVE RODS RARE GRAM POSITIVE COCCI IN CLUSTERS    Culture Consistent with normal respiratory flora.  Final   Report Status 04/25/2017 FINAL  Final  Respiratory Panel by PCR     Status: None   Collection Time: 04/23/17  3:54 PM  Result Value Ref Range Status   Adenovirus NOT DETECTED NOT DETECTED Final   Coronavirus 229E NOT DETECTED NOT DETECTED Final   Coronavirus HKU1 NOT DETECTED NOT DETECTED Final   Coronavirus NL63 NOT DETECTED NOT DETECTED Final   Coronavirus OC43 NOT DETECTED NOT DETECTED Final   Metapneumovirus NOT DETECTED NOT DETECTED Final   Rhinovirus / Enterovirus NOT DETECTED NOT DETECTED Final   Influenza A NOT DETECTED NOT DETECTED Final   Influenza B NOT DETECTED NOT DETECTED Final   Parainfluenza Virus 1 NOT DETECTED NOT DETECTED Final   Parainfluenza Virus 2 NOT DETECTED NOT DETECTED Final   Parainfluenza Virus 3 NOT DETECTED NOT DETECTED Final   Parainfluenza Virus 4 NOT DETECTED NOT DETECTED Final   Respiratory Syncytial Virus NOT DETECTED NOT DETECTED Final   Bordetella pertussis NOT DETECTED NOT DETECTED Final   Chlamydophila pneumoniae NOT DETECTED NOT DETECTED Final   Mycoplasma pneumoniae NOT DETECTED NOT DETECTED Final   Radiology Studies: Dg Chest 1 View  Result Date: 04/26/2017 CLINICAL DATA:  Shortness of Breath EXAM: CHEST 1 VIEW COMPARISON:  04/22/2017 FINDINGS: Cardiac shadow remains enlarged. A defibrillator is again seen. The overall inspiratory effort is poor. Elevation of the hemidiaphragm on the right is again seen. Vascular congestion is noted without sizable effusion. Persistent patchy changes in the left base are seen. IMPRESSION: No significant change from the prior exam. Electronically Signed   By: Inez Catalina M.D.   On: 04/26/2017 08:22  Scheduled Meds: . aspirin EC  81 mg Oral Daily  . atorvastatin  20 mg Oral Daily  .  carvedilol  3.125 mg Oral BID WC  . famotidine  20 mg Oral Daily  . heparin  5,000 Units Subcutaneous Q8H  . hydrALAZINE  12.5 mg Oral TID  . iron polysaccharides  150 mg Oral BID  . lactobacillus  1 g Oral TID WC  . levothyroxine  50 mcg Oral QAC breakfast  . lisinopril  10 mg Oral Daily  . loratadine  10 mg Oral Daily  . mometasone-formoterol  2 puff Inhalation BID  . polyvinyl alcohol  1 drop Both Eyes TID  . potassium chloride  10 mEq Oral Daily   Continuous Infusions: . ceFEPime (MAXIPIME) IV Stopped (04/25/17 2021)  . vancomycin 1,250 mg (04/26/17 0533)     LOS: 4 days   Time spent: 34 minutes  Irwin Brakeman, MD [facility] Pager [phone]   If 7PM-7AM, please contact night-coverage [email or web address] Password [email or web address] 04/26/2017, 11:09 AM

## 2017-04-26 NOTE — Care Management Important Message (Signed)
Important Message  Patient Details  Name: [name] MRN: [id number] Date of Birth: 05/23/1934   Medicare Important Message Given:  Yes    Marquisha Nikolov Abena 04/26/2017, 11:26 AM

## 2017-04-26 NOTE — Progress Notes (Signed)
CSW continues to follow patient. CSW talked to patient's son and legal guardian, [name], via phone. Son agreeable to patient discharge to SNF and chooses [facility]. CSW started [facility] authorization for [facility] at 1 pm 7/19. Patient's son indicated he has chosen a new ALF for patient when she is ready to leave SNF. Patient was at [facility] but will move to [facility]. Son has made arrangements for this switch. CSW will support with discharge to [facility] when patient medically stable.  Estanislado Emms, [place]

## 2017-04-26 NOTE — Evaluation (Signed)
Clinical/Bedside Swallow Evaluation Patient Details  Name: [name] MRN: [id number] Date of Birth: 05-May-1934  Today's Date: 04/26/2017 Time: SLP Start Time (ACUTE ONLY): 0840 SLP Stop Time (ACUTE ONLY): 0900 SLP Time Calculation (min) (ACUTE ONLY): 20 min  Past Medical History:  Past Medical History:  Diagnosis Date  . Anginal pain ([place])   . Arthritis    "comes and goes" (03/01/2015)  . Asthma   . CHF (congestive heart failure) ([place])   . Colon cancer ([place])   . Colostomy care ([place])   . COPD (chronic obstructive pulmonary disease) ([place])   . Diabetes mellitus without complication ([place])    pt denies this hx on 03/01/2015  . Heart murmur   . History of blood transfusion 03/01/2015; 04/23/2017   "related to anemia,"  . Hyperlipidemia   . Hypertension   . Hypothyroidism   . Pneumonia    "just once" (03/01/2015)  . Pneumonia 04/23/2017  . Presence of combination internal cardiac defibrillator (ICD) and pacemaker    2005 in [facility]  . Pulmonary embolism ([place])   . Renal disorder    Past Surgical History:  Past Surgical History:  Procedure Laterality Date  . ABDOMINAL HYSTERECTOMY    . ANKLE FRACTURE SURGERY Left   . CARDIAC CATHETERIZATION N/A 03/22/2015   Procedure: Right/Left Heart Cath and Coronary Angiography;  Surgeon: Leonie Man, MD;  Location: [facility];  Service: Cardiovascular;  Laterality: N/A;  . CATARACT EXTRACTION W/ INTRAOCULAR LENS  IMPLANT, BILATERAL Bilateral   . CHOLECYSTECTOMY    . COLON SURGERY     cancer  . DILATION AND CURETTAGE OF UTERUS    . FRACTURE SURGERY    . TUBAL LIGATION     HPI:  83 y.o. PMHx Chronic Systolic CHF, combination internal cardiac defibrillator (ICD) and pacemaker, COPD,Asthma, PE, Colon cancer,Colostomy , HLD, Hypothyroidism. Came in with worsening dyspnea, CT chest indicative of PNA. BSE 02/02/17 WNL's and recommended regular texture and thin liquids.    Assessment / Plan / Recommendation Clinical Impression    SLP  suspects primary esophageal dysphagia evidenced by eructation, effortful swallow/grimace and slight gurgly sound during swallow (more UES suspected) especially when taking pills with thin (later reported pills are crushed at ALF. Throat clear x 1. Pt may be experiencing statis at UES. Educated re: esophageal precautions (sit upright, eat at slower rate/rest breaks for esophageal transit, remain upright 30 mn after meals). Recommend continue regular diet, thin liquids, straws allowed, crush pillls and esophageal precautions. She may benefit from further esophageal work up. No records of prior admissions for pna or pt report. If she has future admissions for pna a MBS may be warranted. No further ST SLP Visit Diagnosis: Dysphagia, unspecified (R13.10)    Aspiration Risk  Mild aspiration risk    Diet Recommendation Regular;Thin liquid   Liquid Administration via: Straw;Cup Medication Administration: Crushed with puree Compensations: Slow rate;Small sips/bites;Follow solids with liquid Postural Changes: Seated upright at 90 degrees;Remain upright for at least 30 minutes after po intake    Other  Recommendations Oral Care Recommendations: Oral care BID   Follow up Recommendations None      Frequency and Duration            Prognosis        Swallow Study   General HPI: 83 y.o. PMHx Chronic Systolic CHF, combination internal cardiac defibrillator (ICD) and pacemaker, COPD,Asthma, PE, Colon cancer,Colostomy , HLD, Hypothyroidism. Came in with worsening dyspnea, CT chest indicative of PNA. BSE  02/02/17 WNL's and recommended regular texture and thin liquids.  Type of Study: Bedside Swallow Evaluation Previous Swallow Assessment:  (see HPI) Diet Prior to this Study: Regular;Thin liquids Temperature Spikes Noted: No Respiratory Status: Room air History of Recent Intubation: No Behavior/Cognition: Alert;Cooperative;Pleasant mood;Requires cueing Oral Cavity Assessment: Within Functional  Limits Oral Care Completed by SLP: No Oral Cavity - Dentition: Adequate natural dentition (no lower) Vision: Functional for self-feeding Self-Feeding Abilities: Able to feed self Patient Positioning: Upright in bed Baseline Vocal Quality: Normal Volitional Cough: Strong Volitional Swallow: Able to elicit    Oral/Motor/Sensory Function Overall Oral Motor/Sensory Function: Mild impairment Lingual Symmetry: Abnormal symmetry right   Ice Chips Ice chips: Not tested   Thin Liquid Thin Liquid: Impaired Presentation: Straw Oral Phase Impairments:  (none) Oral Phase Functional Implications:  (none) Pharyngeal  Phase Impairments: Throat Clearing - Immediate;Multiple swallows (effortful swallow/grimace)    Nectar Thick Nectar Thick Liquid: Not tested   Honey Thick Honey Thick Liquid: Not tested   Puree Puree: Impaired Presentation: Spoon Oral Phase Impairments:  (none) Oral Phase Functional Implications:  (none) Pharyngeal Phase Impairments: Multiple swallows (effortful swallow/grimace)   Solid   GO   Solid: Impaired Presentation: Self Fed Oral Phase Impairments:  (none) Oral Phase Functional Implications:  (none) Pharyngeal Phase Impairments:  (effortful swallow/grimace)        Houston Siren 04/26/2017,9:24 AM  Orbie Pyo Colvin Caroli.Ed [employer] [phone]

## 2017-04-26 NOTE — [place] FL2 (Signed)
[place] MEDICAID FL2 LEVEL OF CARE SCREENING TOOL     IDENTIFICATION  Patient Name: [name] Birthdate: Jul 28, 1934 Sex: female Admission Date (Current Location): 04/22/2017  [facility] and [place] Number:  Herbalist and Address:  [facility] [place]. [facility], [place] [address], [place], [place] [address]      Provider Number: [id number]  Attending Physician Name and Address:  Murlean Iba, MD  Relative Name and Phone Number:  Leatha Gilding, [phone]     Current Level of Care: Hospital Recommended Level of Care: [place] Prior Approval Number:    Date Approved/Denied:   PASRR Number: [id number]  Discharge Plan: SNF    Current Diagnoses: Patient Active Problem List   Diagnosis Date Noted  . Pneumonia 04/22/2017  . HCAP (healthcare-associated pneumonia) 04/22/2017  . Altered mental status   . Hypotension 02/16/2017  . Syncope 02/02/2016  . Faintness   . Essential hypertension 11/16/2015  . Hypothyroidism 11/16/2015  . Acute on chronic systolic (congestive) heart failure ([place]) 11/15/2015  . Hypertensive heart disease 10/04/2015  . Elevated troponin 10/04/2015  . Acute respiratory failure ([place])   . Acute systolic congestive heart failure ([place])   . NICM (nonischemic cardiomyopathy) ([place]) 08/11/2015  . Normal coronary arteries 08/11/2015  . Dyslipidemia 07/27/2015  . CKD (chronic kidney disease) stage 3, GFR 30-59 ml/min 07/27/2015  . Anemia 07/27/2015  . Benign essential HTN 07/27/2015  . Hypothyroidism, adult 07/27/2015  . FUO (fever of unknown origin) 07/27/2015  . IDA (iron deficiency anemia) 07/27/2015  . Chronic systolic CHF (congestive heart failure) ([place]) 07/27/2015  . Colon cancer ([place]) 07/27/2015  . Sepsis ([place]) 07/24/2015  . Acute encephalopathy 07/24/2015  . Hypokalemia 07/24/2015  . Dementia 07/23/2015  . COPD (chronic obstructive pulmonary disease) ([place]) 04/25/2015  . ICD in place 04/14/2015  .  Colostomy in place [facility]) 03/01/2015    Orientation RESPIRATION BLADDER Height & Weight     Self, Time, Situation, Place  Normal Incontinent Weight: 192 lb 8 oz (87.3 kg) Height:  5\' 5"  (165.1 cm)  BEHAVIORAL SYMPTOMS/MOOD NEUROLOGICAL BOWEL NUTRITION STATUS      Colostomy Diet (heart; please see DC summary)  AMBULATORY STATUS COMMUNICATION OF NEEDS Skin   Limited Assist Verbally Other (Comment) (moisture associated skin damage, buttocks)                       Personal Care Assistance Level of Assistance  Bathing, Feeding, Dressing Bathing Assistance: Limited assistance Feeding assistance: Independent Dressing Assistance: Limited assistance     Functional Limitations Info  Sight, Hearing, Speech Sight Info: Adequate Hearing Info: Adequate Speech Info: Adequate    SPECIAL CARE FACTORS FREQUENCY  PT (By licensed PT)     PT Frequency: 5x/week              Contractures Contractures Info: Not present    Additional Factors Info  Code Status, Allergies Code Status Info: Full Allergies Info: No Known Allergies           Current Medications (04/26/2017):  This is the current hospital active medication list Current Facility-Administered Medications  Medication Dose Route Frequency Provider Last Rate Last Dose  . acetaminophen (TYLENOL) tablet 650 mg  650 mg Oral Q6H PRN Blount, Xenia T, NP      . albuterol (PROVENTIL) (2.5 MG/3ML) 0.083% nebulizer solution 2.5 mg  2.5 mg Nebulization Q4H PRN Jani Gravel, MD   2.5 mg at 04/23/17 0510  . aspirin EC  tablet 81 mg  81 mg Oral Daily Jani Gravel, MD   81 mg at 04/26/17 0845  . atorvastatin (LIPITOR) tablet 20 mg  20 mg Oral Daily Jani Gravel, MD   20 mg at 04/26/17 0845  . benzonatate (TESSALON) capsule 100 mg  100 mg Oral TID PRN Jani Gravel, MD   100 mg at 04/26/17 0537  . carvedilol (COREG) tablet 3.125 mg  3.125 mg Oral BID WC Jani Gravel, MD   3.125 mg at 04/26/17 0845  . ceFEPIme (MAXIPIME) 1 g in dextrose 5 % 50 mL IVPB   1 g Intravenous Q24H Jaquita Folds, RPH   Stopped at 04/25/17 2021  . famotidine (PEPCID) tablet 20 mg  20 mg Oral Daily Jani Gravel, MD   20 mg at 04/26/17 0845  . guaiFENesin-codeine 100-10 MG/5ML solution 10 mL  10 mL Oral QHS PRN Allie Bossier, MD   10 mL at 04/25/17 2106  . heparin injection 5,000 Units  5,000 Units Subcutaneous Q8H Jani Gravel, MD   5,000 Units at 04/26/17 0532  . hydrALAZINE (APRESOLINE) tablet 12.5 mg  12.5 mg Oral TID Jani Gravel, MD   12.5 mg at 04/26/17 0845  . HYDROcodone-acetaminophen (NORCO/VICODIN) 5-325 MG per tablet 1 tablet  1 tablet Oral Q4H PRN Schorr, Rhetta Mura, NP   1 tablet at 04/26/17 0532  . iron polysaccharides (NIFEREX) capsule 150 mg  150 mg Oral BID Jani Gravel, MD   150 mg at 04/26/17 0845  . lactobacillus (FLORANEX/LACTINEX) granules 1 g  1 g Oral TID WC Jani Gravel, MD   1 g at 04/26/17 0844  . levothyroxine (SYNTHROID, LEVOTHROID) tablet 50 mcg  50 mcg Oral QAC breakfast Jani Gravel, MD   50 mcg at 04/26/17 0656  . lisinopril (PRINIVIL,ZESTRIL) tablet 10 mg  10 mg Oral Daily Allie Bossier, MD   10 mg at 04/26/17 0845  . loratadine (CLARITIN) tablet 10 mg  10 mg Oral Daily Jani Gravel, MD   10 mg at 04/26/17 0844  . mometasone-formoterol (DULERA) 200-5 MCG/ACT inhaler 2 puff  2 puff Inhalation BID Jani Gravel, MD   2 puff at 04/25/17 2243  . polyvinyl alcohol (LIQUIFILM TEARS) 1.4 % ophthalmic solution 1 drop  1 drop Both Eyes TID Jani Gravel, MD   1 drop at 04/25/17 2105  . potassium chloride (K-DUR,KLOR-CON) CR tablet 10 mEq  10 mEq Oral Daily Jani Gravel, MD   10 mEq at 04/26/17 0845  . vancomycin (VANCOCIN) 1,250 mg in sodium chloride 0.9 % 250 mL IVPB  1,250 mg Intravenous Q24H Dang, Thuy D, RPH 166.7 mL/hr at 04/26/17 0533 1,250 mg at 04/26/17 0533     Discharge Medications: Please see discharge summary for a list of discharge medications.  Relevant Imaging Results:  Relevant Lab Results:   Additional Information SSN: [id number]  Estanislado Emms, LCSW

## 2017-04-27 LAB — BASIC METABOLIC PANEL
ANION GAP: 8 (ref 5–15)
BUN: 19 mg/dL (ref 6–20)
CALCIUM: 9.1 mg/dL (ref 8.9–10.3)
CO2: 24 mmol/L (ref 22–32)
Chloride: 109 mmol/L (ref 101–111)
Creatinine, Ser: 1.25 mg/dL — ABNORMAL HIGH (ref 0.44–1.00)
GFR calc Af Amer: 45 mL/min — ABNORMAL LOW (ref >60)
GFR calc non Af Amer: 39 mL/min — ABNORMAL LOW (ref >60)
GLUCOSE: 90 mg/dL (ref 65–99)
Potassium: 4.3 mmol/L (ref 3.5–5.1)
Sodium: 141 mmol/L (ref 135–145)

## 2017-04-27 LAB — CBC WITH DIFFERENTIAL/PLATELET
BASOS PCT: 1 %
Basophils Absolute: 0.1 10*3/uL (ref 0.0–0.1)
EOS PCT: 2 %
Eosinophils Absolute: 0.2 10*3/uL (ref 0.0–0.7)
HEMATOCRIT: 28.1 % — AB (ref 36.0–46.0)
Hemoglobin: 8.5 g/dL — ABNORMAL LOW (ref 12.0–15.0)
LYMPHS PCT: 23 %
Lymphs Abs: 2.7 10*3/uL (ref 0.7–4.0)
MCH: 25.4 pg — ABNORMAL LOW (ref 26.0–34.0)
MCHC: 30.2 g/dL (ref 30.0–36.0)
MCV: 83.9 fL (ref 78.0–100.0)
MONOS PCT: 9 %
Monocytes Absolute: 1 10*3/uL (ref 0.1–1.0)
NEUTROS PCT: 65 %
Neutro Abs: 7.6 10*3/uL (ref 1.7–7.7)
PLATELETS: 341 10*3/uL (ref 150–400)
RBC: 3.35 MIL/uL — ABNORMAL LOW (ref 3.87–5.11)
RDW: 15.3 % (ref 11.5–15.5)
WBC: 11.6 10*3/uL — ABNORMAL HIGH (ref 4.0–10.5)

## 2017-04-27 LAB — CULTURE, BLOOD (ROUTINE X 2)
CULTURE: NO GROWTH
CULTURE: NO GROWTH
Special Requests: ADEQUATE

## 2017-04-27 LAB — LEGIONELLA PNEUMOPHILA SEROGP 1 UR AG: L. PNEUMOPHILA SEROGP 1 UR AG: NEGATIVE

## 2017-04-27 LAB — MAGNESIUM: Magnesium: 1.7 mg/dL (ref 1.7–2.4)

## 2017-04-27 MED ORDER — DOXYCYCLINE HYCLATE 100 MG PO TABS
100.0000 mg | ORAL_TABLET | Freq: Two times a day (BID) | ORAL | Status: DC
  Administered 2017-04-27 – 2017-04-29 (×5): 100 mg via ORAL
  Filled 2017-04-27 (×5): qty 1

## 2017-04-27 NOTE — Progress Notes (Signed)
CSW received call from [facility] confirming authorization for SNF. Auth approval reference [id number]. Patient's son chooses [facility] and [facility] also has [place] information. CSW informed patient's son of authorization and tentative plan to discharge tomorrow, per MD. CSW met with patient at bedside and patient agreeable to discharge plan. CSW will continue to follow and support with discharge.   Estanislado Emms, [place]

## 2017-04-27 NOTE — Progress Notes (Signed)
PROGRESS NOTE   [name]  VHQ:[id number] DOB: [birth year] DOA: 04/22/2017 PCP: Reymundo Poll, MD   Brief Narrative:  83 y.o. BF PMHx Chronic Systolic CHF (EF 41-32%) combination internal cardiac defibrillator (ICD) and pacemaker, COPD,Asthma, PE, Colon cancer,Colostomy , HLD, Hypothyroidism   Presents w progressively worsening dyspnea on 7/15.  Pox 70% when EMS arrived,  Placed on non-rebreather and then cpap which brought O2 to 96%. In ED,  CTA chest => negative for PE, but showed multifocal pneumonia and small left pleural effusion. Wbc 9.1,  BUn 26/creatinine 1.26  Trop 0.08;   Pt admitted for acute hypoxic respiratory failure has responded to antibiotics well and able to be weaned off of oxygen.   Subjective: Patient says that she does feel a little better today.    Assessment & Plan:   Active Problems:   Dementia   CKD (chronic kidney disease) stage 3, GFR 30-59 ml/min   Acute respiratory failure (HCC)   Elevated troponin   Pneumonia   HCAP (healthcare-associated pneumonia)  Acute respiratory failure with hypoxia/HCAP Able to be weaned off of oxygen. Speech therapy to check swallow evaluation  -Secondary to multifocal pneumonia, HCAP -Urine strep antigen negative, urine legionella antigen pending -Blood culture x 2, NGTD -Sputum culture pending -Continue current antibiotics per HCAP protocol  -Bipap PRN -PT/OT following, recommending SNF -Social work consult working on SNF, bed is ready when patient medically ready for dc --Incentive spirometry and mobilize  Troponin elevation -Likely due to demand ischemia -Asymptomatic   Chronic Systolic and Diastolic CHF  -Echocardiogram 11/2015 LVEF 35-40% , Mild AR, Moderate MS, mild-mod MR: No significant change in EF on this admission to echocardiogram see results below -Coreg 3.125 mg BID -Continue to hold  Lasix 40 mg daily -Hydralazine 12.5 mg TID -Continue decreased lisinopril 10 mg daily secondary to increased Cr -DC  Imdur  -Strict I&O since admission -Continue Daily weight Filed Weights   04/24/17 0330 04/26/17 0500 04/27/17 0500  Weight: 87.9 kg (193 lb 12.6 oz) 87.3 kg (192 lb 8 oz) 88.1 kg (194 lb 3.6 oz)   Pulmonary hypertension -Worsening pulmonary hypertension by echocardiogram -See CHF  Anemia -Transfuse for hemoglobin<8  -7/16 transfused 1 unit PRBC   CKD stage 3 (baseline Cr~1.2) Lab Results  Component Value Date   CREATININE 1.25 (H) 04/27/2017   CREATININE 1.20 (H) 04/26/2017   CREATININE 1.24 (H) 04/25/2017  Maintaining diuresis and creatinine improved to baseline  Hypothyroidism -Cont levothyroxine  COPD -Dulera for Advair   DVT prophylaxis: Subcutaneous heparin Code Status: Full Family Communication: Spoke with son concerned about the patient's current SNF, would like to speak with social work concerning movement of patient to different ALF. Disposition Plan: SNF  Consultants:  None  Procedures/Significant Events:  7/16 Echocardiogram: LVEF = 40% to 45%. Moderate diffuse hypokinesis with regional   variations. - Aortic valve: mild to moderate regurgitation.   - Mitral valve: moderate mitral stenosis  - Pulmonary arteries: PA peak pressure: 65 mm Hg (S).   VENTILATOR SETTINGS:  Cultures 7/15 blood NGTD 7/15 sputum pending 7/15 MRSA by PCR negative 7/15 urine legionella Pending 7/15 urine strep pneumo negative  7/16 respiratory virus panel negative 7/16 transfuse 1 unit PRBC   Antimicrobials: Anti-infectives    Start     Stop   04/23/17 0500  vancomycin (VANCOCIN) 1,250 mg in sodium chloride 0.9 % 250 mL IVPB     04/29/17 2359   04/22/17 1200  ceFEPIme (MAXIPIME) 2 g in dextrose 5 % 50  mL IVPB         04/22/17 0615  ceFEPIme (MAXIPIME) 1 g in dextrose 5 % 50 mL IVPB  Status:  Discontinued     04/22/17 0612   04/22/17 0430  vancomycin (VANCOCIN) 1,500 mg in sodium chloride 0.9 % 500 mL IVPB     04/22/17 0807   04/22/17 0415  piperacillin-tazobactam  (ZOSYN) IVPB 3.375 g     04/22/17 0627    Continuous Infusions:  Objective: Vitals:   04/27/17 0500 04/27/17 0822 04/27/17 0903 04/27/17 0957  BP: (!) 152/71 140/63  (!) 144/60  Pulse: (!) 104 95    Resp:      Temp: (!) 100.7 F (38.2 C)     TempSrc: Oral     SpO2: 95%  95%   Weight: 88.1 kg (194 lb 3.6 oz)     Height:        Intake/Output Summary (Last 24 hours) at 04/27/17 1139 Last data filed at 04/27/17 9326  Gross per 24 hour  Intake              710 ml  Output                0 ml  Net              710 ml   Filed Weights   04/24/17 0330 04/26/17 0500 04/27/17 0500  Weight: 87.9 kg (193 lb 12.6 oz) 87.3 kg (192 lb 8 oz) 88.1 kg (194 lb 3.6 oz)    Examination:  General: awake, sitting up, eating, oriented 3, no acute distress HEENT: Normocephalic, atraumatic, mucous membranes are moist Neck: Supple, no JVD Lungs: Few expiratory crackles, no labored breathing .  Cardiovascular: Regular rate and rhythm Abdomen: Soft, nontender, nondistended, positive bowel sounds  Extremities: No significant cyanosis, clubbing. Positive bilateral lower extremity edema 1+ Skin:No skin breaks, tears or lesions Psychiatry: Flattened affect.  Neuro: No focal deficits  Data Reviewed: Care during the described time interval was provided by me .  I have reviewed this patient's available data, including medical history, events of note, physical examination, and all test results as part of my evaluation. I have personally reviewed and interpreted all radiology studies.  CBC:  Recent Labs Lab 04/23/17 0438 04/23/17 1623 04/24/17 0225 04/25/17 0259 04/26/17 0221 04/27/17 0305  WBC 12.5* 13.3* 13.1* 10.5 13.0* 11.6*  NEUTROABS 10.2*  --  10.1* 7.7 9.0* 7.6  HGB 7.2* 7.6* 9.0* 9.1* 9.7* 8.5*  HCT 24.2* 25.9* 30.1* 29.7* 32.2* 28.1*  MCV 83.7 83.8 83.1 83.2 83.0 83.9  PLT 285 333 296 335 350 712   Basic Metabolic Panel:  Recent Labs Lab 04/23/17 0438 04/24/17 0225  04/25/17 0259 04/26/17 0221 04/27/17 0305  NA 138 139 141 139 141  K 4.3 4.2 4.2 4.2 4.3  CL 106 107 106 105 109  CO2 23 23 26 25 24   GLUCOSE 73 84 87 91 90  BUN 30* 28* 20 17 19   CREATININE 1.58* 1.39* 1.24* 1.20* 1.25*  CALCIUM 8.4* 8.9 9.3 9.3 9.1  MG 1.8 1.8 1.9 1.7 1.7   GFR: Estimated Creatinine Clearance: 38.7 mL/min (A) (by C-G formula based on SCr of 1.25 mg/dL (H)). Liver Function Tests:  Recent Labs Lab 04/22/17 0059  AST 114*  ALT 72*  ALKPHOS 81  BILITOT 0.4  PROT 6.8  ALBUMIN 3.6   No results for input(s): LIPASE, AMYLASE in the last 168 hours. No results for input(s): AMMONIA in the last 168 hours.  Coagulation Profile: No results for input(s): INR, PROTIME in the last 168 hours. Cardiac Enzymes:  Recent Labs Lab 04/22/17 0059  TROPONINI 0.08*   BNP (last 3 results) No results for input(s): PROBNP in the last 8760 hours. HbA1C: No results for input(s): HGBA1C in the last 72 hours. CBG: No results for input(s): GLUCAP in the last 168 hours. Lipid Profile: No results for input(s): CHOL, HDL, LDLCALC, TRIG, CHOLHDL, LDLDIRECT in the last 72 hours. Thyroid Function Tests: No results for input(s): TSH, T4TOTAL, FREET4, T3FREE, THYROIDAB in the last 72 hours. Anemia Panel: No results for input(s): VITAMINB12, FOLATE, FERRITIN, TIBC, IRON, RETICCTPCT in the last 72 hours. Urine analysis:    Component Value Date/Time   COLORURINE YELLOW 04/26/2017 1406   APPEARANCEUR HAZY (A) 04/26/2017 1406   LABSPEC 1.016 04/26/2017 1406   PHURINE 5.0 04/26/2017 1406   GLUCOSEU NEGATIVE 04/26/2017 1406   HGBUR NEGATIVE 04/26/2017 1406   BILIRUBINUR NEGATIVE 04/26/2017 1406   KETONESUR NEGATIVE 04/26/2017 1406   PROTEINUR 100 (A) 04/26/2017 1406   UROBILINOGEN 0.2 07/23/2015 1610   NITRITE NEGATIVE 04/26/2017 1406   LEUKOCYTESUR NEGATIVE 04/26/2017 1406    Recent Results (from the past 240 hour(s))  MRSA PCR Screening     Status: None   Collection Time:  04/22/17  4:35 PM  Result Value Ref Range Status   MRSA by PCR NEGATIVE NEGATIVE Final    Comment:        The GeneXpert MRSA Assay (FDA approved for NASAL specimens only), is one component of a comprehensive MRSA colonization surveillance program. It is not intended to diagnose MRSA infection nor to guide or monitor treatment for MRSA infections.   Culture, blood (routine x 2) Call MD if unable to obtain prior to antibiotics being given     Status: None   Collection Time: 04/22/17  4:47 PM  Result Value Ref Range Status   Specimen Description BLOOD LEFT HAND  Final   Special Requests   Final    BOTTLES DRAWN AEROBIC ONLY Blood Culture results may not be optimal due to an inadequate volume of blood received in culture bottles   Culture NO GROWTH 5 DAYS  Final   Report Status 04/27/2017 FINAL  Final  Culture, blood (Routine X 2) w Reflex to ID Panel     Status: None   Collection Time: 04/22/17  5:42 PM  Result Value Ref Range Status   Specimen Description BLOOD LEFT HAND  Final   Special Requests IN PEDIATRIC BOTTLE Blood Culture adequate volume  Final   Culture NO GROWTH 5 DAYS  Final   Report Status 04/27/2017 FINAL  Final  Culture, sputum-assessment     Status: None   Collection Time: 04/22/17  6:04 PM  Result Value Ref Range Status   Specimen Description EXPECTORATED SPUTUM  Final   Special Requests Normal  Final   Sputum evaluation THIS SPECIMEN IS ACCEPTABLE FOR SPUTUM CULTURE  Final   Report Status 04/23/2017 FINAL  Final  Culture, respiratory (NON-Expectorated)     Status: None   Collection Time: 04/22/17  6:04 PM  Result Value Ref Range Status   Specimen Description EXPECTORATED SPUTUM  Final   Special Requests Normal Reflexed from [id number]  Final   Gram Stain   Final    ABUNDANT WBC PRESENT, PREDOMINANTLY PMN FEW GRAM NEGATIVE COCCOBACILLI FEW GRAM NEGATIVE RODS RARE GRAM POSITIVE COCCI IN CLUSTERS    Culture Consistent with normal respiratory flora.  Final    Report Status 04/25/2017 FINAL  Final  Respiratory Panel by PCR     Status: None   Collection Time: 04/23/17  3:54 PM  Result Value Ref Range Status   Adenovirus NOT DETECTED NOT DETECTED Final   Coronavirus 229E NOT DETECTED NOT DETECTED Final   Coronavirus HKU1 NOT DETECTED NOT DETECTED Final   Coronavirus NL63 NOT DETECTED NOT DETECTED Final   Coronavirus OC43 NOT DETECTED NOT DETECTED Final   Metapneumovirus NOT DETECTED NOT DETECTED Final   Rhinovirus / Enterovirus NOT DETECTED NOT DETECTED Final   Influenza A NOT DETECTED NOT DETECTED Final   Influenza B NOT DETECTED NOT DETECTED Final   Parainfluenza Virus 1 NOT DETECTED NOT DETECTED Final   Parainfluenza Virus 2 NOT DETECTED NOT DETECTED Final   Parainfluenza Virus 3 NOT DETECTED NOT DETECTED Final   Parainfluenza Virus 4 NOT DETECTED NOT DETECTED Final   Respiratory Syncytial Virus NOT DETECTED NOT DETECTED Final   Bordetella pertussis NOT DETECTED NOT DETECTED Final   Chlamydophila pneumoniae NOT DETECTED NOT DETECTED Final   Mycoplasma pneumoniae NOT DETECTED NOT DETECTED Final   Radiology Studies: Dg Chest 1 View  Result Date: 04/26/2017 CLINICAL DATA:  Shortness of Breath EXAM: CHEST 1 VIEW COMPARISON:  04/22/2017 FINDINGS: Cardiac shadow remains enlarged. A defibrillator is again seen. The overall inspiratory effort is poor. Elevation of the hemidiaphragm on the right is again seen. Vascular congestion is noted without sizable effusion. Persistent patchy changes in the left base are seen. IMPRESSION: No significant change from the prior exam. Electronically Signed   By: Inez Catalina M.D.   On: 04/26/2017 08:22  Scheduled Meds: . aspirin EC  81 mg Oral Daily  . atorvastatin  20 mg Oral Daily  . carvedilol  3.125 mg Oral BID WC  . doxycycline  100 mg Oral Q12H  . famotidine  20 mg Oral Daily  . heparin  5,000 Units Subcutaneous Q8H  . hydrALAZINE  12.5 mg Oral TID  . iron polysaccharides  150 mg Oral BID  . lactobacillus   1 g Oral TID WC  . levothyroxine  50 mcg Oral QAC breakfast  . lisinopril  10 mg Oral Daily  . loratadine  10 mg Oral Daily  . mometasone-formoterol  2 puff Inhalation BID  . polyvinyl alcohol  1 drop Both Eyes TID  . potassium chloride  10 mEq Oral Daily   Continuous Infusions:   LOS: 5 days   Time spent: 27 minutes  Irwin Brakeman, MD [facility] Pager [phone]   If 7PM-7AM, please contact night-coverage [email or web address] Password [facility] 04/27/2017, 11:39 AM

## 2017-04-27 NOTE — Progress Notes (Signed)
Pharmacy Antibiotic Note  [name] is a 83 y.o. female admitted on 04/22/2017 with pneumonia.  Pharmacy has been consulted for vancomycin and cefepime dosing.  Patient's renal stabilizing at 1.2.  She remains afebrile with tmax 100.7 and her WBC is trending down at 11. Cultures remain no growth/normal flora   Plan: Increase vanc back to 1250mg  IV Q24H, 8 days total Continue cefepime 1gm IV Q24H Will consider checking vancomycin level in am 7/21 if not stopped   Height: 5\' 5"  (165.1 cm) Weight: 194 lb 3.6 oz (88.1 kg) IBW/kg (Calculated) : 57  Temp (24hrs), Avg:99.5 F (37.5 C), Min:98.5 F (36.9 C), Max:100.7 F (38.2 C)   Recent Labs Lab 04/22/17 0559  04/23/17 0438 04/23/17 1623 04/24/17 0225 04/25/17 0259 04/26/17 0221 04/27/17 0305  WBC  --   --  12.5* 13.3* 13.1* 10.5 13.0* 11.6*  CREATININE  --   < > 1.58*  --  1.39* 1.24* 1.20* 1.25*  LATICACIDVEN 1.30  --   --   --   --   --   --   --   < > = values in this interval not displayed.  Estimated Creatinine Clearance: 38.7 mL/min (A) (by C-G formula based on SCr of 1.25 mg/dL (H)).    No Known Allergies   Vanc 7/15 >> (7/22) Cefepime 7/15 >>  7/15 sputum - normal flora 7/15 BCx - NG 7/16 resp PCR - none detected  Erin Hearing PharmD., BCPS Clinical Pharmacist Pager [phone] 04/27/2017 8:54 AM

## 2017-04-28 ENCOUNTER — Inpatient Hospital Stay (HOSPITAL_COMMUNITY): Payer: Medicare HMO

## 2017-04-28 ENCOUNTER — Encounter (HOSPITAL_COMMUNITY): Payer: Self-pay

## 2017-04-28 DIAGNOSIS — G309 Alzheimer's disease, unspecified: Secondary | ICD-10-CM

## 2017-04-28 DIAGNOSIS — F0280 Dementia in other diseases classified elsewhere without behavioral disturbance: Secondary | ICD-10-CM

## 2017-04-28 LAB — GLUCOSE, CAPILLARY: Glucose-Capillary: 88 mg/dL (ref 65–99)

## 2017-04-28 MED ORDER — ACETAMINOPHEN 325 MG PO TABS
650.0000 mg | ORAL_TABLET | Freq: Four times a day (QID) | ORAL | Status: DC
  Administered 2017-04-28 – 2017-04-29 (×5): 650 mg via ORAL
  Filled 2017-04-28 (×5): qty 2

## 2017-04-28 MED ORDER — IPRATROPIUM-ALBUTEROL 0.5-2.5 (3) MG/3ML IN SOLN
3.0000 mL | Freq: Three times a day (TID) | RESPIRATORY_TRACT | Status: DC
  Administered 2017-04-28: 3 mL via RESPIRATORY_TRACT
  Filled 2017-04-28: qty 3

## 2017-04-28 NOTE — Progress Notes (Signed)
PROGRESS NOTE   [name]  ZOX:[id number] DOB: August 17, 1934 DOA: 04/22/2017 PCP: Reymundo Poll, MD   Brief Narrative:  83 y.o. BF PMHx Chronic Systolic CHF (EF 81-19%) combination internal cardiac defibrillator (ICD) and pacemaker, COPD,Asthma, PE, Colon cancer,Colostomy , HLD, Hypothyroidism   Presents w progressively worsening dyspnea on 7/15.  Pox 70% when EMS arrived,  Placed on non-rebreather and then cpap which brought O2 to 96%. In ED,  CTA chest => negative for PE, but showed multifocal pneumonia and small left pleural effusion. Wbc 9.1,  BUn 26/creatinine 1.26  Trop 0.08;   Pt admitted for acute hypoxic respiratory failure has responded to antibiotics well and able to be weaned off of oxygen.   Subjective: Patient having right hip pain.      Assessment & Plan:   Active Problems:   Dementia   CKD (chronic kidney disease) stage 3, GFR 30-59 ml/min   Acute respiratory failure (HCC)   Elevated troponin   Pneumonia   HCAP (healthcare-associated pneumonia)  Acute respiratory failure with hypoxia/HCAP Able to be weaned off of oxygen -Secondary to multifocal pneumonia, HCAP -Urine strep antigen negative, urine legionella antigen pending -Blood culture x 2, NGTD -Sputum culture pending -Continue current antibiotics per HCAP protocol  -Bipap PRN -PT/OT following, recommending SNF -Social work consult working on SNF, bed is ready when patient medically ready for dc --Incentive spirometry and mobilize  Persistent Fever - given persistent fever, will check cT abd pelvis, reviewed CT chest, check CXR 2 view, follow clinically, incentive spirometry.   Troponin elevation -Likely due to demand ischemia -Asymptomatic   Chronic Systolic and Diastolic CHF  -Echocardiogram 11/2015 LVEF 35-40% , Mild AR, Moderate MS, mild-mod MR: No significant change in EF on this admission to echocardiogram see results below -Coreg 3.125 mg BID -Continue to hold  Lasix 40 mg daily -Hydralazine  12.5 mg TID -Continue decreased lisinopril 10 mg daily secondary to increased Cr -DC Imdur  -Strict I&O since admission -Continue Daily weight Filed Weights   04/26/17 0500 04/27/17 0500 04/28/17 0544  Weight: 87.3 kg (192 lb 8 oz) 88.1 kg (194 lb 3.6 oz) 85.6 kg (188 lb 11.4 oz)   Pulmonary hypertension -Worsening pulmonary hypertension by echocardiogram -See CHF  Anemia -Transfuse for hemoglobin<8  -7/16 transfused 1 unit PRBC   CKD stage 3 (baseline Cr~1.2) Lab Results  Component Value Date   CREATININE 1.25 (H) 04/27/2017   CREATININE 1.20 (H) 04/26/2017   CREATININE 1.24 (H) 04/25/2017  Maintaining diuresis and creatinine improved to baseline  Hypothyroidism -Cont levothyroxine  COPD -Dulera for Advair   DVT prophylaxis: Subcutaneous heparin Code Status: Full Family Communication: Spoke with son concerned about the patient's current SNF, would like to speak with social work concerning movement of patient to different ALF. Disposition Plan: SNF  Consultants:  None  Procedures/Significant Events:  7/16 Echocardiogram: LVEF = 40% to 45%. Moderate diffuse hypokinesis with regional   variations. - Aortic valve: mild to moderate regurgitation.   - Mitral valve: moderate mitral stenosis  - Pulmonary arteries: PA peak pressure: 65 mm Hg (S).   VENTILATOR SETTINGS:  Cultures 7/15 blood NGTD 7/15 sputum pending 7/15 MRSA by PCR negative 7/15 urine legionella Pending 7/15 urine strep pneumo negative  7/16 respiratory virus panel negative 7/16 transfuse 1 unit PRBC   Antimicrobials: Anti-infectives    Start     Stop   04/23/17 0500  vancomycin (VANCOCIN) 1,250 mg in sodium chloride 0.9 % 250 mL IVPB     04/29/17 2359  04/22/17 1200  ceFEPIme (MAXIPIME) 2 g in dextrose 5 % 50 mL IVPB         04/22/17 0615  ceFEPIme (MAXIPIME) 1 g in dextrose 5 % 50 mL IVPB  Status:  Discontinued     04/22/17 0612   04/22/17 0430  vancomycin (VANCOCIN) 1,500 mg in sodium  chloride 0.9 % 500 mL IVPB     04/22/17 0807   04/22/17 0415  piperacillin-tazobactam (ZOSYN) IVPB 3.375 g     04/22/17 0627    Continuous Infusions:  Objective: Vitals:   04/28/17 0816 04/28/17 0856 04/28/17 1020 04/28/17 1426  BP: (!) 159/60  (!) 142/61 (!) 141/64  Pulse: 94   90  Resp:    (!) 24  Temp:    98.9 F (37.2 C)  TempSrc:    Oral  SpO2:  97%  100%  Weight:      Height:        Intake/Output Summary (Last 24 hours) at 04/28/17 1528 Last data filed at 04/28/17 0840  Gross per 24 hour  Intake              240 ml  Output             1050 ml  Net             -810 ml   Filed Weights   04/26/17 0500 04/27/17 0500 04/28/17 0544  Weight: 87.3 kg (192 lb 8 oz) 88.1 kg (194 lb 3.6 oz) 85.6 kg (188 lb 11.4 oz)    Examination:  General: awake, sitting up, eating, oriented 3, no acute distress HEENT: Normocephalic, atraumatic, mucous membranes are moist Neck: Supple, no JVD Lungs: Few expiratory crackles, no labored breathing .  Cardiovascular: Regular rate and rhythm Abdomen: Soft, nontender, nondistended, positive bowel sounds  Extremities: tenderness right side over hip, no rash seen. No significant cyanosis, clubbing. Positive bilateral lower extremity edema 1+ Skin:No skin breaks, tears or lesions Psychiatry: Flat affect.  Neuro: No focal deficits  Data Reviewed: Care during the described time interval was provided by me .  I have reviewed this patient's available data, including medical history, events of note, physical examination, and all test results as part of my evaluation. I have personally reviewed and interpreted all radiology studies.  CBC:  Recent Labs Lab 04/23/17 0438 04/23/17 1623 04/24/17 0225 04/25/17 0259 04/26/17 0221 04/27/17 0305  WBC 12.5* 13.3* 13.1* 10.5 13.0* 11.6*  NEUTROABS 10.2*  --  10.1* 7.7 9.0* 7.6  HGB 7.2* 7.6* 9.0* 9.1* 9.7* 8.5*  HCT 24.2* 25.9* 30.1* 29.7* 32.2* 28.1*  MCV 83.7 83.8 83.1 83.2 83.0 83.9  PLT 285 333  296 335 350 737   Basic Metabolic Panel:  Recent Labs Lab 04/23/17 0438 04/24/17 0225 04/25/17 0259 04/26/17 0221 04/27/17 0305  NA 138 139 141 139 141  K 4.3 4.2 4.2 4.2 4.3  CL 106 107 106 105 109  CO2 23 23 26 25 24   GLUCOSE 73 84 87 91 90  BUN 30* 28* 20 17 19   CREATININE 1.58* 1.39* 1.24* 1.20* 1.25*  CALCIUM 8.4* 8.9 9.3 9.3 9.1  MG 1.8 1.8 1.9 1.7 1.7   GFR: Estimated Creatinine Clearance: 38.1 mL/min (A) (by C-G formula based on SCr of 1.25 mg/dL (H)). Liver Function Tests:  Recent Labs Lab 04/22/17 0059  AST 114*  ALT 72*  ALKPHOS 81  BILITOT 0.4  PROT 6.8  ALBUMIN 3.6   No results for input(s): LIPASE, AMYLASE in the last 168  hours. No results for input(s): AMMONIA in the last 168 hours. Coagulation Profile: No results for input(s): INR, PROTIME in the last 168 hours. Cardiac Enzymes:  Recent Labs Lab 04/22/17 0059  TROPONINI 0.08*   BNP (last 3 results) No results for input(s): PROBNP in the last 8760 hours. HbA1C: No results for input(s): HGBA1C in the last 72 hours. CBG:  Recent Labs Lab 04/28/17 0124  GLUCAP 88   Lipid Profile: No results for input(s): CHOL, HDL, LDLCALC, TRIG, CHOLHDL, LDLDIRECT in the last 72 hours. Thyroid Function Tests: No results for input(s): TSH, T4TOTAL, FREET4, T3FREE, THYROIDAB in the last 72 hours. Anemia Panel: No results for input(s): VITAMINB12, FOLATE, FERRITIN, TIBC, IRON, RETICCTPCT in the last 72 hours. Urine analysis:    Component Value Date/Time   COLORURINE YELLOW 04/26/2017 1406   APPEARANCEUR HAZY (A) 04/26/2017 1406   LABSPEC 1.016 04/26/2017 1406   PHURINE 5.0 04/26/2017 1406   GLUCOSEU NEGATIVE 04/26/2017 1406   HGBUR NEGATIVE 04/26/2017 1406   BILIRUBINUR NEGATIVE 04/26/2017 1406   KETONESUR NEGATIVE 04/26/2017 1406   PROTEINUR 100 (A) 04/26/2017 1406   UROBILINOGEN 0.2 07/23/2015 1610   NITRITE NEGATIVE 04/26/2017 1406   LEUKOCYTESUR NEGATIVE 04/26/2017 1406    Recent Results  (from the past 240 hour(s))  MRSA PCR Screening     Status: None   Collection Time: 04/22/17  4:35 PM  Result Value Ref Range Status   MRSA by PCR NEGATIVE NEGATIVE Final    Comment:        The GeneXpert MRSA Assay (FDA approved for NASAL specimens only), is one component of a comprehensive MRSA colonization surveillance program. It is not intended to diagnose MRSA infection nor to guide or monitor treatment for MRSA infections.   Culture, blood (routine x 2) Call MD if unable to obtain prior to antibiotics being given     Status: None   Collection Time: 04/22/17  4:47 PM  Result Value Ref Range Status   Specimen Description BLOOD LEFT HAND  Final   Special Requests   Final    BOTTLES DRAWN AEROBIC ONLY Blood Culture results may not be optimal due to an inadequate volume of blood received in culture bottles   Culture NO GROWTH 5 DAYS  Final   Report Status 04/27/2017 FINAL  Final  Culture, blood (Routine X 2) w Reflex to ID Panel     Status: None   Collection Time: 04/22/17  5:42 PM  Result Value Ref Range Status   Specimen Description BLOOD LEFT HAND  Final   Special Requests IN PEDIATRIC BOTTLE Blood Culture adequate volume  Final   Culture NO GROWTH 5 DAYS  Final   Report Status 04/27/2017 FINAL  Final  Culture, sputum-assessment     Status: None   Collection Time: 04/22/17  6:04 PM  Result Value Ref Range Status   Specimen Description EXPECTORATED SPUTUM  Final   Special Requests Normal  Final   Sputum evaluation THIS SPECIMEN IS ACCEPTABLE FOR SPUTUM CULTURE  Final   Report Status 04/23/2017 FINAL  Final  Culture, respiratory (NON-Expectorated)     Status: None   Collection Time: 04/22/17  6:04 PM  Result Value Ref Range Status   Specimen Description EXPECTORATED SPUTUM  Final   Special Requests Normal Reflexed from [id number]  Final   Gram Stain   Final    ABUNDANT WBC PRESENT, PREDOMINANTLY PMN FEW GRAM NEGATIVE COCCOBACILLI FEW GRAM NEGATIVE RODS RARE GRAM POSITIVE  COCCI IN CLUSTERS    Culture Consistent  with normal respiratory flora.  Final   Report Status 04/25/2017 FINAL  Final  Respiratory Panel by PCR     Status: None   Collection Time: 04/23/17  3:54 PM  Result Value Ref Range Status   Adenovirus NOT DETECTED NOT DETECTED Final   Coronavirus 229E NOT DETECTED NOT DETECTED Final   Coronavirus HKU1 NOT DETECTED NOT DETECTED Final   Coronavirus NL63 NOT DETECTED NOT DETECTED Final   Coronavirus OC43 NOT DETECTED NOT DETECTED Final   Metapneumovirus NOT DETECTED NOT DETECTED Final   Rhinovirus / Enterovirus NOT DETECTED NOT DETECTED Final   Influenza A NOT DETECTED NOT DETECTED Final   Influenza B NOT DETECTED NOT DETECTED Final   Parainfluenza Virus 1 NOT DETECTED NOT DETECTED Final   Parainfluenza Virus 2 NOT DETECTED NOT DETECTED Final   Parainfluenza Virus 3 NOT DETECTED NOT DETECTED Final   Parainfluenza Virus 4 NOT DETECTED NOT DETECTED Final   Respiratory Syncytial Virus NOT DETECTED NOT DETECTED Final   Bordetella pertussis NOT DETECTED NOT DETECTED Final   Chlamydophila pneumoniae NOT DETECTED NOT DETECTED Final   Mycoplasma pneumoniae NOT DETECTED NOT DETECTED Final   Radiology Studies: No results found.Scheduled Meds: . acetaminophen  650 mg Oral Q6H  . aspirin EC  81 mg Oral Daily  . atorvastatin  20 mg Oral Daily  . carvedilol  3.125 mg Oral BID WC  . doxycycline  100 mg Oral Q12H  . famotidine  20 mg Oral Daily  . heparin  5,000 Units Subcutaneous Q8H  . hydrALAZINE  12.5 mg Oral TID  . ipratropium-albuterol  3 mL Nebulization TID  . iron polysaccharides  150 mg Oral BID  . lactobacillus  1 g Oral TID WC  . levothyroxine  50 mcg Oral QAC breakfast  . lisinopril  10 mg Oral Daily  . loratadine  10 mg Oral Daily  . mometasone-formoterol  2 puff Inhalation BID  . polyvinyl alcohol  1 drop Both Eyes TID  . potassium chloride  10 mEq Oral Daily   Continuous Infusions:   LOS: 6 days   Time spent: 22 minutes  Irwin Brakeman, MD [facility] Pager [phone]   If 7PM-7AM, please contact night-coverage [email or web address] Password [facility] 04/28/2017, 3:28 PM

## 2017-04-28 NOTE — Progress Notes (Addendum)
Pt external catheter yield 300 cc of clear urine since 7 am this morning. Bladder scan shows 420 cc of urine. MD notified.  Ferdinand Lango, RN

## 2017-04-29 ENCOUNTER — Emergency Department (HOSPITAL_COMMUNITY)
Admission: EM | Admit: 2017-04-29 | Discharge: 2017-04-29 | Disposition: A | Discharge status: Home / Self Care | Payer: Medicare HMO | Attending: Emergency Medicine | Admitting: Emergency Medicine

## 2017-04-29 ENCOUNTER — Encounter (HOSPITAL_COMMUNITY): Payer: Self-pay | Admitting: Pharmacy Technician

## 2017-04-29 DIAGNOSIS — Z79899 Other long term (current) drug therapy: Secondary | ICD-10-CM | POA: Insufficient documentation

## 2017-04-29 DIAGNOSIS — I129 Hypertensive chronic kidney disease with stage 1 through stage 4 chronic kidney disease, or unspecified chronic kidney disease: Secondary | ICD-10-CM | POA: Insufficient documentation

## 2017-04-29 DIAGNOSIS — I130 Hypertensive heart and chronic kidney disease with heart failure and stage 1 through stage 4 chronic kidney disease, or unspecified chronic kidney disease: Secondary | ICD-10-CM | POA: Insufficient documentation

## 2017-04-29 DIAGNOSIS — E039 Hypothyroidism, unspecified: Secondary | ICD-10-CM | POA: Insufficient documentation

## 2017-04-29 DIAGNOSIS — Z85038 Personal history of other malignant neoplasm of large intestine: Secondary | ICD-10-CM | POA: Insufficient documentation

## 2017-04-29 DIAGNOSIS — I5022 Chronic systolic (congestive) heart failure: Secondary | ICD-10-CM | POA: Insufficient documentation

## 2017-04-29 DIAGNOSIS — E119 Type 2 diabetes mellitus without complications: Secondary | ICD-10-CM | POA: Insufficient documentation

## 2017-04-29 DIAGNOSIS — Z789 Other specified health status: Secondary | ICD-10-CM

## 2017-04-29 DIAGNOSIS — N183 Chronic kidney disease, stage 3 (moderate): Secondary | ICD-10-CM | POA: Insufficient documentation

## 2017-04-29 DIAGNOSIS — Z7982 Long term (current) use of aspirin: Secondary | ICD-10-CM | POA: Insufficient documentation

## 2017-04-29 DIAGNOSIS — J45909 Unspecified asthma, uncomplicated: Secondary | ICD-10-CM | POA: Insufficient documentation

## 2017-04-29 DIAGNOSIS — I110 Hypertensive heart disease with heart failure: Secondary | ICD-10-CM | POA: Insufficient documentation

## 2017-04-29 DIAGNOSIS — J449 Chronic obstructive pulmonary disease, unspecified: Secondary | ICD-10-CM | POA: Insufficient documentation

## 2017-04-29 DIAGNOSIS — Z022 Encounter for examination for admission to residential institution: Secondary | ICD-10-CM | POA: Insufficient documentation

## 2017-04-29 DIAGNOSIS — E1122 Type 2 diabetes mellitus with diabetic chronic kidney disease: Secondary | ICD-10-CM | POA: Insufficient documentation

## 2017-04-29 LAB — CBC WITH DIFFERENTIAL/PLATELET
BAND NEUTROPHILS: 0 %
BLASTS: 0 %
Basophils Absolute: 0 10*3/uL (ref 0.0–0.1)
Basophils Relative: 0 %
EOS ABS: 0.1 10*3/uL (ref 0.0–0.7)
Eosinophils Relative: 1 %
HEMATOCRIT: 30.8 % — AB (ref 36.0–46.0)
HEMOGLOBIN: 9.3 g/dL — AB (ref 12.0–15.0)
LYMPHS PCT: 22 %
Lymphs Abs: 2.9 10*3/uL (ref 0.7–4.0)
MCH: 24.8 pg — ABNORMAL LOW (ref 26.0–34.0)
MCHC: 30.2 g/dL (ref 30.0–36.0)
MCV: 82.1 fL (ref 78.0–100.0)
MONO ABS: 1.3 10*3/uL — AB (ref 0.1–1.0)
MONOS PCT: 10 %
Metamyelocytes Relative: 0 %
Myelocytes: 0 %
NRBC: 0 /100{WBCs}
Neutro Abs: 8.7 10*3/uL — ABNORMAL HIGH (ref 1.7–7.7)
Neutrophils Relative %: 67 %
Platelets: 333 10*3/uL (ref 150–400)
Promyelocytes Absolute: 0 %
RBC: 3.75 MIL/uL — ABNORMAL LOW (ref 3.87–5.11)
RDW: 15.8 % — ABNORMAL HIGH (ref 11.5–15.5)
WBC: 13 10*3/uL — ABNORMAL HIGH (ref 4.0–10.5)

## 2017-04-29 MED ORDER — TAMSULOSIN HCL 0.4 MG PO CAPS
0.4000 mg | ORAL_CAPSULE | Freq: Every day | ORAL | Status: DC
Start: 2017-04-29 — End: 2017-12-01

## 2017-04-29 MED ORDER — HYDROCODONE-ACETAMINOPHEN 5-325 MG PO TABS: 1.0000 | ORAL_TABLET | ORAL | 0 refills | Status: DC | PRN

## 2017-04-29 MED ORDER — LISINOPRIL 10 MG PO TABS: 10.0000 mg | ORAL_TABLET | Freq: Every day | ORAL | Status: DC

## 2017-04-29 MED ORDER — DOXYCYCLINE HYCLATE 100 MG PO TABS: 100.0000 mg | ORAL_TABLET | Freq: Two times a day (BID) | ORAL | 0 refills | Status: AC

## 2017-04-29 NOTE — Discharge Summary (Addendum)
Physician Discharge Summary  [name] FTD:[id number] DOB: Oct 26, 1933 DOA: 04/22/2017  PCP: Reymundo Poll, MD  Admit date: 04/22/2017 Discharge date: 04/29/2017 Disposition:  Skilled nursing facility  Recommendations for Outpatient Follow-up:  1. Follow up with PCP in 1 weeks 2. DC bladder scans.  Please monitor urine output and place foley catheter if needed for any signs of urinary retention   3. Please obtain BMP/CBC in one week 4. Aspiration precautions recommended.  5. Continue doxycycline for 3 more days starting 04/30/17 thru 05/03/17 then stop 6. Continue routine ostomy bag care.   7. Consult speech therapy to assess swallow function and diet 8. PT recommended.  9. Frequent turning in bed recommended  Discharge Condition: Stable   CODE STATUS: FULL     Brief Hospitalization Summary: Please see all hospital notes, images, labs for full details of the hospitalization. HPI:  [name]  is a 83 y.o. female, w CHF (EF 35-40%), Copd, PE,  presents w progressively worsening dyspnea today.  Pox 70% when EMS arrived,  Placed on non-rebreather and then cpap which brought O2 to 96%.  Pt was transported to ED for evaluation .   In ED,  CTA chest => negative for PE, but showed multifocal pneumonia and small left pleural effusion. Wbc 9.1,  BUn 26/creatinine 1.26  Trop 0.08;   Pt will be admitted for acute hypoxic respiratory failure.   Brief Narrative:  83 y.o.BF PMHx Chronic Systolic CHF (EF 06-23%) combination internal cardiac defibrillator (ICD) and pacemaker, COPD,Asthma, PE, Colon cancer,Colostomy , HLD, Hypothyroidism  Presents w progressively worsening dyspnea on 7/15. Pox 70% when EMS arrived, Placed on non-rebreather and then cpap which brought O2 to 96%. In ED, CTA chest =>negative for PE, but showed multifocal pneumonia and small left pleural effusion. Wbc 9.1, BUn 26/creatinine 1.26 Trop 0.08; Pt admitted for acute hypoxic respiratory failure has responded to  antibiotics well and able to be weaned off of oxygen.   Assessment & Plan:   Active Problems:   Dementia   CKD (chronic kidney disease) stage 3, GFR 30-59 ml/min   Acute respiratory failure    Elevated troponin    Pneumonia   HCAP (healthcare-associated pneumonia)  Acute respiratory failure with hypoxia/HCAP Able to be weaned off of oxygen -Secondary to multifocal pneumonia, HCAP -Urine strep antigen negative, urine legionella antigen pending -Blood culture x 2, NGTD -Sputum culture pending -Continue oral doxycycline for 3 more days starting 04/30/17 thru 05/03/17  -Bipap PRN has not been needed  -PT/OT following, recommending SNF -Social work consult working on SNF, bed is ready when patient medically ready for dc --Incentive spirometry and mobilize  Troponin elevation -Likely due to demand ischemia -Asymptomatic   Chronic Systolic and Diastolic CHF  -Echocardiogram 11/2015 LVEF 35-40% , Mild AR, Moderate MS, mild-mod MR: No significant change in EF on this admission to echocardiogram see results below -Coreg 3.125 mg BID -resume home Lasix 40 mg daily -Hydralazine 12.5 mg TID -Continue decreased lisinopril 10 mg daily secondary to increased Cr -DC'd Imdur  -Strict I&O since admission -Continue Daily weight      Filed Weights   04/26/17 0500 04/27/17 0500 04/28/17 0544  Weight: 87.3 kg (192 lb 8 oz) 88.1 kg (194 lb 3.6 oz) 85.6 kg (188 lb 11.4 oz)   Pulmonary hypertension -Worsening pulmonary hypertension by echocardiogram -See CHF  Anemia -Transfuse for hemoglobin<8  -7/16 transfused 1 unit PRBC   CKD stage 3 (baseline Cr~1.2) Recent Labs       Lab Results  Component Value Date   CREATININE 1.25 (H) 04/27/2017   CREATININE 1.20 (H) 04/26/2017   CREATININE 1.24 (H) 04/25/2017    Maintaining diuresis and creatinine improved to baseline  Hypothyroidism -Cont levothyroxine  COPD -Dulera for Advair   DVT prophylaxis: Subcutaneous heparin Code  Status: Full Family Communication: son Disposition Plan: SNF  Procedures/Significant Events:  7/16 Echocardiogram: LVEF = 40% to 45%. Moderate diffuse hypokinesis with regional variations. - Aortic valve: mild to moderate regurgitation. - Mitral valve: moderate mitral stenosis  - Pulmonary arteries: PA peak pressure: 65 mm Hg (S).   Cultures 7/15 blood NGTD 7/15 sputum pending 7/15 MRSA by PCR negative 7/15 urine legionella Pending 7/15 urine strep pneumo negative  7/16 respiratory virus panel negative 7/16 transfuse 1 unit PRBC   Discharge Diagnoses:  Active Problems:   Dementia   CKD (chronic kidney disease) stage 3, GFR 30-59 ml/min   Acute respiratory failure (HCC)   Elevated troponin   Pneumonia   HCAP (healthcare-associated pneumonia)  Discharge Instructions: Discharge Instructions    Increase activity slowly    Complete by:  As directed      Allergies as of 04/29/2017   No Known Allergies     Medication List    STOP taking these medications   feeding supplement (ENSURE ENLIVE) Liqd     TAKE these medications   acetaminophen 325 MG tablet Commonly known as:  TYLENOL Take 650 mg by mouth every 6 (six) hours as needed (pain).   albuterol (2.5 MG/3ML) 0.083% nebulizer solution Commonly known as:  PROVENTIL Take 3 mLs (2.5 mg total) by nebulization every 4 (four) hours as needed for wheezing.   aspirin EC 81 MG tablet Take 1 tablet (81 mg total) by mouth every morning. What changed:  when to take this   atorvastatin 20 MG tablet Commonly known as:  LIPITOR Take 20 mg by mouth daily.   benzonatate 100 MG capsule Commonly known as:  TESSALON Take 1 capsule (100 mg total) by mouth 3 (three) times daily as needed for cough.   carvedilol 3.125 MG tablet Commonly known as:  COREG Take 1 tablet (3.125 mg total) by mouth 2 (two) times daily with a meal.   doxycycline 100 MG tablet Commonly known as:  VIBRA-TABS Take 1 tablet (100 mg total) by  mouth every 12 (twelve) hours.   FERREX 150 150 MG capsule Generic drug:  iron polysaccharides Take 150 mg by mouth 2 (two) times daily.   FLORANEX PO Take 1 tablet by mouth 3 (three) times daily with meals.   Fluticasone-Salmeterol 500-50 MCG/DOSE Aepb Commonly known as:  ADVAIR Inhale 1 puff into the lungs 2 (two) times daily. Rinse mouth after use   furosemide 40 MG tablet Commonly known as:  LASIX Take 1 tablet (40 mg total) by mouth daily. What changed:  Another medication with the same name was removed. Continue taking this medication, and follow the directions you see here.   hydrALAZINE 25 MG tablet Commonly known as:  APRESOLINE Take 0.5 tablets (12.5 mg total) by mouth 3 (three) times daily.   HYDROcodone-acetaminophen 5-325 MG tablet Commonly known as:  NORCO/VICODIN Take 1 tablet by mouth every 4 (four) hours as needed (pain).   isosorbide mononitrate 30 MG 24 hr tablet Commonly known as:  IMDUR Take 1 tablet (30 mg total) by mouth daily.   levothyroxine 50 MCG tablet Commonly known as:  SYNTHROID, LEVOTHROID Take 50 mcg by mouth daily.   lisinopril 10 MG tablet Commonly known as:  PRINIVIL,ZESTRIL Take 1 tablet (10 mg total) by mouth daily. What changed:  medication strength  how much to take   loratadine 10 MG tablet Commonly known as:  CLARITIN Take 10 mg by mouth daily.   oxymetazoline 0.05 % nasal spray Commonly known as:  AFRIN Place 1 spray into both nostrils 2 (two) times daily as needed (for nose bleeds). Reported on 12/23/2015   potassium chloride 10 MEQ tablet Commonly known as:  K-DUR Take 1 tablet (10 mEq total) by mouth daily.   ranitidine 150 MG tablet Commonly known as:  ZANTAC Take 150 mg by mouth daily.   REFRESH 1 % ophthalmic solution Generic drug:  carboxymethylcellulose Place 1 drop into both eyes 3 (three) times daily.   tamsulosin 0.4 MG Caps capsule Commonly known as:  FLOMAX Take 1 capsule (0.4 mg total) by mouth  daily after supper.       Contact information for follow-up providers    Reymundo Poll, MD. Schedule an appointment as soon as possible for a visit in 1 week(s).   Specialty:  Family Medicine Why:  Hospital follow Up  Contact information: [address] [place] [address] [phone]            Contact information for after-discharge care    Destination    [facility] Follow up.   Specialty:  [place] information: [address] [phone]                 No Known Allergies Current Discharge Medication List    START taking these medications   Details  doxycycline (VIBRA-TABS) 100 MG tablet Take 1 tablet (100 mg total) by mouth every 12 (twelve) hours. Qty: 6 tablet, Refills: 0    tamsulosin (FLOMAX) 0.4 MG CAPS capsule Take 1 capsule (0.4 mg total) by mouth daily after supper. Qty: 30 capsule      CONTINUE these medications which have CHANGED   Details  HYDROcodone-acetaminophen (NORCO/VICODIN) 5-325 MG tablet Take 1 tablet by mouth every 4 (four) hours as needed (pain). Qty: 12 tablet, Refills: 0    lisinopril (PRINIVIL,ZESTRIL) 10 MG tablet Take 1 tablet (10 mg total) by mouth daily.      CONTINUE these medications which have NOT CHANGED   Details  acetaminophen (TYLENOL) 325 MG tablet Take 650 mg by mouth every 6 (six) hours as needed (pain).    albuterol (PROVENTIL) (2.5 MG/3ML) 0.083% nebulizer solution Take 3 mLs (2.5 mg total) by nebulization every 4 (four) hours as needed for wheezing. Qty: 75 mL, Refills: 12    aspirin EC 81 MG tablet Take 1 tablet (81 mg total) by mouth every morning.    atorvastatin (LIPITOR) 20 MG tablet Take 20 mg by mouth daily.     benzonatate (TESSALON) 100 MG capsule Take 1 capsule (100 mg total) by mouth 3 (three) times daily as needed for cough. Qty: 21 capsule, Refills: 0    carboxymethylcellulose (REFRESH) 1  % ophthalmic solution Place 1 drop into both eyes 3 (three) times daily.    carvedilol (COREG) 3.125 MG tablet Take 1 tablet (3.125 mg total) by mouth 2 (two) times daily with a meal. Qty: 60 tablet, Refills: 0    Fluticasone-Salmeterol (ADVAIR) 500-50 MCG/DOSE AEPB Inhale 1 puff into the lungs 2 (two) times daily. Rinse mouth after use    furosemide (LASIX) 40 MG tablet Take 1 tablet (40 mg total) by mouth daily. Qty: 30  tablet    hydrALAZINE (APRESOLINE) 25 MG tablet Take 0.5 tablets (12.5 mg total) by mouth 3 (three) times daily.    iron polysaccharides (FERREX 150) 150 MG capsule Take 150 mg by mouth 2 (two) times daily.    isosorbide mononitrate (IMDUR) 30 MG 24 hr tablet Take 1 tablet (30 mg total) by mouth daily. Qty: 30 tablet, Refills: 0    Lactobacillus (FLORANEX PO) Take 1 tablet by mouth 3 (three) times daily with meals.    levothyroxine (SYNTHROID, LEVOTHROID) 50 MCG tablet Take 50 mcg by mouth daily.  Refills: 2    loratadine (CLARITIN) 10 MG tablet Take 10 mg by mouth daily.    oxymetazoline (AFRIN) 0.05 % nasal spray Place 1 spray into both nostrils 2 (two) times daily as needed (for nose bleeds). Reported on 12/23/2015    potassium chloride (K-DUR) 10 MEQ tablet Take 1 tablet (10 mEq total) by mouth daily. Qty: 5 tablet, Refills: 0    ranitidine (ZANTAC) 150 MG tablet Take 150 mg by mouth daily.  Refills: 11      STOP taking these medications     feeding supplement, ENSURE ENLIVE, (ENSURE ENLIVE) LIQD         Procedures/Studies: Dg Chest 1 View  Result Date: 04/26/2017 CLINICAL DATA:  Shortness of Breath EXAM: CHEST 1 VIEW COMPARISON:  04/22/2017 FINDINGS: Cardiac shadow remains enlarged. A defibrillator is again seen. The overall inspiratory effort is poor. Elevation of the hemidiaphragm on the right is again seen. Vascular congestion is noted without sizable effusion. Persistent patchy changes in the left base are seen. IMPRESSION: No significant change  from the prior exam. Electronically Signed   By: Inez Catalina M.D.   On: 04/26/2017 08:22   Dg Chest 2 View  Result Date: 04/28/2017 CLINICAL DATA:  Fever.  Pneumonia.  Increasing dyspnea. EXAM: CHEST  2 VIEW COMPARISON:  Chest radiograph 04/26/2017.  Chest CTA 04/22/2017 FINDINGS: Left-sided pacemaker remains in place. Again seen cardiomegaly. Stable mediastinal contours. There is elevation of right hemidiaphragm with adjacent compressive atelectasis. Retrocardiac opacity corresponding to consolidation and trace left pleural effusion on prior CT, similar to prior exam. Minimal right infrahilar opacity. No new airspace disease. Pulmonary vasculature is stable. No pneumothorax. The bones are under mineralized. IMPRESSION: 1. Stable retrocardiac opacity in right infrahilar opacity, suspicious for pneumonia. No significant change from prior exam. Given distribution, aspiration is considered. 2. Stable cardiomegaly. 3. Chronic elevation of right hemidiaphragm with adjacent atelectasis. Electronically Signed   By: Jeb Levering M.D.   On: 04/28/2017 21:40   Ct Angio Chest Pe W And/or Wo Contrast  Result Date: 04/22/2017 CLINICAL DATA:  Shortness of breath and hypoxia EXAM: CT ANGIOGRAPHY CHEST WITH CONTRAST TECHNIQUE: Multidetector CT imaging of the chest was performed using the standard protocol during bolus administration of intravenous contrast. Multiplanar CT image reconstructions and MIPs were obtained to evaluate the vascular anatomy. CONTRAST:  80 mL Isovue 370 intravenous COMPARISON:  Chest x-ray 04/22/2017, CT chest 07/26/2015 FINDINGS: Cardiovascular: Satisfactory opacification of the pulmonary arteries to the segmental level. No evidence of pulmonary embolism. Mild aneurysmal dilatation of the ascending aorta up to 4.1 cm. Generous appearing pulmonary artery trunk. Aortic atherosclerosis. Intracardiac pacing leads. Cardiomegaly. No large pericardial effusion. Mediastinum/Nodes: Midline trachea. No  suspicious thyroid mass. Nonspecific mediastinal adenopathy with lymph nodes measuring up to 1 cm. Esophagus within normal limits. Lungs/Pleura: Small left-sided pleural effusion. Multifocal ground-glass densities and nodular infiltrates within the upper and lower lobes and right middle lobe. Upper Abdomen:  No acute abnormality. Musculoskeletal: Degenerative changes. No acute or suspicious bone lesion. Review of the MIP images confirms the above findings. IMPRESSION: 1. Negative for acute pulmonary embolus. 2. Cardiomegaly. 3. Small left pleural effusion. Multifocal ground-glass densities and consolidation suspicious for multifocal pneumonia. 4. Mild aneurysmal dilatation of ascending aorta up to 4.1 cm. Recommend annual imaging followup by CTA or MRA. This recommendation follows 2010 ACCF/AHA/AATS/ACR/ASA/SCA/SCAI/SIR/STS/SVM Guidelines for the Diagnosis and Management of Patients with Thoracic Aortic Disease. Circulation. 2010; 121: G269-S854 Aortic aneurysm NOS (ICD10-I71.9). Aortic Atherosclerosis (ICD10-170.0) Electronically Signed   By: Donavan Foil M.D.   On: 04/22/2017 03:51   Dg Chest Portable 1 View  Result Date: 04/22/2017 CLINICAL DATA:  Shortness of breath, hypoxia. EXAM: PORTABLE CHEST 1 VIEW COMPARISON:  Chest radiograph Feb 16, 2017 and priors FINDINGS: Cardiac silhouette is mildly enlarged, mediastinal silhouette is unremarkable for this low inspiratory examination with crowded vasculature markings. The lungs are clear without pleural effusions or focal consolidations. Similar bronchitic changes. Bibasilar strandy densities. Trachea projects midline and there is no pneumothorax. Dual lead LEFT AICD in situ. Included soft tissue planes and osseous structures are non-suspicious. Osteopenia. IMPRESSION: Chronic bronchitic changes and bibasilar atelectasis. Mild cardiomegaly Electronically Signed   By: Elon Alas M.D.   On: 04/22/2017 01:09   Dg Hip Unilat With Pelvis 2-3 Views  Right  Result Date: 04/28/2017 CLINICAL DATA:  Acute right hip pain. EXAM: DG HIP (WITH OR WITHOUT PELVIS) 2-3V RIGHT COMPARISON:  None. FINDINGS: The cortical margins of the bony pelvis are intact. No fracture. Pubic symphysis and sacroiliac joints are congruent. Both femoral heads are well-seated in the respective acetabula. Mild osteoarthritis of the right hip with acetabular spurring and femoral head osteophytes. Moderate sacroiliac joint degenerative change. 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 No evidence of acute osseous abnormality. Electronically Signed   By: Jeb Levering M.D.   On: 04/28/2017 21:42      Subjective: Pt without complaints today, says she is breathing better today. Pt has been afebrile.    Discharge Exam: Vitals:   04/29/17 0920 04/29/17 0922  BP: (!) 144/56   Pulse:  97  Resp:    Temp:     Vitals:   04/29/17 0459 04/29/17 0848 04/29/17 0920 04/29/17 0922  BP: (!) 160/81 (!) 149/60 (!) 144/56   Pulse: (!) 105 99  97  Resp: (!) 25 (!) 28    Temp: 99.4 F (37.4 C) 99.6 F (37.6 C)    TempSrc: Oral Oral    SpO2: 97% 98%    Weight: 87.9 kg (193 lb 12.6 oz)     Height:       General: Pt is alert, awake, not in acute distress Cardiovascular: RRR, S1/S2 +, no rubs, no gallops Respiratory: CTA bilaterally, no wheezing, no rhonchi Abdominal: osteomy bag viable with soft black stool, Soft, NT, ND, bowel sounds + Extremities: no edema, no cyanosis   The results of significant diagnostics from this hospitalization (including imaging, microbiology, ancillary and laboratory) are listed below for reference.     Microbiology: Recent Results (from the past 240 hour(s))  MRSA PCR Screening     Status: None   Collection Time: 04/22/17  4:35 PM  Result Value Ref Range Status   MRSA by PCR NEGATIVE NEGATIVE Final    Comment:        The GeneXpert MRSA Assay (FDA approved for NASAL specimens only), is one component of  a comprehensive MRSA colonization surveillance program. It is  not intended to diagnose MRSA infection nor to guide or monitor treatment for MRSA infections.   Culture, blood (routine x 2) Call MD if unable to obtain prior to antibiotics being given     Status: None   Collection Time: 04/22/17  4:47 PM  Result Value Ref Range Status   Specimen Description BLOOD LEFT HAND  Final   Special Requests   Final    BOTTLES DRAWN AEROBIC ONLY Blood Culture results may not be optimal due to an inadequate volume of blood received in culture bottles   Culture NO GROWTH 5 DAYS  Final   Report Status 04/27/2017 FINAL  Final  Culture, blood (Routine X 2) w Reflex to ID Panel     Status: None   Collection Time: 04/22/17  5:42 PM  Result Value Ref Range Status   Specimen Description BLOOD LEFT HAND  Final   Special Requests IN PEDIATRIC BOTTLE Blood Culture adequate volume  Final   Culture NO GROWTH 5 DAYS  Final   Report Status 04/27/2017 FINAL  Final  Culture, sputum-assessment     Status: None   Collection Time: 04/22/17  6:04 PM  Result Value Ref Range Status   Specimen Description EXPECTORATED SPUTUM  Final   Special Requests Normal  Final   Sputum evaluation THIS SPECIMEN IS ACCEPTABLE FOR SPUTUM CULTURE  Final   Report Status 04/23/2017 FINAL  Final  Culture, respiratory (NON-Expectorated)     Status: None   Collection Time: 04/22/17  6:04 PM  Result Value Ref Range Status   Specimen Description EXPECTORATED SPUTUM  Final   Special Requests Normal Reflexed from [id number]  Final   Gram Stain   Final    ABUNDANT WBC PRESENT, PREDOMINANTLY PMN FEW GRAM NEGATIVE COCCOBACILLI FEW GRAM NEGATIVE RODS RARE GRAM POSITIVE COCCI IN CLUSTERS    Culture Consistent with normal respiratory flora.  Final   Report Status 04/25/2017 FINAL  Final  Respiratory Panel by PCR     Status: None   Collection Time: 04/23/17  3:54 PM  Result Value Ref Range Status   Adenovirus NOT DETECTED NOT DETECTED Final    Coronavirus 229E NOT DETECTED NOT DETECTED Final   Coronavirus HKU1 NOT DETECTED NOT DETECTED Final   Coronavirus NL63 NOT DETECTED NOT DETECTED Final   Coronavirus OC43 NOT DETECTED NOT DETECTED Final   Metapneumovirus NOT DETECTED NOT DETECTED Final   Rhinovirus / Enterovirus NOT DETECTED NOT DETECTED Final   Influenza A NOT DETECTED NOT DETECTED Final   Influenza B NOT DETECTED NOT DETECTED Final   Parainfluenza Virus 1 NOT DETECTED NOT DETECTED Final   Parainfluenza Virus 2 NOT DETECTED NOT DETECTED Final   Parainfluenza Virus 3 NOT DETECTED NOT DETECTED Final   Parainfluenza Virus 4 NOT DETECTED NOT DETECTED Final   Respiratory Syncytial Virus NOT DETECTED NOT DETECTED Final   Bordetella pertussis NOT DETECTED NOT DETECTED Final   Chlamydophila pneumoniae NOT DETECTED NOT DETECTED Final   Mycoplasma pneumoniae NOT DETECTED NOT DETECTED Final     Labs: BNP (last 3 results)  Recent Labs  04/22/17 0059  BNP 160.1*   Basic Metabolic Panel:  Recent Labs Lab 04/23/17 0438 04/24/17 0225 04/25/17 0259 04/26/17 0221 04/27/17 0305  NA 138 139 141 139 141  K 4.3 4.2 4.2 4.2 4.3  CL 106 107 106 105 109  CO2 23 23 26 25 24   GLUCOSE 73 84 87 91 90  BUN 30* 28* 20 17 19   CREATININE 1.58* 1.39* 1.24* 1.20* 1.25*  CALCIUM 8.4* 8.9 9.3 9.3 9.1  MG 1.8 1.8 1.9 1.7 1.7   Liver Function Tests: No results for input(s): AST, ALT, ALKPHOS, BILITOT, PROT, ALBUMIN in the last 168 hours. No results for input(s): LIPASE, AMYLASE in the last 168 hours. No results for input(s): AMMONIA in the last 168 hours. CBC:  Recent Labs Lab 04/24/17 0225 04/25/17 0259 04/26/17 0221 04/27/17 0305 04/29/17 0442  WBC 13.1* 10.5 13.0* 11.6* 13.0*  NEUTROABS 10.1* 7.7 9.0* 7.6 8.7*  HGB 9.0* 9.1* 9.7* 8.5* 9.3*  HCT 30.1* 29.7* 32.2* 28.1* 30.8*  MCV 83.1 83.2 83.0 83.9 82.1  PLT 296 335 350 341 333   Cardiac Enzymes: No results for input(s): CKTOTAL, CKMB, CKMBINDEX, TROPONINI in the last  168 hours. BNP: Invalid input(s): POCBNP CBG:  Recent Labs Lab 04/28/17 0124  GLUCAP 88   D-Dimer No results for input(s): DDIMER in the last 72 hours. Hgb A1c No results for input(s): HGBA1C in the last 72 hours. Lipid Profile No results for input(s): CHOL, HDL, LDLCALC, TRIG, CHOLHDL, LDLDIRECT in the last 72 hours. Thyroid function studies No results for input(s): TSH, T4TOTAL, T3FREE, THYROIDAB in the last 72 hours.  Invalid input(s): FREET3 Anemia work up No results for input(s): VITAMINB12, FOLATE, FERRITIN, TIBC, IRON, RETICCTPCT in the last 72 hours. Urinalysis    Component Value Date/Time   COLORURINE YELLOW 04/26/2017 1406   APPEARANCEUR HAZY (A) 04/26/2017 1406   LABSPEC 1.016 04/26/2017 1406   PHURINE 5.0 04/26/2017 1406   GLUCOSEU NEGATIVE 04/26/2017 1406   HGBUR NEGATIVE 04/26/2017 1406   BILIRUBINUR NEGATIVE 04/26/2017 1406   KETONESUR NEGATIVE 04/26/2017 1406   PROTEINUR 100 (A) 04/26/2017 1406   UROBILINOGEN 0.2 07/23/2015 1610   NITRITE NEGATIVE 04/26/2017 1406   LEUKOCYTESUR NEGATIVE 04/26/2017 1406   Sepsis Labs Invalid input(s): PROCALCITONIN,  WBC,  LACTICIDVEN Microbiology Recent Results (from the past 240 hour(s))  MRSA PCR Screening     Status: None   Collection Time: 04/22/17  4:35 PM  Result Value Ref Range Status   MRSA by PCR NEGATIVE NEGATIVE Final    Comment:        The GeneXpert MRSA Assay (FDA approved for NASAL specimens only), is one component of a comprehensive MRSA colonization surveillance program. It is not intended to diagnose MRSA infection nor to guide or monitor treatment for MRSA infections.   Culture, blood (routine x 2) Call MD if unable to obtain prior to antibiotics being given     Status: None   Collection Time: 04/22/17  4:47 PM  Result Value Ref Range Status   Specimen Description BLOOD LEFT HAND  Final   Special Requests   Final    BOTTLES DRAWN AEROBIC ONLY Blood Culture results may not be optimal due  to an inadequate volume of blood received in culture bottles   Culture NO GROWTH 5 DAYS  Final   Report Status 04/27/2017 FINAL  Final  Culture, blood (Routine X 2) w Reflex to ID Panel     Status: None   Collection Time: 04/22/17  5:42 PM  Result Value Ref Range Status   Specimen Description BLOOD LEFT HAND  Final   Special Requests IN PEDIATRIC BOTTLE Blood Culture adequate volume  Final   Culture NO GROWTH 5 DAYS  Final   Report Status 04/27/2017 FINAL  Final  Culture, sputum-assessment     Status: None   Collection Time: 04/22/17  6:04 PM  Result Value Ref Range Status   Specimen Description EXPECTORATED SPUTUM  Final   Special Requests Normal  Final   Sputum evaluation THIS SPECIMEN IS ACCEPTABLE FOR SPUTUM CULTURE  Final   Report Status 04/23/2017 FINAL  Final  Culture, respiratory (NON-Expectorated)     Status: None   Collection Time: 04/22/17  6:04 PM  Result Value Ref Range Status   Specimen Description EXPECTORATED SPUTUM  Final   Special Requests Normal Reflexed from [id number]  Final   Gram Stain   Final    ABUNDANT WBC PRESENT, PREDOMINANTLY PMN FEW GRAM NEGATIVE COCCOBACILLI FEW GRAM NEGATIVE RODS RARE GRAM POSITIVE COCCI IN CLUSTERS    Culture Consistent with normal respiratory flora.  Final   Report Status 04/25/2017 FINAL  Final  Respiratory Panel by PCR     Status: None   Collection Time: 04/23/17  3:54 PM  Result Value Ref Range Status   Adenovirus NOT DETECTED NOT DETECTED Final   Coronavirus 229E NOT DETECTED NOT DETECTED Final   Coronavirus HKU1 NOT DETECTED NOT DETECTED Final   Coronavirus NL63 NOT DETECTED NOT DETECTED Final   Coronavirus OC43 NOT DETECTED NOT DETECTED Final   Metapneumovirus NOT DETECTED NOT DETECTED Final   Rhinovirus / Enterovirus NOT DETECTED NOT DETECTED Final   Influenza A NOT DETECTED NOT DETECTED Final   Influenza B NOT DETECTED NOT DETECTED Final   Parainfluenza Virus 1 NOT DETECTED NOT DETECTED Final   Parainfluenza Virus 2 NOT  DETECTED NOT DETECTED Final   Parainfluenza Virus 3 NOT DETECTED NOT DETECTED Final   Parainfluenza Virus 4 NOT DETECTED NOT DETECTED Final   Respiratory Syncytial Virus NOT DETECTED NOT DETECTED Final   Bordetella pertussis NOT DETECTED NOT DETECTED Final   Chlamydophila pneumoniae NOT DETECTED NOT DETECTED Final   Mycoplasma pneumoniae NOT DETECTED NOT DETECTED Final   Time coordinating discharge: 40 mins  SIGNED:  Irwin Brakeman, MD  [facility] 04/29/2017, 1:25 PM Pager [phone]  If 7PM-7AM, please contact night-coverage [email or web address] Password [email or web address]

## 2017-04-29 NOTE — Progress Notes (Signed)
Pt continues to have urinary retention. Bladder scan for >500, RN and tech got pt up to commode. Pt voided 125. PVR 427. Notified MD on call. I/O cath ordered X1. 500 ml output w/ straight cath. Will continue to monitor.

## 2017-04-29 NOTE — Progress Notes (Signed)
Patient is in route to [facility]. [facility] for report. Danae Chen, LPN took report.  I have explained to her that patient will need to be monitored for urinary retention. She may require to be bladder scanned BID and catheterized as needed. The admitting nurse said that they don't do bladder scan at the the facility as far as she knows. She will call her director of nursing and get back with me if needed. I told her that per our CSW  they are able to do bladder scan at [facility]. I answered all questions and gave Danae Chen my contact number to call beck if there is any question.   Ferdinand Lango, RN.

## 2017-04-29 NOTE — Progress Notes (Signed)
Per d/c summary, pt will need to have "twice daily bladder scans to assess for urinary retention and place foley if needed".    Wilburn Cornelia, CSW was informed and she stated that they are able to perform bladder scans at [facility].  Ferdinand Lango, RN

## 2017-04-29 NOTE — ED Notes (Signed)
Called [facility] to verify which order they were unable to facilitate. [facility] states unable to bladder scan BID.

## 2017-04-29 NOTE — Clinical Social Work Note (Signed)
Clinical Social Worker facilitated patient discharge including contacting patient family and facility to confirm patient discharge plans. Clinical information faxed to facility and family agreeable with plan.  CSW arranged ambulance transport via [facility] to [facility].  RN to call report prior to discharge.  Clinical Social Worker will sign off for now as social work intervention is no longer needed. Please consult [place] again if new need arises.  Rochele Lueck B. Joline Maxcy Clinical Social Work Dept [employer] Social Worker [phone] 3:21 PM

## 2017-04-29 NOTE — ED Provider Notes (Signed)
[facility] DEPT Provider Note   CSN: [id number] Arrival date & time: 04/29/17  1718     History   Chief Complaint No chief complaint on file.   HPI [name] is a 83 y.o. female.  HPI   Patient discharged from [facility] earlier today and sent to [facility] facility where she was refused at the door and sent back due to orders they could not comply with there (bladder scanning BID).  Pt is unaware of any other problems and currently is feeling well, no complaints.    Past Medical History:  Diagnosis Date  . Anginal pain ([place])   . Arthritis    "comes and goes" (03/01/2015)  . Asthma   . CHF (congestive heart failure) ([place])   . Colon cancer ([place])   . Colostomy care ([place])   . COPD (chronic obstructive pulmonary disease) ([place])   . Diabetes mellitus without complication ([place])    pt denies this hx on 03/01/2015  . Heart murmur   . History of blood transfusion 03/01/2015; 04/23/2017   "related to anemia,"  . Hyperlipidemia   . Hypertension   . Hypothyroidism   . Pneumonia    "just once" (03/01/2015)  . Pneumonia 04/23/2017  . Presence of combination internal cardiac defibrillator (ICD) and pacemaker    2005 in [facility]  . Pulmonary embolism ([place])   . Renal disorder     Patient Active Problem List   Diagnosis Date Noted  . Pneumonia 04/22/2017  . HCAP (healthcare-associated pneumonia) 04/22/2017  . Altered mental status   . Hypotension 02/16/2017  . Syncope 02/02/2016  . Faintness   . Essential hypertension 11/16/2015  . Hypothyroidism 11/16/2015  . Acute on chronic systolic (congestive) heart failure ([place]) 11/15/2015  . Hypertensive heart disease 10/04/2015  . Elevated troponin 10/04/2015  . Acute respiratory failure ([place])   . Acute systolic congestive heart failure ([place])   . NICM (nonischemic cardiomyopathy) ([place]) 08/11/2015  . Normal coronary arteries 08/11/2015  . Dyslipidemia 07/27/2015  . CKD (chronic kidney disease) stage 3, GFR 30-59 ml/min 07/27/2015    . Anemia 07/27/2015  . Benign essential HTN 07/27/2015  . Hypothyroidism, adult 07/27/2015  . FUO (fever of unknown origin) 07/27/2015  . IDA (iron deficiency anemia) 07/27/2015  . Chronic systolic CHF (congestive heart failure) ([place]) 07/27/2015  . Colon cancer ([place]) 07/27/2015  . Sepsis ([place]) 07/24/2015  . Acute encephalopathy 07/24/2015  . Hypokalemia 07/24/2015  . Dementia 07/23/2015  . COPD (chronic obstructive pulmonary disease) ([place]) 04/25/2015  . ICD in place 04/14/2015  . Colostomy in place [facility]) 03/01/2015    Past Surgical History:  Procedure Laterality Date  . ABDOMINAL HYSTERECTOMY    . ANKLE FRACTURE SURGERY Left   . CARDIAC CATHETERIZATION N/A 03/22/2015   Procedure: Right/Left Heart Cath and Coronary Angiography;  Surgeon: Leonie Man, MD;  Location: [facility];  Service: Cardiovascular;  Laterality: N/A;  . CATARACT EXTRACTION W/ INTRAOCULAR LENS  IMPLANT, BILATERAL Bilateral   . CHOLECYSTECTOMY    . COLON SURGERY     cancer  . DILATION AND CURETTAGE OF UTERUS    . FRACTURE SURGERY    . TUBAL LIGATION      OB History    No data available       Home Medications    Prior to Admission medications   Medication Sig Start Date End Date Taking? Authorizing Provider  acetaminophen (TYLENOL) 325 MG tablet Take 650 mg by mouth every 6 (six) hours as needed (  pain).    [provider]  albuterol (PROVENTIL) (2.5 MG/3ML) 0.083% nebulizer solution Take 3 mLs (2.5 mg total) by nebulization every 4 (four) hours as needed for wheezing. 04/28/15   Geradine Girt, DO  aspirin EC 81 MG tablet Take 1 tablet (81 mg total) by mouth every morning. Patient taking differently: Take 81 mg by mouth daily.  03/03/15   Rai, Vernelle Emerald, MD  atorvastatin (LIPITOR) 20 MG tablet Take 20 mg by mouth daily.     [provider]  benzonatate (TESSALON) 100 MG capsule Take 1 capsule (100 mg total) by mouth 3 (three) times daily as needed for cough. 01/15/17   Horton,  Barbette Hair, MD  carboxymethylcellulose (REFRESH) 1 % ophthalmic solution Place 1 drop into both eyes 3 (three) times daily.    [provider]  carvedilol (COREG) 3.125 MG tablet Take 1 tablet (3.125 mg total) by mouth 2 (two) times daily with a meal. 02/04/16   [place]nell Public, MD  doxycycline (VIBRA-TABS) 100 MG tablet Take 1 tablet (100 mg total) by mouth every 12 (twelve) hours. 04/30/17 05/03/17  Johnson, Clanford L, MD  Fluticasone-Salmeterol (ADVAIR) 500-50 MCG/DOSE AEPB Inhale 1 puff into the lungs 2 (two) times daily. Rinse mouth after use    [provider]  furosemide (LASIX) 40 MG tablet Take 1 tablet (40 mg total) by mouth daily. 12/30/15   Shirley Friar, PA-C  hydrALAZINE (APRESOLINE) 25 MG tablet Take 0.5 tablets (12.5 mg total) by mouth 3 (three) times daily. 12/23/15   Clegg, Amy D, NP  HYDROcodone-acetaminophen (NORCO/VICODIN) 5-325 MG tablet Take 1 tablet by mouth every 4 (four) hours as needed (pain). 04/29/17   Johnson, Clanford L, MD  iron polysaccharides (FERREX 150) 150 MG capsule Take 150 mg by mouth 2 (two) times daily.    [provider]  isosorbide mononitrate (IMDUR) 30 MG 24 hr tablet Take 1 tablet (30 mg total) by mouth daily. 03/24/15   Dhungel, Nishant, MD  Lactobacillus (FLORANEX PO) Take 1 tablet by mouth 3 (three) times daily with meals.    [provider]  levothyroxine (SYNTHROID, LEVOTHROID) 50 MCG tablet Take 50 mcg by mouth daily.  01/16/15   [provider]  lisinopril (PRINIVIL,ZESTRIL) 10 MG tablet Take 1 tablet (10 mg total) by mouth daily. 04/29/17   Johnson, Clanford L, MD  loratadine (CLARITIN) 10 MG tablet Take 10 mg by mouth daily.    [provider]  oxymetazoline (AFRIN) 0.05 % nasal spray Place 1 spray into both nostrils 2 (two) times daily as needed (for nose bleeds). Reported on 12/23/2015    [provider]  potassium chloride (K-DUR) 10 MEQ tablet Take 1 tablet (10 mEq total) by  mouth daily. 10/28/15   Noemi Chapel, MD  ranitidine (ZANTAC) 150 MG tablet Take 150 mg by mouth daily.  01/14/15   [provider]  tamsulosin (FLOMAX) 0.4 MG CAPS capsule Take 1 capsule (0.4 mg total) by mouth daily after supper. 04/29/17   Murlean Iba, MD    Family History Family History  Problem Relation Age of Onset  . CAD Unknown   . Hypertension Unknown   . Stroke Unknown   . Colon cancer Neg Hx   . GI Bleed Neg Hx     Social History Social History  Substance Use Topics  . Smoking status: Never Smoker  . Smokeless tobacco: Never Used  . Alcohol use No     Allergies   Patient has no  known allergies.   Review of Systems Review of Systems  Respiratory: Negative for shortness of breath.   Cardiovascular: Negative for chest pain.  Gastrointestinal: Negative for abdominal pain.     Physical Exam Updated Vital Signs BP (!) 142/66   Pulse 90   Temp 99.3 F (37.4 C) (Oral)   Resp 17   SpO2 98%   Physical Exam  Constitutional: She appears well-developed and well-nourished. No distress.  HENT:  Head: Normocephalic and atraumatic.  Neck: Neck supple.  Pulmonary/Chest: Effort normal.  Neurological: She is alert.  Skin: She is not diaphoretic.  Nursing note and vitals reviewed.    ED Treatments / Results  Labs (all labs ordered are listed, but only abnormal results are displayed) Labs Reviewed - No data to display  EKG  EKG Interpretation None       Radiology Dg Chest 2 View  Result Date: 04/28/2017 CLINICAL DATA:  Fever.  Pneumonia.  Increasing dyspnea. EXAM: CHEST  2 VIEW COMPARISON:  Chest radiograph 04/26/2017.  Chest CTA 04/22/2017 FINDINGS: Left-sided pacemaker remains in place. Again seen cardiomegaly. Stable mediastinal contours. There is elevation of right hemidiaphragm with adjacent compressive atelectasis. Retrocardiac opacity corresponding to consolidation and trace left pleural effusion on prior CT, similar to prior exam.  Minimal right infrahilar opacity. No new airspace disease. Pulmonary vasculature is stable. No pneumothorax. The bones are under mineralized. IMPRESSION: 1. Stable retrocardiac opacity in right infrahilar opacity, suspicious for pneumonia. No significant change from prior exam. Given distribution, aspiration is considered. 2. Stable cardiomegaly. 3. Chronic elevation of right hemidiaphragm with adjacent atelectasis. Electronically Signed   By: Jeb Levering M.D.   On: 04/28/2017 21:40   Dg Hip Unilat With Pelvis 2-3 Views Right  Result Date: 04/28/2017 CLINICAL DATA:  Acute right hip pain. EXAM: DG HIP (WITH OR WITHOUT PELVIS) 2-3V RIGHT COMPARISON:  None. FINDINGS: The cortical margins of the bony pelvis are intact. No fracture. Pubic symphysis and sacroiliac joints are congruent. Both femoral heads are well-seated in the respective acetabula. Mild osteoarthritis of the right hip with acetabular spurring and femoral head osteophytes. Moderate sacroiliac joint degenerative change. 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 No evidence of acute osseous abnormality. Electronically Signed   By: Jeb Levering M.D.   On: 04/28/2017 21:42    Procedures Procedures (including critical care time)  Medications Ordered in ED Medications - No data to display   Initial Impression / Assessment and Plan / ED Course  I have reviewed the triage vital signs and the nursing notes.  Pertinent labs & imaging results that were available during my care of the patient were reviewed by me and considered in my medical decision making (see chart for details).  Clinical Course as of Apr 29 1942  Sun Apr 29, 2017  1739 Paged [facility] who discharged patient earlier today.  [EW]  [id number] I spoke with social work and Dr Canary Brim spoke with Dr Wynetta Emery who have already sorted out the situation.  Pt to go back to [facility].    [EW]    Clinical Course User Index [EW] Clayton Bibles, [place]     Pt with visit to ED not for a medical problem but due to miscommunication between the hospital and the SNF.  Please see social work and other notes today regarding these conversations.  Corrections made and patient sent back to [facility] facility as previously intended.  Pt without any complaints or problems while in ED.    Final Clinical  Impressions(s) / ED Diagnoses   Final diagnoses:  Resides in skilled nursing facility    New Prescriptions Discharge Medication List as of 04/29/2017  6:34 PM       Clayton Bibles, Hershal Coria 04/29/17 1944    Alfonzo Beers, MD 04/29/17 1950

## 2017-04-29 NOTE — Clinical Social Work Placement (Signed)
   CLINICAL SOCIAL WORK PLACEMENT  NOTE  Date:  04/29/2017  Patient Details  Name: [name] MRN: [id number] Date of Birth: 1934-04-05  Clinical Social Work is seeking post-discharge placement for this patient at the [place] level of care (*CSW will initial, date and re-position this form in  chart as items are completed):  Yes   Patient/family provided with [place] Work Department's list of facilities offering this level of care within the geographic area requested by the patient (or if unable, by the patient's family).  Yes   Patient/family informed of their freedom to choose among providers that offer the needed level of care, that participate in Medicare, Medicaid or managed care program needed by the patient, have an available bed and are willing to accept the patient.  Yes   Patient/family informed of [facility]'s ownership interest in [facility] and [facility], as well as of the fact that they are under no obligation to receive care at these facilities.  PASRR submitted to EDS on       PASRR number received on       Existing PASRR number confirmed on 04/26/17     FL2 transmitted to all facilities in geographic area requested by pt/family on 04/26/17     FL2 transmitted to all facilities within larger geographic area on       Patient informed that his/her managed care company has contracts with or will negotiate with certain facilities, including the following:            Patient/family informed of bed offers received.  Patient chooses bed at  [facility])     Physician recommends and patient chooses bed at      Patient to be transferred to  [facility]) on 04/29/17.  Patient to be transferred to facility by  [name])     Patient family notified on 04/29/17 of transfer.  Name of family member notified:  [name] B. Owens Shark MSW LCSWA DP     PHYSICIAN       Additional Comment:     _______________________________________________ Serafina Mitchell, LCSWA 04/29/2017, 3:22 PM

## 2017-04-29 NOTE — ED Notes (Signed)
Report given to RN at [facility], updated on confusion per Social Work, MD and Merchant navy officer at [facility] about parameters for orders.  Verified patient okay to return.

## 2017-04-29 NOTE — ED Notes (Signed)
Per social work, pt to go back to [facility]. Social work states there were alternative orders already in place. Raquel Sarna, [place], made aware.

## 2017-04-29 NOTE — Clinical Social Work Note (Signed)
CSW received call from [place], admissions @ [facility] who indicated they could not accept pt and facility was sending pt back. Suanne Marker reported that they cannot do bladder scans per ADON, CSW's previous conversation with Admissions, there was NOT a problem accepting this pt with SCAN/Foley if needed. Suanne Marker also reported when nurse [facility] @ [facility] gave report to [facility] @ [facility] and asked nurse if they could do in/out every 4 to 6 hrs and was told NO. According to [facility] instructed nurse not to send pt at this time and was told that pt was going to be sent anyway.  DC Summary indicates what facility needs to do if they can't do bladder scans, Suanne Marker reports she didn't look at the DC Summary, only printed it. Facility obviously not aware of this and sent pt back to ED per their ADON. CSW spoke to MD who was willing to type additional not indicating bladder scans were NOT mandatory and for the facility to watch pt's urine outpt, if any issues put in foley..  MD had conversation with ADON @ facility and cleared confusion. Pt now in ED, but ok to send back per facility. CSW informed BSRN in ED and PA in ED, ok for pt to go back to [facility].  Nubia Ziesmer B. Joline Maxcy Clinical Social Work Dept Weekend Social Worker [phone] 5:32 PM

## 2017-04-29 NOTE — ED Notes (Signed)
[facility] contacted to transport patient back to [facility] 

## 2017-04-29 NOTE — Discharge Instructions (Signed)
Follow with Primary MD  Reymundo Poll, MD  and other consultant's as instructed your Hospitalist MD  Please get a complete blood count and chemistry panel checked by your Primary MD at your next visit, and again as instructed by your Primary MD.  Get Medicines reviewed and adjusted: Please take all your medications with you for your next visit with your Primary MD  Laboratory/radiological data: Please request your Primary MD to go over all hospital tests and procedure/radiological results at the follow up, please ask your Primary MD to get all Hospital records sent to his/her office.  In some cases, they will be blood work, cultures and biopsy results pending at the time of your discharge. Please request that your primary care M.D. follows up on these results.  Also Note the following: If you experience worsening of your admission symptoms, develop shortness of breath, life threatening emergency, suicidal or homicidal thoughts you must seek medical attention immediately by calling 911 or calling your MD immediately  if symptoms less severe.  You must read complete instructions/literature along with all the possible adverse reactions/side effects for all the Medicines you take and that have been prescribed to you. Take any new Medicines after you have completely understood and accpet all the possible adverse reactions/side effects.   Do not drive when taking Pain medications or sleeping medications (Benzodaizepines)  Do not take more than prescribed Pain, Sleep and Anxiety Medications. It is not advisable to combine anxiety,sleep and pain medications without talking with your primary care practitioner  Special Instructions: If you have smoked or chewed Tobacco  in the last 2 yrs please stop smoking, stop any regular Alcohol  and or any Recreational drug use.  Wear Seat belts while driving.  Please note: You were cared for by a hospitalist during your hospital stay. Once you are discharged,  your primary care physician will handle any further medical issues. Please note that NO REFILLS for any discharge medications will be authorized once you are discharged, as it is imperative that you return to your primary care physician (or establish a relationship with a primary care physician if you do not have one) for your post hospital discharge needs so that they can reassess your need for medications and monitor your lab values.

## 2017-04-29 NOTE — Care Management Note (Signed)
Case Management Note  Patient Details  Name: [name] MRN: [id number] Date of Birth: 1933-12-17  Subjective/Objective:                 DC to SNF as facilitated by CSW   Action/Plan:   Expected Discharge Date:  04/29/17               Expected Discharge Plan:  [place]  In-House Referral:  Clinical Social Work  Discharge planning Services  NA  Post Acute Care Choice:  NA Choice offered to:  NA  DME Arranged:  N/A DME Agency:  NA  HH Arranged:  NA HH Agency:  NA  Status of Service:  Completed, signed off  If discussed at [employer], dates discussed:    Additional Comments:  Carles Collet, RN 04/29/2017, 3:16 PM

## 2017-04-29 NOTE — ED Triage Notes (Signed)
Pt arrives to the ED via [facility] from [place]. Pt was just discharged to [facility] from [facility] did not accept pt due to discrepancy on an order. [facility] told [facility] pt needed to go back to [facility]. Pt in NAD.

## 2017-04-29 NOTE — Progress Notes (Addendum)
Pt has not voided since she had an in and out  bladder catheterization this morning at 6 am.  Bladder scan shows approximately  100 cc of urine in the bladder. Will continue to monitor.  Ferdinand Lango, RN

## 2017-04-29 NOTE — Progress Notes (Signed)
04/29/2017 5:31 PM  I spoke with DON at [facility] and reassured them that the bladder scans are not required.   I would like for them to monitor urine output and only place a foley if needed for signs of urinary retention.  DC bladder scan order and updated discharge summary.    Murvin Natal MD

## 2017-05-01 ENCOUNTER — Non-Acute Institutional Stay (SKILLED_NURSING_FACILITY): Payer: Medicare HMO | Admitting: Internal Medicine

## 2017-05-01 ENCOUNTER — Encounter: Payer: Self-pay | Admitting: Internal Medicine

## 2017-05-01 DIAGNOSIS — I5023 Acute on chronic systolic (congestive) heart failure: Secondary | ICD-10-CM | POA: Diagnosis not present

## 2017-05-01 DIAGNOSIS — J9601 Acute respiratory failure with hypoxia: Secondary | ICD-10-CM

## 2017-05-01 DIAGNOSIS — J189 Pneumonia, unspecified organism: Secondary | ICD-10-CM | POA: Diagnosis not present

## 2017-05-01 NOTE — Assessment & Plan Note (Signed)
Continue doxycycline until 7/26

## 2017-05-01 NOTE — Patient Instructions (Signed)
See assessment and plan under each diagnosis in the problem list and acutely for this visit 

## 2017-05-01 NOTE — Progress Notes (Signed)
NURSING HOME LOCATION:  [facility] ROOM NUMBER:  [id number]   CODE STATUS:  Full Code  PCP:  Reymundo Poll, MD  [address] [place] [id number]   This is a comprehensive admission note to [facility] performed on this date less than 30 days from date of admission. Included are preadmission medical/surgical history;reconciled medication list; family history; social history and comprehensive review of systems.  Corrections and additions to the records were documented . Comprehensive physical exam was also performed. Additionally a clinical summary was entered for each active diagnosis pertinent to this admission in the Problem List to enhance continuity of care.  HPI: Patient was hospitalized 7/15-7/22/18 for progressive dyspnea in context of pneumonia. O2 sats were 70% when EMS arrived. The patient was placed on nonrebreather and CPAP which improved the O2 sats to 96% Imaging revealed multifocal pneumonia and small left effusion. The patient was there for acute hypoxic respiratory failure. The patient responded to pulmonary toilet & antibiotics with resolution of the hypoxic failure. The patient was discharged on doxycycline until 7/26. She was discharged to SNF for PT/OT. Troponin was elevated at admission was felt to be due to demand ischemia. She was asymptomatic from a cardiac standpoint. Carvedilol, furosemide, and hydralazine were continued for the chronic systolic and diastolic congestive heart failure. ACE-I dose was decreased because of increased creatinine of 1.26 with GFR 30-59. Discharge labs 04/29/17 revealed essentially stable but elevated white count of 13,000 with slight left shift. Anemia was stable to slightly improved with hemoglobin 9.3 & hematocrit 30.8, a normocytic, hypochromic anemia.  Past medical and surgical history: Includes COPD, history of PE, dyslipidemia, hypothyroidism and dementia. She has an ICD and pacemaker. She has colostomy  related to previous history of colon cancer.  Social history: Nonsmoker, nondrinker.  Family history: Reviewed, noncontributory due to age  Review of systems: She states "I don't feel good". She goes on to say "I'm too hot" .She also describes fatigue. She has rhinitis without other upper respiratory tract symptoms. She also has sweats without fever or rigor. All sputum is clear. She states she previously had some hemoptysis. She states that she had chronic weakness of left upper extremity related to "having a pacemaker". Her stool is dark related to iron supplementation   Constitutional: No fever Eyes: No redness, discharge, pain, vision change ENT/mouth: No purulent discharge, earache,change in hearing ,sore throat  Cardiovascular: No chest pain, palpitations,paroxysmal nocturnal dyspnea, claudication, edema  Gastrointestinal: No heartburn,dysphagia,abdominal pain, nausea / vomiting,rectal bleeding, melena Genitourinary: No dysuria,hematuria, pyuria,  incontinence, nocturia Musculoskeletal: No joint stiffness, joint swelling, pain Dermatologic: No rash, pruritus, change in appearance of skin Neurologic: No dizziness,headache,syncope, seizures, numbness , tingling Psychiatric: No significant anxiety , depression, insomnia, anorexia Endocrine: No change in hair/skin/ nails, excessive thirst, excessive hunger, excessive urination  Hematologic/lymphatic: No significant bruising, lymphadenopathy,abnormal bleeding Allergy/immunology: No itchy/ watery eyes, urticaria, angioedema  Physical exam:  Pertinent or positive findings: She wears only the upper plate; the mandible is edentulous.Marland Kitchen Heart sounds are somewhat distant. She does have a pacemaker. Minimal rales noted on the right. Ostomy is present in the left abdomen. She has 1+ edema. Posterior tibial pulses are decreased. Strength in left upper extremity slightly decreased.  General appearance:Adequately nourished; no acute distress ,  increased work of breathing is present.   Lymphatic: No lymphadenopathy about the head, neck, axilla . Eyes: No conjunctival inflammation or lid edema is present. There is no scleral icterus. Ears:  External ear exam  shows no significant lesions or deformities.   Nose:  External nasal examination shows no deformity or inflammation. Nasal mucosa are pink and moist without lesions ,exudates Oral exam: lips and gums are healthy appearing.There is no oropharyngeal erythema or exudate . Neck:  No thyromegaly, masses, tenderness noted.    Heart:  Normal rate and regular rhythm. S1 and S2 normal without gallop, murmur, click, rub .  Lungs:without wheezes, rhonchi , rubs. Abdomen:Bowel sounds are normal. Abdomen is soft and nontender with no organomegaly, hernias,masses. GU: deferred  Extremities:  No cyanosis, clubbing  Neurologic exam : Balance,Rhomberg,finger to nose testing could not be completed due to clinical state Skin: Warm & dry w/o tenting. No significant lesions or rash. Large bruise L antecubital area.  See clinical summary under each active problem in the Problem List with associated updated therapeutic plan

## 2017-05-02 ENCOUNTER — Encounter: Payer: Self-pay | Admitting: Internal Medicine

## 2017-05-02 NOTE — Assessment & Plan Note (Signed)
No change in present regimen unless blood pressure remains elevated. ACE inhibitor dose will be adjusted based on renal function and blood pressure

## 2017-05-02 NOTE — Assessment & Plan Note (Addendum)
Clinically stable at this time with O2 sats of 97% on room air, continue pulmonary toilet

## 2017-05-04 ENCOUNTER — Other Ambulatory Visit: Payer: Self-pay

## 2017-05-04 MED ORDER — HYDROCODONE-ACETAMINOPHEN 5-325 MG PO TABS: 1.0000 | ORAL_TABLET | ORAL | 0 refills | Status: DC | PRN

## 2017-05-04 NOTE — Telephone Encounter (Signed)
[place] form completed and faxed to [phone]

## 2017-05-12 DIAGNOSIS — N183 Chronic kidney disease, stage 3 (moderate): Secondary | ICD-10-CM | POA: Diagnosis not present

## 2017-05-12 DIAGNOSIS — D649 Anemia, unspecified: Secondary | ICD-10-CM | POA: Diagnosis not present

## 2017-05-12 DIAGNOSIS — F0280 Dementia in other diseases classified elsewhere without behavioral disturbance: Secondary | ICD-10-CM | POA: Diagnosis not present

## 2017-05-12 DIAGNOSIS — Z433 Encounter for attention to colostomy: Secondary | ICD-10-CM | POA: Diagnosis not present

## 2017-05-12 DIAGNOSIS — I272 Pulmonary hypertension, unspecified: Secondary | ICD-10-CM | POA: Diagnosis not present

## 2017-05-12 DIAGNOSIS — J449 Chronic obstructive pulmonary disease, unspecified: Secondary | ICD-10-CM | POA: Diagnosis not present

## 2017-05-12 DIAGNOSIS — I5042 Chronic combined systolic (congestive) and diastolic (congestive) heart failure: Secondary | ICD-10-CM | POA: Diagnosis not present

## 2017-05-12 DIAGNOSIS — G309 Alzheimer's disease, unspecified: Secondary | ICD-10-CM | POA: Diagnosis not present

## 2017-10-26 ENCOUNTER — Other Ambulatory Visit (HOSPITAL_COMMUNITY): Payer: Self-pay | Admitting: Internal Medicine

## 2017-10-26 DIAGNOSIS — R1310 Dysphagia, unspecified: Secondary | ICD-10-CM

## 2017-10-28 ENCOUNTER — Other Ambulatory Visit: Payer: Self-pay

## 2017-10-28 ENCOUNTER — Inpatient Hospital Stay (HOSPITAL_COMMUNITY)
Admission: EM | Admit: 2017-10-28 | Discharge: 2017-11-02 | DRG: 291 | Disposition: A | Discharge status: Home Health | Payer: Medicare HMO | Attending: Family Medicine | Admitting: Family Medicine

## 2017-10-28 ENCOUNTER — Encounter (HOSPITAL_COMMUNITY): Payer: Self-pay | Admitting: Emergency Medicine

## 2017-10-28 ENCOUNTER — Emergency Department (HOSPITAL_COMMUNITY): Payer: Medicare HMO

## 2017-10-28 DIAGNOSIS — R079 Chest pain, unspecified: Secondary | ICD-10-CM | POA: Diagnosis present

## 2017-10-28 DIAGNOSIS — I130 Hypertensive heart and chronic kidney disease with heart failure and stage 1 through stage 4 chronic kidney disease, or unspecified chronic kidney disease: Principal | ICD-10-CM | POA: Diagnosis present

## 2017-10-28 DIAGNOSIS — Z933 Colostomy status: Secondary | ICD-10-CM

## 2017-10-28 DIAGNOSIS — Z7951 Long term (current) use of inhaled steroids: Secondary | ICD-10-CM

## 2017-10-28 DIAGNOSIS — R600 Localized edema: Secondary | ICD-10-CM | POA: Diagnosis not present

## 2017-10-28 DIAGNOSIS — Z79890 Hormone replacement therapy (postmenopausal): Secondary | ICD-10-CM

## 2017-10-28 DIAGNOSIS — J449 Chronic obstructive pulmonary disease, unspecified: Secondary | ICD-10-CM | POA: Diagnosis present

## 2017-10-28 DIAGNOSIS — Z85038 Personal history of other malignant neoplasm of large intestine: Secondary | ICD-10-CM

## 2017-10-28 DIAGNOSIS — Z7982 Long term (current) use of aspirin: Secondary | ICD-10-CM

## 2017-10-28 DIAGNOSIS — R52 Pain, unspecified: Secondary | ICD-10-CM

## 2017-10-28 DIAGNOSIS — Z95810 Presence of automatic (implantable) cardiac defibrillator: Secondary | ICD-10-CM

## 2017-10-28 DIAGNOSIS — R0789 Other chest pain: Secondary | ICD-10-CM

## 2017-10-28 DIAGNOSIS — Z86711 Personal history of pulmonary embolism: Secondary | ICD-10-CM

## 2017-10-28 DIAGNOSIS — Z9049 Acquired absence of other specified parts of digestive tract: Secondary | ICD-10-CM

## 2017-10-28 DIAGNOSIS — N183 Chronic kidney disease, stage 3 unspecified: Secondary | ICD-10-CM | POA: Diagnosis present

## 2017-10-28 DIAGNOSIS — R05 Cough: Secondary | ICD-10-CM

## 2017-10-28 DIAGNOSIS — E039 Hypothyroidism, unspecified: Secondary | ICD-10-CM | POA: Diagnosis present

## 2017-10-28 DIAGNOSIS — Z9841 Cataract extraction status, right eye: Secondary | ICD-10-CM

## 2017-10-28 DIAGNOSIS — I10 Essential (primary) hypertension: Secondary | ICD-10-CM | POA: Diagnosis present

## 2017-10-28 DIAGNOSIS — J438 Other emphysema: Secondary | ICD-10-CM

## 2017-10-28 DIAGNOSIS — I5023 Acute on chronic systolic (congestive) heart failure: Secondary | ICD-10-CM | POA: Diagnosis not present

## 2017-10-28 DIAGNOSIS — R011 Cardiac murmur, unspecified: Secondary | ICD-10-CM | POA: Diagnosis present

## 2017-10-28 DIAGNOSIS — M1990 Unspecified osteoarthritis, unspecified site: Secondary | ICD-10-CM | POA: Diagnosis present

## 2017-10-28 DIAGNOSIS — E1122 Type 2 diabetes mellitus with diabetic chronic kidney disease: Secondary | ICD-10-CM | POA: Diagnosis present

## 2017-10-28 DIAGNOSIS — I428 Other cardiomyopathies: Secondary | ICD-10-CM

## 2017-10-28 DIAGNOSIS — E785 Hyperlipidemia, unspecified: Secondary | ICD-10-CM | POA: Diagnosis present

## 2017-10-28 DIAGNOSIS — Z961 Presence of intraocular lens: Secondary | ICD-10-CM | POA: Diagnosis present

## 2017-10-28 DIAGNOSIS — D649 Anemia, unspecified: Secondary | ICD-10-CM | POA: Diagnosis present

## 2017-10-28 DIAGNOSIS — T17998A Other foreign object in respiratory tract, part unspecified causing other injury, initial encounter: Secondary | ICD-10-CM

## 2017-10-28 DIAGNOSIS — Z90710 Acquired absence of both cervix and uterus: Secondary | ICD-10-CM

## 2017-10-28 DIAGNOSIS — I5043 Acute on chronic combined systolic (congestive) and diastolic (congestive) heart failure: Secondary | ICD-10-CM | POA: Diagnosis present

## 2017-10-28 DIAGNOSIS — R059 Cough, unspecified: Secondary | ICD-10-CM

## 2017-10-28 DIAGNOSIS — Z9842 Cataract extraction status, left eye: Secondary | ICD-10-CM

## 2017-10-28 DIAGNOSIS — R1310 Dysphagia, unspecified: Secondary | ICD-10-CM

## 2017-10-28 LAB — CBC WITH DIFFERENTIAL/PLATELET
BASOS ABS: 0 10*3/uL (ref 0.0–0.1)
BASOS PCT: 1 %
Eosinophils Absolute: 0.1 10*3/uL (ref 0.0–0.7)
Eosinophils Relative: 2 %
HEMATOCRIT: 30.5 % — AB (ref 36.0–46.0)
Hemoglobin: 9.4 g/dL — ABNORMAL LOW (ref 12.0–15.0)
Lymphocytes Relative: 38 %
Lymphs Abs: 1.7 10*3/uL (ref 0.7–4.0)
MCH: 27.4 pg (ref 26.0–34.0)
MCHC: 30.8 g/dL (ref 30.0–36.0)
MCV: 88.9 fL (ref 78.0–100.0)
Monocytes Absolute: 0.4 10*3/uL (ref 0.1–1.0)
Monocytes Relative: 8 %
NEUTROS ABS: 2.4 10*3/uL (ref 1.7–7.7)
Neutrophils Relative %: 53 %
Platelets: 231 10*3/uL (ref 150–400)
RBC: 3.43 MIL/uL — ABNORMAL LOW (ref 3.87–5.11)
RDW: 15.4 % (ref 11.5–15.5)
WBC: 4.6 10*3/uL (ref 4.0–10.5)

## 2017-10-28 LAB — BASIC METABOLIC PANEL
Anion gap: 11 (ref 5–15)
BUN: 26 mg/dL — ABNORMAL HIGH (ref 6–20)
CALCIUM: 9.5 mg/dL (ref 8.9–10.3)
CO2: 23 mmol/L (ref 22–32)
Chloride: 105 mmol/L (ref 101–111)
Creatinine, Ser: 1.21 mg/dL — ABNORMAL HIGH (ref 0.44–1.00)
GFR calc non Af Amer: 41 mL/min — ABNORMAL LOW (ref >60)
GFR, EST AFRICAN AMERICAN: 47 mL/min — AB (ref >60)
Glucose, Bld: 79 mg/dL (ref 65–99)
Potassium: 4.3 mmol/L (ref 3.5–5.1)
Sodium: 139 mmol/L (ref 135–145)

## 2017-10-28 LAB — LIPID PANEL
CHOL/HDL RATIO: 2.5 ratio
CHOLESTEROL: 117 mg/dL (ref 0–200)
HDL: 46 mg/dL (ref >40)
LDL Cholesterol: 63 mg/dL (ref 0–99)
Triglycerides: 41 mg/dL (ref <150)
VLDL: 8 mg/dL (ref 0–40)

## 2017-10-28 LAB — URINALYSIS, ROUTINE W REFLEX MICROSCOPIC
Bilirubin Urine: NEGATIVE
GLUCOSE, UA: NEGATIVE mg/dL
Hgb urine dipstick: NEGATIVE
KETONES UR: NEGATIVE mg/dL
LEUKOCYTES UA: NEGATIVE
Nitrite: NEGATIVE
PROTEIN: NEGATIVE mg/dL
Specific Gravity, Urine: 1.01 (ref 1.005–1.030)
pH: 7 (ref 5.0–8.0)

## 2017-10-28 LAB — I-STAT TROPONIN, ED: Troponin i, poc: 0.02 ng/mL (ref 0.00–0.08)

## 2017-10-28 LAB — TROPONIN I
Troponin I: <0.03 ng/mL (ref <0.03)
Troponin I: <0.03 ng/mL (ref <0.03)

## 2017-10-28 LAB — MRSA PCR SCREENING: MRSA by PCR: NEGATIVE

## 2017-10-28 LAB — BRAIN NATRIURETIC PEPTIDE: B NATRIURETIC PEPTIDE 5: 208.1 pg/mL — AB (ref 0.0–100.0)

## 2017-10-28 MED ORDER — LEVOTHYROXINE SODIUM 50 MCG PO TABS
50.0000 ug | ORAL_TABLET | Freq: Every day | ORAL | Status: DC
  Administered 2017-10-28 – 2017-11-02 (×6): 50 ug via ORAL
  Filled 2017-10-28 (×6): qty 1

## 2017-10-28 MED ORDER — ISOSORBIDE MONONITRATE ER 30 MG PO TB24
30.0000 mg | ORAL_TABLET | Freq: Every day | ORAL | Status: DC
  Administered 2017-10-28 – 2017-11-02 (×6): 30 mg via ORAL
  Filled 2017-10-28 (×6): qty 1

## 2017-10-28 MED ORDER — FUROSEMIDE 10 MG/ML IJ SOLN
40.0000 mg | Freq: Once | INTRAMUSCULAR | Status: AC
  Administered 2017-10-28: 40 mg via INTRAVENOUS
  Filled 2017-10-28: qty 4

## 2017-10-28 MED ORDER — CARVEDILOL 6.25 MG PO TABS
6.2500 mg | ORAL_TABLET | Freq: Two times a day (BID) | ORAL | Status: DC
  Administered 2017-10-28 – 2017-11-02 (×11): 6.25 mg via ORAL
  Filled 2017-10-28 (×11): qty 1

## 2017-10-28 MED ORDER — MUPIROCIN 2 % EX OINT: 1.0000 "application " | TOPICAL_OINTMENT | Freq: Two times a day (BID) | CUTANEOUS | Status: DC

## 2017-10-28 MED ORDER — POTASSIUM CHLORIDE CRYS ER 10 MEQ PO TBCR
10.0000 meq | EXTENDED_RELEASE_TABLET | Freq: Every day | ORAL | Status: DC
  Administered 2017-10-29 – 2017-11-02 (×5): 10 meq via ORAL
  Filled 2017-10-28 (×6): qty 1

## 2017-10-28 MED ORDER — TAMSULOSIN HCL 0.4 MG PO CAPS
0.4000 mg | ORAL_CAPSULE | Freq: Every day | ORAL | Status: DC
  Administered 2017-10-28 – 2017-11-01 (×5): 0.4 mg via ORAL
  Filled 2017-10-28 (×5): qty 1

## 2017-10-28 MED ORDER — ATORVASTATIN CALCIUM 20 MG PO TABS
20.0000 mg | ORAL_TABLET | Freq: Every day | ORAL | Status: DC
  Administered 2017-10-28 – 2017-11-02 (×6): 20 mg via ORAL
  Filled 2017-10-28 (×6): qty 1
  Filled 2017-10-28: qty 2
  Filled 2017-10-28: qty 1

## 2017-10-28 MED ORDER — MOMETASONE FURO-FORMOTEROL FUM 200-5 MCG/ACT IN AERO
2.0000 | INHALATION_SPRAY | Freq: Two times a day (BID) | RESPIRATORY_TRACT | Status: DC
  Administered 2017-10-28 – 2017-11-02 (×9): 2 via RESPIRATORY_TRACT
  Filled 2017-10-28: qty 8.8

## 2017-10-28 MED ORDER — PANTOPRAZOLE SODIUM 40 MG PO TBEC
40.0000 mg | DELAYED_RELEASE_TABLET | Freq: Every day | ORAL | Status: DC
  Administered 2017-10-28 – 2017-11-02 (×6): 40 mg via ORAL
  Filled 2017-10-28 (×6): qty 1

## 2017-10-28 MED ORDER — ENOXAPARIN SODIUM 40 MG/0.4ML ~~LOC~~ SOLN
40.0000 mg | Freq: Every day | SUBCUTANEOUS | Status: DC
  Administered 2017-10-28: 40 mg via SUBCUTANEOUS
  Filled 2017-10-28 (×2): qty 0.4

## 2017-10-28 MED ORDER — LISINOPRIL 20 MG PO TABS
20.0000 mg | ORAL_TABLET | Freq: Every day | ORAL | Status: DC
  Administered 2017-10-28 – 2017-10-30 (×3): 20 mg via ORAL
  Filled 2017-10-28 (×3): qty 1

## 2017-10-28 MED ORDER — ACETAMINOPHEN 325 MG PO TABS
650.0000 mg | ORAL_TABLET | Freq: Four times a day (QID) | ORAL | Status: DC | PRN
  Administered 2017-10-28 – 2017-10-31 (×3): 650 mg via ORAL
  Filled 2017-10-28 (×3): qty 2

## 2017-10-28 MED ORDER — FUROSEMIDE 10 MG/ML IJ SOLN
40.0000 mg | Freq: Two times a day (BID) | INTRAMUSCULAR | Status: DC
  Administered 2017-10-28: 40 mg via INTRAVENOUS
  Filled 2017-10-28 (×2): qty 4

## 2017-10-28 MED ORDER — LORATADINE 10 MG PO TABS
10.0000 mg | ORAL_TABLET | Freq: Every day | ORAL | Status: DC
  Administered 2017-10-28 – 2017-11-02 (×6): 10 mg via ORAL
  Filled 2017-10-28 (×6): qty 1

## 2017-10-28 MED ORDER — BENZONATATE 100 MG PO CAPS
100.0000 mg | ORAL_CAPSULE | Freq: Three times a day (TID) | ORAL | Status: DC | PRN
  Administered 2017-10-28: 100 mg via ORAL
  Filled 2017-10-28: qty 1

## 2017-10-28 MED ORDER — CHLORHEXIDINE GLUCONATE CLOTH 2 % EX PADS: 6.0000 | MEDICATED_PAD | Freq: Every day | CUTANEOUS | Status: DC

## 2017-10-28 MED ORDER — POLYSACCHARIDE IRON COMPLEX 150 MG PO CAPS
150.0000 mg | ORAL_CAPSULE | Freq: Two times a day (BID) | ORAL | Status: DC
  Administered 2017-10-28 – 2017-11-02 (×11): 150 mg via ORAL
  Filled 2017-10-28 (×13): qty 1

## 2017-10-28 MED ORDER — GI COCKTAIL ~~LOC~~
30.0000 mL | Freq: Once | ORAL | Status: AC
  Administered 2017-10-28: 30 mL via ORAL
  Filled 2017-10-28: qty 30

## 2017-10-28 MED ORDER — OXYMETAZOLINE HCL 0.05 % NA SOLN: 1.0000 | Freq: Two times a day (BID) | NASAL | Status: DC | PRN

## 2017-10-28 MED ORDER — HYDRALAZINE HCL 25 MG PO TABS
12.5000 mg | ORAL_TABLET | Freq: Three times a day (TID) | ORAL | Status: DC
  Administered 2017-10-28 – 2017-11-01 (×11): 12.5 mg via ORAL
  Filled 2017-10-28 (×12): qty 1

## 2017-10-28 MED ORDER — MORPHINE SULFATE (PF) 4 MG/ML IV SOLN: 2.0000 mg | INTRAVENOUS | Status: DC | PRN

## 2017-10-28 MED ORDER — ASPIRIN EC 325 MG PO TBEC
325.0000 mg | DELAYED_RELEASE_TABLET | Freq: Every day | ORAL | Status: DC
  Administered 2017-10-29 – 2017-11-02 (×5): 325 mg via ORAL
  Filled 2017-10-28 (×5): qty 1

## 2017-10-28 MED ORDER — POLYVINYL ALCOHOL 1.4 % OP SOLN
1.0000 [drp] | Freq: Three times a day (TID) | OPHTHALMIC | Status: DC
  Administered 2017-10-28 – 2017-11-01 (×14): 1 [drp] via OPHTHALMIC
  Filled 2017-10-28: qty 15

## 2017-10-28 MED ORDER — ASPIRIN 81 MG PO CHEW
324.0000 mg | CHEWABLE_TABLET | Freq: Once | ORAL | Status: AC
  Administered 2017-10-28: 324 mg via ORAL
  Filled 2017-10-28: qty 4

## 2017-10-28 MED ORDER — IPRATROPIUM-ALBUTEROL 0.5-2.5 (3) MG/3ML IN SOLN
3.0000 mL | Freq: Four times a day (QID) | RESPIRATORY_TRACT | Status: AC
  Administered 2017-10-28 (×2): 3 mL via RESPIRATORY_TRACT
  Filled 2017-10-28 (×3): qty 3

## 2017-10-28 MED ORDER — ONDANSETRON HCL 4 MG/2ML IJ SOLN: 4.0000 mg | Freq: Four times a day (QID) | INTRAMUSCULAR | Status: DC | PRN

## 2017-10-28 MED ORDER — NITROGLYCERIN 0.4 MG SL SUBL: 0.4000 mg | SUBLINGUAL_TABLET | SUBLINGUAL | Status: DC | PRN

## 2017-10-28 NOTE — H&P (Signed)
History and Physical    [name] JJK:[id number] DOB: Apr 19, 1934 DOA: 10/28/2017  PCP: [name], PA Patient coming from: facility [facility])  Chief Complaint: sob  HPI: [name] is a delightful 84 y.o. female with medical history significant for a PDA not on home oxygen, PE not on anticoagulation, dyslipidemia, hypothyroidism, mild dementia, CAD status post ICD/pacemaker, colon cancer status post colostomy, anemia, combined systolic diastolic heart failure presents to the emergency department with complaints of shortness of breath, coughing and tightness in her chest. Initial evaluation reassuring for ACS but does reveal signs of volume overload. [facility] are asked to admit for chest pain rule out  Information is obtained from the chart and the patient noting that information from patient may be unreliable due to mild dementia. She states over the last week she has gotten "more and more swollen". She reports her feet and legs feel like "pins and needles" are in them. Associated symptoms include frequent cough especially with "drinking liquid" and weight gain. She states "I weight 208 never weighed that my life". He states she could hear herself wheezing. She states she gets "strangled" when drinking but not when eating food. She states then that "fluid backs up in my throat makes me cough". She denies chest pain pressure diaphoresis nausea vomiting. She denies orthopnea headache dizziness syncope or near-syncope. She denies diarrhea constipation dysuria hematuria frequency or urgency.  ED Course: In the emergency department she's afebrile hemodynamically stable and not hypoxic. She is provided with 325 mg of aspirin and 40 mg of Lasix intravenously.  Review of Systems: As per HPI otherwise all other systems reviewed and are negative.   Ambulatory Status: Ambulates with a walker with a very unsteady gait  Past Medical History:  Diagnosis Date  . Anginal pain ([place])   .  Arthritis    "comes and goes" (03/01/2015)  . Asthma   . CHF (congestive heart failure) ([place])   . Colon cancer ([place])   . Colostomy care ([place])   . COPD (chronic obstructive pulmonary disease) ([place])   . Diabetes mellitus without complication ([place])    pt denies this hx on 03/01/2015  . Heart murmur   . History of blood transfusion 03/01/2015; 04/23/2017   "related to anemia,"  . Hyperlipidemia   . Hypertension   . Hypothyroidism   . Pneumonia    "just once" (03/01/2015)  . Pneumonia 04/23/2017  . Presence of combination internal cardiac defibrillator (ICD) and pacemaker    2005 in [facility]  . Pulmonary embolism ([place])   . Renal disorder     Past Surgical History:  Procedure Laterality Date  . ABDOMINAL HYSTERECTOMY    . ANKLE FRACTURE SURGERY Left   . CARDIAC CATHETERIZATION N/A 03/22/2015   Procedure: Right/Left Heart Cath and Coronary Angiography;  Surgeon: Leonie Man, MD;  Location: [facility];  Service: Cardiovascular;  Laterality: N/A;  . CATARACT EXTRACTION W/ INTRAOCULAR LENS  IMPLANT, BILATERAL Bilateral   . CHOLECYSTECTOMY    . COLON SURGERY     cancer  . DILATION AND CURETTAGE OF UTERUS    . FRACTURE SURGERY    . TUBAL LIGATION      Social History   Socioeconomic History  . Marital status: Divorced    Spouse name: Not on file  . Number of children: Not on file  . Years of education: Not on file  . Highest education level: Not on file  Social Needs  . Financial resource strain: Not  on file  . Food insecurity - worry: Not on file  . Food insecurity - inability: Not on file  . Transportation needs - medical: Not on file  . Transportation needs - non-medical: Not on file  Occupational History  . Occupation: Retired Psychologist, counselling  Tobacco Use  . Smoking status: Never Smoker  . Smokeless tobacco: Never Used  Substance and Sexual Activity  . Alcohol use: No  . Drug use: No  . Sexual activity: No  Other Topics Concern  . Not on file  Social  History Narrative  . Not on file    No Known Allergies  Family History  Problem Relation Age of Onset  . CAD Unknown   . Hypertension Unknown   . Stroke Unknown   . Colon cancer Neg Hx   . GI Bleed Neg Hx     Prior to Admission medications   Medication Sig Start Date End Date Taking? Authorizing Provider  acetaminophen (TYLENOL) 325 MG tablet Take 650 mg by mouth every 6 (six) hours as needed (pain).   Yes [provider]  acetaminophen (TYLENOL) 500 MG tablet Take 1,000 mg by mouth 2 (two) times daily.   Yes [provider]  albuterol (PROVENTIL) (2.5 MG/3ML) 0.083% nebulizer solution Take 3 mLs (2.5 mg total) by nebulization every 4 (four) hours as needed for wheezing. 04/28/15  Yes Geradine Girt, DO  aspirin EC 81 MG tablet Take 1 tablet (81 mg total) by mouth every morning. 03/03/15  Yes Rai, Ripudeep K, MD  atorvastatin (LIPITOR) 20 MG tablet Take 20 mg by mouth daily.    Yes [provider]  benzonatate (TESSALON) 100 MG capsule Take 1 capsule (100 mg total) by mouth 3 (three) times daily as needed for cough. 01/15/17  Yes Horton, Barbette Hair, MD  carboxymethylcellulose (REFRESH) 1 % ophthalmic solution Place 1 drop into both eyes 3 (three) times daily.   Yes [provider]  carvedilol (COREG) 3.125 MG tablet Take 1 tablet (3.125 mg total) by mouth 2 (two) times daily with a meal. Patient taking differently: Take 6.25 mg by mouth 2 (two) times daily with a meal.  02/04/16  Yes Bonnell Public, MD  Fluticasone-Salmeterol (ADVAIR) 500-50 MCG/DOSE AEPB Inhale 1 puff into the lungs 2 (two) times daily. Rinse mouth after use   Yes [provider]  furosemide (LASIX) 40 MG tablet Take 1 tablet (40 mg total) by mouth daily. 12/30/15  Yes Shirley Friar, PA-C  hydrALAZINE (APRESOLINE) 25 MG tablet Take 0.5 tablets (12.5 mg total) by mouth 3 (three) times daily. 12/23/15  Yes Clegg, Amy D, NP  iron polysaccharides (FERREX 150) 150 MG  capsule Take 150 mg by mouth 2 (two) times daily.   Yes [provider]  isosorbide mononitrate (IMDUR) 30 MG 24 hr tablet Take 1 tablet (30 mg total) by mouth daily. 03/24/15  Yes Dhungel, Nishant, MD  Lactobacillus (FLORANEX PO) Take 1 tablet by mouth 3 (three) times daily with meals.   Yes [provider]  levothyroxine (SYNTHROID, LEVOTHROID) 50 MCG tablet Take 50 mcg by mouth daily.  01/16/15  Yes [provider]  lisinopril (PRINIVIL,ZESTRIL) 10 MG tablet Take 1 tablet (10 mg total) by mouth daily. Patient taking differently: Take 20 mg by mouth daily.  04/29/17  Yes Johnson, Clanford L, MD  loratadine (CLARITIN) 10 MG tablet Take 10 mg by mouth daily.   Yes [provider]  oxymetazoline (AFRIN) 0.05 % nasal spray Place 1 spray into  both nostrils 2 (two) times daily as needed (for nose bleeds). Reported on 12/23/2015   Yes [provider]  potassium chloride (K-DUR) 10 MEQ tablet Take 1 tablet (10 mEq total) by mouth daily. 10/28/15  Yes Noemi Chapel, MD  ranitidine (ZANTAC) 150 MG tablet Take 150 mg by mouth daily.  01/14/15  Yes [provider]  tamsulosin (FLOMAX) 0.4 MG CAPS capsule Take 1 capsule (0.4 mg total) by mouth daily after supper. 04/29/17  Yes Murlean Iba, MD    Physical Exam: Vitals:   10/28/17 0403 10/28/17 0618  BP: (!) 165/78 (!) 167/66  Pulse: 81 78  Resp: 20 16  Temp: 98.6 F (37 C)   TempSrc: Oral   SpO2: 100% 99%     General:  Appears calm and comfortable sitting up in bed in no acute distress Eyes:  PERRL, EOMI, normal lids, iris ENT:  grossly normal hearing, lips & tongue, mucous membranes of her mouth are moist and pain Neck:  no LAD, masses or thyromegaly Cardiovascular:  RRR, no m/r/g. Non-pitting LE edema. Edema and hands as well. Respiratory:  CTA bilaterally, no w/r/r. Normal respiratory effort. Abdomen:  soft, ntnd, positive bowel sounds colostomy intact Skin:  no rash or induration seen on  limited exam Musculoskeletal:  grossly normal tone BUE/BLE, good ROM, no bony abnormality Psychiatric:  grossly normal mood and affect, speech fluent and appropriate, AOx3 Neurologic:  CN 2-12 grossly intact, moves all extremities in coordinated fashion, sensation intact speech clear facial symmetry. Related to self and place  Labs on Admission: I have personally reviewed following labs and imaging studies  CBC: Recent Labs  Lab 10/28/17 0445  WBC 4.6  NEUTROABS 2.4  HGB 9.4*  HCT 30.5*  MCV 88.9  PLT 532   Basic Metabolic Panel: Recent Labs  Lab 10/28/17 0445  NA 139  K 4.3  CL 105  CO2 23  GLUCOSE 79  BUN 26*  CREATININE 1.21*  CALCIUM 9.5   GFR: CrCl cannot be calculated (Unknown ideal weight.). Liver Function Tests: No results for input(s): AST, ALT, ALKPHOS, BILITOT, PROT, ALBUMIN in the last 168 hours. No results for input(s): LIPASE, AMYLASE in the last 168 hours. No results for input(s): AMMONIA in the last 168 hours. Coagulation Profile: No results for input(s): INR, PROTIME in the last 168 hours. Cardiac Enzymes: No results for input(s): CKTOTAL, CKMB, CKMBINDEX, TROPONINI in the last 168 hours. BNP (last 3 results) No results for input(s): PROBNP in the last 8760 hours. HbA1C: No results for input(s): HGBA1C in the last 72 hours. CBG: No results for input(s): GLUCAP in the last 168 hours. Lipid Profile: No results for input(s): CHOL, HDL, LDLCALC, TRIG, CHOLHDL, LDLDIRECT in the last 72 hours. Thyroid Function Tests: No results for input(s): TSH, T4TOTAL, FREET4, T3FREE, THYROIDAB in the last 72 hours. Anemia Panel: No results for input(s): VITAMINB12, FOLATE, FERRITIN, TIBC, IRON, RETICCTPCT in the last 72 hours. Urine analysis:    Component Value Date/Time   COLORURINE YELLOW 04/26/2017 1406   APPEARANCEUR HAZY (A) 04/26/2017 1406   LABSPEC 1.016 04/26/2017 1406   PHURINE 5.0 04/26/2017 1406   GLUCOSEU NEGATIVE 04/26/2017 1406   HGBUR NEGATIVE  04/26/2017 1406   BILIRUBINUR NEGATIVE 04/26/2017 1406   KETONESUR NEGATIVE 04/26/2017 1406   PROTEINUR 100 (A) 04/26/2017 1406   UROBILINOGEN 0.2 07/23/2015 1610   NITRITE NEGATIVE 04/26/2017 1406   LEUKOCYTESUR NEGATIVE 04/26/2017 1406    Creatinine Clearance: CrCl cannot be calculated (Unknown ideal weight.).  Sepsis Labs: @  LABRCNTIP(procalcitonin:4,lacticidven:4) )No results found for this or any previous visit (from the past 240 hour(s)).   Radiological Exams on Admission: Dg Chest 2 View  Result Date: 10/28/2017 CLINICAL DATA:  84 year old female with shortness of breath. EXAM: CHEST  2 VIEW COMPARISON:  Chest radiograph dated 04/28/2017 FINDINGS: There is shallow inspiration. An area of increased density at the left lung base similar to prior radiograph likely combination of cardiomegaly and atelectatic changes. Underlying pneumonia is not excluded. Clinical correlation is recommended. The right lung is clear. No pneumothorax. Left pectoral AICD device. No acute osseous pathology. IMPRESSION: 1. Shallow inspiration. Left lung base. Infiltrate is not excluded. Clinical correlation is recommended. 2. Stable cardiomegaly.  No vascular congestion. Electronically Signed   By: Anner Crete M.D.   On: 10/28/2017 04:56    EKG:  Sinus rhythm Prolonged PR interval Left atrial enlargement Left bundle branch block Lateral infarct, recent Probable anteroseptal infarct, recent Baseline wander in lead(s) V5  Assessment/Plan Principal Problem:   Acute on chronic systolic CHF (congestive heart failure) (HCC) Active Problems:   COPD (chronic obstructive pulmonary disease) (HCC)   CKD (chronic kidney disease) stage 3, GFR 30-59 ml/min (HCC)   Benign essential HTN   NICM (nonischemic cardiomyopathy) (HCC)   Hypothyroidism   Chest pain   Aspiration of liquid   #1. Acute on chronic systolic heart failure. BNP only mildly elevated and close to her baseline, chest x-ray reveals stable  cardiomegaly no vascular congestion shallow inspiration. EKG as noted above. Exam reveals edematous LE and hands. No hypoxia. Echo in 2018 revealed There was severe concentric hypertrophy. Systolic function was mildly to moderately reduced. The estimated ejection fraction was in the range of 40% to 45%. Moderate diffuse hypokinesis. Provided with 40 mg of Lasix intravenously in the emergency department. Home medications include Coreg, Lasix, imdur, lisinopril -Admit to telemetry -Obtain daily weights -Monitor intake and output -Continue IV Lasix -Cycle troponin -Continue home meds -Monitor electrolytes  #2. Chest pain. Heart score 5.  Patient denies any chest pain or pressure. Initial troponin negative. EKG with no acute changes. Chest x-ray as noted above. She is provided with aspirin. She is pain-free at the time of admission. Home meds include imdur and Lipitor and low-dose aspirin -Cycle troponin -Serial EKG -Continue home meds -supportive therapy -gi cocktail  #3. Aspiration of liquid. Patient reports that she " strangles on liquid". She said facility in process of scheduling swallow eval -Nothing by mouth -Speech therapy for swallow eval  #4. Hypertension. Fair control in the emergency department -Monitor -Continue home meds as noted above  #5. History of COPD. Presents with sob.  Not on home oxygen. Chest x-ray as noted above. Oxygen saturation level greater than 90% on room air -Scheduled meds -oxygen supplementation as needed -monitor oxygen saturation level.  #5. Chronic kidney disease stage III. Creatinine 1.2 on admission. This appears to be close to her baseline -Monitor closely given need for iv lasix -Hold other nephrotoxins -Monitor urine output -Recheck in the morning  #6. Hypothyroidism. -Continue home meds   DVT prophylaxis: lovenox Code Status: full  Family Communication: none present  Disposition Plan: back to facility  Consults called: none  Admission  status: obs    Radene Gunning MD [facility]  If 7PM-7AM, please contact night-coverage [email or web address] Password [email or web address]  10/28/2017, 7:08 AM

## 2017-10-28 NOTE — ED Triage Notes (Signed)
Patient arrived with EMS from [facility] home staff reported increasing edema at hands and feet for several days with exertional dyspnea / productive cough . Denies fever or chills .

## 2017-10-28 NOTE — ED Notes (Signed)
Ordered breakfast tray  

## 2017-10-28 NOTE — Progress Notes (Signed)
SLP Cancellation Note  Patient Details Name: [name] MRN: [id number] DOB: November 01, 1933   Cancelled treatment:         Attempted to see pt for bedside swallow in ED; pt leaving for transfer to floor. Will reattempt as able.  Deneise Lever, [place], [place] Speech-Language Pathologist [place] 10/28/2017, 4:15 PM

## 2017-10-28 NOTE — ED Provider Notes (Signed)
TIME SEEN: 4:21 AM  CHIEF COMPLAINT: Chest pain, shortness of breath, lower extremity swelling  HPI: Patient is an 84 year old female who comes from a nursing facility with history of CHF, colon cancer, diabetes, hypertension, hypothyroidism, PE not currently on anticoagulation who presents emergency department with chest pain and shortness of breath.  States that the chest pain has been ongoing intermittently for the past week.  Described as a tightness and a fullness in the anterior chest without radiation.  Also feels short of breath and has had a cough without sputum production.  No fever.  Reports increasing swelling in bilateral lower extremities which is causing discomfort and now having swelling in both of her hands especially on the left side.  She states she is on Lasix 40 mg once daily but she does not feel this is helping.  She is still urinating.  Denies any changes in her diet.  ROS: See HPI Constitutional: no fever  Eyes: no drainage  ENT: no runny nose   Cardiovascular:   chest pain  Resp:  SOB  GI: no vomiting GU: no dysuria Integumentary: no rash  Allergy: no hives  Musculoskeletal: no leg swelling  Neurological: no slurred speech ROS otherwise negative  PAST MEDICAL HISTORY/PAST SURGICAL HISTORY:  Past Medical History:  Diagnosis Date  . Anginal pain ([place])   . Arthritis    "comes and goes" (03/01/2015)  . Asthma   . CHF (congestive heart failure) ([place])   . Colon cancer ([place])   . Colostomy care ([place])   . COPD (chronic obstructive pulmonary disease) ([place])   . Diabetes mellitus without complication ([place])    pt denies this hx on 03/01/2015  . Heart murmur   . History of blood transfusion 03/01/2015; 04/23/2017   "related to anemia,"  . Hyperlipidemia   . Hypertension   . Hypothyroidism   . Pneumonia    "just once" (03/01/2015)  . Pneumonia 04/23/2017  . Presence of combination internal cardiac defibrillator (ICD) and pacemaker    2005 in [facility]  . Pulmonary  embolism ([place])   . Renal disorder     MEDICATIONS:  Prior to Admission medications   Medication Sig Start Date End Date Taking? Authorizing Provider  acetaminophen (TYLENOL) 325 MG tablet Take 650 mg by mouth every 6 (six) hours as needed (pain).    [provider]  albuterol (PROVENTIL) (2.5 MG/3ML) 0.083% nebulizer solution Take 3 mLs (2.5 mg total) by nebulization every 4 (four) hours as needed for wheezing. 04/28/15   Geradine Girt, DO  aspirin EC 81 MG tablet Take 1 tablet (81 mg total) by mouth every morning. 03/03/15   Rai, Ripudeep K, MD  atorvastatin (LIPITOR) 20 MG tablet Take 20 mg by mouth daily.     [provider]  benzonatate (TESSALON) 100 MG capsule Take 1 capsule (100 mg total) by mouth 3 (three) times daily as needed for cough. 01/15/17   Horton, Barbette Hair, MD  carboxymethylcellulose (REFRESH) 1 % ophthalmic solution Place 1 drop into both eyes 3 (three) times daily.    [provider]  carvedilol (COREG) 3.125 MG tablet Take 1 tablet (3.125 mg total) by mouth 2 (two) times daily with a meal. 02/04/16   Bonnell Public, MD  Fluticasone-Salmeterol (ADVAIR) 500-50 MCG/DOSE AEPB Inhale 1 puff into the lungs 2 (two) times daily. Rinse mouth after use    [provider]  furosemide (LASIX) 40 MG tablet Take 1 tablet (40 mg total) by mouth daily. 12/30/15  Shirley Friar, PA-C  hydrALAZINE (APRESOLINE) 25 MG tablet Take 0.5 tablets (12.5 mg total) by mouth 3 (three) times daily. 12/23/15   Clegg, Amy D, NP  HYDROcodone-acetaminophen (NORCO/VICODIN) 5-325 MG tablet Take 1 tablet by mouth every 4 (four) hours as needed (pain). 05/04/17   Medina-Vargas, Monina C, NP  iron polysaccharides (FERREX 150) 150 MG capsule Take 150 mg by mouth 2 (two) times daily.    [provider]  isosorbide mononitrate (IMDUR) 30 MG 24 hr tablet Take 1 tablet (30 mg total) by mouth daily. 03/24/15   Dhungel, Nishant, MD  Lactobacillus (FLORANEX PO) Take 1  tablet by mouth 3 (three) times daily with meals.    [provider]  levothyroxine (SYNTHROID, LEVOTHROID) 50 MCG tablet Take 50 mcg by mouth daily.  01/16/15   [provider]  lisinopril (PRINIVIL,ZESTRIL) 10 MG tablet Take 1 tablet (10 mg total) by mouth daily. 04/29/17   Johnson, Clanford L, MD  loratadine (CLARITIN) 10 MG tablet Take 10 mg by mouth daily.    [provider]  oxymetazoline (AFRIN) 0.05 % nasal spray Place 1 spray into both nostrils 2 (two) times daily as needed (for nose bleeds). Reported on 12/23/2015    [provider]  potassium chloride (K-DUR) 10 MEQ tablet Take 1 tablet (10 mEq total) by mouth daily. 10/28/15   Noemi Chapel, MD  ranitidine (ZANTAC) 150 MG tablet Take 150 mg by mouth daily.  01/14/15   [provider]  tamsulosin (FLOMAX) 0.4 MG CAPS capsule Take 1 capsule (0.4 mg total) by mouth daily after supper. 04/29/17   Murlean Iba, MD    ALLERGIES:  No Known Allergies  SOCIAL HISTORY:  Social History   Tobacco Use  . Smoking status: Never Smoker  . Smokeless tobacco: Never Used  Substance Use Topics  . Alcohol use: No    FAMILY HISTORY: Family History  Problem Relation Age of Onset  . CAD Unknown   . Hypertension Unknown   . Stroke Unknown   . Colon cancer Neg Hx   . GI Bleed Neg Hx     EXAM: BP (!) 165/78 (BP Location: Right Arm)   Pulse 81   Temp 98.6 F (37 C) (Oral)   Resp 20   SpO2 100%  CONSTITUTIONAL: Alert and oriented and responds appropriately to questions.  Elderly HEAD: Normocephalic EYES: Conjunctivae clear, pupils appear equal, EOMI ENT: normal nose; moist mucous membranes NECK: Supple, no meningismus, no nuchal rigidity, no LAD  CARD: RRR; S1 and S2 appreciated; no murmurs, no clicks, no rubs, no gallops RESP: Normal chest excursion without splinting or tachypnea; breath sounds clear and equal bilaterally; no wheezes, no rhonchi, no rales, no hypoxia or respiratory distress,  speaking full sentences, diminished at bases bilaterally ABD/GI: Normal bowel sounds; non-distended; soft, non-tender, no rebound, no guarding, no peritoneal signs, no hepatosplenomegaly BACK:  The back appears normal and is non-tender to palpation, there is no CVA tenderness EXT: Normal ROM in all joints; non-tender to palpation; nonpitting edema noted to both lower extremities especially in the feet and as well as the hands worse on the left hand, 2+ radial and DP pulses bilaterally, no redness or warmth noted; normal capillary refill; no cyanosis, no calf tenderness or swelling    SKIN: Normal color for age and race; warm; no rash NEURO: Moves all extremities equally PSYCH: The patient's mood and manner are appropriate. Grooming and personal hygiene are appropriate.  MEDICAL DECISION MAKING: Patient here with increased  swelling, chest pain or shortness of breath.  Has significant cardiac history.  Will obtain cardiac labs, chest x-ray.  EKG shows no new ischemic changes.  Patient may need admission.  No respiratory distress or hypoxia currently.  She does appear volume overloaded.  ED PROGRESS: First troponin negative.  Chest x-ray shows cardiomegaly without vascular congestion but she does look short of breath with ambulation to the bathroom but no hypoxia.  Given complaints of chest pain with multiple risk factors will admit for chest pain rule out and IV diuresis.  Patient comfortable with this plan.  6:09 AM Discussed patient's case with hospitalist, Dr. Maudie Mercury.  I have recommended admission and patient (and family if present) agree with this plan. Admitting physician will place admission orders.   I reviewed all nursing notes, vitals, pertinent previous records, EKGs, lab and urine results, imaging (as available).     EKG Interpretation  Date/Time:  Sunday October 28 2017 04:11:35 EST Ventricular Rate:  79 PR Interval:    QRS Duration: 163 QT Interval:  465 QTC Calculation: 534 R  Axis:   102 Text Interpretation:  Sinus rhythm Prolonged PR interval Left atrial enlargement Left bundle branch block Lateral infarct, recent Probable anteroseptal infarct, recent Baseline wander in lead(s) V5 No significant change since last tracing Confirmed by Ward, Cyril Mourning [phone]) on 10/28/2017 4:22:26 AM         Ward, Delice Bison, DO 10/28/17 9833

## 2017-10-29 DIAGNOSIS — N183 Chronic kidney disease, stage 3 (moderate): Secondary | ICD-10-CM | POA: Diagnosis not present

## 2017-10-29 DIAGNOSIS — T17998D Other foreign object in respiratory tract, part unspecified causing other injury, subsequent encounter: Secondary | ICD-10-CM | POA: Diagnosis not present

## 2017-10-29 DIAGNOSIS — I5023 Acute on chronic systolic (congestive) heart failure: Secondary | ICD-10-CM | POA: Diagnosis not present

## 2017-10-29 DIAGNOSIS — R600 Localized edema: Secondary | ICD-10-CM | POA: Diagnosis not present

## 2017-10-29 DIAGNOSIS — N179 Acute kidney failure, unspecified: Secondary | ICD-10-CM | POA: Diagnosis not present

## 2017-10-29 DIAGNOSIS — I10 Essential (primary) hypertension: Secondary | ICD-10-CM | POA: Diagnosis not present

## 2017-10-29 LAB — CBC
HEMATOCRIT: 27.8 % — AB (ref 36.0–46.0)
Hemoglobin: 8.6 g/dL — ABNORMAL LOW (ref 12.0–15.0)
MCH: 27.7 pg (ref 26.0–34.0)
MCHC: 30.9 g/dL (ref 30.0–36.0)
MCV: 89.4 fL (ref 78.0–100.0)
Platelets: 213 10*3/uL (ref 150–400)
RBC: 3.11 MIL/uL — AB (ref 3.87–5.11)
RDW: 15.4 % (ref 11.5–15.5)
WBC: 5.1 10*3/uL (ref 4.0–10.5)

## 2017-10-29 LAB — BASIC METABOLIC PANEL
ANION GAP: 11 (ref 5–15)
BUN: 31 mg/dL — ABNORMAL HIGH (ref 6–20)
CO2: 24 mmol/L (ref 22–32)
Calcium: 9.2 mg/dL (ref 8.9–10.3)
Chloride: 102 mmol/L (ref 101–111)
Creatinine, Ser: 1.75 mg/dL — ABNORMAL HIGH (ref 0.44–1.00)
GFR, EST AFRICAN AMERICAN: 30 mL/min — AB (ref >60)
GFR, EST NON AFRICAN AMERICAN: 26 mL/min — AB (ref >60)
Glucose, Bld: 76 mg/dL (ref 65–99)
POTASSIUM: 4.2 mmol/L (ref 3.5–5.1)
Sodium: 137 mmol/L (ref 135–145)

## 2017-10-29 LAB — TROPONIN I

## 2017-10-29 MED ORDER — ENOXAPARIN SODIUM 30 MG/0.3ML ~~LOC~~ SOLN
30.0000 mg | Freq: Every day | SUBCUTANEOUS | Status: DC
  Administered 2017-10-29 – 2017-11-01 (×4): 30 mg via SUBCUTANEOUS
  Filled 2017-10-29 (×4): qty 0.3

## 2017-10-29 MED ORDER — FUROSEMIDE 40 MG PO TABS
60.0000 mg | ORAL_TABLET | Freq: Every day | ORAL | Status: DC
  Administered 2017-10-29 – 2017-10-30 (×2): 60 mg via ORAL
  Filled 2017-10-29 (×2): qty 1

## 2017-10-29 NOTE — Plan of Care (Signed)
  Progressing SLP Dysphagia Goals Patient will utilize recommended strategies Description Patient will utilize recommended strategies during swallow to increase swallowing safety with 10/29/2017 0927 - Progressing by Colon Flattery B, CCC-SLP

## 2017-10-29 NOTE — Plan of Care (Signed)
  Progressing Clinical Measurements: Ability to maintain clinical measurements within normal limits will improve 10/29/2017 0202 - Progressing by Ruben Im, RN Will remain free from infection 10/29/2017 0202 - Progressing by Ruben Im, RN Diagnostic test results will improve 10/29/2017 0202 - Progressing by Ruben Im, RN Respiratory complications will improve 10/29/2017 0202 - Progressing by Ruben Im, RN Cardiovascular complication will be avoided 10/29/2017 0202 - Progressing by Ruben Im, RN Activity: Risk for activity intolerance will decrease 10/29/2017 0202 - Progressing by Ruben Im, RN Nutrition: Adequate nutrition will be maintained 10/29/2017 0202 - Progressing by Ruben Im, RN Elimination: Will not experience complications related to bowel motility 10/29/2017 0202 - Progressing by Ruben Im, RN Will not experience complications related to urinary retention 10/29/2017 0202 - Progressing by Ruben Im, RN Safety: Ability to remain free from injury will improve 10/29/2017 0202 - Progressing by Ruben Im, RN Skin Integrity: Risk for impaired skin integrity will decrease 10/29/2017 0202 - Progressing by Ruben Im, RN Education: Ability to demonstrate management of disease process will improve 10/29/2017 0202 - Progressing by Ruben Im, RN Ability to verbalize understanding of medication therapies will improve 10/29/2017 0202 - Progressing by Ruben Im, RN Activity: Capacity to carry out activities will improve 10/29/2017 0202 - Progressing by Ruben Im, RN Cardiac: Ability to achieve and maintain adequate cardiopulmonary perfusion will improve 10/29/2017 0202 - Progressing by Ruben Im, RN

## 2017-10-29 NOTE — Progress Notes (Signed)
   10/29/17 1000  Clinical Encounter Type  Visited With Patient  Visit Type Initial  Referral From Nurse  Consult/Referral To Chaplain  Spiritual Encounters  Spiritual Needs Literature;Prayer   Following up on a [facility] for HCPA information.  Patient thought she already had one, her only son handles her paperwork.  I did review the HCPA and left the paperwork with her and let her know we are happy to assist anyway we can.  Prayed with her.  Will follow as needed. Chaplain Katherene Ponto

## 2017-10-29 NOTE — Evaluation (Addendum)
Clinical/Bedside Swallow Evaluation Patient Details  Name: [name] MRN: [id number] Date of Birth: 04/07/1934  Today's Date: 10/29/2017 Time: SLP Start Time (ACUTE ONLY): 0900 SLP Stop Time (ACUTE ONLY): 0930 SLP Time Calculation (min) (ACUTE ONLY): 30 min  Past Medical History:  Past Medical History:  Diagnosis Date  . Anginal pain ([place])   . Arthritis    "comes and goes" (03/01/2015)  . Asthma   . CHF (congestive heart failure) ([place])   . Colon cancer ([place])   . Colostomy care ([place])   . COPD (chronic obstructive pulmonary disease) ([place])   . Diabetes mellitus without complication ([place])    pt denies this hx on 03/01/2015  . Heart murmur   . History of blood transfusion 03/01/2015; 04/23/2017   "related to anemia,"  . Hyperlipidemia   . Hypertension   . Hypothyroidism   . Pneumonia    "just once" (03/01/2015)  . Pneumonia 04/23/2017  . Presence of combination internal cardiac defibrillator (ICD) and pacemaker    2005 in [facility]  . Pulmonary embolism ([place])   . Renal disorder    Past Surgical History:  Past Surgical History:  Procedure Laterality Date  . ABDOMINAL HYSTERECTOMY    . ANKLE FRACTURE SURGERY Left   . CARDIAC CATHETERIZATION N/A 03/22/2015   Procedure: Right/Left Heart Cath and Coronary Angiography;  Surgeon: Leonie Man, MD;  Location: [facility];  Service: Cardiovascular;  Laterality: N/A;  . CATARACT EXTRACTION W/ INTRAOCULAR LENS  IMPLANT, BILATERAL Bilateral   . CHOLECYSTECTOMY    . COLON SURGERY     cancer  . DILATION AND CURETTAGE OF UTERUS    . FRACTURE SURGERY    . TUBAL LIGATION     HPI:  84 year old female admitted 10/28/17 with SOB. PMH significant for mild dementia, COPD, CHF, PNA (2018), coughing with liquids. Lungs are clear, pt is afebrile. Right lung is clear per CXR.  Assessment / Plan / Recommendation Clinical Impression  Pt presents with adequate oral motor strength and function. Upper dentures in place. Pt reports awareness of  tendency to choke on liquids, however, she indicates using safe swallow techniques (small sips, slow rate), to mitigate aspiration risk. No overt s/s aspiration were observed with any po trial today. RN and pt report no difficulty with whole meds given with liquid. At this time, will begin soft chopped solids and thin liquids. SLP will follow acutely to assess diet tolerance and determine if objective study is warranted.     SLP Visit Diagnosis: Dysphagia, unspecified (R13.10)    Aspiration Risk  Mild aspiration risk    Diet Recommendation Dysphagia 3 (Mech soft);Thin liquid   Liquid Administration via: Cup;Straw Medication Administration: Whole meds with liquid(1 at a time) Supervision: Patient able to self feed;Intermittent supervision to cue for compensatory strategies Compensations: Minimize environmental distractions;Slow rate;Small sips/bites Postural Changes: Seated upright at 90 degrees;Remain upright for at least 30 minutes after po intake    Other  Recommendations Oral Care Recommendations: Oral care BID   Follow up Recommendations (TBD)      Frequency and Duration min 1 x/week  1 week;2 weeks       Prognosis Prognosis for Safe Diet Advancement: Good      Swallow Study   General Date of Onset: 10/28/17 HPI: 84 year old female admitted 10/28/17 with SOB. PMH significant for mild dementia, COPD, CHF, PNA (2018), coughing with liquids Type of Study: Bedside Swallow Evaluation Previous Swallow Assessment: BSE July 2018 - suspect  primary esophageal dysphagia. Rec reg/thin Diet Prior to this Study: NPO Temperature Spikes Noted: No Respiratory Status: Room air History of Recent Intubation: No Behavior/Cognition: Alert;Cooperative;Pleasant mood Oral Cavity Assessment: Within Functional Limits Oral Care Completed by SLP: No Oral Cavity - Dentition: Dentures, top Vision: Functional for self-feeding Self-Feeding Abilities: Able to feed self Patient Positioning: Upright in  bed Baseline Vocal Quality: Normal Volitional Cough: Strong Volitional Swallow: Able to elicit    Oral/Motor/Sensory Function Overall Oral Motor/Sensory Function: Within functional limits   Ice Chips Ice chips: Not tested   Thin Liquid Thin Liquid: Within functional limits Presentation: Cup;Self Fed;Straw    Nectar Thick Nectar Thick Liquid: Not tested   Honey Thick Honey Thick Liquid: Not tested   Puree Puree: Within functional limits Presentation: Spoon;Self Fed   Solid   GO   Solid: Within functional limits Presentation: [place] B. Quentin Ore, [facility], [place] Speech Language Pathologist [phone]  Shonna Chock 10/29/2017,9:30 AM

## 2017-10-29 NOTE — Progress Notes (Signed)
PROGRESS NOTE    [name]  EUM:[id number] DOB: 04/26/1934 DOA: 10/28/2017 PCP: [name], PA   Outpatient Specialists:     Brief Narrative:  [name] is a delightful 84 y.o. female with medical history significant for a PDA not on home oxygen, PE not on anticoagulation, dyslipidemia, hypothyroidism, mild dementia, CAD status post ICD/pacemaker, colon cancer status post colostomy, anemia, combined systolic diastolic heart failure presents to the emergency department with complaints of shortness of breath, coughing and tightness in her chest. Initial evaluation reassuring for ACS but does reveal signs of volume overload. [facility] are asked to admit for chest pain rule out  Information is obtained from the chart and the patient noting that information from patient may be unreliable due to mild dementia. She states over the last week she has gotten "more and more swollen". She reports her feet and legs feel like "pins and needles" are in them. Associated symptoms include frequent cough especially with "drinking liquid" and weight gain. She states "I weight 208 never weighed that my life". He states she could hear herself wheezing. She states she gets "strangled" when drinking but not when eating food. She states then that "fluid backs up in my throat makes me cough". She denies chest pain pressure diaphoresis nausea vomiting. She denies orthopnea headache dizziness syncope or near-syncope. She denies diarrhea constipation dysuria hematuria frequency or urgency.     Assessment & Plan:   Principal Problem:   Acute on chronic systolic CHF (congestive heart failure) (HCC) Active Problems:   COPD (chronic obstructive pulmonary disease) (HCC)   CKD (chronic kidney disease) stage 3, GFR 30-59 ml/min (HCC)   Benign essential HTN   NICM (nonischemic cardiomyopathy) (HCC)   Hypothyroidism   Chest pain   Aspiration of liquid   Acute on chronic systolic heart failure.  -given  IV lasix with minimal diuresis -CMP- to see albumin-- appears swollen all over -change back to PO lasix  Chest pain. Heart score 5.  Patient denies any chest pain or pressure. Initial troponin negative. EKG with no acute changes. Chest x-ray as noted above. She is provided with aspirin. She is pain-free at the time of admission. Home meds include imdur and Lipitor and low-dose aspirin -CE negative  Aspiration of liquid. Patient reports that she " strangles on liquid". -SLP: DYS 3 with think liquids Aspiration precautions  Hypertension. Fair control in the emergency department -Monitor -Continue home meds as noted above   History of COPD. Presents with sob.  Not on home oxygen. Chest x-ray as noted above. Oxygen saturation level greater than 90% on room air -Scheduled meds -oxygen supplementation as needed -monitor oxygen saturation level.  Chronic kidney disease stage III. Creatinine 1.2 on admission. -increased to 1.7 with IV lasix  Hypothyroidism. -Continue home meds     DVT prophylaxis:  Lovenox   Code Status: Full Code   Family Communication:   Disposition Plan:     Consultants:    Subjective: Says she sometimes chokes on liquids No worsening of her breathing  Objective: Vitals:   10/29/17 0500 10/29/17 0853 10/29/17 1300 10/29/17 1411  BP: (!) 127/50 (!) 128/47  (!) 96/49  Pulse: 72 70  81  Resp: 20 20 20 20   Temp: 98.2 F (36.8 C) 98.7 F (37.1 C)  98.7 F (37.1 C)  TempSrc: Oral Oral  Oral  SpO2: 99% 98%  100%  Weight:      Height:        Intake/Output Summary (  Last 24 hours) at 10/29/2017 1454 Last data filed at 10/29/2017 1300 Gross per 24 hour  Intake 0 ml  Output 450 ml  Net -450 ml   Filed Weights   10/28/17 1629 10/29/17 0313  Weight: 91 kg (200 lb 9.6 oz) 90.4 kg (199 lb 4.8 oz)    Examination:  General exam: Appears calm and comfortable - NAD Respiratory system: Clear to auscultation. Respiratory effort normal. No  increase work of breathing Cardiovascular system: S1 & S2 heard, RRR. No JVD, murmurs, rubs, gallops or clicks. Gastrointestinal system: Abdomen is nondistended, soft and nontender. No organomegaly or masses felt. Normal bowel sounds heard. Central nervous system: Alert and oriented. No focal neurological deficits. Skin: No rashes, lesions or ulcers, +LE edema Psychiatry: Judgement and insight appear normal. Mood & affect appropriate.     Data Reviewed: I have personally reviewed following labs and imaging studies  CBC: Recent Labs  Lab 10/28/17 0445 10/29/17 0214  WBC 4.6 5.1  NEUTROABS 2.4  --   HGB 9.4* 8.6*  HCT 30.5* 27.8*  MCV 88.9 89.4  PLT 231 710   Basic Metabolic Panel: Recent Labs  Lab 10/28/17 0445 10/29/17 0214  NA 139 137  K 4.3 4.2  CL 105 102  CO2 23 24  GLUCOSE 79 76  BUN 26* 31*  CREATININE 1.21* 1.75*  CALCIUM 9.5 9.2   GFR: Estimated Creatinine Clearance: 28.5 mL/min (A) (by C-G formula based on SCr of 1.75 mg/dL (H)). Liver Function Tests: No results for input(s): AST, ALT, ALKPHOS, BILITOT, PROT, ALBUMIN in the last 168 hours. No results for input(s): LIPASE, AMYLASE in the last 168 hours. No results for input(s): AMMONIA in the last 168 hours. Coagulation Profile: No results for input(s): INR, PROTIME in the last 168 hours. Cardiac Enzymes: Recent Labs  Lab 10/28/17 0613 10/28/17 2029 10/29/17 0214  TROPONINI <0.03 <0.03 <0.03   BNP (last 3 results) No results for input(s): PROBNP in the last 8760 hours. HbA1C: No results for input(s): HGBA1C in the last 72 hours. CBG: No results for input(s): GLUCAP in the last 168 hours. Lipid Profile: Recent Labs    10/28/17 1542  CHOL 117  HDL 46  LDLCALC 63  TRIG 41  CHOLHDL 2.5   Thyroid Function Tests: No results for input(s): TSH, T4TOTAL, FREET4, T3FREE, THYROIDAB in the last 72 hours. Anemia Panel: No results for input(s): VITAMINB12, FOLATE, FERRITIN, TIBC, IRON, RETICCTPCT in  the last 72 hours. Urine analysis:    Component Value Date/Time   COLORURINE YELLOW 10/28/2017 [place] 10/28/2017 0539   LABSPEC 1.010 10/28/2017 0539   PHURINE 7.0 10/28/2017 0539   GLUCOSEU NEGATIVE 10/28/2017 0539   HGBUR NEGATIVE 10/28/2017 0539   BILIRUBINUR NEGATIVE 10/28/2017 0539   KETONESUR NEGATIVE 10/28/2017 0539   PROTEINUR NEGATIVE 10/28/2017 0539   UROBILINOGEN 0.2 07/23/2015 1610   NITRITE NEGATIVE 10/28/2017 0539   LEUKOCYTESUR NEGATIVE 10/28/2017 0539     ) Recent Results (from the past 240 hour(s))  MRSA PCR Screening     Status: None   Collection Time: 10/28/17  4:42 PM  Result Value Ref Range Status   MRSA by PCR NEGATIVE NEGATIVE Final    Comment:        The GeneXpert MRSA Assay (FDA approved for NASAL specimens only), is one component of a comprehensive MRSA colonization surveillance program. It is not intended to diagnose MRSA infection nor to guide or monitor treatment for MRSA infections.       Anti-infectives (  From admission, onward)   None       Radiology Studies: Dg Chest 2 View  Result Date: 10/28/2017 CLINICAL DATA:  84 year old female with shortness of breath. EXAM: CHEST  2 VIEW COMPARISON:  Chest radiograph dated 04/28/2017 FINDINGS: There is shallow inspiration. An area of increased density at the left lung base similar to prior radiograph likely combination of cardiomegaly and atelectatic changes. Underlying pneumonia is not excluded. Clinical correlation is recommended. The right lung is clear. No pneumothorax. Left pectoral AICD device. No acute osseous pathology. IMPRESSION: 1. Shallow inspiration. Left lung base. Infiltrate is not excluded. Clinical correlation is recommended. 2. Stable cardiomegaly.  No vascular congestion. Electronically Signed   By: Anner Crete M.D.   On: 10/28/2017 04:56        Scheduled Meds: . aspirin EC  325 mg Oral Daily  . atorvastatin  20 mg Oral Daily  . carvedilol  6.25  mg Oral BID WC  . enoxaparin (LOVENOX) injection  30 mg Subcutaneous Daily  . furosemide  60 mg Oral Daily  . hydrALAZINE  12.5 mg Oral TID  . iron polysaccharides  150 mg Oral BID  . isosorbide mononitrate  30 mg Oral Daily  . levothyroxine  50 mcg Oral QAC breakfast  . lisinopril  20 mg Oral Daily  . loratadine  10 mg Oral Daily  . mometasone-formoterol  2 puff Inhalation BID  . pantoprazole  40 mg Oral Daily  . polyvinyl alcohol  1 drop Both Eyes TID  . potassium chloride  10 mEq Oral Daily  . tamsulosin  0.4 mg Oral QPC supper   Continuous Infusions:   LOS: 0 days    Time spent: 35 min    Geradine Girt, DO [facility] Pager [phone]  If 7PM-7AM, please contact night-coverage [email or web address] Password [facility] 10/29/2017, 2:54 PM

## 2017-10-30 ENCOUNTER — Observation Stay (HOSPITAL_COMMUNITY): Payer: Medicare HMO

## 2017-10-30 DIAGNOSIS — R600 Localized edema: Secondary | ICD-10-CM | POA: Diagnosis not present

## 2017-10-30 DIAGNOSIS — Z85038 Personal history of other malignant neoplasm of large intestine: Secondary | ICD-10-CM | POA: Diagnosis not present

## 2017-10-30 DIAGNOSIS — J439 Emphysema, unspecified: Secondary | ICD-10-CM | POA: Diagnosis not present

## 2017-10-30 DIAGNOSIS — I5023 Acute on chronic systolic (congestive) heart failure: Secondary | ICD-10-CM | POA: Diagnosis present

## 2017-10-30 DIAGNOSIS — E039 Hypothyroidism, unspecified: Secondary | ICD-10-CM | POA: Diagnosis present

## 2017-10-30 DIAGNOSIS — N179 Acute kidney failure, unspecified: Secondary | ICD-10-CM | POA: Diagnosis not present

## 2017-10-30 DIAGNOSIS — Z95810 Presence of automatic (implantable) cardiac defibrillator: Secondary | ICD-10-CM | POA: Diagnosis not present

## 2017-10-30 DIAGNOSIS — Z9049 Acquired absence of other specified parts of digestive tract: Secondary | ICD-10-CM | POA: Diagnosis not present

## 2017-10-30 DIAGNOSIS — Z7951 Long term (current) use of inhaled steroids: Secondary | ICD-10-CM | POA: Diagnosis not present

## 2017-10-30 DIAGNOSIS — D649 Anemia, unspecified: Secondary | ICD-10-CM | POA: Diagnosis present

## 2017-10-30 DIAGNOSIS — I130 Hypertensive heart and chronic kidney disease with heart failure and stage 1 through stage 4 chronic kidney disease, or unspecified chronic kidney disease: Secondary | ICD-10-CM | POA: Diagnosis present

## 2017-10-30 DIAGNOSIS — Z9841 Cataract extraction status, right eye: Secondary | ICD-10-CM | POA: Diagnosis not present

## 2017-10-30 DIAGNOSIS — Z7982 Long term (current) use of aspirin: Secondary | ICD-10-CM | POA: Diagnosis not present

## 2017-10-30 DIAGNOSIS — R1310 Dysphagia, unspecified: Secondary | ICD-10-CM | POA: Diagnosis not present

## 2017-10-30 DIAGNOSIS — Z90710 Acquired absence of both cervix and uterus: Secondary | ICD-10-CM | POA: Diagnosis not present

## 2017-10-30 DIAGNOSIS — R0789 Other chest pain: Secondary | ICD-10-CM | POA: Diagnosis present

## 2017-10-30 DIAGNOSIS — E785 Hyperlipidemia, unspecified: Secondary | ICD-10-CM | POA: Diagnosis present

## 2017-10-30 DIAGNOSIS — R011 Cardiac murmur, unspecified: Secondary | ICD-10-CM | POA: Diagnosis present

## 2017-10-30 DIAGNOSIS — I428 Other cardiomyopathies: Secondary | ICD-10-CM | POA: Diagnosis present

## 2017-10-30 DIAGNOSIS — N183 Chronic kidney disease, stage 3 (moderate): Secondary | ICD-10-CM | POA: Diagnosis present

## 2017-10-30 DIAGNOSIS — E038 Other specified hypothyroidism: Secondary | ICD-10-CM | POA: Diagnosis not present

## 2017-10-30 DIAGNOSIS — Z86711 Personal history of pulmonary embolism: Secondary | ICD-10-CM | POA: Diagnosis not present

## 2017-10-30 DIAGNOSIS — J449 Chronic obstructive pulmonary disease, unspecified: Secondary | ICD-10-CM | POA: Diagnosis present

## 2017-10-30 DIAGNOSIS — Z9842 Cataract extraction status, left eye: Secondary | ICD-10-CM | POA: Diagnosis not present

## 2017-10-30 DIAGNOSIS — E1122 Type 2 diabetes mellitus with diabetic chronic kidney disease: Secondary | ICD-10-CM | POA: Diagnosis present

## 2017-10-30 DIAGNOSIS — M1990 Unspecified osteoarthritis, unspecified site: Secondary | ICD-10-CM | POA: Diagnosis present

## 2017-10-30 DIAGNOSIS — T17998D Other foreign object in respiratory tract, part unspecified causing other injury, subsequent encounter: Secondary | ICD-10-CM | POA: Diagnosis not present

## 2017-10-30 DIAGNOSIS — Z933 Colostomy status: Secondary | ICD-10-CM | POA: Diagnosis not present

## 2017-10-30 DIAGNOSIS — I10 Essential (primary) hypertension: Secondary | ICD-10-CM | POA: Diagnosis not present

## 2017-10-30 DIAGNOSIS — Z79890 Hormone replacement therapy (postmenopausal): Secondary | ICD-10-CM | POA: Diagnosis not present

## 2017-10-30 DIAGNOSIS — Z961 Presence of intraocular lens: Secondary | ICD-10-CM | POA: Diagnosis present

## 2017-10-30 LAB — CBC
HCT: 29.5 % — ABNORMAL LOW (ref 36.0–46.0)
Hemoglobin: 9.2 g/dL — ABNORMAL LOW (ref 12.0–15.0)
MCH: 28.1 pg (ref 26.0–34.0)
MCHC: 31.2 g/dL (ref 30.0–36.0)
MCV: 90.2 fL (ref 78.0–100.0)
PLATELETS: 228 10*3/uL (ref 150–400)
RBC: 3.27 MIL/uL — AB (ref 3.87–5.11)
RDW: 15.8 % — ABNORMAL HIGH (ref 11.5–15.5)
WBC: 5.2 10*3/uL (ref 4.0–10.5)

## 2017-10-30 LAB — COMPREHENSIVE METABOLIC PANEL
ALK PHOS: 73 U/L (ref 38–126)
ALT: 11 U/L — ABNORMAL LOW (ref 14–54)
AST: 18 U/L (ref 15–41)
Albumin: 3.5 g/dL (ref 3.5–5.0)
Anion gap: 13 (ref 5–15)
BUN: 50 mg/dL — ABNORMAL HIGH (ref 6–20)
CO2: 23 mmol/L (ref 22–32)
CREATININE: 2.39 mg/dL — AB (ref 0.44–1.00)
Calcium: 9.1 mg/dL (ref 8.9–10.3)
Chloride: 104 mmol/L (ref 101–111)
GFR calc non Af Amer: 18 mL/min — ABNORMAL LOW (ref >60)
GFR, EST AFRICAN AMERICAN: 21 mL/min — AB (ref >60)
Glucose, Bld: 82 mg/dL (ref 65–99)
Potassium: 4.6 mmol/L (ref 3.5–5.1)
SODIUM: 140 mmol/L (ref 135–145)
Total Bilirubin: 0.5 mg/dL (ref 0.3–1.2)
Total Protein: 6.6 g/dL (ref 6.5–8.1)

## 2017-10-30 MED ORDER — SODIUM CHLORIDE 0.9 % IV SOLN
INTRAVENOUS | Status: AC
  Administered 2017-10-30: 13:00:00 via INTRAVENOUS

## 2017-10-30 MED ORDER — GUAIFENESIN ER 600 MG PO TB12
600.0000 mg | ORAL_TABLET | Freq: Two times a day (BID) | ORAL | Status: DC
  Administered 2017-10-30 – 2017-11-02 (×7): 600 mg via ORAL
  Filled 2017-10-30 (×7): qty 1

## 2017-10-30 NOTE — Evaluation (Signed)
Physical Therapy Evaluation Patient Details Name: [name] MRN: [id number] DOB: January 03, 1934 Today's Date: 10/30/2017   History of Present Illness  Pt is a 84 y.o. female admitted from SNF on 10/28/17 for CHF exacerbation. PMH includes mild dementia, CAD (s/p ICD/pacemaker), HF, colon CA s/p colostomy, CKD III, COPD, HTN. Of note, admitted 6 months ago for worsening dyspnea and potential PNA.    Clinical Impression  Pt presents with an overall decrease in functional mobility secondary to above. PTA, pt reports receiving rehab at [facility] where she ambulated mod indep with rollator. Today, required min-modA to amb; pt declined further mobility secondary to c/o chronic bilat knee pain. Pt would benefit from continued acute PT services to maximize functional mobility and independence prior to d/c with continued SNF-level therapies.     Follow Up Recommendations SNF    Equipment Recommendations  None recommended by PT    Recommendations for Other Services       Precautions / Restrictions Precautions Precautions: Fall Restrictions Weight Bearing Restrictions: No      Mobility  Bed Mobility Overal bed mobility: Needs Assistance Bed Mobility: Supine to Sit     Supine to sit: Mod assist;HOB elevated     General bed mobility comments: ModA for UE support to assist trunk elevation; pt also reliant on use of bed rails. Increased time and effort secondary to c/o BLE pain and swelling  Transfers Overall transfer level: Needs assistance Equipment used: 1 person hand held assist Transfers: Sit to/from Stand Sit to Stand: Mod assist         General transfer comment: ModA for UE support to assist trunk elevation and maintain balance when standing  Ambulation/Gait Ambulation/Gait assistance: Min assist Ambulation Distance (Feet): 5 Feet Assistive device: 1 person hand held assist Gait Pattern/deviations: Step-to pattern;Antalgic Gait velocity: Decreased Gait velocity interpretation:  <1.8 ft/sec, indicative of risk for recurrent falls General Gait Details: Slow, antalgic gait from bed to chair with single UE support. Increased time and effort secondary to c/o chronic bilat knee pain; pt declining further mobility due to this pain  Stairs            Wheelchair Mobility    Modified Rankin (Stroke Patients Only)       Balance Overall balance assessment: Needs assistance   Sitting balance-Leahy Scale: Fair       Standing balance-Leahy Scale: Poor Standing balance comment: Reliant on UE support                             Pertinent Vitals/Pain Pain Assessment: Faces Faces Pain Scale: Hurts little more Pain Location: Bilat knees (R>L) Pain Descriptors / Indicators: Aching;Constant Pain Intervention(s): Monitored during session;Repositioned    Home Living Family/patient expects to be discharged to:: Skilled nursing facility                      Prior Function Level of Independence: Needs assistance   Gait / Transfers Assistance Needed: Amb with rollator  ADL's / Homemaking Assistance Needed: Meals provided at facility. Pt reports mod indep with ADLs, only requiring assist when donning/doffing ted hose        Hand Dominance        Extremity/Trunk Assessment   Upper Extremity Assessment Upper Extremity Assessment: Generalized weakness    Lower Extremity Assessment Lower Extremity Assessment: Generalized weakness    Cervical / Trunk Assessment Cervical / Trunk Assessment: Kyphotic  Communication   Communication:  No difficulties  Cognition Arousal/Alertness: Awake/alert Behavior During Therapy: WFL for tasks assessed/performed Overall Cognitive Status: History of cognitive impairments - at baseline                                 General Comments: A&Ox4. Per chart, h/o mild dementia      General Comments      Exercises     Assessment/Plan    PT Assessment Patient needs continued PT services   PT Problem List Decreased strength;Decreased activity tolerance;Decreased balance;Decreased mobility;Pain       PT Treatment Interventions DME instruction;Gait training;Functional mobility training;Therapeutic activities;Therapeutic exercise;Balance training;Patient/family education    PT Goals (Current goals can be found in the Care Plan section)  Acute Rehab PT Goals Patient Stated Goal: Continue rehab at SNF PT Goal Formulation: With patient Time For Goal Achievement: 11/13/17 Potential to Achieve Goals: Good    Frequency Min 2X/week   Barriers to discharge        Co-evaluation               AM-PAC PT "6 Clicks" Daily Activity  Outcome Measure Difficulty turning over in bed (including adjusting bedclothes, sheets and blankets)?: Unable Difficulty moving from lying on back to sitting on the side of the bed? : Unable Difficulty sitting down on and standing up from a chair with arms (e.g., wheelchair, bedside commode, etc,.)?: Unable Help needed moving to and from a bed to chair (including a wheelchair)?: A Little Help needed walking in hospital room?: A Little Help needed climbing 3-5 steps with a railing? : A Lot 6 Click Score: 11    End of Session Equipment Utilized During Treatment: Gait belt Activity Tolerance: Patient limited by pain Patient left: in chair;with call bell/phone within reach Nurse Communication: Mobility status;Other (comment)(No need for purewick; can amb to [facility] or bathroom) PT Visit Diagnosis: Other abnormalities of gait and mobility (R26.89)    Time: 4599-7741 PT Time Calculation (min) (ACUTE ONLY): 19 min   Charges:   PT Evaluation $PT Eval Moderate Complexity: 1 Mod     PT G Codes:       Mabeline Caras, PT, DPT Acute Rehab Services  Pager: [place] 10/30/2017, 10:45 AM

## 2017-10-30 NOTE — Progress Notes (Addendum)
Pt bladder scanned. Bladder scan= 164 mL. MD notified.

## 2017-10-30 NOTE — Clinical Social Work Note (Addendum)
CSW spoke with ALF staff. They stated that patient's son is not legal guardian but only HCPOA.  Dayton Scrape, CSW [phone]  4:14 pm Patient does not want SNF placement. She stated her knee pain is caused by arthritis which she always has.  Dayton Scrape, [place]

## 2017-10-30 NOTE — Progress Notes (Signed)
PROGRESS NOTE    [name]  NTZ:[id number] DOB: 17-Apr-1934 DOA: 10/28/2017 PCP: [name], PA   Outpatient Specialists:     Brief Narrative:  [name] is a delightful 84 y.o. female with medical history significant for a PDA not on home oxygen, PE not on anticoagulation, dyslipidemia, hypothyroidism, mild dementia, CAD status post ICD/pacemaker, colon cancer status post colostomy, anemia, combined systolic diastolic heart failure presents to the emergency department with complaints of shortness of breath, coughing and tightness in her chest. Initial evaluation reassuring for ACS but does reveal signs of volume overload. [facility] are asked to admit for chest pain rule out  Information is obtained from the chart and the patient noting that information from patient may be unreliable due to mild dementia. She states over the last week she has gotten "more and more swollen". She reports her feet and legs feel like "pins and needles" are in them. Associated symptoms include frequent cough especially with "drinking liquid" and weight gain. She states "I weight 208 never weighed that my life". He states she could hear herself wheezing. She states she gets "strangled" when drinking but not when eating food. She states then that "fluid backs up in my throat makes me cough". She denies chest pain pressure diaphoresis nausea vomiting. She denies orthopnea headache dizziness syncope or near-syncope. She denies diarrhea constipation dysuria hematuria frequency or urgency.     Assessment & Plan:   Principal Problem:   Acute on chronic systolic CHF (congestive heart failure) (HCC) Active Problems:   COPD (chronic obstructive pulmonary disease) (HCC)   CKD (chronic kidney disease) stage 3, GFR 30-59 ml/min (HCC)   Benign essential HTN   NICM (nonischemic cardiomyopathy) (HCC)   Hypothyroidism   Chest pain   Aspiration of liquid   Acute on chronic systolic heart failure.  -given  too much IV lasix and now appears dry -gentle IVF and restart lasix once Cr stable  Chest pain.  -CE negative -no further chest pain  Aspiration of liquid. Patient reports that she " strangles on liquid". -SLP: DYS 3 with think liquids Aspiration precautions -PPI  Hypertension.  -Monitor -Continue home meds as noted above   History of COPD. Presents with sob.  Not on home oxygen. Chest x-ray as noted above. Oxygen saturation level greater than 90% on room air -Scheduled meds -mucinex  AKI on Chronic kidney disease stage III. Creatinine 1.2 on admission. -patient was over diuresed in the ER and Cr rose to 2.39 -gentle IVF overnight  Hypothyroidism. -Continue home meds     DVT prophylaxis:  Lovenox   Code Status: Full Code   Family Communication:   Disposition Plan:  From ALF--- ? Needs SNF   Consultants:    Subjective: Says she is coughing up "lots" of mucous  Objective: Vitals:   10/30/17 0519 10/30/17 0809 10/30/17 0816 10/30/17 1242  BP: 114/65 (!) 129/52  (!) 120/47  Pulse: 85 78  79  Resp: 18   18  Temp: 98.4 F (36.9 C)   97.8 F (36.6 C)  TempSrc: Oral   Oral  SpO2: 98%  97% 97%  Weight: 89.4 kg (197 lb 1.5 oz)     Height:        Intake/Output Summary (Last 24 hours) at 10/30/2017 1505 Last data filed at 10/30/2017 1257 Gross per 24 hour  Intake 480 ml  Output 700 ml  Net -220 ml   Filed Weights   10/28/17 1629 10/29/17 0313 10/30/17 6759  Weight: 91 kg (200 lb 9.6 oz) 90.4 kg (199 lb 4.8 oz) 89.4 kg (197 lb 1.5 oz)    Examination:  General exam: in bed, hunched over Respiratory system: no increase work of breathing, no wheezing Cardiovascular system: rrr Gastrointestinal system: +BS ,soft Central nervous system: alert Skin:min LE edema Psychiatry: plesant    Data Reviewed: I have personally reviewed following labs and imaging studies  CBC: Recent Labs  Lab 10/28/17 0445 10/29/17 0214 10/30/17 0502  WBC 4.6  5.1 5.2  NEUTROABS 2.4  --   --   HGB 9.4* 8.6* 9.2*  HCT 30.5* 27.8* 29.5*  MCV 88.9 89.4 90.2  PLT 231 213 425   Basic Metabolic Panel: Recent Labs  Lab 10/28/17 0445 10/29/17 0214 10/30/17 0502  NA 139 137 140  K 4.3 4.2 4.6  CL 105 102 104  CO2 23 24 23   GLUCOSE 79 76 82  BUN 26* 31* 50*  CREATININE 1.21* 1.75* 2.39*  CALCIUM 9.5 9.2 9.1   GFR: Estimated Creatinine Clearance: 20.8 mL/min (A) (by C-G formula based on SCr of 2.39 mg/dL (H)). Liver Function Tests: Recent Labs  Lab 10/30/17 0502  AST 18  ALT 11*  ALKPHOS 73  BILITOT 0.5  PROT 6.6  ALBUMIN 3.5   No results for input(s): LIPASE, AMYLASE in the last 168 hours. No results for input(s): AMMONIA in the last 168 hours. Coagulation Profile: No results for input(s): INR, PROTIME in the last 168 hours. Cardiac Enzymes: Recent Labs  Lab 10/28/17 0613 10/28/17 2029 10/29/17 0214  TROPONINI <0.03 <0.03 <0.03   BNP (last 3 results) No results for input(s): PROBNP in the last 8760 hours. HbA1C: No results for input(s): HGBA1C in the last 72 hours. CBG: No results for input(s): GLUCAP in the last 168 hours. Lipid Profile: Recent Labs    10/28/17 1542  CHOL 117  HDL 46  LDLCALC 63  TRIG 41  CHOLHDL 2.5   Thyroid Function Tests: No results for input(s): TSH, T4TOTAL, FREET4, T3FREE, THYROIDAB in the last 72 hours. Anemia Panel: No results for input(s): VITAMINB12, FOLATE, FERRITIN, TIBC, IRON, RETICCTPCT in the last 72 hours. Urine analysis:    Component Value Date/Time   COLORURINE YELLOW 10/28/2017 [place] 10/28/2017 0539   LABSPEC 1.010 10/28/2017 0539   PHURINE 7.0 10/28/2017 0539   GLUCOSEU NEGATIVE 10/28/2017 0539   HGBUR NEGATIVE 10/28/2017 0539   BILIRUBINUR NEGATIVE 10/28/2017 0539   KETONESUR NEGATIVE 10/28/2017 0539   PROTEINUR NEGATIVE 10/28/2017 0539   UROBILINOGEN 0.2 07/23/2015 1610   NITRITE NEGATIVE 10/28/2017 0539   LEUKOCYTESUR NEGATIVE 10/28/2017  0539     ) Recent Results (from the past 240 hour(s))  MRSA PCR Screening     Status: None   Collection Time: 10/28/17  4:42 PM  Result Value Ref Range Status   MRSA by PCR NEGATIVE NEGATIVE Final    Comment:        The GeneXpert MRSA Assay (FDA approved for NASAL specimens only), is one component of a comprehensive MRSA colonization surveillance program. It is not intended to diagnose MRSA infection nor to guide or monitor treatment for MRSA infections.       Anti-infectives (From admission, onward)   None       Radiology Studies: Dg Chest 2 View  Result Date: 10/30/2017 CLINICAL DATA:  Cough EXAM: CHEST  2 VIEW COMPARISON:  10/28/2017 FINDINGS: Cardiac shadow is stable. Defibrillator is again noted and stable. Poor inspiratory effort is noted. The right  lung remains clear. Persistent infiltrative changes in the left base are noted. Degenerative changes of the thoracic spine are noted. IMPRESSION: Mild left basilar infiltrate stable from the prior exam Electronically Signed   By: Inez Catalina M.D.   On: 10/30/2017 14:32        Scheduled Meds: . aspirin EC  325 mg Oral Daily  . atorvastatin  20 mg Oral Daily  . carvedilol  6.25 mg Oral BID WC  . enoxaparin (LOVENOX) injection  30 mg Subcutaneous Daily  . guaiFENesin  600 mg Oral BID  . hydrALAZINE  12.5 mg Oral TID  . iron polysaccharides  150 mg Oral BID  . isosorbide mononitrate  30 mg Oral Daily  . levothyroxine  50 mcg Oral QAC breakfast  . loratadine  10 mg Oral Daily  . mometasone-formoterol  2 puff Inhalation BID  . pantoprazole  40 mg Oral Daily  . polyvinyl alcohol  1 drop Both Eyes TID  . potassium chloride  10 mEq Oral Daily  . tamsulosin  0.4 mg Oral QPC supper   Continuous Infusions: . sodium chloride 50 mL/hr at 10/30/17 1241     LOS: 0 days    Time spent: 35 min    Geradine Girt, DO [facility] Pager [phone]  If 7PM-7AM, please contact  night-coverage [email or web address] Password [facility] 10/30/2017, 3:05 PM

## 2017-10-30 NOTE — Clinical Social Work Note (Signed)
Clinical Social Work Assessment  Patient Details  Name: [name] MRN: [id number] Date of Birth: 1934-03-22  Date of referral:  10/30/17               Reason for consult:  Facility Placement, Discharge Planning                Permission sought to share information with:  Facility Sport and exercise psychologist, Family Supports Permission granted to share information::  Yes, Verbal Permission Granted  Name::     [name]  Agency::  [facility]  Relationship::  Son  Contact Information:  [phone]  Housing/Transportation Living arrangements for the past 2 months:  [place] of Information:  Patient, Medical Team Patient Interpreter Needed:  None Criminal Activity/Legal Involvement Pertinent to Current Situation/Hospitalization:  No - Comment as needed Significant Relationships:  Adult Children Lives with:  Facility Resident Do you feel safe going back to the place where you live?  Yes Need for family participation in patient care:  Yes (Comment)  Care giving concerns:  Patient is a resident at [facility]. PT is recommending SNF once medically stable for discharge.   Social Worker assessment / plan:  CSW met with patient. No supports at bedside. CSW introduced role and explained that discharge planning would be discussed. Patient confirmed she is from [place] and plans to return once stable for discharge. She gave CSW permission to contact her son. According to previous documentation, patient's son is her legal guardian. Patient was unable to confirm this, stating "he helps me." CSW unable to locate paperwork regarding this. CSW left him a voicemail. CSW saw that PT is recommending SNF after assessment. Will discuss with son when he calls back. No further concerns. CSW encouraged patient to contact CSW as needed. CSW will continue to follow patient and her son for support and facilitate discharge to SNF vs. ALF once medically  stable.  Employment status:  Retired Nurse, adult PT Recommendations:  [place] / Referral to community resources:  [place]  Patient/Family's Response to care:  Patient agreeable to return to ALF. Will discuss SNF after conversation with her son. Patient's son supportive and involved in patient's care. Patient appreciated social work intervention.  Patient/Family's Understanding of and Emotional Response to Diagnosis, Current Treatment, and Prognosis:  Patient has a good understanding of the reason for admission. Patient appears happy with hospital care. She has concerns regarding her finger and arm swelling.  Emotional Assessment Appearance:  Appears stated age Attitude/Demeanor/Rapport:  Engaged, Gracious Affect (typically observed):  Accepting, Appropriate, Calm, Pleasant Orientation:  Oriented to Self, Oriented to Place, Oriented to  Time, Oriented to Situation Alcohol / Substance use:  Never Used Psych involvement (Current and /or in the community):  No (Comment)  Discharge Needs  Concerns to be addressed:  Care Coordination Readmission within the last 30 days:  No Current discharge risk:  Dependent with Mobility Barriers to Discharge:  Continued Medical Work up, [place], LCSW 10/30/2017, 11:41 AM

## 2017-10-30 NOTE — Progress Notes (Addendum)
Nutrition Brief Note  RD consulted to assess nutritional status/needs.  Wt Readings from Last 15 Encounters:  10/30/17 197 lb 1.5 oz (89.4 kg)  05/01/17 193 lb 12.6 oz (87.9 kg)  04/29/17 193 lb 12.6 oz (87.9 kg)  02/16/17 201 lb 1 oz (91.2 kg)  01/15/17 198 lb (89.8 kg)  02/04/16 173 lb 14.4 oz (78.9 kg)  12/23/15 181 lb 6.4 oz (82.3 kg)  11/25/15 158 lb 12.8 oz (72 kg)  11/19/15 162 lb 3.2 oz (73.6 kg)  10/05/15 167 lb 14.4 oz (76.2 kg)  08/23/15 185 lb (83.9 kg)  08/11/15 185 lb 6.4 oz (84.1 kg)  07/29/15 179 lb 3.7 oz (81.3 kg)  04/28/15 175 lb 3.2 oz (79.5 kg)  04/14/15 181 lb (82.1 kg)   [name] is a delightful 84 y.o. female with medical history significant for a PDA not on home oxygen, PE not on anticoagulation, dyslipidemia, hypothyroidism, mild dementia, CAD status post ICD/pacemaker, colon cancer status post colostomy, anemia, combined systolic diastolic heart failure presents to the emergency department with complaints of shortness of breath, coughing and tightness in her chest. Initial evaluation reassuring for ACS but does reveal signs of volume overload.  Pt admitted with CHF.   Spoke with pt at bedside, who reports great appetite. She consumed 100% of lunch today. Pt reports that she consumes 3 meals per day at ALF; she usually does not eat one food item off her tray, due to dislike of item (ex collard greens). She confirms that she is on a no added salt diet PTA.   Pt reports that she will sometimes get choked of meats or liquids ("especially if it's too stringy"). Pt able to verbalize exercises provided by SLP and states these methods assist with swallowing and discomfort.   She endorses wt loss as a result of diuresis; she takes 40 mg lasix po daily per her report.   Pt denies any nutritional needs, however, complains that she continues to receive PB&J sandwiches. RD obtained food preferences and meal orders. Reinforced importance of no added salt restriction  to assist with fluid overload.   Nutrition-Focused physical exam completed. Findings are no fat depletion, no muscle depletion, and mild edema.   Body mass index is 31.81 kg/m. Patient meets criteria for obesity, class I based on current BMI.   Current diet order is Dysphagia 3 with thin liquids, patient is consuming approximately 100% of meals at this time. Labs and medications reviewed.   No nutrition interventions warranted at this time. If nutrition issues arise, please consult RD.   [name], RD, LDN, CDE Pager: [phone] After hours Pager: [phone]

## 2017-10-31 DIAGNOSIS — E038 Other specified hypothyroidism: Secondary | ICD-10-CM

## 2017-10-31 LAB — CBC
HCT: 28.1 % — ABNORMAL LOW (ref 36.0–46.0)
Hemoglobin: 8.4 g/dL — ABNORMAL LOW (ref 12.0–15.0)
MCH: 27.1 pg (ref 26.0–34.0)
MCHC: 29.9 g/dL — ABNORMAL LOW (ref 30.0–36.0)
MCV: 90.6 fL (ref 78.0–100.0)
PLATELETS: 195 10*3/uL (ref 150–400)
RBC: 3.1 MIL/uL — ABNORMAL LOW (ref 3.87–5.11)
RDW: 15.5 % (ref 11.5–15.5)
WBC: 5.5 10*3/uL (ref 4.0–10.5)

## 2017-10-31 LAB — BASIC METABOLIC PANEL
Anion gap: 12 (ref 5–15)
BUN: 52 mg/dL — AB (ref 6–20)
CALCIUM: 8.7 mg/dL — AB (ref 8.9–10.3)
CHLORIDE: 107 mmol/L (ref 101–111)
CO2: 22 mmol/L (ref 22–32)
CREATININE: 2.2 mg/dL — AB (ref 0.44–1.00)
GFR calc Af Amer: 23 mL/min — ABNORMAL LOW (ref >60)
GFR calc non Af Amer: 20 mL/min — ABNORMAL LOW (ref >60)
Glucose, Bld: 73 mg/dL (ref 65–99)
Potassium: 4.5 mmol/L (ref 3.5–5.1)
SODIUM: 141 mmol/L (ref 135–145)

## 2017-10-31 MED ORDER — ACETAMINOPHEN 325 MG PO TABS
650.0000 mg | ORAL_TABLET | Freq: Two times a day (BID) | ORAL | Status: DC
  Administered 2017-10-31 – 2017-11-02 (×4): 650 mg via ORAL
  Filled 2017-10-31 (×5): qty 2

## 2017-10-31 MED ORDER — SODIUM CHLORIDE 0.9 % IV SOLN: INTRAVENOUS | Status: AC

## 2017-10-31 NOTE — Plan of Care (Signed)
  Completed/Met SLP Dysphagia Goals Patient will utilize recommended strategies Description Patient will utilize recommended strategies during swallow to increase swallowing safety with 10/31/2017 1158 - Completed/Met by Colon Flattery B, CCC-SLP

## 2017-10-31 NOTE — Progress Notes (Signed)
  Speech Language Pathology Treatment: Dysphagia  Patient Details Name: [name] MRN: [id number] DOB: [birth year] Today's Date: 10/31/2017 Time: 4619-0122 SLP Time Calculation (min) (ACUTE ONLY): 21 min  Assessment / Plan / Recommendation Clinical Impression  Pt was seen at bedside for follow up after BSE compelted Monday 10/29/17. Pt underwent esophageal study, which was reported to be normal. Aspiration was not documented. Pt was observed with thin liquids, and safe swallow precautions were reviewed. Pt verbalizes awareness of her tendency to get choked on liquids, but reports she takes her time, and drinks slowly, taking small sips. No overt s/s aspiration observed on thin liquid trials. SLP spoke with MD regarding Outpatient MBS, which is currently scheduled for 11/06/17. After discussion with MD, decision was made to cancel MBS, given speech assessment and esophageal study completed during acute stay. ST will sign off at this time, but remains available if needs arise in the future, or if the decision to pursue MBS is made.    HPI HPI: 84 year old female admitted 10/28/17 with SOB. PMH significant for mild dementia, COPD, CHF, PNA (2018), coughing with liquids      SLP Plan  Discharge SLP treatment due to (comment);All goals met       Recommendations  Diet recommendations: Dysphagia 3 (mechanical soft);Thin liquid Liquids provided via: Straw;Cup Medication Administration: Whole meds with liquid Supervision: Patient able to self feed;Full supervision/cueing for compensatory strategies Compensations: Minimize environmental distractions;Slow rate;Small sips/bites Postural Changes and/or Swallow Maneuvers: Out of bed for meals;Seated upright 90 degrees                Oral Care Recommendations: Oral care BID Follow up Recommendations: None SLP Visit Diagnosis: Dysphagia, unspecified (R13.10) Plan: Discharge SLP treatment due to (comment);All goals met       GO            Celia B. Quentin Ore [facility], CCC-SLP Speech Language Pathologist [phone]  Shonna Chock 10/31/2017, 12:28 PM

## 2017-10-31 NOTE — Progress Notes (Signed)
PROGRESS NOTE    [name]  HAL:[id number] DOB: [birth year] DOA: 10/28/2017 PCP: [name], PA   Outpatient Specialists:     Brief Narrative:  [name] is a delightful 84 y.o. female with medical history significant for a PDA not on home oxygen, PE not on anticoagulation, dyslipidemia, hypothyroidism, mild dementia, CAD status post ICD/pacemaker, colon cancer status post colostomy, anemia, combined systolic diastolic heart failure presents to the emergency department with complaints of shortness of breath, coughing and tightness in her chest   Assessment & Plan:   Principal Problem:   Acute on chronic systolic CHF (congestive heart failure) ([place]) Active Problems:   COPD (chronic obstructive pulmonary disease) ([facility])   CKD (chronic kidney disease) stage 3, GFR 30-59 ml/min ([facility])   Benign essential HTN   NICM (nonischemic cardiomyopathy) ([place])   Hypothyroidism   Chest pain   Aspiration of liquid   Acute on chronic systolic heart failure.  -given too much IV lasix in ER and now appears dry -gentle IVF and restart lasix once Cr stable  Chest pain.  -CE negative -no further chest pain  Aspiration of liquid. Patient reports that she " strangles on liquid". -SLP: DYS 3 with think liquids Aspiration precautions -PPI -DG esophagus-- no sign of aspiration-- discussed with SLP and will cancel MBS scheduled for later this year  Hypertension.  -Monitor -Continue home meds as noted above   History of COPD. Presents with sob.  Not on home oxygen. Chest x-ray as noted above. Oxygen saturation level greater than 90% on room air -mucinex  AKI on Chronic kidney disease stage III. Creatinine 1.2 on admission. -patient was over diuresed in the ER and Cr rose to 2.39 -gentle IVF- monitor for Cr improvement  Hypothyroidism. -Continue home meds  Anemia -outpatient follow up  C/o stiffness from arthritis -schedule tylenol BID as she takes at ALF   DVT prophylaxis:   Lovenox   Code Status: Full Code   Family Communication:   Disposition Plan:  From ALF--- ? Needs SNF   Consultants:    Subjective: No chest pain or SOB  Objective: Vitals:   10/31/17 0517 10/31/17 0719 10/31/17 0828 10/31/17 1242  BP: (!) 120/42  (!) 137/54 (!) 127/52  Pulse: 88  82 77  Resp: 18   20  Temp: 98 F (36.7 C)   (!) 97.3 F (36.3 C)  TempSrc: Oral   Oral  SpO2: 98% 97%  99%  Weight: 91.7 kg (202 lb 1.6 oz)     Height:        Intake/Output Summary (Last 24 hours) at 10/31/2017 1413 Last data filed at 10/31/2017 0930 Gross per 24 hour  Intake 712.83 ml  Output 500 ml  Net 212.83 ml   Filed Weights   10/29/17 0313 10/30/17 0519 10/31/17 0517  Weight: 90.4 kg (199 lb 4.8 oz) 89.4 kg (197 lb 1.5 oz) 91.7 kg (202 lb 1.6 oz)    Examination:  General exam: up in chair Respiratory system: on room air, no increased work of breathing Cardiovascular system: rrr Gastrointestinal system: +BS ,soft Central nervous system: alert Skin: min edema, patient is obese     Data Reviewed: I have personally reviewed following labs and imaging studies  CBC: Recent Labs  Lab 10/28/17 0445 10/29/17 0214 10/30/17 0502 10/31/17 0902  WBC 4.6 5.1 5.2 5.5  NEUTROABS 2.4  --   --   --   HGB 9.4* 8.6* 9.2* 8.4*  HCT 30.5* 27.8* 29.5* 28.1*  MCV  88.9 89.4 90.2 90.6  PLT 231 213 228 397   Basic Metabolic Panel: Recent Labs  Lab 10/28/17 0445 10/29/17 0214 10/30/17 0502 10/31/17 0548  NA 139 137 140 141  K 4.3 4.2 4.6 4.5  CL 105 102 104 107  CO2 23 24 23 22   GLUCOSE 79 76 82 73  BUN 26* 31* 50* 52*  CREATININE 1.21* 1.75* 2.39* 2.20*  CALCIUM 9.5 9.2 9.1 8.7*   GFR: Estimated Creatinine Clearance: 22.9 mL/min (A) (by C-G formula based on SCr of 2.2 mg/dL (H)). Liver Function Tests: Recent Labs  Lab 10/30/17 0502  AST 18  ALT 11*  ALKPHOS 73  BILITOT 0.5  PROT 6.6  ALBUMIN 3.5   No results for input(s): LIPASE, AMYLASE in the last 168  hours. No results for input(s): AMMONIA in the last 168 hours. Coagulation Profile: No results for input(s): INR, PROTIME in the last 168 hours. Cardiac Enzymes: Recent Labs  Lab 10/28/17 0613 10/28/17 2029 10/29/17 0214  TROPONINI <0.03 <0.03 <0.03   BNP (last 3 results) No results for input(s): PROBNP in the last 8760 hours. HbA1C: No results for input(s): HGBA1C in the last 72 hours. CBG: No results for input(s): GLUCAP in the last 168 hours. Lipid Profile: Recent Labs    10/28/17 1542  CHOL 117  HDL 46  LDLCALC 63  TRIG 41  CHOLHDL 2.5   Thyroid Function Tests: No results for input(s): TSH, T4TOTAL, FREET4, T3FREE, THYROIDAB in the last 72 hours. Anemia Panel: No results for input(s): VITAMINB12, FOLATE, FERRITIN, TIBC, IRON, RETICCTPCT in the last 72 hours. Urine analysis:    Component Value Date/Time   COLORURINE YELLOW 10/28/2017 [place] 10/28/2017 0539   LABSPEC 1.010 10/28/2017 0539   PHURINE 7.0 10/28/2017 0539   GLUCOSEU NEGATIVE 10/28/2017 0539   HGBUR NEGATIVE 10/28/2017 0539   BILIRUBINUR NEGATIVE 10/28/2017 0539   KETONESUR NEGATIVE 10/28/2017 0539   PROTEINUR NEGATIVE 10/28/2017 0539   UROBILINOGEN 0.2 07/23/2015 1610   NITRITE NEGATIVE 10/28/2017 0539   LEUKOCYTESUR NEGATIVE 10/28/2017 0539     ) Recent Results (from the past 240 hour(s))  MRSA PCR Screening     Status: None   Collection Time: 10/28/17  4:42 PM  Result Value Ref Range Status   MRSA by PCR NEGATIVE NEGATIVE Final    Comment:        The GeneXpert MRSA Assay (FDA approved for NASAL specimens only), is one component of a comprehensive MRSA colonization surveillance program. It is not intended to diagnose MRSA infection nor to guide or monitor treatment for MRSA infections.       Anti-infectives (From admission, onward)   None       Radiology Studies: Dg Chest 2 View  Result Date: 10/30/2017 CLINICAL DATA:  Cough EXAM: CHEST  2 VIEW  COMPARISON:  10/28/2017 FINDINGS: Cardiac shadow is stable. Defibrillator is again noted and stable. Poor inspiratory effort is noted. The right lung remains clear. Persistent infiltrative changes in the left base are noted. Degenerative changes of the thoracic spine are noted. IMPRESSION: Mild left basilar infiltrate stable from the prior exam Electronically Signed   By: Inez Catalina M.D.   On: 10/30/2017 14:32   Dg Esophagus  Result Date: 10/30/2017 CLINICAL DATA:  Coughing with liquids. Intermittent sensation of solids sticking in her throat. EXAM: ESOPHOGRAM/BARIUM SWALLOW TECHNIQUE: Single contrast examination was performed using  thin barium. FLUOROSCOPY TIME:  Fluoroscopy Time:  1 minute, 42 seconds. Radiation Exposure Index (if provided by  the fluoroscopic device): 16.8 mGy. Number of Acquired Spot Images: 0 COMPARISON:  CT chest dated April 22, 2017. FINDINGS: Primary peristaltic waves in the esophagus were normal. No obstruction to the forward flow of contrast throughout the esophagus and into the stomach. Normal esophageal course and contour. Normal esophageal mucosal pattern. No esophageal stricture, ulceration, or other significant abnormality. A 13 mm barium tablet passed without difficulty into the stomach. IMPRESSION: 1. Normal esophagram. Electronically Signed   By: Titus Dubin M.D.   On: 10/30/2017 15:57        Scheduled Meds: . acetaminophen  650 mg Oral BID  . aspirin EC  325 mg Oral Daily  . atorvastatin  20 mg Oral Daily  . carvedilol  6.25 mg Oral BID WC  . enoxaparin (LOVENOX) injection  30 mg Subcutaneous Daily  . guaiFENesin  600 mg Oral BID  . hydrALAZINE  12.5 mg Oral TID  . iron polysaccharides  150 mg Oral BID  . isosorbide mononitrate  30 mg Oral Daily  . levothyroxine  50 mcg Oral QAC breakfast  . loratadine  10 mg Oral Daily  . mometasone-formoterol  2 puff Inhalation BID  . pantoprazole  40 mg Oral Daily  . polyvinyl alcohol  1 drop Both Eyes TID  .  potassium chloride  10 mEq Oral Daily  . tamsulosin  0.4 mg Oral QPC supper   Continuous Infusions: . sodium chloride 50 mL/hr at 10/31/17 1153     LOS: 1 day    Time spent: 25 min    Geradine Girt, DO [facility] Pager [phone]  If 7PM-7AM, please contact night-coverage [email or web address] Password [facility] 10/31/2017, 2:13 PM

## 2017-11-01 ENCOUNTER — Inpatient Hospital Stay (HOSPITAL_COMMUNITY): Payer: Medicare HMO

## 2017-11-01 LAB — CBC
HCT: 28.6 % — ABNORMAL LOW (ref 36.0–46.0)
HEMOGLOBIN: 8.6 g/dL — AB (ref 12.0–15.0)
MCH: 27.3 pg (ref 26.0–34.0)
MCHC: 30.1 g/dL (ref 30.0–36.0)
MCV: 90.8 fL (ref 78.0–100.0)
Platelets: 203 10*3/uL (ref 150–400)
RBC: 3.15 MIL/uL — AB (ref 3.87–5.11)
RDW: 15.6 % — ABNORMAL HIGH (ref 11.5–15.5)
WBC: 5.4 10*3/uL (ref 4.0–10.5)

## 2017-11-01 LAB — BASIC METABOLIC PANEL
ANION GAP: 9 (ref 5–15)
BUN: 36 mg/dL — ABNORMAL HIGH (ref 6–20)
CO2: 24 mmol/L (ref 22–32)
Calcium: 9 mg/dL (ref 8.9–10.3)
Chloride: 108 mmol/L (ref 101–111)
Creatinine, Ser: 1.34 mg/dL — ABNORMAL HIGH (ref 0.44–1.00)
GFR calc non Af Amer: 36 mL/min — ABNORMAL LOW (ref >60)
GFR, EST AFRICAN AMERICAN: 42 mL/min — AB (ref >60)
GLUCOSE: 82 mg/dL (ref 65–99)
POTASSIUM: 4.6 mmol/L (ref 3.5–5.1)
Sodium: 141 mmol/L (ref 135–145)

## 2017-11-01 MED ORDER — ENOXAPARIN SODIUM 40 MG/0.4ML ~~LOC~~ SOLN
40.0000 mg | Freq: Every day | SUBCUTANEOUS | Status: DC
  Administered 2017-11-02: 40 mg via SUBCUTANEOUS
  Filled 2017-11-01: qty 0.4

## 2017-11-01 MED ORDER — TRAMADOL HCL 50 MG PO TABS
50.0000 mg | ORAL_TABLET | Freq: Two times a day (BID) | ORAL | Status: DC | PRN
  Administered 2017-11-01 – 2017-11-02 (×2): 50 mg via ORAL
  Filled 2017-11-01 (×2): qty 1

## 2017-11-01 MED ORDER — DICLOFENAC SODIUM 1 % TD GEL
2.0000 g | Freq: Four times a day (QID) | TRANSDERMAL | Status: DC
  Administered 2017-11-01 – 2017-11-02 (×4): 2 g via TOPICAL
  Filled 2017-11-01: qty 100

## 2017-11-01 MED ORDER — HYDRALAZINE HCL 25 MG PO TABS
25.0000 mg | ORAL_TABLET | Freq: Three times a day (TID) | ORAL | Status: DC
  Administered 2017-11-01 – 2017-11-02 (×3): 25 mg via ORAL
  Filled 2017-11-01 (×3): qty 1

## 2017-11-01 MED ORDER — FUROSEMIDE 40 MG PO TABS
60.0000 mg | ORAL_TABLET | Freq: Every day | ORAL | Status: DC
  Administered 2017-11-01 – 2017-11-02 (×2): 60 mg via ORAL
  Filled 2017-11-01 (×2): qty 1

## 2017-11-01 NOTE — Progress Notes (Signed)
PROGRESS NOTE    [name]  YQI:[id number] DOB: [birth year] DOA: 10/28/2017 PCP: [name], PA   Outpatient Specialists:     Brief Narrative:  [name] is a delightful 84 y.o. female with medical history significant for a PDA not on home oxygen, PE not on anticoagulation, dyslipidemia, hypothyroidism, mild dementia, CAD status post ICD/pacemaker, colon cancer status post colostomy, anemia, combined systolic diastolic heart failure presents to the emergency department with complaints of shortness of breath, coughing and tightness in her chest.  In the ER, she was given IV Lasix and appears to have been over-diuresed resulting in AKI.  Her Cr gradually improved with gentle IVF.  Suspect her SOB/cough was due to aspiration.  With PPI/modified diet/aspiration precautions her cough has resolved and mucous production decreased.  Now c/o right hip pain-- may need SNF   Assessment & Plan:   Principal Problem:   Acute on chronic systolic CHF (congestive heart failure) ([facility]) Active Problems:   COPD (chronic obstructive pulmonary disease) (H[facility])   CKD (chronic kidney disease) stage 3, GFR 30-59 ml/min ([facility])   Benign essential HTN   NICM (nonischemic cardiomyopathy) ([facility])   Hypothyroidism   Chest pain   Aspiration of liquid   Acute on chronic systolic heart failure.  -given too much IV lasix in ER and now appears dry -restart lasix today as CR stable  Right hip pain -suspect arthritis -dg hip -voltaren gel -PT eval/ambulation  Chest pain.  -CE negative -no further chest pain  Aspiration of liquid. Patient reports that she " strangles on liquid". -SLP: DYS 3 with thin liquids Aspiration precautions -PPI -DG esophagus-- no sign of aspiration-- discussed with SLP and will cancel MBS scheduled for later this year as work up essentially done and patient has improved  Hypertension.  -Monitor -Continue home meds as noted above   History of COPD. Presents with sob.  Not  on home oxygen. Chest x-ray as noted above. Oxygen saturation level greater than 90% on room air -mucinex  AKI on Chronic kidney disease stage III. Creatinine 1.2 on admission. -patient was over diuresed in the ER and Cr rose to 2.39 -gentle IVF- monitor for Cr improvement  Hypothyroidism. -Continue home meds  Anemia -outpatient follow up  C/o stiffness from arthritis -schedule tylenol BID as she takes at ALF   DVT prophylaxis:  Lovenox   Code Status: Full Code   Family Communication:   Disposition Plan:  From ALF--- ? Needs SNF   Consultants:    Subjective: Right hip pain  Objective: Vitals:   11/01/17 0555 11/01/17 0818 11/01/17 0840 11/01/17 1203  BP: (!) 135/52 (!) 161/58  (!) 156/68  Pulse: 82 87  78  Resp: 18   18  Temp: 98.8 F (37.1 C)   98 F (36.7 C)  TempSrc: Oral   Oral  SpO2: 100% 96% 95% 97%  Weight: 92.4 kg (203 lb 12.8 oz)     Height:        Intake/Output Summary (Last 24 hours) at 11/01/2017 1357 Last data filed at 11/01/2017 1204 Gross per 24 hour  Intake 360 ml  Output 1870 ml  Net -1510 ml   Filed Weights   10/30/17 0519 10/31/17 0517 11/01/17 0555  Weight: 89.4 kg (197 lb 1.5 oz) 91.7 kg (202 lb 1.6 oz) 92.4 kg (203 lb 12.8 oz)    Examination:  General exam: less congested but appears to be in pain when moving right hip Respiratory system: no wheezing Cardiovascular system: rrr  Gastrointestinal system: +BS ,soft Central nervous system: alert Skin: patient is obese , areas with dry skin     Data Reviewed: I have personally reviewed following labs and imaging studies  CBC: Recent Labs  Lab 10/28/17 0445 10/29/17 0214 10/30/17 0502 10/31/17 0902 11/01/17 0801  WBC 4.6 5.1 5.2 5.5 5.4  NEUTROABS 2.4  --   --   --   --   HGB 9.4* 8.6* 9.2* 8.4* 8.6*  HCT 30.5* 27.8* 29.5* 28.1* 28.6*  MCV 88.9 89.4 90.2 90.6 90.8  PLT 231 213 228 195 867   Basic Metabolic Panel: Recent Labs  Lab 10/28/17 0445  10/29/17 0214 10/30/17 0502 10/31/17 0548 11/01/17 0801  NA 139 137 140 141 141  K 4.3 4.2 4.6 4.5 4.6  CL 105 102 104 107 108  CO2 23 24 23 22 24   GLUCOSE 79 76 82 73 82  BUN 26* 31* 50* 52* 36*  CREATININE 1.21* 1.75* 2.39* 2.20* 1.34*  CALCIUM 9.5 9.2 9.1 8.7* 9.0   GFR: Estimated Creatinine Clearance: 37.7 mL/min (A) (by C-G formula based on SCr of 1.34 mg/dL (H)). Liver Function Tests: Recent Labs  Lab 10/30/17 0502  AST 18  ALT 11*  ALKPHOS 73  BILITOT 0.5  PROT 6.6  ALBUMIN 3.5   No results for input(s): LIPASE, AMYLASE in the last 168 hours. No results for input(s): AMMONIA in the last 168 hours. Coagulation Profile: No results for input(s): INR, PROTIME in the last 168 hours. Cardiac Enzymes: Recent Labs  Lab 10/28/17 0613 10/28/17 2029 10/29/17 0214  TROPONINI <0.03 <0.03 <0.03   BNP (last 3 results) No results for input(s): PROBNP in the last 8760 hours. HbA1C: No results for input(s): HGBA1C in the last 72 hours. CBG: No results for input(s): GLUCAP in the last 168 hours. Lipid Profile: No results for input(s): CHOL, HDL, LDLCALC, TRIG, CHOLHDL, LDLDIRECT in the last 72 hours. Thyroid Function Tests: No results for input(s): TSH, T4TOTAL, FREET4, T3FREE, THYROIDAB in the last 72 hours. Anemia Panel: No results for input(s): VITAMINB12, FOLATE, FERRITIN, TIBC, IRON, RETICCTPCT in the last 72 hours. Urine analysis:    Component Value Date/Time   COLORURINE YELLOW 10/28/2017 [place] 10/28/2017 0539   LABSPEC 1.010 10/28/2017 0539   PHURINE 7.0 10/28/2017 0539   GLUCOSEU NEGATIVE 10/28/2017 0539   HGBUR NEGATIVE 10/28/2017 0539   BILIRUBINUR NEGATIVE 10/28/2017 0539   KETONESUR NEGATIVE 10/28/2017 0539   PROTEINUR NEGATIVE 10/28/2017 0539   UROBILINOGEN 0.2 07/23/2015 1610   NITRITE NEGATIVE 10/28/2017 0539   LEUKOCYTESUR NEGATIVE 10/28/2017 0539     ) Recent Results (from the past 240 hour(s))  MRSA PCR Screening      Status: None   Collection Time: 10/28/17  4:42 PM  Result Value Ref Range Status   MRSA by PCR NEGATIVE NEGATIVE Final    Comment:        The GeneXpert MRSA Assay (FDA approved for NASAL specimens only), is one component of a comprehensive MRSA colonization surveillance program. It is not intended to diagnose MRSA infection nor to guide or monitor treatment for MRSA infections.       Anti-infectives (From admission, onward)   None       Radiology Studies: Dg Chest 2 View  Result Date: 10/30/2017 CLINICAL DATA:  Cough EXAM: CHEST  2 VIEW COMPARISON:  10/28/2017 FINDINGS: Cardiac shadow is stable. Defibrillator is again noted and stable. Poor inspiratory effort is noted. The right lung remains clear. Persistent infiltrative changes  in the left base are noted. Degenerative changes of the thoracic spine are noted. IMPRESSION: Mild left basilar infiltrate stable from the prior exam Electronically Signed   By: Inez Catalina M.D.   On: 10/30/2017 14:32   Dg Esophagus  Result Date: 10/30/2017 CLINICAL DATA:  Coughing with liquids. Intermittent sensation of solids sticking in her throat. EXAM: ESOPHOGRAM/BARIUM SWALLOW TECHNIQUE: Single contrast examination was performed using  thin barium. FLUOROSCOPY TIME:  Fluoroscopy Time:  1 minute, 42 seconds. Radiation Exposure Index (if provided by the fluoroscopic device): 16.8 mGy. Number of Acquired Spot Images: 0 COMPARISON:  CT chest dated April 22, 2017. FINDINGS: Primary peristaltic waves in the esophagus were normal. No obstruction to the forward flow of contrast throughout the esophagus and into the stomach. Normal esophageal course and contour. Normal esophageal mucosal pattern. No esophageal stricture, ulceration, or other significant abnormality. A 13 mm barium tablet passed without difficulty into the stomach. IMPRESSION: 1. Normal esophagram. Electronically Signed   By: Titus Dubin M.D.   On: 10/30/2017 15:57        Scheduled  Meds: . acetaminophen  650 mg Oral BID  . aspirin EC  325 mg Oral Daily  . atorvastatin  20 mg Oral Daily  . carvedilol  6.25 mg Oral BID WC  . diclofenac sodium  2 g Topical QID  . [START ON 11/02/2017] enoxaparin (LOVENOX) injection  40 mg Subcutaneous Daily  . furosemide  60 mg Oral Daily  . guaiFENesin  600 mg Oral BID  . hydrALAZINE  25 mg Oral TID  . iron polysaccharides  150 mg Oral BID  . isosorbide mononitrate  30 mg Oral Daily  . levothyroxine  50 mcg Oral QAC breakfast  . loratadine  10 mg Oral Daily  . mometasone-formoterol  2 puff Inhalation BID  . pantoprazole  40 mg Oral Daily  . polyvinyl alcohol  1 drop Both Eyes TID  . potassium chloride  10 mEq Oral Daily  . tamsulosin  0.4 mg Oral QPC supper   Continuous Infusions:    LOS: 2 days    Time spent: 25 min    Geradine Girt, DO [facility] Pager [phone]  If 7PM-7AM, please contact night-coverage [email or web address] Password [facility] 11/01/2017, 1:57 PM

## 2017-11-02 DIAGNOSIS — R1310 Dysphagia, unspecified: Secondary | ICD-10-CM

## 2017-11-02 DIAGNOSIS — N183 Chronic kidney disease, stage 3 (moderate): Secondary | ICD-10-CM

## 2017-11-02 DIAGNOSIS — R600 Localized edema: Secondary | ICD-10-CM

## 2017-11-02 DIAGNOSIS — E039 Hypothyroidism, unspecified: Secondary | ICD-10-CM

## 2017-11-02 DIAGNOSIS — I10 Essential (primary) hypertension: Secondary | ICD-10-CM

## 2017-11-02 DIAGNOSIS — J439 Emphysema, unspecified: Secondary | ICD-10-CM

## 2017-11-02 DIAGNOSIS — T17998D Other foreign object in respiratory tract, part unspecified causing other injury, subsequent encounter: Secondary | ICD-10-CM

## 2017-11-02 DIAGNOSIS — I5023 Acute on chronic systolic (congestive) heart failure: Secondary | ICD-10-CM

## 2017-11-02 DIAGNOSIS — I428 Other cardiomyopathies: Secondary | ICD-10-CM

## 2017-11-02 MED ORDER — CARVEDILOL 3.125 MG PO TABS: 6.2500 mg | ORAL_TABLET | Freq: Two times a day (BID) | ORAL | 0 refills | Status: DC

## 2017-11-02 MED ORDER — DICLOFENAC SODIUM 1 % TD GEL: 2.0000 g | Freq: Four times a day (QID) | TRANSDERMAL | 0 refills | Status: DC

## 2017-11-02 NOTE — Discharge Instructions (Addendum)
Heart Failure Exacerbation  Heart failure is a condition in which the heart does not fill up with enough blood, and therefore does not pump enough blood and oxygen to the body. When this happens, parts of the body do not get the blood and oxygen they need to function properly. This can cause symptoms such as breathing problems, fatigue, swelling, and confusion. Heart failure exacerbation refers to heart failure symptoms that get worse. The symptoms may get worse suddenly or develop slowly over time. Heart failure exacerbation is a serious medical problem that should be treated right away. What are the causes? A heart failure exacerbation can be triggered by:  Not taking your heart failure medicines correctly.  Infections.  Eating an unhealthy diet or a diet that is high in salt (sodium).  Drinking too much fluid.  Drinking alcohol.  Taking illegal drugs, such as cocaine or methamphetamine.  Not exercising.  Other causes include:  Other heart conditions such as an irregular heartbeat (arrhythmia).  Anemia.  Other medical problems, such as kidney failure.  Sometimes the cause of the exacerbation is not known. What are the signs or symptoms? When heart failure symptoms suddenly or slowly get worse, this may be a sign of heart failure exacerbation. Symptoms of heart failure include:  Breathing problems or shortness of breath.  Chronic coughing or wheezing.  Fatigue.  Nausea or lack of appetite.  Feeling light-headed.  Confusion or memory loss.  Increased heart rate or irregular heartbeat.  Buildup of fluid in the legs, ankles, feet, or abdomen.  Difficulty breathing when lying down.  How is this diagnosed? This condition is diagnosed based on:  Your symptoms and medical history.  A physical exam.  You may also have tests, including:  Electrocardiogram (ECG). This test measures the electrical activity of your heart.  Echocardiogram. This test uses sound waves  to take a picture of your heart to see how well it works.  Blood tests.  Imaging tests, such as: ? Chest X-ray. ? MRI. ? Ultrasound.  Stress test. This test examines how well your heart functions when you exercise. Your heart is monitored while you exercise on a treadmill or exercise bike. If you cannot exercise, medicines may be used to increase your heartbeat in place of exercise.  Cardiac catheterization. During this test, a thin, flexible tube (catheter) is inserted into a blood vessel and threaded up to your heart. This test allows your health care provider to check the arteries that lead to your heart (coronary arteries).  Right heart catheterization. During this test, the pressure in your heart is measured.  How is this treated? This condition may be treated by:  Adjusting your heart medicines.  Maintaining a healthy lifestyle. This includes: ? Eating a heart-healthy diet that is low in sodium. ? Not using any products that contain nicotine or tobacco, such as cigarettes and e-cigarettes. ? Regular exercise. ? Monitoring your fluid intake. ? Monitoring your weight and reporting changes to your health care provider.  Treating sleep apnea, if you have this condition.  Surgery. This may include: ? Implanting a device that helps both sides of your heart contract at the same time (cardiac resynchronization therapy device). This can help with heart function and relieve heart failure symptoms. ? Implanting a device that can correct heart rhythm problems (implantable cardioverter defibrillator). ? Connecting a device to your heart to help it pump blood (ventricular assist device). ? Heart transplant.  Follow these instructions at home: Medicines  Take over-the-counter and prescription   medicines only as told by your health care provider.  Do not stop taking your medicines or change the amount you take. If you are having problems or side effects from your medicines, talk to your  health care provider.  If you are having difficulty paying for your medicines, contact a social worker or your clinic. There are many programs to assist with medicine costs.  Talk to your health care provider before starting any new medicines or supplements.  Make sure your health care provider and pharmacist have a list of all the medicines you are taking. Eating and drinking  Avoid drinking alcohol.  Eat a heart-healthy diet as told by your health care provider. This includes: ? Plenty of fruits and vegetables. ? Lean proteins. ? Low-fat dairy. ? Whole grains. ? Foods that are low in sodium. Activity  Exercise regularly as told by your health care provider. Balance exercise with rest.  Ask your health care provider what activities are safe for you. This includes sexual activity, exercise, and daily tasks at home or work. Lifestyle  Do not use any products that contain nicotine or tobacco, such as cigarettes and e-cigarettes. If you need help quitting, ask your health care provider.  Maintain a healthy weight. Ask your health care provider what weight is healthy for you.  Consider joining a patient support group. This can help with emotional problems you may have, such as stress and anxiety. General instructions  Talk to your health care provider about flu and pneumonia vaccines.  Keep a list of medicines that you are taking. This may help in emergency situations.  Keep all follow-up visits as told by your health care provider. This is important. Contact a health care provider if:  You have questions about your medicines or you miss a dose.  You feel anxious, depressed, or stressed.  You have swelling in your feet, ankles, legs, or abdomen.  You have shortness of breath during activity or exercise.  You have a cough.  You have a fever.  You have trouble sleeping.  You gain 2-3 lb (1-1.4 kg) in 24 hours or 5 lb (2.3 kg) in a week. Get help right away if:  You  have chest pain.  You have shortness of breath while resting.  You have severe fatigue.  You are confused.  You have severe dizziness.  You have a rapid or irregular heartbeat.  You have nausea or you vomit.  You have a cough that is worse at night or you cannot lie flat.  You have a cough that will not go away.  You have severe depression or sadness. Summary  When heart failure symptoms get worse, it is called heart failure exacerbation.  Common causes of this condition include taking medicines incorrectly, infections, and drinking alcohol.  This condition may be treated by adjusting medicines, maintaining a healthy lifestyle, or surgery.  Do not stop taking your medicines or change the amount you take. If you are having problems or side effects from your medicines, talk to your health care provider. This information is not intended to replace advice given to you by your health care provider. Make sure you discuss any questions you have with your health care provider. Document Released: 02/06/2017 Document Revised: 02/06/2017 Document Reviewed: 02/06/2017 [employer] Interactive Patient Education  2018 [employer]  

## 2017-11-02 NOTE — [place] FL2 (Signed)
[place] MEDICAID FL2 LEVEL OF CARE SCREENING TOOL     IDENTIFICATION  Patient Name: [name] Birthdate: 1934/01/13 Sex: female Admission Date (Current Location): 10/28/2017  [facility] and [place] Number:  Herbalist and Address:  [facility] [place], [place] [address], [place],  [address]      Provider Number: [id number]  Attending Physician Name and Address:  Mariel Aloe, MD  Relative Name and Phone Number:       Current Level of Care: Hospital Recommended Level of Care: Assisted Living Facility(with PT) Prior Approval Number:    Date Approved/Denied:   PASRR Number:    Discharge Plan: Other (Comment)(ALF with PT)    Current Diagnoses: Patient Active Problem List   Diagnosis Date Noted  . Chest pain 10/28/2017  . Aspiration of liquid 10/28/2017  . Bilateral lower extremity edema   . Pneumonia 04/22/2017  . HCAP (healthcare-associated pneumonia) 04/22/2017  . Altered mental status   . Hypotension 02/16/2017  . Syncope 02/02/2016  . Faintness   . Essential hypertension 11/16/2015  . Hypothyroidism 11/16/2015  . Acute on chronic systolic CHF (congestive heart failure) ([place]) 11/15/2015  . Hypertensive heart disease 10/04/2015  . Elevated troponin 10/04/2015  . Acute respiratory failure ([place])   . Acute systolic congestive heart failure ([place])   . NICM (nonischemic cardiomyopathy) ([place]) 08/11/2015  . Normal coronary arteries 08/11/2015  . Dyslipidemia 07/27/2015  . CKD (chronic kidney disease) stage 3, GFR 30-59 ml/min (HCC) 07/27/2015  . Anemia 07/27/2015  . Benign essential HTN 07/27/2015  . Hypothyroidism, adult 07/27/2015  . FUO (fever of unknown origin) 07/27/2015  . IDA (iron deficiency anemia) 07/27/2015  . Chronic systolic CHF (congestive heart failure) ([place]) 07/27/2015  . Colon cancer ([place]) 07/27/2015  . Sepsis ([place]) 07/24/2015  . Acute encephalopathy 07/24/2015  . Hypokalemia 07/24/2015  . Dementia 07/23/2015   . COPD (chronic obstructive pulmonary disease) ([place]) 04/25/2015  . ICD in place 04/14/2015  . Colostomy in place [facility]) 03/01/2015    Orientation RESPIRATION BLADDER Height & Weight     Self, Time, Situation, Place  Normal Continent Weight: 198 lb 12.8 oz (90.2 kg) Height:  5\' 6"  (167.6 cm)  BEHAVIORAL SYMPTOMS/MOOD NEUROLOGICAL BOWEL NUTRITION STATUS  (None) (Dementia) Continent Diet(DYS 3: Mechanical soft)  AMBULATORY STATUS COMMUNICATION OF NEEDS Skin   Limited Assist Verbally Normal                       Personal Care Assistance Level of Assistance              Functional Limitations Info  Sight, Hearing, Speech Sight Info: Adequate Hearing Info: Adequate Speech Info: Adequate    SPECIAL CARE FACTORS FREQUENCY  PT (By licensed PT), Blood pressure     PT Frequency: 3 x week              Contractures Contractures Info: Not present    Additional Factors Info  Code Status, Allergies Code Status Info: Full Allergies Info: NKDA           Current Medications (11/02/2017):  This is the current hospital active medication list Current Facility-Administered Medications  Medication Dose Route Frequency Provider Last Rate Last Dose  . acetaminophen (TYLENOL) tablet 650 mg  650 mg Oral BID Eulogio Bear U, DO   650 mg at 11/02/17 0910  . aspirin EC tablet 325 mg  325 mg Oral Daily Black, Lezlie Octave, NP   325 mg at  11/02/17 0911  . atorvastatin (LIPITOR) tablet 20 mg  20 mg Oral Daily Radene Gunning, NP   20 mg at 11/02/17 0911  . benzonatate (TESSALON) capsule 100 mg  100 mg Oral TID PRN Radene Gunning, NP   100 mg at 10/28/17 2134  . carvedilol (COREG) tablet 6.25 mg  6.25 mg Oral BID WC Radene Gunning, NP   6.25 mg at 11/02/17 0911  . diclofenac sodium (VOLTAREN) 1 % transdermal gel 2 g  2 g Topical QID Vann, Jessica U, DO   2 g at 11/02/17 0912  . enoxaparin (LOVENOX) injection 40 mg  40 mg Subcutaneous Daily Eulogio Bear U, DO   40 mg at 11/02/17 0093  .  furosemide (LASIX) tablet 60 mg  60 mg Oral Daily Eulogio Bear U, DO   60 mg at 11/02/17 0910  . guaiFENesin (MUCINEX) 12 hr tablet 600 mg  600 mg Oral BID Eulogio Bear U, DO   600 mg at 11/02/17 8182  . hydrALAZINE (APRESOLINE) tablet 25 mg  25 mg Oral TID Eulogio Bear U, DO   25 mg at 11/02/17 0911  . iron polysaccharides (NIFEREX) capsule 150 mg  150 mg Oral BID Radene Gunning, NP   150 mg at 11/02/17 0911  . isosorbide mononitrate (IMDUR) 24 hr tablet 30 mg  30 mg Oral Daily Radene Gunning, NP   30 mg at 11/02/17 0910  . levothyroxine (SYNTHROID, LEVOTHROID) tablet 50 mcg  50 mcg Oral QAC breakfast Phillips Grout, MD   50 mcg at 11/02/17 0910  . loratadine (CLARITIN) tablet 10 mg  10 mg Oral Daily Radene Gunning, NP   10 mg at 11/02/17 0910  . mometasone-formoterol (DULERA) 200-5 MCG/ACT inhaler 2 puff  2 puff Inhalation BID Radene Gunning, NP   2 puff at 11/02/17 0720  . nitroGLYCERIN (NITROSTAT) SL tablet 0.4 mg  0.4 mg Sublingual Q5 min PRN Ward, Kristen N, DO      . ondansetron (ZOFRAN) injection 4 mg  4 mg Intravenous Q6H PRN Black, Karen M, NP      . oxymetazoline (AFRIN) 0.05 % nasal spray 1 spray  1 spray Each Nare BID PRN Phillips Grout, MD      . pantoprazole (PROTONIX) EC tablet 40 mg  40 mg Oral Daily Radene Gunning, NP   40 mg at 11/02/17 0911  . polyvinyl alcohol (LIQUIFILM TEARS) 1.4 % ophthalmic solution 1 drop  1 drop Both Eyes TID Phillips Grout, MD   1 drop at 11/01/17 2056  . potassium chloride (K-DUR,KLOR-CON) CR tablet 10 mEq  10 mEq Oral Daily Radene Gunning, NP   10 mEq at 11/02/17 0910  . tamsulosin (FLOMAX) capsule 0.4 mg  0.4 mg Oral QPC supper Radene Gunning, NP   0.4 mg at 11/01/17 1649  . traMADol (ULTRAM) tablet 50 mg  50 mg Oral Q12H PRN Eulogio Bear U, DO   50 mg at 11/02/17 0509     Discharge Medications: TAKE these medications   acetaminophen 500 MG tablet Commonly known as:  TYLENOL Take 1,000 mg by mouth 2 (two) times daily. What changed:   Another medication with the same name was removed. Continue taking this medication, and follow the directions you see here.   albuterol (2.5 MG/3ML) 0.083% nebulizer solution Commonly known as:  PROVENTIL Take 3 mLs (2.5 mg total) by nebulization every 4 (four) hours as needed for wheezing.   aspirin EC 81 MG  tablet Take 1 tablet (81 mg total) by mouth every morning.   atorvastatin 20 MG tablet Commonly known as:  LIPITOR Take 20 mg by mouth daily.   benzonatate 100 MG capsule Commonly known as:  TESSALON Take 1 capsule (100 mg total) by mouth 3 (three) times daily as needed for cough.   carvedilol 3.125 MG tablet Commonly known as:  COREG Take 2 tablets (6.25 mg total) by mouth 2 (two) times daily with a meal.   diclofenac sodium 1 % Gel Commonly known as:  VOLTAREN Apply 2 g topically 4 (four) times daily.   FERREX 150 150 MG capsule Generic drug:  iron polysaccharides Take 150 mg by mouth 2 (two) times daily.   FLORANEX PO Take 1 tablet by mouth 3 (three) times daily with meals.   Fluticasone-Salmeterol 500-50 MCG/DOSE Aepb Commonly known as:  ADVAIR Inhale 1 puff into the lungs 2 (two) times daily. Rinse mouth after use   furosemide 40 MG tablet Commonly known as:  LASIX Take 1 tablet (40 mg total) by mouth daily.   hydrALAZINE 25 MG tablet Commonly known as:  APRESOLINE Take 0.5 tablets (12.5 mg total) by mouth 3 (three) times daily.   isosorbide mononitrate 30 MG 24 hr tablet Commonly known as:  IMDUR Take 1 tablet (30 mg total) by mouth daily.   levothyroxine 50 MCG tablet Commonly known as:  SYNTHROID, LEVOTHROID Take 50 mcg by mouth daily.   lisinopril 10 MG tablet Commonly known as:  PRINIVIL,ZESTRIL Take 1 tablet (10 mg total) by mouth daily. What changed:  how much to take   loratadine 10 MG tablet Commonly known as:  CLARITIN Take 10 mg by mouth daily.   oxymetazoline 0.05 % nasal spray Commonly known as:  AFRIN Place 1 spray  into both nostrils 2 (two) times daily as needed (for nose bleeds). Reported on 12/23/2015   potassium chloride 10 MEQ tablet Commonly known as:  K-DUR Take 1 tablet (10 mEq total) by mouth daily.   ranitidine 150 MG tablet Commonly known as:  ZANTAC Take 150 mg by mouth daily.   REFRESH 1 % ophthalmic solution Generic drug:  carboxymethylcellulose Place 1 drop into both eyes 3 (three) times daily.   tamsulosin 0.4 MG Caps capsule Commonly known as:  FLOMAX Take 1 capsule (0.4 mg total) by mouth daily after supper.      Relevant Imaging Results:  Relevant Lab Results:   Additional Information SS#: [id number]  Candie Chroman, LCSW

## 2017-11-02 NOTE — Progress Notes (Signed)
Called report to [facility]. Also called patient's son and informed him of her discharge. Reviewed AVS with patient. Answered her questions. Pt is stable and ready for discharge. Transport just showed up to pick up patient.

## 2017-11-02 NOTE — Care Management Important Message (Signed)
Important Message  Patient Details  Name: [name] MRN: [id number] Date of Birth: Apr 14, 1934   Medicare Important Message Given:  Yes    Carles Collet, RN 11/02/2017, 11:27 AM

## 2017-11-02 NOTE — Clinical Social Work Note (Signed)
CSW facilitated patient discharge including contacting facility to confirm patient discharge plans. RN has already notified patient's son of discharge plan for today. Clinical information faxed to facility and family agreeable with plan. CSW arranged ambulance transport via [facility] to [facility]. RN to call report prior to discharge [phone]).  CSW will sign off for now as social work intervention is no longer needed. Please consult [place] again if new needs arise.  Dayton Scrape, [place]

## 2017-11-02 NOTE — Clinical Social Work Note (Signed)
CSW faxed discharge summary and unsigned FL2 to ALF to review. Have asked them to call once reviewed to confirm if any changes need to be made or if patient is okay to return.  Dayton Scrape, [place]

## 2017-11-02 NOTE — Plan of Care (Signed)
  Progressing Safety: Ability to remain free from injury will improve 11/02/2017 0617 - Progressing by Ardine Eng, RN 11/02/2017 0617 - Progressing by Ardine Eng, RN

## 2017-11-02 NOTE — Discharge Summary (Signed)
Physician Discharge Summary  [name] WSF:[id number] DOB: [birth year] DOA: 10/28/2017  PCP: [name], PA  Admit date: 10/28/2017 Discharge date: 11/02/2017  Admitted From: ALF Disposition: ALF  Recommendations for Outpatient Follow-up:  1. Follow up with PCP in 1 week 2. Please obtain BMP/CBC in one week 3. Please follow up on the following pending results: None  Home Health: PT, OT, SLP Equipment/Devices: None  Discharge Condition: Stable CODE STATUS: Full code Diet recommendation:  Dysphagia 3 (mechanical soft);Thin liquid Liquids provided via: Straw;Cup Medication Administration: Whole meds with liquid Supervision: Patient able to self feed;Full supervision/cueing for compensatory strategies Compensations: Minimize environmental distractions;Slow rate;Small sips/bites Postural Changes and/or Swallow Maneuvers: Out of bed for meals;Seated upright 90 degrees    Brief/Interim Summary:  Admission HPI written by Dyanne Carrel, NP   Chief Complaint: sob  HPI: [name] is a delightful 84 y.o. female with medical history significant for a PDA not on home oxygen, PE not on anticoagulation, dyslipidemia, hypothyroidism, mild dementia, CAD status post ICD/pacemaker, colon cancer status post colostomy, anemia, combined systolic diastolic heart failure presents to the emergency department with complaints of shortness of breath, coughing and tightness in her chest. Initial evaluation reassuring for ACS but does reveal signs of volume overload. [facility] are asked to admit for chest pain rule out  Information is obtained from the chart and the patient noting that information from patient may be unreliable due to mild dementia. She states over the last week she has gotten "more and more swollen". She reports her feet and legs feel like "pins and needles" are in them. Associated symptoms include frequent cough especially with "drinking liquid" and weight gain. She states "I  weight 208 never weighed that my life". He states she could hear herself wheezing. She states she gets "strangled" when drinking but not when eating food. She states then that "fluid backs up in my throat makes me cough". She denies chest pain pressure diaphoresis nausea vomiting. She denies orthopnea headache dizziness syncope or near-syncope. She denies diarrhea constipation dysuria hematuria frequency or urgency.  ED Course: In the emergency department she's afebrile hemodynamically stable and not hypoxic. She is provided with 325 mg of aspirin and 40 mg of Lasix intravenously.    Hospital course:  Acute on chronic systolic heart failure.  Last EF of 40-45% with moderate diffuse hypokinesis from 04/2017. Treated with IV lasix initially with over-diuresis. Restarted on home regimen prior to discharge.  Right hip pain Patient with a history of arthritis. No fracture on x-ray. Voltaren gel and physical therapy.  Aspiration of liquid Patient evaluated by speech therapy and esophagram performed which was normal. Dysphagia 3 diet recommended and continued speech follow-up.  Hypertension Home medications. Lisinopril initially held secondary to kidney injury  History of COPD Dyspnea on arrival secondary to heart failure exacerbation. Stable.  AKI on Chronic kidney disease stage III Creatinine from 1.2 to 2.39 secondary to over-diuresis. Improved to 1.34 prior to discharge.  Hypothyroidism Continue Synthroid  Anemia Stable. Outpatient follow-up.   Discharge Diagnoses:  Principal Problem:   Acute on chronic systolic CHF (congestive heart failure) (HCC) Active Problems:   COPD (chronic obstructive pulmonary disease) (HCC)   CKD (chronic kidney disease) stage 3, GFR 30-59 ml/min (HCC)   Benign essential HTN   NICM (nonischemic cardiomyopathy) (HCC)   Hypothyroidism   Chest pain   Aspiration of liquid    Discharge Instructions  Discharge Instructions    Call MD for:   difficulty breathing, headache  or visual disturbances   Complete by:  As directed    Call MD for:  extreme fatigue   Complete by:  As directed    Call MD for:  persistant dizziness or light-headedness   Complete by:  As directed    Diet - low sodium heart healthy   Complete by:  As directed    Increase activity slowly   Complete by:  As directed      Allergies as of 11/02/2017   No Known Allergies     Medication List    TAKE these medications   acetaminophen 500 MG tablet Commonly known as:  TYLENOL Take 1,000 mg by mouth 2 (two) times daily. What changed:  Another medication with the same name was removed. Continue taking this medication, and follow the directions you see here.   albuterol (2.5 MG/3ML) 0.083% nebulizer solution Commonly known as:  PROVENTIL Take 3 mLs (2.5 mg total) by nebulization every 4 (four) hours as needed for wheezing.   aspirin EC 81 MG tablet Take 1 tablet (81 mg total) by mouth every morning.   atorvastatin 20 MG tablet Commonly known as:  LIPITOR Take 20 mg by mouth daily.   benzonatate 100 MG capsule Commonly known as:  TESSALON Take 1 capsule (100 mg total) by mouth 3 (three) times daily as needed for cough.   carvedilol 3.125 MG tablet Commonly known as:  COREG Take 2 tablets (6.25 mg total) by mouth 2 (two) times daily with a meal.   diclofenac sodium 1 % Gel Commonly known as:  VOLTAREN Apply 2 g topically 4 (four) times daily.   FERREX 150 150 MG capsule Generic drug:  iron polysaccharides Take 150 mg by mouth 2 (two) times daily.   FLORANEX PO Take 1 tablet by mouth 3 (three) times daily with meals.   Fluticasone-Salmeterol 500-50 MCG/DOSE Aepb Commonly known as:  ADVAIR Inhale 1 puff into the lungs 2 (two) times daily. Rinse mouth after use   furosemide 40 MG tablet Commonly known as:  LASIX Take 1 tablet (40 mg total) by mouth daily.   hydrALAZINE 25 MG tablet Commonly known as:  APRESOLINE Take 0.5 tablets (12.5 mg  total) by mouth 3 (three) times daily.   isosorbide mononitrate 30 MG 24 hr tablet Commonly known as:  IMDUR Take 1 tablet (30 mg total) by mouth daily.   levothyroxine 50 MCG tablet Commonly known as:  SYNTHROID, LEVOTHROID Take 50 mcg by mouth daily.   lisinopril 10 MG tablet Commonly known as:  PRINIVIL,ZESTRIL Take 1 tablet (10 mg total) by mouth daily. What changed:  how much to take   loratadine 10 MG tablet Commonly known as:  CLARITIN Take 10 mg by mouth daily.   oxymetazoline 0.05 % nasal spray Commonly known as:  AFRIN Place 1 spray into both nostrils 2 (two) times daily as needed (for nose bleeds). Reported on 12/23/2015   potassium chloride 10 MEQ tablet Commonly known as:  K-DUR Take 1 tablet (10 mEq total) by mouth daily.   ranitidine 150 MG tablet Commonly known as:  ZANTAC Take 150 mg by mouth daily.   REFRESH 1 % ophthalmic solution Generic drug:  carboxymethylcellulose Place 1 drop into both eyes 3 (three) times daily.   tamsulosin 0.4 MG Caps capsule Commonly known as:  FLOMAX Take 1 capsule (0.4 mg total) by mouth daily after supper.       No Known Allergies  Consultations:  None   Procedures/Studies: Dg Chest 2 View  Result Date: 10/30/2017 CLINICAL DATA:  Cough EXAM: CHEST  2 VIEW COMPARISON:  10/28/2017 FINDINGS: Cardiac shadow is stable. Defibrillator is again noted and stable. Poor inspiratory effort is noted. The right lung remains clear. Persistent infiltrative changes in the left base are noted. Degenerative changes of the thoracic spine are noted. IMPRESSION: Mild left basilar infiltrate stable from the prior exam Electronically Signed   By: Inez Catalina M.D.   On: 10/30/2017 14:32   Dg Chest 2 View  Result Date: 10/28/2017 CLINICAL DATA:  84 year old female with shortness of breath. EXAM: CHEST  2 VIEW COMPARISON:  Chest radiograph dated 04/28/2017 FINDINGS: There is shallow inspiration. An area of increased density at the left lung  base similar to prior radiograph likely combination of cardiomegaly and atelectatic changes. Underlying pneumonia is not excluded. Clinical correlation is recommended. The right lung is clear. No pneumothorax. Left pectoral AICD device. No acute osseous pathology. IMPRESSION: 1. Shallow inspiration. Left lung base. Infiltrate is not excluded. Clinical correlation is recommended. 2. Stable cardiomegaly.  No vascular congestion. Electronically Signed   By: Anner Crete M.D.   On: 10/28/2017 04:56   Dg Esophagus  Result Date: 10/30/2017 CLINICAL DATA:  Coughing with liquids. Intermittent sensation of solids sticking in her throat. EXAM: ESOPHOGRAM/BARIUM SWALLOW TECHNIQUE: Single contrast examination was performed using  thin barium. FLUOROSCOPY TIME:  Fluoroscopy Time:  1 minute, 42 seconds. Radiation Exposure Index (if provided by the fluoroscopic device): 16.8 mGy. Number of Acquired Spot Images: 0 COMPARISON:  CT chest dated April 22, 2017. FINDINGS: Primary peristaltic waves in the esophagus were normal. No obstruction to the forward flow of contrast throughout the esophagus and into the stomach. Normal esophageal course and contour. Normal esophageal mucosal pattern. No esophageal stricture, ulceration, or other significant abnormality. A 13 mm barium tablet passed without difficulty into the stomach. IMPRESSION: 1. Normal esophagram. Electronically Signed   By: Titus Dubin M.D.   On: 10/30/2017 15:57   Dg Hip Unilat With Pelvis 2-3 Views Right  Result Date: 11/01/2017 CLINICAL DATA:  Right hip pain with no known injury. History of arthritis, colonic malignancy, and diabetes. EXAM: DG HIP (WITH OR WITHOUT PELVIS) 2-3V RIGHT COMPARISON:  Right hip series of April 28, 2017 FINDINGS: The bony pelvis is subjectively adequately mineralized. There is no lytic nor blastic lesion nor acute or old fracture. AP and lateral views of the right hip reveal mild symmetric narrowing of the joint space. The  articular surfaces of the right femoral head and acetabulum remains smoothly rounded. The femoral neck, intertrochanteric, and subtrochanteric regions are normal. IMPRESSION: There is no acute bony abnormality of the right hip. Mild symmetric osteoarthritic joint space loss. Electronically Signed   By: David  Martinique M.D.   On: 11/01/2017 14:58      Subjective: No chest pain or dyspnea. Hip pain manageable.  Discharge Exam: Vitals:   11/02/17 0721 11/02/17 0908  BP:  (!) 129/50  Pulse:  85  Resp:    Temp:    SpO2: 99% 100%   Vitals:   11/01/17 2032 11/02/17 0459 11/02/17 0721 11/02/17 0908  BP: (!) 160/55 (!) 145/75  (!) 129/50  Pulse: 87 75  85  Resp: 18 18    Temp: 100 F (37.8 C) 98.5 F (36.9 C)    TempSrc: Oral Oral    SpO2: 100% 99% 99% 100%  Weight:  90.2 kg (198 lb 12.8 oz)    Height:        General: Pt is alert,  awake, not in acute distress Cardiovascular: RRR, S1/S2 +, no rubs, no gallops Respiratory: CTA bilaterally, no wheezing, no rhonchi Abdominal: Soft, NT, ND, bowel sounds +    The results of significant diagnostics from this hospitalization (including imaging, microbiology, ancillary and laboratory) are listed below for reference.     Microbiology: Recent Results (from the past 240 hour(s))  MRSA PCR Screening     Status: None   Collection Time: 10/28/17  4:42 PM  Result Value Ref Range Status   MRSA by PCR NEGATIVE NEGATIVE Final    Comment:        The GeneXpert MRSA Assay (FDA approved for NASAL specimens only), is one component of a comprehensive MRSA colonization surveillance program. It is not intended to diagnose MRSA infection nor to guide or monitor treatment for MRSA infections.      Labs: BNP (last 3 results) Recent Labs    04/22/17 0059 10/28/17 0423  BNP 286.1* 174.0*   Basic Metabolic Panel: Recent Labs  Lab 10/28/17 0445 10/29/17 0214 10/30/17 0502 10/31/17 0548 11/01/17 0801  NA 139 137 140 141 141  K 4.3 4.2  4.6 4.5 4.6  CL 105 102 104 107 108  CO2 23 24 23 22 24   GLUCOSE 79 76 82 73 82  BUN 26* 31* 50* 52* 36*  CREATININE 1.21* 1.75* 2.39* 2.20* 1.34*  CALCIUM 9.5 9.2 9.1 8.7* 9.0   Liver Function Tests: Recent Labs  Lab 10/30/17 0502  AST 18  ALT 11*  ALKPHOS 73  BILITOT 0.5  PROT 6.6  ALBUMIN 3.5   No results for input(s): LIPASE, AMYLASE in the last 168 hours. No results for input(s): AMMONIA in the last 168 hours. CBC: Recent Labs  Lab 10/28/17 0445 10/29/17 0214 10/30/17 0502 10/31/17 0902 11/01/17 0801  WBC 4.6 5.1 5.2 5.5 5.4  NEUTROABS 2.4  --   --   --   --   HGB 9.4* 8.6* 9.2* 8.4* 8.6*  HCT 30.5* 27.8* 29.5* 28.1* 28.6*  MCV 88.9 89.4 90.2 90.6 90.8  PLT 231 213 228 195 203   Cardiac Enzymes: Recent Labs  Lab 10/28/17 0613 10/28/17 2029 10/29/17 0214  TROPONINI <0.03 <0.03 <0.03   BNP: Invalid input(s): POCBNP CBG: No results for input(s): GLUCAP in the last 168 hours. D-Dimer No results for input(s): DDIMER in the last 72 hours. Hgb A1c No results for input(s): HGBA1C in the last 72 hours. Lipid Profile No results for input(s): CHOL, HDL, LDLCALC, TRIG, CHOLHDL, LDLDIRECT in the last 72 hours. Thyroid function studies No results for input(s): TSH, T4TOTAL, T3FREE, THYROIDAB in the last 72 hours.  Invalid input(s): FREET3 Anemia work up No results for input(s): VITAMINB12, FOLATE, FERRITIN, TIBC, IRON, RETICCTPCT in the last 72 hours. Urinalysis    Component Value Date/Time   COLORURINE YELLOW 10/28/2017 [place] 10/28/2017 0539   LABSPEC 1.010 10/28/2017 0539   PHURINE 7.0 10/28/2017 0539   GLUCOSEU NEGATIVE 10/28/2017 0539   HGBUR NEGATIVE 10/28/2017 0539   BILIRUBINUR NEGATIVE 10/28/2017 0539   KETONESUR NEGATIVE 10/28/2017 0539   PROTEINUR NEGATIVE 10/28/2017 0539   UROBILINOGEN 0.2 07/23/2015 1610   NITRITE NEGATIVE 10/28/2017 0539   LEUKOCYTESUR NEGATIVE 10/28/2017 0539   Sepsis Labs Invalid input(s):  PROCALCITONIN,  WBC,  LACTICIDVEN Microbiology Recent Results (from the past 240 hour(s))  MRSA PCR Screening     Status: None   Collection Time: 10/28/17  4:42 PM  Result Value Ref Range Status   MRSA by PCR NEGATIVE  NEGATIVE Final    Comment:        The GeneXpert MRSA Assay (FDA approved for NASAL specimens only), is one component of a comprehensive MRSA colonization surveillance program. It is not intended to diagnose MRSA infection nor to guide or monitor treatment for MRSA infections.      Time coordinating discharge: Over 30 minutes  SIGNED:   Cordelia Poche, MD [facility] 11/02/2017, 10:58 AM Pager [phone]  If 7PM-7AM, please contact night-coverage [email or web address] Password [email or web address]

## 2017-11-02 NOTE — Plan of Care (Signed)
  Progressing Safety: Ability to remain free from injury will improve 11/02/2017 0617 - Progressing by Ardine Eng, RN

## 2017-11-02 NOTE — Progress Notes (Signed)
Physical Therapy Treatment Patient Details Name: [name] MRN: [id number] DOB: [birth year] Today's Date: 11/02/2017    History of Present Illness Pt is a 84 y.o. female admitted from SNF on 10/28/17 for CHF exacerbation. PMH includes mild dementia, CAD (s/p ICD/pacemaker), HF, colon CA s/p colostomy, CKD III, COPD, HTN. Of note, admitted 6 months ago for worsening dyspnea and potential PNA.   PT Comments    Pt progressing with mobility. Requires min-modA to stand with RW, but only min guard for ambulating in room with RW; ambulation distance limited secondary to c/o fatigue and hip pain. According to pt, she receives PT/OT/SLP and 24/7 assist from techs at current ALF. If this is the case, feel she is safe to return to ALF with continued rehab and 24/7 assist/supervision. If not, pt will require SNF-level therapies at d/c. Will follow acutely.   Follow Up Recommendations  SNF;Supervision for mobility/OOB     Equipment Recommendations  None recommended by PT    Recommendations for Other Services       Precautions / Restrictions Precautions Precautions: Fall;Other (comment) Precaution Comments: chronic R hip pain Restrictions Weight Bearing Restrictions: No    Mobility  Bed Mobility Overal bed mobility: Needs Assistance Bed Mobility: Supine to Sit     Supine to sit: Min guard     General bed mobility comments: Increased time and effort secondary to pain and stiffness; reliant on RW for BUE support to pull hips towards EOB  Transfers Overall transfer level: Needs assistance Equipment used: Rolling walker (2 wheeled) Transfers: Sit to/from Stand Sit to Stand: Mod assist;Min assist         General transfer comment: Initial modA to assist trunk elevation with cues for hand placement on RW. Pt able to practice standing 3x more trials, only requiring minA for trunk elevation and balance. Cues for proper sequencing throughout  Ambulation/Gait Ambulation/Gait assistance:  Min guard Ambulation Distance (Feet): 20 Feet Assistive device: Rolling walker (2 wheeled) Gait Pattern/deviations: Step-to pattern;Antalgic Gait velocity: Decreased Gait velocity interpretation: <1.8 ft/sec, indicative of risk for recurrent falls General Gait Details: Slow, antalgic amb in room with 1x seated rest break secondary to pain and fatigue. Increased time and effort, but no physical assist required. Close min guard for balance   Stairs            Wheelchair Mobility    Modified Rankin (Stroke Patients Only)       Balance Overall balance assessment: Needs assistance   Sitting balance-Leahy Scale: Fair       Standing balance-Leahy Scale: Poor Standing balance comment: Able to brush hair at sink with single UE support                            Cognition Arousal/Alertness: Awake/alert Behavior During Therapy: WFL for tasks assessed/performed Overall Cognitive Status: History of cognitive impairments - at baseline                                 General Comments: H/o mild dementia; alert and oriented throughout session      Exercises      General Comments        Pertinent Vitals/Pain Pain Assessment: Faces Faces Pain Scale: Hurts even more Pain Location: R hip > R knee Pain Descriptors / Indicators: Aching;Constant Pain Intervention(s): Monitored during session;Limited activity within patient's tolerance    Home Living  Prior Function            PT Goals (current goals can now be found in the care plan section) Acute Rehab PT Goals Patient Stated Goal: Continue rehab at ALF PT Goal Formulation: With patient Time For Goal Achievement: 11/13/17 Potential to Achieve Goals: Good Progress towards PT goals: Progressing toward goals    Frequency    Min 2X/week      PT Plan Current plan remains appropriate    Co-evaluation              AM-PAC PT "6 Clicks" Daily Activity   Outcome Measure  Difficulty turning over in bed (including adjusting bedclothes, sheets and blankets)?: A Little Difficulty moving from lying on back to sitting on the side of the bed? : A Little Difficulty sitting down on and standing up from a chair with arms (e.g., wheelchair, bedside commode, etc,.)?: Unable Help needed moving to and from a bed to chair (including a wheelchair)?: A Little Help needed walking in hospital room?: A Little Help needed climbing 3-5 steps with a railing? : A Lot 6 Click Score: 15    End of Session Equipment Utilized During Treatment: Gait belt Activity Tolerance: Patient tolerated treatment well;Patient limited by pain Patient left: in chair;with call bell/phone within reach Nurse Communication: Mobility status PT Visit Diagnosis: Other abnormalities of gait and mobility (R26.89)     Time: 8921-1941 PT Time Calculation (min) (ACUTE ONLY): 25 min  Charges:  $Gait Training: 8-22 mins $Therapeutic Activity: 8-22 mins                    G Codes:      Mabeline Caras, PT, DPT Acute Rehab Services  Pager: [place] 11/02/2017, 9:46 AM

## 2017-11-06 ENCOUNTER — Other Ambulatory Visit (HOSPITAL_COMMUNITY): Payer: Medicare HMO

## 2017-11-06 ENCOUNTER — Ambulatory Visit (HOSPITAL_COMMUNITY): Payer: Medicare HMO

## 2017-11-28 ENCOUNTER — Emergency Department (HOSPITAL_COMMUNITY): Payer: Medicare HMO

## 2017-11-28 ENCOUNTER — Encounter (HOSPITAL_COMMUNITY): Payer: Self-pay

## 2017-11-28 ENCOUNTER — Other Ambulatory Visit: Payer: Self-pay

## 2017-11-28 ENCOUNTER — Inpatient Hospital Stay (HOSPITAL_COMMUNITY)
Admission: EM | Admit: 2017-11-28 | Discharge: 2017-12-01 | DRG: 177 | Disposition: A | Discharge status: Home Health | Payer: Medicare HMO | Attending: Internal Medicine | Admitting: Internal Medicine

## 2017-11-28 DIAGNOSIS — J690 Pneumonitis due to inhalation of food and vomit: Principal | ICD-10-CM | POA: Diagnosis present

## 2017-11-28 DIAGNOSIS — R0600 Dyspnea, unspecified: Secondary | ICD-10-CM

## 2017-11-28 DIAGNOSIS — Z85038 Personal history of other malignant neoplasm of large intestine: Secondary | ICD-10-CM

## 2017-11-28 DIAGNOSIS — E039 Hypothyroidism, unspecified: Secondary | ICD-10-CM | POA: Diagnosis present

## 2017-11-28 DIAGNOSIS — I5023 Acute on chronic systolic (congestive) heart failure: Secondary | ICD-10-CM | POA: Diagnosis present

## 2017-11-28 DIAGNOSIS — N183 Chronic kidney disease, stage 3 unspecified: Secondary | ICD-10-CM | POA: Diagnosis present

## 2017-11-28 DIAGNOSIS — Z9114 Patient's other noncompliance with medication regimen: Secondary | ICD-10-CM | POA: Diagnosis not present

## 2017-11-28 DIAGNOSIS — E785 Hyperlipidemia, unspecified: Secondary | ICD-10-CM | POA: Diagnosis present

## 2017-11-28 DIAGNOSIS — I2510 Atherosclerotic heart disease of native coronary artery without angina pectoris: Secondary | ICD-10-CM | POA: Diagnosis present

## 2017-11-28 DIAGNOSIS — R0603 Acute respiratory distress: Secondary | ICD-10-CM

## 2017-11-28 DIAGNOSIS — I10 Essential (primary) hypertension: Secondary | ICD-10-CM | POA: Diagnosis present

## 2017-11-28 DIAGNOSIS — Z7951 Long term (current) use of inhaled steroids: Secondary | ICD-10-CM | POA: Diagnosis not present

## 2017-11-28 DIAGNOSIS — Z79899 Other long term (current) drug therapy: Secondary | ICD-10-CM | POA: Diagnosis not present

## 2017-11-28 DIAGNOSIS — J449 Chronic obstructive pulmonary disease, unspecified: Secondary | ICD-10-CM | POA: Diagnosis present

## 2017-11-28 DIAGNOSIS — I130 Hypertensive heart and chronic kidney disease with heart failure and stage 1 through stage 4 chronic kidney disease, or unspecified chronic kidney disease: Secondary | ICD-10-CM | POA: Diagnosis present

## 2017-11-28 DIAGNOSIS — Z95810 Presence of automatic (implantable) cardiac defibrillator: Secondary | ICD-10-CM

## 2017-11-28 DIAGNOSIS — Z7982 Long term (current) use of aspirin: Secondary | ICD-10-CM | POA: Diagnosis not present

## 2017-11-28 DIAGNOSIS — Z933 Colostomy status: Secondary | ICD-10-CM

## 2017-11-28 DIAGNOSIS — M1990 Unspecified osteoarthritis, unspecified site: Secondary | ICD-10-CM | POA: Diagnosis present

## 2017-11-28 DIAGNOSIS — E1122 Type 2 diabetes mellitus with diabetic chronic kidney disease: Secondary | ICD-10-CM | POA: Diagnosis present

## 2017-11-28 DIAGNOSIS — Z86711 Personal history of pulmonary embolism: Secondary | ICD-10-CM

## 2017-11-28 DIAGNOSIS — J189 Pneumonia, unspecified organism: Secondary | ICD-10-CM

## 2017-11-28 DIAGNOSIS — I5022 Chronic systolic (congestive) heart failure: Secondary | ICD-10-CM | POA: Diagnosis not present

## 2017-11-28 LAB — CBC WITH DIFFERENTIAL/PLATELET
Basophils Absolute: 0 10*3/uL (ref 0.0–0.1)
Basophils Relative: 0 %
EOS ABS: 0.1 10*3/uL (ref 0.0–0.7)
Eosinophils Relative: 1 %
HEMATOCRIT: 29.9 % — AB (ref 36.0–46.0)
HEMOGLOBIN: 9.2 g/dL — AB (ref 12.0–15.0)
Lymphocytes Relative: 31 %
Lymphs Abs: 1.6 10*3/uL (ref 0.7–4.0)
MCH: 28 pg (ref 26.0–34.0)
MCHC: 30.8 g/dL (ref 30.0–36.0)
MCV: 91.2 fL (ref 78.0–100.0)
Monocytes Absolute: 0.7 10*3/uL (ref 0.1–1.0)
Monocytes Relative: 14 %
NEUTROS ABS: 2.8 10*3/uL (ref 1.7–7.7)
NEUTROS PCT: 54 %
Platelets: 224 10*3/uL (ref 150–400)
RBC: 3.28 MIL/uL — AB (ref 3.87–5.11)
RDW: 15.4 % (ref 11.5–15.5)
WBC: 5.2 10*3/uL (ref 4.0–10.5)

## 2017-11-28 LAB — COMPREHENSIVE METABOLIC PANEL
ALBUMIN: 4.2 g/dL (ref 3.5–5.0)
ALK PHOS: 79 U/L (ref 38–126)
ALT: 40 U/L (ref 14–54)
ANION GAP: 10 (ref 5–15)
AST: 45 U/L — ABNORMAL HIGH (ref 15–41)
BUN: 30 mg/dL — ABNORMAL HIGH (ref 6–20)
CALCIUM: 9.4 mg/dL (ref 8.9–10.3)
CHLORIDE: 101 mmol/L (ref 101–111)
CO2: 26 mmol/L (ref 22–32)
Creatinine, Ser: 1.38 mg/dL — ABNORMAL HIGH (ref 0.44–1.00)
GFR calc non Af Amer: 35 mL/min — ABNORMAL LOW (ref >60)
GFR, EST AFRICAN AMERICAN: 40 mL/min — AB (ref >60)
Glucose, Bld: 86 mg/dL (ref 65–99)
POTASSIUM: 4.4 mmol/L (ref 3.5–5.1)
SODIUM: 137 mmol/L (ref 135–145)
Total Bilirubin: 0.3 mg/dL (ref 0.3–1.2)
Total Protein: 8 g/dL (ref 6.5–8.1)

## 2017-11-28 LAB — MRSA PCR SCREENING: MRSA BY PCR: NEGATIVE

## 2017-11-28 LAB — I-STAT CG4 LACTIC ACID, ED: LACTIC ACID, VENOUS: 0.77 mmol/L (ref 0.5–1.9)

## 2017-11-28 LAB — I-STAT TROPONIN, ED: TROPONIN I, POC: 0.01 ng/mL (ref 0.00–0.08)

## 2017-11-28 LAB — INFLUENZA PANEL BY PCR (TYPE A & B)
INFLBPCR: NEGATIVE
Influenza A By PCR: NEGATIVE

## 2017-11-28 LAB — TROPONIN I: TROPONIN I: 0.03 ng/mL — AB (ref <0.03)

## 2017-11-28 LAB — BRAIN NATRIURETIC PEPTIDE: B Natriuretic Peptide: 179.3 pg/mL — ABNORMAL HIGH (ref 0.0–100.0)

## 2017-11-28 MED ORDER — SODIUM CHLORIDE 0.9% FLUSH: 3.0000 mL | INTRAVENOUS | Status: DC | PRN

## 2017-11-28 MED ORDER — ONDANSETRON HCL 4 MG PO TABS: 4.0000 mg | ORAL_TABLET | Freq: Four times a day (QID) | ORAL | Status: DC | PRN

## 2017-11-28 MED ORDER — SODIUM CHLORIDE 0.9 % IV SOLN
2.0000 g | Freq: Once | INTRAVENOUS | Status: AC
  Administered 2017-11-28: 2 g via INTRAVENOUS
  Filled 2017-11-28: qty 2

## 2017-11-28 MED ORDER — METHYLPREDNISOLONE SODIUM SUCC 40 MG IJ SOLR
40.0000 mg | INTRAMUSCULAR | Status: DC
  Administered 2017-11-28: 40 mg via INTRAVENOUS
  Filled 2017-11-28: qty 1

## 2017-11-28 MED ORDER — IPRATROPIUM BROMIDE 0.02 % IN SOLN: 0.5000 mg | Freq: Four times a day (QID) | RESPIRATORY_TRACT | Status: DC

## 2017-11-28 MED ORDER — POTASSIUM CHLORIDE ER 10 MEQ PO TBCR
10.0000 meq | EXTENDED_RELEASE_TABLET | Freq: Every day | ORAL | Status: DC
  Administered 2017-11-29 – 2017-12-01 (×3): 10 meq via ORAL
  Filled 2017-11-28 (×6): qty 1

## 2017-11-28 MED ORDER — CARVEDILOL 6.25 MG PO TABS
6.2500 mg | ORAL_TABLET | Freq: Two times a day (BID) | ORAL | Status: DC
  Administered 2017-11-29 – 2017-12-01 (×6): 6.25 mg via ORAL
  Filled 2017-11-28 (×6): qty 1

## 2017-11-28 MED ORDER — MOMETASONE FURO-FORMOTEROL FUM 200-5 MCG/ACT IN AERO
2.0000 | INHALATION_SPRAY | Freq: Two times a day (BID) | RESPIRATORY_TRACT | Status: DC
  Administered 2017-11-29 – 2017-12-01 (×5): 2 via RESPIRATORY_TRACT
  Filled 2017-11-28: qty 8.8

## 2017-11-28 MED ORDER — LEVOTHYROXINE SODIUM 50 MCG PO TABS
50.0000 ug | ORAL_TABLET | Freq: Every day | ORAL | Status: DC
Start: 2017-11-29 — End: 2017-12-01
  Administered 2017-11-29 – 2017-12-01 (×3): 50 ug via ORAL
  Filled 2017-11-28 (×3): qty 1

## 2017-11-28 MED ORDER — SENNOSIDES-DOCUSATE SODIUM 8.6-50 MG PO TABS: 1.0000 | ORAL_TABLET | Freq: Every evening | ORAL | Status: DC | PRN

## 2017-11-28 MED ORDER — IPRATROPIUM-ALBUTEROL 0.5-2.5 (3) MG/3ML IN SOLN
RESPIRATORY_TRACT | Status: AC
  Filled 2017-11-28: qty 3

## 2017-11-28 MED ORDER — LORATADINE 10 MG PO TABS
10.0000 mg | ORAL_TABLET | Freq: Every day | ORAL | Status: DC
  Administered 2017-11-29 – 2017-12-01 (×3): 10 mg via ORAL
  Filled 2017-11-28 (×3): qty 1

## 2017-11-28 MED ORDER — ALBUTEROL SULFATE (2.5 MG/3ML) 0.083% IN NEBU: 2.5000 mg | INHALATION_SOLUTION | Freq: Four times a day (QID) | RESPIRATORY_TRACT | Status: DC

## 2017-11-28 MED ORDER — ACETAMINOPHEN 650 MG RE SUPP: 650.0000 mg | Freq: Four times a day (QID) | RECTAL | Status: DC | PRN

## 2017-11-28 MED ORDER — SODIUM CHLORIDE 0.9 % IV SOLN: 250.0000 mL | INTRAVENOUS | Status: DC | PRN

## 2017-11-28 MED ORDER — SODIUM CHLORIDE 0.9% FLUSH
3.0000 mL | Freq: Two times a day (BID) | INTRAVENOUS | Status: DC
  Administered 2017-11-28 – 2017-12-01 (×6): 3 mL via INTRAVENOUS

## 2017-11-28 MED ORDER — BENZONATATE 100 MG PO CAPS
100.0000 mg | ORAL_CAPSULE | Freq: Three times a day (TID) | ORAL | Status: DC | PRN
  Administered 2017-11-28 – 2017-11-29 (×2): 100 mg via ORAL
  Filled 2017-11-28 (×2): qty 1

## 2017-11-28 MED ORDER — POLYVINYL ALCOHOL 1.4 % OP SOLN
1.0000 [drp] | Freq: Three times a day (TID) | OPHTHALMIC | Status: DC
  Administered 2017-11-28 – 2017-12-01 (×8): 1 [drp] via OPHTHALMIC
  Filled 2017-11-28: qty 15

## 2017-11-28 MED ORDER — ASPIRIN EC 81 MG PO TBEC
81.0000 mg | DELAYED_RELEASE_TABLET | Freq: Every day | ORAL | Status: DC
  Administered 2017-11-29 – 2017-12-01 (×3): 81 mg via ORAL
  Filled 2017-11-28 (×3): qty 1

## 2017-11-28 MED ORDER — ACETAMINOPHEN 325 MG PO TABS: 650.0000 mg | ORAL_TABLET | Freq: Four times a day (QID) | ORAL | Status: DC | PRN

## 2017-11-28 MED ORDER — ONDANSETRON HCL 4 MG/2ML IJ SOLN: 4.0000 mg | Freq: Four times a day (QID) | INTRAMUSCULAR | Status: DC | PRN

## 2017-11-28 MED ORDER — POLYSACCHARIDE IRON COMPLEX 150 MG PO CAPS
150.0000 mg | ORAL_CAPSULE | Freq: Two times a day (BID) | ORAL | Status: DC
Start: 2017-11-28 — End: 2017-12-01
  Administered 2017-11-28 – 2017-12-01 (×6): 150 mg via ORAL
  Filled 2017-11-28 (×6): qty 1

## 2017-11-28 MED ORDER — ATORVASTATIN CALCIUM 20 MG PO TABS
20.0000 mg | ORAL_TABLET | Freq: Every day | ORAL | Status: DC
  Administered 2017-11-29 – 2017-12-01 (×3): 20 mg via ORAL
  Filled 2017-11-28 (×3): qty 1

## 2017-11-28 MED ORDER — OSELTAMIVIR PHOSPHATE 30 MG PO CAPS
30.0000 mg | ORAL_CAPSULE | Freq: Two times a day (BID) | ORAL | Status: DC
  Administered 2017-11-28: 30 mg via ORAL
  Filled 2017-11-28 (×2): qty 1

## 2017-11-28 MED ORDER — ENOXAPARIN SODIUM 40 MG/0.4ML ~~LOC~~ SOLN
40.0000 mg | SUBCUTANEOUS | Status: DC
  Administered 2017-11-28: 40 mg via SUBCUTANEOUS
  Filled 2017-11-28: qty 0.4

## 2017-11-28 MED ORDER — ALBUTEROL SULFATE (2.5 MG/3ML) 0.083% IN NEBU: 2.5000 mg | INHALATION_SOLUTION | RESPIRATORY_TRACT | Status: DC | PRN

## 2017-11-28 MED ORDER — ISOSORBIDE MONONITRATE ER 30 MG PO TB24
30.0000 mg | ORAL_TABLET | Freq: Every day | ORAL | Status: DC
  Administered 2017-11-29 – 2017-12-01 (×3): 30 mg via ORAL
  Filled 2017-11-28 (×3): qty 1

## 2017-11-28 MED ORDER — SODIUM CHLORIDE 0.9 % IV SOLN
3.0000 g | Freq: Four times a day (QID) | INTRAVENOUS | Status: DC
  Administered 2017-11-28 – 2017-12-01 (×12): 3 g via INTRAVENOUS
  Filled 2017-11-28 (×14): qty 3

## 2017-11-28 MED ORDER — IPRATROPIUM-ALBUTEROL 0.5-2.5 (3) MG/3ML IN SOLN
3.0000 mL | Freq: Once | RESPIRATORY_TRACT | Status: AC
  Administered 2017-11-28: 3 mL via RESPIRATORY_TRACT
  Filled 2017-11-28: qty 3

## 2017-11-28 MED ORDER — VANCOMYCIN HCL IN DEXTROSE 1-5 GM/200ML-% IV SOLN
1000.0000 mg | Freq: Once | INTRAVENOUS | Status: DC
  Filled 2017-11-28 (×2): qty 200

## 2017-11-28 MED ORDER — OXYMETAZOLINE HCL 0.05 % NA SOLN
1.0000 | Freq: Two times a day (BID) | NASAL | Status: DC | PRN
Start: 2017-11-28 — End: 2017-12-01
  Filled 2017-11-28: qty 15

## 2017-11-28 MED ORDER — FUROSEMIDE 10 MG/ML IJ SOLN
40.0000 mg | Freq: Every day | INTRAMUSCULAR | Status: DC
  Administered 2017-11-28 – 2017-12-01 (×4): 40 mg via INTRAVENOUS
  Filled 2017-11-28 (×4): qty 4

## 2017-11-28 MED ORDER — FAMOTIDINE 20 MG PO TABS
10.0000 mg | ORAL_TABLET | Freq: Every day | ORAL | Status: DC
  Administered 2017-11-29 – 2017-12-01 (×3): 10 mg via ORAL
  Filled 2017-11-28 (×3): qty 1

## 2017-11-28 MED ORDER — IPRATROPIUM-ALBUTEROL 0.5-2.5 (3) MG/3ML IN SOLN
3.0000 mL | Freq: Four times a day (QID) | RESPIRATORY_TRACT | Status: DC
Start: 2017-11-28 — End: 2017-11-29
  Administered 2017-11-28 – 2017-11-29 (×5): 3 mL via RESPIRATORY_TRACT
  Filled 2017-11-28 (×4): qty 3

## 2017-11-28 MED ORDER — SALINE SPRAY 0.65 % NA SOLN
1.0000 | NASAL | Status: DC | PRN
  Filled 2017-11-28: qty 44

## 2017-11-28 NOTE — ED Notes (Signed)
Bed: WA20 Expected date:  Expected time:  Means of arrival:  Comments: EMS SNF/ congestion

## 2017-11-28 NOTE — Progress Notes (Addendum)
Pharmacy Antibiotic Note  Shelby ACHEY is a 84 y.o. female admitted on 11/28/2017 with pneumonia.  Pharmacy has been consulted for unasyn dosing.  Plan: Unasyn 3gm IV q6h Renally adjust tamiflu to 30mg  BID - monitor renal function and need to adjust dose  Height: 5\' 6"  (167.6 cm) Weight: 209 lb (94.8 kg) IBW/kg (Calculated) : 59.3  Temp (24hrs), Avg:99.1 F (37.3 C), Min:98.9 F (37.2 C), Max:99.3 F (37.4 C)  Recent Labs  Lab 11/28/17 1622 11/28/17 1900  WBC 5.2  --   CREATININE 1.38*  --   LATICACIDVEN  --  0.77    Estimated Creatinine Clearance: 37.1 mL/min (A) (by C-G formula based on SCr of 1.38 mg/dL (H)).    No Known Allergies  Antimicrobials this admission: 2/20 vanco x 1 2/20 cefepime x 1  2/20 unasyn >>   Dose adjustments this admission:  Microbiology results: 2/20 BCx:  2/20 S. pneumo Ag 2/20 influenza PCR  Sputum: ordered   Thank you for allowing pharmacy to be a part of this patient's care.  Doreene Eland, PharmD, BCPS.   Pager: [phone] 11/28/2017 7:23 PM

## 2017-11-28 NOTE — ED Notes (Signed)
This RN attempted to use the butterfly needle to obtain blood cultures and I-stat lactic unsuccessfully. Charge nurse will attempt to gain second IV access and obtain blood for cultures and I-stat lactic. This RN will begin antibiotics once these have been obtained.

## 2017-11-28 NOTE — ED Provider Notes (Signed)
Emergency Department Provider Note   I have reviewed the triage vital signs and the nursing notes.   HISTORY  Chief Complaint Cough and Nasal Congestion   HPI [name] is a 84 y.o. female with PMH of CHF (EF 40-45%), COPD, DM, HLD, HTN, and colostomy presents to the emergency department for evaluation of difficulty breathing.  Symptoms have been progressively worsening over the past 2 days.  She has had some cough and nasal congestion but denies fever or body aches.  No known sick contacts.  Patient states that she has been compliant with her Lasix 40 mg daily.  She weighs herself daily and states that she has had unintentional weight gain of 9 pounds over the last 2 days, which she presumes is fluid.  She has noticed increasing swelling in her legs.  Denies chest pain.  Shortness of breath is worse with laying flat and worse with exertion.  No other modifying factors.  No radiation of symptoms.    Past Medical History:  Diagnosis Date  . Anginal pain ([place])   . Arthritis    "comes and goes" (03/01/2015)  . Asthma   . CHF (congestive heart failure) ([place])   . Colon cancer ([place])   . Colostomy care ([place])   . COPD (chronic obstructive pulmonary disease) ([place])   . Diabetes mellitus without complication ([place])    pt denies this hx on 03/01/2015  . Heart murmur   . History of blood transfusion 03/01/2015; 04/23/2017   "related to anemia,"  . Hyperlipidemia   . Hypertension   . Hypothyroidism   . Pneumonia    "just once" (03/01/2015)  . Pneumonia 04/23/2017  . Presence of combination internal cardiac defibrillator (ICD) and pacemaker    2005 in [facility]  . Pulmonary embolism ([place])   . Renal disorder     Patient Active Problem List   Diagnosis Date Noted  . Chest pain 10/28/2017  . Aspiration of liquid 10/28/2017  . Bilateral lower extremity edema   . Pneumonia 04/22/2017  . HCAP (healthcare-associated pneumonia) 04/22/2017  . Altered mental status   . Hypotension 02/16/2017   . Syncope 02/02/2016  . Faintness   . Essential hypertension 11/16/2015  . Hypothyroidism 11/16/2015  . Acute on chronic systolic CHF (congestive heart failure) ([place]) 11/15/2015  . Hypertensive heart disease 10/04/2015  . Elevated troponin 10/04/2015  . Acute respiratory failure ([place])   . Acute systolic congestive heart failure ([place])   . NICM (nonischemic cardiomyopathy) ([place]) 08/11/2015  . Normal coronary arteries 08/11/2015  . Dyslipidemia 07/27/2015  . CKD (chronic kidney disease) stage 3, GFR 30-59 ml/min ([facility]) 07/27/2015  . Anemia 07/27/2015  . Benign essential HTN 07/27/2015  . Hypothyroidism, adult 07/27/2015  . FUO (fever of unknown origin) 07/27/2015  . IDA (iron deficiency anemia) 07/27/2015  . Chronic systolic CHF (congestive heart failure) ([place]) 07/27/2015  . Colon cancer ([place]) 07/27/2015  . Sepsis ([place]) 07/24/2015  . Acute encephalopathy 07/24/2015  . Hypokalemia 07/24/2015  . Dementia 07/23/2015  . COPD (chronic obstructive pulmonary disease) ([place]) 04/25/2015  . ICD in place 04/14/2015  . Colostomy in place [facility]) 03/01/2015    Past Surgical History:  Procedure Laterality Date  . ABDOMINAL HYSTERECTOMY    . ANKLE FRACTURE SURGERY Left   . CARDIAC CATHETERIZATION N/A 03/22/2015   Procedure: Right/Left Heart Cath and Coronary Angiography;  Surgeon: Leonie Man, MD;  Location: [facility];  Service: Cardiovascular;  Laterality: N/A;  . CATARACT EXTRACTION W/ INTRAOCULAR  LENS  IMPLANT, BILATERAL Bilateral   . CHOLECYSTECTOMY    . COLON SURGERY     cancer  . DILATION AND CURETTAGE OF UTERUS    . FRACTURE SURGERY    . TUBAL LIGATION      Current Outpatient Rx  . Order #: [id number] Class: Historical Med  . Order #: [id number] Class: No Print  . Order #: [id number] Class: No Print  . Order #: [id number] Class: Historical Med  . Order #: [id number] Class: Historical Med  . Order #: [id number] Class: Print  . Order #: [id number] Class: Historical Med  . Order #:  [id number] Class: No Print  . Order #: [id number] Class: No Print  . Order #: [id number] Class: Historical Med  . Order #: [id number] Class: Print  . Order #: [id number] Class: Historical Med  . Order #: [id number] Class: Historical Med  . Order #: [id number] Class: Print  . Order #: [id number] Class: Historical Med  . Order #: [id number] Class: Print  . Order #: [id number] Class: Print  . Order #: [id number] Class: No Print  . Order #: [id number] Class: Historical Med  . Order #: [id number] Class: No Print    Allergies Patient has no known allergies.  Family History  Problem Relation Age of Onset  . CAD Unknown   . Hypertension Unknown   . Stroke Unknown   . Colon cancer Neg Hx   . GI Bleed Neg Hx     Social History Social History   Tobacco Use  . Smoking status: Never Smoker  . Smokeless tobacco: Never Used  Substance Use Topics  . Alcohol use: No  . Drug use: No    Review of Systems  Constitutional: No fever/chills Eyes: No visual changes. ENT: No sore throat. Positive congestion.  Cardiovascular: Denies chest pain. Respiratory: Positive shortness of breath and cough.  Gastrointestinal: No abdominal pain.  No nausea, no vomiting.  No diarrhea.  No constipation. Genitourinary: Negative for dysuria. Musculoskeletal: Negative for back pain. Skin: Negative for rash. Neurological: Negative for headaches, focal weakness or numbness.  10-point ROS otherwise negative.  ____________________________________________   PHYSICAL EXAM:  VITAL SIGNS: ED Triage Vitals  Enc Vitals Group     BP 11/28/17 1602 (!) 153/65     Pulse Rate 11/28/17 1602 89     Resp 11/28/17 1602 (!) 24     Temp 11/28/17 1602 99.3 F (37.4 C)     Temp Source 11/28/17 1602 Oral     SpO2 11/28/17 1602 92 %     Weight 11/28/17 1603 209 lb (94.8 kg)     Height 11/28/17 1603 5\' 6"  (1.676 m)    Constitutional: Alert and oriented. Patient with notable increased WOB.  Eyes: Conjunctivae are normal. Head:  Atraumatic. Nose: No congestion/rhinnorhea. Mouth/Throat: Mucous membranes are moist.  Neck: No stridor. Cardiovascular: Normal rate, regular rhythm. Good peripheral circulation. Grossly normal heart sounds.   Respiratory: Positive increased respiratory effort.  No retractions. Lungs with bilateral crackles worse at the bases. No wheezing.  Gastrointestinal: Soft and nontender. No distention.  Musculoskeletal: No lower extremity tenderness. Positive bilateral LE edema (1+ pitting). No gross deformities of extremities. Neurologic:  Normal speech and language. No gross focal neurologic deficits are appreciated.  Skin:  Skin is warm, dry and intact. No rash noted.  ____________________________________________   LABS (all labs ordered are listed, but only abnormal results are displayed)  Labs Reviewed  COMPREHENSIVE METABOLIC PANEL - Abnormal; Notable for the following components:      Result Value   BUN 30 (*)    Creatinine, Ser 1.38 (*)  AST 45 (*)    GFR calc non Af Amer 35 (*)    GFR calc Af Amer 40 (*)    All other components within normal limits  BRAIN NATRIURETIC PEPTIDE - Abnormal; Notable for the following components:   B Natriuretic Peptide 179.3 (*)    All other components within normal limits  CBC WITH DIFFERENTIAL/PLATELET - Abnormal; Notable for the following components:   RBC 3.28 (*)    Hemoglobin 9.2 (*)    HCT 29.9 (*)    All other components within normal limits  CULTURE, BLOOD (ROUTINE X 2)  CULTURE, BLOOD (ROUTINE X 2)  INFLUENZA PANEL BY PCR (TYPE A & B)  I-STAT TROPONIN, ED  I-STAT CG4 LACTIC ACID, ED   ____________________________________________  EKG   EKG Interpretation  Date/Time:  Wednesday November 28 2017 16:37:54 EST Ventricular Rate:  93 PR Interval:    QRS Duration: 170 QT Interval:  422 QTC Calculation: 525 R Axis:   108 Text Interpretation:  Sinus rhythm Nonspecific intraventricular conduction delay Lateral infarct, recent Anterior  infarct, possibly acute No STEMI.  Confirmed by Nanda Quinton [phone]) on 11/28/2017 4:49:07 PM       ____________________________________________  RADIOLOGY  Dg Chest 2 View  Result Date: 11/28/2017 CLINICAL DATA:  Cough, shortness of breath. EXAM: CHEST  2 VIEW COMPARISON:  Radiographs of October 30, 2017. FINDINGS: Stable cardiomediastinal silhouette. No pneumothorax or pleural effusion is noted. Atherosclerosis of thoracic aorta is noted. Left-sided pacemaker is unchanged in position. Right lung is clear. Possible mild left basilar atelectasis or infiltrate is noted. Bony thorax is unremarkable. IMPRESSION: Possible mild left basilar atelectasis or infiltrate is noted. Aortic Atherosclerosis (ICD10-I70.0). Electronically Signed   By: Marijo Conception, M.D.   On: 11/28/2017 17:13    ____________________________________________   PROCEDURES  Procedure(s) performed:   Procedures  None ____________________________________________   INITIAL IMPRESSION / ASSESSMENT AND PLAN / ED COURSE  Pertinent labs & imaging results that were available during my care of the patient were reviewed by me and considered in my medical decision making (see chart for details).  Patient presents to the emergency department for evaluation of dyspnea worsening over the past 2 days.  She has had unintentional weight gain and is fluid accumulation in the legs.  Her exam correlates well with volume overload.  Lower suspicion for pneumonia or flu but will send flu PCR given the season.  Following x-ray and labs.  EKG pending.  Patient is not having chest pain or other anginal equivalent symptoms.  No indication for BiPAP at this time but the patient does have notable increased work of breathing.  Plan to follow closely.  05:30 PM There is atelectasis versus early infiltrate on chest x-ray.  Lab testing so far is unremarkable.  BNP is pending.  Patient continues to feel short of breath.  She denies any known history  of PE and states she is never been on anticoagulation. Plan to start coverage for HCAP given SNF status and age. Will start neb here. No diuresis at this time. No indication for BiPAP currently.   06:16 PM Discussed patient's case with Hospitalist to request admission. Patient and family (if present) updated with plan. Care transferred to Hospitalist service.  I reviewed all nursing notes, vitals, pertinent old records, EKGs, labs, imaging (as available).  ____________________________________________  FINAL CLINICAL IMPRESSION(S) / ED DIAGNOSES  Final diagnoses:  Respiratory distress  HCAP (healthcare-associated pneumonia)     MEDICATIONS GIVEN DURING THIS VISIT:  Medications  ipratropium-albuterol (DUONEB) 0.5-2.5 (3) MG/3ML nebulizer solution 3 mL (not administered)  ceFEPIme (MAXIPIME) 2 g in sodium chloride 0.9 % 100 mL IVPB (not administered)  vancomycin (VANCOCIN) IVPB 1000 mg/200 mL premix (not administered)     Note:  This document was prepared using Dragon voice recognition software and may include unintentional dictation errors.  Nanda Quinton, MD Emergency Medicine    Lacoya Wilbanks, [facility], MD 11/28/17 [phone]

## 2017-11-28 NOTE — ED Triage Notes (Signed)
Pt arrived Via EMS from [facility] pt is alert and oriented x 4 and is verbally responsive. Pt is c/o cough and congestion x 2 day. Pt requires assistance with ambulation. Pt denies pain at this time. Pt has hx of CHF.       V/s 161/70 HR 93 RR 18 98% RA.

## 2017-11-28 NOTE — H&P (Signed)
History and Physical    [name]:[id number] DOB: 1934/08/26 DOA: 11/28/2017  PCP: [name], PA Patient coming from: Nursing home   I have personally briefly reviewed patient's old medical records in [place]  Chief Complaint: SOB  HPI: [name] is a 83 y.o. female with medical history significant of  Aspiration PNA, Systolic HF EF 45 % ECHO 0102, history of PE not on anticoagulation, COPD, patient recently discharge from hospital on 11-02-2017 after been treated for acute systolic HF, Aspiration PNA, patient presents today complaining of worsening SOB, wheezing, productive cough, runny nose since 2 days prior to admission. She denies chest pain, abdominal pain.   ED Course: Patient presents with SOB. BNP 179, cr1.3 priors 1.3, WBC 5.2, hb 9, chest x ray: possible mild left atelectasis or infiltrates.   Review of Systems: As per HPI otherwise 10 point review of systems negative. Patient denies chest pain, abdominal pain, vomiting, diarrhea.    Past Medical History:  Diagnosis Date  . Anginal pain ([place])   . Arthritis    "comes and goes" (03/01/2015)  . Asthma   . CHF (congestive heart failure) ([place])   . Colon cancer ([place])   . Colostomy care ([place])   . COPD (chronic obstructive pulmonary disease) ([place])   . Diabetes mellitus without complication ([place])    pt denies this hx on 03/01/2015  . Heart murmur   . History of blood transfusion 03/01/2015; 04/23/2017   "related to anemia,"  . Hyperlipidemia   . Hypertension   . Hypothyroidism   . Pneumonia    "just once" (03/01/2015)  . Pneumonia 04/23/2017  . Presence of combination internal cardiac defibrillator (ICD) and pacemaker    2005 in [facility]  . Pulmonary embolism ([place])   . Renal disorder     Past Surgical History:  Procedure Laterality Date  . ABDOMINAL HYSTERECTOMY    . ANKLE FRACTURE SURGERY Left   . CARDIAC CATHETERIZATION N/A 03/22/2015   Procedure: Right/Left Heart Cath and Coronary Angiography;   Surgeon: Leonie Man, MD;  Location: [facility];  Service: Cardiovascular;  Laterality: N/A;  . CATARACT EXTRACTION W/ INTRAOCULAR LENS  IMPLANT, BILATERAL Bilateral   . CHOLECYSTECTOMY    . COLON SURGERY     cancer  . DILATION AND CURETTAGE OF UTERUS    . FRACTURE SURGERY    . TUBAL LIGATION       reports that  has never smoked. she has never used smokeless tobacco. She reports that she does not drink alcohol or use drugs.  No Known Allergies  Family History  Problem Relation Age of Onset  . CAD Unknown   . Hypertension Unknown   . Stroke Unknown   . Colon cancer Neg Hx   . GI Bleed Neg Hx     Prior to Admission medications   Medication Sig Start Date End Date Taking? Authorizing Provider  acetaminophen (TYLENOL) 500 MG tablet Take 1,000 mg by mouth 3 (three) times daily.    Yes [provider]  albuterol (PROVENTIL) (2.5 MG/3ML) 0.083% nebulizer solution Take 3 mLs (2.5 mg total) by nebulization every 4 (four) hours as needed for wheezing. 04/28/15  Yes Geradine Girt, DO  aspirin EC 81 MG tablet Take 1 tablet (81 mg total) by mouth every morning. 03/03/15  Yes Rai, Ripudeep K, MD  atorvastatin (LIPITOR) 20 MG tablet Take 20 mg by mouth daily.    Yes [provider]  carboxymethylcellulose (REFRESH) 1 %  ophthalmic solution Place 1 drop into both eyes 3 (three) times daily.   Yes [provider]  carvedilol (COREG) 3.125 MG tablet Take 2 tablets (6.25 mg total) by mouth 2 (two) times daily with a meal. 11/02/17  Yes Mariel Aloe, MD  Fluticasone-Salmeterol (ADVAIR) 500-50 MCG/DOSE AEPB Inhale 1 puff into the lungs 2 (two) times daily. Rinse mouth after use   Yes [provider]  furosemide (LASIX) 40 MG tablet Take 1 tablet (40 mg total) by mouth daily. 12/30/15  Yes Shirley Friar, PA-C  hydrALAZINE (APRESOLINE) 25 MG tablet Take 0.5 tablets (12.5 mg total) by mouth 3 (three) times daily. 12/23/15  Yes Clegg, Amy D, NP  iron  polysaccharides (FERREX 150) 150 MG capsule Take 150 mg by mouth 2 (two) times daily.   Yes [provider]  isosorbide mononitrate (IMDUR) 30 MG 24 hr tablet Take 1 tablet (30 mg total) by mouth daily. 03/24/15  Yes Dhungel, Nishant, MD  levothyroxine (SYNTHROID, LEVOTHROID) 50 MCG tablet Take 50 mcg by mouth daily.  01/16/15  Yes [provider]  loratadine (CLARITIN) 10 MG tablet Take 10 mg by mouth daily.   Yes [provider]  potassium chloride (K-DUR) 10 MEQ tablet Take 1 tablet (10 mEq total) by mouth daily. 10/28/15  Yes Noemi Chapel, MD  ranitidine (ZANTAC) 150 MG tablet Take 150 mg by mouth daily.  01/14/15  Yes [provider]  benzonatate (TESSALON) 100 MG capsule Take 1 capsule (100 mg total) by mouth 3 (three) times daily as needed for cough. Patient not taking: Reported on 11/28/2017 01/15/17   Horton, Barbette Hair, MD  diclofenac sodium (VOLTAREN) 1 % GEL Apply 2 g topically 4 (four) times daily. Patient not taking: Reported on 11/28/2017 11/02/17   Mariel Aloe, MD  lisinopril (PRINIVIL,ZESTRIL) 10 MG tablet Take 1 tablet (10 mg total) by mouth daily. Patient not taking: Reported on 11/28/2017 04/29/17   Murlean Iba, MD  oxymetazoline (AFRIN) 0.05 % nasal spray Place 1 spray into both nostrils 2 (two) times daily as needed (for nose bleeds). Reported on 12/23/2015    [provider]  tamsulosin (FLOMAX) 0.4 MG CAPS capsule Take 1 capsule (0.4 mg total) by mouth daily after supper. Patient not taking: Reported on 11/28/2017 04/29/17   Murlean Iba, MD    Physical Exam: Vitals:   11/28/17 1602 11/28/17 1603 11/28/17 1639  BP: (!) 153/65  (!) 117/104  Pulse: 89  90  Resp: (!) 24  (!) 24  Temp: 99.3 F (37.4 C)  98.9 F (37.2 C)  TempSrc: Oral  Oral  SpO2: 92%  100%  Weight:  94.8 kg (209 lb)   Height:  5\' 6"  (1.676 m)     Constitutional: NAD, calm, comfortable, congested.  Vitals:   11/28/17 1602 11/28/17 1603 11/28/17  1639  BP: (!) 153/65  (!) 117/104  Pulse: 89  90  Resp: (!) 24  (!) 24  Temp: 99.3 F (37.4 C)  98.9 F (37.2 C)  TempSrc: Oral  Oral  SpO2: 92%  100%  Weight:  94.8 kg (209 lb)   Height:  5\' 6"  (1.676 m)    Eyes: PERRL, lids and conjunctivae normal ENMT: Mucous membranes are moist. Posterior pharynx clear of any exudate or lesions.Normal dentition.  Neck: normal, supple, no masses, no thyromegaly Respiratory: tachypnea, Bil;ateral crackles, wheezing.   Cardiovascular: Regular rate and rhythm, systolic murmurs / rubs / gallops. No extremity edema. 2+ pedal pulses. No carotid  bruits.  Abdomen: no tenderness, no masses palpated. No hepatosplenomegaly. Bowel sounds positive. Colostomy with bag.  Musculoskeletal: no clubbing / cyanosis. No joint deformity upper and lower extremities. Good ROM, no contractures. Normal muscle tone.  Skin: no rashes, lesions, ulcers. No induration Neurologic: CN 2-12 grossly intact. Sensation intact, DTR normal. Strength 5/5 in all 4.  Psychiatric: Normal judgment and insight. Alert and oriented x 3. Normal mood.     Labs on Admission: I have personally reviewed following labs and imaging studies  CBC: Recent Labs  Lab 11/28/17 1622  WBC 5.2  NEUTROABS 2.8  HGB 9.2*  HCT 29.9*  MCV 91.2  PLT 086   Basic Metabolic Panel: Recent Labs  Lab 11/28/17 1622  NA 137  K 4.4  CL 101  CO2 26  GLUCOSE 86  BUN 30*  CREATININE 1.38*  CALCIUM 9.4   GFR: Estimated Creatinine Clearance: 37.1 mL/min (A) (by C-G formula based on SCr of 1.38 mg/dL (H)). Liver Function Tests: Recent Labs  Lab 11/28/17 1622  AST 45*  ALT 40  ALKPHOS 79  BILITOT 0.3  PROT 8.0  ALBUMIN 4.2   No results for input(s): LIPASE, AMYLASE in the last 168 hours. No results for input(s): AMMONIA in the last 168 hours. Coagulation Profile: No results for input(s): INR, PROTIME in the last 168 hours. Cardiac Enzymes: No results for input(s): CKTOTAL, CKMB, CKMBINDEX,  TROPONINI in the last 168 hours. BNP (last 3 results) No results for input(s): PROBNP in the last 8760 hours. HbA1C: No results for input(s): HGBA1C in the last 72 hours. CBG: No results for input(s): GLUCAP in the last 168 hours. Lipid Profile: No results for input(s): CHOL, HDL, LDLCALC, TRIG, CHOLHDL, LDLDIRECT in the last 72 hours. Thyroid Function Tests: No results for input(s): TSH, T4TOTAL, FREET4, T3FREE, THYROIDAB in the last 72 hours. Anemia Panel: No results for input(s): VITAMINB12, FOLATE, FERRITIN, TIBC, IRON, RETICCTPCT in the last 72 hours. Urine analysis:    Component Value Date/Time   COLORURINE YELLOW 10/28/2017 0539   APPEARANCEUR CLEAR 10/28/2017 0539   LABSPEC 1.010 10/28/2017 0539   PHURINE 7.0 10/28/2017 0539   GLUCOSEU NEGATIVE 10/28/2017 0539   HGBUR NEGATIVE 10/28/2017 0539   BILIRUBINUR NEGATIVE 10/28/2017 0539   KETONESUR NEGATIVE 10/28/2017 0539   PROTEINUR NEGATIVE 10/28/2017 0539   UROBILINOGEN 0.2 07/23/2015 1610   NITRITE NEGATIVE 10/28/2017 0539   LEUKOCYTESUR NEGATIVE 10/28/2017 0539    Radiological Exams on Admission: Dg Chest 2 View  Result Date: 11/28/2017 CLINICAL DATA:  Cough, shortness of breath. EXAM: CHEST  2 VIEW COMPARISON:  Radiographs of October 30, 2017. FINDINGS: Stable cardiomediastinal silhouette. No pneumothorax or pleural effusion is noted. Atherosclerosis of thoracic aorta is noted. Left-sided pacemaker is unchanged in position. Right lung is clear. Possible mild left basilar atelectasis or infiltrate is noted. Bony thorax is unremarkable. IMPRESSION: Possible mild left basilar atelectasis or infiltrate is noted. Aortic Atherosclerosis (ICD10-I70.0). Electronically Signed   By: Marijo Conception, M.D.   On: 11/28/2017 17:13    EKG: Independently reviewed. Sinus, intraventricular conduction delay.   Assessment/Plan Active Problems:   Dyslipidemia   CKD (chronic kidney disease) stage 3, GFR 30-59 ml/min (HCC)   Benign  essential HTN   Hypothyroidism, adult   Chronic systolic CHF (congestive heart failure) (HCC)   Pneumonia  1-Dyspnea; this could be secondary to CHF exacerbation and or aspiration PNA. Chest x ray with possible infiltrates, patient with productive cough, crackles on lung exam. BNP at 179. Admit to telemetry.  Cycle cardiac enzymes.  IV lasix 40 mg IV daily. Monitor renal function on lasix. Last admission she develops AKI.  Will treat for aspiration PNA, with Unasyn. Speech evaluation. Patient report cough while eating.   2-Acute systolic HF exacerbation.  Present with dyspnea, Chest x ray without significant pulmonary edema, although patient has crackles on lung exam, BNP at 179. Her weight today is at 209, last discharge weight was at 198.  IV lasix 40 Mg IV daily. Monitor renal function.  Strict I and O .  Daily weight.   3-Possible aspiration PNA;  IV Unasyn.  Also influenza panel pending. Start tamiflu in the mean time.  Speech evaluation.  Follow blood culture.  Follow Strep pneumonia antigen.   CAD status post ICD/pacemaker Continue with aspirin and statins. Continue with coreg.   Hypothyroidism; continue with synthroid.   colon cancer status post colostomy  COPD. Nebulizer, IV solumedrol.    DVT prophylaxis: Lovenox Code Status: Full code.  Family Communication; no family at bedside. Care discussed with patient.  Disposition Plan: Back to SNF Consults called: none Admission status: patient with multiple comorbidity , presents with Dyspnea, congestion, concern for PNA and CFH exacerbation.    Elmarie Shiley MD [facility] Pager [phone]  If 7PM-7AM, please contact night-coverage [email or web address] Password [email or web address]  11/28/2017, 6:34 PM

## 2017-11-28 NOTE — Progress Notes (Signed)
Consults were received from an ED physician for Vancomycin/cefepime per pharmacy dosing.  The patient's profile has been reviewed for ht/wt/allergies/indication/available labs.   One time orders have been placed for vancomycin and cefepime.  Further antibiotics/pharmacy consults should be ordered by admitting physician if indicated.                       Thank you, Doreene Eland, PharmD, BCPS.   11/28/2017 5:41 PM

## 2017-11-28 NOTE — ED Notes (Signed)
ED TO INPATIENT HANDOFF REPORT  Name/Age/Gender [name] 84 y.o. female  Code Status Code Status History    Date Active Date Inactive Code Status Order ID Comments User Context   10/28/2017 06:58 11/02/2017 16:11 Full Code [id number]  Radene Gunning, NP ED   04/22/2017 16:30 04/29/2017 17:18 Full Code [id number]  Jani Gravel, MD Inpatient   02/16/2017 15:42 02/17/2017 18:04 Full Code [id number]  Rosita Fire, MD ED   02/02/2016 18:03 02/04/2016 21:01 Full Code [id number]  Willia Craze, NP Inpatient   11/15/2015 04:15 11/19/2015 20:55 Full Code [id number]  Gennaro Africa, MD ED   10/01/2015 15:26 10/05/2015 18:25 Full Code [id number]  Dionne Milo, NP Inpatient   07/24/2015 01:27 07/29/2015 17:14 Full Code [id number]  Toy Baker, MD Inpatient   04/25/2015 22:15 04/28/2015 19:24 Full Code [id number]  Ivor Costa, MD Inpatient   03/22/2015 10:59 03/24/2015 19:05 Full Code [id number]  Leonie Man, MD Inpatient   03/18/2015 06:24 03/22/2015 10:59 Full Code [id number]  Lavina Hamman, MD Inpatient   03/01/2015 16:28 03/04/2015 21:43 Full Code [id number]  Karen Kitchens Inpatient      Home/SNF/Other Nursing Home  Chief Complaint cold sx   Level of Care/Admitting Diagnosis ED Disposition    ED Disposition [facility] [[id number]]  Level of Care: Telemetry [5]  Admit to tele based on following criteria: Monitor for Ischemic changes  Diagnosis: Pneumonia [227785]  Admitting Physician: Elmarie Shiley [phone]  Attending Physician: Elmarie Shiley [phone]  Estimated length of stay: 3 - 4 days  Certification:: I certify this patient will need inpatient services for at least 2 midnights  PT Class (Do Not Modify): Inpatient [101]  PT Acc Code (Do Not Modify): Private [1]       Medical History Past Medical History:  Diagnosis Date  . Anginal pain ([place])   . Arthritis    "comes and goes" (03/01/2015)  .  Asthma   . CHF (congestive heart failure) ([place])   . Colon cancer ([place])   . Colostomy care ([place])   . COPD (chronic obstructive pulmonary disease) ([place])   . Diabetes mellitus without complication ([place])    pt denies this hx on 03/01/2015  . Heart murmur   . History of blood transfusion 03/01/2015; 04/23/2017   "related to anemia,"  . Hyperlipidemia   . Hypertension   . Hypothyroidism   . Pneumonia    "just once" (03/01/2015)  . Pneumonia 04/23/2017  . Presence of combination internal cardiac defibrillator (ICD) and pacemaker    2005 in [facility]  . Pulmonary embolism ([place])   . Renal disorder     Allergies No Known Allergies  IV Location/Drains/Wounds Patient Lines/Drains/Airways Status   Active Line/Drains/Airways    Name:   Placement date:   Placement time:   Site:   Days:   Peripheral IV 11/28/17 Left Antecubital   11/28/17    1920    Antecubital   less than 1   Colostomy LUQ   -    -    LUQ      Colostomy   -    -    -             Labs/Imaging Results for orders placed or performed during the hospital encounter of 11/28/17 (from the past 48 hour(s))  Comprehensive metabolic panel     Status: Abnormal   Collection Time: 11/28/17  4:22 PM  Result Value Ref Range   Sodium 137 135 - 145 mmol/L   Potassium 4.4 3.5 - 5.1 mmol/L   Chloride 101 101 - 111 mmol/L   CO2 26 22 - 32 mmol/L   Glucose, Bld 86 65 - 99 mg/dL   BUN 30 (H) 6 - 20 mg/dL   Creatinine, Ser 1.38 (H) 0.44 - 1.00 mg/dL   Calcium 9.4 8.9 - 10.3 mg/dL   Total Protein 8.0 6.5 - 8.1 g/dL   Albumin 4.2 3.5 - 5.0 g/dL   AST 45 (H) 15 - 41 U/L   ALT 40 14 - 54 U/L   Alkaline Phosphatase 79 38 - 126 U/L   Total Bilirubin 0.3 0.3 - 1.2 mg/dL   GFR calc non Af Amer 35 (L) >60 mL/min   GFR calc Af Amer 40 (L) >60 mL/min    Comment: (NOTE) The eGFR has been calculated using the CKD EPI equation. This calculation has not been validated in all clinical situations. eGFR's persistently <60 mL/min signify possible  Chronic Kidney Disease.    Anion gap 10 5 - 15    Comment: Performed at [facility], [place] [address], [place], [place] [address]  CBC with Differential     Status: Abnormal   Collection Time: 11/28/17  4:22 PM  Result Value Ref Range   WBC 5.2 4.0 - 10.5 K/uL   RBC 3.28 (L) 3.87 - 5.11 MIL/uL   Hemoglobin 9.2 (L) 12.0 - 15.0 g/dL   HCT 29.9 (L) 36.0 - 46.0 %   MCV 91.2 78.0 - 100.0 fL   MCH 28.0 26.0 - 34.0 pg   MCHC 30.8 30.0 - 36.0 g/dL   RDW 15.4 11.5 - 15.5 %   Platelets 224 150 - 400 K/uL   Neutrophils Relative % 54 %   Neutro Abs 2.8 1.7 - 7.7 K/uL   Lymphocytes Relative 31 %   Lymphs Abs 1.6 0.7 - 4.0 K/uL   Monocytes Relative 14 %   Monocytes Absolute 0.7 0.1 - 1.0 K/uL   Eosinophils Relative 1 %   Eosinophils Absolute 0.1 0.0 - 0.7 K/uL   Basophils Relative 0 %   Basophils Absolute 0.0 0.0 - 0.1 K/uL    Comment: Performed at [facility], [place] [address] [place], [place] [address]  Brain natriuretic peptide     Status: Abnormal   Collection Time: 11/28/17  4:23 PM  Result Value Ref Range   B Natriuretic Peptide 179.3 (H) 0.0 - 100.0 pg/mL    Comment: Performed at [facility], [place] [address], [place], [place] [address]  I-stat troponin, ED     Status: None   Collection Time: 11/28/17  4:45 PM  Result Value Ref Range   Troponin i, poc 0.01 0.00 - 0.08 ng/mL   Comment 3            Comment: Due to the release kinetics of cTnI, a negative result within the first hours of the onset of symptoms does not rule out myocardial infarction with certainty. If myocardial infarction is still suspected, repeat the test at appropriate intervals.   I-Stat CG4 Lactic Acid, ED  (not at  [facility])     Status: None   Collection Time: 11/28/17  7:00 PM  Result Value Ref Range   Lactic Acid, Venous 0.77 0.5 - 1.9 mmol/L   Dg Chest 2 View  Result Date: 11/28/2017 CLINICAL DATA:  Cough, shortness of breath. EXAM: CHEST  2 VIEW  COMPARISON:  Radiographs of October 30, 2017. FINDINGS: Stable cardiomediastinal silhouette. No pneumothorax or pleural effusion is noted. Atherosclerosis of thoracic aorta is noted. Left-sided pacemaker is unchanged in position. Right lung is clear. Possible mild left basilar atelectasis or infiltrate is noted. Bony thorax is unremarkable. IMPRESSION: Possible mild left basilar atelectasis or infiltrate is noted. Aortic Atherosclerosis (ICD10-I70.0). Electronically Signed   By: Marijo Conception, M.D.   On: 11/28/2017 17:13    Pending Labs Unresulted Labs (From admission, onward)   Start     Ordered   11/28/17 1853  Culture, sputum-assessment  Once,   R     11/28/17 1854   11/28/17 1853  Gram stain  Once,   R     11/28/17 1854   11/28/17 1853  Strep pneumoniae urinary antigen  Once,   R     11/28/17 1854   11/28/17 1730  Blood Culture (routine x 2)  BLOOD CULTURE X 2,   STAT     11/28/17 1729   11/28/17 1624  Influenza panel by PCR (type A & B)  (Influenza PCR Panel)  Once,   R     11/28/17 1623   Signed and Held  Basic metabolic panel  Tomorrow morning,   R     Signed and Held   Signed and Held  CBC  Tomorrow morning,   R     Signed and Held      Vitals/Pain Today's Vitals   11/28/17 1603 11/28/17 1639 11/28/17 1800 11/28/17 1900  BP:  (!) 117/104 (!) 171/64 (!) 164/72  Pulse:  90 87 94  Resp:  (!) '24 20 19  ' Temp:  98.9 F (37.2 C)    TempSrc:  Oral    SpO2:  100% 100% 100%  Weight: 209 lb (94.8 kg)     Height: '5\' 6"'  (1.676 m)       Isolation Precautions Droplet precaution  Medications Medications  ceFEPIme (MAXIPIME) 2 g in sodium chloride 0.9 % 100 mL IVPB (2 g Intravenous New Bag/Given 11/28/17 1901)  vancomycin (VANCOCIN) IVPB 1000 mg/200 mL premix (not administered)  methylPREDNISolone sodium succinate (SOLU-MEDROL) 40 mg/mL injection 40 mg (not administered)  furosemide (LASIX) injection 40 mg (not administered)  oseltamivir (TAMIFLU) capsule 75 mg (not  administered)  Ampicillin-Sulbactam (UNASYN) 3 g in sodium chloride 0.9 % 100 mL IVPB (not administered)  ipratropium-albuterol (DUONEB) 0.5-2.5 (3) MG/3ML nebulizer solution 3 mL (3 mLs Nebulization Given 11/28/17 1824)    Mobility walks with device

## 2017-11-29 DIAGNOSIS — I5022 Chronic systolic (congestive) heart failure: Secondary | ICD-10-CM

## 2017-11-29 LAB — BASIC METABOLIC PANEL
ANION GAP: 13 (ref 5–15)
BUN: 27 mg/dL — ABNORMAL HIGH (ref 6–20)
CALCIUM: 9.5 mg/dL (ref 8.9–10.3)
CHLORIDE: 102 mmol/L (ref 101–111)
CO2: 24 mmol/L (ref 22–32)
Creatinine, Ser: 1.32 mg/dL — ABNORMAL HIGH (ref 0.44–1.00)
GFR calc non Af Amer: 37 mL/min — ABNORMAL LOW (ref >60)
GFR, EST AFRICAN AMERICAN: 43 mL/min — AB (ref >60)
Glucose, Bld: 118 mg/dL — ABNORMAL HIGH (ref 65–99)
POTASSIUM: 4.2 mmol/L (ref 3.5–5.1)
Sodium: 139 mmol/L (ref 135–145)

## 2017-11-29 LAB — CBC
HCT: 29.7 % — ABNORMAL LOW (ref 36.0–46.0)
Hemoglobin: 9.3 g/dL — ABNORMAL LOW (ref 12.0–15.0)
MCH: 28.4 pg (ref 26.0–34.0)
MCHC: 31.3 g/dL (ref 30.0–36.0)
MCV: 90.8 fL (ref 78.0–100.0)
Platelets: 235 10*3/uL (ref 150–400)
RBC: 3.27 MIL/uL — AB (ref 3.87–5.11)
RDW: 15.2 % (ref 11.5–15.5)
WBC: 5.6 10*3/uL (ref 4.0–10.5)

## 2017-11-29 LAB — STREP PNEUMONIAE URINARY ANTIGEN: Strep Pneumo Urinary Antigen: NEGATIVE

## 2017-11-29 LAB — TROPONIN I: TROPONIN I: 0.03 ng/mL — AB (ref <0.03)

## 2017-11-29 MED ORDER — HEPARIN SODIUM (PORCINE) 5000 UNIT/ML IJ SOLN
5000.0000 [IU] | Freq: Three times a day (TID) | INTRAMUSCULAR | Status: DC
  Administered 2017-11-29 – 2017-12-01 (×6): 5000 [IU] via SUBCUTANEOUS
  Filled 2017-11-29 (×5): qty 1

## 2017-11-29 MED ORDER — IPRATROPIUM-ALBUTEROL 0.5-2.5 (3) MG/3ML IN SOLN
3.0000 mL | Freq: Three times a day (TID) | RESPIRATORY_TRACT | Status: DC
  Administered 2017-11-30 – 2017-12-01 (×4): 3 mL via RESPIRATORY_TRACT
  Filled 2017-11-29 (×4): qty 3

## 2017-11-29 MED ORDER — GUAIFENESIN ER 600 MG PO TB12
600.0000 mg | ORAL_TABLET | Freq: Two times a day (BID) | ORAL | Status: DC
  Administered 2017-11-29 – 2017-12-01 (×4): 600 mg via ORAL
  Filled 2017-11-29 (×4): qty 1

## 2017-11-29 NOTE — Evaluation (Signed)
Clinical/Bedside Swallow Evaluation Patient Details  Name: XYLA LEISNER MRN: [id number] Date of Birth: 1933/11/23  Today's Date: 11/29/2017 Time: SLP Start Time (ACUTE ONLY): 0906 SLP Stop Time (ACUTE ONLY): 0912 SLP Time Calculation (min) (ACUTE ONLY): 6 min  Past Medical History:  Past Medical History:  Diagnosis Date  . Anginal pain ([place])   . Arthritis    "comes and goes" (03/01/2015)  . Asthma   . CHF (congestive heart failure) ([place])   . Colon cancer ([place])   . Colostomy care ([place])   . COPD (chronic obstructive pulmonary disease) ([place])   . Diabetes mellitus without complication ([place])    pt denies this hx on 03/01/2015  . Heart murmur   . History of blood transfusion 03/01/2015; 04/23/2017   "related to anemia,"  . Hyperlipidemia   . Hypertension   . Hypothyroidism   . Pneumonia    "just once" (03/01/2015)  . Pneumonia 04/23/2017  . Presence of combination internal cardiac defibrillator (ICD) and pacemaker    2005 in [facility]  . Pulmonary embolism ([place])   . Renal disorder    Past Surgical History:  Past Surgical History:  Procedure Laterality Date  . ABDOMINAL HYSTERECTOMY    . ANKLE FRACTURE SURGERY Left   . CARDIAC CATHETERIZATION N/A 03/22/2015   Procedure: Right/Left Heart Cath and Coronary Angiography;  Surgeon: Leonie Man, MD;  Location: [facility];  Service: Cardiovascular;  Laterality: N/A;  . CATARACT EXTRACTION W/ INTRAOCULAR LENS  IMPLANT, BILATERAL Bilateral   . CHOLECYSTECTOMY    . COLON SURGERY     cancer  . DILATION AND CURETTAGE OF UTERUS    . FRACTURE SURGERY    . TUBAL LIGATION     HPI:  Neriah Brott Hughesis a 84 y.o.femalewith medical history significant ofaspiration PNA, Systolic HF, PE, COPD, patient recently discharge from hospital on 11-02-2017 after been treated for acute systolic HF, Aspiration PNA, patient presents today complaining of worsening SOB, wheezing, productive cough, runny nose since 2 days prior to admission. CXR Possible  mild left basilar atelectasis or infiltrate is noted. Normal esophagram 10/30/17. BSE 04/2017 suspected esophageal dysphagia, reg/thin recommended.    Assessment / Plan / Recommendation Clinical Impression  Pt has had multiple admission with pneumonia with history of COPD. BSE 04/2017 suspected possible esophageal deficits however recommended pt have an MBS to objectively assess swallow function if she has additional admissions for pna. Observation of Dys 3, thin liquid diet did not reveal s/s aspiration and pt states she only has trouble swallowing "if it goes the wrong way." Esophagram 10/2017 was normal. Given big clinical picture, recommend MBS to be completed 2/22. No change in Dys 3, thin liquids at this time.  SLP Visit Diagnosis: Dysphagia, unspecified (R13.10)    Aspiration Risk  (mild-mod)    Diet Recommendation Dysphagia 3 (Mech soft);Thin liquid   Liquid Administration via: Cup;Straw Medication Administration: Whole meds with liquid Supervision: Patient able to self feed Compensations: Slow rate;Small sips/bites Postural Changes: Seated upright at 90 degrees    Other  Recommendations Oral Care Recommendations: Oral care BID   Follow up Recommendations (TBD)      Frequency and Duration            Prognosis        Swallow Study   General HPI: Latonja Bobeck Hughesis a 84 y.o.femalewith medical history significant ofaspiration PNA, Systolic HF, PE, COPD, patient recently discharge from hospital on 11-02-2017 after been treated for acute systolic HF, Aspiration PNA,  patient presents today complaining of worsening SOB, wheezing, productive cough, runny nose since 2 days prior to admission. CXR Possible mild left basilar atelectasis or infiltrate is noted. Normal esophagram 10/30/17. BSE 04/2017 suspected esophageal dysphagia, reg/thin recommended.  Type of Study: Bedside Swallow Evaluation Previous Swallow Assessment: (see HPI) Diet Prior to this Study: Dysphagia 3 (soft);Thin  liquids Temperature Spikes Noted: No Respiratory Status: Room air History of Recent Intubation: No Behavior/Cognition: Alert;Cooperative;Pleasant mood Oral Cavity Assessment: Within Functional Limits Oral Care Completed by SLP: No Oral Cavity - Dentition: Dentures, top(lower plate ill fitting) Vision: Functional for self-feeding Self-Feeding Abilities: Able to feed self Patient Positioning: Upright in bed Baseline Vocal Quality: Normal Volitional Cough: Strong Volitional Swallow: Able to elicit    Oral/Motor/Sensory Function Overall Oral Motor/Sensory Function: Within functional limits   Ice Chips Ice chips: Not tested   Thin Liquid Thin Liquid: Within functional limits Presentation: Straw    Nectar Thick Nectar Thick Liquid: Not tested   Honey Thick Honey Thick Liquid: Not tested   Puree Puree: Not tested   Solid   GO   Solid: Within functional limits        Houston Siren 11/29/2017,9:59 AM  Orbie Pyo Colvin Caroli.Ed [employer] [phone]

## 2017-11-29 NOTE — Progress Notes (Addendum)
PROGRESS NOTE    [name]  HEN:[id number] DOB: Sep 08, 1934 DOA: 11/28/2017 PCP: [name], PA   Brief Narrative: Patient is a 84 year old female with past medical history of aspiration pneumonia, systolic heart failure with ejection fraction of 45% as per echocardiogram 2018, history of PE but not on anti-Coagulation, COPD who presented to the emergency department with complaint of worsening shortness of breath, wheezing, productive cough and runny nose.  Patient was recently discharged on 11/02/17 after she was admitted for acute congestive heart failure.  Patient was suspected to haveAspiration pneumonia on presentation and was admitted for further management.  Assessment & Plan:   Principal Problem:   Pneumonia Active Problems:   Dyslipidemia   CKD (chronic kidney disease) stage 3, GFR 30-59 ml/min (HCC)   Benign essential HTN   Hypothyroidism, adult   Chronic systolic CHF (congestive heart failure) (HCC)  Aspiration pneumonia: Patient has history of aspiration pneumonia in the past.  Chest x-ray on presentation showed mild left basilar atelectasis versus infiltrates. Patient has been started on Unasyn.She is afebrile Speech therapy stable to the patient and started her on dysphagia 3, thin liquid.  We will continue aspiration precaution.  Dyspnea: Likely combination of aspiration pneumonia and CHF exacerbation.  Patient presented with cough.  She has crackles bilaterally on examination.  Respiratory status is much stable now.  Systolic CHF: Was recently admitted for the management of CHF.  Her ejection fraction is 45% as per the echocardiogram in 2018.  She has bilateral basal crackles.  BNP was mildly elevated on presentation.  Her weight was 198 on discharge but found to have 209 on this admission. Seems to be compliant on her medication.  She needs to be on salt restricted diet and her fluids would be associated to less than 1.5 L a day. Patient is on Lasix 40 mg daily at  home.  Started on IV Lasix here. We will continue to monitor input and output, daily weight  History of coronary artery disease: Status post ICD/pacemaker.  Continue aspirin and statin.  Continue Coreg  History of hypothyroidism: Continue Synthyroid  History of colon cancer: Currently stable.  S/P colostomy  COPD: Continue nebulizer treatment.  Currently she is not wheezing.  Discontinued IV Solu-Medrol.  CKD stage III: Her baseline creatinine ranges from 1.2-1.4.  Currently kidney function is at baseline.  We will continue to monitor kidney function as she is on Lasix.   Patient needs inpatient care for management of aspiration PNA and CHF. She is also on IV antibiotics.   DVT prophylaxis: Heparin Code Status: Full Family Communication: None present at the bedside Disposition Plan: Undetermined at present   Consultants: None  Procedures: None  Antimicrobials: Unasyn since 11/28/17  Subjective: Patient seen and examined the bedside this morning.  Respiratory status looks better.  Still complains of cough.  Has crackles on bilateral bases.    Objective: Vitals:   11/29/17 0900 11/29/17 0941 11/29/17 1429 11/29/17 1435  BP: (!) 154/57  (!) 119/44   Pulse: 85  84   Resp:   18   Temp:   99.2 F (37.3 C)   TempSrc:   Oral   SpO2:  93% 97% 94%  Weight:      Height:        Intake/Output Summary (Last 24 hours) at 11/29/2017 1548 Last data filed at 11/29/2017 1429 Gross per 24 hour  Intake 440 ml  Output 2375 ml  Net -1935 ml   [employer]   11/28/17  1603 11/28/17 1953 11/29/17 0528  Weight: 94.8 kg (209 lb) 98.4 kg (216 lb 14.9 oz) 98.2 kg (216 lb 7.9 oz)    Examination:  General exam: Appears calm and comfortable ,Not in distress,average built HEENT:PERRL,Oral mucosa moist, Ear/Nose normal on gross exam Respiratory system: Bilateral occasional entry, bilateral basal crackles  cardiovascular system: S1 & S2 heard, RRR. No JVD, murmurs, rubs, gallops or clicks.  No pedal edema. Gastrointestinal system: Abdomen is nondistended, soft and nontender. No organomegaly or masses felt. Normal bowel sounds heard,Colostomy Central nervous system: Alert and oriented. No focal neurological deficits. Extremities: No edema, no clubbing ,no cyanosis, distal peripheral pulses palpable. Skin: No rashes, lesions or ulcers,no icterus ,no pallor MSK: Normal muscle bulk,tone ,power Psychiatry: Judgement and insight appear normal. Mood & affect appropriate.     Data Reviewed: I have personally reviewed following labs and imaging studies  CBC: Recent Labs  Lab 11/28/17 1622 11/29/17 0236  WBC 5.2 5.6  NEUTROABS 2.8  --   HGB 9.2* 9.3*  HCT 29.9* 29.7*  MCV 91.2 90.8  PLT 224 229   Basic Metabolic Panel: Recent Labs  Lab 11/28/17 1622 11/29/17 0236  NA 137 139  K 4.4 4.2  CL 101 102  CO2 26 24  GLUCOSE 86 118*  BUN 30* 27*  CREATININE 1.38* 1.32*  CALCIUM 9.4 9.5   GFR: Estimated Creatinine Clearance: 39.5 mL/min (A) (by C-G formula based on SCr of 1.32 mg/dL (H)). Liver Function Tests: Recent Labs  Lab 11/28/17 1622  AST 45*  ALT 40  ALKPHOS 79  BILITOT 0.3  PROT 8.0  ALBUMIN 4.2   No results for input(s): LIPASE, AMYLASE in the last 168 hours. No results for input(s): AMMONIA in the last 168 hours. Coagulation Profile: No results for input(s): INR, PROTIME in the last 168 hours. Cardiac Enzymes: Recent Labs  Lab 11/28/17 2118 11/29/17 0236  TROPONINI 0.03* 0.03*   BNP (last 3 results) No results for input(s): PROBNP in the last 8760 hours. HbA1C: No results for input(s): HGBA1C in the last 72 hours. CBG: No results for input(s): GLUCAP in the last 168 hours. Lipid Profile: No results for input(s): CHOL, HDL, LDLCALC, TRIG, CHOLHDL, LDLDIRECT in the last 72 hours. Thyroid Function Tests: No results for input(s): TSH, T4TOTAL, FREET4, T3FREE, THYROIDAB in the last 72 hours. Anemia Panel: No results for input(s): VITAMINB12,  FOLATE, FERRITIN, TIBC, IRON, RETICCTPCT in the last 72 hours. Sepsis Labs: Recent Labs  Lab 11/28/17 1900  LATICACIDVEN 0.77    Recent Results (from the past 240 hour(s))  MRSA PCR Screening     Status: None   Collection Time: 11/28/17  8:18 PM  Result Value Ref Range Status   MRSA by PCR NEGATIVE NEGATIVE Final    Comment:        The GeneXpert MRSA Assay (FDA approved for NASAL specimens only), is one component of a comprehensive MRSA colonization surveillance program. It is not intended to diagnose MRSA infection nor to guide or monitor treatment for MRSA infections. Performed at [facility], [place] [address] [place], [place] [address]          Radiology Studies: Dg Chest 2 View  Result Date: 11/28/2017 CLINICAL DATA:  Cough, shortness of breath. EXAM: CHEST  2 VIEW COMPARISON:  Radiographs of October 30, 2017. FINDINGS: Stable cardiomediastinal silhouette. No pneumothorax or pleural effusion is noted. Atherosclerosis of thoracic aorta is noted. Left-sided pacemaker is unchanged in position. Right lung is clear. Possible mild left basilar  atelectasis or infiltrate is noted. Bony thorax is unremarkable. IMPRESSION: Possible mild left basilar atelectasis or infiltrate is noted. Aortic Atherosclerosis (ICD10-I70.0). Electronically Signed   By: Marijo Conception, M.D.   On: 11/28/2017 17:13        Scheduled Meds: . aspirin EC  81 mg Oral Daily  . atorvastatin  20 mg Oral Daily  . carvedilol  6.25 mg Oral BID WC  . enoxaparin (LOVENOX) injection  40 mg Subcutaneous Q24H  . famotidine  10 mg Oral Daily  . furosemide  40 mg Intravenous Daily  . ipratropium-albuterol  3 mL Nebulization Q6H  . iron polysaccharides  150 mg Oral BID  . isosorbide mononitrate  30 mg Oral Daily  . levothyroxine  50 mcg Oral QAC breakfast  . loratadine  10 mg Oral Daily  . mometasone-formoterol  2 puff Inhalation BID  . polyvinyl alcohol  1 drop Both Eyes TID  . potassium  chloride  10 mEq Oral Daily  . sodium chloride flush  3 mL Intravenous Q12H   Continuous Infusions: . sodium chloride    . ampicillin-sulbactam (UNASYN) IV Stopped (11/29/17 1256)  . vancomycin       LOS: 1 day    Time spent: More than 50% of that time was spent in counseling and/or coordination of care.      Marene Lenz, MD [facility] Pager [phone]  If 7PM-7AM, please contact night-coverage [email or web address] Password [facility] 11/29/2017, 3:48 PM

## 2017-11-29 NOTE — Progress Notes (Signed)
CRITICAL VALUE ALERT  Critical Value:  Troponin 0.03  Date & Time Notied:  2/20 2130  Provider Notified: Baltazar Najjar, NP  Orders Received/Actions taken: No new orders at this time

## 2017-11-29 NOTE — Clinical Social Work Note (Signed)
Clinical Social Work Assessment  Patient Details  Name: [name] MRN: [id number] Date of Birth: 1934-02-23  Date of referral:  11/29/17               Reason for consult:  Facility Placement, Discharge Planning                Permission sought to share information with:  Facility Art therapist granted to share information::  Yes, Verbal Permission Granted  Name::        Agency::     Relationship::     Contact Information:     Housing/Transportation Living arrangements for the past 2 months:  [place] of Information:  Patient Patient Interpreter Needed:  None Criminal Activity/Legal Involvement Pertinent to Current Situation/Hospitalization:  No - Comment as needed Significant Relationships:    Lives with:  Facility Resident Do you feel safe going back to the place where you live?  Yes Need for family participation in patient care:  No (Coment)  Care giving concerns:  Patient from [place]. Patient reported that she has been at [facility] since July 2018. Patient reported that she requires assistance with ADLs sometimes and that she uses a walker. Patient verbalized plan to return to ALF when medically stable for discharge.   Social Worker assessment / plan:  CSW spoke with patient at bedside regarding discharge planning. Patient verbalized plan to dc back to [facility]F. Patient reported that she will need PTAR.  CSW contacted [facility] to confirm patient's ability to return at dc. Staff member Verdene Lennert reported that patient is able to return.   CSW will complete FL2 and continue to follow and assist with discharge planning.  Employment status:  Retired Nurse, adult PT Recommendations:  Not assessed at this time Information / Referral to community resources:  Other (Comment Required)(Patient from [facility])  Patient/Family's Response to care:  Patient appreciative  of CSW assistance with discharge planning.   Patient/Family's Understanding of and Emotional Response to Diagnosis, Current Treatment, and Prognosis:  Patient verbalized some understanding of current treatment and reported that they are doing tests to learn more about her prognosis. Patient reported that she came in SOB and she is feeling a little better. Patient verbalized plan to dc back to ALF when medically stable.   Emotional Assessment Appearance:  Appears stated age Attitude/Demeanor/Rapport:  Other(Cooperative) Affect (typically observed):  Appropriate, Calm Orientation:  Oriented to Self, Oriented to Place, Oriented to  Time, Oriented to Situation Alcohol / Substance use:  Not Applicable Psych involvement (Current and /or in the community):  No (Comment)  Discharge Needs  Concerns to be addressed:  Care Coordination Readmission within the last 30 days:  Yes Current discharge risk:  None Barriers to Discharge:  Continued Medical Work up   [employer], LCSW 11/29/2017, 2:16 PM

## 2017-11-30 ENCOUNTER — Inpatient Hospital Stay (HOSPITAL_COMMUNITY): Payer: Medicare HMO

## 2017-11-30 DIAGNOSIS — I5023 Acute on chronic systolic (congestive) heart failure: Secondary | ICD-10-CM

## 2017-11-30 DIAGNOSIS — I10 Essential (primary) hypertension: Secondary | ICD-10-CM

## 2017-11-30 LAB — BASIC METABOLIC PANEL
Anion gap: 12 (ref 5–15)
BUN: 33 mg/dL — AB (ref 6–20)
CO2: 25 mmol/L (ref 22–32)
CREATININE: 1.41 mg/dL — AB (ref 0.44–1.00)
Calcium: 8.9 mg/dL (ref 8.9–10.3)
Chloride: 104 mmol/L (ref 101–111)
GFR calc Af Amer: 39 mL/min — ABNORMAL LOW (ref >60)
GFR calc non Af Amer: 34 mL/min — ABNORMAL LOW (ref >60)
GLUCOSE: 83 mg/dL (ref 65–99)
POTASSIUM: 4.1 mmol/L (ref 3.5–5.1)
SODIUM: 141 mmol/L (ref 135–145)

## 2017-11-30 NOTE — Progress Notes (Addendum)
Patient ID: [name], female   DOB: [birth year], 84 y.o.   MRN: [id number]  PROGRESS NOTE    [name]  HER:[id number] DOB: 10/06/1934 DOA: 11/28/2017 PCP: [name], PA   Brief Narrative:  84 year old female with history of aspiration pneumonia, systolic heart failure with ejection fraction of 45% as per echocardiogram in 04/2017, history of PE but not on anticoagulation, COPD presented with worsening shortness of breath, wheezing, productive cough and runny nose. Patient was recently discharged on 11/02/2017 after she was admitted for acute congestive heart failure. Patient was admitted with aspiration pneumonia and started on antibiotics.   Assessment & Plan:   Principal Problem:   Pneumonia Active Problems:   Dyslipidemia   CKD (chronic kidney disease) stage 3, GFR 30-59 ml/min (HCC)   Benign essential HTN   Hypothyroidism, adult   Chronic systolic CHF (congestive heart failure) (HCC)   Aspiration pneumonia: Patient has history of aspiration pneumonia in the past.  Chest x-ray on presentation showed mild left basilar atelectasis versus infiltrates. - Continue Unasyn. Afebrile. Cultures negative - Diet as per SLP recommendations. Continue aspiration precautions  Dyspnea: Likely combination of aspiration pneumonia and CHF exacerbation.   - Respiratory status stable. Currently on room air  Acute on chronic Systolic CHF: Was recently admitted for the management of CHF.  Her ejection fraction is 45% as per the echocardiogram in 04/2017.   - Continue strict input and output and daily weights. Negative balance of 1752 mL since admission. Continue intravenous Lasix. Continue Coreg and isosorbide mononitrate. Outpatient follow-up with cardiology - Monitor renal function while on intravenous Lasix  History of coronary artery disease: Status post ICD/pacemaker.  Continue aspirin and statin.  Continue Coreg  History of hypothyroidism: Continue Synthyroid  History of colon  cancer: Currently stable.  S/P colostomy  COPD: Continue nebulizer treatment.   stable. Not wheezing. Off Solu-Medrol. continue dulera  CKD stage III: Her baseline creatinine ranges from 1.2-1.4.  Currently kidney function is at baseline.  We will continue to monitor kidney function as she is on Lasix  Generalized deconditioning -PT eval   DVT prophylaxis: Heparin Code Status:  Full Family Communication: None at bedside Disposition Plan: Depends clinical outcome  Consultants: None  Procedures: None  Antimicrobials: Unasyn since 11/28/2017    Subjective: Patient seen and examined at bedside. She does not feel well. She feels weak and tired. She still short of breath and coughing. No overnight fever or vomiting.  Objective: Vitals:   11/29/17 2113 11/30/17 0418 11/30/17 0900 11/30/17 1040  BP: (!) 128/50 (!) 140/56  (!) 125/47  Pulse: 88 78  80  Resp: 20 19    Temp: 98.1 F (36.7 C) 98.3 F (36.8 C)    TempSrc: Oral Oral    SpO2: 98% 98% 95%   Weight:  98.1 kg (216 lb 4.3 oz)    Height:        Intake/Output Summary (Last 24 hours) at 11/30/2017 1241 Last data filed at 11/30/2017 1127 Gross per 24 hour  Intake 663 ml  Output 1525 ml  Net -862 ml   Filed Weights   11/28/17 1953 11/29/17 0528 11/30/17 0418  Weight: 98.4 kg (216 lb 14.9 oz) 98.2 kg (216 lb 7.9 oz) 98.1 kg (216 lb 4.3 oz)    Examination:  General exam: Appears calm and comfortable. No distress Respiratory system: Bilateral decreased breath sound at bases with basilar crackles Cardiovascular system: S1 & S2 heard, rate controlled  Gastrointestinal system: Abdomen is nondistended, soft  and nontender. Normal bowel sounds heard. Central nervous system: Alert and oriented. No focal neurological deficits. Moving extremities Extremities: No cyanosis, clubbing; trace edema  Skin: No rashes, lesions or ulcers Lymph: No cervical lymphadenopathy  Data Reviewed: I have personally reviewed following labs and  imaging studies  CBC: Recent Labs  Lab 11/28/17 1622 11/29/17 0236  WBC 5.2 5.6  NEUTROABS 2.8  --   HGB 9.2* 9.3*  HCT 29.9* 29.7*  MCV 91.2 90.8  PLT 224 983   Basic Metabolic Panel: Recent Labs  Lab 11/28/17 1622 11/29/17 0236 11/30/17 0517  NA 137 139 141  K 4.4 4.2 4.1  CL 101 102 104  CO2 26 24 25   GLUCOSE 86 118* 83  BUN 30* 27* 33*  CREATININE 1.38* 1.32* 1.41*  CALCIUM 9.4 9.5 8.9   GFR: Estimated Creatinine Clearance: 37 mL/min (A) (by C-G formula based on SCr of 1.41 mg/dL (H)). Liver Function Tests: Recent Labs  Lab 11/28/17 1622  AST 45*  ALT 40  ALKPHOS 79  BILITOT 0.3  PROT 8.0  ALBUMIN 4.2   No results for input(s): LIPASE, AMYLASE in the last 168 hours. No results for input(s): AMMONIA in the last 168 hours. Coagulation Profile: No results for input(s): INR, PROTIME in the last 168 hours. Cardiac Enzymes: Recent Labs  Lab 11/28/17 2118 11/29/17 0236  TROPONINI 0.03* 0.03*   BNP (last 3 results) No results for input(s): PROBNP in the last 8760 hours. HbA1C: No results for input(s): HGBA1C in the last 72 hours. CBG: No results for input(s): GLUCAP in the last 168 hours. Lipid Profile: No results for input(s): CHOL, HDL, LDLCALC, TRIG, CHOLHDL, LDLDIRECT in the last 72 hours. Thyroid Function Tests: No results for input(s): TSH, T4TOTAL, FREET4, T3FREE, THYROIDAB in the last 72 hours. Anemia Panel: No results for input(s): VITAMINB12, FOLATE, FERRITIN, TIBC, IRON, RETICCTPCT in the last 72 hours. Sepsis Labs: Recent Labs  Lab 11/28/17 1900  LATICACIDVEN 0.77    Recent Results (from the past 240 hour(s))  Blood Culture (routine x 2)     Status: None (Preliminary result)   Collection Time: 11/28/17  6:52 PM  Result Value Ref Range Status   Specimen Description   Final    BLOOD RIGHT ANTECUBITAL Performed at [facility] [address] [place], [place] [address]    Special Requests   Final    BOTTLES  DRAWN AEROBIC AND ANAEROBIC Blood Culture results may not be optimal due to an inadequate volume of blood received in culture bottles Performed at [facility] [address] [place], [place] [address]    Culture   Final    NO GROWTH < 24 HOURS Performed at [facility] [address], [place], [place] [address]    Report Status PENDING  Incomplete  MRSA PCR Screening     Status: None   Collection Time: 11/28/17  8:18 PM  Result Value Ref Range Status   MRSA by PCR NEGATIVE NEGATIVE Final    Comment:        The GeneXpert MRSA Assay (FDA approved for NASAL specimens only), is one component of a comprehensive MRSA colonization surveillance program. It is not intended to diagnose MRSA infection nor to guide or monitor treatment for MRSA infections. Performed at [facility], [place] [address], [place], [place] [address]          Radiology Studies: Dg Chest 2 View  Result Date: 11/28/2017 CLINICAL DATA:  Cough, shortness  of breath. EXAM: CHEST  2 VIEW COMPARISON:  Radiographs of October 30, 2017. FINDINGS: Stable cardiomediastinal silhouette. No pneumothorax or pleural effusion is noted. Atherosclerosis of thoracic aorta is noted. Left-sided pacemaker is unchanged in position. Right lung is clear. Possible mild left basilar atelectasis or infiltrate is noted. Bony thorax is unremarkable. IMPRESSION: Possible mild left basilar atelectasis or infiltrate is noted. Aortic Atherosclerosis (ICD10-I70.0). Electronically Signed   By: Marijo Conception, M.D.   On: 11/28/2017 17:13        Scheduled Meds: . aspirin EC  81 mg Oral Daily  . atorvastatin  20 mg Oral Daily  . carvedilol  6.25 mg Oral BID WC  . famotidine  10 mg Oral Daily  . furosemide  40 mg Intravenous Daily  . guaiFENesin  600 mg Oral BID  . heparin injection (subcutaneous)  5,000 Units Subcutaneous Q8H  . ipratropium-albuterol  3 mL Nebulization TID  . iron polysaccharides   150 mg Oral BID  . isosorbide mononitrate  30 mg Oral Daily  . levothyroxine  50 mcg Oral QAC breakfast  . loratadine  10 mg Oral Daily  . mometasone-formoterol  2 puff Inhalation BID  . polyvinyl alcohol  1 drop Both Eyes TID  . potassium chloride  10 mEq Oral Daily  . sodium chloride flush  3 mL Intravenous Q12H   Continuous Infusions: . sodium chloride    . ampicillin-sulbactam (UNASYN) IV Stopped (11/30/17 6122)  . vancomycin       LOS: 2 days        Aline August, MD [facility] Pager [phone]  If 7PM-7AM, please contact night-coverage [email or web address] Password [facility] 11/30/2017, 12:41 PM

## 2017-11-30 NOTE — Progress Notes (Signed)
Modified Barium Swallow Progress Note  Patient Details  Name: [name] MRN: [id number] Date of Birth: [birth year]  Today's Date: 11/30/2017  Modified Barium Swallow completed.  Full report located under Chart Review in the Imaging Section.  Brief recommendations include the following:  Clinical Impression   Pt presents with mild oropharyngeal dysphagia. Pt had Mod oral residue throughout trials, benefiting from [place] verbal cues to take consecutive sips and use of liquid wash to aid oral clearance. Mastication deficits related to lack of dentures noted with solid trials. Pt had minimal flash penetration episode X1 with thin liquid without other incidence of airway compromise. Pt demonstrated airway protection with and without use of chin tuck strategy. Recommend Dys 3 (mech soft) and thin liquid diet and aspiration precuations. Given observed swallow safety and compliance with aspiration precautions/compensatory strategies, SLP services not indicated. Please re-consult in the event of acute changes.    Swallow Evaluation Recommendations       SLP Diet Recommendations: Dysphagia 3 (Mech soft) solids;Thin liquid   Liquid Administration via: Straw;Cup   Medication Administration: Whole meds with liquid   Supervision: Patient able to self feed;Intermittent supervision to cue for compensatory strategies   Compensations: Slow rate;Small sips/bites   Postural Changes: Seated upright at 90 degrees   Oral Care Recommendations: Oral care BID      [place] Wania Longstreth SLP Student Clinician  [place] Betzaira Mentel 11/30/2017,3:03 PM

## 2017-12-01 ENCOUNTER — Inpatient Hospital Stay (HOSPITAL_COMMUNITY): Payer: Medicare HMO

## 2017-12-01 DIAGNOSIS — J690 Pneumonitis due to inhalation of food and vomit: Principal | ICD-10-CM

## 2017-12-01 DIAGNOSIS — N183 Chronic kidney disease, stage 3 (moderate): Secondary | ICD-10-CM

## 2017-12-01 LAB — CBC WITH DIFFERENTIAL/PLATELET
BASOS ABS: 0 10*3/uL (ref 0.0–0.1)
Basophils Relative: 1 %
Eosinophils Absolute: 0.2 10*3/uL (ref 0.0–0.7)
Eosinophils Relative: 3 %
HEMATOCRIT: 28.4 % — AB (ref 36.0–46.0)
HEMOGLOBIN: 8.7 g/dL — AB (ref 12.0–15.0)
LYMPHS PCT: 42 %
Lymphs Abs: 2.5 10*3/uL (ref 0.7–4.0)
MCH: 27.9 pg (ref 26.0–34.0)
MCHC: 30.6 g/dL (ref 30.0–36.0)
MCV: 91 fL (ref 78.0–100.0)
MONO ABS: 0.5 10*3/uL (ref 0.1–1.0)
Monocytes Relative: 9 %
NEUTROS ABS: 2.7 10*3/uL (ref 1.7–7.7)
Neutrophils Relative %: 45 %
Platelets: 229 10*3/uL (ref 150–400)
RBC: 3.12 MIL/uL — ABNORMAL LOW (ref 3.87–5.11)
RDW: 15.3 % (ref 11.5–15.5)
WBC: 5.9 10*3/uL (ref 4.0–10.5)

## 2017-12-01 LAB — BASIC METABOLIC PANEL
ANION GAP: 11 (ref 5–15)
BUN: 31 mg/dL — ABNORMAL HIGH (ref 6–20)
CHLORIDE: 103 mmol/L (ref 101–111)
CO2: 26 mmol/L (ref 22–32)
Calcium: 8.7 mg/dL — ABNORMAL LOW (ref 8.9–10.3)
Creatinine, Ser: 1.3 mg/dL — ABNORMAL HIGH (ref 0.44–1.00)
GFR calc Af Amer: 43 mL/min — ABNORMAL LOW (ref >60)
GFR calc non Af Amer: 37 mL/min — ABNORMAL LOW (ref >60)
GLUCOSE: 80 mg/dL (ref 65–99)
POTASSIUM: 4.1 mmol/L (ref 3.5–5.1)
Sodium: 140 mmol/L (ref 135–145)

## 2017-12-01 LAB — MAGNESIUM: Magnesium: 1.9 mg/dL (ref 1.7–2.4)

## 2017-12-01 LAB — GLUCOSE, CAPILLARY: Glucose-Capillary: 93 mg/dL (ref 65–99)

## 2017-12-01 MED ORDER — AMOXICILLIN-POT CLAVULANATE 875-125 MG PO TABS: 1.0000 | ORAL_TABLET | Freq: Two times a day (BID) | ORAL | 0 refills | Status: AC

## 2017-12-01 MED ORDER — IPRATROPIUM-ALBUTEROL 0.5-2.5 (3) MG/3ML IN SOLN: 3.0000 mL | Freq: Two times a day (BID) | RESPIRATORY_TRACT | Status: DC

## 2017-12-01 MED ORDER — GUAIFENESIN ER 600 MG PO TB12: 600.0000 mg | ORAL_TABLET | Freq: Two times a day (BID) | ORAL | 0 refills | Status: AC

## 2017-12-01 NOTE — Discharge Summary (Signed)
Physician Discharge Summary  [name] PNT:[id number] DOB: 27-Jun-1934 DOA: 11/28/2017  PCP: [name], PA  Admit date: 11/28/2017 Discharge date: 12/01/2017  Admitted From: ALF Disposition: ALF  Home Health:Yes  Discharge Condition:Stable CODE STATUS:Full Diet recommendation: Heart Healthy /Dysphagia   Brief/Interim Summary:  Patient is a 84 year old female with past medical history of aspiration pneumonia, systolic heart failure with ejection fraction of 45% as per echocardiogram 2018, history of PE but not on anti-Coagulation, COPD who presented to the emergency department with complaint of worsening shortness of breath, wheezing, productive cough and runny nose.  Patient was recently discharged on 11/02/17 after she was admitted for acute congestive heart failure. Patient was suspected to have Aspiration pneumonia on presentation and was admitted for further management. Patient was started on IV antibiotics.  Her respiratory status gradually improved.  Currently she is saturating fine on room air.  Patient was evaluated physical therapy and recommended home health PT on discharge. Patient is stable to be discharged to ALF today.  She will continue on oral antibiotics to continue the course.  Following problems were addressed during her hospitalization:   Aspiration pneumonia: Patient has history of aspiration pneumonia in the past.  Chest x-ray on presentation showed mild left basilar atelectasis versus infiltrates. Patient was started on Unasyn.She is afebrile Speech therapy evaluated  patient and recommended on dysphagia 3, thin liquid.  We recommend to continue aspiration precaution at ALF. Chest x-ray done this morning was suggestive of left lower lobe atelectasis.  No pulmonary edema. Continue augmentin on discharge.  Dyspnea: Likely combination of aspiration pneumonia and CHF exacerbation.  Patient presented with cough.  She had crackles bilaterally on examination.   Respiratory status is much stable now.  Saturating fine on room air.  Systolic CHF: Was recently admitted for the management of CHF.  Her ejection fraction is 45% as per the echocardiogram in 2018.  She has bilateral basal crackles.  BNP was mildly elevated on presentation.  Seems to be noncompliant on her medication.  She needs to be on salt restricted diet and her fluids would be associated to less than 1.5 L a day. Patient is on Lasix 40 mg daily at home. She was Started on IV Lasix here. Chest x-ray done this morning did not show any pulmonary edema.  History of coronary artery disease: Status post ICD/pacemaker.  Continue aspirin and statin.  Continue Coreg  History of hypothyroidism: Continue Synthyroid  History of colon cancer: Currently stable.  S/P colostomy  COPD: Continue nebulizer treatment.  Currently she is not wheezing.   CKD stage III: Her baseline creatinine ranges from 1.2-1.4.  Currently kidney function is at baseline.    Discharge Diagnoses:  Principal Problem:   Pneumonia Active Problems:   Dyslipidemia   CKD (chronic kidney disease) stage 3, GFR 30-59 ml/min (HCC)   Benign essential HTN   Hypothyroidism, adult   Chronic systolic CHF (congestive heart failure) [facility])    Discharge Instructions  Discharge Instructions    Diet - low sodium heart healthy   Complete by:  As directed    Dysphagia 3(mechanical soft),thin liquid Slow rate;Small sips/bites   Discharge instructions   Complete by:  As directed    1) Follow up with your PMD in a week. 2) Take prescribed medication as instructed 3) Continue aspiration precautions. Take mechanical soft diet .   Increase activity slowly   Complete by:  As directed      Allergies as of 12/01/2017   No Known Allergies  Medication List    STOP taking these medications   tamsulosin 0.4 MG Caps capsule Commonly known as:  FLOMAX     TAKE these medications   acetaminophen 500 MG tablet Commonly known  as:  TYLENOL Take 1,000 mg by mouth 3 (three) times daily.   albuterol (2.5 MG/3ML) 0.083% nebulizer solution Commonly known as:  PROVENTIL Take 3 mLs (2.5 mg total) by nebulization every 4 (four) hours as needed for wheezing.   amoxicillin-clavulanate 875-125 MG tablet Commonly known as:  AUGMENTIN Take 1 tablet by mouth every 12 (twelve) hours for 4 days. Start taking on:  12/02/2017   aspirin EC 81 MG tablet Take 1 tablet (81 mg total) by mouth every morning.   atorvastatin 20 MG tablet Commonly known as:  LIPITOR Take 20 mg by mouth daily.   benzonatate 100 MG capsule Commonly known as:  TESSALON Take 1 capsule (100 mg total) by mouth 3 (three) times daily as needed for cough.   carvedilol 3.125 MG tablet Commonly known as:  COREG Take 2 tablets (6.25 mg total) by mouth 2 (two) times daily with a meal.   diclofenac sodium 1 % Gel Commonly known as:  VOLTAREN Apply 2 g topically 4 (four) times daily.   FERREX 150 150 MG capsule Generic drug:  iron polysaccharides Take 150 mg by mouth 2 (two) times daily.   Fluticasone-Salmeterol 500-50 MCG/DOSE Aepb Commonly known as:  ADVAIR Inhale 1 puff into the lungs 2 (two) times daily. Rinse mouth after use   furosemide 40 MG tablet Commonly known as:  LASIX Take 1 tablet (40 mg total) by mouth daily.   guaiFENesin 600 MG 12 hr tablet Commonly known as:  MUCINEX Take 1 tablet (600 mg total) by mouth 2 (two) times daily for 7 days.   hydrALAZINE 25 MG tablet Commonly known as:  APRESOLINE Take 0.5 tablets (12.5 mg total) by mouth 3 (three) times daily.   isosorbide mononitrate 30 MG 24 hr tablet Commonly known as:  IMDUR Take 1 tablet (30 mg total) by mouth daily.   levothyroxine 50 MCG tablet Commonly known as:  SYNTHROID, LEVOTHROID Take 50 mcg by mouth daily.   lisinopril 10 MG tablet Commonly known as:  PRINIVIL,ZESTRIL Take 1 tablet (10 mg total) by mouth daily.   loratadine 10 MG tablet Commonly known as:   CLARITIN Take 10 mg by mouth daily.   oxymetazoline 0.05 % nasal spray Commonly known as:  AFRIN Place 1 spray into both nostrils 2 (two) times daily as needed (for nose bleeds). Reported on 12/23/2015   potassium chloride 10 MEQ tablet Commonly known as:  K-DUR Take 1 tablet (10 mEq total) by mouth daily.   ranitidine 150 MG tablet Commonly known as:  ZANTAC Take 150 mg by mouth daily.   REFRESH 1 % ophthalmic solution Generic drug:  carboxymethylcellulose Place 1 drop into both eyes 3 (three) times daily.      Follow-up Information    [name], [place]. Schedule an appointment as soon as possible for a visit in 1 week(s).   Specialty:  Internal Medicine Contact information: [address] [place] [address] [phone]          No Known Allergies  Consultations: None  Procedures/Studies: Dg Chest 2 View  Result Date: 12/01/2017 CLINICAL DATA:  Dyspnea.  History of CHF, diabetes and hypertension. EXAM: CHEST  2 VIEW COMPARISON:  11/28/2017 FINDINGS: There is opacity at the lung bases which has mildly increased from the  prior study, most likely due to atelectasis in combination with chronic bronchitic change. Remainder of the lungs is clear. No evidence of pulmonary edema. No pleural effusion or pneumothorax. Cardiac silhouette is normal in size. No mediastinal or hilar masses. No convincing adenopathy. Left anterior chest wall AICD is stable. Skeletal structures are demineralized but intact. IMPRESSION: 1. Lung base opacity has mildly increased from the most recent prior study. This is most likely atelectasis. Pneumonia is not excluded but felt less likely. No evidence of pulmonary edema. No other change. Electronically Signed   By: Lajean Manes M.D.   On: 12/01/2017 11:30   Dg Chest 2 View  Result Date: 11/28/2017 CLINICAL DATA:  Cough, shortness of breath. EXAM: CHEST  2 VIEW COMPARISON:  Radiographs of October 30, 2017. FINDINGS: Stable cardiomediastinal  silhouette. No pneumothorax or pleural effusion is noted. Atherosclerosis of thoracic aorta is noted. Left-sided pacemaker is unchanged in position. Right lung is clear. Possible mild left basilar atelectasis or infiltrate is noted. Bony thorax is unremarkable. IMPRESSION: Possible mild left basilar atelectasis or infiltrate is noted. Aortic Atherosclerosis (ICD10-I70.0). Electronically Signed   By: Marijo Conception, M.D.   On: 11/28/2017 17:13   Dg Swallowing Func-speech Pathology  Result Date: 11/30/2017 Objective Swallowing Evaluation: Type of Study: MBS-Modified Barium Swallow Study  Patient Details Name: [name] MRN: [id number] Date of Birth: [birth year] Today's Date: 11/30/2017 Time: SLP Start Time (ACUTE ONLY): 1401 -SLP Stop Time (ACUTE ONLY): 1418 SLP Time Calculation (min) (ACUTE ONLY): 17 min Past Medical History: Past Medical History: Diagnosis Date . Anginal pain ([place])  . Arthritis   "comes and goes" (03/01/2015) . Asthma  . CHF (congestive heart failure) ([place])  . Colon cancer ([place])  . Colostomy care ([place])  . COPD (chronic obstructive pulmonary disease) ([place])  . Diabetes mellitus without complication ([place])   pt denies this hx on 03/01/2015 . Heart murmur  . History of blood transfusion 03/01/2015; 04/23/2017  "related to anemia," . Hyperlipidemia  . Hypertension  . Hypothyroidism  . Pneumonia   "just once" (03/01/2015) . Pneumonia 04/23/2017 . Presence of combination internal cardiac defibrillator (ICD) and pacemaker   2005 in [facility] . Pulmonary embolism ([place])  . Renal disorder  Past Surgical History: Past Surgical History: Procedure Laterality Date . ABDOMINAL HYSTERECTOMY   . ANKLE FRACTURE SURGERY Left  . CARDIAC CATHETERIZATION N/A 03/22/2015  Procedure: Right/Left Heart Cath and Coronary Angiography;  Surgeon: Leonie Man, MD;  Location: [facility];  Service: Cardiovascular;  Laterality: N/A; . CATARACT EXTRACTION W/ INTRAOCULAR LENS  IMPLANT, BILATERAL Bilateral  . CHOLECYSTECTOMY   .  COLON SURGERY    cancer . DILATION AND CURETTAGE OF UTERUS   . FRACTURE SURGERY   . TUBAL LIGATION   HPI: Pooja Camuso Hughesis a 84 y.o.femalewith medical history significant ofaspiration PNA, Systolic HF, PE, COPD, patient recently discharge from hospital on 11-02-2017 after been treated for acute systolic HF, Aspiration PNA, patient presents today complaining of worsening SOB, wheezing, productive cough, runny nose since 2 days prior to admission. CXR Possible mild left basilar atelectasis or infiltrate is noted. Normal esophagram 10/30/17. BSE 04/2017 suspected esophageal dysphagia, reg/thin recommended. BSE (11/29/2017) revealed no overt signs or symptoms of dysphagia.  Subjective: Pt alert and cooperative throughout session Assessment / Plan / Recommendation CHL IP CLINICAL IMPRESSIONS 11/30/2017 Clinical Impression Pt presents with mild oropharyngeal dysphagia. Pt had Mod oral residue throughout trials, benefiting from [place] verbal cues to take consecutive sips and use of liquid  wash to aid oral clearance. Mastication deficits related to lack of dentures noted with solid trials. Pt had minimal flash penetration episode X1 with thin liquid without other incidence of airway compromise. Pt demonstrated airway protection with and without use of chin tuck strategy. Recommend Dys 3 (mech soft) and thin liquid diet and aspiration precuations. Given observed swallow safety and compliance with aspiration precautions/compensatory strategies, SLP services not indicated. Please re-consult in the event of acute changes.  SLP Visit Diagnosis Dysphagia, unspecified (R13.10) Attention and concentration deficit following -- Frontal lobe and executive function deficit following -- Impact on safety and function Mild aspiration risk   CHL IP TREATMENT RECOMMENDATION 11/30/2017 Treatment Recommendations No treatment recommended at this time   Prognosis 10/29/2017 Prognosis for Safe Diet Advancement Good Barriers to Reach Goals --  Barriers/Prognosis Comment -- CHL IP DIET RECOMMENDATION 11/30/2017 SLP Diet Recommendations Dysphagia 3 (Mech soft) solids;Thin liquid Liquid Administration via Straw;Cup Medication Administration Whole meds with liquid Compensations Slow rate;Small sips/bites Postural Changes Seated upright at 90 degrees   CHL IP OTHER RECOMMENDATIONS 11/30/2017 Recommended Consults -- Oral Care Recommendations Oral care BID Other Recommendations --   CHL IP FOLLOW UP RECOMMENDATIONS 11/30/2017 Follow up Recommendations None   CHL IP FREQUENCY AND DURATION 10/29/2017 Speech Therapy Frequency (ACUTE ONLY) min 1 x/week Treatment Duration 1 week;2 weeks      CHL IP ORAL PHASE 11/30/2017 Oral Phase Impaired Oral - Pudding Teaspoon -- Oral - Pudding Cup -- Oral - Honey Teaspoon -- Oral - Honey Cup -- Oral - Nectar Teaspoon -- Oral - Nectar Cup -- Oral - Nectar Straw -- Oral - Thin Teaspoon -- Oral - Thin Cup -- Oral - Thin Straw Lingual/palatal residue;Premature spillage Oral - Puree Weak lingual manipulation;Lingual/palatal residue Oral - Mech Soft -- Oral - Regular Weak lingual manipulation;Lingual/palatal residue;Lingual pumping;Impaired mastication Oral - Multi-Consistency -- Oral - Pill Lingual pumping;Lingual/palatal residue;Weak lingual manipulation;Reduced posterior propulsion Oral Phase - Comment --  CHL IP PHARYNGEAL PHASE 11/30/2017 Pharyngeal Phase Impaired Pharyngeal- Pudding Teaspoon -- Pharyngeal -- Pharyngeal- Pudding Cup -- Pharyngeal -- Pharyngeal- Honey Teaspoon -- Pharyngeal -- Pharyngeal- Honey Cup -- Pharyngeal -- Pharyngeal- Nectar Teaspoon -- Pharyngeal -- Pharyngeal- Nectar Cup -- Pharyngeal -- Pharyngeal- Nectar Straw -- Pharyngeal -- Pharyngeal- Thin Teaspoon -- Pharyngeal -- Pharyngeal- Thin Cup -- Pharyngeal -- Pharyngeal- Thin Straw Penetration/Aspiration during swallow;Reduced tongue base retraction;Pharyngeal residue - valleculae;Compensatory strategies attempted (with notebox) Pharyngeal Material enters  airway, remains ABOVE vocal cords then ejected out Pharyngeal- Puree Pharyngeal residue - valleculae;Reduced tongue base retraction Pharyngeal -- Pharyngeal- Mechanical Soft -- Pharyngeal -- Pharyngeal- Regular Reduced tongue base retraction Pharyngeal -- Pharyngeal- Multi-consistency -- Pharyngeal -- Pharyngeal- Pill Reduced tongue base retraction Pharyngeal -- Pharyngeal Comment --  CHL IP CERVICAL ESOPHAGEAL PHASE 11/30/2017 Cervical Esophageal Phase WFL Pudding Teaspoon -- Pudding Cup -- Honey Teaspoon -- Honey Cup -- Nectar Teaspoon -- Nectar Cup -- Nectar Straw -- Thin Teaspoon -- Thin Cup -- Thin Straw -- Puree -- Mechanical Soft -- Regular -- Multi-consistency -- Pill -- Cervical Esophageal Comment -- CHL IP GO 02/03/2016 Functional Assessment Tool Used clinican judgement Functional Limitations Swallowing Swallow Current Status (H7026) [facility] Swallow Goal Status (V7858) [facility] Swallow Discharge Status (I5027) [place] Motor Speech Current Status (X4128) (None) Motor Speech Goal Status (N8676) (None) Motor Speech Goal Status (H2094) (None) Spoken Language Comprehension Current Status (B0962) (None) Spoken Language Comprehension Goal Status (E3662) (None) Spoken Language Comprehension Discharge Status (H4765) (None) Spoken Language Expression Current Status (Y6503) (None) Spoken Language Expression Goal Status (  G9163) (None) Spoken Language Expression Discharge Status [phone]) (None) Attention Current Status [phone]) (None) Attention Goal Status (U9811) (None) Attention Discharge Status [phone]) (None) Memory Current Status (G9562) (None) Memory Goal Status (Z3086) (None) Memory Discharge Status (V7846) (None) Voice Current Status (N6295) (None) Voice Goal Status (M8413) (None) Voice Discharge Status (K4401) (None) Other Speech-Language Pathology Functional Limitation Current Status (U2725) (None) Other Speech-Language Pathology Functional Limitation Goal Status (D6644) (None) Other Speech-Language Pathology Functional  Limitation Discharge Status [phone]) (None) Germain Osgood 11/30/2017, 3:36 PM  Note populated for [place] Jarrett, Student SLP Germain Osgood, M.A. CCC-SLP [phone]             Dg Hip Unilat With Pelvis 2-3 Views Right  Result Date: 11/01/2017 CLINICAL DATA:  Right hip pain with no known injury. History of arthritis, colonic malignancy, and diabetes. EXAM: DG HIP (WITH OR WITHOUT PELVIS) 2-3V RIGHT COMPARISON:  Right hip series of April 28, 2017 FINDINGS: The bony pelvis is subjectively adequately mineralized. There is no lytic nor blastic lesion nor acute or old fracture. AP and lateral views of the right hip reveal mild symmetric narrowing of the joint space. The articular surfaces of the right femoral head and acetabulum remains smoothly rounded. The femoral neck, intertrochanteric, and subtrochanteric regions are normal. IMPRESSION: There is no acute bony abnormality of the right hip. Mild symmetric osteoarthritic joint space loss. Electronically Signed   By: David  Martinique M.D.   On: 11/01/2017 14:58    (Echo, Carotid, EGD, Colonoscopy, ERCP)    Subjective: Patient seen and examined the bedside this morning.  Remains comfortable.  Was not in any respiratory distress.  Saturating fine on room air. I discussed the discharge planning with the patient and with her son on phone.  Discharge Exam: Vitals:   12/01/17 0725 12/01/17 1338  BP:  (!) 127/53  Pulse:  77  Resp:  17  Temp:  98.8 F (37.1 C)  SpO2: 94% 100%   Vitals:   12/01/17 0500 12/01/17 0546 12/01/17 0725 12/01/17 1338  BP:  (!) 139/51  (!) 127/53  Pulse:  76  77  Resp:  17  17  Temp:  98.5 F (36.9 C)  98.8 F (37.1 C)  TempSrc:  Oral  Oral  SpO2:  99% 94% 100%  Weight: 99.3 kg (218 lb 14.7 oz)     Height:        General: Pt is alert, awake, not in acute distress Cardiovascular: RRR, S1/S2 +, no rubs, no gallops Respiratory: Bilateral basal crackles Abdominal: Soft, NT, ND, bowel sounds + Extremities: Trace  edema, no cyanosis    The results of significant diagnostics from this hospitalization (including imaging, microbiology, ancillary and laboratory) are listed below for reference.     Microbiology: Recent Results (from the past 240 hour(s))  Blood Culture (routine x 2)     Status: None (Preliminary result)   Collection Time: 11/28/17  6:52 PM  Result Value Ref Range Status   Specimen Description   Final    BLOOD RIGHT ANTECUBITAL Performed at [facility] [address] [place], [place] [address]    Special Requests   Final    BOTTLES DRAWN AEROBIC AND ANAEROBIC Blood Culture results may not be optimal due to an inadequate volume of blood received in culture bottles Performed at [facility] [address] [place], [place] [address]    Culture   Final    NO GROWTH 3 DAYS Performed at [facility], [place]  [address], [place], [place] [address]    Report Status PENDING  Incomplete  MRSA PCR Screening     Status: None   Collection Time: 11/28/17  8:18 PM  Result Value Ref Range Status   MRSA by PCR NEGATIVE NEGATIVE Final    Comment:        The GeneXpert MRSA Assay (FDA approved for NASAL specimens only), is one component of a comprehensive MRSA colonization surveillance program. It is not intended to diagnose MRSA infection nor to guide or monitor treatment for MRSA infections. Performed at [facility], [place] [address] [place], [place] [address]   Blood Culture (routine x 2)     Status: None (Preliminary result)   Collection Time: 11/28/17  8:36 PM  Result Value Ref Range Status   Specimen Description   Final    BLOOD RIGHT HAND Performed at [facility] [address] [place], [place] [address]    Special Requests   Final    IN PEDIATRIC BOTTLE Blood Culture adequate volume Performed at [facility] [address], [place], [place] [address]    Culture   Final     NO GROWTH 2 DAYS Performed at [facility] [address] [place],  [address]    Report Status PENDING  Incomplete     Labs: BNP (last 3 results) Recent Labs    04/22/17 0059 10/28/17 0423 11/28/17 1623  BNP 286.1* 208.1* 462.7*   Basic Metabolic Panel: Recent Labs  Lab 11/28/17 1622 11/29/17 0236 11/30/17 0517 12/01/17 0551  NA 137 139 141 140  K 4.4 4.2 4.1 4.1  CL 101 102 104 103  CO2 26 24 25 26   GLUCOSE 86 118* 83 80  BUN 30* 27* 33* 31*  CREATININE 1.38* 1.32* 1.41* 1.30*  CALCIUM 9.4 9.5 8.9 8.7*  MG  --   --   --  1.9   Liver Function Tests: Recent Labs  Lab 11/28/17 1622  AST 45*  ALT 40  ALKPHOS 79  BILITOT 0.3  PROT 8.0  ALBUMIN 4.2   No results for input(s): LIPASE, AMYLASE in the last 168 hours. No results for input(s): AMMONIA in the last 168 hours. CBC: Recent Labs  Lab 11/28/17 1622 11/29/17 0236 12/01/17 0551  WBC 5.2 5.6 5.9  NEUTROABS 2.8  --  2.7  HGB 9.2* 9.3* 8.7*  HCT 29.9* 29.7* 28.4*  MCV 91.2 90.8 91.0  PLT 224 235 229   Cardiac Enzymes: Recent Labs  Lab 11/28/17 2118 11/29/17 0236  TROPONINI 0.03* 0.03*   BNP: Invalid input(s): POCBNP CBG: Recent Labs  Lab 12/01/17 1140  GLUCAP 93   D-Dimer No results for input(s): DDIMER in the last 72 hours. Hgb A1c No results for input(s): HGBA1C in the last 72 hours. Lipid Profile No results for input(s): CHOL, HDL, LDLCALC, TRIG, CHOLHDL, LDLDIRECT in the last 72 hours. Thyroid function studies No results for input(s): TSH, T4TOTAL, T3FREE, THYROIDAB in the last 72 hours.  Invalid input(s): FREET3 Anemia work up No results for input(s): VITAMINB12, FOLATE, FERRITIN, TIBC, IRON, RETICCTPCT in the last 72 hours. Urinalysis    Component Value Date/Time   COLORURINE YELLOW 10/28/2017 [place] 10/28/2017 0539   LABSPEC 1.010 10/28/2017 [place] 7.0 10/28/2017 [place] 10/28/2017 0539   HGBUR NEGATIVE 10/28/2017  [place] 10/28/2017 [place] 10/28/2017 0539   PROTEINUR NEGATIVE 10/28/2017 0539  UROBILINOGEN 0.2 07/23/2015 1610   NITRITE NEGATIVE 10/28/2017 0539   LEUKOCYTESUR NEGATIVE 10/28/2017 0539   Sepsis Labs Invalid input(s): PROCALCITONIN,  WBC,  LACTICIDVEN Microbiology Recent Results (from the past 240 hour(s))  Blood Culture (routine x 2)     Status: None (Preliminary result)   Collection Time: 11/28/17  6:52 PM  Result Value Ref Range Status   Specimen Description   Final    BLOOD RIGHT ANTECUBITAL Performed at [facility] [address] [place], [place] [address]    Special Requests   Final    BOTTLES DRAWN AEROBIC AND ANAEROBIC Blood Culture results may not be optimal due to an inadequate volume of blood received in culture bottles Performed at [facility] [address], [place], [place] [address]    Culture   Final    NO GROWTH 3 DAYS Performed at [facility], [place] [address] [place], [place] [address]    Report Status PENDING  Incomplete  MRSA PCR Screening     Status: None   Collection Time: 11/28/17  8:18 PM  Result Value Ref Range Status   MRSA by PCR NEGATIVE NEGATIVE Final    Comment:        The GeneXpert MRSA Assay (FDA approved for NASAL specimens only), is one component of a comprehensive MRSA colonization surveillance program. It is not intended to diagnose MRSA infection nor to guide or monitor treatment for MRSA infections. Performed at [facility], [place] [address], [place], [place] [address]   Blood Culture (routine x 2)     Status: None (Preliminary result)   Collection Time: 11/28/17  8:36 PM  Result Value Ref Range Status   Specimen Description   Final    BLOOD RIGHT HAND Performed at [facility] [address] [place], [place] [address]    Special Requests   Final    IN PEDIATRIC BOTTLE Blood Culture adequate  volume Performed at [facility] [address] [place], [place] [address]    Culture   Final    NO GROWTH 2 DAYS Performed at [facility] [address] [place],  [address]    Report Status PENDING  Incomplete     Time coordinating discharge: Over 30 minutes  SIGNED:   Marene Lenz, MD  [facility] 12/01/2017, 2:26 PM Pager [phone]  If 7PM-7AM, please contact night-coverage [email or web address] Password [email or web address]

## 2017-12-01 NOTE — Clinical Social Work Note (Signed)
Patient medically stable for discharge and will return to [facility]F. CSW talked with [name] at the facility regarding patient's readiness for discharge. Discharge clinicals transmitted to facility. Son, [name] contacted [phone]) and informed of discharge and ambulance transport.   Chelsy Parrales Givens, MSW, LCSW Licensed Clinical Social Worker [place] [phone]

## 2017-12-01 NOTE — Care Management Important Message (Signed)
Important Message  Patient Details  Name: [name] MRN: [id number] Date of Birth: 1934-06-03   Medicare Important Message Given:  Yes    Erenest Rasher, RN 12/01/2017, 2:52 PM

## 2017-12-01 NOTE — NC FL2 (Addendum)
[place] LEVEL OF CARE SCREENING TOOL     IDENTIFICATION  Patient Name: [name] Birthdate: January 21, 1934 Sex: female Admission Date (Current Location): 11/28/2017  [place] and [place] Number:  Kathleen Argue [id number] [place] and Address:  [facility],  [place] [address] [place]      Provider Number: [id number]  Attending Physician Name and Address:  Marene Lenz, MD  Relative Name and Phone Number:  [name] - son; [phone]    Current Level of Care: Hospital Recommended Level of Care: Assisted Living [facility]) Prior Approval Number:    Date Approved/Denied:   PASRR Number:    Discharge Plan: Other (Comment)(ALF)    Current Diagnoses: Patient Active Problem List   Diagnosis Date Noted  . Chest pain 10/28/2017  . Aspiration of liquid 10/28/2017  . Bilateral lower extremity edema   . Pneumonia 04/22/2017  . HCAP (healthcare-associated pneumonia) 04/22/2017  . Altered mental status   . Hypotension 02/16/2017  . Syncope 02/02/2016  . Faintness   . Essential hypertension 11/16/2015  . Hypothyroidism 11/16/2015  . Acute on chronic systolic CHF (congestive heart failure) ([place]) 11/15/2015  . Hypertensive heart disease 10/04/2015  . Elevated troponin 10/04/2015  . Acute respiratory failure ([place])   . Acute systolic congestive heart failure ([employer])   . NICM (nonischemic cardiomyopathy) ([place]) 08/11/2015  . Normal coronary arteries 08/11/2015  . Dyslipidemia 07/27/2015  . CKD (chronic kidney disease) stage 3, GFR 30-59 ml/min (HCC) 07/27/2015  . Anemia 07/27/2015  . Benign essential HTN 07/27/2015  . Hypothyroidism, adult 07/27/2015  . FUO (fever of unknown origin) 07/27/2015  . IDA (iron deficiency anemia) 07/27/2015  . Chronic systolic CHF (congestive heart failure) ([place]) 07/27/2015  . Colon cancer ([place]) 07/27/2015  . Sepsis ([place]) 07/24/2015  . Acute encephalopathy 07/24/2015  . Hypokalemia 07/24/2015   . Dementia 07/23/2015  . COPD (chronic obstructive pulmonary disease) ([place]) 04/25/2015  . ICD in place 04/14/2015  . Colostomy in place [facility]) 03/01/2015    Orientation RESPIRATION BLADDER Height & Weight     Self, Time, Situation, Place    Incontinent Weight: 218 lb 14.7 oz (99.3 kg) Height:  5\' 6"  (167.6 cm)  BEHAVIORAL SYMPTOMS/MOOD NEUROLOGICAL BOWEL NUTRITION STATUS      Colostomy Diet: Regular  AMBULATORY STATUS COMMUNICATION OF NEEDS Skin   Limited Assist Verbally Other (Comment)(MASD to abdomen and perineum)                       Personal Care Assistance Level of Assistance  Bathing, Feeding, Dressing Bathing Assistance: Limited assistance Feeding assistance: Independent Dressing Assistance: Limited assistance     Functional Limitations Info  Sight, Hearing, Speech Sight Info: Adequate Hearing Info: Adequate Speech Info: Adequate    SPECIAL CARE FACTORS FREQUENCY  PT (By licensed PT), Speech therapy     PT Frequency: Evaluated 2/23       Speech Therapy Frequency: Evaluated 2/21      Contractures Contractures Info: Not present    Additional Factors Info  Code Status, Allergies Code Status Info: Full Allergies Info: No know allergies           Current Medications (12/01/2017):  This is the current hospital active medication list Current Facility-Administered Medications  Medication Dose Route Frequency Provider Last Rate Last Dose  . 0.9 %  sodium chloride infusion  250 mL Intravenous PRN Regalado, Belkys A, MD      . acetaminophen (TYLENOL) tablet 650 mg  650 mg  Oral Q6H PRN Regalado, Belkys A, MD       Or  . acetaminophen (TYLENOL) suppository 650 mg  650 mg Rectal Q6H PRN Regalado, Belkys A, MD      . albuterol (PROVENTIL) (2.5 MG/3ML) 0.083% nebulizer solution 2.5 mg  2.5 mg Nebulization Q2H PRN Regalado, Belkys A, MD      . Ampicillin-Sulbactam (UNASYN) 3 g in sodium chloride 0.9 % 100 mL IVPB  3 g Intravenous Q6H Berton Mount, RPH    Stopped at 12/01/17 1305  . aspirin EC tablet 81 mg  81 mg Oral Daily Regalado, Belkys A, MD   81 mg at 12/01/17 1100  . atorvastatin (LIPITOR) tablet 20 mg  20 mg Oral Daily Regalado, Belkys A, MD   20 mg at 12/01/17 1100  . benzonatate (TESSALON) capsule 100 mg  100 mg Oral TID PRN Regalado, Belkys A, MD   100 mg at 11/29/17 2113  . carvedilol (COREG) tablet 6.25 mg  6.25 mg Oral BID WC Regalado, Belkys A, MD   6.25 mg at 12/01/17 0744  . famotidine (PEPCID) tablet 10 mg  10 mg Oral Daily Regalado, Belkys A, MD   10 mg at 12/01/17 1100  . furosemide (LASIX) injection 40 mg  40 mg Intravenous Daily Regalado, Belkys A, MD   40 mg at 12/01/17 1100  . guaiFENesin (MUCINEX) 12 hr tablet 600 mg  600 mg Oral BID Jodie Echevaria, Amrit, MD   600 mg at 12/01/17 1100  . heparin injection 5,000 Units  5,000 Units Subcutaneous Q8H Marene Lenz, MD   5,000 Units at 12/01/17 1438  . ipratropium-albuterol (DUONEB) 0.5-2.5 (3) MG/3ML nebulizer solution 3 mL  3 mL Nebulization BID Adhikari Bk, Amrit, MD      . iron polysaccharides (NIFEREX) capsule 150 mg  150 mg Oral BID Regalado, Belkys A, MD   150 mg at 12/01/17 1100  . isosorbide mononitrate (IMDUR) 24 hr tablet 30 mg  30 mg Oral Daily Regalado, Belkys A, MD   30 mg at 12/01/17 1100  . levothyroxine (SYNTHROID, LEVOTHROID) tablet 50 mcg  50 mcg Oral QAC breakfast Regalado, Belkys A, MD   50 mcg at 12/01/17 0744  . loratadine (CLARITIN) tablet 10 mg  10 mg Oral Daily Regalado, Belkys A, MD   10 mg at 12/01/17 1100  . mometasone-formoterol (DULERA) 200-5 MCG/ACT inhaler 2 puff  2 puff Inhalation BID Regalado, Belkys A, MD   2 puff at 12/01/17 0730  . ondansetron (ZOFRAN) tablet 4 mg  4 mg Oral Q6H PRN Regalado, Belkys A, MD       Or  . ondansetron (ZOFRAN) injection 4 mg  4 mg Intravenous Q6H PRN Regalado, Belkys A, MD      . oxymetazoline (AFRIN) 0.05 % nasal spray 1 spray  1 spray Each Nare BID PRN Regalado, Belkys A, MD      . polyvinyl alcohol (LIQUIFILM  TEARS) 1.4 % ophthalmic solution 1 drop  1 drop Both Eyes TID Regalado, Belkys A, MD   1 drop at 12/01/17 1100  . potassium chloride (K-DUR) CR tablet 10 mEq  10 mEq Oral Daily Regalado, Belkys A, MD   10 mEq at 12/01/17 1100  . senna-docusate (Senokot-S) tablet 1 tablet  1 tablet Oral QHS PRN Regalado, Belkys A, MD      . sodium chloride (OCEAN) 0.65 % nasal spray 1 spray  1 spray Each Nare PRN Regalado, Belkys A, MD      . sodium chloride flush (  NS) 0.9 % injection 3 mL  3 mL Intravenous Q12H Regalado, Belkys A, MD   3 mL at 12/01/17 1100  . sodium chloride flush (NS) 0.9 % injection 3 mL  3 mL Intravenous PRN Regalado, Belkys A, MD         Discharge Medications: Please see discharge summary for a list of discharge medications.  Relevant Imaging Results:  Relevant Lab Results:   Additional Information Discharge medications:  STOP taking these medications   tamsulosin 0.4 MG Caps capsule Commonly known as:  FLOMAX     TAKE these medications   acetaminophen 500 MG tablet Commonly known as:  TYLENOL Take 1,000 mg by mouth 3 (three) times daily.   albuterol (2.5 MG/3ML) 0.083% nebulizer solution Commonly known as:  PROVENTIL Take 3 mLs (2.5 mg total) by nebulization every 4 (four) hours as needed for wheezing.   amoxicillin-clavulanate 875-125 MG tablet Commonly known as:  AUGMENTIN Take 1 tablet by mouth every 12 (twelve) hours for 4 days. Start taking on:  12/02/2017   aspirin EC 81 MG tablet Take 1 tablet (81 mg total) by mouth every morning.   atorvastatin 20 MG tablet Commonly known as:  LIPITOR Take 20 mg by mouth daily.   benzonatate 100 MG capsule Commonly known as:  TESSALON Take 1 capsule (100 mg total) by mouth 3 (three) times daily as needed for cough.   carvedilol 3.125 MG tablet Commonly known as:  COREG Take 2 tablets (6.25 mg total) by mouth 2 (two) times daily with a meal.   diclofenac sodium 1 % Gel Commonly known as:  VOLTAREN Apply 2 g  topically 4 (four) times daily.   FERREX 150 150 MG capsule Generic drug:  iron polysaccharides Take 150 mg by mouth 2 (two) times daily.   Fluticasone-Salmeterol 500-50 MCG/DOSE Aepb Commonly known as:  ADVAIR Inhale 1 puff into the lungs 2 (two) times daily. Rinse mouth after use   furosemide 40 MG tablet Commonly known as:  LASIX Take 1 tablet (40 mg total) by mouth daily.   guaiFENesin 600 MG 12 hr tablet Commonly known as:  MUCINEX Take 1 tablet (600 mg total) by mouth 2 (two) times daily for 7 days.   hydrALAZINE 25 MG tablet Commonly known as:  APRESOLINE Take 0.5 tablets (12.5 mg total) by mouth 3 (three) times daily.   isosorbide mononitrate 30 MG 24 hr tablet Commonly known as:  IMDUR Take 1 tablet (30 mg total) by mouth daily.   levothyroxine 50 MCG tablet Commonly known as:  SYNTHROID, LEVOTHROID Take 50 mcg by mouth daily.   lisinopril 10 MG tablet Commonly known as:  PRINIVIL,ZESTRIL Take 1 tablet (10 mg total) by mouth daily.   loratadine 10 MG tablet Commonly known as:  CLARITIN Take 10 mg by mouth daily.   oxymetazoline 0.05 % nasal spray Commonly known as:  AFRIN Place 1 spray into both nostrils 2 (two) times daily as needed (for nose bleeds). Reported on 12/23/2015   potassium chloride 10 MEQ tablet Commonly known as:  K-DUR Take 1 tablet (10 mEq total) by mouth daily.   ranitidine 150 MG tablet Commonly known as:  ZANTAC Take 150 mg by mouth daily.   REFRESH 1 % ophthalmic solution Generic drug:  carboxymethylcellulose Place 1 drop into both eyes 3 (three) times daily.         Sable Feil, LCSW

## 2017-12-01 NOTE — Evaluation (Signed)
Physical Therapy Evaluation Patient Details Name: [name] MRN: [id number] DOB: [birth year] Today's Date: 12/01/2017   History of Present Illness  84 yo female admitted with worsening SOB, cough and runny nose.  She had recent discharge from hospital on 11/02/2017 for admission for CHF   Clinical Impression  Pt reports she is feeling better, sitting up in a chair.  She feels that she is ready to return to ALF with PT as prior to admission.  Feel she would benefit from continued PT to help her get stronger and increase her endurance to allow her to be more independent in facility activities    Follow Up Recommendations Home health PT(at ALF as prior to admission )    Equipment Recommendations  None recommended by PT    Recommendations for Other Services       Precautions / Restrictions Precautions Precautions: None Restrictions Weight Bearing Restrictions: Yes      Mobility  Bed Mobility Overal bed mobility: (pt up in a chair )                Transfers Overall transfer level: Needs assistance(extra time to stand , cues to push up with arms ) Equipment used: Rolling walker (2 wheeled)                Ambulation/Gait Ambulation/Gait assistance: Min guard Ambulation Distance (Feet): 75 Feet Assistive device: Rolling walker (2 wheeled) Gait Pattern/deviations: Step-to pattern;Decreased step length - right;Decreased step length - left Gait velocity: decreased      Stairs            Wheelchair Mobility    Modified [place] (Stroke Patients Only)       Balance Overall balance assessment: Modified Independent                                           Pertinent Vitals/Pain Pain Assessment: No/denies pain    Home Living                        Prior Function                 Hand Dominance        Extremity/Trunk Assessment                Communication      Cognition Arousal/Alertness:  Awake/alert Behavior During Therapy: WFL for tasks assessed/performed Overall Cognitive Status: Within Functional Limits for tasks assessed                                 General Comments: pt motivated to walk       General Comments General comments (skin integrity, edema, etc.): Pt has edema in both legs that she says has been ongoing     Exercises     Assessment/Plan    PT Assessment    PT Problem List         PT Treatment Interventions      PT Goals (Current goals can be found in the Care Plan section)  Acute Rehab PT Goals Patient Stated Goal: to go back to ALF     Frequency Min 2X/week   Barriers to discharge        Co-evaluation  AM-PAC PT "6 Clicks" Daily Activity  Outcome Measure Difficulty turning over in bed (including adjusting bedclothes, sheets and blankets)?: A Little Difficulty moving from lying on back to sitting on the side of the bed? : A Little Difficulty sitting down on and standing up from a chair with arms (e.g., wheelchair, bedside commode, etc,.)?: A Little Help needed moving to and from a bed to chair (including a wheelchair)?: A Little Help needed walking in hospital room?: A Little Help needed climbing 3-5 steps with a railing? : A Lot 6 Click Score: 17    End of Session Equipment Utilized During Treatment: Gait belt Activity Tolerance: Patient tolerated treatment well Patient left: in chair   PT Visit Diagnosis: Unsteadiness on feet (R26.81);Muscle weakness (generalized) (M62.81)    Time: 1240-1300 PT Time Calculation (min) (ACUTE ONLY): 20 min   Charges:   PT Evaluation $PT Eval Low Complexity: 1 Low     PT G Codes:        Cyril Woodmansee K. Owens Shark, PT   Norwood Levo 12/01/2017, 1:46 PM

## 2017-12-01 NOTE — Progress Notes (Signed)
Report attempted at [facility]

## 2017-12-02 NOTE — Care Management Note (Signed)
Case Management Note  Patient Details  Name: LUXE CUADROS MRN: [id number] Date of Birth: 1934/05/21  Subjective/Objective:  Asp PNA, Dyspnea, CHF                  Action/Plan: 12/01/2017 Spoke to pt and states she is receiving PT at ALF. Pt states she has RW at facility. CSW following for return back to ALF.  12/02/2017 [facility] # [phone] and she had [facility]. Contacted [facility] with resumption of care.   Expected Discharge Date:  12/01/17               Expected Discharge Plan:  Assisted Living / Rest Home  In-House Referral:  NA  Discharge planning Services  CM Consult  Post Acute Care Choice:  Home Health, Resumption of Svcs/PTA Provider Choice offered to:  Patient  DME Arranged:  N/A DME Agency:  NA  HH Arranged:  PT HH Agency:  NA  Status of Service:  Completed, signed off  If discussed at [place] of Stay Meetings, dates discussed:    Additional Comments:  Erenest Rasher, RN 12/02/2017, 11:02 AM

## 2017-12-03 LAB — CULTURE, BLOOD (ROUTINE X 2): Culture: NO GROWTH

## 2017-12-04 LAB — CULTURE, BLOOD (ROUTINE X 2)
CULTURE: NO GROWTH
Special Requests: ADEQUATE

## 2018-01-23 ENCOUNTER — Emergency Department (HOSPITAL_COMMUNITY)
Admission: EM | Admit: 2018-01-23 | Discharge: 2018-01-23 | Disposition: A | Discharge status: Home / Self Care | Payer: Medicare HMO | Attending: Emergency Medicine | Admitting: Emergency Medicine

## 2018-01-23 ENCOUNTER — Encounter (HOSPITAL_COMMUNITY): Payer: Self-pay | Admitting: Emergency Medicine

## 2018-01-23 ENCOUNTER — Emergency Department (HOSPITAL_COMMUNITY): Payer: Medicare HMO

## 2018-01-23 ENCOUNTER — Other Ambulatory Visit: Payer: Self-pay

## 2018-01-23 DIAGNOSIS — R600 Localized edema: Secondary | ICD-10-CM | POA: Diagnosis not present

## 2018-01-23 DIAGNOSIS — Z7982 Long term (current) use of aspirin: Secondary | ICD-10-CM | POA: Diagnosis not present

## 2018-01-23 DIAGNOSIS — E1122 Type 2 diabetes mellitus with diabetic chronic kidney disease: Secondary | ICD-10-CM | POA: Insufficient documentation

## 2018-01-23 DIAGNOSIS — I130 Hypertensive heart and chronic kidney disease with heart failure and stage 1 through stage 4 chronic kidney disease, or unspecified chronic kidney disease: Secondary | ICD-10-CM | POA: Diagnosis not present

## 2018-01-23 DIAGNOSIS — F0390 Unspecified dementia without behavioral disturbance: Secondary | ICD-10-CM | POA: Insufficient documentation

## 2018-01-23 DIAGNOSIS — Z79899 Other long term (current) drug therapy: Secondary | ICD-10-CM | POA: Insufficient documentation

## 2018-01-23 DIAGNOSIS — E039 Hypothyroidism, unspecified: Secondary | ICD-10-CM | POA: Insufficient documentation

## 2018-01-23 DIAGNOSIS — N183 Chronic kidney disease, stage 3 (moderate): Secondary | ICD-10-CM | POA: Insufficient documentation

## 2018-01-23 DIAGNOSIS — J449 Chronic obstructive pulmonary disease, unspecified: Secondary | ICD-10-CM | POA: Diagnosis not present

## 2018-01-23 DIAGNOSIS — I5023 Acute on chronic systolic (congestive) heart failure: Secondary | ICD-10-CM | POA: Diagnosis not present

## 2018-01-23 LAB — BASIC METABOLIC PANEL
ANION GAP: 10 (ref 5–15)
BUN: 30 mg/dL — ABNORMAL HIGH (ref 6–20)
CHLORIDE: 105 mmol/L (ref 101–111)
CO2: 25 mmol/L (ref 22–32)
Calcium: 9.2 mg/dL (ref 8.9–10.3)
Creatinine, Ser: 1.4 mg/dL — ABNORMAL HIGH (ref 0.44–1.00)
GFR calc non Af Amer: 34 mL/min — ABNORMAL LOW (ref >60)
GFR, EST AFRICAN AMERICAN: 40 mL/min — AB (ref >60)
GLUCOSE: 86 mg/dL (ref 65–99)
POTASSIUM: 4 mmol/L (ref 3.5–5.1)
Sodium: 140 mmol/L (ref 135–145)

## 2018-01-23 LAB — CBC WITH DIFFERENTIAL/PLATELET
BASOS ABS: 0 10*3/uL (ref 0.0–0.1)
Basophils Relative: 1 %
Eosinophils Absolute: 0.2 10*3/uL (ref 0.0–0.7)
Eosinophils Relative: 3 %
HEMATOCRIT: 30.1 % — AB (ref 36.0–46.0)
HEMOGLOBIN: 9.2 g/dL — AB (ref 12.0–15.0)
LYMPHS PCT: 38 %
Lymphs Abs: 2.1 10*3/uL (ref 0.7–4.0)
MCH: 28.2 pg (ref 26.0–34.0)
MCHC: 30.6 g/dL (ref 30.0–36.0)
MCV: 92.3 fL (ref 78.0–100.0)
MONO ABS: 0.4 10*3/uL (ref 0.1–1.0)
Monocytes Relative: 7 %
NEUTROS ABS: 2.9 10*3/uL (ref 1.7–7.7)
NEUTROS PCT: 51 %
Platelets: 204 10*3/uL (ref 150–400)
RBC: 3.26 MIL/uL — AB (ref 3.87–5.11)
RDW: 15.4 % (ref 11.5–15.5)
WBC: 5.5 10*3/uL (ref 4.0–10.5)

## 2018-01-23 LAB — I-STAT TROPONIN, ED: Troponin i, poc: 0.03 ng/mL (ref 0.00–0.08)

## 2018-01-23 LAB — BRAIN NATRIURETIC PEPTIDE: B Natriuretic Peptide: 215 pg/mL — ABNORMAL HIGH (ref 0.0–100.0)

## 2018-01-23 MED ORDER — FUROSEMIDE 10 MG/ML IJ SOLN
40.0000 mg | Freq: Once | INTRAMUSCULAR | Status: AC
  Administered 2018-01-23: 40 mg via INTRAVENOUS
  Filled 2018-01-23: qty 4

## 2018-01-23 NOTE — ED Provider Notes (Signed)
[facility] EMERGENCY DEPARTMENT Provider Note   CSN: [id number] Arrival date & time: 01/23/18  0231     History   Chief Complaint Chief Complaint  Patient presents with  . Swelling    HPI [name] is a 84 y.o. female.  HPI  This is an 84 year old female with a history of CHF, COPD, diabetes, hypertension, hyperlipidemia who presents from her living facility with concerns for lower extremity swelling and abdominal swelling.  Patient reports that over the last 24 hours she has had worsening swelling.  She denies any lower extremity pain.  She reports shortness of breath that woke her from sleep.  It is worse with lying flat.  She denies any chest pain.  She denies any recent cough or fevers.  She takes Lasix daily.  No recent changes in her medications.  Past Medical History:  Diagnosis Date  . Anginal pain ([place])   . Arthritis    "comes and goes" (03/01/2015)  . Asthma   . CHF (congestive heart failure) ([place])   . Colon cancer ([place])   . Colostomy care ([place])   . COPD (chronic obstructive pulmonary disease) ([place])   . Diabetes mellitus without complication ([place])    pt denies this hx on 03/01/2015  . Heart murmur   . History of blood transfusion 03/01/2015; 04/23/2017   "related to anemia,"  . Hyperlipidemia   . Hypertension   . Hypothyroidism   . Pneumonia    "just once" (03/01/2015)  . Pneumonia 04/23/2017  . Presence of combination internal cardiac defibrillator (ICD) and pacemaker    2005 in [facility]  . Pulmonary embolism ([place])   . Renal disorder     Patient Active Problem List   Diagnosis Date Noted  . Chest pain 10/28/2017  . Aspiration of liquid 10/28/2017  . Bilateral lower extremity edema   . Pneumonia 04/22/2017  . HCAP (healthcare-associated pneumonia) 04/22/2017  . Altered mental status   . Hypotension 02/16/2017  . Syncope 02/02/2016  . Faintness   . Essential hypertension 11/16/2015  . Hypothyroidism 11/16/2015  . Acute on chronic  systolic CHF (congestive heart failure) ([place]) 11/15/2015  . Hypertensive heart disease 10/04/2015  . Elevated troponin 10/04/2015  . Acute respiratory failure ([place])   . Acute systolic congestive heart failure ([place])   . NICM (nonischemic cardiomyopathy) ([place]) 08/11/2015  . Normal coronary arteries 08/11/2015  . Dyslipidemia 07/27/2015  . CKD (chronic kidney disease) stage 3, GFR 30-59 ml/min (HCC) 07/27/2015  . Anemia 07/27/2015  . Benign essential HTN 07/27/2015  . Hypothyroidism, adult 07/27/2015  . FUO (fever of unknown origin) 07/27/2015  . IDA (iron deficiency anemia) 07/27/2015  . Chronic systolic CHF (congestive heart failure) ([place]) 07/27/2015  . Colon cancer ([place]) 07/27/2015  . Sepsis ([place]) 07/24/2015  . Acute encephalopathy 07/24/2015  . Hypokalemia 07/24/2015  . Dementia 07/23/2015  . COPD (chronic obstructive pulmonary disease) ([place]) 04/25/2015  . ICD in place 04/14/2015  . Colostomy in place [facility]) 03/01/2015    Past Surgical History:  Procedure Laterality Date  . ABDOMINAL HYSTERECTOMY    . ANKLE FRACTURE SURGERY Left   . CARDIAC CATHETERIZATION N/A 03/22/2015   Procedure: Right/Left Heart Cath and Coronary Angiography;  Surgeon: Leonie Man, MD;  Location: [facility] CV LAB;  Service: Cardiovascular;  Laterality: N/A;  . CATARACT EXTRACTION W/ INTRAOCULAR LENS  IMPLANT, BILATERAL Bilateral   . CHOLECYSTECTOMY    . COLON SURGERY     cancer  . DILATION AND CURETTAGE  OF UTERUS    . FRACTURE SURGERY    . TUBAL LIGATION       OB History   None      Home Medications    Prior to Admission medications   Medication Sig Start Date End Date Taking? Authorizing Provider  acetaminophen (TYLENOL) 500 MG tablet Take 1,000 mg by mouth 3 (three) times daily.     [provider]  albuterol (PROVENTIL) (2.5 MG/3ML) 0.083% nebulizer solution Take 3 mLs (2.5 mg total) by nebulization every 4 (four) hours as needed for wheezing. 04/28/15   Geradine Girt, DO    aspirin EC 81 MG tablet Take 1 tablet (81 mg total) by mouth every morning. 03/03/15   Rai, Ripudeep K, MD  atorvastatin (LIPITOR) 20 MG tablet Take 20 mg by mouth daily.     [provider]  benzonatate (TESSALON) 100 MG capsule Take 1 capsule (100 mg total) by mouth 3 (three) times daily as needed for cough. Patient not taking: Reported on 11/28/2017 01/15/17   Deyana Wnuk, Barbette Hair, MD  carboxymethylcellulose (REFRESH) 1 % ophthalmic solution Place 1 drop into both eyes 3 (three) times daily.    [provider]  carvedilol (COREG) 3.125 MG tablet Take 2 tablets (6.25 mg total) by mouth 2 (two) times daily with a meal. 11/02/17   Mariel Aloe, MD  diclofenac sodium (VOLTAREN) 1 % GEL Apply 2 g topically 4 (four) times daily. Patient not taking: Reported on 11/28/2017 11/02/17   Mariel Aloe, MD  Fluticasone-Salmeterol (ADVAIR) 500-50 MCG/DOSE AEPB Inhale 1 puff into the lungs 2 (two) times daily. Rinse mouth after use    [provider]  furosemide (LASIX) 40 MG tablet Take 1 tablet (40 mg total) by mouth daily. 12/30/15   Shirley Friar, PA-C  hydrALAZINE (APRESOLINE) 25 MG tablet Take 0.5 tablets (12.5 mg total) by mouth 3 (three) times daily. 12/23/15   Clegg, Amy D, NP  iron polysaccharides (FERREX 150) 150 MG capsule Take 150 mg by mouth 2 (two) times daily.    [provider]  isosorbide mononitrate (IMDUR) 30 MG 24 hr tablet Take 1 tablet (30 mg total) by mouth daily. 03/24/15   Dhungel, Nishant, MD  levothyroxine (SYNTHROID, LEVOTHROID) 50 MCG tablet Take 50 mcg by mouth daily.  01/16/15   [provider]  lisinopril (PRINIVIL,ZESTRIL) 10 MG tablet Take 1 tablet (10 mg total) by mouth daily. Patient not taking: Reported on 11/28/2017 04/29/17   Murlean Iba, MD  loratadine (CLARITIN) 10 MG tablet Take 10 mg by mouth daily.    [provider]  oxymetazoline (AFRIN) 0.05 % nasal spray Place 1 spray into both nostrils 2 (two) times  daily as needed (for nose bleeds). Reported on 12/23/2015    [provider]  potassium chloride (K-DUR) 10 MEQ tablet Take 1 tablet (10 mEq total) by mouth daily. 10/28/15   Noemi Chapel, MD  ranitidine (ZANTAC) 150 MG tablet Take 150 mg by mouth daily.  01/14/15   [provider]    Family History Family History  Problem Relation Age of Onset  . CAD Unknown   . Hypertension Unknown   . Stroke Unknown   . Colon cancer Neg Hx   . GI Bleed Neg Hx     Social History Social History   Tobacco Use  . Smoking status: Never Smoker  . Smokeless tobacco: Never Used  Substance Use Topics  . Alcohol use: No  . Drug use: No  Allergies   Patient has no known allergies.   Review of Systems Review of Systems  Constitutional: Negative for fever.  Respiratory: Positive for shortness of breath. Negative for cough.   Cardiovascular: Negative for chest pain.  Gastrointestinal: Negative for abdominal pain, nausea and vomiting.  Genitourinary: Negative for dysuria.  All other systems reviewed and are negative.    Physical Exam Updated Vital Signs BP (!) 146/60   Pulse 71   Temp 98.7 F (37.1 C) (Oral)   Resp 18   Ht 5\' 6"  (1.676 m)   Wt 95.3 kg (210 lb)   SpO2 98%   BMI 33.89 kg/m   Physical Exam  Constitutional: She is oriented to person, place, and time. She appears well-developed and well-nourished.  Elderly, nontoxic-appearing, no acute distress  HENT:  Head: Normocephalic and atraumatic.  Cardiovascular: Normal rate, regular rhythm and normal heart sounds.  No murmur heard. Pulmonary/Chest: Effort normal and breath sounds normal. No respiratory distress. She has no wheezes.  Abdominal: Soft. Bowel sounds are normal.  Colostomy left lower quadrant, abdomen soft and nontender  Musculoskeletal: She exhibits edema.  1+ bilateral lower extremity edema  Neurological: She is alert and oriented to person, place, and time.  Skin: Skin is warm and dry.    Psychiatric: She has a normal mood and affect.  Nursing note and vitals reviewed.    ED Treatments / Results  Labs (all labs ordered are listed, but only abnormal results are displayed) Labs Reviewed  CBC WITH DIFFERENTIAL/PLATELET - Abnormal; Notable for the following components:      Result Value   RBC 3.26 (*)    Hemoglobin 9.2 (*)    HCT 30.1 (*)    All other components within normal limits  BASIC METABOLIC PANEL - Abnormal; Notable for the following components:   BUN 30 (*)    Creatinine, Ser 1.40 (*)    GFR calc non Af Amer 34 (*)    GFR calc Af Amer 40 (*)    All other components within normal limits  BRAIN NATRIURETIC PEPTIDE - Abnormal; Notable for the following components:   B Natriuretic Peptide 215.0 (*)    All other components within normal limits  I-STAT TROPONIN, ED    EKG EKG Interpretation  Date/Time:  Wednesday January 23 2018 02:41:27 EDT Ventricular Rate:  79 PR Interval:    QRS Duration: 177 QT Interval:  483 QTC Calculation: 554 R Axis:   95 Text Interpretation:  Sinus rhythm Prolonged PR interval Nonspecific intraventricular conduction delay No significant change since last tracing Confirmed by Thayer Jew [phone]) on 01/23/2018 3:59:50 AM   Radiology Dg Chest 2 View  Result Date: 01/23/2018 CLINICAL DATA:  Swelling and short of breath EXAM: CHEST - 2 VIEW COMPARISON:  12/01/2017, 10/28/2017, 04/28/2017, 02/28/2016 FINDINGS: Left-sided pacing device similar compared to prior. No pleural effusion. Mild cardiomegaly. Elevation of the right diaphragm. Aortic atherosclerosis. IMPRESSION: No acute interval changes compared to prior. Elevation of the right diaphragm. Cardiomegaly. Electronically Signed   By: Donavan Foil M.D.   On: 01/23/2018 03:51    Procedures Procedures (including critical care time)  Medications Ordered in ED Medications  furosemide (LASIX) injection 40 mg (40 mg Intravenous Given 01/23/18 0529)     Initial Impression /  Assessment and Plan / ED Course  I have reviewed the triage vital signs and the nursing notes.  Pertinent labs & imaging results that were available during my care of the patient were reviewed by me and considered in  my medical decision making (see chart for details).     Patient presents with concerns for lower extremity swelling.  Also reports some shortness of breath.  She is overall nontoxic appearing on exam.  She does have 1+ lower extremity edema.  Pulmonary exam is reassuring.  Vital signs are reassuring including a temperature of 98.7 and pulse ox of 98.  She is in no acute distress.  Lab work obtained.  EKG shows no evidence of acute ischemia.  No leukocytosis to suggest infection.  Chest x-ray shows no evidence of pneumothorax or pneumonia.  No evidence of volume overload.  BNP appears to be at her baseline.  Patient was given 1 dose of IV Lasix 40 mg.  Do not feel that she needs any significant increase in outpatient Lasix.  Would recommend follow-up with her primary physician for any adjustments of medications.  After history, exam, and medical workup I feel the patient has been appropriately medically screened and is safe for discharge home. Pertinent diagnoses were discussed with the patient. Patient was given return precautions.   Final Clinical Impressions(s) / ED Diagnoses   Final diagnoses:  Lower extremity edema    ED Discharge Orders    None       [name], MD 01/23/18 [phone]

## 2018-01-23 NOTE — ED Notes (Signed)
Paper work and report given to [employer].

## 2018-01-23 NOTE — ED Notes (Signed)
ED Provider at bedside. 

## 2018-01-23 NOTE — ED Notes (Signed)
Patient transported to X-ray 

## 2018-01-23 NOTE — ED Notes (Signed)
PTAR will be called for transportation home for this pt

## 2018-01-23 NOTE — ED Triage Notes (Signed)
Per EMS, pt coming from home with complaints of excess swelling in her legs, abdomen and face. Pt states that she has CHF and takes her lasix daily. Pt stated the swelling started this week. Pt stated she is SOB and dizzy at this time. No fluid noted on ausculation.

## 2018-01-23 NOTE — ED Notes (Signed)
Patient verbalizes understanding of discharge instructions. Opportunity for questioning and answers were provided. Armband removed by staff, pt discharged from ED.  

## 2018-03-15 ENCOUNTER — Emergency Department (HOSPITAL_COMMUNITY)
Admission: EM | Admit: 2018-03-15 | Discharge: 2018-03-15 | Disposition: A | Discharge status: Home / Self Care | Payer: Medicare HMO | Attending: Emergency Medicine | Admitting: Emergency Medicine

## 2018-03-15 ENCOUNTER — Encounter (HOSPITAL_COMMUNITY): Payer: Self-pay | Admitting: Emergency Medicine

## 2018-03-15 ENCOUNTER — Emergency Department (HOSPITAL_COMMUNITY): Payer: Medicare HMO

## 2018-03-15 DIAGNOSIS — I5023 Acute on chronic systolic (congestive) heart failure: Secondary | ICD-10-CM | POA: Diagnosis not present

## 2018-03-15 DIAGNOSIS — J449 Chronic obstructive pulmonary disease, unspecified: Secondary | ICD-10-CM | POA: Diagnosis not present

## 2018-03-15 DIAGNOSIS — Z79899 Other long term (current) drug therapy: Secondary | ICD-10-CM | POA: Diagnosis not present

## 2018-03-15 DIAGNOSIS — R0602 Shortness of breath: Secondary | ICD-10-CM | POA: Diagnosis present

## 2018-03-15 DIAGNOSIS — E039 Hypothyroidism, unspecified: Secondary | ICD-10-CM | POA: Insufficient documentation

## 2018-03-15 DIAGNOSIS — I130 Hypertensive heart and chronic kidney disease with heart failure and stage 1 through stage 4 chronic kidney disease, or unspecified chronic kidney disease: Secondary | ICD-10-CM | POA: Insufficient documentation

## 2018-03-15 DIAGNOSIS — N183 Chronic kidney disease, stage 3 (moderate): Secondary | ICD-10-CM | POA: Diagnosis not present

## 2018-03-15 DIAGNOSIS — I509 Heart failure, unspecified: Secondary | ICD-10-CM

## 2018-03-15 LAB — COMPREHENSIVE METABOLIC PANEL
ALK PHOS: 78 U/L (ref 38–126)
ALT: 18 U/L (ref 14–54)
AST: 21 U/L (ref 15–41)
Albumin: 3.7 g/dL (ref 3.5–5.0)
Anion gap: 10 (ref 5–15)
BILIRUBIN TOTAL: 0.4 mg/dL (ref 0.3–1.2)
BUN: 25 mg/dL — AB (ref 6–20)
CALCIUM: 9.6 mg/dL (ref 8.9–10.3)
CO2: 28 mmol/L (ref 22–32)
CREATININE: 1.26 mg/dL — AB (ref 0.44–1.00)
Chloride: 104 mmol/L (ref 101–111)
GFR calc Af Amer: 45 mL/min — ABNORMAL LOW (ref >60)
GFR calc non Af Amer: 39 mL/min — ABNORMAL LOW (ref >60)
GLUCOSE: 81 mg/dL (ref 65–99)
Potassium: 4.2 mmol/L (ref 3.5–5.1)
Sodium: 142 mmol/L (ref 135–145)
TOTAL PROTEIN: 7.4 g/dL (ref 6.5–8.1)

## 2018-03-15 LAB — CBC WITH DIFFERENTIAL/PLATELET
Abs Immature Granulocytes: 0 10*3/uL (ref 0.0–0.1)
Basophils Absolute: 0 10*3/uL (ref 0.0–0.1)
Basophils Relative: 1 %
Eosinophils Absolute: 0.1 10*3/uL (ref 0.0–0.7)
Eosinophils Relative: 2 %
HEMATOCRIT: 31 % — AB (ref 36.0–46.0)
HEMOGLOBIN: 9.2 g/dL — AB (ref 12.0–15.0)
IMMATURE GRANULOCYTES: 1 %
LYMPHS ABS: 1.7 10*3/uL (ref 0.7–4.0)
LYMPHS PCT: 27 %
MCH: 27.5 pg (ref 26.0–34.0)
MCHC: 29.7 g/dL — ABNORMAL LOW (ref 30.0–36.0)
MCV: 92.8 fL (ref 78.0–100.0)
MONOS PCT: 11 %
Monocytes Absolute: 0.7 10*3/uL (ref 0.1–1.0)
Neutro Abs: 3.9 10*3/uL (ref 1.7–7.7)
Neutrophils Relative %: 58 %
Platelets: 240 10*3/uL (ref 150–400)
RBC: 3.34 MIL/uL — ABNORMAL LOW (ref 3.87–5.11)
RDW: 14.1 % (ref 11.5–15.5)
WBC: 6.5 10*3/uL (ref 4.0–10.5)

## 2018-03-15 LAB — BRAIN NATRIURETIC PEPTIDE: B Natriuretic Peptide: 221.7 pg/mL — ABNORMAL HIGH (ref 0.0–100.0)

## 2018-03-15 LAB — TROPONIN I: Troponin I: <0.03 ng/mL (ref <0.03)

## 2018-03-15 MED ORDER — FUROSEMIDE 10 MG/ML IJ SOLN
60.0000 mg | Freq: Once | INTRAMUSCULAR | Status: AC
  Administered 2018-03-15: 60 mg via INTRAVENOUS
  Filled 2018-03-15: qty 6

## 2018-03-15 NOTE — Progress Notes (Signed)
   03/15/18 1100  Clinical Encounter Type  Visited With Patient;Health care provider  Visit Type ED;Initial  \ Rounding in the ED.  Patient was sitting up in the bed and welcomed the visit.  Stated she is feeling some better.  She has 3 children 2 local and 1 in [place].  Stated her oldest son does know she is here.  Will follow and support as needed. Chaplain Katherene Ponto

## 2018-03-15 NOTE — ED Notes (Signed)
Pt still waiting on ptar to take to nursing hiome

## 2018-03-15 NOTE — ED Notes (Signed)
Report given to Bernese at [employer].

## 2018-03-15 NOTE — ED Provider Notes (Signed)
[facility] EMERGENCY DEPARTMENT Provider Note   CSN: [id number] Arrival date & time: 03/15/18  1052     History   Chief Complaint Chief Complaint  Patient presents with  . Congestive Heart Failure  . Chest Pain    HPI [name] is a 84 y.o. female.  HPI 84 year old female with a history of congestive heart failure presents the emergency department with shortness of breath and some chest tightness today.  It is been constant since earlier this morning.  She feels like her breathing is much improved at this time.  EMS initially transfer the patient with BiPAP but on arrival to emergency department she is breathing normally off of BiPAP and her O2 sats 100% on room air.  She states no significant shortness of breath at this time.  Denies chest discomfort at this time.  No fevers or chills.  No productive cough.  No unilateral leg swelling.  She reports compliance with her medications including her Lasix.   Past Medical History:  Diagnosis Date  . Anginal pain ([place])   . Arthritis    "comes and goes" (03/01/2015)  . Asthma   . CHF (congestive heart failure) ([place])   . Colon cancer ([place])   . Colostomy care ([place])   . COPD (chronic obstructive pulmonary disease) ([place])   . Diabetes mellitus without complication ([place])    pt denies this hx on 03/01/2015  . Heart murmur   . History of blood transfusion 03/01/2015; 04/23/2017   "related to anemia,"  . Hyperlipidemia   . Hypertension   . Hypothyroidism   . Pneumonia    "just once" (03/01/2015)  . Pneumonia 04/23/2017  . Presence of combination internal cardiac defibrillator (ICD) and pacemaker    2005 in [facility]  . Pulmonary embolism ([place])   . Renal disorder     Patient Active Problem List   Diagnosis Date Noted  . Chest pain 10/28/2017  . Aspiration of liquid 10/28/2017  . Bilateral lower extremity edema   . Pneumonia 04/22/2017  . HCAP (healthcare-associated pneumonia) 04/22/2017  . Altered mental status     . Hypotension 02/16/2017  . Syncope 02/02/2016  . Faintness   . Essential hypertension 11/16/2015  . Hypothyroidism 11/16/2015  . Acute on chronic systolic CHF (congestive heart failure) ([place]) 11/15/2015  . Hypertensive heart disease 10/04/2015  . Elevated troponin 10/04/2015  . Acute respiratory failure ([place])   . Acute systolic congestive heart failure ([place])   . NICM (nonischemic cardiomyopathy) ([place]) 08/11/2015  . Normal coronary arteries 08/11/2015  . Dyslipidemia 07/27/2015  . CKD (chronic kidney disease) stage 3, GFR 30-59 ml/min (HCC) 07/27/2015  . Anemia 07/27/2015  . Benign essential HTN 07/27/2015  . Hypothyroidism, adult 07/27/2015  . FUO (fever of unknown origin) 07/27/2015  . IDA (iron deficiency anemia) 07/27/2015  . Chronic systolic CHF (congestive heart failure) ([place]) 07/27/2015  . Colon cancer ([place]) 07/27/2015  . Sepsis ([place]) 07/24/2015  . Acute encephalopathy 07/24/2015  . Hypokalemia 07/24/2015  . Dementia 07/23/2015  . COPD (chronic obstructive pulmonary disease) ([place]) 04/25/2015  . ICD in place 04/14/2015  . Colostomy in place [facility]) 03/01/2015    Past Surgical History:  Procedure Laterality Date  . ABDOMINAL HYSTERECTOMY    . ANKLE FRACTURE SURGERY Left   . CARDIAC CATHETERIZATION N/A 03/22/2015   Procedure: Right/Left Heart Cath and Coronary Angiography;  Surgeon: Leonie Man, MD;  Location: [facility] CV LAB;  Service: Cardiovascular;  Laterality: N/A;  . CATARACT EXTRACTION  W/ INTRAOCULAR LENS  IMPLANT, BILATERAL Bilateral   . CHOLECYSTECTOMY    . COLON SURGERY     cancer  . DILATION AND CURETTAGE OF UTERUS    . FRACTURE SURGERY    . TUBAL LIGATION       OB History   None      Home Medications    Prior to Admission medications   Medication Sig Start Date End Date Taking? Authorizing Provider  acetaminophen (TYLENOL) 500 MG tablet Take 1,000 mg by mouth 3 (three) times daily.    Yes [provider]  albuterol (PROVENTIL)  (2.5 MG/3ML) 0.083% nebulizer solution Take 3 mLs (2.5 mg total) by nebulization every 4 (four) hours as needed for wheezing. 04/28/15  Yes Geradine Girt, DO  aspirin EC 81 MG tablet Take 1 tablet (81 mg total) by mouth every morning. 03/03/15  Yes Rai, Ripudeep K, MD  atorvastatin (LIPITOR) 20 MG tablet Take 20 mg by mouth daily.    Yes [provider]  carboxymethylcellulose (REFRESH) 1 % ophthalmic solution Place 1 drop into both eyes 3 (three) times daily.   Yes [provider]  carvedilol (COREG) 3.125 MG tablet Take 2 tablets (6.25 mg total) by mouth 2 (two) times daily with a meal. 11/02/17  Yes Mariel Aloe, MD  cyclobenzaprine (FLEXERIL) 5 MG tablet Take 5 mg by mouth 2 (two) times daily as needed for muscle spasms.   Yes [provider]  Fluticasone-Salmeterol (ADVAIR) 500-50 MCG/DOSE AEPB Inhale 1 puff into the lungs 2 (two) times daily. Rinse mouth after use   Yes [provider]  furosemide (LASIX) 20 MG tablet Take 20 mg by mouth See admin instructions. Take one tablet by mouth three times weekly Mon, Wed, Fri at noon   Yes [provider]  furosemide (LASIX) 40 MG tablet Take 1 tablet (40 mg total) by mouth daily. Patient taking differently: Take 40 mg by mouth 2 (two) times daily.  12/30/15  Yes Shirley Friar, PA-C  hydrALAZINE (APRESOLINE) 25 MG tablet Take 0.5 tablets (12.5 mg total) by mouth 3 (three) times daily. Patient taking differently: Take 25 mg by mouth 3 (three) times daily.  12/23/15  Yes Clegg, Amy D, NP  iron polysaccharides (FERREX 150) 150 MG capsule Take 150 mg by mouth 2 (two) times daily.   Yes [provider]  isosorbide mononitrate (IMDUR) 30 MG 24 hr tablet Take 1 tablet (30 mg total) by mouth daily. 03/24/15  Yes Dhungel, Nishant, MD  levothyroxine (SYNTHROID, LEVOTHROID) 50 MCG tablet Take 50 mcg by mouth daily.  01/16/15  Yes [provider]  loratadine (CLARITIN) 10 MG tablet Take 10 mg by  mouth daily.   Yes [provider]  montelukast (SINGULAIR) 10 MG tablet Take 10 mg by mouth at bedtime.   Yes [provider]  oxymetazoline (AFRIN) 0.05 % nasal spray Place 1 spray into both nostrils 2 (two) times daily as needed (for nose bleeds). Reported on 12/23/2015   Yes [provider]  potassium chloride SA (K-DUR,KLOR-CON) 20 MEQ tablet Take 20 mEq by mouth daily.   Yes [provider]    Family History Family History  Problem Relation Age of Onset  . CAD Unknown   . Hypertension Unknown   . Stroke Unknown   . Colon cancer Neg Hx   . GI Bleed Neg Hx     Social History Social History   Tobacco Use  . Smoking status: Never Smoker  . Smokeless  tobacco: Never Used  Substance Use Topics  . Alcohol use: No  . Drug use: No     Allergies   Patient has no known allergies.   Review of Systems Review of Systems  All other systems reviewed and are negative.    Physical Exam Updated Vital Signs BP (!) 160/59   Pulse 76   Temp (!) 97 F (36.1 C) (Tympanic)   Resp 19   SpO2 99%   Physical Exam  Constitutional: She is oriented to person, place, and time. She appears well-developed and well-nourished. No distress.  HENT:  Head: Normocephalic and atraumatic.  Eyes: EOM are normal.  Neck: Normal range of motion.  Cardiovascular: Normal rate, regular rhythm and normal heart sounds.  Pulmonary/Chest: Effort normal and breath sounds normal.  Abdominal: Soft. She exhibits no distension. There is no tenderness.  Musculoskeletal: Normal range of motion.  Trace edema bilaterally  Neurological: She is alert and oriented to person, place, and time.  Skin: Skin is warm and dry.  Psychiatric: She has a normal mood and affect. Judgment normal.  Nursing note and vitals reviewed.    ED Treatments / Results  Labs (all labs ordered are listed, but only abnormal results are displayed) Labs Reviewed  CBC WITH DIFFERENTIAL/PLATELET -  Abnormal; Notable for the following components:      Result Value   RBC 3.34 (*)    Hemoglobin 9.2 (*)    HCT 31.0 (*)    MCHC 29.7 (*)    All other components within normal limits  COMPREHENSIVE METABOLIC PANEL - Abnormal; Notable for the following components:   BUN 25 (*)    Creatinine, Ser 1.26 (*)    GFR calc non Af Amer 39 (*)    GFR calc Af Amer 45 (*)    All other components within normal limits  BRAIN NATRIURETIC PEPTIDE - Abnormal; Notable for the following components:   B Natriuretic Peptide 221.7 (*)    All other components within normal limits  TROPONIN I    EKG EKG Interpretation  Date/Time:  Friday March 15 2018 10:55:49 EDT Ventricular Rate:  90 PR Interval:    QRS Duration: 167 QT Interval:  427 QTC Calculation: 523 R Axis:   102 Text Interpretation:  Sinus rhythm Nonspecific intraventricular conduction delay Lateral infarct, recent No significant change was found Confirmed by Jola Schmidt [phone]) on 03/15/2018 4:27:43 PM   Radiology Dg Chest Portable 1 View  Result Date: 03/15/2018 CLINICAL DATA:  Shortness of breath and productive cough for over 1 month. EXAM: PORTABLE CHEST 1 VIEW COMPARISON:  PA and lateral chest 01/23/2018 and 04/28/2017. CT chest 04/22/2017. FINDINGS: There is cardiomegaly without edema. Elevation of the right hemidiaphragm relative to the left is unchanged. Lungs are clear. Aortic atherosclerosis is seen. No pneumothorax or pleural effusion. Pacing device in place. IMPRESSION: No acute disease. Atherosclerosis. Cardiomegaly. Electronically Signed   By: Inge Rise M.D.   On: 03/15/2018 11:15    Procedures Procedures (including critical care time)  Medications Ordered in ED Medications  furosemide (LASIX) injection 60 mg (60 mg Intravenous Given 03/15/18 1556)     Initial Impression / Assessment and Plan / ED Course  I have reviewed the triage vital signs and the nursing notes.  Pertinent labs & imaging results that were  available during my care of the patient were reviewed by me and considered in my medical decision making (see chart for details).     Chest discomfort resolved in the emergency department.  Chest discomfort present for greater than 4 hours.  No significant EKG changes.  Troponin is negative.  Patient asymptomatic in the emergency department.  Her vital signs are stable.  She feels much better at this time.  No significant edema noted on her chest x-ray.  Baseline BNP for the patient.  Baseline hemoglobin.  Overall well-appearing.  I do not think she needs additional work-up at this time.  She is asymptomatic.  She has been ambulating in the room without difficulty.  She would like to go home.  She understands to return to the ER for new or worsening symptoms  Final Clinical Impressions(s) / ED Diagnoses   Final diagnoses:  Acute on chronic congestive heart failure, unspecified heart failure type [facility])    ED Discharge Orders    None       Jola Schmidt, MD 03/15/18 [id number]

## 2018-03-15 NOTE — Discharge Instructions (Signed)
Increase your lasix to 60 mg twice a day for 3 days.   Please return to the emergency department for any new or worsening symptoms

## 2018-03-15 NOTE — ED Triage Notes (Signed)
Per EMS- Pt from [facility] for c.o. Chest pressure to center of chest since last night, was 5/10 upon arrival, given 4 nitro and placed on CPAP for new diagnosis of CHF, has been on lasix for 1 week. Presents with pitting edema. Pt EKG is irregular with Hear block noted, pt states she has a pacemaker.

## 2018-03-22 ENCOUNTER — Encounter (HOSPITAL_COMMUNITY): Payer: Self-pay | Admitting: Emergency Medicine

## 2018-03-22 ENCOUNTER — Emergency Department (HOSPITAL_COMMUNITY)
Admission: EM | Admit: 2018-03-22 | Discharge: 2018-03-24 | Disposition: A | Discharge status: Home / Self Care | Payer: Medicare HMO | Attending: Emergency Medicine | Admitting: Emergency Medicine

## 2018-03-22 DIAGNOSIS — Z7982 Long term (current) use of aspirin: Secondary | ICD-10-CM | POA: Diagnosis not present

## 2018-03-22 DIAGNOSIS — I5023 Acute on chronic systolic (congestive) heart failure: Secondary | ICD-10-CM | POA: Insufficient documentation

## 2018-03-22 DIAGNOSIS — Z79899 Other long term (current) drug therapy: Secondary | ICD-10-CM | POA: Insufficient documentation

## 2018-03-22 DIAGNOSIS — N183 Chronic kidney disease, stage 3 (moderate): Secondary | ICD-10-CM | POA: Insufficient documentation

## 2018-03-22 DIAGNOSIS — H81399 Other peripheral vertigo, unspecified ear: Secondary | ICD-10-CM | POA: Diagnosis not present

## 2018-03-22 DIAGNOSIS — E039 Hypothyroidism, unspecified: Secondary | ICD-10-CM | POA: Insufficient documentation

## 2018-03-22 DIAGNOSIS — I130 Hypertensive heart and chronic kidney disease with heart failure and stage 1 through stage 4 chronic kidney disease, or unspecified chronic kidney disease: Secondary | ICD-10-CM | POA: Diagnosis not present

## 2018-03-22 DIAGNOSIS — N289 Disorder of kidney and ureter, unspecified: Secondary | ICD-10-CM

## 2018-03-22 DIAGNOSIS — F0390 Unspecified dementia without behavioral disturbance: Secondary | ICD-10-CM | POA: Diagnosis not present

## 2018-03-22 DIAGNOSIS — J449 Chronic obstructive pulmonary disease, unspecified: Secondary | ICD-10-CM | POA: Insufficient documentation

## 2018-03-22 DIAGNOSIS — R112 Nausea with vomiting, unspecified: Secondary | ICD-10-CM | POA: Diagnosis present

## 2018-03-22 LAB — CBC WITH DIFFERENTIAL/PLATELET
ABS IMMATURE GRANULOCYTES: 0 10*3/uL (ref 0.0–0.1)
BASOS PCT: 1 %
Basophils Absolute: 0 10*3/uL (ref 0.0–0.1)
Eosinophils Absolute: 0.2 10*3/uL (ref 0.0–0.7)
Eosinophils Relative: 2 %
HCT: 30.3 % — ABNORMAL LOW (ref 36.0–46.0)
Hemoglobin: 9.2 g/dL — ABNORMAL LOW (ref 12.0–15.0)
Immature Granulocytes: 1 %
Lymphocytes Relative: 25 %
Lymphs Abs: 1.8 10*3/uL (ref 0.7–4.0)
MCH: 27.7 pg (ref 26.0–34.0)
MCHC: 30.4 g/dL (ref 30.0–36.0)
MCV: 91.3 fL (ref 78.0–100.0)
MONO ABS: 0.6 10*3/uL (ref 0.1–1.0)
MONOS PCT: 8 %
NEUTROS ABS: 4.6 10*3/uL (ref 1.7–7.7)
Neutrophils Relative %: 63 %
PLATELETS: 259 10*3/uL (ref 150–400)
RBC: 3.32 MIL/uL — ABNORMAL LOW (ref 3.87–5.11)
RDW: 14.3 % (ref 11.5–15.5)
WBC: 7.2 10*3/uL (ref 4.0–10.5)

## 2018-03-22 LAB — BASIC METABOLIC PANEL
Anion gap: 8 (ref 5–15)
BUN: 31 mg/dL — ABNORMAL HIGH (ref 6–20)
CALCIUM: 9.2 mg/dL (ref 8.9–10.3)
CO2: 28 mmol/L (ref 22–32)
Chloride: 103 mmol/L (ref 101–111)
Creatinine, Ser: 1.46 mg/dL — ABNORMAL HIGH (ref 0.44–1.00)
GFR calc Af Amer: 38 mL/min — ABNORMAL LOW (ref >60)
GFR calc non Af Amer: 33 mL/min — ABNORMAL LOW (ref >60)
GLUCOSE: 109 mg/dL — AB (ref 65–99)
Potassium: 5.8 mmol/L — ABNORMAL HIGH (ref 3.5–5.1)
Sodium: 139 mmol/L (ref 135–145)

## 2018-03-22 MED ORDER — MECLIZINE HCL 25 MG PO TABS: 25.0000 mg | ORAL_TABLET | Freq: Three times a day (TID) | ORAL | 0 refills | Status: DC | PRN

## 2018-03-22 MED ORDER — ONDANSETRON HCL 4 MG/2ML IJ SOLN
4.0000 mg | Freq: Once | INTRAMUSCULAR | Status: AC
  Administered 2018-03-22: 4 mg via INTRAVENOUS
  Filled 2018-03-22: qty 2

## 2018-03-22 MED ORDER — ONDANSETRON HCL 4 MG PO TABS: 4.0000 mg | ORAL_TABLET | Freq: Four times a day (QID) | ORAL | 0 refills | Status: AC | PRN

## 2018-03-22 MED ORDER — MECLIZINE HCL 25 MG PO TABS
25.0000 mg | ORAL_TABLET | Freq: Once | ORAL | Status: AC
  Administered 2018-03-22: 25 mg via ORAL
  Filled 2018-03-22: qty 1

## 2018-03-22 NOTE — ED Notes (Signed)
Report taken from night shift RN - care assumed at this time; resting quietly on stretcher with eyes closed; easily arousable; awaiting PTAR for transport back to [facility] - pt aware of same

## 2018-03-22 NOTE — ED Notes (Signed)
Breakfast tray given. °

## 2018-03-22 NOTE — ED Provider Notes (Signed)
[facility] EMERGENCY DEPARTMENT Provider Note   CSN: [id number] Arrival date & time: 03/22/18  0300     History   Chief Complaint Chief Complaint  Patient presents with  . Nausea  . Emesis    HPI [name] is a 84 y.o. female.  The history is provided by the patient.  Emesis    She has history of hypertension, diabetes, hyperlipidemia, pulmonary embolism, COPD, heart failure, asthma and comes in with a sense of the room spinning with associated nausea and vomiting.  She has never had similar symptoms in the past.  She denies headache and denies ear pain or hearing loss.  She denies fever or chills.  She has not done anything to treat symptoms at home.  Past Medical History:  Diagnosis Date  . Anginal pain ([place])   . Arthritis    "comes and goes" (03/01/2015)  . Asthma   . CHF (congestive heart failure) ([place])   . Colon cancer ([place])   . Colostomy care ([place])   . COPD (chronic obstructive pulmonary disease) ([place])   . Diabetes mellitus without complication ([place])    pt denies this hx on 03/01/2015  . Heart murmur   . History of blood transfusion 03/01/2015; 04/23/2017   "related to anemia,"  . Hyperlipidemia   . Hypertension   . Hypothyroidism   . Pneumonia    "just once" (03/01/2015)  . Pneumonia 04/23/2017  . Presence of combination internal cardiac defibrillator (ICD) and pacemaker    2005 in [facility]T  . Pulmonary embolism ([place])   . Renal disorder     Patient Active Problem List   Diagnosis Date Noted  . Chest pain 10/28/2017  . Aspiration of liquid 10/28/2017  . Bilateral lower extremity edema   . Pneumonia 04/22/2017  . HCAP (healthcare-associated pneumonia) 04/22/2017  . Altered mental status   . Hypotension 02/16/2017  . Syncope 02/02/2016  . Faintness   . Essential hypertension 11/16/2015  . Hypothyroidism 11/16/2015  . Acute on chronic systolic CHF (congestive heart failure) ([place]) 11/15/2015  . Hypertensive heart disease 10/04/2015    . Elevated troponin 10/04/2015  . Acute respiratory failure ([place])   . Acute systolic congestive heart failure ([place])   . NICM (nonischemic cardiomyopathy) ([place]) 08/11/2015  . Normal coronary arteries 08/11/2015  . Dyslipidemia 07/27/2015  . CKD (chronic kidney disease) stage 3, GFR 30-59 ml/min (HCC) 07/27/2015  . Anemia 07/27/2015  . Benign essential HTN 07/27/2015  . Hypothyroidism, adult 07/27/2015  . FUO (fever of unknown origin) 07/27/2015  . IDA (iron deficiency anemia) 07/27/2015  . Chronic systolic CHF (congestive heart failure) ([place]) 07/27/2015  . Colon cancer ([place]) 07/27/2015  . Sepsis ([place]) 07/24/2015  . Acute encephalopathy 07/24/2015  . Hypokalemia 07/24/2015  . Dementia 07/23/2015  . COPD (chronic obstructive pulmonary disease) ([place]) 04/25/2015  . ICD in place 04/14/2015  . Colostomy in place [facility]) 03/01/2015    Past Surgical History:  Procedure Laterality Date  . ABDOMINAL HYSTERECTOMY    . ANKLE FRACTURE SURGERY Left   . CARDIAC CATHETERIZATION N/A 03/22/2015   Procedure: Right/Left Heart Cath and Coronary Angiography;  Surgeon: Leonie Man, MD;  Location: [facility];  Service: Cardiovascular;  Laterality: N/A;  . CATARACT EXTRACTION W/ INTRAOCULAR LENS  IMPLANT, BILATERAL Bilateral   . CHOLECYSTECTOMY    . COLON SURGERY     cancer  . DILATION AND CURETTAGE OF UTERUS    . FRACTURE SURGERY    . TUBAL LIGATION  OB History   None      Home Medications    Prior to Admission medications   Medication Sig Start Date End Date Taking? Authorizing Provider  acetaminophen (TYLENOL) 500 MG tablet Take 1,000 mg by mouth 3 (three) times daily.     [provider]  albuterol (PROVENTIL) (2.5 MG/3ML) 0.083% nebulizer solution Take 3 mLs (2.5 mg total) by nebulization every 4 (four) hours as needed for wheezing. 04/28/15   Geradine Girt, DO  aspirin EC 81 MG tablet Take 1 tablet (81 mg total) by mouth every morning. 03/03/15   Rai, Ripudeep  K, MD  atorvastatin (LIPITOR) 20 MG tablet Take 20 mg by mouth daily.     [provider]  carboxymethylcellulose (REFRESH) 1 % ophthalmic solution Place 1 drop into both eyes 3 (three) times daily.    [provider]  carvedilol (COREG) 3.125 MG tablet Take 2 tablets (6.25 mg total) by mouth 2 (two) times daily with a meal. 11/02/17   Mariel Aloe, MD  cyclobenzaprine (FLEXERIL) 5 MG tablet Take 5 mg by mouth 2 (two) times daily as needed for muscle spasms.    [provider]  Fluticasone-Salmeterol (ADVAIR) 500-50 MCG/DOSE AEPB Inhale 1 puff into the lungs 2 (two) times daily. Rinse mouth after use    [provider]  furosemide (LASIX) 20 MG tablet Take 20 mg by mouth See admin instructions. Take one tablet by mouth three times weekly Mon, Wed, Fri at noon    [provider]  furosemide (LASIX) 40 MG tablet Take 1 tablet (40 mg total) by mouth daily. Patient taking differently: Take 40 mg by mouth 2 (two) times daily.  12/30/15   Shirley Friar, PA-C  hydrALAZINE (APRESOLINE) 25 MG tablet Take 0.5 tablets (12.5 mg total) by mouth 3 (three) times daily. Patient taking differently: Take 25 mg by mouth 3 (three) times daily.  12/23/15   Clegg, Amy D, NP  iron polysaccharides (FERREX 150) 150 MG capsule Take 150 mg by mouth 2 (two) times daily.    [provider]  isosorbide mononitrate (IMDUR) 30 MG 24 hr tablet Take 1 tablet (30 mg total) by mouth daily. 03/24/15   Dhungel, Nishant, MD  levothyroxine (SYNTHROID, LEVOTHROID) 50 MCG tablet Take 50 mcg by mouth daily.  01/16/15   [provider]  loratadine (CLARITIN) 10 MG tablet Take 10 mg by mouth daily.    [provider]  montelukast (SINGULAIR) 10 MG tablet Take 10 mg by mouth at bedtime.    [provider]  oxymetazoline (AFRIN) 0.05 % nasal spray Place 1 spray into both nostrils 2 (two) times daily as needed (for nose bleeds). Reported on 12/23/2015     [provider]  potassium chloride SA (K-DUR,KLOR-CON) 20 MEQ tablet Take 20 mEq by mouth daily.    [provider]    Family History Family History  Problem Relation Age of Onset  . CAD Unknown   . Hypertension Unknown   . Stroke Unknown   . Colon cancer Neg Hx   . GI Bleed Neg Hx     Social History Social History   Tobacco Use  . Smoking status: Never Smoker  . Smokeless tobacco: Never Used  Substance Use Topics  . Alcohol use: No  . Drug use: No     Allergies   Patient has no known allergies.   Review of Systems Review of Systems  Gastrointestinal: Positive for vomiting.  All other systems reviewed  and are negative.    Physical Exam Updated Vital Signs BP (!) 146/63   Pulse 86   Temp 98 F (36.7 C) (Oral)   Resp 19   Ht 5\' 6"  (1.676 m)   Wt 95.3 kg (210 lb)   SpO2 98%   BMI 33.89 kg/m   Physical Exam  Nursing note and vitals reviewed.  84 year old female, resting comfortably and in no acute distress. Vital signs are significant for mildly elevated systolic blood pressure. Oxygen saturation is 100%, which is normal. Head is normocephalic and atraumatic. PERRLA.  She is unable to cooperate with EOM exam.  Oropharynx is clear. Neck is nontender and supple without adenopathy or JVD. Back is nontender and there is no CVA tenderness. Lungs are clear without rales, wheezes, or rhonchi. Chest is nontender. Heart has regular rate and rhythm without murmur. Abdomen is soft, flat, nontender without masses or hepatosplenomegaly and peristalsis is normoactive. Extremities have no cyanosis or edema, full range of motion is present. Skin is warm and dry without rash. Neurologic: Mental status is normal, cranial nerves are intact, there are no motor or sensory deficits.  No definite nystagmus, but patient has difficulty keeping her eyes open because of worsening of her vertigo.  Dizziness is reproduced by passive head movement.  ED Treatments /  Results  Labs (all labs ordered are listed, but only abnormal results are displayed) Labs Reviewed  BASIC METABOLIC PANEL - Abnormal; Notable for the following components:      Result Value   Potassium 5.8 (*)    Glucose, Bld 109 (*)    BUN 31 (*)    Creatinine, Ser 1.46 (*)    GFR calc non Af Amer 33 (*)    GFR calc Af Amer 38 (*)    All other components within normal limits  CBC WITH DIFFERENTIAL/PLATELET - Abnormal; Notable for the following components:   RBC 3.32 (*)    Hemoglobin 9.2 (*)    HCT 30.3 (*)    All other components within normal limits    EKG EKG Interpretation  Date/Time:  Friday March 22 2018 03:11:26 EDT Ventricular Rate:  82 PR Interval:    QRS Duration: 179 QT Interval:  465 QTC Calculation: 544 R Axis:   98 Text Interpretation:  Sinus rhythm Prolonged PR interval Probable left atrial enlargement Nonspecific intraventricular conduction delay Minimal ST depression When compared with ECG of 03/15/2018, No significant change was found Confirmed by Delora Fuel ([id number]) on 03/22/2018 3:13:57 AM  Procedures Procedures   Medications Ordered in ED Medications  ondansetron (ZOFRAN) injection 4 mg (has no administration in time range)  meclizine (ANTIVERT) tablet 25 mg (has no administration in time range)     Initial Impression / Assessment and Plan / ED Course  I have reviewed the triage vital signs and the nursing notes.  Pertinent labs & imaging results that were available during my care of the patient were reviewed by me and considered in my medical decision making (see chart for details).  Dizziness with nausea and vomiting and pattern strongly suggestive of peripheral vertigo.  She will be given IV ondansetron and oral meclizine.  She had excellent relief of nausea with ondansetron, moderate to good relief of vertigo with meclizine.  Labs are significant for mild renal insufficiency which is not changed from baseline.  Mild hyperkalemia secondary to  hemolysis of the specimen.  She is discharged with prescriptions for ondansetron and meclizine, follow-up with PCP.  Return precautions discussed.  Final Clinical Impressions(s) / ED Diagnoses   Final diagnoses:  Peripheral vertigo, unspecified laterality  Renal insufficiency    ED Discharge Orders        Ordered    meclizine (ANTIVERT) 25 MG tablet  3 times daily PRN     03/22/18 0500    ondansetron (ZOFRAN) 4 MG tablet  Every 6 hours PRN     02/58/52 [id number]       Delora Fuel, MD 42/35/36 [phone]

## 2018-03-22 NOTE — ED Notes (Signed)
[facility] in to transport pt back to assisted living facility

## 2018-03-22 NOTE — ED Triage Notes (Signed)
Via [facility], pt arrives from [facility] c/o of dizziness, N/V. Per EMS the facility reports episodes of emesis but has'nt had any episodes with ems. Ems also states the pt has a pacemaker but is not in a paced rhythm.

## 2018-03-22 NOTE — ED Notes (Signed)
PT ambulated to bathroom with stand-by assist.  Given snack.  Still awaiting breakfast.

## 2018-03-22 NOTE — ED Notes (Signed)
[facility] called by [facility] for transport

## 2018-03-22 NOTE — Discharge Instructions (Addendum)
Return if symptoms are getting worse. °

## 2018-05-18 ENCOUNTER — Emergency Department (HOSPITAL_COMMUNITY)
Admission: EM | Admit: 2018-05-18 | Discharge: 2018-05-19 | Disposition: A | Discharge status: Home / Self Care | Payer: Medicare HMO | Attending: Emergency Medicine | Admitting: Emergency Medicine

## 2018-05-18 DIAGNOSIS — E1122 Type 2 diabetes mellitus with diabetic chronic kidney disease: Secondary | ICD-10-CM | POA: Insufficient documentation

## 2018-05-18 DIAGNOSIS — Z933 Colostomy status: Secondary | ICD-10-CM | POA: Insufficient documentation

## 2018-05-18 DIAGNOSIS — E039 Hypothyroidism, unspecified: Secondary | ICD-10-CM | POA: Insufficient documentation

## 2018-05-18 DIAGNOSIS — Z86718 Personal history of other venous thrombosis and embolism: Secondary | ICD-10-CM | POA: Insufficient documentation

## 2018-05-18 DIAGNOSIS — M79641 Pain in right hand: Secondary | ICD-10-CM | POA: Diagnosis present

## 2018-05-18 DIAGNOSIS — I5022 Chronic systolic (congestive) heart failure: Secondary | ICD-10-CM | POA: Insufficient documentation

## 2018-05-18 DIAGNOSIS — I130 Hypertensive heart and chronic kidney disease with heart failure and stage 1 through stage 4 chronic kidney disease, or unspecified chronic kidney disease: Secondary | ICD-10-CM | POA: Diagnosis not present

## 2018-05-18 DIAGNOSIS — E785 Hyperlipidemia, unspecified: Secondary | ICD-10-CM | POA: Diagnosis not present

## 2018-05-18 DIAGNOSIS — Z85038 Personal history of other malignant neoplasm of large intestine: Secondary | ICD-10-CM | POA: Insufficient documentation

## 2018-05-18 DIAGNOSIS — M109 Gout, unspecified: Secondary | ICD-10-CM

## 2018-05-18 DIAGNOSIS — Z7982 Long term (current) use of aspirin: Secondary | ICD-10-CM | POA: Insufficient documentation

## 2018-05-18 DIAGNOSIS — Z79899 Other long term (current) drug therapy: Secondary | ICD-10-CM | POA: Diagnosis not present

## 2018-05-18 DIAGNOSIS — J449 Chronic obstructive pulmonary disease, unspecified: Secondary | ICD-10-CM | POA: Diagnosis not present

## 2018-05-18 DIAGNOSIS — N183 Chronic kidney disease, stage 3 (moderate): Secondary | ICD-10-CM | POA: Insufficient documentation

## 2018-05-18 NOTE — ED Notes (Signed)
Bed: SE39 Expected date:  Expected time:  Means of arrival:  Comments: 84 yo F/SNF Right hand swelling

## 2018-05-18 NOTE — ED Triage Notes (Signed)
Pt BIB [facility] from [facility]. Pt is c/o rt hand swelling. No trauma or situation noted. Swelling started last night. PMS in tact. Painful to touch, no signs of trauma noted. Pt tried tylenol and bengay with no relief.

## 2018-05-19 ENCOUNTER — Emergency Department (HOSPITAL_COMMUNITY): Payer: Medicare HMO

## 2018-05-19 DIAGNOSIS — M109 Gout, unspecified: Secondary | ICD-10-CM | POA: Diagnosis not present

## 2018-05-19 MED ORDER — IBUPROFEN 200 MG PO TABS
600.0000 mg | ORAL_TABLET | Freq: Once | ORAL | Status: AC
  Administered 2018-05-19: 600 mg via ORAL
  Filled 2018-05-19: qty 3

## 2018-05-19 MED ORDER — HYDROCODONE-ACETAMINOPHEN 5-325 MG PO TABS
1.0000 | ORAL_TABLET | Freq: Once | ORAL | Status: AC
  Administered 2018-05-19: 1 via ORAL
  Filled 2018-05-19: qty 1

## 2018-05-19 MED ORDER — COLCHICINE 0.6 MG PO TABS: 0.6000 mg | ORAL_TABLET | Freq: Two times a day (BID) | ORAL | 0 refills | Status: DC

## 2018-05-19 MED ORDER — HYDROCODONE-ACETAMINOPHEN 5-325 MG PO TABS: 1.0000 | ORAL_TABLET | ORAL | 0 refills | Status: DC | PRN

## 2018-05-19 NOTE — ED Provider Notes (Signed)
[facility] DEPT Provider Note   CSN: [id number] Arrival date & time: 05/18/18  2354     History   Chief Complaint Chief Complaint  Patient presents with  . Hand Problem    HPI [name] is a 84 y.o. female.  HPI 84 year old female complains of right hand pain over the past 24 hours.  She feels like this started shortly after she used her fist and knuckles to push herself up in the bed yesterday.  She has had gradually worsening of her pain since then.  The majority of the pain is in her right middle finger.  She is tried Tylenol and BenGay without improvement in her symptoms.  No history of gout.  Does have a history of arthritis.  She states the area of the right middle finger and right middle finger PIP joint is painful to palpation.  No fevers or chills.  No weakness of her arm.  No pain or warmth or redness streaking up her hand or arm.  Past Medical History:  Diagnosis Date  . Anginal pain ([place])   . Arthritis    "comes and goes" (03/01/2015)  . Asthma   . CHF (congestive heart failure) ([place])   . Colon cancer ([place])   . Colostomy care ([place])   . COPD (chronic obstructive pulmonary disease) ([place])   . Diabetes mellitus without complication ([place])    pt denies this hx on 03/01/2015  . Heart murmur   . History of blood transfusion 03/01/2015; 04/23/2017   "related to anemia,"  . Hyperlipidemia   . Hypertension   . Hypothyroidism   . Pneumonia    "just once" (03/01/2015)  . Pneumonia 04/23/2017  . Presence of combination internal cardiac defibrillator (ICD) and pacemaker    2005 in [facility] MDT  . Pulmonary embolism ([place])   . Renal disorder     Patient Active Problem List   Diagnosis Date Noted  . Chest pain 10/28/2017  . Aspiration of liquid 10/28/2017  . Bilateral lower extremity edema   . Pneumonia 04/22/2017  . HCAP (healthcare-associated pneumonia) 04/22/2017  . Altered mental status   . Hypotension 02/16/2017  . Syncope 02/02/2016    . Faintness   . Essential hypertension 11/16/2015  . Hypothyroidism 11/16/2015  . Acute on chronic systolic CHF (congestive heart failure) ([place]) 11/15/2015  . Hypertensive heart disease 10/04/2015  . Elevated troponin 10/04/2015  . Acute respiratory failure ([place])   . Acute systolic congestive heart failure ([place])   . NICM (nonischemic cardiomyopathy) ([place]) 08/11/2015  . Normal coronary arteries 08/11/2015  . Dyslipidemia 07/27/2015  . CKD (chronic kidney disease) stage 3, GFR 30-59 ml/min (HCC) 07/27/2015  . Anemia 07/27/2015  . Benign essential HTN 07/27/2015  . Hypothyroidism, adult 07/27/2015  . FUO (fever of unknown origin) 07/27/2015  . IDA (iron deficiency anemia) 07/27/2015  . Chronic systolic CHF (congestive heart failure) ([place]) 07/27/2015  . Colon cancer ([place]) 07/27/2015  . Sepsis ([place]) 07/24/2015  . Acute encephalopathy 07/24/2015  . Hypokalemia 07/24/2015  . Dementia 07/23/2015  . COPD (chronic obstructive pulmonary disease) ([place]) 04/25/2015  . ICD in place 04/14/2015  . Colostomy in place [facility]) 03/01/2015    Past Surgical History:  Procedure Laterality Date  . ABDOMINAL HYSTERECTOMY    . ANKLE FRACTURE SURGERY Left   . CARDIAC CATHETERIZATION N/A 03/22/2015   Procedure: Right/Left Heart Cath and Coronary Angiography;  Surgeon: Leonie Man, MD;  Location: [facility];  Service: Cardiovascular;  Laterality: N/A;  .  CATARACT EXTRACTION W/ INTRAOCULAR LENS  IMPLANT, BILATERAL Bilateral   . CHOLECYSTECTOMY    . COLON SURGERY     cancer  . DILATION AND CURETTAGE OF UTERUS    . FRACTURE SURGERY    . TUBAL LIGATION       OB History   None      Home Medications    Prior to Admission medications   Medication Sig Start Date End Date Taking? Authorizing Provider  acetaminophen (TYLENOL) 500 MG tablet Take 1,000 mg by mouth 3 (three) times daily.    Yes [provider]  aspirin EC 81 MG tablet Take 1 tablet (81 mg total) by mouth every morning.  03/03/15  Yes Rai, Ripudeep K, MD  atorvastatin (LIPITOR) 20 MG tablet Take 20 mg by mouth daily.    Yes [provider]  carboxymethylcellulose (REFRESH) 1 % ophthalmic solution Place 1 drop into both eyes 3 (three) times daily.   Yes [provider]  carvedilol (COREG) 3.125 MG tablet Take 2 tablets (6.25 mg total) by mouth 2 (two) times daily with a meal. 11/02/17  Yes Mariel Aloe, MD  Fluticasone-Salmeterol (ADVAIR) 500-50 MCG/DOSE AEPB Inhale 1 puff into the lungs 2 (two) times daily. Rinse mouth after use   Yes [provider]  furosemide (LASIX) 40 MG tablet Take 1 tablet (40 mg total) by mouth daily. Patient taking differently: Take 40 mg by mouth 2 (two) times daily.  12/30/15  Yes Shirley Friar, PA-C  hydrALAZINE (APRESOLINE) 25 MG tablet Take 0.5 tablets (12.5 mg total) by mouth 3 (three) times daily. Patient taking differently: Take 25 mg by mouth 3 (three) times daily.  12/23/15  Yes Clegg, Amy D, NP  iron polysaccharides (FERREX 150) 150 MG capsule Take 150 mg by mouth 2 (two) times daily.   Yes [provider]  isosorbide mononitrate (IMDUR) 30 MG 24 hr tablet Take 1 tablet (30 mg total) by mouth daily. 03/24/15  Yes Dhungel, Nishant, MD  levothyroxine (SYNTHROID, LEVOTHROID) 50 MCG tablet Take 50 mcg by mouth daily.  01/16/15  Yes [provider]  loratadine (CLARITIN) 10 MG tablet Take 10 mg by mouth daily.   Yes [provider]  montelukast (SINGULAIR) 10 MG tablet Take 10 mg by mouth at bedtime.   Yes [provider]  potassium chloride SA (K-DUR,KLOR-CON) 20 MEQ tablet Take 20 mEq by mouth 2 (two) times daily.    Yes [provider]  ranitidine (ZANTAC) 150 MG tablet Take 150 mg by mouth daily.   Yes [provider]  albuterol (PROVENTIL) (2.5 MG/3ML) 0.083% nebulizer solution Take 3 mLs (2.5 mg total) by nebulization every 4 (four) hours as needed for wheezing. 04/28/15   Geradine Girt, DO    colchicine 0.6 MG tablet Take 1 tablet (0.6 mg total) by mouth 2 (two) times daily. 05/19/18   Jola Schmidt, MD  cyclobenzaprine (FLEXERIL) 5 MG tablet Take 5 mg by mouth 2 (two) times daily as needed for muscle spasms.    [provider]  furosemide (LASIX) 20 MG tablet Take 20 mg by mouth daily as needed for fluid.     [provider]  HYDROcodone-acetaminophen (NORCO/VICODIN) 5-325 MG tablet Take 1 tablet by mouth every 4 (four) hours as needed for moderate pain. 05/19/18   Jola Schmidt, MD  meclizine (ANTIVERT) 25 MG tablet Take 1 tablet (25 mg total) by mouth 3 (three) times daily as needed for dizziness. 04/16/61   Delora Fuel, MD  ondansetron (ZOFRAN) 4 MG tablet Take 1 tablet (4 mg total) by mouth every 6 (six) hours as needed for nausea or vomiting. Patient not taking: Reported on 11/18/4707 04/06/35   Delora Fuel, MD  oxymetazoline (AFRIN) 0.05 % nasal spray Place 1 spray into both nostrils 2 (two) times daily as needed (for nose bleeds). Reported on 12/23/2015    [provider]    Family History Family History  Problem Relation Age of Onset  . CAD Unknown   . Hypertension Unknown   . Stroke Unknown   . Colon cancer Neg Hx   . GI Bleed Neg Hx     Social History Social History   Tobacco Use  . Smoking status: Never Smoker  . Smokeless tobacco: Never Used  Substance Use Topics  . Alcohol use: No  . Drug use: No     Allergies   Patient has no known allergies.   Review of Systems Review of Systems  All other systems reviewed and are negative.    Physical Exam Updated Vital Signs BP (!) 176/70 (BP Location: Right Arm)   Pulse 90   Temp 98.7 F (37.1 C) (Oral)   Resp 18   SpO2 99%   Physical Exam  Constitutional: She is oriented to person, place, and time. She appears well-developed and well-nourished.  HENT:  Head: Normocephalic.  Eyes: EOM are normal.  Neck: Normal range of motion.  Pulmonary/Chest: Effort normal.  Abdominal:  She exhibits no distension.  Musculoskeletal: Normal range of motion.  Mild warmth and tenderness over the right middle finger PIP joint.  Mild swelling of the right middle finger as compared to the other fingers.  Able to flex and extend the right middle finger albeit with some pain.  Right hand is otherwise normal.  Normal right radial pulse.  Normal perfusion all 5 fingertips of the right hand  Neurological: She is alert and oriented to person, place, and time.  Psychiatric: She has a normal mood and affect.  Nursing note and vitals reviewed.    ED Treatments / Results  Labs (all labs ordered are listed, but only abnormal results are displayed) Labs Reviewed - No data to display  EKG None  Radiology Dg Hand Complete Right  Result Date: 05/19/2018 CLINICAL DATA:  Right hand swelling more so in the middle finger since last evening. EXAM: RIGHT HAND - COMPLETE 3+ VIEW COMPARISON:  06/07/2008 FINDINGS: Normal bone mineralization without marginal nor extra articular erosions. No periostitis. Degenerative joint space narrowing of the DIP and PIP joints of the second through fifth digits, interphalangeal joint of the thumb as well as the MCP articulations are identified. Carpal rows are maintained. No acute fracture. Minimal erosive change of the distal ulna at the fovea. Nonspecific generalized soft tissue swelling of the digits without soft tissue mass or mineralization. IMPRESSION: Osteoarthritis of the hand with minimal erosive change suggested of the fovea of the ulna. Nonspecific soft tissue swelling of the hand and digits. Electronically Signed   By: Ashley Royalty M.D.   On: 05/19/2018 01:46    Procedures Procedures (including critical care time)  Medications Ordered in ED Medications  HYDROcodone-acetaminophen (NORCO/VICODIN) 5-325 MG per tablet 1 tablet (1 tablet Oral Given 05/19/18 0145)  ibuprofen (ADVIL,MOTRIN) tablet 600 mg (600 mg Oral Given 05/19/18 0145)     Initial  Impression / Assessment and Plan / ED Course  I have reviewed the triage vital signs and the nursing notes.  Pertinent labs & imaging results that were  available during my care of the patient were reviewed by me and considered in my medical decision making (see chart for details).     Suspect inflammatory arthritis such as gout of the right middle finger PIP joint.  Doubt septic arthritis.  Discharged home with colchicine and pain medication.  Recommended elevation and ice.  Close primary care follow-up.  Patient encouraged to return to the ER for new or worsening symptoms.  Doubt septic arthritis.  No signs to suggest cellulitis or flexor tenosynovitis.  Final Clinical Impressions(s) / ED Diagnoses   Final diagnoses:  Acute gout of right hand, unspecified cause    ED Discharge Orders         Ordered    HYDROcodone-acetaminophen (NORCO/VICODIN) 5-325 MG tablet  Every 4 hours PRN     05/19/18 0233    colchicine 0.6 MG tablet  2 times daily     05/19/18 0233           Jola Schmidt, MD 05/19/18 [phone]

## 2018-05-19 NOTE — ED Notes (Signed)
Attempted report call to [facility] x3

## 2018-05-19 NOTE — ED Notes (Signed)
[facility] called  

## 2018-06-23 ENCOUNTER — Emergency Department (HOSPITAL_COMMUNITY)
Admission: EM | Admit: 2018-06-23 | Discharge: 2018-06-23 | Disposition: A | Discharge status: Skilled Nursing Facility | Payer: Medicare HMO | Attending: Emergency Medicine | Admitting: Emergency Medicine

## 2018-06-23 ENCOUNTER — Emergency Department (HOSPITAL_COMMUNITY): Payer: Medicare HMO

## 2018-06-23 DIAGNOSIS — R1030 Lower abdominal pain, unspecified: Secondary | ICD-10-CM | POA: Insufficient documentation

## 2018-06-23 DIAGNOSIS — Z79899 Other long term (current) drug therapy: Secondary | ICD-10-CM | POA: Diagnosis not present

## 2018-06-23 DIAGNOSIS — Y939 Activity, unspecified: Secondary | ICD-10-CM | POA: Insufficient documentation

## 2018-06-23 DIAGNOSIS — Z85038 Personal history of other malignant neoplasm of large intestine: Secondary | ICD-10-CM | POA: Insufficient documentation

## 2018-06-23 DIAGNOSIS — Y92128 Other place in nursing home as the place of occurrence of the external cause: Secondary | ICD-10-CM | POA: Insufficient documentation

## 2018-06-23 DIAGNOSIS — E039 Hypothyroidism, unspecified: Secondary | ICD-10-CM | POA: Diagnosis not present

## 2018-06-23 DIAGNOSIS — E119 Type 2 diabetes mellitus without complications: Secondary | ICD-10-CM | POA: Insufficient documentation

## 2018-06-23 DIAGNOSIS — Z7982 Long term (current) use of aspirin: Secondary | ICD-10-CM | POA: Insufficient documentation

## 2018-06-23 DIAGNOSIS — W010XXA Fall on same level from slipping, tripping and stumbling without subsequent striking against object, initial encounter: Secondary | ICD-10-CM | POA: Diagnosis not present

## 2018-06-23 DIAGNOSIS — S93402A Sprain of unspecified ligament of left ankle, initial encounter: Secondary | ICD-10-CM | POA: Insufficient documentation

## 2018-06-23 DIAGNOSIS — I5022 Chronic systolic (congestive) heart failure: Secondary | ICD-10-CM | POA: Diagnosis not present

## 2018-06-23 DIAGNOSIS — S99912A Unspecified injury of left ankle, initial encounter: Secondary | ICD-10-CM | POA: Diagnosis present

## 2018-06-23 DIAGNOSIS — I110 Hypertensive heart disease with heart failure: Secondary | ICD-10-CM | POA: Diagnosis not present

## 2018-06-23 DIAGNOSIS — J45909 Unspecified asthma, uncomplicated: Secondary | ICD-10-CM | POA: Diagnosis not present

## 2018-06-23 DIAGNOSIS — Y999 Unspecified external cause status: Secondary | ICD-10-CM | POA: Insufficient documentation

## 2018-06-23 DIAGNOSIS — S93492A Sprain of other ligament of left ankle, initial encounter: Secondary | ICD-10-CM

## 2018-06-23 LAB — I-STAT CHEM 8, ED
BUN: 59 mg/dL — ABNORMAL HIGH (ref 8–23)
CREATININE: 1.8 mg/dL — AB (ref 0.44–1.00)
Calcium, Ion: 1.09 mmol/L — ABNORMAL LOW (ref 1.15–1.40)
Chloride: 102 mmol/L (ref 98–111)
Glucose, Bld: 103 mg/dL — ABNORMAL HIGH (ref 70–99)
HEMATOCRIT: 28 % — AB (ref 36.0–46.0)
HEMOGLOBIN: 9.5 g/dL — AB (ref 12.0–15.0)
POTASSIUM: 6.1 mmol/L — AB (ref 3.5–5.1)
SODIUM: 136 mmol/L (ref 135–145)
TCO2: 28 mmol/L (ref 22–32)

## 2018-06-23 LAB — COMPREHENSIVE METABOLIC PANEL
ALBUMIN: 3.5 g/dL (ref 3.5–5.0)
ALT: 38 U/L (ref 0–44)
ANION GAP: 11 (ref 5–15)
AST: 37 U/L (ref 15–41)
Alkaline Phosphatase: 91 U/L (ref 38–126)
BILIRUBIN TOTAL: 0.8 mg/dL (ref 0.3–1.2)
BUN: 41 mg/dL — ABNORMAL HIGH (ref 8–23)
CO2: 27 mmol/L (ref 22–32)
Calcium: 9.6 mg/dL (ref 8.9–10.3)
Chloride: 102 mmol/L (ref 98–111)
Creatinine, Ser: 1.72 mg/dL — ABNORMAL HIGH (ref 0.44–1.00)
GFR calc non Af Amer: 26 mL/min — ABNORMAL LOW (ref >60)
GFR, EST AFRICAN AMERICAN: 31 mL/min — AB (ref >60)
GLUCOSE: 101 mg/dL — AB (ref 70–99)
POTASSIUM: 5 mmol/L (ref 3.5–5.1)
Sodium: 140 mmol/L (ref 135–145)
TOTAL PROTEIN: 7.6 g/dL (ref 6.5–8.1)

## 2018-06-23 LAB — CBC WITH DIFFERENTIAL/PLATELET
BASOS ABS: 0 10*3/uL (ref 0.0–0.1)
BASOS PCT: 0 %
Eosinophils Absolute: 0.1 10*3/uL (ref 0.0–0.7)
Eosinophils Relative: 1 %
HEMATOCRIT: 29.8 % — AB (ref 36.0–46.0)
Hemoglobin: 9.2 g/dL — ABNORMAL LOW (ref 12.0–15.0)
LYMPHS PCT: 22 %
Lymphs Abs: 2.1 10*3/uL (ref 0.7–4.0)
MCH: 27.9 pg (ref 26.0–34.0)
MCHC: 30.9 g/dL (ref 30.0–36.0)
MCV: 90.3 fL (ref 78.0–100.0)
Monocytes Absolute: 0.8 10*3/uL (ref 0.1–1.0)
Monocytes Relative: 8 %
NEUTROS ABS: 6.6 10*3/uL (ref 1.7–7.7)
NEUTROS PCT: 69 %
Platelets: 268 10*3/uL (ref 150–400)
RBC: 3.3 MIL/uL — AB (ref 3.87–5.11)
RDW: 15 % (ref 11.5–15.5)
WBC: 9.6 10*3/uL (ref 4.0–10.5)

## 2018-06-23 LAB — LIPASE, BLOOD: LIPASE: 28 U/L (ref 11–51)

## 2018-06-23 MED ORDER — SODIUM CHLORIDE 0.9 % IV BOLUS
1000.0000 mL | Freq: Once | INTRAVENOUS | Status: AC
  Administered 2018-06-23: 1000 mL via INTRAVENOUS

## 2018-06-23 MED ORDER — DICLOFENAC SODIUM 1 % TD GEL: 4.0000 g | Freq: Four times a day (QID) | TRANSDERMAL | 0 refills | Status: DC

## 2018-06-23 MED ORDER — MORPHINE SULFATE (PF) 2 MG/ML IV SOLN
2.0000 mg | Freq: Once | INTRAVENOUS | Status: AC
  Administered 2018-06-23: 2 mg via INTRAVENOUS
  Filled 2018-06-23: qty 1

## 2018-06-23 MED ORDER — ONDANSETRON HCL 4 MG/2ML IJ SOLN
4.0000 mg | Freq: Once | INTRAMUSCULAR | Status: AC
  Administered 2018-06-23: 4 mg via INTRAVENOUS
  Filled 2018-06-23: qty 2

## 2018-06-23 NOTE — ED Notes (Signed)
Attempted to call [facility] for report. No answer.

## 2018-06-23 NOTE — ED Notes (Signed)
This RN unable to obtain IV. Will have another RN try.

## 2018-06-23 NOTE — ED Triage Notes (Signed)
Transported by [employer] from [facility]-- experienced a mechanical fall injuring right ankle. (-) LOC, neck/back pain, or obvious deformity. AAO x 4

## 2018-06-23 NOTE — Discharge Instructions (Signed)
Continue to take your tylenol, use the voltaren gel as needed, apply to your ankle where it hurts.  Follow up with your PCP.

## 2018-06-23 NOTE — ED Notes (Signed)
[facility] called for transportation  

## 2018-06-23 NOTE — ED Provider Notes (Signed)
[facility] DEPT Provider Note   CSN: [id number] Arrival date & time: 06/23/18  1434     History   Chief Complaint Chief Complaint  Patient presents with  . Fall    HPI [name] is a 84 y.o. female.  84 yo F with a chief complaint of left ankle pain after fall.  She thinks she lost her balance while she was walking in the hallway at her nursing home.  Has pain to the lateral aspect of the ankle.  Was able to bear weight but felt that it was more painful than it should be.  She denies any other injury in the fall.  She denied chest pain shortness of breath head injury or neck pain.  When asked about abdominal pain and the review of systems she said only a mild belly has been hurting to the lower portion across starting at the ostomy site.  Is been sharp and severe and worsening over the past week.  No fevers or vomiting.  No change in ostomy output.  The history is provided by the patient.  Fall  This is a new problem. The current episode started yesterday. The problem occurs constantly. Associated symptoms include abdominal pain. Pertinent negatives include no chest pain, no headaches and no shortness of breath. The symptoms are aggravated by bending and walking. Nothing relieves the symptoms. She has tried nothing for the symptoms. The treatment provided no relief.    Past Medical History:  Diagnosis Date  . Anginal pain ([place])   . Arthritis    "comes and goes" (03/01/2015)  . Asthma   . CHF (congestive heart failure) ([place])   . Colon cancer ([place])   . Colostomy care ([place])   . COPD (chronic obstructive pulmonary disease) ([place])   . Diabetes mellitus without complication ([place])    pt denies this hx on 03/01/2015  . Heart murmur   . History of blood transfusion 03/01/2015; 04/23/2017   "related to anemia,"  . Hyperlipidemia   . Hypertension   . Hypothyroidism   . Pneumonia    "just once" (03/01/2015)  . Pneumonia 04/23/2017  . Presence of  combination internal cardiac defibrillator (ICD) and pacemaker    2005 in [facility]  . Pulmonary embolism ([place])   . Renal disorder     Patient Active Problem List   Diagnosis Date Noted  . Chest pain 10/28/2017  . Aspiration of liquid 10/28/2017  . Bilateral lower extremity edema   . Pneumonia 04/22/2017  . HCAP (healthcare-associated pneumonia) 04/22/2017  . Altered mental status   . Hypotension 02/16/2017  . Syncope 02/02/2016  . Faintness   . Essential hypertension 11/16/2015  . Hypothyroidism 11/16/2015  . Acute on chronic systolic CHF (congestive heart failure) ([place]) 11/15/2015  . Hypertensive heart disease 10/04/2015  . Elevated troponin 10/04/2015  . Acute respiratory failure ([place])   . Acute systolic congestive heart failure ([place])   . NICM (nonischemic cardiomyopathy) ([place]) 08/11/2015  . Normal coronary arteries 08/11/2015  . Dyslipidemia 07/27/2015  . CKD (chronic kidney disease) stage 3, GFR 30-59 ml/min (HCC) 07/27/2015  . Anemia 07/27/2015  . Benign essential HTN 07/27/2015  . Hypothyroidism, adult 07/27/2015  . FUO (fever of unknown origin) 07/27/2015  . IDA (iron deficiency anemia) 07/27/2015  . Chronic systolic CHF (congestive heart failure) ([place]) 07/27/2015  . Colon cancer ([place]) 07/27/2015  . Sepsis ([place]) 07/24/2015  . Acute encephalopathy 07/24/2015  . Hypokalemia 07/24/2015  . Dementia 07/23/2015  . COPD (chronic  obstructive pulmonary disease) ([place]) 04/25/2015  . ICD in place 04/14/2015  . Colostomy in place [facility]) 03/01/2015    Past Surgical History:  Procedure Laterality Date  . ABDOMINAL HYSTERECTOMY    . ANKLE FRACTURE SURGERY Left   . CARDIAC CATHETERIZATION N/A 03/22/2015   Procedure: Right/Left Heart Cath and Coronary Angiography;  Surgeon: Leonie Man, MD;  Location: [facility];  Service: Cardiovascular;  Laterality: N/A;  . CATARACT EXTRACTION W/ INTRAOCULAR LENS  IMPLANT, BILATERAL Bilateral   . CHOLECYSTECTOMY    . COLON SURGERY      cancer  . DILATION AND CURETTAGE OF UTERUS    . FRACTURE SURGERY    . TUBAL LIGATION       OB History   None      Home Medications    Prior to Admission medications   Medication Sig Start Date End Date Taking? Authorizing Provider  acetaminophen (TYLENOL) 500 MG tablet Take 1,000 mg by mouth 3 (three) times daily.    Yes [provider]  albuterol (PROVENTIL) (2.5 MG/3ML) 0.083% nebulizer solution Take 3 mLs (2.5 mg total) by nebulization every 4 (four) hours as needed for wheezing. 04/28/15  Yes Geradine Girt, DO  aspirin EC 81 MG tablet Take 1 tablet (81 mg total) by mouth every morning. 03/03/15  Yes Rai, Ripudeep K, MD  atorvastatin (LIPITOR) 20 MG tablet Take 20 mg by mouth daily.    Yes [provider]  carboxymethylcellulose (REFRESH) 1 % ophthalmic solution Place 1 drop into both eyes 3 (three) times daily.   Yes [provider]  carvedilol (COREG) 6.25 MG tablet Take 6.25 mg by mouth 2 (two) times daily with a meal.   Yes [provider]  cyclobenzaprine (FLEXERIL) 5 MG tablet Take 5 mg by mouth 2 (two) times daily as needed for muscle spasms.   Yes [provider]  cyclobenzaprine (FLEXERIL) 5 MG tablet Take 5 mg by mouth at bedtime.   Yes [provider]  Fluticasone-Salmeterol (ADVAIR) 500-50 MCG/DOSE AEPB Inhale 1 puff into the lungs 2 (two) times daily. Rinse mouth after use   Yes [provider]  furosemide (LASIX) 20 MG tablet Take 20 mg by mouth daily as needed (Weight gain of more >3 lbs in 24 hours.).   Yes [provider]  furosemide (LASIX) 40 MG tablet Take 1 tablet (40 mg total) by mouth daily. Patient taking differently: Take 40 mg by mouth 2 (two) times daily.  12/30/15  Yes Shirley Friar, PA-C  furosemide (LASIX) 40 MG tablet Take 40 mg by mouth 2 (two) times daily.   Yes [provider]  hydrALAZINE (APRESOLINE) 25 MG tablet Take 0.5 tablets (12.5 mg total) by mouth 3  (three) times daily. Patient taking differently: Take 25 mg by mouth 3 (three) times daily.  12/23/15  Yes Clegg, Amy D, NP  HYDROcodone-acetaminophen (NORCO/VICODIN) 5-325 MG tablet Take 1 tablet by mouth every 4 (four) hours as needed for moderate pain. 05/19/18  Yes Jola Schmidt, MD  iron polysaccharides (FERREX 150) 150 MG capsule Take 150 mg by mouth 2 (two) times daily.   Yes [provider]  isosorbide mononitrate (IMDUR) 30 MG 24 hr tablet Take 1 tablet (30 mg total) by mouth daily. 03/24/15  Yes Dhungel, Nishant, MD  levothyroxine (SYNTHROID, LEVOTHROID) 50 MCG tablet Take 50 mcg by mouth daily.  01/16/15  Yes [provider]  loratadine (CLARITIN) 10 MG tablet Take 10 mg by mouth daily.  Yes [provider]  meclizine (ANTIVERT) 25 MG tablet Take 1 tablet (25 mg total) by mouth 3 (three) times daily as needed for dizziness. 10/27/12  Yes Delora Fuel, MD  Menthol-Methyl Salicylate (ICY HOT) 78-29 % STCK Apply 1 application topically 2 (two) times daily as needed (knee pain.).   Yes [provider]  montelukast (SINGULAIR) 10 MG tablet Take 10 mg by mouth at bedtime.   Yes [provider]  ondansetron (ZOFRAN) 4 MG tablet Take 1 tablet (4 mg total) by mouth every 6 (six) hours as needed for nausea or vomiting. 5/62/13  Yes Delora Fuel, MD  oxymetazoline (AFRIN) 0.05 % nasal spray Place 1 spray into both nostrils 2 (two) times daily as needed (for nose bleeds). Reported on 12/23/2015   Yes [provider]  potassium chloride SA (K-DUR,KLOR-CON) 20 MEQ tablet Take 20 mEq by mouth daily.    Yes [provider]  ranitidine (ZANTAC) 150 MG tablet Take 150 mg by mouth daily.   Yes [provider]  carvedilol (COREG) 3.125 MG tablet Take 2 tablets (6.25 mg total) by mouth 2 (two) times daily with a meal. Patient not taking: Reported on 06/23/2018 11/02/17   Mariel Aloe, MD  colchicine 0.6 MG tablet Take 1 tablet (0.6 mg total) by  mouth 2 (two) times daily. Patient not taking: Reported on 06/23/2018 05/19/18   Jola Schmidt, MD  diclofenac sodium (VOLTAREN) 1 % GEL Apply 4 g topically 4 (four) times daily. 06/23/18   Deno Etienne, DO    Family History Family History  Problem Relation Age of Onset  . CAD Unknown   . Hypertension Unknown   . Stroke Unknown   . Colon cancer Neg Hx   . GI Bleed Neg Hx     Social History Social History   Tobacco Use  . Smoking status: Never Smoker  . Smokeless tobacco: Never Used  Substance Use Topics  . Alcohol use: No  . Drug use: No     Allergies   Patient has no known allergies.   Review of Systems Review of Systems  Constitutional: Negative for chills and fever.  HENT: Negative for congestion and rhinorrhea.   Eyes: Negative for redness and visual disturbance.  Respiratory: Negative for shortness of breath and wheezing.   Cardiovascular: Negative for chest pain and palpitations.  Gastrointestinal: Positive for abdominal pain. Negative for nausea and vomiting.  Genitourinary: Negative for dysuria and urgency.  Musculoskeletal: Positive for arthralgias and myalgias.  Skin: Negative for pallor and wound.  Neurological: Negative for dizziness and headaches.     Physical Exam Updated Vital Signs BP (!) 141/72   Pulse 95   Temp 98.5 F (36.9 C) (Oral)   Resp 17   Ht 5\' 6"  (1.676 m)   Wt 96.6 kg   SpO2 96%   BMI 34.38 kg/m   Physical Exam  Constitutional: She is oriented to person, place, and time. She appears well-developed and well-nourished. No distress.  HENT:  Head: Normocephalic and atraumatic.  Eyes: Pupils are equal, round, and reactive to light. EOM are normal.  Neck: Normal range of motion. Neck supple.  Cardiovascular: Normal rate and regular rhythm. Exam reveals no gallop and no friction rub.  No murmur heard. Pulmonary/Chest: Effort normal. She has no wheezes. She has no rales.  Abdominal: Soft. She exhibits no distension. There is  tenderness (severe, diffuse along the lower abdomen).  Musculoskeletal: She exhibits tenderness. She exhibits no edema.  Mild tenderness to the  lateral malleolus where the ATF attaches.  No appreciable edema.  No erythema.  No pain to the base of the fifth metatarsal, no pain to the navicular.  No midline spinal tenderness.  She is able to turn her head 45 degrees in either direction without pain.  Neurological: She is alert and oriented to person, place, and time.  Skin: Skin is warm and dry. She is not diaphoretic.  Psychiatric: She has a normal mood and affect. Her behavior is normal.  Nursing note and vitals reviewed.    ED Treatments / Results  Labs (all labs ordered are listed, but only abnormal results are displayed) Labs Reviewed  CBC WITH DIFFERENTIAL/PLATELET - Abnormal; Notable for the following components:      Result Value   RBC 3.30 (*)    Hemoglobin 9.2 (*)    HCT 29.8 (*)    All other components within normal limits  COMPREHENSIVE METABOLIC PANEL - Abnormal; Notable for the following components:   Glucose, Bld 101 (*)    BUN 41 (*)    Creatinine, Ser 1.72 (*)    GFR calc non Af Amer 26 (*)    GFR calc Af Amer 31 (*)    All other components within normal limits  I-STAT CHEM 8, ED - Abnormal; Notable for the following components:   Potassium 6.1 (*)    BUN 59 (*)    Creatinine, Ser 1.80 (*)    Glucose, Bld 103 (*)    Calcium, Ion 1.09 (*)    Hemoglobin 9.5 (*)    HCT 28.0 (*)    All other components within normal limits  LIPASE, BLOOD    EKG EKG Interpretation  Date/Time:  Sunday June 23 2018 17:34:18 EDT Ventricular Rate:  94 PR Interval:    QRS Duration: 165 QT Interval:  405 QTC Calculation: 507 R Axis:   95 Text Interpretation:  Ventricular-paced rhythm No further analysis attempted due to paced rhythm No significant change since last tracing Confirmed by Deno Etienne [phone]) on 06/23/2018 5:54:34 PM   Radiology Ct Abdomen Pelvis Wo  Contrast  Result Date: 06/23/2018 CLINICAL DATA:  Epigastric pain and unable to have bowel movement. EXAM: CT ABDOMEN AND PELVIS WITHOUT CONTRAST TECHNIQUE: Multidetector CT imaging of the abdomen and pelvis was performed following the standard protocol without IV contrast. COMPARISON:  12/13/2015 and 07/27/2015 as well as 04/15/2009. FINDINGS: Lower chest: No focal airspace consolidation or effusion. Calcified 1 cm pleural nodule over the lateral right midlung unchanged. Mild cardiomegaly with cardiac pacer leads present. Calcified plaque over the descending thoracic aorta. Hepatobiliary: Mild cholelithiasis. Liver and biliary tree are normal. Pancreas: Normal. Spleen: Normal. Adrenals/Urinary Tract: Adrenal glands are normal. Kidneys are normal in size without hydronephrosis or nephrolithiasis. Punctate calcification over the lower right renal hilum likely vascular. Subcentimeter hypodensity over the lower pole cortex of the right kidney too small to characterize but likely a cyst. Ureters and bladder are normal. Stomach/Bowel: Stomach and small bowel are normal. Appendix not visualized. Ostomy site over the left abdomen unchanged. Short segment of small bowel over the ostomy site unchanged. Mild fecal retention over the rectosigmoid colon. Colonic diverticulosis. Vascular/Lymphatic: Mild calcified plaque over the abdominal aorta and iliac arteries. Few stable iliac chain/pelvic sidewall lymph nodes unchanged. Reproductive: Previous hysterectomy. Other: No free fluid or focal inflammatory change. Musculoskeletal: Degenerative change of the spine and hips. IMPRESSION: No acute findings in the abdomen/pelvis. Colonic diverticulosis. Colostomy site over the left abdomen unchanged. Minimal cholelithiasis. Subcentimeter right renal cortical hypodensity  too small to characterize but likely a cyst. Aortic Atherosclerosis (ICD10-I70.0). Electronically Signed   By: Marin Olp M.D.   On: 06/23/2018 17:51   Dg Ankle  Complete Left  Result Date: 06/23/2018 CLINICAL DATA:  Fall today with lateral left ankle pain. EXAM: LEFT ANKLE COMPLETE - 3+ VIEW COMPARISON:  02/11/2009 and 02/12/2009 FINDINGS: Examination demonstrates 2 orthopedic screws bridging the medial malleolus intact and unchanged. Lateral fixation plate and screws over the fibula intact and unchanged. Ankle mortise is within normal. There mild degenerate changes over the ankle. Partially visualized K-wire fixation of the first metatarsal. No acute fracture or dislocation. Inferior calcaneal spur. IMPRESSION: No acute findings. Old distal tibia and fibular fractures post fixation with hardware intact. Degenerative changes over the midfoot/hindfoot. Electronically Signed   By: Marin Olp M.D.   On: 06/23/2018 15:37    Procedures Procedures (including critical care time)  Medications Ordered in ED Medications  morphine 2 MG/ML injection 2 mg (2 mg Intravenous Given 06/23/18 1616)  ondansetron (ZOFRAN) injection 4 mg (4 mg Intravenous Given 06/23/18 1614)  sodium chloride 0.9 % bolus 1,000 mL ( Intravenous Stopped 06/23/18 1729)     Initial Impression / Assessment and Plan / ED Course  I have reviewed the triage vital signs and the nursing notes.  Pertinent labs & imaging results that were available during my care of the patient were reviewed by me and considered in my medical decision making (see chart for details).     84 yo F with a chief complaint of mechanical fall.  On review of systems however the patient did note that she had been having severe lower abdominal pain going on for the past week.  She is exquisitely tender on my exam.  Will obtain a CT scan labs to further evaluate.  Plain film of the left ankle reviewed by me negative for fracture.  CT scan of the abdomen pelvis is negative for acute pathology.  The patient's potassium was elevated on i-STAT lab and EKG was then performed which was unchanged from prior.  Patient's formal  laboratory potassium is normal.  Renal function is at baseline. ASO.  D/c home.   5:57 PM:  I have discussed the diagnosis/risks/treatment options with the patient and family and believe the pt to be eligible for discharge home to follow-up with PCP. We also discussed returning to the ED immediately if new or worsening sx occur. We discussed the sx which are most concerning (e.g., sudden worsening pain, fever, inability to tolerate by mouth) that necessitate immediate return. Medications administered to the patient during their visit and any new prescriptions provided to the patient are listed below.  Medications given during this visit Medications  morphine 2 MG/ML injection 2 mg (2 mg Intravenous Given 06/23/18 1616)  ondansetron (ZOFRAN) injection 4 mg (4 mg Intravenous Given 06/23/18 1614)  sodium chloride 0.9 % bolus 1,000 mL ( Intravenous Stopped 06/23/18 1729)     The patient appears reasonably screen and/or stabilized for discharge and I doubt any other medical condition or other [facility] requiring further screening, evaluation, or treatment in the ED at this time prior to discharge.    Final Clinical Impressions(s) / ED Diagnoses   Final diagnoses:  Lower abdominal pain  Sprain of anterior talofibular ligament of left ankle, initial encounter    ED Discharge Orders         Ordered    diclofenac sodium (VOLTAREN) 1 % GEL  4 times daily     06/23/18  [place], [place], DO 06/23/18 1757

## 2018-06-23 NOTE — ED Notes (Signed)
Bed: WA03 Expected date:  Expected time:  Means of arrival:  Comments: 84 yo fall, ankle pain

## 2018-06-23 NOTE — ED Notes (Addendum)
Gardiner Coins (son): [phone] wants updates

## 2018-08-21 ENCOUNTER — Inpatient Hospital Stay (HOSPITAL_COMMUNITY): Payer: Medicare HMO

## 2018-08-21 ENCOUNTER — Inpatient Hospital Stay (HOSPITAL_COMMUNITY)
Admission: EM | Admit: 2018-08-21 | Discharge: 2018-08-26 | DRG: 389 | Disposition: A | Discharge status: Skilled Nursing Facility | Payer: Medicare HMO | Attending: Internal Medicine | Admitting: Internal Medicine

## 2018-08-21 ENCOUNTER — Emergency Department (HOSPITAL_COMMUNITY): Payer: Medicare HMO

## 2018-08-21 ENCOUNTER — Other Ambulatory Visit: Payer: Self-pay

## 2018-08-21 ENCOUNTER — Encounter (HOSPITAL_COMMUNITY): Payer: Self-pay

## 2018-08-21 DIAGNOSIS — R110 Nausea: Secondary | ICD-10-CM | POA: Diagnosis present

## 2018-08-21 DIAGNOSIS — E785 Hyperlipidemia, unspecified: Secondary | ICD-10-CM | POA: Diagnosis present

## 2018-08-21 DIAGNOSIS — G309 Alzheimer's disease, unspecified: Secondary | ICD-10-CM | POA: Diagnosis not present

## 2018-08-21 DIAGNOSIS — R011 Cardiac murmur, unspecified: Secondary | ICD-10-CM | POA: Diagnosis present

## 2018-08-21 DIAGNOSIS — M1990 Unspecified osteoarthritis, unspecified site: Secondary | ICD-10-CM | POA: Diagnosis present

## 2018-08-21 DIAGNOSIS — I130 Hypertensive heart and chronic kidney disease with heart failure and stage 1 through stage 4 chronic kidney disease, or unspecified chronic kidney disease: Secondary | ICD-10-CM | POA: Diagnosis present

## 2018-08-21 DIAGNOSIS — C20 Malignant neoplasm of rectum: Secondary | ICD-10-CM

## 2018-08-21 DIAGNOSIS — I5042 Chronic combined systolic (congestive) and diastolic (congestive) heart failure: Secondary | ICD-10-CM | POA: Diagnosis present

## 2018-08-21 DIAGNOSIS — D631 Anemia in chronic kidney disease: Secondary | ICD-10-CM | POA: Diagnosis present

## 2018-08-21 DIAGNOSIS — F0390 Unspecified dementia without behavioral disturbance: Secondary | ICD-10-CM | POA: Diagnosis present

## 2018-08-21 DIAGNOSIS — N184 Chronic kidney disease, stage 4 (severe): Secondary | ICD-10-CM

## 2018-08-21 DIAGNOSIS — N183 Chronic kidney disease, stage 3 unspecified: Secondary | ICD-10-CM | POA: Diagnosis present

## 2018-08-21 DIAGNOSIS — Z933 Colostomy status: Secondary | ICD-10-CM | POA: Diagnosis not present

## 2018-08-21 DIAGNOSIS — Z79890 Hormone replacement therapy (postmenopausal): Secondary | ICD-10-CM

## 2018-08-21 DIAGNOSIS — E1122 Type 2 diabetes mellitus with diabetic chronic kidney disease: Secondary | ICD-10-CM | POA: Diagnosis present

## 2018-08-21 DIAGNOSIS — J439 Emphysema, unspecified: Secondary | ICD-10-CM | POA: Diagnosis not present

## 2018-08-21 DIAGNOSIS — Z86711 Personal history of pulmonary embolism: Secondary | ICD-10-CM

## 2018-08-21 DIAGNOSIS — J449 Chronic obstructive pulmonary disease, unspecified: Secondary | ICD-10-CM | POA: Diagnosis present

## 2018-08-21 DIAGNOSIS — Z85048 Personal history of other malignant neoplasm of rectum, rectosigmoid junction, and anus: Secondary | ICD-10-CM

## 2018-08-21 DIAGNOSIS — Z95810 Presence of automatic (implantable) cardiac defibrillator: Secondary | ICD-10-CM

## 2018-08-21 DIAGNOSIS — Z961 Presence of intraocular lens: Secondary | ICD-10-CM | POA: Diagnosis present

## 2018-08-21 DIAGNOSIS — K433 Parastomal hernia with obstruction, without gangrene: Secondary | ICD-10-CM | POA: Diagnosis not present

## 2018-08-21 DIAGNOSIS — Z90710 Acquired absence of both cervix and uterus: Secondary | ICD-10-CM | POA: Diagnosis not present

## 2018-08-21 DIAGNOSIS — K435 Parastomal hernia without obstruction or  gangrene: Secondary | ICD-10-CM | POA: Diagnosis present

## 2018-08-21 DIAGNOSIS — Z950 Presence of cardiac pacemaker: Secondary | ICD-10-CM | POA: Diagnosis present

## 2018-08-21 DIAGNOSIS — E039 Hypothyroidism, unspecified: Secondary | ICD-10-CM | POA: Diagnosis present

## 2018-08-21 DIAGNOSIS — M109 Gout, unspecified: Secondary | ICD-10-CM | POA: Diagnosis present

## 2018-08-21 DIAGNOSIS — Z0189 Encounter for other specified special examinations: Secondary | ICD-10-CM

## 2018-08-21 DIAGNOSIS — Z9842 Cataract extraction status, left eye: Secondary | ICD-10-CM | POA: Diagnosis not present

## 2018-08-21 DIAGNOSIS — Z9841 Cataract extraction status, right eye: Secondary | ICD-10-CM

## 2018-08-21 DIAGNOSIS — K219 Gastro-esophageal reflux disease without esophagitis: Secondary | ICD-10-CM | POA: Diagnosis present

## 2018-08-21 DIAGNOSIS — F0280 Dementia in other diseases classified elsewhere without behavioral disturbance: Secondary | ICD-10-CM | POA: Diagnosis not present

## 2018-08-21 DIAGNOSIS — K56609 Unspecified intestinal obstruction, unspecified as to partial versus complete obstruction: Principal | ICD-10-CM | POA: Diagnosis present

## 2018-08-21 DIAGNOSIS — Z79899 Other long term (current) drug therapy: Secondary | ICD-10-CM

## 2018-08-21 DIAGNOSIS — Z7951 Long term (current) use of inhaled steroids: Secondary | ICD-10-CM

## 2018-08-21 DIAGNOSIS — Z7982 Long term (current) use of aspirin: Secondary | ICD-10-CM

## 2018-08-21 HISTORY — DX: Pneumonia, unspecified organism: J18.9

## 2018-08-21 LAB — URINALYSIS, ROUTINE W REFLEX MICROSCOPIC
Bilirubin Urine: NEGATIVE
GLUCOSE, UA: NEGATIVE mg/dL
Hgb urine dipstick: NEGATIVE
Ketones, ur: NEGATIVE mg/dL
NITRITE: NEGATIVE
PROTEIN: NEGATIVE mg/dL
Specific Gravity, Urine: 1.013 (ref 1.005–1.030)
pH: 5 (ref 5.0–8.0)

## 2018-08-21 LAB — COMPREHENSIVE METABOLIC PANEL
ALBUMIN: 3.6 g/dL (ref 3.5–5.0)
ALK PHOS: 81 U/L (ref 38–126)
ALT: 24 U/L (ref 0–44)
ANION GAP: 8 (ref 5–15)
AST: 19 U/L (ref 15–41)
BUN: 45 mg/dL — ABNORMAL HIGH (ref 8–23)
CALCIUM: 9.7 mg/dL (ref 8.9–10.3)
CO2: 32 mmol/L (ref 22–32)
Chloride: 101 mmol/L (ref 98–111)
Creatinine, Ser: 1.83 mg/dL — ABNORMAL HIGH (ref 0.44–1.00)
GFR calc Af Amer: 28 mL/min — ABNORMAL LOW (ref >60)
GFR, EST NON AFRICAN AMERICAN: 25 mL/min — AB (ref >60)
GLUCOSE: 99 mg/dL (ref 70–99)
Potassium: 5 mmol/L (ref 3.5–5.1)
Sodium: 141 mmol/L (ref 135–145)
TOTAL PROTEIN: 8.6 g/dL — AB (ref 6.5–8.1)
Total Bilirubin: 0.2 mg/dL — ABNORMAL LOW (ref 0.3–1.2)

## 2018-08-21 LAB — CBC WITH DIFFERENTIAL/PLATELET
Abs Immature Granulocytes: 0.05 10*3/uL (ref 0.00–0.07)
Basophils Absolute: 0 10*3/uL (ref 0.0–0.1)
Basophils Relative: 0 %
EOS PCT: 0 %
Eosinophils Absolute: 0 10*3/uL (ref 0.0–0.5)
HEMATOCRIT: 31.5 % — AB (ref 36.0–46.0)
HEMOGLOBIN: 9.2 g/dL — AB (ref 12.0–15.0)
Immature Granulocytes: 1 %
LYMPHS ABS: 1.4 10*3/uL (ref 0.7–4.0)
LYMPHS PCT: 14 %
MCH: 27.2 pg (ref 26.0–34.0)
MCHC: 29.2 g/dL — AB (ref 30.0–36.0)
MCV: 93.2 fL (ref 80.0–100.0)
MONO ABS: 0.9 10*3/uL (ref 0.1–1.0)
Monocytes Relative: 9 %
Neutro Abs: 7.2 10*3/uL (ref 1.7–7.7)
Neutrophils Relative %: 76 %
Platelets: 307 10*3/uL (ref 150–400)
RBC: 3.38 MIL/uL — ABNORMAL LOW (ref 3.87–5.11)
RDW: 15 % (ref 11.5–15.5)
WBC: 9.5 10*3/uL (ref 4.0–10.5)
nRBC: 0 % (ref 0.0–0.2)

## 2018-08-21 LAB — LIPASE, BLOOD: Lipase: 24 U/L (ref 11–51)

## 2018-08-21 MED ORDER — HYDROCORTISONE 1 % EX CREA: 1.0000 "application " | TOPICAL_CREAM | Freq: Three times a day (TID) | CUTANEOUS | Status: DC | PRN

## 2018-08-21 MED ORDER — LACTATED RINGERS IV BOLUS: 1000.0000 mL | Freq: Once | INTRAVENOUS | Status: DC

## 2018-08-21 MED ORDER — ACETAMINOPHEN 650 MG RE SUPP: 650.0000 mg | Freq: Four times a day (QID) | RECTAL | Status: DC | PRN

## 2018-08-21 MED ORDER — HEPARIN SODIUM (PORCINE) 5000 UNIT/ML IJ SOLN
5000.0000 [IU] | Freq: Three times a day (TID) | INTRAMUSCULAR | Status: DC
  Administered 2018-08-21 – 2018-08-26 (×15): 5000 [IU] via SUBCUTANEOUS
  Filled 2018-08-21 (×15): qty 1

## 2018-08-21 MED ORDER — ONDANSETRON HCL 4 MG/2ML IJ SOLN: 4.0000 mg | Freq: Once | INTRAMUSCULAR | Status: DC

## 2018-08-21 MED ORDER — MOMETASONE FURO-FORMOTEROL FUM 200-5 MCG/ACT IN AERO
2.0000 | INHALATION_SPRAY | Freq: Two times a day (BID) | RESPIRATORY_TRACT | Status: DC
  Administered 2018-08-22 – 2018-08-26 (×9): 2 via RESPIRATORY_TRACT
  Filled 2018-08-21: qty 8.8

## 2018-08-21 MED ORDER — SODIUM CHLORIDE 0.9% FLUSH
3.0000 mL | Freq: Two times a day (BID) | INTRAVENOUS | Status: DC
  Administered 2018-08-23 – 2018-08-25 (×3): 3 mL via INTRAVENOUS

## 2018-08-21 MED ORDER — DIATRIZOATE MEGLUMINE & SODIUM 66-10 % PO SOLN
90.0000 mL | Freq: Once | ORAL | Status: AC
  Administered 2018-08-21: 90 mL via NASOGASTRIC
  Filled 2018-08-21: qty 90

## 2018-08-21 MED ORDER — SODIUM CHLORIDE 0.9% FLUSH: 3.0000 mL | INTRAVENOUS | Status: DC | PRN

## 2018-08-21 MED ORDER — GUAIFENESIN-DM 100-10 MG/5ML PO SYRP: 10.0000 mL | ORAL_SOLUTION | ORAL | Status: DC | PRN

## 2018-08-21 MED ORDER — MORPHINE SULFATE (PF) 2 MG/ML IV SOLN
2.0000 mg | Freq: Once | INTRAVENOUS | Status: DC
  Filled 2018-08-21: qty 1

## 2018-08-21 MED ORDER — ACETAMINOPHEN 325 MG PO TABS
650.0000 mg | ORAL_TABLET | Freq: Four times a day (QID) | ORAL | Status: DC | PRN
  Administered 2018-08-23 – 2018-08-26 (×5): 650 mg via ORAL
  Filled 2018-08-21 (×5): qty 2

## 2018-08-21 MED ORDER — HYDRALAZINE HCL 20 MG/ML IJ SOLN: 10.0000 mg | Freq: Three times a day (TID) | INTRAMUSCULAR | Status: DC | PRN

## 2018-08-21 MED ORDER — PANTOPRAZOLE SODIUM 40 MG IV SOLR
40.0000 mg | INTRAVENOUS | Status: DC
  Administered 2018-08-22 – 2018-08-26 (×5): 40 mg via INTRAVENOUS
  Filled 2018-08-21 (×6): qty 40

## 2018-08-21 MED ORDER — MAGIC MOUTHWASH
15.0000 mL | Freq: Four times a day (QID) | ORAL | Status: DC | PRN
  Filled 2018-08-21: qty 15

## 2018-08-21 MED ORDER — LEVOTHYROXINE SODIUM 100 MCG IV SOLR
25.0000 ug | Freq: Every day | INTRAVENOUS | Status: DC
  Administered 2018-08-22 – 2018-08-25 (×4): 25 ug via INTRAVENOUS
  Filled 2018-08-21 (×4): qty 5

## 2018-08-21 MED ORDER — MENTHOL 3 MG MT LOZG
1.0000 | LOZENGE | OROMUCOSAL | Status: DC | PRN
  Filled 2018-08-21: qty 9

## 2018-08-21 MED ORDER — SODIUM CHLORIDE 0.9 % IV BOLUS
1000.0000 mL | Freq: Once | INTRAVENOUS | Status: AC
  Administered 2018-08-21: 1000 mL via INTRAVENOUS

## 2018-08-21 MED ORDER — PHENOL 1.4 % MT LIQD
1.0000 | OROMUCOSAL | Status: DC | PRN
  Filled 2018-08-21: qty 177

## 2018-08-21 MED ORDER — SODIUM CHLORIDE 0.9 % IV SOLN
INTRAVENOUS | Status: DC
  Administered 2018-08-21 – 2018-08-23 (×3): via INTRAVENOUS

## 2018-08-21 MED ORDER — HYDROCORTISONE 2.5 % RE CREA: 1.0000 "application " | TOPICAL_CREAM | Freq: Four times a day (QID) | RECTAL | Status: DC | PRN

## 2018-08-21 MED ORDER — ONDANSETRON HCL 4 MG/2ML IJ SOLN
4.0000 mg | Freq: Four times a day (QID) | INTRAMUSCULAR | Status: DC | PRN
  Administered 2018-08-22 – 2018-08-24 (×2): 4 mg via INTRAVENOUS
  Filled 2018-08-21 (×2): qty 2

## 2018-08-21 MED ORDER — ALBUTEROL SULFATE (2.5 MG/3ML) 0.083% IN NEBU: 2.5000 mg | INHALATION_SOLUTION | RESPIRATORY_TRACT | Status: DC | PRN

## 2018-08-21 MED ORDER — LIP MEDEX EX OINT
1.0000 "application " | TOPICAL_OINTMENT | Freq: Two times a day (BID) | CUTANEOUS | Status: DC
  Administered 2018-08-22 – 2018-08-26 (×9): 1 via TOPICAL
  Filled 2018-08-21 (×2): qty 7

## 2018-08-21 MED ORDER — POLYVINYL ALCOHOL 1.4 % OP SOLN
1.0000 [drp] | Freq: Three times a day (TID) | OPHTHALMIC | Status: DC
  Administered 2018-08-22 – 2018-08-26 (×14): 1 [drp] via OPHTHALMIC
  Filled 2018-08-21: qty 15

## 2018-08-21 MED ORDER — ALUM & MAG HYDROXIDE-SIMETH 200-200-20 MG/5ML PO SUSP: 30.0000 mL | Freq: Four times a day (QID) | ORAL | Status: DC | PRN

## 2018-08-21 MED ORDER — SODIUM CHLORIDE 0.9 % IV SOLN: 250.0000 mL | INTRAVENOUS | Status: DC | PRN

## 2018-08-21 MED ORDER — ONDANSETRON HCL 4 MG PO TABS: 4.0000 mg | ORAL_TABLET | Freq: Four times a day (QID) | ORAL | Status: DC | PRN

## 2018-08-21 NOTE — ED Provider Notes (Signed)
[facility] DEPT Provider Note   CSN: [id number] Arrival date & time: 08/21/18  1203     History   Chief Complaint Chief Complaint  Patient presents with  . Nausea    HPI [name] is a 84 y.o. female.  Pt presents to the ED today with nausea.  She said it has been going on since yesterday.  She did have 1 emesis last night.  The pt has had slightly decreased output from colostomy.  She has pain in her right hand and left ankle from gout, but this is not new.       Past Medical History:  Diagnosis Date  . Anginal pain ([place])   . Arthritis    "comes and goes" (03/01/2015)  . Asthma   . CHF (congestive heart failure) ([place])   . Colon cancer ([place])   . Colostomy care ([place])   . COPD (chronic obstructive pulmonary disease) ([place])   . Diabetes mellitus without complication ([place])    pt denies this hx on 03/01/2015  . Heart murmur   . History of blood transfusion 03/01/2015; 04/23/2017   "related to anemia,"  . Hyperlipidemia   . Hypertension   . Hypothyroidism   . Pneumonia    "just once" (03/01/2015)  . Pneumonia 04/23/2017  . Presence of combination internal cardiac defibrillator (ICD) and pacemaker    2005 in [facility]  . Pulmonary embolism ([place])   . Renal disorder     Patient Active Problem List   Diagnosis Date Noted  . Chest pain 10/28/2017  . Aspiration of liquid 10/28/2017  . Bilateral lower extremity edema   . Pneumonia 04/22/2017  . HCAP (healthcare-associated pneumonia) 04/22/2017  . Altered mental status   . Hypotension 02/16/2017  . Syncope 02/02/2016  . Faintness   . Essential hypertension 11/16/2015  . Hypothyroidism 11/16/2015  . Acute on chronic systolic CHF (congestive heart failure) ([place]) 11/15/2015  . Hypertensive heart disease 10/04/2015  . Elevated troponin 10/04/2015  . Acute respiratory failure ([place])   . Acute systolic congestive heart failure ([place])   . NICM (nonischemic cardiomyopathy) ([place]) 08/11/2015  .  Normal coronary arteries 08/11/2015  . Dyslipidemia 07/27/2015  . CKD (chronic kidney disease) stage 3, GFR 30-59 ml/min (HCC) 07/27/2015  . Anemia 07/27/2015  . Benign essential HTN 07/27/2015  . Hypothyroidism, adult 07/27/2015  . FUO (fever of unknown origin) 07/27/2015  . IDA (iron deficiency anemia) 07/27/2015  . Chronic systolic CHF (congestive heart failure) ([place]) 07/27/2015  . Colon cancer ([place]) 07/27/2015  . Sepsis ([place]) 07/24/2015  . Acute encephalopathy 07/24/2015  . Hypokalemia 07/24/2015  . Dementia ([place]) 07/23/2015  . COPD (chronic obstructive pulmonary disease) ([place]) 04/25/2015  . ICD in place 04/14/2015  . Colostomy in place [facility]) 03/01/2015    Past Surgical History:  Procedure Laterality Date  . ABDOMINAL HYSTERECTOMY    . ANKLE FRACTURE SURGERY Left   . CARDIAC CATHETERIZATION N/A 03/22/2015   Procedure: Right/Left Heart Cath and Coronary Angiography;  Surgeon: Leonie Man, MD;  Location: [facility];  Service: Cardiovascular;  Laterality: N/A;  . CATARACT EXTRACTION W/ INTRAOCULAR LENS  IMPLANT, BILATERAL Bilateral   . CHOLECYSTECTOMY    . COLON SURGERY     cancer  . DILATION AND CURETTAGE OF UTERUS    . FRACTURE SURGERY    . TUBAL LIGATION       OB History   None      Home Medications    Prior to  Admission medications   Medication Sig Start Date End Date Taking? Authorizing Provider  acetaminophen (TYLENOL) 500 MG tablet Take 1,000 mg by mouth 3 (three) times daily.     [provider]  albuterol (PROVENTIL) (2.5 MG/3ML) 0.083% nebulizer solution Take 3 mLs (2.5 mg total) by nebulization every 4 (four) hours as needed for wheezing. 04/28/15   Geradine Girt, DO  aspirin EC 81 MG tablet Take 1 tablet (81 mg total) by mouth every morning. 03/03/15   Rai, Ripudeep K, MD  atorvastatin (LIPITOR) 20 MG tablet Take 20 mg by mouth daily.     [provider]  carboxymethylcellulose (REFRESH) 1 % ophthalmic solution Place 1 drop  into both eyes 3 (three) times daily.    [provider]  carvedilol (COREG) 3.125 MG tablet Take 2 tablets (6.25 mg total) by mouth 2 (two) times daily with a meal. Patient not taking: Reported on 06/23/2018 11/02/17   Mariel Aloe, MD  carvedilol (COREG) 6.25 MG tablet Take 6.25 mg by mouth 2 (two) times daily with a meal.    [provider]  colchicine 0.6 MG tablet Take 1 tablet (0.6 mg total) by mouth 2 (two) times daily. Patient not taking: Reported on 06/23/2018 05/19/18   Jola Schmidt, MD  cyclobenzaprine (FLEXERIL) 5 MG tablet Take 5 mg by mouth 2 (two) times daily as needed for muscle spasms.    [provider]  cyclobenzaprine (FLEXERIL) 5 MG tablet Take 5 mg by mouth at bedtime.    [provider]  diclofenac sodium (VOLTAREN) 1 % GEL Apply 4 g topically 4 (four) times daily. 06/23/18   Deno Etienne, DO  Fluticasone-Salmeterol (ADVAIR) 500-50 MCG/DOSE AEPB Inhale 1 puff into the lungs 2 (two) times daily. Rinse mouth after use    [provider]  furosemide (LASIX) 20 MG tablet Take 20 mg by mouth daily as needed (Weight gain of more >3 lbs in 24 hours.).    [provider]  furosemide (LASIX) 40 MG tablet Take 1 tablet (40 mg total) by mouth daily. Patient taking differently: Take 40 mg by mouth 2 (two) times daily.  12/30/15   Shirley Friar, PA-C  furosemide (LASIX) 40 MG tablet Take 40 mg by mouth 2 (two) times daily.    [provider]  hydrALAZINE (APRESOLINE) 25 MG tablet Take 0.5 tablets (12.5 mg total) by mouth 3 (three) times daily. Patient taking differently: Take 25 mg by mouth 3 (three) times daily.  12/23/15   Clegg, Amy D, NP  HYDROcodone-acetaminophen (NORCO/VICODIN) 5-325 MG tablet Take 1 tablet by mouth every 4 (four) hours as needed for moderate pain. 05/19/18   Jola Schmidt, MD  iron polysaccharides (FERREX 150) 150 MG capsule Take 150 mg by mouth 2 (two) times daily.    [provider]    isosorbide mononitrate (IMDUR) 30 MG 24 hr tablet Take 1 tablet (30 mg total) by mouth daily. 03/24/15   Dhungel, Nishant, MD  levothyroxine (SYNTHROID, LEVOTHROID) 50 MCG tablet Take 50 mcg by mouth daily.  01/16/15   [provider]  loratadine (CLARITIN) 10 MG tablet Take 10 mg by mouth daily.    [provider]  meclizine (ANTIVERT) 25 MG tablet Take 1 tablet (25 mg total) by mouth 3 (three) times daily as needed for dizziness. 2/40/97   Delora Fuel, MD  Menthol-Methyl Salicylate (ICY HOT) 35-32 % STCK Apply 1 application topically 2 (two) times daily as needed (knee pain.).    [provider]  montelukast (SINGULAIR) 10 MG tablet Take 10 mg by mouth at bedtime.    [provider]  ondansetron (ZOFRAN) 4 MG tablet Take 1 tablet (4 mg total) by mouth every 6 (six) hours as needed for nausea or vomiting. 6/71/24   Delora Fuel, MD  oxymetazoline (AFRIN) 0.05 % nasal spray Place 1 spray into both nostrils 2 (two) times daily as needed (for nose bleeds). Reported on 12/23/2015    [provider]  potassium chloride SA (K-DUR,KLOR-CON) 20 MEQ tablet Take 20 mEq by mouth daily.     [provider]  ranitidine (ZANTAC) 150 MG tablet Take 150 mg by mouth daily.    [provider]    Family History Family History  Problem Relation Age of Onset  . CAD Unknown   . Hypertension Unknown   . Stroke Unknown   . Colon cancer Neg Hx   . GI Bleed Neg Hx     Social History Social History   Tobacco Use  . Smoking status: Never Smoker  . Smokeless tobacco: Never Used  Substance Use Topics  . Alcohol use: No  . Drug use: No     Allergies   Patient has no known allergies.   Review of Systems Review of Systems  Gastrointestinal: Positive for abdominal pain and nausea.  All other systems reviewed and are negative.    Physical Exam Updated Vital Signs BP (!) 149/63 (BP Location: Right Arm)   Pulse 98   Temp 98.4 F (36.9 C)  (Oral)   Resp 14   Ht 5\' 6"  (1.676 m)   Wt 96.6 kg   SpO2 95%   BMI 34.38 kg/m   Physical Exam  Constitutional: She is oriented to person, place, and time. She appears well-developed and well-nourished.  HENT:  Head: Normocephalic and atraumatic.  Right Ear: External ear normal.  Left Ear: External ear normal.  Nose: Nose normal.  Mouth/Throat: Oropharynx is clear and moist.  Eyes: Pupils are equal, round, and reactive to light. Conjunctivae and EOM are normal.  Neck: Normal range of motion. Neck supple.  Cardiovascular: Normal rate, regular rhythm, normal heart sounds and intact distal pulses.  Pulmonary/Chest: Effort normal and breath sounds normal.  Abdominal: Soft. Bowel sounds are normal.  Musculoskeletal: Normal range of motion.  Right hand:  1st and 2nd mcp joint red and swollen c/w gout  Left ankle:  No deformity  Neurological: She is alert and oriented to person, place, and time.  Skin: Skin is warm. Capillary refill takes less than 2 seconds.  Psychiatric: She has a normal mood and affect. Her behavior is normal. Judgment and thought content normal.  Nursing note and vitals reviewed.    ED Treatments / Results  Labs (all labs ordered are listed, but only abnormal results are displayed) Labs Reviewed  CBC WITH DIFFERENTIAL/PLATELET - Abnormal; Notable for the following components:      Result Value   RBC 3.38 (*)    Hemoglobin 9.2 (*)    HCT 31.5 (*)    MCHC 29.2 (*)    All other components within normal limits  COMPREHENSIVE METABOLIC PANEL - Abnormal; Notable for the following components:   BUN 45 (*)    Creatinine, Ser 1.83 (*)    Total Protein 8.6 (*)    Total Bilirubin 0.2 (*)    GFR calc non Af Amer 25 (*)    GFR calc Af Amer 28 (*)    All other components within normal limits  LIPASE, BLOOD  URINALYSIS, ROUTINE W REFLEX MICROSCOPIC    EKG EKG Interpretation  Date/Time:  Wednesday August 21 2018 12:38:09 EST Ventricular Rate:  97 PR  Interval:    QRS Duration: 190 QT Interval:  426 QTC Calculation: 542 R Axis:   108 Text Interpretation:  Sinus rhythm Ventricular-paced rhythm Baseline wander in lead(s) V2 No significant change since last tracing Confirmed by Isla Pence [phone]) on 08/21/2018 12:50:58 PM Also confirmed by Isla Pence [phone]), editor Philomena Doheny [phone])  on 08/21/2018 3:29:02 PM   Radiology Dg Abd Acute W/chest  Result Date: 08/21/2018 CLINICAL DATA:  Nausea since yesterday. One episode of vomiting. General malaise. Constipation. EXAM: DG ABDOMEN ACUTE W/ 1V CHEST COMPARISON:  Chest x-ray of March 15, 2018 and abdominal and pelvic CT scan of June 23, 2018. FINDINGS: The lungs are hypoinflated. There is chronic elevation of the right hemidiaphragm. The left retrocardiac region remains somewhat dense. The cardiac silhouette is enlarged. The central pulmonary vascularity is prominent but stable. No cephalization of the vascular pattern is observed. The ICD is in stable position. Within the abdomen there is a loop of moderately distended gas-filled small bowel at the abdominopelvic junction. No free extraluminal gas collections are observed. The colonic stool burden is not increased. The decubitus radiographs reveal no free extraluminal gas collections. IMPRESSION: Findings compatible with a partial distal small bowel obstruction or ileus. No evidence of perforation. Hypoinflation of both lungs. Probable low-grade compensated CHF. No definite pneumonia. Electronically Signed   By: David  Martinique M.D.   On: 08/21/2018 13:33    Procedures Procedures (including critical care time)  Medications Ordered in ED Medications  sodium chloride 0.9 % bolus 1,000 mL (1,000 mLs Intravenous New Bag/Given 08/21/18 1544)     Initial Impression / Assessment and Plan / ED Course  I have reviewed the triage vital signs and the nursing notes.  Pertinent labs & imaging results that were available during my care of the  patient were reviewed by me and considered in my medical decision making (see chart for details).    Pt is feeling better. UA and CT abd/pelvis pending.  Pt signed out to Dr. Francia Greaves pending results.  Final Clinical Impressions(s) / ED Diagnoses   Final diagnoses:  Nausea    ED Discharge Orders    None       Isla Pence, MD 08/21/18 1601

## 2018-08-21 NOTE — ED Notes (Signed)
Pt aware urine sample is needed. Purewick placed

## 2018-08-21 NOTE — ED Notes (Signed)
ED TO INPATIENT HANDOFF REPORT  Name/Age/Gender [name] 84 y.o. female  Code Status Code Status History    Date Active Date Inactive Code Status Order ID Comments User Context   11/28/2017 1955 12/01/2017 2310 Full Code [id number]  Elmarie Shiley, MD Inpatient   10/28/2017 0658 11/02/2017 1611 Full Code [id number]  Radene Gunning, NP ED   04/22/2017 1630 04/29/2017 1718 Full Code [id number]  Jani Gravel, MD Inpatient   02/16/2017 1542 02/17/2017 1804 Full Code [id number]  Rosita Fire, MD ED   02/02/2016 1803 02/04/2016 2101 Full Code [id number]  Willia Craze, NP Inpatient   11/15/2015 0415 11/19/2015 2055 Full Code [id number]  Gennaro Africa, MD ED   10/01/2015 1526 10/05/2015 1825 Full Code [id number]  Dionne Milo, NP Inpatient   07/24/2015 0127 07/29/2015 1714 Full Code [id number]  Toy Baker, MD Inpatient   04/25/2015 2215 04/28/2015 1924 Full Code [id number]  Ivor Costa, MD Inpatient   03/22/2015 1059 03/24/2015 1905 Full Code [id number]  Leonie Man, MD Inpatient   03/18/2015 0624 03/22/2015 1059 Full Code [id number]  Lavina Hamman, MD Inpatient   03/01/2015 1628 03/04/2015 2143 Full Code [id number]  Karen Kitchens Inpatient      Home/SNF/Other Nursing Home  Chief Complaint Weakness  Level of Care/Admitting Diagnosis ED Disposition    ED Disposition Condition [facility] Area: [facility] [[id number]]  Level of Care: Med-Surg [16]  Diagnosis: SBO (small bowel obstruction) [facility]) [[id number]]  Admitting Physician: Alma Friendly [[id number]]  Attending Physician: Alma Friendly [[id number]]  Estimated length of stay: past midnight tomorrow  Certification:: I certify this patient will need inpatient services for at least 2 midnights  PT Class (Do Not Modify): Inpatient [101]  PT Acc Code (Do Not Modify): Private [1]       Medical History Past Medical History:  Diagnosis Date  . Anginal pain ([place])   . Arthritis     "comes and goes" (03/01/2015)  . Asthma   . CHF (congestive heart failure) ([place])   . Colon cancer ([place])   . Colostomy care ([place])   . COPD (chronic obstructive pulmonary disease) ([place])   . Diabetes mellitus without complication ([place])    pt denies this hx on 03/01/2015  . HCAP (healthcare-associated pneumonia) 04/22/2017  . Heart murmur   . History of blood transfusion 03/01/2015; 04/23/2017   "related to anemia,"  . Hyperlipidemia   . Hypertension   . Hypothyroidism   . Pneumonia    "just once" (03/01/2015)  . Pneumonia 04/23/2017  . Presence of combination internal cardiac defibrillator (ICD) and pacemaker    2005 in [facility]  . Pulmonary embolism ([place])   . Renal disorder     Allergies No Known Allergies  IV Location/Drains/Wounds Patient Lines/Drains/Airways Status   Active Line/Drains/Airways    Name:   Placement date:   Placement time:   Site:   Days:   Peripheral IV 08/21/18 Anterior;Distal;Left;Upper Arm   08/21/18    1510    Arm   less than 1   Colostomy LUQ   -    -    LUQ      Colostomy   -    -    -      External Urinary Catheter   11/29/17    0421    -   265          Labs/Imaging Results for orders placed or performed  during the hospital encounter of 08/21/18 (from the past 48 hour(s))  CBC with Differential     Status: Abnormal   Collection Time: 08/21/18  2:25 PM  Result Value Ref Range   WBC 9.5 4.0 - 10.5 K/uL   RBC 3.38 (L) 3.87 - 5.11 MIL/uL   Hemoglobin 9.2 (L) 12.0 - 15.0 g/dL   HCT 31.5 (L) 36.0 - 46.0 %   MCV 93.2 80.0 - 100.0 fL   MCH 27.2 26.0 - 34.0 pg   MCHC 29.2 (L) 30.0 - 36.0 g/dL   RDW 15.0 11.5 - 15.5 %   Platelets 307 150 - 400 K/uL   nRBC 0.0 0.0 - 0.2 %   Neutrophils Relative % 76 %   Neutro Abs 7.2 1.7 - 7.7 K/uL   Lymphocytes Relative 14 %   Lymphs Abs 1.4 0.7 - 4.0 K/uL   Monocytes Relative 9 %   Monocytes Absolute 0.9 0.1 - 1.0 K/uL   Eosinophils Relative 0 %   Eosinophils Absolute 0.0 0.0 - 0.5 K/uL   Basophils Relative 0 %    Basophils Absolute 0.0 0.0 - 0.1 K/uL   Immature Granulocytes 1 %   Abs Immature Granulocytes 0.05 0.00 - 0.07 K/uL    Comment: Performed at [facility], [place] [address], [place], [place] [address]  Comprehensive metabolic panel     Status: Abnormal   Collection Time: 08/21/18  2:25 PM  Result Value Ref Range   Sodium 141 135 - 145 mmol/L   Potassium 5.0 3.5 - 5.1 mmol/L   Chloride 101 98 - 111 mmol/L   CO2 32 22 - 32 mmol/L   Glucose, Bld 99 70 - 99 mg/dL   BUN 45 (H) 8 - 23 mg/dL   Creatinine, Ser 1.83 (H) 0.44 - 1.00 mg/dL   Calcium 9.7 8.9 - 10.3 mg/dL   Total Protein 8.6 (H) 6.5 - 8.1 g/dL   Albumin 3.6 3.5 - 5.0 g/dL   AST 19 15 - 41 U/L   ALT 24 0 - 44 U/L   Alkaline Phosphatase 81 38 - 126 U/L   Total Bilirubin 0.2 (L) 0.3 - 1.2 mg/dL   GFR calc non Af Amer 25 (L) >60 mL/min   GFR calc Af Amer 28 (L) >60 mL/min    Comment: (NOTE) The eGFR has been calculated using the CKD EPI equation. This calculation has not been validated in all clinical situations. eGFR's persistently <60 mL/min signify possible Chronic Kidney Disease.    Anion gap 8 5 - 15    Comment: Performed at [facility], [place] [address], [place], [place] [address]  Lipase, blood     Status: None   Collection Time: 08/21/18  2:25 PM  Result Value Ref Range   Lipase 24 11 - 51 U/L    Comment: Performed at [facility], [place] [address], [place], [place] [address]   Ct Abdomen Pelvis Wo Contrast  Result Date: 08/21/2018 CLINICAL DATA:  Bowel obstruction. Nausea. History of colon cancer. EXAM: CT ABDOMEN AND PELVIS WITHOUT CONTRAST TECHNIQUE: Multidetector CT imaging of the abdomen and pelvis was performed following the standard protocol without IV contrast. COMPARISON:  06/23/2018 FINDINGS: Lower chest: Motion artifact in the lung bases with chronic mosaic attenuation, possibly air trapping. Mild bibasilar subsegmental atelectasis. Unchanged 1 cm  calcified pleural nodule in the anterolateral right lung base. No pleural effusion. Cardiomegaly, coronary artery atherosclerosis, and pacemaker/ICD leads. Hepatobiliary: No focal liver abnormality is seen. Small  stones dependently in the gallbladder. No biliary dilatation. Pancreas: Unremarkable. Spleen: Unremarkable. Adrenals/Urinary Tract: Unremarkable adrenal glands. Unchanged punctate calcification in the inferior aspect of the right renal hilum, possibly vascular. Unchanged subcentimeter low-density lesion in the lower pole of the right kidney, too small to fully characterize. No hydronephrosis. Moderately distended bladder. Stomach/Bowel: Sequelae of partial colectomy are again identified with a left lower quadrant ostomy. There is colonic diverticulosis without evidence of diverticulitis. A parastomal hernia is again noted containing a loop of small bowel. Small bowel within the abdomen proximal to this is dilated and fluid-filled measuring up to 3.7 cm in diameter. Distal small bowel is collapsed. Vascular/Lymphatic: Abdominal aortic atherosclerosis without aneurysm. Unchanged subcentimeter short axis pelvic sidewall lymph nodes bilaterally. Reproductive: Status post hysterectomy. No adnexal masses. Other: No intraperitoneal free fluid. No pneumoperitoneum. Musculoskeletal: New L1 superior endplate compression fracture with 25% height loss. IMPRESSION: 1. Small bowel obstruction secondary to a parastomal hernia. 2. New L1 superior endplate compression fracture. 3.  Aortic Atherosclerosis (ICD10-I70.0). Electronically Signed   By: Logan Bores M.D.   On: 08/21/2018 16:43   Dg Abd Acute W/chest  Result Date: 08/21/2018 CLINICAL DATA:  Nausea since yesterday. One episode of vomiting. General malaise. Constipation. EXAM: DG ABDOMEN ACUTE W/ 1V CHEST COMPARISON:  Chest x-ray of March 15, 2018 and abdominal and pelvic CT scan of June 23, 2018. FINDINGS: The lungs are hypoinflated. There is chronic  elevation of the right hemidiaphragm. The left retrocardiac region remains somewhat dense. The cardiac silhouette is enlarged. The central pulmonary vascularity is prominent but stable. No cephalization of the vascular pattern is observed. The ICD is in stable position. Within the abdomen there is a loop of moderately distended gas-filled small bowel at the abdominopelvic junction. No free extraluminal gas collections are observed. The colonic stool burden is not increased. The decubitus radiographs reveal no free extraluminal gas collections. IMPRESSION: Findings compatible with a partial distal small bowel obstruction or ileus. No evidence of perforation. Hypoinflation of both lungs. Probable low-grade compensated CHF. No definite pneumonia. Electronically Signed   By: David  Martinique M.D.   On: 08/21/2018 13:33    Pending Labs Unresulted Labs (From admission, onward)    Start     Ordered   08/21/18 1219  Urinalysis, Routine w reflex microscopic  (ED Abdominal Pain)  ONCE - STAT,   STAT     08/21/18 1219   Signed and Held  Basic metabolic panel  Tomorrow morning,   R     Signed and Held   Signed and Held  CBC  Tomorrow morning,   R     Signed and Held          Vitals/Pain Today's Vitals   08/21/18 1700 08/21/18 1715 08/21/18 1730 08/21/18 1800  BP: (!) 147/63  (!) 141/61 (!) 142/66  Pulse: 99 99 100 (!) 101  Resp: _0 (!) 21  Temp:      TempSrc:      SpO2: 94% 100% 97% 93%  Weight:      Height:      PainSc:        Isolation Precautions No active isolations  Medications Medications  ondansetron (ZOFRAN) injection 4 mg (has no administration in time range)  morphine 2 MG/ML injection 2 mg (has no administration in time range)  diatrizoate meglumine-sodium (GASTROGRAFIN) 66-10 % solution 90 mL (has no administration in time range)  lactated ringers bolus 1,000 mL (has no administration in time range)  lip balm (CARMEX)  ointment 1 application (has no administration in time  range)  magic mouthwash (has no administration in time range)  guaiFENesin-dextromethorphan (ROBITUSSIN DM) 100-10 MG/5ML syrup 10 mL (has no administration in time range)  hydrocortisone (ANUSOL-HC) 2.5 % rectal cream 1 application (has no administration in time range)  alum & mag hydroxide-simeth (MAALOX/MYLANTA) 200-200-20 MG/5ML suspension 30 mL (has no administration in time range)  hydrocortisone cream 1 % 1 application (has no administration in time range)  menthol-cetylpyridinium (CEPACOL) lozenge 3 mg (has no administration in time range)  phenol (CHLORASEPTIC) mouth spray 1-2 spray (has no administration in time range)  albuterol (PROVENTIL) (2.5 MG/3ML) 0.083% nebulizer solution 2.5 mg (has no administration in time range)  REFRESH 1 % SOLN 1 drop (has no administration in time range)  mometasone-formoterol (DULERA) 200-5 MCG/ACT inhaler 2 puff (has no administration in time range)  pantoprazole (PROTONIX) injection 40 mg (has no administration in time range)  levothyroxine (SYNTHROID, LEVOTHROID) injection 25 mcg (has no administration in time range)  sodium chloride 0.9 % bolus 1,000 mL (1,000 mLs Intravenous New Bag/Given 08/21/18 1544)    Mobility walks with device

## 2018-08-21 NOTE — ED Notes (Signed)
RN attempted IV access without success, but did obtain bloodwork

## 2018-08-21 NOTE — ED Notes (Signed)
Blood-work obtained, but no IV access.  2 RN attempt.  IV team consult ordered.

## 2018-08-21 NOTE — ED Notes (Signed)
This RN attempted to call pt's son to update him per pt's request.

## 2018-08-21 NOTE — ED Triage Notes (Signed)
She c/o nausea since yesterday and a feeling of general unease and persistent nausea today. She states she had one emesis event last night. She is oriented x 4 with clear speech.

## 2018-08-21 NOTE — H&P (Signed)
History and Physical  [place] HUBERTY FXT:[id number] DOB: October 17, 1933 DOA: 08/21/2018  Referring physician: EDP PCP: [name], PA  Patient coming from: ALF & is able to ambulate with a walker  Chief Complaint: Nausea & vomiting  HPI: [name] is a 84 y.o. female with medical history significant for Colon CA status post colostomy, rectal CA, COPD/asthma, chronic systolic and diastolic HF, status post ICD, hypertension, hypothyroidism, hyperlipidemia, gout, presents to the ER from her assisted living facility complaining of nausea and vomiting x1 day.  Patient unable to keep any food down.  Denies any significant abdominal pain, fever/chills, shortness of breath, chest pain, diarrhea.  Patient noted slightly decreased output from colostomy.  Denies any other complaints.   ED Course: Vital signs, labs noted to be somewhat stable.  CT abdomen pelvis showed small bowel obstruction likely secondary to parastomal hernia.  General surgery consulted.  Patient admitted for further management  Review of Systems: Review of systems are otherwise negative   Past Medical History:  Diagnosis Date  . Anginal pain ([place])   . Arthritis    "comes and goes" (03/01/2015)  . Asthma   . CHF (congestive heart failure) ([place])   . Colon cancer ([place])   . Colostomy care ([place])   . COPD (chronic obstructive pulmonary disease) ([place])   . Diabetes mellitus without complication ([place])    pt denies this hx on 03/01/2015  . HCAP (healthcare-associated pneumonia) 04/22/2017  . Heart murmur   . History of blood transfusion 03/01/2015; 04/23/2017   "related to anemia,"  . Hyperlipidemia   . Hypertension   . Hypothyroidism   . Pneumonia    "just once" (03/01/2015)  . Pneumonia 04/23/2017  . Presence of combination internal cardiac defibrillator (ICD) and pacemaker    2005 in [facility]  . Pulmonary embolism ([place])   . Renal disorder    Past Surgical History:  Procedure Laterality Date  . ABDOMINAL HYSTERECTOMY    .  ABDOMINOPERINEAL PROCTOCOLECTOMY  2006   Rectal cancer  . ANKLE FRACTURE SURGERY Left   . CARDIAC CATHETERIZATION N/A 03/22/2015   Procedure: Right/Left Heart Cath and Coronary Angiography;  Surgeon: Leonie Man, MD;  Location: [facility];  Service: Cardiovascular;  Laterality: N/A;  . CATARACT EXTRACTION W/ INTRAOCULAR LENS  IMPLANT, BILATERAL Bilateral   . CHOLECYSTECTOMY    . DILATION AND CURETTAGE OF UTERUS    . FRACTURE SURGERY    . PARASTOMAL HERNIA REPAIR  2007   lysis of adhesions  . TUBAL LIGATION      Social History:  reports that she has never smoked. She has never used smokeless tobacco. She reports that she does not drink alcohol or use drugs.   No Known Allergies  Family History  Problem Relation Age of Onset  . CAD Unknown   . Hypertension Unknown   . Stroke Unknown   . Colon cancer Neg Hx   . GI Bleed Neg Hx       Prior to Admission medications   Medication Sig Start Date End Date Taking? Authorizing Provider  acetaminophen (TYLENOL) 500 MG tablet Take 1,000 mg by mouth 3 (three) times daily.    Yes [provider]  albuterol (PROVENTIL) (2.5 MG/3ML) 0.083% nebulizer solution Take 3 mLs (2.5 mg total) by nebulization every 4 (four) hours as needed for wheezing. 04/28/15  Yes Geradine Girt, DO  aspirin EC 81 MG tablet Take 1 tablet (81 mg total) by mouth every morning. 03/03/15  Yes  Rai, Ripudeep K, MD  atorvastatin (LIPITOR) 20 MG tablet Take 20 mg by mouth daily.    Yes [provider]  carboxymethylcellulose (REFRESH) 1 % ophthalmic solution Place 1 drop into both eyes 3 (three) times daily.   Yes [provider]  carvedilol (COREG) 6.25 MG tablet Take 6.25 mg by mouth 2 (two) times daily with a meal.   Yes [provider]  cyclobenzaprine (FLEXERIL) 5 MG tablet Take 5 mg by mouth 2 (two) times daily as needed for muscle spasms.   Yes [provider]  cyclobenzaprine (FLEXERIL) 5 MG tablet Take 5 mg by  mouth at bedtime.   Yes [provider]  Fluticasone-Salmeterol (ADVAIR) 500-50 MCG/DOSE AEPB Inhale 1 puff into the lungs 2 (two) times daily. Rinse mouth after use   Yes [provider]  furosemide (LASIX) 20 MG tablet Take 20 mg by mouth daily as needed (weight gain of more >3 lbs in 24 hours).    Yes [provider]  furosemide (LASIX) 40 MG tablet Take 1 tablet (40 mg total) by mouth daily. Patient taking differently: Take 40 mg by mouth 2 (two) times daily.  12/30/15  Yes Shirley Friar, PA-C  hydrALAZINE (APRESOLINE) 25 MG tablet Take 0.5 tablets (12.5 mg total) by mouth 3 (three) times daily. Patient taking differently: Take 25 mg by mouth 3 (three) times daily.  12/23/15  Yes Clegg, Amy D, NP  HYDROcodone-acetaminophen (NORCO/VICODIN) 5-325 MG tablet Take 1 tablet by mouth every 4 (four) hours as needed for moderate pain. 05/19/18  Yes Jola Schmidt, MD  isosorbide mononitrate (IMDUR) 30 MG 24 hr tablet Take 1 tablet (30 mg total) by mouth daily. 03/24/15  Yes Dhungel, Nishant, MD  levothyroxine (SYNTHROID, LEVOTHROID) 50 MCG tablet Take 50 mcg by mouth daily.  01/16/15  Yes [provider]  loratadine (CLARITIN) 10 MG tablet Take 10 mg by mouth daily.   Yes [provider]  meclizine (ANTIVERT) 25 MG tablet Take 1 tablet (25 mg total) by mouth 3 (three) times daily as needed for dizziness. 5/63/87  Yes Delora Fuel, MD  Menthol-Methyl Salicylate (ICY HOT) 56-43 % STCK Apply 1 application topically 2 (two) times daily as needed (knee pain.).   Yes [provider]  montelukast (SINGULAIR) 10 MG tablet Take 10 mg by mouth daily.    Yes [provider]  ondansetron (ZOFRAN) 4 MG tablet Take 1 tablet (4 mg total) by mouth every 6 (six) hours as needed for nausea or vomiting. 01/04/50  Yes Delora Fuel, MD  oxymetazoline (AFRIN) 0.05 % nasal spray Place 1 spray into both nostrils 2 (two) times daily as needed (for nose bleeds).  Reported on 12/23/2015   Yes [provider]  polyethylene glycol (MIRALAX / GLYCOLAX) packet Take 17 g by mouth every other day. Mix with 8 ounces of fluid and drink   Yes [provider]  Polysaccharide Iron Complex (FERREX 150 PO) Take 150 oz by mouth 2 (two) times daily.   Yes [provider]  potassium chloride SA (K-DUR,KLOR-CON) 20 MEQ tablet Take 20 mEq by mouth daily.    Yes [provider]  ranitidine (ZANTAC) 150 MG tablet Take 150 mg by mouth daily.   Yes [provider]  carvedilol (COREG) 3.125 MG tablet Take 2 tablets (6.25 mg total) by mouth 2 (two) times daily with a meal. Patient not taking: Reported on 08/21/2018 11/02/17   Mariel Aloe, MD  colchicine 0.6 MG tablet Take 1 tablet (  0.6 mg total) by mouth 2 (two) times daily. Patient not taking: Reported on 08/21/2018 05/19/18   Jola Schmidt, MD  diclofenac sodium (VOLTAREN) 1 % GEL Apply 4 g topically 4 (four) times daily. Patient not taking: Reported on 08/21/2018 06/23/18   Deno Etienne, DO    Physical Exam: BP (!) 141/61 (BP Location: Left Arm)   Pulse 99   Temp 98.4 F (36.9 C) (Oral)   Resp 18   Ht 5\' 6"  (1.676 m)   Wt 96.6 kg   SpO2 99%   BMI 34.38 kg/m   General: NAD Eyes: Normal ENT: Normal Neck: Supple Cardiovascular: S1, S2 present Respiratory: CTA B Abdomen: Soft, mild generalized tenderness, nondistended, hyperactive bowel sounds, colostomy in place Skin: Normal Musculoskeletal: No pedal edema bilaterally, noted chronic pain on the right hand and left ankle due to gout Psychiatric: Normal mood Neurologic: No focal neurologic deficit noted          Labs on Admission:  Basic Metabolic Panel: Recent Labs  Lab 08/21/18 1425  NA 141  K 5.0  CL 101  CO2 32  GLUCOSE 99  BUN 45*  CREATININE 1.83*  CALCIUM 9.7   Liver Function Tests: Recent Labs  Lab 08/21/18 1425  AST 19  ALT 24  ALKPHOS 81  BILITOT 0.2*  PROT 8.6*  ALBUMIN 3.6   Recent  Labs  Lab 08/21/18 1425  LIPASE 24   No results for input(s): AMMONIA in the last 168 hours. CBC: Recent Labs  Lab 08/21/18 1425  WBC 9.5  NEUTROABS 7.2  HGB 9.2*  HCT 31.5*  MCV 93.2  PLT 307   Cardiac Enzymes: No results for input(s): CKTOTAL, CKMB, CKMBINDEX, TROPONINI in the last 168 hours.  BNP (last 3 results) Recent Labs    11/28/17 1623 01/23/18 0302 03/15/18 1056  BNP 179.3* 215.0* 221.7*    ProBNP (last 3 results) No results for input(s): PROBNP in the last 8760 hours.  CBG: No results for input(s): GLUCAP in the last 168 hours.  Radiological Exams on Admission: Ct Abdomen Pelvis Wo Contrast  Result Date: 08/21/2018 CLINICAL DATA:  Bowel obstruction. Nausea. History of colon cancer. EXAM: CT ABDOMEN AND PELVIS WITHOUT CONTRAST TECHNIQUE: Multidetector CT imaging of the abdomen and pelvis was performed following the standard protocol without IV contrast. COMPARISON:  06/23/2018 FINDINGS: Lower chest: Motion artifact in the lung bases with chronic mosaic attenuation, possibly air trapping. Mild bibasilar subsegmental atelectasis. Unchanged 1 cm calcified pleural nodule in the anterolateral right lung base. No pleural effusion. Cardiomegaly, coronary artery atherosclerosis, and pacemaker/ICD leads. Hepatobiliary: No focal liver abnormality is seen. Small stones dependently in the gallbladder. No biliary dilatation. Pancreas: Unremarkable. Spleen: Unremarkable. Adrenals/Urinary Tract: Unremarkable adrenal glands. Unchanged punctate calcification in the inferior aspect of the right renal hilum, possibly vascular. Unchanged subcentimeter low-density lesion in the lower pole of the right kidney, too small to fully characterize. No hydronephrosis. Moderately distended bladder. Stomach/Bowel: Sequelae of partial colectomy are again identified with a left lower quadrant ostomy. There is colonic diverticulosis without evidence of diverticulitis. A parastomal hernia is again  noted containing a loop of small bowel. Small bowel within the abdomen proximal to this is dilated and fluid-filled measuring up to 3.7 cm in diameter. Distal small bowel is collapsed. Vascular/Lymphatic: Abdominal aortic atherosclerosis without aneurysm. Unchanged subcentimeter short axis pelvic sidewall lymph nodes bilaterally. Reproductive: Status post hysterectomy. No adnexal masses. Other: No intraperitoneal free fluid. No pneumoperitoneum. Musculoskeletal: New L1 superior endplate compression fracture with 25% height loss.  IMPRESSION: 1. Small bowel obstruction secondary to a parastomal hernia. 2. New L1 superior endplate compression fracture. 3.  Aortic Atherosclerosis (ICD10-I70.0). Electronically Signed   By: Logan Bores M.D.   On: 08/21/2018 16:43   Dg Abd Acute W/chest  Result Date: 08/21/2018 CLINICAL DATA:  Nausea since yesterday. One episode of vomiting. General malaise. Constipation. EXAM: DG ABDOMEN ACUTE W/ 1V CHEST COMPARISON:  Chest x-ray of March 15, 2018 and abdominal and pelvic CT scan of June 23, 2018. FINDINGS: The lungs are hypoinflated. There is chronic elevation of the right hemidiaphragm. The left retrocardiac region remains somewhat dense. The cardiac silhouette is enlarged. The central pulmonary vascularity is prominent but stable. No cephalization of the vascular pattern is observed. The ICD is in stable position. Within the abdomen there is a loop of moderately distended gas-filled small bowel at the abdominopelvic junction. No free extraluminal gas collections are observed. The colonic stool burden is not increased. The decubitus radiographs reveal no free extraluminal gas collections. IMPRESSION: Findings compatible with a partial distal small bowel obstruction or ileus. No evidence of perforation. Hypoinflation of both lungs. Probable low-grade compensated CHF. No definite pneumonia. Electronically Signed   By: David  Martinique M.D.   On: 08/21/2018 13:33    EKG:  Independently reviewed.  Sinus rhythm, paced rhythm  Assessment/Plan Present on Admission: . Rectal cancer ypTypN0 (0/13) s/p chemoXRT/ APR/colostomy 2006 . Dementia ([place]) . COPD (chronic obstructive pulmonary disease) ([place]) . CKD (chronic kidney disease) stage 3, GFR 30-59 ml/min (H[facility]) . Pacemaker  Principal Problem:   SBO (small bowel obstruction) ([facility]) Active Problems:   Colostomy in place [facility])   COPD (chronic obstructive pulmonary disease) ([facility])   Dementia (H[facility])   CKD (chronic kidney disease) stage 3, GFR 30-59 ml/min (HCC)   Rectal cancer ypTypN0 (0/13) s/p chemoXRT/ APR/colostomy 2006   Pacemaker   Recurrent parastomal hernia at [facility] colostomy   SBO Currently afebrile, no leukocytosis CT abdomen pelvis showed small bowel obstruction likely secondary to parastomal hernia General surgery consulted-SBO protocol N.p.o., NG tube, gentle IV fluids as patient has history of CHF IV antiemetics Monitor closely  CKD stage III Creatinine baseline around 1.7-2, currently at baseline Avoid nephrotoxic's Daily BMP  HTN Held home coreg, hydralazine, imdur PRN IV hydralazine as pt is npo  Anemia of CKD Hemoglobin at baseline Held iron supplementation Daily CBC  Chronic systolic and diastolic HF s/p AICD Appears compensated Last echo on 04/2017 showed EF of 40 to 45%, moderate diffuse hypokinesis, PA peak pressure of 65 mmHg Hold home Lasix for now, hold imdur, Coreg as pt is npo  COPD/asthma Stable Chest x-ray showed hypoinflation of both lungs Continue albuterol, Dulera inhaler  Hyperlipidemia Hold home statin  Hypothyroidism Switch to IV Synthroid  GERD Hold home Zantac IV pantoprazole  Gout No longer on colchicine Held home Flexeril    DVT prophylaxis: Heparin  Code Status: Full  Family Communication: None at bedside  Disposition Plan: Once patient significantly improves, work-up complete  Consults called: General surgery  Admission status:  Inpatient    Alma Friendly MD [facility]   If 7PM-7AM, please contact night-coverage [email or web address] 08/21/2018, 6:19 PM

## 2018-08-21 NOTE — Consult Note (Signed)
[name] [name] [name] [name]  July 14, 1934 [id number]  CARE TEAM:  PCP: Shelby Hausen, PA  Outpatient Care Team: Patient Care Team: [name] [name], [place] as PCP - General (Internal Medicine)  Inpatient Treatment Team: Treatment Team: Attending Provider: Valarie Merino, MD; Registered Nurse: Shelby Low, RN; Technician: [name] [name], EMT   This patient is a 84 [name] who presents today for surgical evaluation at the request of Dr [name] [name].   Chief complaint / Reason for evaluation: Small bowel obstruction.  Pleasant elderly woman.  History of rectal cancer requiring abdominal perineal resection with permanent colostomy in January 2006 by Dr. Lisabeth [name] of our group.  Had neoadjuvant chemoradiation therapy I believe that was down staged to a T2N0 cancer.  No evidence of cancer recurrence.  She developed a bowel obstruction in 2007.  Required medical admission and stabilization.  Ultimately required operative lysis adhesions and parastomal hernia repair August 2007.  Dr. Dalbert [name] as well.  History of bowel obstruction at that time requiring lysis adhesions and repair.  I do not think surgery has been involved since that time.  She does have prior CT scan showing a loop of small bowel going up around what looks like tacks of mesh into the colostomy consistent with her current paracolostomy hernia.  No evidence of bowel obstructions in the past decade the patient can recall and I do not have any records in our system although she has been at [facility] at times.  Currently she does have some dementia but seems oriented tonight.    Patient has a 2-day history of nausea.   Denies real abdominal pain but just some crampiness at times.  She had emesis.  Concerning.  Brought from her skilled nursing facility to the emergency room.  Not in shock.  CT scan shows probable small bowel obstruction with transition zone near colostomy.  Surgical consultation requested.  Patient does have a past medical history  of COPD, some dementia.  Hypothyroidism, ICD placement.  She can walk with a walker with significant help.  Tends to stay in bed.   Assessment  [name] [name]  84 y.o. female       Problem List:  Principal Problem:   SBO (small bowel obstruction) (HCC) Active Problems:   Dementia ([place])   Rectal cancer ypTypN0 (0/13) s/p chemoXRT/ APR/colostomy 2006   Colostomy in place [facility])   COPD (chronic obstructive pulmonary disease) (HCC)   CKD (chronic kidney disease) stage 3, GFR 30-59 ml/min (HCC)   Pacemaker   Recurrent parastomal hernia at LLQ colostomy   Small bowel obstruction with transition zone near colostomy that is reducible.  Plan:  Medical admission and stabilization.  Nasogastric tube.  Small bowel protocol.  Serial exams.  See if this will open nonoperatively since the recurrent parastomal hernia at the colostomy is feels reducible.  If not improve or worsen, may require operative exploration and repair.  If she needs surgery, would want medical and possibly cardiac clearance as well.  She has a history of some heart failure with decreased ejection fraction 35% as of 2 years ago.  VTE prophylaxis- SCDs, etc  Mobilize as tolerated to help recovery  Get copy of operative records from 2005/2006.  45 minutes spent in review, evaluation, examination, counseling, and coordination of care.  More than 50% of that time was spent in counseling.  Shelby Hector, MD, FACS, MASCRS Gastrointestinal and Minimally Invasive Surgery    [address], [place], [place]  [address] [phone] Main / Paging [phone] Fax   08/21/2018      Past Medical History:  Diagnosis Date  . Anginal pain ([place])   . Arthritis    "comes and goes" ([name]/23/2016)  . Asthma   . CHF (congestive heart failure) ([place])   . Colon cancer ([place])   . Colostomy care ([place])   . COPD (chronic obstructive pulmonary disease) ([place])   . Diabetes mellitus without complication ([place])      pt denies this hx on [name]/23/2016  . Heart murmur   . History of blood transfusion [name]/23/2016; 04/23/2017   "related to anemia,"  . Hyperlipidemia   . Hypertension   . Hypothyroidism   . Pneumonia    "just once" ([name]/23/2016)  . Pneumonia 04/23/2017  . Presence of combination internal cardiac defibrillator (ICD) and pacemaker    2005 in [facility]  . Pulmonary embolism ([place])   . Renal disorder     Past Surgical History:  Procedure Laterality Date  . ABDOMINAL HYSTERECTOMY    . ABDOMINOPERINEAL PROCTOCOLECTOMY  2006   Rectal cancer  . ANKLE FRACTURE SURGERY Left   . CARDIAC CATHETERIZATION N/A 03/22/2015   Procedure: Right/Left Heart Cath and Coronary Angiography;  Surgeon: [name] Man, MD;  Location: [facility];  Service: Cardiovascular;  Laterality: N/A;  . CATARACT EXTRACTION W/ INTRAOCULAR LENS  IMPLANT, BILATERAL Bilateral   . CHOLECYSTECTOMY    . DILATION AND CURETTAGE OF UTERUS    . FRACTURE SURGERY    . PARASTOMAL HERNIA REPAIR  2007   lysis of adhesions  . TUBAL LIGATION      Social History   Socioeconomic History  . Marital status: Divorced    Spouse name: Not on file  . Number of children: Not on file  . Years of education: Not on file  . Highest education level: Not on file  Occupational History  . Occupation: Retired Psychologist, counselling  Social Needs  . Financial resource strain: Not on file  . Food insecurity:    Worry: Not on file    Inability: Not on file  . Transportation needs:    Medical: Not on file    Non-medical: Not on file  Tobacco Use  . Smoking status: Never Smoker  . Smokeless tobacco: Never Used  Substance and Sexual Activity  . Alcohol use: No  . Drug use: No  . Sexual activity: Never  Lifestyle  . Physical activity:    Days per week: Not on file    Minutes per session: Not on file  . Stress: Not on file  Relationships  . Social connections:    Talks on phone: Not on file    Gets together: Not on file    Attends religious  service: Not on file    Active member of club or organization: Not on file    Attends meetings of clubs or organizations: Not on file    Relationship status: Not on file  . Intimate partner violence:    Fear of current or ex partner: Not on file    Emotionally abused: Not on file    Physically abused: Not on file    Forced sexual activity: Not on file  Other Topics Concern  . Not on file  Social History Narrative  . Not on file    Family History  Problem Relation Age of Onset  . CAD Unknown   . Hypertension Unknown   . Stroke Unknown   . Colon cancer  Neg Hx   . GI Bleed Neg Hx     Current Facility-Administered Medications  Medication Dose Route Frequency Provider Last Rate Last Dose  . alum & mag hydroxide-simeth (MAALOX/MYLANTA) 200-200-20 MG/5ML suspension 30 mL  30 mL Oral Q6H PRN Michael Boston, MD      . diatrizoate meglumine-sodium (GASTROGRAFIN) 66-10 % solution 90 mL  90 mL Per NG tube Once Michael Boston, MD      . guaiFENesin-dextromethorphan (ROBITUSSIN DM) 100-10 MG/5ML syrup 10 mL  10 mL Oral Q4H PRN Michael Boston, MD      . hydrocortisone (ANUSOL-HC) 2.[name] % rectal cream 1 application  1 application Topical QID PRN Michael Boston, MD      . hydrocortisone cream 1 % 1 application  1 application Topical TID PRN Michael Boston, MD      . lactated ringers bolus 1,000 mL  1,000 mL Intravenous Once Michael Boston, MD      . lip balm (CARMEX) ointment 1 application  1 application Topical BID Michael Boston, MD      . magic mouthwash  15 mL Oral QID PRN Michael Boston, MD      . menthol-cetylpyridinium (CEPACOL) lozenge 3 mg  1 lozenge Oral PRN Michael Boston, MD      . morphine 2 MG/ML injection 2 mg  2 mg Intravenous Once [name] Merino, MD      . ondansetron [facility]) injection 4 mg  4 mg Intravenous Once [name] Merino, MD      . phenol (CHLORASEPTIC) mouth spray 1-2 spray  1-2 spray Mouth/Throat PRN Michael Boston, MD       Current Outpatient Medications  Medication Sig  Dispense Refill  . acetaminophen (TYLENOL) 500 MG tablet Take 1,000 mg by mouth 3 (three) times daily.     Marland Kitchen albuterol (PROVENTIL) (2.[name] MG/3ML) 0.083% nebulizer solution Take 3 mLs (2.[name] mg total) by nebulization every 4 (four) hours as needed for wheezing. 75 mL 12  . aspirin EC 81 MG tablet Take 1 tablet (81 mg total) by mouth every morning.    Marland Kitchen atorvastatin (LIPITOR) 20 MG tablet Take 20 mg by mouth daily.     . carboxymethylcellulose (REFRESH) 1 % ophthalmic solution Place 1 drop into both eyes 3 (three) times daily.    . carvedilol (COREG) 6.25 MG tablet Take 6.25 mg by mouth 2 (two) times daily with a meal.    . cyclobenzaprine (FLEXERIL) [name] MG tablet Take [name] mg by mouth 2 (two) times daily as needed for muscle spasms.    . cyclobenzaprine (FLEXERIL) [name] MG tablet Take [name] mg by mouth at bedtime.    . Fluticasone-Salmeterol (ADVAIR) 500-50 MCG/DOSE AEPB Inhale 1 puff into the lungs 2 (two) times daily. Rinse mouth after use    . furosemide (LASIX) 20 MG tablet Take 20 mg by mouth daily as needed (weight gain of more >3 lbs in 24 hours).     . furosemide (LASIX) 40 MG tablet Take 1 tablet (40 mg total) by mouth daily. (Patient taking differently: Take 40 mg by mouth 2 (two) times daily. ) 30 tablet   . hydrALAZINE (APRESOLINE) 25 MG tablet Take 0.[name] tablets (12.[name] mg total) by mouth 3 (three) times daily. (Patient taking differently: Take 25 mg by mouth 3 (three) times daily. )    . HYDROcodone-acetaminophen (NORCO/VICODIN) [name]-325 MG tablet Take 1 tablet by mouth every 4 (four) hours as needed for moderate pain. 15 tablet 0  . isosorbide mononitrate (IMDUR) 30 MG 24 hr  tablet Take 1 tablet (30 mg total) by mouth daily. 30 tablet 0  . levothyroxine (SYNTHROID, LEVOTHROID) 50 MCG tablet Take 50 mcg by mouth daily.   2  . loratadine (CLARITIN) 10 MG tablet Take 10 mg by mouth daily.    . meclizine (ANTIVERT) 25 MG tablet Take 1 tablet (25 mg total) by mouth 3 (three) times daily as needed for dizziness. 30  tablet 0  . Menthol-Methyl Salicylate (ICY HOT) 19-62 % STCK Apply 1 application topically 2 (two) times daily as needed (knee pain.).    Marland Kitchen montelukast (SINGULAIR) 10 MG tablet Take 10 mg by mouth daily.     . ondansetron (ZOFRAN) 4 MG tablet Take 1 tablet (4 mg total) by mouth every 6 (six) hours as needed for nausea or vomiting. 12 tablet 0  . oxymetazoline (AFRIN) 0.05 % nasal spray Place 1 spray into both nostrils 2 (two) times daily as needed (for nose bleeds). Reported on 12/23/2015    . polyethylene glycol (MIRALAX / GLYCOLAX) packet Take 17 g by mouth every other day. Mix with 8 ounces of fluid and drink    . Polysaccharide Iron Complex (FERREX 150 PO) Take 150 oz by mouth 2 (two) times daily.    . potassium chloride SA (K-DUR,KLOR-CON) 20 MEQ tablet Take 20 mEq by mouth daily.     . ranitidine (ZANTAC) 150 MG tablet Take 150 mg by mouth daily.    . carvedilol (COREG) 3.125 MG tablet Take 2 tablets (6.25 mg total) by mouth 2 (two) times daily with a meal. (Patient not taking: Reported on 08/21/2018) 60 tablet 0  . colchicine 0.6 MG tablet Take 1 tablet (0.6 mg total) by mouth 2 (two) times daily. (Patient not taking: Reported on 08/21/2018) 10 tablet 0  . diclofenac sodium (VOLTAREN) 1 % GEL Apply 4 g topically 4 (four) times daily. (Patient not taking: Reported on 08/21/2018) 100 g 0     No Known Allergies  ROS:   All other systems reviewed & are negative except per HPI or as noted below: Constitutional:  No fevers, chills, sweats.  Weight stable Eyes:  No vision changes, No discharge HENT:  No sore throats, nasal drainage Lymph: No neck swelling, No bruising easily Pulmonary:  No cough, productive sputum CV: No orthopnea, PND  Patient walks with a walker for balance slowly.  No exertional chest/neck/shoulder/arm pain. GI:  No personal nor family history of inflammatory bowel disease, irritable bowel syndrome, allergy such as Celiac Sprue, dietary/dairy problems, colitis, ulcers nor  gastritis.  No recent sick contacts/gastroenteritis.  No travel outside the country.  No changes in diet.  Rectal cancer survivor status post abdominal perineal resection 2006 Renal: No UTIs, No hematuria Genital:  No drainage, bleeding, masses Musculoskeletal: No severe joint pain.  Good ROM major joints Skin:  No sores or lesions.  No rashes Heme/Lymph:  No easy bleeding.  No swollen lymph nodes Neuro: No focal weakness/numbness.  No seizures Psych: No suicidal ideation.  No hallucinations.  Patient does have some long-term memory recall but has a history of dementia  BP (!) 149/63 (BP Location: Right Arm)   Pulse 98   Temp 98.4 F (36.9 C) (Oral)   Resp 14   Ht '[name]\' 6"'  (1.676 m)   Wt 96.6 kg   SpO2 95%   BMI 34.38 kg/m   Physical Exam: General: Pt awake/alert/oriented x4 in mild major acute distress Eyes: PERRL, normal EOM. Sclera nonicteric Neuro: CN II-XII intact w/o focal sensory/motor deficits. Lymph:  No head/neck/groin lymphadenopathy Psych:  No delerium/psychosis/paranoia HENT: Normocephalic, Mucus membranes moist.  No thrush Neck: Supple, No tracheal deviation Chest: No pain.  Good respiratory excursion. CV:  Pulses intact.  Regular rhythm  Abdomen: Soft, Nondistended.  Nontender.  No incarcerated hernias.  Left lower quadrant colostomy in place with thick pasty stool in bag.  I can feel some fullness lateral to the colostomy that reduces down with boborygami/bowel sounds consistent with a loop of small bowel in the peristomal colostomy hernia.  Consistent with reducible parastomal hernia.  Gen:  No inguinal hernias.  No inguinal lymphadenopathy.   Rectal, deferred since she has no rectum after abdominoperineal resection. Ext:  SCDs BLE.  No significant edema.  No cyanosis Skin: No petechiae / purpurea.  No major sores Musculoskeletal: No severe joint pain.  Good ROM major joints   Results:   Labs: Results for orders placed or performed during the hospital encounter  of 08/21/18 (from the past 48 hour(s))  CBC with Differential     Status: Abnormal   Collection Time: 08/21/18  2:25 PM  Result Value Ref Range   WBC 9.[name] 4.0 - 10.[name] K/uL   RBC 3.38 (L) 3.87 - [name].11 MIL/uL   Hemoglobin 9.2 (L) 12.0 - 15.0 g/dL   HCT 31.[name] (L) 36.0 - 46.0 %   MCV 93.2 80.0 - 100.0 fL   MCH 27.2 26.0 - 34.0 pg   MCHC 29.2 (L) 30.0 - 36.0 g/dL   RDW 15.0 11.[name] - 15.[name] %   Platelets 307 150 - 400 K/uL   nRBC 0.0 0.0 - 0.2 %   Neutrophils Relative % 76 %   Neutro Abs 7.2 1.7 - 7.7 K/uL   Lymphocytes Relative 14 %   Lymphs Abs 1.4 0.7 - 4.0 K/uL   Monocytes Relative 9 %   Monocytes Absolute 0.9 0.1 - 1.0 K/uL   Eosinophils Relative 0 %   Eosinophils Absolute 0.0 0.0 - 0.[name] K/uL   Basophils Relative 0 %   Basophils Absolute 0.0 0.0 - 0.1 K/uL   Immature Granulocytes 1 %   Abs Immature Granulocytes 0.05 0.00 - 0.07 K/uL    Comment: Performed at [facility], [place] [address], [place], [place] [address]  Comprehensive metabolic panel     Status: Abnormal   Collection Time: 08/21/18  2:25 PM  Result Value Ref Range   Sodium 141 135 - 145 mmol/L   Potassium [name].0 3.[name] - [name].1 mmol/L   Chloride 101 98 - 111 mmol/L   CO2 32 22 - 32 mmol/L   Glucose, Bld 99 70 - 99 mg/dL   BUN 45 (H) 8 - 23 mg/dL   Creatinine, Ser 1.83 (H) 0.44 - 1.00 mg/dL   Calcium 9.7 8.9 - 10.3 mg/dL   Total Protein 8.6 (H) 6.[name] - 8.1 g/dL   Albumin 3.6 3.[name] - [name].0 g/dL   AST 19 15 - 41 U/L   ALT 24 0 - 44 U/L   Alkaline Phosphatase 81 38 - 126 U/L   Total Bilirubin 0.2 (L) 0.3 - 1.2 mg/dL   GFR calc non Af Amer 25 (L) >60 mL/min   GFR calc Af Amer 28 (L) >60 mL/min    Comment: (NOTE) The eGFR has been calculated using the CKD EPI equation. This calculation has not been validated in all clinical situations. eGFR's persistently <60 mL/min signify possible Chronic Kidney Disease.    Anion gap 8 [name] - 15    Comment: Performed at [facility], [place] [address]  [place], [place] [address]  Lipase, blood     Status: None   Collection Time: 08/21/18  2:25 PM  Result Value Ref Range   Lipase 24 11 - 51 U/L    Comment: Performed at [facility], [place] [address] [place], [place] [address]    Imaging / Studies: Ct Abdomen Pelvis Wo Contrast  Result Date: 08/21/2018 CLINICAL DATA:  Bowel obstruction. Nausea. History of colon cancer. EXAM: CT ABDOMEN AND PELVIS WITHOUT CONTRAST TECHNIQUE: Multidetector CT imaging of the abdomen and pelvis was performed following the standard protocol without IV contrast. COMPARISON:  06/23/2018 FINDINGS: Lower chest: Motion artifact in the lung bases with chronic mosaic attenuation, possibly air trapping. Mild bibasilar subsegmental atelectasis. Unchanged 1 cm calcified pleural nodule in the anterolateral right lung base. No pleural effusion. Cardiomegaly, coronary artery atherosclerosis, and pacemaker/ICD leads. Hepatobiliary: No focal liver abnormality is seen. Small stones dependently in the gallbladder. No biliary dilatation. Pancreas: Unremarkable. Spleen: Unremarkable. Adrenals/Urinary Tract: Unremarkable adrenal glands. Unchanged punctate calcification in the inferior aspect of the right renal hilum, possibly vascular. Unchanged subcentimeter [name]-density lesion in the lower pole of the right kidney, too small to fully characterize. No hydronephrosis. Moderately distended bladder. Stomach/Bowel: Sequelae of partial colectomy are again identified with a left lower quadrant ostomy. There is colonic diverticulosis without evidence of diverticulitis. A parastomal hernia is again noted containing a loop of small bowel. Small bowel within the abdomen proximal to this is dilated and fluid-filled measuring up to 3.7 cm in diameter. Distal small bowel is collapsed. Vascular/Lymphatic: Abdominal aortic atherosclerosis without aneurysm. Unchanged subcentimeter short axis pelvic sidewall lymph nodes bilaterally. Reproductive:  Status post hysterectomy. No adnexal masses. Other: No intraperitoneal free fluid. No pneumoperitoneum. Musculoskeletal: New L1 superior endplate compression fracture with 25% height loss. IMPRESSION: 1. Small bowel obstruction secondary to a parastomal hernia. 2. New L1 superior endplate compression fracture. 3.  Aortic Atherosclerosis (ICD10-I70.0). Electronically Signed   By: Logan Bores M.D.   On: 08/21/2018 16:43   Dg Abd Acute W/chest  Result Date: 08/21/2018 CLINICAL DATA:  Nausea since yesterday. One episode of vomiting. General malaise. Constipation. EXAM: DG ABDOMEN ACUTE W/ 1V CHEST COMPARISON:  Chest x-ray of March 15, 2018 and abdominal and pelvic CT scan of June 23, 2018. FINDINGS: The lungs are hypoinflated. There is chronic elevation of the right hemidiaphragm. The left retrocardiac region remains somewhat dense. The cardiac silhouette is enlarged. The central pulmonary vascularity is prominent but stable. No cephalization of the vascular pattern is observed. The ICD is in stable position. Within the abdomen there is a loop of moderately distended gas-filled small bowel at the abdominopelvic junction. No free extraluminal gas collections are observed. The colonic stool burden is not increased. The decubitus radiographs reveal no free extraluminal gas collections. IMPRESSION: Findings compatible with a partial distal small bowel obstruction or ileus. No evidence of perforation. Hypoinflation of both lungs. Probable [name]-grade compensated CHF. No definite pneumonia. Electronically Signed   By: David  Martinique M.D.   On: 08/21/2018 13:33    Medications / Allergies: per chart  Antibiotics: Anti-infectives (From admission, onward)   None        Note: Portions of this report may have been transcribed using voice recognition software. Every effort was made to ensure accuracy; however, inadvertent computerized transcription errors may be present.   Any transcriptional errors that result  from this process are unintentional.    Shelby Hector, MD, FACS, MASCRS Gastrointestinal and Minimally  Invasive Surgery    [address] [place], [place] [address] [phone] Main / Paging [phone] Fax   08/21/2018

## 2018-08-21 NOTE — ED Provider Notes (Signed)
Patient seen after sign out.  CT imaging suggest small bowel obstruction.  Dr. Johney Maine of surgery is aware of case and will evaluate patient as consult.  Hospital service is aware of case and will evaluate for admission.  Patient appears to be comfortable at time of my reevaluation.  She understands the plan of care.  She understands the need for admission.   Valarie Merino, MD 08/21/18 1740

## 2018-08-21 NOTE — Progress Notes (Signed)
MD cancelled order for cardiac monitoring tonight.  Pt is here for SBO.  At 10 pm Gastrografin was administered via NGT.  Radiology was informed of administering time and  NGT was clamped also for one hour.  It will be unclamped at 2300.

## 2018-08-22 ENCOUNTER — Inpatient Hospital Stay (HOSPITAL_COMMUNITY): Payer: Medicare HMO

## 2018-08-22 ENCOUNTER — Other Ambulatory Visit: Payer: Self-pay

## 2018-08-22 LAB — CBC
HCT: 32.2 % — ABNORMAL LOW (ref 36.0–46.0)
HEMOGLOBIN: 9.3 g/dL — AB (ref 12.0–15.0)
MCH: 26.9 pg (ref 26.0–34.0)
MCHC: 28.9 g/dL — ABNORMAL LOW (ref 30.0–36.0)
MCV: 93.1 fL (ref 80.0–100.0)
NRBC: 0 % (ref 0.0–0.2)
PLATELETS: 307 10*3/uL (ref 150–400)
RBC: 3.46 MIL/uL — AB (ref 3.87–5.11)
RDW: 14.9 % (ref 11.5–15.5)
WBC: 7.3 10*3/uL (ref 4.0–10.5)

## 2018-08-22 LAB — BASIC METABOLIC PANEL
ANION GAP: 12 (ref 5–15)
BUN: 42 mg/dL — ABNORMAL HIGH (ref 8–23)
CALCIUM: 9.3 mg/dL (ref 8.9–10.3)
CO2: 26 mmol/L (ref 22–32)
Chloride: 104 mmol/L (ref 98–111)
Creatinine, Ser: 1.83 mg/dL — ABNORMAL HIGH (ref 0.44–1.00)
GFR, EST AFRICAN AMERICAN: 28 mL/min — AB (ref >60)
GFR, EST NON AFRICAN AMERICAN: 25 mL/min — AB (ref >60)
Glucose, Bld: 94 mg/dL (ref 70–99)
Potassium: 4.6 mmol/L (ref 3.5–5.1)
SODIUM: 142 mmol/L (ref 135–145)

## 2018-08-22 LAB — MRSA PCR SCREENING: MRSA BY PCR: NEGATIVE

## 2018-08-22 NOTE — Consult Note (Addendum)
WOC assistance requested for ostomy products. Pt states she has been independent with pouch application and emptying  since surgery was performed in 2006 and denies any problems; she does not have any supplies with her at the hospital. She uses a [employer] 2 piece pouching system.  Large amt formed brown stool is filling the current pouch, removed and applied new 2 piece system.  Stoma is red and viable, flush with skin level, 1 1/4 inches. She denies need for further assistance or questions.  5 sets of Extra supplies ordered to room for patient or staff nurse use. Please re-consult if further assistance is needed.  Thank-you,  Julien Girt MSN, [place], [place], [place], [place]

## 2018-08-22 NOTE — Progress Notes (Signed)
Stool is formed and pasty difficulty to assess actual stoma due to the stool.

## 2018-08-22 NOTE — Progress Notes (Signed)
PROGRESS NOTE  [name]:[id number] DOB: April 01, 1934 DOA: 08/21/2018 PCP: [name], PA  HPI/Recap of past 24 hours: [name] is a 84 y.o. female with medical history significant for rectal CA status post colostomy, colon CA, COPD/asthma, chronic systolic and diastolic HF, status post ICD, hypertension, hypothyroidism, hyperlipidemia, gout, presents to the ER from her assisted living facility complaining of nausea and vomiting x1 day.  Patient unable to keep any food down.  Denies any significant abdominal pain, fever/chills, shortness of breath, chest pain, diarrhea.  Patient noted slightly decreased output from colostomy.  Denies any other complaints. In the ED, vital signs, labs noted to be somewhat stable.  CT abdomen pelvis showed small bowel obstruction likely secondary to parastomal hernia.  General surgery consulted.  Patient admitted for further management.  Today, pt reported feeling somewhat better, still slightly tender to touch on palpation. Denies any further nausea/vomiting, chest pain, SOB, fever/chills  Assessment/Plan: Principal Problem:   SBO (small bowel obstruction) (HCC) Active Problems:   Colostomy in place [facility])   COPD (chronic obstructive pulmonary disease) (HCC)   Dementia (HCC)   CKD (chronic kidney disease) stage 3, GFR 30-59 ml/min (HCC)   Rectal cancer ypTypN0 (0/13) s/p chemoXRT/ APR/colostomy 2006   Pacemaker   Recurrent parastomal hernia at LLQ colostomy  SBO Currently afebrile, no leukocytosis CT abdomen pelvis showed small bowel obstruction likely secondary to parastomal hernia General surgery consulted: SBO protocol N.p.o., NG tube, gentle IV fluids as patient has history of CHF IV antiemetics Monitor closely  CKD stage III Creatinine baseline around 1.7-2, currently at baseline Avoid nephrotoxic's Daily BMP  HTN Held home coreg, hydralazine, imdur PRN IV hydralazine as pt is npo  Anemia of CKD Hemoglobin at baseline Held  iron supplementation Daily CBC  Chronic systolic and diastolic HF s/p AICD Appears compensated Last echo on 04/2017 showed EF of 40 to 45%, moderate diffuse hypokinesis, PA peak pressure of 65 mmHg Hold home Lasix for now, hold imdur, Coreg as pt is npo  COPD/asthma Stable Chest x-ray showed hypoinflation of both lungs Continue albuterol, Dulera inhaler  Hyperlipidemia Hold home statin  Hypothyroidism Switch to IV Synthroid  GERD Hold home Zantac IV pantoprazole  Gout No longer on colchicine Held home Flexeril, no c/o of pain    Code Status: Full  Family Communication: None at bedside  Disposition Plan: Once stable, back to ALF   Consultants:  General surgery  Procedures:  None  Antimicrobials:  None  DVT prophylaxis: Heparin   Objective: Vitals:   08/21/18 1800 08/21/18 2214 08/22/18 0359 08/22/18 0854  BP: (!) 142/66 (!) 140/59 (!) 155/62 (!) 159/70  Pulse: (!) 101 95 95 (!) 101  Resp: (!) 21 20 20 18   Temp:  98.8 F (37.1 C) 98.2 F (36.8 C) 98 F (36.7 C)  TempSrc:  Oral Oral Oral  SpO2: 93% 97% 99% 93%  Weight:      Height:        Intake/Output Summary (Last 24 hours) at 08/22/2018 1124 Last data filed at 08/22/2018 0912 Gross per 24 hour  Intake 451.98 ml  Output 915 ml  Net -463.02 ml   Filed Weights   08/21/18 1219  Weight: 96.6 kg    Exam:   General: NAD  Cardiovascular: S1, S2 present  Respiratory: CTAB  Abdomen: Soft, mild TTP, mildly distended, hyperactive BS, colostomy present.  NG with about 500 cc bilious fluid in canister  Musculoskeletal: No pedal edema bilaterally  Skin: Normal  Psychiatry: Normal mood   Data Reviewed: CBC: Recent Labs  Lab 08/21/18 1425 08/22/18 0350  WBC 9.5 7.3  NEUTROABS 7.2  --   HGB 9.2* 9.3*  HCT 31.5* 32.2*  MCV 93.2 93.1  PLT 307 440   Basic Metabolic Panel: Recent Labs  Lab 08/21/18 1425 08/22/18 0350  NA 141 142  K 5.0 4.6  CL 101 104  CO2 32 26    GLUCOSE 99 94  BUN 45* 42*  CREATININE 1.83* 1.83*  CALCIUM 9.7 9.3   GFR: Estimated Creatinine Clearance: 27.8 mL/min (A) (by C-G formula based on SCr of 1.83 mg/dL (H)). Liver Function Tests: Recent Labs  Lab 08/21/18 1425  AST 19  ALT 24  ALKPHOS 81  BILITOT 0.2*  PROT 8.6*  ALBUMIN 3.6   Recent Labs  Lab 08/21/18 1425  LIPASE 24   No results for input(s): AMMONIA in the last 168 hours. Coagulation Profile: No results for input(s): INR, PROTIME in the last 168 hours. Cardiac Enzymes: No results for input(s): CKTOTAL, CKMB, CKMBINDEX, TROPONINI in the last 168 hours. BNP (last 3 results) No results for input(s): PROBNP in the last 8760 hours. HbA1C: No results for input(s): HGBA1C in the last 72 hours. CBG: No results for input(s): GLUCAP in the last 168 hours. Lipid Profile: No results for input(s): CHOL, HDL, LDLCALC, TRIG, CHOLHDL, LDLDIRECT in the last 72 hours. Thyroid Function Tests: No results for input(s): TSH, T4TOTAL, FREET4, T3FREE, THYROIDAB in the last 72 hours. Anemia Panel: No results for input(s): VITAMINB12, FOLATE, FERRITIN, TIBC, IRON, RETICCTPCT in the last 72 hours. Urine analysis:    Component Value Date/Time   COLORURINE YELLOW 08/21/2018 2124   APPEARANCEUR CLEAR 08/21/2018 2124   LABSPEC 1.013 08/21/2018 2124   PHURINE 5.0 08/21/2018 2124   GLUCOSEU NEGATIVE 08/21/2018 2124   HGBUR NEGATIVE 08/21/2018 2124   BILIRUBINUR NEGATIVE 08/21/2018 2124   KETONESUR NEGATIVE 08/21/2018 2124   PROTEINUR NEGATIVE 08/21/2018 2124   UROBILINOGEN 0.2 07/23/2015 1610   NITRITE NEGATIVE 08/21/2018 2124   LEUKOCYTESUR LARGE (A) 08/21/2018 2124   Sepsis Labs: @LABRCNTIP (procalcitonin:4,lacticidven:4)  ) Recent Results (from the past 240 hour(s))  MRSA PCR Screening     Status: None   Collection Time: 08/21/18 10:51 PM  Result Value Ref Range Status   MRSA by PCR NEGATIVE NEGATIVE Final    Comment:        The GeneXpert MRSA Assay  (FDA approved for NASAL specimens only), is one component of a comprehensive MRSA colonization surveillance program. It is not intended to diagnose MRSA infection nor to guide or monitor treatment for MRSA infections. Performed at [facility], [place] [address], [place], [place] [address]       Studies: Ct Abdomen Pelvis Wo Contrast  Result Date: 08/21/2018 CLINICAL DATA:  Bowel obstruction. Nausea. History of colon cancer. EXAM: CT ABDOMEN AND PELVIS WITHOUT CONTRAST TECHNIQUE: Multidetector CT imaging of the abdomen and pelvis was performed following the standard protocol without IV contrast. COMPARISON:  06/23/2018 FINDINGS: Lower chest: Motion artifact in the lung bases with chronic mosaic attenuation, possibly air trapping. Mild bibasilar subsegmental atelectasis. Unchanged 1 cm calcified pleural nodule in the anterolateral right lung base. No pleural effusion. Cardiomegaly, coronary artery atherosclerosis, and pacemaker/ICD leads. Hepatobiliary: No focal liver abnormality is seen. Small stones dependently in the gallbladder. No biliary dilatation. Pancreas: Unremarkable. Spleen: Unremarkable. Adrenals/Urinary Tract: Unremarkable adrenal glands. Unchanged punctate calcification in the inferior aspect of the right renal hilum, possibly vascular. Unchanged subcentimeter low-density lesion  in the lower pole of the right kidney, too small to fully characterize. No hydronephrosis. Moderately distended bladder. Stomach/Bowel: Sequelae of partial colectomy are again identified with a left lower quadrant ostomy. There is colonic diverticulosis without evidence of diverticulitis. A parastomal hernia is again noted containing a loop of small bowel. Small bowel within the abdomen proximal to this is dilated and fluid-filled measuring up to 3.7 cm in diameter. Distal small bowel is collapsed. Vascular/Lymphatic: Abdominal aortic atherosclerosis without aneurysm. Unchanged subcentimeter  short axis pelvic sidewall lymph nodes bilaterally. Reproductive: Status post hysterectomy. No adnexal masses. Other: No intraperitoneal free fluid. No pneumoperitoneum. Musculoskeletal: New L1 superior endplate compression fracture with 25% height loss. IMPRESSION: 1. Small bowel obstruction secondary to a parastomal hernia. 2. New L1 superior endplate compression fracture. 3.  Aortic Atherosclerosis (ICD10-I70.0). Electronically Signed   By: Logan Bores M.D.   On: 08/21/2018 16:43   Dg Abd Acute W/chest  Result Date: 08/21/2018 CLINICAL DATA:  Nausea since yesterday. One episode of vomiting. General malaise. Constipation. EXAM: DG ABDOMEN ACUTE W/ 1V CHEST COMPARISON:  Chest x-ray of March 15, 2018 and abdominal and pelvic CT scan of June 23, 2018. FINDINGS: The lungs are hypoinflated. There is chronic elevation of the right hemidiaphragm. The left retrocardiac region remains somewhat dense. The cardiac silhouette is enlarged. The central pulmonary vascularity is prominent but stable. No cephalization of the vascular pattern is observed. The ICD is in stable position. Within the abdomen there is a loop of moderately distended gas-filled small bowel at the abdominopelvic junction. No free extraluminal gas collections are observed. The colonic stool burden is not increased. The decubitus radiographs reveal no free extraluminal gas collections. IMPRESSION: Findings compatible with a partial distal small bowel obstruction or ileus. No evidence of perforation. Hypoinflation of both lungs. Probable low-grade compensated CHF. No definite pneumonia. Electronically Signed   By: David  Martinique M.D.   On: 08/21/2018 13:33   Dg Abd Portable 1v-small Bowel Obstruction Protocol-initial, 8 Hr Delay  Result Date: 08/22/2018 CLINICAL DATA:  8 hour delayed small-bowel obstruction protocol. EXAM: PORTABLE ABDOMEN - 1 VIEW COMPARISON:  Abdominal radiograph performed 08/21/2018 FINDINGS: The patient's enteric tube is seen  ending overlying the antrum of the stomach. The visualized bowel gas pattern is unremarkable. Contrast is noted within small bowel loops; no definite colonic contrast is characterized at this time. No free intra-abdominal air is identified, though evaluation for free air is limited on a single supine view. The visualized osseous structures are within normal limits; the sacroiliac joints are unremarkable in appearance. Left lower quadrant abdominal wall mesh is noted. IMPRESSION: Contrast noted within small bowel loops; no definite colonic contrast seen at this time. Would perform follow-up abdominal radiograph per small-bowel obstruction protocol. Electronically Signed   By: Garald Balding M.D.   On: 08/22/2018 06:25   Dg Abd Portable 1v-small Bowel Protocol-position Verification  Result Date: 08/21/2018 CLINICAL DATA:  Confirm gastric tube placement EXAM: PORTABLE ABDOMEN - 1 VIEW COMPARISON:  None. FINDINGS: The tip and side port of a gastric tube are noted coursing along the expected location of the gastric body and antrum. Scattered mild-to-moderately distended small bowel loops are noted in the lower quadrants. Moderate stool retention within the ascending colon and distal sigmoid region. Lower lumbar degenerative disc disease with facet arthropathy from L4 through S1. Mild joint space narrowing of the included hips. Phleboliths are present in the right hemipelvis. IMPRESSION: Gastric tube with tip and side-port is seen along the expected location of the  stomach. Mild moderate small bowel distention the lower quadrants. Electronically Signed   By: Ashley Royalty M.D.   On: 08/21/2018 19:23    Scheduled Meds: . heparin  5,000 Units Subcutaneous Q8H  . levothyroxine  25 mcg Intravenous Daily  . lip balm  1 application Topical BID  . mometasone-formoterol  2 puff Inhalation BID  .  morphine injection  2 mg Intravenous Once  . ondansetron (ZOFRAN) IV  4 mg Intravenous Once  . pantoprazole (PROTONIX) IV   40 mg Intravenous Q24H  . polyvinyl alcohol  1 drop Both Eyes TID  . sodium chloride flush  3 mL Intravenous Q12H    Continuous Infusions: . sodium chloride 75 mL/hr at 08/22/18 0500  . sodium chloride    . lactated ringers       LOS: 1 day     Alma Friendly, MD [facility]   If 7PM-7AM, please contact night-coverage [email or web address] 08/22/2018, 11:24 AM

## 2018-08-22 NOTE — Progress Notes (Signed)
I have reviewed and concur with this student's documentation.   

## 2018-08-22 NOTE — Progress Notes (Signed)
Initial Nutrition Assessment  DOCUMENTATION CODES:   Obesity unspecified  INTERVENTION:  Currently pt is NPO for possible surgery, monitor for diet advancement Recommend -Ensure Enlive po BID, each supplement provides 350 kcal and 20 grams of protein -Prostat po BID, each supplement provides 100 kcal and 15 grams of protein   NUTRITION DIAGNOSIS:   Inadequate oral intake related to altered GI function, nausea, vomiting as evidenced by other (comment)(SBO, s/p abdominoperineal proctolectomy for colon cancer).  GOAL:   Patient will meet greater than or equal to 90% of their needs   MONITOR:   Diet advancement, I & O's, Weight trends, Labs  REASON FOR ASSESSMENT:   Malnutrition Screening Tool    ASSESSMENT:  84 year old female resident of [place] w/ medical history significant for Colon Ca s/p colostomy, rectal Ca, COPD, CHF, HTN, HLD, gout presented to ED complaining of n/v x 1day.   Pt awake,sitting up in bed at time of visit and reports feeling a little better this morning. Pt stated she has been a resident of [place] for 2 years and enjoys the people there. She plays [employer]ING[employer] 2x/ week and has a lot of friends. Pt is a poor historian of dietary recall and only recalls breakfast;  boiled egg with coffee, juice, and milk.  Currently NPO for SBO, awaiting surgery consult.  Pt recalls UBW of 211lbs and reports clothing becoming loose recently.   Medications: Protonix, Zofran Na 0.9% 67mL/hr  NUTRITION - FOCUSED PHYSICAL EXAM:    Most Recent Value  Orbital Region  Mild depletion  Upper Arm Region  No depletion  Thoracic and Lumbar Region  No depletion  Buccal Region  Mild depletion  Temple Region  Mild depletion  Clavicle Bone Region  No depletion  Clavicle and Acromion Bone Region  No depletion  Scapular Bone Region  No depletion  Dorsal Hand  Mild depletion  Patellar Region  No depletion  Anterior Thigh Region  No depletion  Posterior  Calf Region  Mild depletion  Edema (RD Assessment)  Moderate  Hair  Reviewed  Eyes  Reviewed  Mouth  Reviewed  Skin  Reviewed  Nails  Reviewed       Diet Order:   Diet Order            Diet NPO time specified Except for: Ice Chips  Diet effective now              EDUCATION NEEDS:   No education needs have been identified at this time  Skin:  Skin Assessment: Reviewed RN Assessment  Last BM:  08/22/18; LUQ ostomy; 2 formed pieces  Height:   Ht Readings from Last 1 Encounters:  08/21/18 5\' 6"  (1.676 m)    Weight:   Wt Readings from Last 1 Encounters:  08/21/18 96.6 kg    Ideal Body Weight:  59.1 kg  BMI:  Body mass index is 34.38 kg/m.  Estimated Nutritional Needs:   Kcal:  2831-5176 (MSJ 1.25-1.3)  Protein:  77-95 grams (1.3-1.6g/kg IBW)  Fluid:  1.7-1.8L    Lajuan Lines, RD, LDN  After Hours/Weekend Pager: [phone]

## 2018-08-22 NOTE — Progress Notes (Addendum)
[facility] Progress Note     Subjective: CC:  Having intermittent abdominal pain that she is unable to describe. Unable to report if the stool in her bag is new or old. States that at baseline she lives in a SNF and mobilizes using a walker - she does her own laundry and likes to play bingo.  Objective: Vital signs in last 24 hours: Temp:  [98.2 F (36.8 C)-98.8 F (37.1 C)] 98.2 F (36.8 C) (11/14 0359) Pulse Rate:  [95-101] 95 (11/14 0359) Resp:  [14-21] 20 (11/14 0359) BP: (140-155)/(59-66) 155/62 (11/14 0359) SpO2:  [92 %-100 %] 99 % (11/14 0359) Weight:  [96.6 kg] 96.6 kg (11/13 1219)    Intake/Output from previous day: 11/13 0701 - 11/14 0700 In: 452 [I.V.:452] Out: 800 [Urine:400; Emesis/NG output:400] Intake/Output this shift: No intake/output data recorded.  PE: Gen:  Alert, NAD, pleasant  Card:  Regular rate and rhythm, pedal pulses 2+ BL Pulm:  Normal effort, clear to auscultation bilaterally Abd: Soft, mild TTP, hypoactive BS, stoma LLQ covered with thick, firm stool. No gas in colostomy pouch. NG with ~600 cc bilious fluid in cannister. Skin: warm and dry, no rashes  Psych: A&Ox3   Lab Results:  Recent Labs    08/21/18 1425 08/22/18 0350  WBC 9.5 7.3  HGB 9.2* 9.3*  HCT 31.5* 32.2*  PLT 307 307   BMET Recent Labs    08/21/18 1425 08/22/18 0350  NA 141 142  K 5.0 4.6  CL 101 104  CO2 32 26  GLUCOSE 99 94  BUN 45* 42*  CREATININE 1.83* 1.83*  CALCIUM 9.7 9.3   PT/INR No results for input(s): LABPROT, INR in the last 72 hours. CMP     Component Value Date/Time   NA 142 08/22/2018 0350   K 4.6 08/22/2018 0350   CL 104 08/22/2018 0350   CO2 26 08/22/2018 0350   GLUCOSE 94 08/22/2018 0350   BUN 42 (H) 08/22/2018 0350   CREATININE 1.83 (H) 08/22/2018 0350   CREATININE 0.83 04/14/2015 1514   CALCIUM 9.3 08/22/2018 0350   PROT 8.6 (H) 08/21/2018 1425   ALBUMIN 3.6 08/21/2018 1425   AST 19 08/21/2018 1425   ALT 24  08/21/2018 1425   ALKPHOS 81 08/21/2018 1425   BILITOT 0.2 (L) 08/21/2018 1425   GFRNONAA 25 (L) 08/22/2018 0350   GFRAA 28 (L) 08/22/2018 0350   Lipase     Component Value Date/Time   LIPASE 24 08/21/2018 1425       Studies/Results: Ct Abdomen Pelvis Wo Contrast  Result Date: 08/21/2018 CLINICAL DATA:  Bowel obstruction. Nausea. History of colon cancer. EXAM: CT ABDOMEN AND PELVIS WITHOUT CONTRAST TECHNIQUE: Multidetector CT imaging of the abdomen and pelvis was performed following the standard protocol without IV contrast. COMPARISON:  06/23/2018 FINDINGS: Lower chest: Motion artifact in the lung bases with chronic mosaic attenuation, possibly air trapping. Mild bibasilar subsegmental atelectasis. Unchanged 1 cm calcified pleural nodule in the anterolateral right lung base. No pleural effusion. Cardiomegaly, coronary artery atherosclerosis, and pacemaker/ICD leads. Hepatobiliary: No focal liver abnormality is seen. Small stones dependently in the gallbladder. No biliary dilatation. Pancreas: Unremarkable. Spleen: Unremarkable. Adrenals/Urinary Tract: Unremarkable adrenal glands. Unchanged punctate calcification in the inferior aspect of the right renal hilum, possibly vascular. Unchanged subcentimeter low-density lesion in the lower pole of the right kidney, too small to fully characterize. No hydronephrosis. Moderately distended bladder. Stomach/Bowel: Sequelae of partial colectomy are again identified with a left lower quadrant ostomy. There is  colonic diverticulosis without evidence of diverticulitis. A parastomal hernia is again noted containing a loop of small bowel. Small bowel within the abdomen proximal to this is dilated and fluid-filled measuring up to 3.7 cm in diameter. Distal small bowel is collapsed. Vascular/Lymphatic: Abdominal aortic atherosclerosis without aneurysm. Unchanged subcentimeter short axis pelvic sidewall lymph nodes bilaterally. Reproductive: Status post  hysterectomy. No adnexal masses. Other: No intraperitoneal free fluid. No pneumoperitoneum. Musculoskeletal: New L1 superior endplate compression fracture with 25% height loss. IMPRESSION: 1. Small bowel obstruction secondary to a parastomal hernia. 2. New L1 superior endplate compression fracture. 3.  Aortic Atherosclerosis (ICD10-I70.0). Electronically Signed   By: Logan Bores M.D.   On: 08/21/2018 16:43   Dg Abd Acute W/chest  Result Date: 08/21/2018 CLINICAL DATA:  Nausea since yesterday. One episode of vomiting. General malaise. Constipation. EXAM: DG ABDOMEN ACUTE W/ 1V CHEST COMPARISON:  Chest x-ray of March 15, 2018 and abdominal and pelvic CT scan of June 23, 2018. FINDINGS: The lungs are hypoinflated. There is chronic elevation of the right hemidiaphragm. The left retrocardiac region remains somewhat dense. The cardiac silhouette is enlarged. The central pulmonary vascularity is prominent but stable. No cephalization of the vascular pattern is observed. The ICD is in stable position. Within the abdomen there is a loop of moderately distended gas-filled small bowel at the abdominopelvic junction. No free extraluminal gas collections are observed. The colonic stool burden is not increased. The decubitus radiographs reveal no free extraluminal gas collections. IMPRESSION: Findings compatible with a partial distal small bowel obstruction or ileus. No evidence of perforation. Hypoinflation of both lungs. Probable low-grade compensated CHF. No definite pneumonia. Electronically Signed   By: David  Martinique M.D.   On: 08/21/2018 13:33   Dg Abd Portable 1v-small Bowel Obstruction Protocol-initial, 8 Hr Delay  Result Date: 08/22/2018 CLINICAL DATA:  8 hour delayed small-bowel obstruction protocol. EXAM: PORTABLE ABDOMEN - 1 VIEW COMPARISON:  Abdominal radiograph performed 08/21/2018 FINDINGS: The patient's enteric tube is seen ending overlying the antrum of the stomach. The visualized bowel gas pattern  is unremarkable. Contrast is noted within small bowel loops; no definite colonic contrast is characterized at this time. No free intra-abdominal air is identified, though evaluation for free air is limited on a single supine view. The visualized osseous structures are within normal limits; the sacroiliac joints are unremarkable in appearance. Left lower quadrant abdominal wall mesh is noted. IMPRESSION: Contrast noted within small bowel loops; no definite colonic contrast seen at this time. Would perform follow-up abdominal radiograph per small-bowel obstruction protocol. Electronically Signed   By: Garald Balding M.D.   On: 08/22/2018 06:25   Dg Abd Portable 1v-small Bowel Protocol-position Verification  Result Date: 08/21/2018 CLINICAL DATA:  Confirm gastric tube placement EXAM: PORTABLE ABDOMEN - 1 VIEW COMPARISON:  None. FINDINGS: The tip and side port of a gastric tube are noted coursing along the expected location of the gastric body and antrum. Scattered mild-to-moderately distended small bowel loops are noted in the lower quadrants. Moderate stool retention within the ascending colon and distal sigmoid region. Lower lumbar degenerative disc disease with facet arthropathy from L4 through S1. Mild joint space narrowing of the included hips. Phleboliths are present in the right hemipelvis. IMPRESSION: Gastric tube with tip and side-port is seen along the expected location of the stomach. Mild moderate small bowel distention the lower quadrants. Electronically Signed   By: Ashley Royalty M.D.   On: 08/21/2018 19:23    Anti-infectives: Anti-infectives (From admission, onward)   None  Assessment/Plan SBO - recurrent near the level of her colostomy. - hx rectal CA s/p abdominal perineal resection with permanent colostomy 10/2004, LOA and peristomal hernia repair 2007, both by Dr. Dalbert Batman - SB protocol, 8h delay this AM shows contrast in small bowel, repeat in AM - continue NG to LIWS - OOB!       LOS: 1 day    Obie Dredge, [facility] Pager: [phone]

## 2018-08-23 ENCOUNTER — Inpatient Hospital Stay (HOSPITAL_COMMUNITY): Payer: Medicare HMO

## 2018-08-23 MED ORDER — CARVEDILOL 6.25 MG PO TABS: 6.2500 mg | ORAL_TABLET | Freq: Two times a day (BID) | ORAL | Status: DC

## 2018-08-23 MED ORDER — PRO-STAT SUGAR FREE PO LIQD
30.0000 mL | Freq: Two times a day (BID) | ORAL | Status: DC
  Administered 2018-08-23 – 2018-08-26 (×7): 30 mL via ORAL
  Filled 2018-08-23 (×7): qty 30

## 2018-08-23 MED ORDER — CYCLOBENZAPRINE HCL 5 MG PO TABS
5.0000 mg | ORAL_TABLET | Freq: Two times a day (BID) | ORAL | Status: DC | PRN
  Administered 2018-08-23 – 2018-08-26 (×3): 5 mg via ORAL
  Filled 2018-08-23 (×3): qty 1

## 2018-08-23 MED ORDER — CARVEDILOL 6.25 MG PO TABS
6.2500 mg | ORAL_TABLET | Freq: Two times a day (BID) | ORAL | Status: DC
  Administered 2018-08-23 – 2018-08-26 (×7): 6.25 mg via ORAL
  Filled 2018-08-23 (×7): qty 1

## 2018-08-23 MED ORDER — CYCLOBENZAPRINE HCL 5 MG PO TABS
5.0000 mg | ORAL_TABLET | Freq: Every day | ORAL | Status: DC
  Administered 2018-08-24 – 2018-08-25 (×2): 5 mg via ORAL
  Filled 2018-08-23 (×3): qty 1

## 2018-08-23 NOTE — NC FL2 (Addendum)
[place] MEDICAID FL2 LEVEL OF CARE SCREENING TOOL     IDENTIFICATION  Patient Name: [name] Birthdate: 1934-06-18 Sex: female Admission Date (Current Location): 08/21/2018  [facility] and [place] Number:  Herbalist and Address:  [facility],  [place], [place]      Provider Number: [id number]  Attending Physician Name and Address:  Alma Friendly, MD  Relative Name and Phone Number:       Current Level of Care: Hospital Recommended Level of Care: [place] Prior Approval Number:    Date Approved/Denied:   PASRR Number: [id number]  Discharge Plan: SNF    Current Diagnoses: Patient Active Problem List   Diagnosis Date Noted  . Recurrent parastomal hernia at LLQ colostomy 08/21/2018  . SBO (small bowel obstruction) ([place]) 08/21/2018  . Chest pain 10/28/2017  . Aspiration of liquid 10/28/2017  . Bilateral lower extremity edema   . Pneumonia 04/22/2017  . Altered mental status   . Hypotension 02/16/2017  . Syncope 02/02/2016  . Faintness   . Essential hypertension 11/16/2015  . Hypothyroidism 11/16/2015  . Acute on chronic systolic CHF (congestive heart failure) ([place]) 11/15/2015  . Hypertensive heart disease 10/04/2015  . Elevated troponin 10/04/2015  . Acute respiratory failure ([place])   . Acute systolic congestive heart failure ([place])   . NICM (nonischemic cardiomyopathy) ([place]) 08/11/2015  . Normal coronary arteries 08/11/2015  . Dyslipidemia 07/27/2015  . CKD (chronic kidney disease) stage 3, GFR 30-59 ml/min (HCC) 07/27/2015  . Anemia 07/27/2015  . Benign essential HTN 07/27/2015  . Hypothyroidism, adult 07/27/2015  . FUO (fever of unknown origin) 07/27/2015  . IDA (iron deficiency anemia) 07/27/2015  . Chronic systolic CHF (congestive heart failure) ([place]) 07/27/2015  . Rectal cancer ypTypN0 (0/13) s/p chemoXRT/ APR/colostomy 2006 07/27/2015  . Sepsis ([place]) 07/24/2015  . Acute encephalopathy  07/24/2015  . Hypokalemia 07/24/2015  . Dementia ([place]) 07/23/2015  . COPD (chronic obstructive pulmonary disease) ([place]) 04/25/2015  . ICD in place 04/14/2015  . Colostomy in place [facility]) 03/01/2015  . Pacemaker 11/07/2012  . Anxiety 10/04/2011    Orientation RESPIRATION BLADDER Height & Weight     Self, Time, Situation, Place  Normal Continent Weight: 213 lb (96.6 kg) Height:  5\' 6"  (167.6 cm)  BEHAVIORAL SYMPTOMS/MOOD NEUROLOGICAL BOWEL NUTRITION STATUS      Colostomy Diet(See discharge Summary )  AMBULATORY STATUS COMMUNICATION OF NEEDS Skin   Extensive Assist Verbally Normal                       Personal Care Assistance Level of Assistance  Bathing, Feeding, Dressing Bathing Assistance: Limited assistance Feeding assistance: Independent Dressing Assistance: Limited assistance     Functional Limitations Info  Sight, Hearing, Speech Sight Info: Adequate Hearing Info: Adequate Speech Info: Adequate    SPECIAL CARE FACTORS FREQUENCY  PT (By licensed PT), OT (By licensed OT)     PT Frequency: 5x/week OT Frequency: 5x/week             Contractures Contractures Info: Not present    Additional Factors Info  Code Status, Allergies, Psychotropic Code Status Info: Fullcode  Allergies Info: Allergies: No Known Allergies           Current Medications (08/23/2018):  This is the current hospital active medication list Current Facility-Administered Medications  Medication Dose Route Frequency Provider Last Rate Last Dose  . 0.9 %  sodium chloride infusion   Intravenous Continuous Lesia Sago  J, MD 50 mL/hr at 08/22/18 1700    . 0.9 %  sodium chloride infusion  250 mL Intravenous PRN Alma Friendly, MD      . acetaminophen (TYLENOL) tablet 650 mg  650 mg Oral Q6H PRN Alma Friendly, MD   650 mg at 08/23/18 0205   Or  . acetaminophen (TYLENOL) suppository 650 mg  650 mg Rectal Q6H PRN Alma Friendly, MD      . albuterol (PROVENTIL) (2.5  MG/3ML) 0.083% nebulizer solution 2.5 mg  2.5 mg Nebulization Q4H PRN Alma Friendly, MD      . alum & mag hydroxide-simeth (MAALOX/MYLANTA) 200-200-20 MG/5ML suspension 30 mL  30 mL Oral Q6H PRN Michael Boston, MD      . guaiFENesin-dextromethorphan (ROBITUSSIN DM) 100-10 MG/5ML syrup 10 mL  10 mL Oral Q4H PRN Michael Boston, MD      . heparin injection 5,000 Units  5,000 Units Subcutaneous Q8H Alma Friendly, MD   5,000 Units at 08/23/18 0541  . hydrALAZINE (APRESOLINE) injection 10 mg  10 mg Intravenous Q8H PRN Alma Friendly, MD      . hydrocortisone (ANUSOL-HC) 2.5 % rectal cream 1 application  1 application Topical QID PRN Michael Boston, MD      . hydrocortisone cream 1 % 1 application  1 application Topical TID PRN Michael Boston, MD      . lactated ringers bolus 1,000 mL  1,000 mL Intravenous Once Michael Boston, MD      . levothyroxine (SYNTHROID, LEVOTHROID) injection 25 mcg  25 mcg Intravenous Daily Alma Friendly, MD   25 mcg at 08/23/18 0839  . lip balm (CARMEX) ointment 1 application  1 application Topical BID Michael Boston, MD   1 application at 26/71/24 0840  . magic mouthwash  15 mL Oral QID PRN Michael Boston, MD      . menthol-cetylpyridinium (CEPACOL) lozenge 3 mg  1 lozenge Oral PRN Michael Boston, MD      . mometasone-formoterol [facility]) 200-5 MCG/ACT inhaler 2 puff  2 puff Inhalation BID Alma Friendly, MD   2 puff at 08/23/18 1101  . morphine 2 MG/ML injection 2 mg  2 mg Intravenous Once Valarie Merino, MD      . ondansetron [facility]) injection 4 mg  4 mg Intravenous Once Valarie Merino, MD      . ondansetron [facility]) tablet 4 mg  4 mg Oral Q6H PRN Alma Friendly, MD       Or  . ondansetron [facility]) injection 4 mg  4 mg Intravenous Q6H PRN Alma Friendly, MD   4 mg at 08/22/18 0453  . pantoprazole (PROTONIX) injection 40 mg  40 mg Intravenous Q24H Alma Friendly, MD   40 mg at 08/23/18 0837  . phenol (CHLORASEPTIC) mouth spray  1-2 spray  1-2 spray Mouth/Throat PRN Michael Boston, MD      . polyvinyl alcohol (LIQUIFILM TEARS) 1.4 % ophthalmic solution 1 drop  1 drop Both Eyes TID Alma Friendly, MD   1 drop at 08/23/18 0842  . sodium chloride flush (NS) 0.9 % injection 3 mL  3 mL Intravenous Q12H Alma Friendly, MD   3 mL at 08/23/18 0842  . sodium chloride flush (NS) 0.9 % injection 3 mL  3 mL Intravenous PRN Alma Friendly, MD         Discharge Medications: Please see discharge summary for a list of discharge medications.  Relevant Imaging Results:  Relevant Lab Results:   Additional Information ssn: [phone]  Lia Hopping, LCSW

## 2018-08-23 NOTE — Evaluation (Signed)
Physical Therapy Evaluation Patient Details Name: [name] MRN: [id number] DOB: 1934/03/01 Today's Date: 08/23/2018   History of Present Illness  84 yo presented to ED 08/21/18 with N/V, SBO. H/O CHF, colostomy  Clinical Impression  The patient is anxious about NG tube coming out. Wanting  A lot to drink. Patient required 2 assist to get to bed edge. Required STEDY lift equipment for safe transfer to recliner. Patient was ambulatory PTA per pt and chart. Patient may benefit from SNF prior to return to SNF. Pt admitted with above diagnosis. Pt currently with functional limitations due to the deficits listed below (see PT Problem List).  Pt will benefit from skilled PT to increase their independence and safety with mobility to allow discharge to the venue listed below.   Patient complains of left UE pain, noted edema, especially hand and wrist and is not desiring to use it.     Follow Up Recommendations SNF    Equipment Recommendations  None recommended by PT    Recommendations for Other Services   OT    Precautions / Restrictions Precautions Precautions: Fall Precaution Comments: painful left hand(?from IV), colostomy, INCONTINENCE OF URINE      Mobility  Bed Mobility Overal bed mobility: Needs Assistance Bed Mobility: Supine to Sit     Supine to sit: Max assist;+2 for physical assistance;+2 for safety/equipment;HOB elevated     General bed mobility comments: use of bed pad to scoot hips, assist legs and trunk   Transfers Overall transfer level: Needs assistance   Transfers: Sit to/from Stand Sit to Stand: Max assist;+2 physical assistance;+2 safety/equipment;From elevated surface         General transfer comment: Patient only able to reach to [facility] bar with right arm, left UE painful  and edematous. Assist to rise from bed  on STEDY. Unable to place flaps down due to habitus and not fully erect. Patient able to remain standing while turned to recliner and assisted  to sit down  Ambulation/Gait                Stairs            Wheelchair Mobility    Modified Rankin (Stroke Patients Only)       Balance Overall balance assessment: Needs assistance Sitting-balance support: Feet supported;Bilateral upper extremity supported Sitting balance-Leahy Scale: Fair     Standing balance support: During functional activity;Bilateral upper extremity supported Standing balance-Leahy Scale: Poor                               Pertinent Vitals/Pain Pain Assessment: Faces Faces Pain Scale: Hurts whole lot Pain Location: when left hand/arm is touched or moved. Pain Descriptors / Indicators: Grimacing;Guarding;Discomfort;Moaning;Sharp Pain Intervention(s): Monitored during session;Limited activity within patient's tolerance    Home Living Family/patient expects to be discharged to:: Assisted living               Home Equipment: Walker - 4 wheels Additional Comments: pt reports she dresses self and walks with rollator    Prior Function Level of Independence: Needs assistance               Hand Dominance        Extremity/Trunk Assessment   Upper Extremity Assessment Upper Extremity Assessment: LUE deficits/detail LUE Deficits / Details: decreased finger flexion to grip STEDY, decrease shoulder elevation to reach to STEDY, ? due to hand pain, noted edema of hand and  wrist. IV in anticubital    Lower Extremity Assessment Lower Extremity Assessment: Generalized weakness    Cervical / Trunk Assessment Cervical / Trunk Assessment: Normal  Communication      Cognition Arousal/Alertness: Awake/alert Behavior During Therapy: WFL for tasks assessed/performed;Anxious Overall Cognitive Status: No family/caregiver present to determine baseline cognitive functioning Area of Impairment: Orientation;Memory                 Orientation Level: Time             General Comments: foloows commands readily       General Comments      Exercises     Assessment/Plan    PT Assessment Patient needs continued PT services  PT Problem List Decreased strength;Decreased activity tolerance;Decreased range of motion;Decreased mobility;Pain;Decreased knowledge of precautions;Decreased safety awareness;Decreased knowledge of use of DME;Decreased cognition       PT Treatment Interventions DME instruction;Gait training;Functional mobility training;Therapeutic activities;Therapeutic exercise;Patient/family education    PT Goals (Current goals can be found in the Care Plan section)  Acute Rehab PT Goals Patient Stated Goal: to get this tube out. To get up to chair, walk with her walker PT Goal Formulation: With patient Time For Goal Achievement: 09/06/18 Potential to Achieve Goals: Good    Frequency Min 2X/week   Barriers to discharge        Co-evaluation               AM-PAC PT "6 Clicks" Daily Activity  Outcome Measure Difficulty turning over in bed (including adjusting bedclothes, sheets and blankets)?: Unable Difficulty moving from lying on back to sitting on the side of the bed? : Unable Difficulty sitting down on and standing up from a chair with arms (e.g., wheelchair, bedside commode, etc,.)?: Unable Help needed moving to and from a bed to chair (including a wheelchair)?: Total Help needed walking in hospital room?: Total Help needed climbing 3-5 steps with a railing? : Total 6 Click Score: 6    End of Session Equipment Utilized During Treatment: Gait belt Activity Tolerance: Patient tolerated treatment well Patient left: in chair;with call bell/phone within reach;with chair alarm set Nurse Communication: Mobility status;Need for lift equipment PT Visit Diagnosis: Unsteadiness on feet (R26.81)    Time: 6967-8938 PT Time Calculation (min) (ACUTE ONLY): 49 min   Charges:   PT Evaluation $PT Eval Moderate Complexity: 1 Mod PT Treatments $Therapeutic Activity: 8-22  mins $Self Care/Home Management: [place] Pager [phone] Office [phone]   Claretha Cooper 08/23/2018, 10:20 AM

## 2018-08-23 NOTE — Progress Notes (Signed)
[facility] Progress Note     Subjective: CC:  Wants NG tube out. Denies abd pain. Denies ostomy output. NG tube with 700-800 out overnight. Objective: Vital signs in last 24 hours: Temp:  [97.6 F (36.4 C)-99.6 F (37.6 C)] 97.6 F (36.4 C) (11/15 0529) Pulse Rate:  [88-105] 88 (11/15 0529) Resp:  [18-20] 20 (11/15 0529) BP: (133-159)/(58-71) 133/58 (11/15 0529) SpO2:  [92 %-100 %] 100 % (11/15 0529) Last BM Date: (Colostomy and has not put out today, But hastool on arrival )  Intake/Output from previous day: 11/14 0701 - 11/15 0700 In: 1050.7 [P.O.:50; I.V.:1000.7] Out: 1165 [Urine:940; Emesis/NG output:225] Intake/Output this shift: No intake/output data recorded.  PE: Gen:  Alert, NAD, pleasant Card:  Regular rate and rhythm, pedal pulses 2+ BL Pulm:  Normal effort, clear to auscultation bilaterally Abd: Soft, mild distention, non-tender, no peritonitis, +BS, LLQ colostomy with viable stoma, ostomy pouch flat without gas or stool Skin: warm and dry, no rashes  Psych: A&Ox3   Lab Results:  Recent Labs    08/21/18 1425 08/22/18 0350  WBC 9.5 7.3  HGB 9.2* 9.3*  HCT 31.5* 32.2*  PLT 307 307   BMET Recent Labs    08/21/18 1425 08/22/18 0350  NA 141 142  K 5.0 4.6  CL 101 104  CO2 32 26  GLUCOSE 99 94  BUN 45* 42*  CREATININE 1.83* 1.83*  CALCIUM 9.7 9.3   PT/INR No results for input(s): LABPROT, INR in the last 72 hours. CMP     Component Value Date/Time   NA 142 08/22/2018 0350   K 4.6 08/22/2018 0350   CL 104 08/22/2018 0350   CO2 26 08/22/2018 0350   GLUCOSE 94 08/22/2018 0350   BUN 42 (H) 08/22/2018 0350   CREATININE 1.83 (H) 08/22/2018 0350   CREATININE 0.83 04/14/2015 1514   CALCIUM 9.3 08/22/2018 0350   PROT 8.6 (H) 08/21/2018 1425   ALBUMIN 3.6 08/21/2018 1425   AST 19 08/21/2018 1425   ALT 24 08/21/2018 1425   ALKPHOS 81 08/21/2018 1425   BILITOT 0.2 (L) 08/21/2018 1425   GFRNONAA 25 (L) 08/22/2018 0350   GFRAA 28  (L) 08/22/2018 0350   Lipase     Component Value Date/Time   LIPASE 24 08/21/2018 1425       Studies/Results: Ct Abdomen Pelvis Wo Contrast  Result Date: 08/21/2018 CLINICAL DATA:  Bowel obstruction. Nausea. History of colon cancer. EXAM: CT ABDOMEN AND PELVIS WITHOUT CONTRAST TECHNIQUE: Multidetector CT imaging of the abdomen and pelvis was performed following the standard protocol without IV contrast. COMPARISON:  06/23/2018 FINDINGS: Lower chest: Motion artifact in the lung bases with chronic mosaic attenuation, possibly air trapping. Mild bibasilar subsegmental atelectasis. Unchanged 1 cm calcified pleural nodule in the anterolateral right lung base. No pleural effusion. Cardiomegaly, coronary artery atherosclerosis, and pacemaker/ICD leads. Hepatobiliary: No focal liver abnormality is seen. Small stones dependently in the gallbladder. No biliary dilatation. Pancreas: Unremarkable. Spleen: Unremarkable. Adrenals/Urinary Tract: Unremarkable adrenal glands. Unchanged punctate calcification in the inferior aspect of the right renal hilum, possibly vascular. Unchanged subcentimeter low-density lesion in the lower pole of the right kidney, too small to fully characterize. No hydronephrosis. Moderately distended bladder. Stomach/Bowel: Sequelae of partial colectomy are again identified with a left lower quadrant ostomy. There is colonic diverticulosis without evidence of diverticulitis. A parastomal hernia is again noted containing a loop of small bowel. Small bowel within the abdomen proximal to this is dilated and fluid-filled measuring up to 3.7  cm in diameter. Distal small bowel is collapsed. Vascular/Lymphatic: Abdominal aortic atherosclerosis without aneurysm. Unchanged subcentimeter short axis pelvic sidewall lymph nodes bilaterally. Reproductive: Status post hysterectomy. No adnexal masses. Other: No intraperitoneal free fluid. No pneumoperitoneum. Musculoskeletal: New L1 superior endplate  compression fracture with 25% height loss. IMPRESSION: 1. Small bowel obstruction secondary to a parastomal hernia. 2. New L1 superior endplate compression fracture. 3.  Aortic Atherosclerosis (ICD10-I70.0). Electronically Signed   By: Logan Bores M.D.   On: 08/21/2018 16:43   Dg Abd Acute W/chest  Result Date: 08/21/2018 CLINICAL DATA:  Nausea since yesterday. One episode of vomiting. General malaise. Constipation. EXAM: DG ABDOMEN ACUTE W/ 1V CHEST COMPARISON:  Chest x-ray of March 15, 2018 and abdominal and pelvic CT scan of June 23, 2018. FINDINGS: The lungs are hypoinflated. There is chronic elevation of the right hemidiaphragm. The left retrocardiac region remains somewhat dense. The cardiac silhouette is enlarged. The central pulmonary vascularity is prominent but stable. No cephalization of the vascular pattern is observed. The ICD is in stable position. Within the abdomen there is a loop of moderately distended gas-filled small bowel at the abdominopelvic junction. No free extraluminal gas collections are observed. The colonic stool burden is not increased. The decubitus radiographs reveal no free extraluminal gas collections. IMPRESSION: Findings compatible with a partial distal small bowel obstruction or ileus. No evidence of perforation. Hypoinflation of both lungs. Probable low-grade compensated CHF. No definite pneumonia. Electronically Signed   By: David  Martinique M.D.   On: 08/21/2018 13:33   Dg Abd Portable 1v  Result Date: 08/23/2018 CLINICAL DATA:  Small-bowel obstruction. EXAM: PORTABLE ABDOMEN - 1 VIEW COMPARISON:  08/22/2018.  CT 08/21/2018. FINDINGS: NG tube noted with tip over the stomach. Cardiac pacer noted. Surgical clips noted over the left lower abdomen. No bowel distention. Stool noted throughout the colon. Contrast in the colon. Pelvic calcifications consistent phleboliths. IMPRESSION: NG tube noted with tip over the stomach. No bowel distention. Contrast in the colon.  Electronically Signed   By: Marcello Moores  Register   On: 08/23/2018 08:25   Dg Abd Portable 1v-small Bowel Obstruction Protocol-initial, 8 Hr Delay  Result Date: 08/22/2018 CLINICAL DATA:  8 hour delayed small-bowel obstruction protocol. EXAM: PORTABLE ABDOMEN - 1 VIEW COMPARISON:  Abdominal radiograph performed 08/21/2018 FINDINGS: The patient's enteric tube is seen ending overlying the antrum of the stomach. The visualized bowel gas pattern is unremarkable. Contrast is noted within small bowel loops; no definite colonic contrast is characterized at this time. No free intra-abdominal air is identified, though evaluation for free air is limited on a single supine view. The visualized osseous structures are within normal limits; the sacroiliac joints are unremarkable in appearance. Left lower quadrant abdominal wall mesh is noted. IMPRESSION: Contrast noted within small bowel loops; no definite colonic contrast seen at this time. Would perform follow-up abdominal radiograph per small-bowel obstruction protocol. Electronically Signed   By: Garald Balding M.D.   On: 08/22/2018 06:25   Dg Abd Portable 1v-small Bowel Protocol-position Verification  Result Date: 08/21/2018 CLINICAL DATA:  Confirm gastric tube placement EXAM: PORTABLE ABDOMEN - 1 VIEW COMPARISON:  None. FINDINGS: The tip and side port of a gastric tube are noted coursing along the expected location of the gastric body and antrum. Scattered mild-to-moderately distended small bowel loops are noted in the lower quadrants. Moderate stool retention within the ascending colon and distal sigmoid region. Lower lumbar degenerative disc disease with facet arthropathy from L4 through S1. Mild joint space narrowing of the  included hips. Phleboliths are present in the right hemipelvis. IMPRESSION: Gastric tube with tip and side-port is seen along the expected location of the stomach. Mild moderate small bowel distention the lower quadrants. Electronically Signed    By: Ashley Royalty M.D.   On: 08/21/2018 19:23    Anti-infectives: Anti-infectives (From admission, onward)   None     Assessment/Plan SBO - recurrent near the level of her colostomy. - hx rectal CA s/p abdominal perineal resection with permanent colostomy 10/2004, LOA and peristomal hernia repair 2007, both by Dr. Dalbert Batman - SB protocol, contrast in her colon on abdominal film this morning - NG with increasing output this morning (800 cc), clamp NG, allow clears around tube. NG out this afternoon if tolerates this. - OOB!    FEN - NGT tube clamped, clears ID - none VTE - SCDs, subcutaneous heparin    LOS: 2 days    Obie Dredge, [facility] Surgery Pager: [phone]

## 2018-08-23 NOTE — Evaluation (Signed)
Occupational Therapy Evaluation Patient Details Name: [name] MRN: [id number] DOB: 1933-11-11 Today's Date: 08/23/2018    History of Present Illness 84 yo presented to ED 08/21/18 with N/V, SBO. H/O CHF, colostomy   Clinical Impression   Pt presents with above diagnoses, significantly limited in functional mobility and BADL performance at date of evaluation. Pt presents with painful LUE, stating it is from IV. Pt will not allow OT to touch or mobilize this extremity, but would occasionally use RUE to pick up arm for repositioning. Pt asking for hot pack, hot pack applied. Engaged pt in table top BADL, bed level. Currently pt is min-mod A for grooming due to difficulty opening packages with no BUE engagement. Pt presenting as dependent, stating she cannot do simple tasks and asking OT to do for her. Pt washed face with supply set up. Requesting to apply lotion, crying out in pain and not placing on LUE without total A from OT. Further OOB activity adamantly declined from pt. At this time, pt will need SNF level of care to maximize safety and function in BADL tasks. OT will continue to follow pt acutely.     Follow Up Recommendations  SNF    Equipment Recommendations  Other (comment)(defer to next venue)    Recommendations for Other Services       Precautions / Restrictions Precautions Precautions: Fall Precaution Comments: painful left hand(?from IV), colostomy, INCONTINENCE OF URINE Restrictions Weight Bearing Restrictions: No      Mobility Bed Mobility               General bed mobility comments: adamently declined t/f; max A +2 to scoot up in bed  Transfers                      Balance                                           ADL either performed or assessed with clinical judgement   ADL Overall ADL's : Needs assistance/impaired Eating/Feeding: Minimal assistance;Moderate assistance;Bed level Eating/Feeding Details (indicate cue  type and reason): to open packaging and feed self. Pt presenting as dependent, requesting OT to get objects to mouth. Does not engage LUE, making utensil manipulation difficult Grooming: Minimal assistance;Moderate assistance;Wash/dry face;Oral care Grooming Details (indicate cue type and reason): pt needing A to open tooth paste and place tooth paste on brush, no BUE engagement throughout activity Upper Body Bathing: Bed level;Maximal assistance Upper Body Bathing Details (indicate cue type and reason): will not engage LUE Lower Body Bathing: Total assistance;Bed level   Upper Body Dressing : Maximal assistance;Total assistance;Bed level   Lower Body Dressing: Total assistance;Bed level   Toilet Transfer: Total assistance;+2 for physical assistance;+2 for safety/equipment;BSC Toilet Transfer Details (indicate cue type and reason): incontinent of urine Toileting- Clothing Manipulation and Hygiene: Total assistance;Bed level   Tub/ Shower Transfer: Total assistance   Functional mobility during ADLs: Maximal assistance;Total assistance General ADL Comments: Pt presenting wiht generalized weakness and pain limiting ability to engage in BADL activity past bed level. Pt needing significant A for supply set up due to decreased ability to engage LUE. Pt refusing to leave bed, and requiring min-mod A to compelte table top activities bed level     Vision Baseline Vision/History: No visual deficits Patient Visual Report: No change from baseline  Perception     Praxis      Pertinent Vitals/Pain Pain Assessment: Faces Faces Pain Scale: Hurts whole lot Pain Location: when left hand/arm is touched or moved. Pain Descriptors / Indicators: Grimacing;Guarding;Discomfort;Moaning;Sharp Pain Intervention(s): Monitored during session;Heat applied;Limited activity within patient's tolerance     Hand Dominance     Extremity/Trunk Assessment Upper Extremity Assessment Upper Extremity  Assessment: LUE deficits/detail;Generalized weakness;RUE deficits/detail RUE Deficits / Details: reports pain and states "don't touch my elbow, arthritis", minimal engagement with activity, asking OT to hold objects, very weak grip strength RUE: Unable to fully assess due to pain LUE Deficits / Details: reports increased pain, would not let OT touch. Uses RUE to move LUE onto hot pack. will cry out on some occaisons, not consistent LUE: Unable to fully assess due to pain   Lower Extremity Assessment Lower Extremity Assessment: Generalized weakness       Communication Communication Communication: No difficulties   Cognition Arousal/Alertness: Awake/alert Behavior During Therapy: WFL for tasks assessed/performed;Anxious Overall Cognitive Status: No family/caregiver present to determine baseline cognitive functioning Area of Impairment: Orientation;Memory                 Orientation Level: Time   Memory: Decreased short-term memory         General Comments: follows command, needs further testing. Pt crying out about arms at times, other times not   General Comments       Exercises     Shoulder Instructions      Home Living Family/patient expects to be discharged to:: Assisted living                             Home Equipment: Walker - 4 wheels          Prior Functioning/Environment Level of Independence: Needs assistance  Gait / Transfers Assistance Needed: pt reports walking with rollator ADL's / Homemaking Assistance Needed: meals provided per facility, pt states she has aide in shower with her. Some assist in LB dressing            OT Problem List: Decreased strength;Decreased knowledge of use of DME or AE;Decreased activity tolerance;Impaired UE functional use;Impaired balance (sitting and/or standing);Pain      OT Treatment/Interventions: Self-care/ADL training;Therapeutic exercise;Therapeutic activities;DME and/or AE  instruction;Patient/family education    OT Goals(Current goals can be found in the care plan section) Acute Rehab OT Goals Patient Stated Goal: to get my L arm to stop hurting OT Goal Formulation: With patient Time For Goal Achievement: 09/06/18 Potential to Achieve Goals: Good  OT Frequency: Min 2X/week   Barriers to D/C:            Co-evaluation              AM-PAC PT "6 Clicks" Daily Activity     Outcome Measure Help from another person eating meals?: A Lot Help from another person taking care of personal grooming?: A Lot Help from another person toileting, which includes using toliet, bedpan, or urinal?: Total Help from another person bathing (including washing, rinsing, drying)?: Total Help from another person to put on and taking off regular upper body clothing?: Total Help from another person to put on and taking off regular lower body clothing?: Total 6 Click Score: 8   End of Session Nurse Communication: Mobility status  Activity Tolerance: Patient limited by pain;Patient limited by fatigue Patient left: in bed;with call bell/phone within reach;with bed alarm set  OT  Visit Diagnosis: Muscle weakness (generalized) (M62.81);Other abnormalities of gait and mobility (R26.89);Pain Pain - Right/Left: Left Pain - part of body: Arm;Hand                Time: 9024-0973 OT Time Calculation (min): 32 min Charges:  OT General Charges $OT Visit: 1 Visit OT Evaluation $OT Eval Moderate Complexity: 1 Mod OT Treatments $Self Care/Home Management : 8-22 mins  Zenovia Jarred, MSOT, OTR/L Behavioral Health OT/ Acute Relief OT [facility] Office: [phone]   Zenovia Jarred 08/23/2018, 4:38 PM

## 2018-08-23 NOTE — Progress Notes (Signed)
PROGRESS NOTE  [name] QXI:[id number] DOB: [birth year] DOA: 08/21/2018 PCP: [name], PA  HPI/Recap of past 24 hours: [name] is a 84 y.o. female with medical history significant for rectal CA status post colostomy, colon CA, COPD/asthma, chronic systolic and diastolic HF, status post ICD, hypertension, hypothyroidism, hyperlipidemia, gout, presents to the ER from her assisted living facility complaining of nausea and vomiting x1 day.  Patient unable to keep any food down.  Denies any significant abdominal pain, fever/chills, shortness of breath, chest pain, diarrhea.  Patient noted slightly decreased output from colostomy.  Denies any other complaints. In the ED, vital signs, labs noted to be somewhat stable.  CT abdomen pelvis showed small bowel obstruction likely secondary to parastomal hernia.  General surgery consulted.  Patient admitted for further management.  Today, pt denies any new complaints, eager to have NGT out. C/O hand pain  Assessment/Plan: Principal Problem:   SBO (small bowel obstruction) (HCC) Active Problems:   Colostomy in place [facility])   COPD (chronic obstructive pulmonary disease) (HCC)   Dementia (HCC)   CKD (chronic kidney disease) stage 3, GFR 30-59 ml/min (HCC)   Rectal cancer ypTypN0 (0/13) s/p chemoXRT/ APR/colostomy 2006   Pacemaker   Recurrent parastomal hernia at [facility] colostomy  SBO Currently afebrile, no leukocytosis CT abdomen pelvis showed small bowel obstruction likely secondary to parastomal hernia General surgery consulted: SBO protocol Advanced to CLD, plan to remove NG tube D/C IVF IV antiemetics Monitor closely  CKD stage III Creatinine baseline around 1.7-2, currently at baseline Avoid nephrotoxic's BMP  HTN Restart home coreg, continue to hold hydralazine, imdur till adequate intake and as per BP PRN IV hydralazine as pt is npo  Anemia of CKD Hemoglobin at baseline Held iron supplementation CBC  Chronic systolic  and diastolic HF s/p AICD Appears compensated Last echo on 04/2017 showed EF of 40 to 45%, moderate diffuse hypokinesis, PA peak pressure of 65 mmHg Hold home Lasix for now, hold imdur  COPD/asthma Stable Chest x-ray showed hypoinflation of both lungs Continue albuterol, Dulera inhaler  Hyperlipidemia Hold home statin  Hypothyroidism Switch to IV Synthroid  GERD Hold home Zantac IV pantoprazole  Gout No longer on colchicine Restart home Flexeril    Code Status: Full  Family Communication: None at bedside  Disposition Plan: SNF Vs ALF   Consultants:  General surgery  Procedures:  None  Antimicrobials:  None  DVT prophylaxis: Heparin   Objective: Vitals:   08/22/18 2144 08/23/18 0529 08/23/18 1101 08/23/18 1320  BP:  (!) 133/58  (!) 157/64  Pulse:  88  99  Resp:  20  17  Temp:  97.6 F (36.4 C)  98.6 F (37 C)  TempSrc:  Axillary  Oral  SpO2: 100% 100% 100% 98%  Weight:      Height:        Intake/Output Summary (Last 24 hours) at 08/23/2018 1834 Last data filed at 08/23/2018 1640 Gross per 24 hour  Intake 1455.86 ml  Output 1150 ml  Net 305.86 ml   Filed Weights   08/21/18 1219  Weight: 96.6 kg    Exam:  General: NAD   Cardiovascular: S1, S2 present  Respiratory: CTAB  Abdomen: Soft, nontender, nondistended, bowel sounds present, colostomy present, NG tube in place  Musculoskeletal: No bilateral pedal edema noted  Skin: Normal  Psychiatry: Normal mood   Data Reviewed: CBC: Recent Labs  Lab 08/21/18 1425 08/22/18 0350  WBC 9.5 7.3  NEUTROABS 7.2  --  HGB 9.2* 9.3*  HCT 31.5* 32.2*  MCV 93.2 93.1  PLT 307 973   Basic Metabolic Panel: Recent Labs  Lab 08/21/18 1425 08/22/18 0350  NA 141 142  K 5.0 4.6  CL 101 104  CO2 32 26  GLUCOSE 99 94  BUN 45* 42*  CREATININE 1.83* 1.83*  CALCIUM 9.7 9.3   GFR: Estimated Creatinine Clearance: 27.8 mL/min (A) (by C-G formula based on SCr of 1.83 mg/dL  (H)). Liver Function Tests: Recent Labs  Lab 08/21/18 1425  AST 19  ALT 24  ALKPHOS 81  BILITOT 0.2*  PROT 8.6*  ALBUMIN 3.6   Recent Labs  Lab 08/21/18 1425  LIPASE 24   No results for input(s): AMMONIA in the last 168 hours. Coagulation Profile: No results for input(s): INR, PROTIME in the last 168 hours. Cardiac Enzymes: No results for input(s): CKTOTAL, CKMB, CKMBINDEX, TROPONINI in the last 168 hours. BNP (last 3 results) No results for input(s): PROBNP in the last 8760 hours. HbA1C: No results for input(s): HGBA1C in the last 72 hours. CBG: No results for input(s): GLUCAP in the last 168 hours. Lipid Profile: No results for input(s): CHOL, HDL, LDLCALC, TRIG, CHOLHDL, LDLDIRECT in the last 72 hours. Thyroid Function Tests: No results for input(s): TSH, T4TOTAL, FREET4, T3FREE, THYROIDAB in the last 72 hours. Anemia Panel: No results for input(s): VITAMINB12, FOLATE, FERRITIN, TIBC, IRON, RETICCTPCT in the last 72 hours. Urine analysis:    Component Value Date/Time   COLORURINE YELLOW 08/21/2018 2124   APPEARANCEUR CLEAR 08/21/2018 2124   LABSPEC 1.013 08/21/2018 2124   PHURINE 5.0 08/21/2018 2124   GLUCOSEU NEGATIVE 08/21/2018 2124   HGBUR NEGATIVE 08/21/2018 2124   BILIRUBINUR NEGATIVE 08/21/2018 2124   KETONESUR NEGATIVE 08/21/2018 2124   PROTEINUR NEGATIVE 08/21/2018 2124   UROBILINOGEN 0.2 07/23/2015 1610   NITRITE NEGATIVE 08/21/2018 2124   LEUKOCYTESUR LARGE (A) 08/21/2018 2124   Sepsis Labs: @LABRCNTIP (procalcitonin:4,lacticidven:4)  ) Recent Results (from the past 240 hour(s))  MRSA PCR Screening     Status: None   Collection Time: 08/21/18 10:51 PM  Result Value Ref Range Status   MRSA by PCR NEGATIVE NEGATIVE Final    Comment:        The GeneXpert MRSA Assay (FDA approved for NASAL specimens only), is one component of a comprehensive MRSA colonization surveillance program. It is not intended to diagnose MRSA infection nor to guide  or monitor treatment for MRSA infections. Performed at [facility], [place] [address] [place], [place] [address]       Studies: Dg Abd Portable 1v  Result Date: 08/23/2018 CLINICAL DATA:  Small-bowel obstruction. EXAM: PORTABLE ABDOMEN - 1 VIEW COMPARISON:  08/22/2018.  CT 08/21/2018. FINDINGS: NG tube noted with tip over the stomach. Cardiac pacer noted. Surgical clips noted over the left lower abdomen. No bowel distention. Stool noted throughout the colon. Contrast in the colon. Pelvic calcifications consistent phleboliths. IMPRESSION: NG tube noted with tip over the stomach. No bowel distention. Contrast in the colon. Electronically Signed   By: [place]   On: 08/23/2018 08:25    Scheduled Meds: . feeding supplement (PRO-STAT SUGAR FREE 64)  30 mL Oral BID  . heparin  5,000 Units Subcutaneous Q8H  . levothyroxine  25 mcg Intravenous Daily  . lip balm  1 application Topical BID  . mometasone-formoterol  2 puff Inhalation BID  . pantoprazole (PROTONIX) IV  40 mg Intravenous Q24H  . polyvinyl alcohol  1 drop Both Eyes TID  .  sodium chloride flush  3 mL Intravenous Q12H    Continuous Infusions: . sodium chloride 10 mL/hr at 08/23/18 1832     LOS: 2 days     Alma Friendly, MD [facility]   If 7PM-7AM, please contact night-coverage [email or web address] 08/23/2018, 6:34 PM

## 2018-08-23 NOTE — Clinical Social Work Note (Signed)
Clinical Social Work Assessment  Patient Details  Name: [name] MRN: [id number] Date of Birth: Jun 04, 1934  Date of referral:  08/23/18               Reason for consult:  Discharge Planning                Permission sought to share information with:  Family Supports, Customer service manager Permission granted to share information::  Yes, Verbal Permission Granted  Name::        Agency::  [facility]   Relationship::  Son  Sport and exercise psychologist Information:     Housing/Transportation Living arrangements for the past 2 months:  [place] of Information:  Patient, Adult Children Patient Interpreter Needed:  None Criminal Activity/Legal Involvement Pertinent to Current Situation/Hospitalization:  No - Comment as needed Significant Relationships:  Adult Children Lives with:  Facility Resident Do you feel safe going back to the place where you live?  Yes Need for family participation in patient care:  Yes (Comment)  Care giving concerns:   Presented to the ER from her assisted living facility complaining of nausea and vomiting x1 day.  Patient unable to keep any food down. H/O CHF, colostomy  Social Worker assessment / plan: Patient uses a rolling walker at baseline and is now requiring two plus assist to stand.  CSW discussed discharge plan to SNF with patient and her son. Patient has been to [facility] for rehab in the past and feels rehab went well there.   CSW completed FL2 and initiated the insurance authorization.   FL2 done.  Plan:SNF   Employment status:  Retired Nurse, adult PT Recommendations:  [place] / Referral to community resources:  [place]  Patient/Family's Response to care:  Agreeable and Responding well to care.   Patient/Family's Understanding of and Emotional Response to Diagnosis, Current Treatment, and Prognosis:  Patient has dementia but reports she is" ready  to eat something." Patient son very involved in patient care and has a good understanding of the patient diagnosis and current treatment.   Emotional Assessment Appearance:  Appears stated age Attitude/Demeanor/Rapport:    Affect (typically observed):  Accepting, Calm, Pleasant Orientation:  Oriented to Self, Oriented to Place, Oriented to  Time, Oriented to Situation Alcohol / Substance use:  Not Applicable Psych involvement (Current and /or in the community):  No (Comment)  Discharge Needs  Concerns to be addressed:  Discharge Planning Concerns Readmission within the last 30 days:  No Current discharge risk:  Dependent with Mobility Barriers to Discharge:  Continued Medical Work up, [place], LCSW 08/23/2018, 12:51 PM

## 2018-08-24 LAB — CBC WITH DIFFERENTIAL/PLATELET
ABS IMMATURE GRANULOCYTES: 0.05 10*3/uL (ref 0.00–0.07)
BASOS ABS: 0 10*3/uL (ref 0.0–0.1)
Basophils Relative: 0 %
EOS ABS: 0 10*3/uL (ref 0.0–0.5)
Eosinophils Relative: 0 %
HEMATOCRIT: 28.7 % — AB (ref 36.0–46.0)
Hemoglobin: 8.4 g/dL — ABNORMAL LOW (ref 12.0–15.0)
Immature Granulocytes: 1 %
LYMPHS ABS: 1.5 10*3/uL (ref 0.7–4.0)
Lymphocytes Relative: 17 %
MCH: 26.9 pg (ref 26.0–34.0)
MCHC: 29.3 g/dL — ABNORMAL LOW (ref 30.0–36.0)
MCV: 92 fL (ref 80.0–100.0)
MONO ABS: 0.8 10*3/uL (ref 0.1–1.0)
MONOS PCT: 9 %
NEUTROS PCT: 73 %
Neutro Abs: 6.5 10*3/uL (ref 1.7–7.7)
Platelets: 330 10*3/uL (ref 150–400)
RBC: 3.12 MIL/uL — ABNORMAL LOW (ref 3.87–5.11)
RDW: 14.8 % (ref 11.5–15.5)
WBC: 8.9 10*3/uL (ref 4.0–10.5)
nRBC: 0 % (ref 0.0–0.2)

## 2018-08-24 LAB — BASIC METABOLIC PANEL
Anion gap: 11 (ref 5–15)
BUN: 29 mg/dL — ABNORMAL HIGH (ref 8–23)
CALCIUM: 9.2 mg/dL (ref 8.9–10.3)
CO2: 25 mmol/L (ref 22–32)
Chloride: 110 mmol/L (ref 98–111)
Creatinine, Ser: 1.16 mg/dL — ABNORMAL HIGH (ref 0.44–1.00)
GFR, EST AFRICAN AMERICAN: 49 mL/min — AB (ref >60)
GFR, EST NON AFRICAN AMERICAN: 43 mL/min — AB (ref >60)
Glucose, Bld: 99 mg/dL (ref 70–99)
POTASSIUM: 3.8 mmol/L (ref 3.5–5.1)
Sodium: 146 mmol/L — ABNORMAL HIGH (ref 135–145)

## 2018-08-24 NOTE — Progress Notes (Signed)
PROGRESS NOTE  [name] RKY:[id number] DOB: Jul 20, 1934 DOA: 08/21/2018 PCP: [name], PA  HPI/Recap of past 24 hours: [name] is a 84 y.o. female with medical history significant for rectal CA status post colostomy, colon CA, COPD/asthma, chronic systolic and diastolic HF, status post ICD, hypertension, hypothyroidism, hyperlipidemia, gout, presents to the ER from her assisted living facility complaining of nausea and vomiting x1 day.  Patient unable to keep any food down.  Denies any significant abdominal pain, fever/chills, shortness of breath, chest pain, diarrhea.  Patient noted slightly decreased output from colostomy.  Denies any other complaints. In the ED, vital signs, labs noted to be somewhat stable.  CT abdomen pelvis showed small bowel obstruction likely secondary to parastomal hernia.  General surgery consulted.  Patient admitted for further management.  Today, met pt eating breakfast, reports pain in L hand where an IV was previously placed. Denies any new complaints.  Assessment/Plan: Principal Problem:   SBO (small bowel obstruction) (HCC) Active Problems:   Colostomy in place [facility])   COPD (chronic obstructive pulmonary disease) (HCC)   Dementia (HCC)   CKD (chronic kidney disease) stage 3, GFR 30-59 ml/min (HCC)   Rectal cancer ypTypN0 (0/13) s/p chemoXRT/ APR/colostomy 2006   Pacemaker   Recurrent parastomal hernia at [facility] colostomy  SBO Currently afebrile, no leukocytosis CT abdomen pelvis showed small bowel obstruction likely secondary to parastomal hernia General surgery consulted: SBO protocol Advanced to FLD D/C IVF, NGT IV antiemetics Monitor closely  CKD stage III Creatinine baseline around 1.7-2, currently at baseline Avoid nephrotoxic's BMP  HTN Restart home coreg, continue to hold hydralazine, imdur till adequate intake and as per BP PRN IV hydralazine  Anemia of CKD Hemoglobin at baseline Held iron  supplementation CBC  Chronic systolic and diastolic HF s/p AICD Appears compensated Last echo on 04/2017 showed EF of 40 to 45%, moderate diffuse hypokinesis, PA peak pressure of 65 mmHg Hold home Lasix for now, hold imdur  COPD/asthma Stable Chest x-ray showed hypoinflation of both lungs Continue albuterol, Dulera inhaler  Hyperlipidemia Hold home statin  Hypothyroidism Switch to IV Synthroid  GERD Hold home Zantac IV pantoprazole  Gout No longer on colchicine Restart home Flexeril    Code Status: Full  Family Communication: None at bedside  Disposition Plan: SNF Vs ALF   Consultants:  General surgery  Procedures:  None  Antimicrobials:  None  DVT prophylaxis: Heparin   Objective: Vitals:   08/23/18 2229 08/24/18 0603 08/24/18 0908 08/24/18 1425  BP: 132/64 (!) 142/63  (!) 151/63  Pulse: 86 99  87  Resp: '20 20  18  ' Temp: 99 F (37.2 C) 99.5 F (37.5 C)    TempSrc: Oral Oral  Oral  SpO2: 98% 97% 98% 96%  Weight:      Height:        Intake/Output Summary (Last 24 hours) at 08/24/2018 1733 Last data filed at 08/24/2018 1426 Gross per 24 hour  Intake 1514.67 ml  Output 750 ml  Net 764.67 ml   Filed Weights   08/21/18 1219  Weight: 96.6 kg    Exam:  General: NAD   Cardiovascular: S1, S2 present  Respiratory: CTAB  Abdomen: Soft, nontender, nondistended, bowel sounds present, colostomy present  Musculoskeletal: No bilateral pedal edema noted  Skin: Normal  Psychiatry: Normal mood   Data Reviewed: CBC: Recent Labs  Lab 08/21/18 1425 08/22/18 0350 08/24/18 0345  WBC 9.5 7.3 8.9  NEUTROABS 7.2  --  6.5  HGB  9.2* 9.3* 8.4*  HCT 31.5* 32.2* 28.7*  MCV 93.2 93.1 92.0  PLT 307 307 094   Basic Metabolic Panel: Recent Labs  Lab 08/21/18 1425 08/22/18 0350 08/24/18 0345  NA 141 142 146*  K 5.0 4.6 3.8  CL 101 104 110  CO2 32 26 25  GLUCOSE 99 94 99  BUN 45* 42* 29*  CREATININE 1.83* 1.83* 1.16*  CALCIUM  9.7 9.3 9.2   GFR: Estimated Creatinine Clearance: 43.8 mL/min (A) (by C-G formula based on SCr of 1.16 mg/dL (H)). Liver Function Tests: Recent Labs  Lab 08/21/18 1425  AST 19  ALT 24  ALKPHOS 81  BILITOT 0.2*  PROT 8.6*  ALBUMIN 3.6   Recent Labs  Lab 08/21/18 1425  LIPASE 24   No results for input(s): AMMONIA in the last 168 hours. Coagulation Profile: No results for input(s): INR, PROTIME in the last 168 hours. Cardiac Enzymes: No results for input(s): CKTOTAL, CKMB, CKMBINDEX, TROPONINI in the last 168 hours. BNP (last 3 results) No results for input(s): PROBNP in the last 8760 hours. HbA1C: No results for input(s): HGBA1C in the last 72 hours. CBG: No results for input(s): GLUCAP in the last 168 hours. Lipid Profile: No results for input(s): CHOL, HDL, LDLCALC, TRIG, CHOLHDL, LDLDIRECT in the last 72 hours. Thyroid Function Tests: No results for input(s): TSH, T4TOTAL, FREET4, T3FREE, THYROIDAB in the last 72 hours. Anemia Panel: No results for input(s): VITAMINB12, FOLATE, FERRITIN, TIBC, IRON, RETICCTPCT in the last 72 hours. Urine analysis:    Component Value Date/Time   COLORURINE YELLOW 08/21/2018 2124   APPEARANCEUR CLEAR 08/21/2018 2124   LABSPEC 1.013 08/21/2018 2124   PHURINE 5.0 08/21/2018 2124   GLUCOSEU NEGATIVE 08/21/2018 2124   HGBUR NEGATIVE 08/21/2018 2124   BILIRUBINUR NEGATIVE 08/21/2018 2124   KETONESUR NEGATIVE 08/21/2018 2124   PROTEINUR NEGATIVE 08/21/2018 2124   UROBILINOGEN 0.2 07/23/2015 1610   NITRITE NEGATIVE 08/21/2018 2124   LEUKOCYTESUR LARGE (A) 08/21/2018 2124   Sepsis Labs: '@LABRCNTIP' (procalcitonin:4,lacticidven:4)  ) Recent Results (from the past 240 hour(s))  MRSA PCR Screening     Status: None   Collection Time: 08/21/18 10:51 PM  Result Value Ref Range Status   MRSA by PCR NEGATIVE NEGATIVE Final    Comment:        The GeneXpert MRSA Assay (FDA approved for NASAL specimens only), is one component of  a comprehensive MRSA colonization surveillance program. It is not intended to diagnose MRSA infection nor to guide or monitor treatment for MRSA infections. Performed at [facility], [place] [address], [place], [place] [address]       Studies: No results found.  Scheduled Meds: . carvedilol  6.25 mg Oral BID WC  . cyclobenzaprine  5 mg Oral QHS  . feeding supplement (PRO-STAT SUGAR FREE 64)  30 mL Oral BID  . heparin  5,000 Units Subcutaneous Q8H  . levothyroxine  25 mcg Intravenous Daily  . lip balm  1 application Topical BID  . mometasone-formoterol  2 puff Inhalation BID  . pantoprazole (PROTONIX) IV  40 mg Intravenous Q24H  . polyvinyl alcohol  1 drop Both Eyes TID  . sodium chloride flush  3 mL Intravenous Q12H    Continuous Infusions: . sodium chloride 10 mL/hr at 08/23/18 1832     LOS: 3 days     Alma Friendly, MD [facility]   If 7PM-7AM, please contact night-coverage [email or web address] 08/24/2018, 5:33 PM

## 2018-08-24 NOTE — Progress Notes (Signed)
   Subjective/Chief Complaint: Complains of pain in fingers   Objective: Vital signs in last 24 hours: Temp:  [98.6 F (37 C)-99.5 F (37.5 C)] 99.5 F (37.5 C) (11/16 0603) Pulse Rate:  [86-99] 99 (11/16 0603) Resp:  [17-20] 20 (11/16 0603) BP: (132-157)/(63-71) 142/63 (11/16 0603) SpO2:  [97 %-100 %] 97 % (11/16 0603) Last BM Date: 08/23/18(ostomy)  Intake/Output from previous day: 11/15 0701 - 11/16 0700 In: 1718.2 [P.O.:1180; I.V.:538.2] Out: 800 [Urine:700; Stool:100] Intake/Output this shift: No intake/output data recorded.  General appearance: alert and cooperative Resp: clear to auscultation bilaterally Cardio: regular rate and rhythm GI: soft, nontender. stool in bag  Lab Results:  Recent Labs    08/22/18 0350 08/24/18 0345  WBC 7.3 8.9  HGB 9.3* 8.4*  HCT 32.2* 28.7*  PLT 307 330   BMET Recent Labs    08/22/18 0350 08/24/18 0345  NA 142 146*  K 4.6 3.8  CL 104 110  CO2 26 25  GLUCOSE 94 99  BUN 42* 29*  CREATININE 1.83* 1.16*  CALCIUM 9.3 9.2   PT/INR No results for input(s): LABPROT, INR in the last 72 hours. ABG No results for input(s): PHART, HCO3 in the last 72 hours.  Invalid input(s): PCO2, PO2  Studies/Results: Dg Abd Portable 1v  Result Date: 08/23/2018 CLINICAL DATA:  Small-bowel obstruction. EXAM: PORTABLE ABDOMEN - 1 VIEW COMPARISON:  08/22/2018.  CT 08/21/2018. FINDINGS: NG tube noted with tip over the stomach. Cardiac pacer noted. Surgical clips noted over the left lower abdomen. No bowel distention. Stool noted throughout the colon. Contrast in the colon. Pelvic calcifications consistent phleboliths. IMPRESSION: NG tube noted with tip over the stomach. No bowel distention. Contrast in the colon. Electronically Signed   By: Marcello Moores  Register   On: 08/23/2018 08:25    Anti-infectives: Anti-infectives (From admission, onward)   None      Assessment/Plan: s/p * No surgery found * Advance diet. sbo improving  LOS: 3 days     Autumn Messing III 08/24/2018

## 2018-08-25 MED ORDER — POLYSACCHARIDE IRON COMPLEX 150 MG PO CAPS
150.0000 mg | ORAL_CAPSULE | Freq: Every day | ORAL | Status: DC
  Administered 2018-08-25 – 2018-08-26 (×2): 150 mg via ORAL
  Filled 2018-08-25 (×2): qty 1

## 2018-08-25 MED ORDER — ISOSORBIDE MONONITRATE ER 30 MG PO TB24
30.0000 mg | ORAL_TABLET | Freq: Every day | ORAL | Status: DC
  Administered 2018-08-25 – 2018-08-26 (×2): 30 mg via ORAL
  Filled 2018-08-25 (×2): qty 1

## 2018-08-25 MED ORDER — LEVOTHYROXINE SODIUM 50 MCG PO TABS
50.0000 ug | ORAL_TABLET | Freq: Every day | ORAL | Status: DC
  Administered 2018-08-26: 50 ug via ORAL
  Filled 2018-08-25 (×2): qty 1

## 2018-08-25 MED ORDER — FUROSEMIDE 40 MG PO TABS
40.0000 mg | ORAL_TABLET | Freq: Every day | ORAL | Status: DC
  Administered 2018-08-25: 40 mg via ORAL
  Filled 2018-08-25: qty 1

## 2018-08-25 MED ORDER — MONTELUKAST SODIUM 10 MG PO TABS
10.0000 mg | ORAL_TABLET | Freq: Every day | ORAL | Status: DC
  Administered 2018-08-25 – 2018-08-26 (×2): 10 mg via ORAL
  Filled 2018-08-25 (×2): qty 1

## 2018-08-25 MED ORDER — LEVOTHYROXINE SODIUM 50 MCG PO TABS: 50.0000 ug | ORAL_TABLET | Freq: Every day | ORAL | Status: DC

## 2018-08-25 NOTE — Progress Notes (Signed)
Pt sat up in the chair for several hours, tolerated well.

## 2018-08-25 NOTE — Evaluation (Signed)
Clinical/Bedside Swallow Evaluation Patient Details  Name: [name] MRN: [id number] Date of Birth: [birth year]  Today's Date: 08/25/2018 Time: SLP Start Time (ACUTE ONLY): 1601 SLP Stop Time (ACUTE ONLY): 1419 SLP Time Calculation (min) (ACUTE ONLY): 15 min  Past Medical History:  Past Medical History:  Diagnosis Date  . Anginal pain ([place])   . Arthritis    "comes and goes" (03/01/2015)  . Asthma   . CHF (congestive heart failure) ([place])   . Colon cancer ([place])   . Colostomy care ([place])   . COPD (chronic obstructive pulmonary disease) ([place])   . Diabetes mellitus without complication ([place])    pt denies this hx on 03/01/2015  . HCAP (healthcare-associated pneumonia) 04/22/2017  . Heart murmur   . History of blood transfusion 03/01/2015; 04/23/2017   "related to anemia,"  . Hyperlipidemia   . Hypertension   . Hypothyroidism   . Pneumonia    "just once" (03/01/2015)  . Pneumonia 04/23/2017  . Presence of combination internal cardiac defibrillator (ICD) and pacemaker    2005 in [facility]  . Pulmonary embolism ([place])   . Renal disorder    Past Surgical History:  Past Surgical History:  Procedure Laterality Date  . ABDOMINAL HYSTERECTOMY    . ABDOMINOPERINEAL PROCTOCOLECTOMY  2006   Rectal cancer  . ANKLE FRACTURE SURGERY Left   . CARDIAC CATHETERIZATION N/A 03/22/2015   Procedure: Right/Left Heart Cath and Coronary Angiography;  Surgeon: Leonie Man, MD;  Location: [facility];  Service: Cardiovascular;  Laterality: N/A;  . CATARACT EXTRACTION W/ INTRAOCULAR LENS  IMPLANT, BILATERAL Bilateral   . CHOLECYSTECTOMY    . DILATION AND CURETTAGE OF UTERUS    . FRACTURE SURGERY    . PARASTOMAL HERNIA REPAIR  2007   lysis of adhesions  . TUBAL LIGATION     HPI:  84 y.o. female with medical history significant for rectal CA status post colostomy, colon CA, COPD/asthma, dementia, chronic systolic and diastolic HF, status post ICD, hypertension, hypothyroidism, hyperlipidemia,  gout, presents to the ER from her assisted living facility complaining of nausea and vomiting x1 day.  Patient unable to keep any food down.  CT abdomen pelvis showed small bowel obstruction likely secondary to parastomal hernia. MBS 11/2017-mild oropharyngeal dysphagia with minimal flash penetration, mild oral dysphagia. Recommended dysphagia 3 with thin liquids.    Assessment / Plan / Recommendation Clinical Impression  Swallowing function appearing consistent with most recent MBS results in which patient present with mild mastication delays but an otherwise functional appearing oropharyngeal swallow. No overt indication of aspiration observed. No SLP f/u indicated at this time. Please re-consult if needed.  SLP Visit Diagnosis: Dysphagia, unspecified (R13.10)    Aspiration Risk  Mild aspiration risk    Diet Recommendation Regular;Thin liquid   Liquid Administration via: Cup;Straw Medication Administration: Whole meds with liquid Supervision: Patient able to self feed;Intermittent supervision to cue for compensatory strategies Compensations: Slow rate;Small sips/bites Postural Changes: Seated upright at 90 degrees    Other  Recommendations Oral Care Recommendations: Oral care BID     Swallow Study   General HPI: 84 y.o. female with medical history significant for rectal CA status post colostomy, colon CA, COPD/asthma, dementia, chronic systolic and diastolic HF, status post ICD, hypertension, hypothyroidism, hyperlipidemia, gout, presents to the ER from her assisted living facility complaining of nausea and vomiting x1 day.  Patient unable to keep any food down.  CT abdomen pelvis showed small bowel obstruction likely secondary to parastomal hernia.  MBS 11/2017-mild oropharyngeal dysphagia with minimal flash penetration, mild oral dysphagia. Recommended dysphagia 3 with thin liquids.  Type of Study: Bedside Swallow Evaluation Previous Swallow Assessment: see HPI Diet Prior to this Study:  Thin liquids;Regular Temperature Spikes Noted: Yes Respiratory Status: Room air History of Recent Intubation: No Behavior/Cognition: Pleasant mood;Cooperative;Alert Oral Cavity Assessment: Within Functional Limits Oral Care Completed by SLP: Recent completion by staff Oral Cavity - Dentition: Dentures, top;Dentures, bottom Vision: Functional for self-feeding Self-Feeding Abilities: Able to feed self Patient Positioning: Upright in chair Baseline Vocal Quality: Normal Volitional Cough: Strong Volitional Swallow: Able to elicit    Oral/Motor/Sensory Function Overall Oral Motor/Sensory Function: Within functional limits   Ice Chips Ice chips: Not tested   Thin Liquid Thin Liquid: Within functional limits Presentation: Straw;Self Fed    Nectar Thick Nectar Thick Liquid: Not tested   Honey Thick Honey Thick Liquid: Not tested   Puree Puree: Within functional limits Presentation: Spoon   Solid     Solid: Within functional limits Presentation: Kerrick MA, CCC-SLP   Kaydince Towles Meryl 08/25/2018,2:20 PM

## 2018-08-25 NOTE — Progress Notes (Signed)
   Subjective/Chief Complaint: Complains of finger pain   Objective: Vital signs in last 24 hours: Temp:  [99.3 F (37.4 C)-100 F (37.8 C)] 100 F (37.8 C) (11/17 0537) Pulse Rate:  [87-89] 88 (11/17 0537) Resp:  [16-18] 16 (11/17 0537) BP: (144-154)/(63-72) 154/72 (11/17 0537) SpO2:  [91 %-98 %] 91 % (11/17 0537) Last BM Date: 08/24/18  Intake/Output from previous day: 11/16 0701 - 11/17 0700 In: 1209 [P.O.:1020; I.V.:189] Out: 710 [Urine:400; Stool:310] Intake/Output this shift: No intake/output data recorded.  General appearance: alert and cooperative Resp: clear to auscultation bilaterally Cardio: regular rate and rhythm GI: soft, nontender. ostomy productive  Lab Results:  Recent Labs    08/24/18 0345  WBC 8.9  HGB 8.4*  HCT 28.7*  PLT 330   BMET Recent Labs    08/24/18 0345  NA 146*  K 3.8  CL 110  CO2 25  GLUCOSE 99  BUN 29*  CREATININE 1.16*  CALCIUM 9.2   PT/INR No results for input(s): LABPROT, INR in the last 72 hours. ABG No results for input(s): PHART, HCO3 in the last 72 hours.  Invalid input(s): PCO2, PO2  Studies/Results: No results found.  Anti-infectives: Anti-infectives (From admission, onward)   None      Assessment/Plan: s/p * No surgery found * Advance diet  sbo seems to have resolved.  Will sign off  LOS: 4 days    Autumn Messing III 08/25/2018

## 2018-08-25 NOTE — Progress Notes (Signed)
PROGRESS NOTE  [name] DXI:[id number] DOB: Sep 08, 1934 DOA: 08/21/2018 PCP: [name], PA  HPI/Recap of past 24 hours: [name] is a 84 y.o. female with medical history significant for rectal CA status post colostomy, colon CA, COPD/asthma, chronic systolic and diastolic HF, status post ICD, hypertension, hypothyroidism, hyperlipidemia, gout, presents to the ER from her assisted living facility complaining of nausea and vomiting x1 day.  Patient unable to keep any food down.  Denies any significant abdominal pain, fever/chills, shortness of breath, chest pain, diarrhea.  Patient noted slightly decreased output from colostomy.  Denies any other complaints. In the ED, vital signs, labs noted to be somewhat stable.  CT abdomen pelvis showed small bowel obstruction likely secondary to parastomal hernia.  General surgery consulted.  Patient admitted for further management.  Today, met pt eating breakfast, still with pain in L hand. Denies any new complaints.  Assessment/Plan: Principal Problem:   SBO (small bowel obstruction) (HCC) Active Problems:   Colostomy in place [facility])   COPD (chronic obstructive pulmonary disease) (HCC)   Dementia (HCC)   CKD (chronic kidney disease) stage 3, GFR 30-59 ml/min (HCC)   Rectal cancer ypTypN0 (0/13) s/p chemoXRT/ APR/colostomy 2006   Pacemaker   Recurrent parastomal hernia at [facility] colostomy  SBO Currently afebrile, no leukocytosis CT abdomen pelvis showed small bowel obstruction likely secondary to parastomal hernia General surgery consulted: SBO protocol, signed off Advanced diet D/C IVF, NGT IV antiemetics Monitor closely  CKD stage III Creatinine baseline around 1.7-2, currently at baseline Avoid nephrotoxic's BMP  HTN Restart home coreg, imdur, continue to hold hydralazine PRN IV hydralazine  Anemia of CKD Hemoglobin at baseline Restart iron supplementation CBC  Chronic systolic and diastolic HF s/p AICD Appears  compensated Last echo on 04/2017 showed EF of 40 to 45%, moderate diffuse hypokinesis, PA peak pressure of 65 mmHg Restart home Lasix, imdur  COPD/asthma Stable Chest x-ray showed hypoinflation of both lungs Continue albuterol, Dulera inhaler  Hyperlipidemia Hold home statin  Hypothyroidism Continue synthroid  GERD Hold home Zantac IV pantoprazole  Gout No longer on colchicine Restart home Flexeril    Code Status: Full  Family Communication: None at bedside  Disposition Plan: SNF   Consultants:  General surgery  Procedures:  None  Antimicrobials:  None  DVT prophylaxis: Heparin   Objective: Vitals:   08/24/18 2038 08/24/18 2050 08/25/18 0537 08/25/18 1142  BP:  (!) 144/63 (!) 154/72   Pulse:  89 88   Resp:  18 16   Temp:  99.3 F (37.4 C) 100 F (37.8 C)   TempSrc:  Oral Oral   SpO2: 98% 96% 91% 95%  Weight:      Height:        Intake/Output Summary (Last 24 hours) at 08/25/2018 1356 Last data filed at 08/25/2018 1133 Gross per 24 hour  Intake 1385.5 ml  Output 1010 ml  Net 375.5 ml   Filed Weights   08/21/18 1219  Weight: 96.6 kg    Exam:  General: NAD   Cardiovascular: S1, S2 present  Respiratory: CTAB  Abdomen: Soft, nontender, nondistended, bowel sounds present, colostomy with output  Musculoskeletal: No bilateral pedal edema noted  Skin: Normal  Psychiatry: Normal mood   Data Reviewed: CBC: Recent Labs  Lab 08/21/18 1425 08/22/18 0350 08/24/18 0345  WBC 9.5 7.3 8.9  NEUTROABS 7.2  --  6.5  HGB 9.2* 9.3* 8.4*  HCT 31.5* 32.2* 28.7*  MCV 93.2 93.1 92.0  PLT 307 307  939   Basic Metabolic Panel: Recent Labs  Lab 08/21/18 1425 08/22/18 0350 08/24/18 0345  NA 141 142 146*  K 5.0 4.6 3.8  CL 101 104 110  CO2 32 26 25  GLUCOSE 99 94 99  BUN 45* 42* 29*  CREATININE 1.83* 1.83* 1.16*  CALCIUM 9.7 9.3 9.2   GFR: Estimated Creatinine Clearance: 43.8 mL/min (A) (by C-G formula based on SCr of 1.16  mg/dL (H)). Liver Function Tests: Recent Labs  Lab 08/21/18 1425  AST 19  ALT 24  ALKPHOS 81  BILITOT 0.2*  PROT 8.6*  ALBUMIN 3.6   Recent Labs  Lab 08/21/18 1425  LIPASE 24   No results for input(s): AMMONIA in the last 168 hours. Coagulation Profile: No results for input(s): INR, PROTIME in the last 168 hours. Cardiac Enzymes: No results for input(s): CKTOTAL, CKMB, CKMBINDEX, TROPONINI in the last 168 hours. BNP (last 3 results) No results for input(s): PROBNP in the last 8760 hours. HbA1C: No results for input(s): HGBA1C in the last 72 hours. CBG: No results for input(s): GLUCAP in the last 168 hours. Lipid Profile: No results for input(s): CHOL, HDL, LDLCALC, TRIG, CHOLHDL, LDLDIRECT in the last 72 hours. Thyroid Function Tests: No results for input(s): TSH, T4TOTAL, FREET4, T3FREE, THYROIDAB in the last 72 hours. Anemia Panel: No results for input(s): VITAMINB12, FOLATE, FERRITIN, TIBC, IRON, RETICCTPCT in the last 72 hours. Urine analysis:    Component Value Date/Time   COLORURINE YELLOW 08/21/2018 2124   APPEARANCEUR CLEAR 08/21/2018 2124   LABSPEC 1.013 08/21/2018 2124   PHURINE 5.0 08/21/2018 2124   GLUCOSEU NEGATIVE 08/21/2018 2124   HGBUR NEGATIVE 08/21/2018 2124   BILIRUBINUR NEGATIVE 08/21/2018 2124   KETONESUR NEGATIVE 08/21/2018 2124   PROTEINUR NEGATIVE 08/21/2018 2124   UROBILINOGEN 0.2 07/23/2015 1610   NITRITE NEGATIVE 08/21/2018 2124   LEUKOCYTESUR LARGE (A) 08/21/2018 2124   Sepsis Labs: _0 (procalcitonin:4,lacticidven:4)  ) Recent Results (from the past 240 hour(s))  MRSA PCR Screening     Status: None   Collection Time: 08/21/18 10:51 PM  Result Value Ref Range Status   MRSA by PCR NEGATIVE NEGATIVE Final    Comment:        The GeneXpert MRSA Assay (FDA approved for NASAL specimens only), is one component of a comprehensive MRSA colonization surveillance program. It is not intended to diagnose MRSA infection nor to  guide or monitor treatment for MRSA infections. Performed at [facility], [place] [address], [place], [place] [address]       Studies: No results found.  Scheduled Meds: . carvedilol  6.25 mg Oral BID WC  . cyclobenzaprine  5 mg Oral QHS  . feeding supplement (PRO-STAT SUGAR FREE 64)  30 mL Oral BID  . heparin  5,000 Units Subcutaneous Q8H  . levothyroxine  25 mcg Intravenous Daily  . lip balm  1 application Topical BID  . mometasone-formoterol  2 puff Inhalation BID  . pantoprazole (PROTONIX) IV  40 mg Intravenous Q24H  . polyvinyl alcohol  1 drop Both Eyes TID  . sodium chloride flush  3 mL Intravenous Q12H    Continuous Infusions: . sodium chloride 10 mL/hr at 08/25/18 0200     LOS: 4 days     Alma Friendly, MD [facility]   If 7PM-7AM, please contact night-coverage [email or web address] 08/25/2018, 1:56 PM

## 2018-08-26 DIAGNOSIS — F0280 Dementia in other diseases classified elsewhere without behavioral disturbance: Secondary | ICD-10-CM

## 2018-08-26 DIAGNOSIS — G309 Alzheimer's disease, unspecified: Secondary | ICD-10-CM

## 2018-08-26 LAB — BASIC METABOLIC PANEL
Anion gap: 10 (ref 5–15)
BUN: 28 mg/dL — AB (ref 8–23)
CALCIUM: 9.2 mg/dL (ref 8.9–10.3)
CHLORIDE: 104 mmol/L (ref 98–111)
CO2: 28 mmol/L (ref 22–32)
CREATININE: 1.19 mg/dL — AB (ref 0.44–1.00)
GFR calc non Af Amer: 41 mL/min — ABNORMAL LOW (ref >60)
GFR, EST AFRICAN AMERICAN: 48 mL/min — AB (ref >60)
Glucose, Bld: 98 mg/dL (ref 70–99)
Potassium: 4 mmol/L (ref 3.5–5.1)
SODIUM: 142 mmol/L (ref 135–145)

## 2018-08-26 LAB — CBC WITH DIFFERENTIAL/PLATELET
Abs Immature Granulocytes: 0.17 10*3/uL — ABNORMAL HIGH (ref 0.00–0.07)
BASOS ABS: 0 10*3/uL (ref 0.0–0.1)
Basophils Relative: 0 %
EOS ABS: 0.1 10*3/uL (ref 0.0–0.5)
EOS PCT: 1 %
HCT: 27.6 % — ABNORMAL LOW (ref 36.0–46.0)
HEMOGLOBIN: 8.2 g/dL — AB (ref 12.0–15.0)
Immature Granulocytes: 2 %
Lymphocytes Relative: 20 %
Lymphs Abs: 2.3 10*3/uL (ref 0.7–4.0)
MCH: 27.9 pg (ref 26.0–34.0)
MCHC: 29.7 g/dL — AB (ref 30.0–36.0)
MCV: 93.9 fL (ref 80.0–100.0)
MONO ABS: 1.2 10*3/uL — AB (ref 0.1–1.0)
Monocytes Relative: 11 %
NRBC: 0 % (ref 0.0–0.2)
Neutro Abs: 7.4 10*3/uL (ref 1.7–7.7)
Neutrophils Relative %: 66 %
Platelets: 336 10*3/uL (ref 150–400)
RBC: 2.94 MIL/uL — AB (ref 3.87–5.11)
RDW: 15 % (ref 11.5–15.5)
WBC: 11.1 10*3/uL — AB (ref 4.0–10.5)

## 2018-08-26 MED ORDER — FUROSEMIDE 40 MG PO TABS: 40.0000 mg | ORAL_TABLET | Freq: Every day | ORAL | Status: DC

## 2018-08-26 MED ORDER — PANTOPRAZOLE SODIUM 40 MG PO TBEC: 40.0000 mg | DELAYED_RELEASE_TABLET | Freq: Every day | ORAL | Status: DC

## 2018-08-26 MED ORDER — FUROSEMIDE 10 MG/ML IJ SOLN
40.0000 mg | Freq: Once | INTRAMUSCULAR | Status: AC
  Administered 2018-08-26: 40 mg via INTRAVENOUS
  Filled 2018-08-26: qty 4

## 2018-08-26 NOTE — Progress Notes (Signed)
Pt complains of pain in left hand and arm. Pt unable to move left hand or arm.  Pulses strong in left and right radial pulses. Skin is dry and warm on left arm.  Left hand and arm exhibiting non-pitting edema.  Bilateral lower extremities exhibiting non-pitting edema, as well.   Left arm has been elevated and IV in left AC not patient, so it was removed.   Will continue to monitor.

## 2018-08-26 NOTE — Progress Notes (Signed)
Call made to [facility] to give report. Transferred to sixth staff member, placed on hold and call was never answered.  Unable to give report. Donne Hazel, RN

## 2018-08-26 NOTE — Clinical Social Work Placement (Signed)
D/C summary sent.  Nurse given the number to call report PTAR arranged for PTAR. No ETA.   CLINICAL SOCIAL WORK PLACEMENT  NOTE  Date:  08/26/2018  Patient Details  Name: [name] MRN: [id number] Date of Birth: 01-21-1934  Clinical Social Work is seeking post-discharge placement for this patient at the [place] level of care (*CSW will initial, date and re-position this form in  chart as items are completed):  Yes   Patient/family provided with [facility] Work Department's list of facilities offering this level of care within the geographic area requested by the patient (or if unable, by the patient's family).  Yes   Patient/family informed of their freedom to choose among providers that offer the needed level of care, that participate in Medicare, Medicaid or managed care program needed by the patient, have an available bed and are willing to accept the patient.  Yes   Patient/family informed of [facility]'s ownership interest in [facility] and [facility], as well as of the fact that they are under no obligation to receive care at these facilities.  PASRR submitted to EDS on       PASRR number received on       Existing PASRR number confirmed on       FL2 transmitted to all facilities in geographic area requested by pt/family on       FL2 transmitted to all facilities within larger geographic area on 08/22/18     Patient informed that his/her managed care company has contracts with or will negotiate with certain facilities, including the following:  [facility]     Yes   Patient/family informed of bed offers received.  Patient chooses bed at [facility] recommends and patient chooses bed at      Patient to be transferred to [facility] on 08/26/18.  Patient to be transferred to facility by PTAR     Patient family notified on 08/26/18 of transfer.  Name of family  member notified:  Son- [name]     PHYSICIAN Please prepare priority discharge summary, including medications     Additional Comment:    _______________________________________________ Lia Hopping, LCSW 08/26/2018, 3:46 PM

## 2018-08-26 NOTE — Progress Notes (Signed)
Physical Therapy Treatment Patient Details Name: [name] MRN: [id number] DOB: 10-20-1933 Today's Date: 08/26/2018    History of Present Illness 84 yo presented to ED 08/21/18 with N/V, SBO. H/O CHF, colostomy    PT Comments    Pt required + 2 assist to transfer to recliner.  General transfer comment: unable to use a walker due to L UE pain (swelling)   + 2 side by side under arm support assist to stand and complete partial 1/4 pivot to recliner.  B LE's weak, buckling and unable to support her weight.  Poor paoture and B LE weakness.  Reccommend nursing use a lift to assist back to bed.   Follow Up Recommendations  SNF     Equipment Recommendations  None recommended by PT    Recommendations for Other Services       Precautions / Restrictions Precautions Precautions: Fall Precaution Comments: painful left hand(?from IV), colostomy, INCONTINENCE OF URINE Restrictions Weight Bearing Restrictions: No    Mobility  Bed Mobility Overal bed mobility: Needs Assistance Bed Mobility: Rolling;Supine to Sit Rolling: +2 for physical assistance;+2 for safety/equipment;Mod assist;Max assist   Supine to sit: +2 for physical assistance;+2 for safety/equipment;Mod assist;Max assist     General bed mobility comments: assisted to EOB  Transfers Overall transfer level: Needs assistance   Transfers: Sit to/from Stand           General transfer comment: unable to use a walker due to L UE pain (swelling)   + 2 side by side under arm support assist to stand and complete partial 1/4 pivot to recliner.  B LE's weak, buckling and unable to support her weight.  Poor paoture and B LE weakness.  Reccommend nursing use a lift to assist back to bed.  Ambulation/Gait             General Gait Details: unable to attempt due to poor transfer ability   Stairs             Wheelchair Mobility    Modified Rankin (Stroke Patients Only)       Balance                                             Cognition Arousal/Alertness: Awake/alert Behavior During Therapy: WFL for tasks assessed/performed Overall Cognitive Status: Within Functional Limits for tasks assessed                                 General Comments: pleasant and willing but limited physically      Exercises      General Comments        Pertinent Vitals/Pain Pain Assessment: Faces Faces Pain Scale: Hurts little more Pain Location: L UE Pain Descriptors / Indicators: Discomfort;Grimacing Pain Intervention(s): Monitored during session;Repositioned    Home Living                      Prior Function            PT Goals (current goals can now be found in the care plan section) Progress towards PT goals: Progressing toward goals    Frequency    Min 2X/week      PT Plan Current plan remains appropriate    Co-evaluation  AM-PAC PT "6 Clicks" Daily Activity  Outcome Measure  Difficulty turning over in bed (including adjusting bedclothes, sheets and blankets)?: Unable Difficulty moving from lying on back to sitting on the side of the bed? : Unable Difficulty sitting down on and standing up from a chair with arms (e.g., wheelchair, bedside commode, etc,.)?: Unable Help needed moving to and from a bed to chair (including a wheelchair)?: Total Help needed walking in hospital room?: Total Help needed climbing 3-5 steps with a railing? : Total 6 Click Score: 6    End of Session Equipment Utilized During Treatment: Gait belt Activity Tolerance: Patient tolerated treatment well Patient left: in chair;with call bell/phone within reach;with chair alarm set Nurse Communication: Mobility status;Need for lift equipment PT Visit Diagnosis: Unsteadiness on feet (R26.81)     Time: 1100-1130 PT Time Calculation (min) (ACUTE ONLY): 30 min  Charges:  $Therapeutic Activity: 23-37 mins                     Rica Koyanagi  PTA Acute   Rehabilitation Services Pager      [phone] Office      [phone]

## 2018-08-26 NOTE — Discharge Summary (Signed)
Discharge Summary  [name] IZT:[id number] DOB: Jul 08, 1934  PCP: [name], PA  Admit date: 08/21/2018 Discharge date: 08/26/2018  Time spent: 35 mins  Recommendations for Outpatient Follow-up:  1. PCP   Discharge Diagnoses:  Active Hospital Problems   Diagnosis Date Noted  . SBO (small bowel obstruction) ([place]) 08/21/2018  . Recurrent parastomal hernia at [facility] colostomy 08/21/2018  . Rectal cancer ypTypN0 (0/13) s/p chemoXRT/ APR/colostomy 2006 07/27/2015  . CKD (chronic kidney disease) stage 3, GFR 30-59 ml/min (HCC) 07/27/2015  . Dementia ([place]) 07/23/2015  . COPD (chronic obstructive pulmonary disease) ([place]) 04/25/2015  . Colostomy in place [facility]) 03/01/2015  . Pacemaker 11/07/2012    Resolved Hospital Problems  No resolved problems to display.    Discharge Condition: Stable   Diet recommendation: Heart healthy  Vitals:   08/26/18 1042 08/26/18 1355  BP:  129/60  Pulse: 94 88  Resp: 16 20  Temp:  98.2 F (36.8 C)  SpO2: 94% 99%    History of present illness:  Shelby Berhe Hughesis a 84 y.o.femalewith medical history significant for rectal CAstatus post colostomy, colon CA, COPD/asthma,chronic systolic and diastolicHF,statuspost ICD, hypertension, hypothyroidism, hyperlipidemia,gout, presents to the ER from her assisted living facility complaining of nausea and vomiting x1 day.Patient unable to keep any fooddown.Denies any significant abdominal pain, fever/chills, shortness of breath, chest pain, diarrhea. Patient noted slightly decreased output from colostomy. Denies any other complaints. In the ED, vital signs, labs noted to be somewhat stable.CT abdomen pelvis showed small bowel obstruction likely secondary to parastomal hernia.General surgery consulted. Patient admitted for further management.  Today, met pt eating breakfast (hated it), although tolerating diet, no nause/vomiting or abdominal pain, still with pain in L hand where IV site was  placed. Denies any new complaints. General surgery signed off. Pt stable to be discharged to SNF for rehab.  Hospital Course:  Principal Problem:   SBO (small bowel obstruction) (HCC) Active Problems:   Colostomy in place [facility])   COPD (chronic obstructive pulmonary disease) (HCC)   Dementia (HCC)   CKD (chronic kidney disease) stage 3, GFR 30-59 ml/min (HCC)   Rectal cancer ypTypN0 (0/13) s/p chemoXRT/ APR/colostomy 2006   Pacemaker   Recurrent parastomal hernia at [facility] colostomy  SBO Currently afebrile, no leukocytosis CT abdomen pelvis showed small bowel obstruction likely secondary to parastomal hernia General surgery consulted: SBO protocol, signed off Advanced diet, tolerating it  CKD stage III Creatinine baseline around 1.7-2,currently at baseline Avoid nephrotoxic's  HTN Restart home coreg, imdur, hydralazine  Anemia of CKD Hemoglobin at baseline Restart iron supplementation  Chronic systolic and diastolic HFs/p AICD Appears compensated Last echo on 04/2017 showed EF of 40 to 45%, moderate diffuse hypokinesis, PA peak pressure of 65 mmHg Restart home Lasix, imdur  COPD/asthma Stable Chest x-ray showed hypoinflation of both lungs Continue albuterol, Dulera inhaler  Hyperlipidemia Continue statin  Hypothyroidism Continue synthroid  GERD Continue home Zantac  Gout No longer on colchicine Restart home Flexeril     Procedures:  None  Consultations:  General surgery  Discharge Exam: BP 129/60 (BP Location: Right Arm)   Pulse 88   Temp 98.2 F (36.8 C) (Oral)   Resp 20   Ht _0  (1.676 m)   Wt 96.6 kg   SpO2 99%   BMI 34.38 kg/m   General: NAD Cardiovascular: S1, S2 present Respiratory: CTAB  Discharge Instructions You were cared for by a hospitalist during your hospital stay. If you have any questions about your discharge medications  or the care you received while you were in the hospital after you are discharged, you can  call the unit and asked to speak with the hospitalist on call if the hospitalist that took care of you is not available. Once you are discharged, your primary care physician will handle any further medical issues. Please note that NO REFILLS for any discharge medications will be authorized once you are discharged, as it is imperative that you return to your primary care physician (or establish a relationship with a primary care physician if you do not have one) for your aftercare needs so that they can reassess your need for medications and monitor your lab values.   Allergies as of 08/26/2018   No Known Allergies     Medication List    STOP taking these medications   colchicine 0.6 MG tablet   diclofenac sodium 1 % Gel Commonly known as:  VOLTAREN   HYDROcodone-acetaminophen 5-325 MG tablet Commonly known as:  NORCO/VICODIN     TAKE these medications   acetaminophen 500 MG tablet Commonly known as:  TYLENOL Take 1,000 mg by mouth 3 (three) times daily.   albuterol (2.5 MG/3ML) 0.083% nebulizer solution Commonly known as:  PROVENTIL Take 3 mLs (2.5 mg total) by nebulization every 4 (four) hours as needed for wheezing.   aspirin EC 81 MG tablet Take 1 tablet (81 mg total) by mouth every morning.   atorvastatin 20 MG tablet Commonly known as:  LIPITOR Take 20 mg by mouth daily.   carvedilol 6.25 MG tablet Commonly known as:  COREG Take 6.25 mg by mouth 2 (two) times daily with a meal. What changed:  Another medication with the same name was removed. Continue taking this medication, and follow the directions you see here.   cyclobenzaprine 5 MG tablet Commonly known as:  FLEXERIL Take 5 mg by mouth 2 (two) times daily as needed for muscle spasms.   cyclobenzaprine 5 MG tablet Commonly known as:  FLEXERIL Take 5 mg by mouth at bedtime.   FERREX 150 PO Take 150 oz by mouth 2 (two) times daily.   Fluticasone-Salmeterol 500-50 MCG/DOSE Aepb Commonly known as:   ADVAIR Inhale 1 puff into the lungs 2 (two) times daily. Rinse mouth after use   furosemide 20 MG tablet Commonly known as:  LASIX Take 20 mg by mouth daily as needed (weight gain of more >3 lbs in 24 hours). What changed:  Another medication with the same name was changed. Make sure you understand how and when to take each.   furosemide 40 MG tablet Commonly known as:  LASIX Take 1 tablet (40 mg total) by mouth daily. What changed:  when to take this   hydrALAZINE 25 MG tablet Commonly known as:  APRESOLINE Take 0.5 tablets (12.5 mg total) by mouth 3 (three) times daily. What changed:  how much to take   ICY HOT 10-30 % Stck Apply 1 application topically 2 (two) times daily as needed (knee pain.).   isosorbide mononitrate 30 MG 24 hr tablet Commonly known as:  IMDUR Take 1 tablet (30 mg total) by mouth daily.   levothyroxine 50 MCG tablet Commonly known as:  SYNTHROID, LEVOTHROID Take 50 mcg by mouth daily.   loratadine 10 MG tablet Commonly known as:  CLARITIN Take 10 mg by mouth daily.   meclizine 25 MG tablet Commonly known as:  ANTIVERT Take 1 tablet (25 mg total) by mouth 3 (three) times daily as needed for dizziness.   montelukast  10 MG tablet Commonly known as:  SINGULAIR Take 10 mg by mouth daily.   ondansetron 4 MG tablet Commonly known as:  ZOFRAN Take 1 tablet (4 mg total) by mouth every 6 (six) hours as needed for nausea or vomiting.   oxymetazoline 0.05 % nasal spray Commonly known as:  AFRIN Place 1 spray into both nostrils 2 (two) times daily as needed (for nose bleeds). Reported on 12/23/2015   polyethylene glycol packet Commonly known as:  MIRALAX / GLYCOLAX Take 17 g by mouth every other day. Mix with 8 ounces of fluid and drink   potassium chloride SA 20 MEQ tablet Commonly known as:  K-DUR,KLOR-CON Take 20 mEq by mouth daily.   ranitidine 150 MG tablet Commonly known as:  ZANTAC Take 150 mg by mouth daily.   REFRESH 1 % ophthalmic  solution Generic drug:  carboxymethylcellulose Place 1 drop into both eyes 3 (three) times daily.      No Known Allergies Follow-up Information    [name], [place]. Schedule an appointment as soon as possible for a visit in 1 week(s).   Specialty:  Internal Medicine Contact information: [address] [place] [address] [phone]            The results of significant diagnostics from this hospitalization (including imaging, microbiology, ancillary and laboratory) are listed below for reference.    Significant Diagnostic Studies: Ct Abdomen Pelvis Wo Contrast  Result Date: 08/21/2018 CLINICAL DATA:  Bowel obstruction. Nausea. History of colon cancer. EXAM: CT ABDOMEN AND PELVIS WITHOUT CONTRAST TECHNIQUE: Multidetector CT imaging of the abdomen and pelvis was performed following the standard protocol without IV contrast. COMPARISON:  06/23/2018 FINDINGS: Lower chest: Motion artifact in the lung bases with chronic mosaic attenuation, possibly air trapping. Mild bibasilar subsegmental atelectasis. Unchanged 1 cm calcified pleural nodule in the anterolateral right lung base. No pleural effusion. Cardiomegaly, coronary artery atherosclerosis, and pacemaker/ICD leads. Hepatobiliary: No focal liver abnormality is seen. Small stones dependently in the gallbladder. No biliary dilatation. Pancreas: Unremarkable. Spleen: Unremarkable. Adrenals/Urinary Tract: Unremarkable adrenal glands. Unchanged punctate calcification in the inferior aspect of the right renal hilum, possibly vascular. Unchanged subcentimeter low-density lesion in the lower pole of the right kidney, too small to fully characterize. No hydronephrosis. Moderately distended bladder. Stomach/Bowel: Sequelae of partial colectomy are again identified with a left lower quadrant ostomy. There is colonic diverticulosis without evidence of diverticulitis. A parastomal hernia is again noted containing a loop of small bowel.  Small bowel within the abdomen proximal to this is dilated and fluid-filled measuring up to 3.7 cm in diameter. Distal small bowel is collapsed. Vascular/Lymphatic: Abdominal aortic atherosclerosis without aneurysm. Unchanged subcentimeter short axis pelvic sidewall lymph nodes bilaterally. Reproductive: Status post hysterectomy. No adnexal masses. Other: No intraperitoneal free fluid. No pneumoperitoneum. Musculoskeletal: New L1 superior endplate compression fracture with 25% height loss. IMPRESSION: 1. Small bowel obstruction secondary to a parastomal hernia. 2. New L1 superior endplate compression fracture. 3.  Aortic Atherosclerosis (ICD10-I70.0). Electronically Signed   By: Logan Bores M.D.   On: 08/21/2018 16:43   Dg Abd Acute W/chest  Result Date: 08/21/2018 CLINICAL DATA:  Nausea since yesterday. One episode of vomiting. General malaise. Constipation. EXAM: DG ABDOMEN ACUTE W/ 1V CHEST COMPARISON:  Chest x-ray of March 15, 2018 and abdominal and pelvic CT scan of June 23, 2018. FINDINGS: The lungs are hypoinflated. There is chronic elevation of the right hemidiaphragm. The left retrocardiac region remains somewhat dense. The cardiac silhouette is enlarged.  The central pulmonary vascularity is prominent but stable. No cephalization of the vascular pattern is observed. The ICD is in stable position. Within the abdomen there is a loop of moderately distended gas-filled small bowel at the abdominopelvic junction. No free extraluminal gas collections are observed. The colonic stool burden is not increased. The decubitus radiographs reveal no free extraluminal gas collections. IMPRESSION: Findings compatible with a partial distal small bowel obstruction or ileus. No evidence of perforation. Hypoinflation of both lungs. Probable low-grade compensated CHF. No definite pneumonia. Electronically Signed   By: David  Martinique M.D.   On: 08/21/2018 13:33   Dg Abd Portable 1v  Result Date: 08/23/2018 CLINICAL  DATA:  Small-bowel obstruction. EXAM: PORTABLE ABDOMEN - 1 VIEW COMPARISON:  08/22/2018.  CT 08/21/2018. FINDINGS: NG tube noted with tip over the stomach. Cardiac pacer noted. Surgical clips noted over the left lower abdomen. No bowel distention. Stool noted throughout the colon. Contrast in the colon. Pelvic calcifications consistent phleboliths. IMPRESSION: NG tube noted with tip over the stomach. No bowel distention. Contrast in the colon. Electronically Signed   By: Marcello Moores  Register   On: 08/23/2018 08:25   Dg Abd Portable 1v-small Bowel Obstruction Protocol-initial, 8 Hr Delay  Result Date: 08/22/2018 CLINICAL DATA:  8 hour delayed small-bowel obstruction protocol. EXAM: PORTABLE ABDOMEN - 1 VIEW COMPARISON:  Abdominal radiograph performed 08/21/2018 FINDINGS: The patient's enteric tube is seen ending overlying the antrum of the stomach. The visualized bowel gas pattern is unremarkable. Contrast is noted within small bowel loops; no definite colonic contrast is characterized at this time. No free intra-abdominal air is identified, though evaluation for free air is limited on a single supine view. The visualized osseous structures are within normal limits; the sacroiliac joints are unremarkable in appearance. Left lower quadrant abdominal wall mesh is noted. IMPRESSION: Contrast noted within small bowel loops; no definite colonic contrast seen at this time. Would perform follow-up abdominal radiograph per small-bowel obstruction protocol. Electronically Signed   By: Garald Balding M.D.   On: 08/22/2018 06:25   Dg Abd Portable 1v-small Bowel Protocol-position Verification  Result Date: 08/21/2018 CLINICAL DATA:  Confirm gastric tube placement EXAM: PORTABLE ABDOMEN - 1 VIEW COMPARISON:  None. FINDINGS: The tip and side port of a gastric tube are noted coursing along the expected location of the gastric body and antrum. Scattered mild-to-moderately distended small bowel loops are noted in the lower  quadrants. Moderate stool retention within the ascending colon and distal sigmoid region. Lower lumbar degenerative disc disease with facet arthropathy from L4 through S1. Mild joint space narrowing of the included hips. Phleboliths are present in the right hemipelvis. IMPRESSION: Gastric tube with tip and side-port is seen along the expected location of the stomach. Mild moderate small bowel distention the lower quadrants. Electronically Signed   By: Ashley Royalty M.D.   On: 08/21/2018 19:23    Microbiology: Recent Results (from the past 240 hour(s))  MRSA PCR Screening     Status: None   Collection Time: 08/21/18 10:51 PM  Result Value Ref Range Status   MRSA by PCR NEGATIVE NEGATIVE Final    Comment:        The GeneXpert MRSA Assay (FDA approved for NASAL specimens only), is one component of a comprehensive MRSA colonization surveillance program. It is not intended to diagnose MRSA infection nor to guide or monitor treatment for MRSA infections. Performed at [facility], [place] [address], [place], [place] [address]      Labs: Basic  Metabolic Panel: Recent Labs  Lab 08/21/18 1425 08/22/18 0350 08/24/18 0345 08/26/18 0408  NA 141 142 146* 142  K 5.0 4.6 3.8 4.0  CL 101 104 110 104  CO2 32 _0 GLUCOSE 99 94 99 98  BUN 45* 42* 29* 28*  CREATININE 1.83* 1.83* 1.16* 1.19*  CALCIUM 9.7 9.3 9.2 9.2   Liver Function Tests: Recent Labs  Lab 08/21/18 1425  AST 19  ALT 24  ALKPHOS 81  BILITOT 0.2*  PROT 8.6*  ALBUMIN 3.6   Recent Labs  Lab 08/21/18 1425  LIPASE 24   No results for input(s): AMMONIA in the last 168 hours. CBC: Recent Labs  Lab 08/21/18 1425 08/22/18 0350 08/24/18 0345 08/26/18 0408  WBC 9.5 7.3 8.9 11.1*  NEUTROABS 7.2  --  6.5 7.4  HGB 9.2* 9.3* 8.4* 8.2*  HCT 31.5* 32.2* 28.7* 27.6*  MCV 93.2 93.1 92.0 93.9  PLT 307 307 330 336   Cardiac Enzymes: No results for input(s): CKTOTAL, CKMB, CKMBINDEX, TROPONINI in the  last 168 hours. BNP: BNP (last 3 results) Recent Labs    11/28/17 1623 01/23/18 0302 03/15/18 1056  BNP 179.3* 215.0* 221.7*    ProBNP (last 3 results) No results for input(s): PROBNP in the last 8760 hours.  CBG: No results for input(s): GLUCAP in the last 168 hours.     Signed:  Alma Friendly, MD [facility] 08/26/2018, 3:09 PM

## 2018-08-26 NOTE — Care Management Important Message (Signed)
Important Message  Patient Details  Name: ALISI LUPIEN MRN: [id number] Date of Birth: April 01, 1934   Medicare Important Message Given:  Yes    Kerin Salen 08/26/2018, 11:19 AMImportant Message  Patient Details  Name: SHACORIA LATIF MRN: [id number] Date of Birth: [birth year]   Medicare Important Message Given:  Yes    Kerin Salen 08/26/2018, 11:19 AM

## 2018-08-26 NOTE — Progress Notes (Signed)
PHARMACIST - PHYSICIAN COMMUNICATION  DR:   Horris Latino  CONCERNING: IV to Oral Route Change Policy  RECOMMENDATION: This patient is receiving pantoprazole by the intravenous route.  Based on criteria approved by the Pharmacy and Therapeutics Committee, the intravenous medication(s) is/are being converted to the equivalent oral dose form(s).   DESCRIPTION: These criteria include:  The patient is eating (either orally or via tube) and/or has been taking other orally administered medications for a least 24 hours  The patient has no evidence of active gastrointestinal bleeding or impaired GI absorption (gastrectomy, short bowel, patient on TNA or NPO).  If you have questions about this conversion, please contact the Pharmacy Department  []   [phone] )  [facility] []   [phone] )  [facility] []   [phone] )  [facility] []   [phone] )  [facility] []   [phone] )  [place], [facility] 08/26/2018 10:58 AM

## 2018-08-27 NOTE — Discharge Planning (Addendum)
Received phone call from [place] [phone]) 11/17 @ 2353 requesting report on patient. Staff at [facility] state that they never received report on pt. Placed facility on hold and contacted [facility]. Able to retrieve pt's information from 24hr discharge list. Noted at 1542 documentation that RN attempted to call report to [facility] but was unable to reach anyone. Pt was d/c'd from [place] @ 1922 and transported via [place] to facility. Read through pt's progress notes. Discussed with [facility] who agreed it was within reason for THIS RN to review hospital course and D/C summary with accepting facility. Reviewed PMH, hospital course, d/c summary, PT ambulation recommendations, SLP notes, and surgery progression reports, and mentioned that attempts had been made earlier in the day to call report. [facility] appreciate of efforts to facilitate smooth transition of care.

## 2018-08-28 ENCOUNTER — Non-Acute Institutional Stay (SKILLED_NURSING_FACILITY): Payer: Medicare HMO | Admitting: Internal Medicine

## 2018-08-28 ENCOUNTER — Encounter: Payer: Self-pay | Admitting: Internal Medicine

## 2018-08-28 DIAGNOSIS — N183 Chronic kidney disease, stage 3 unspecified: Secondary | ICD-10-CM

## 2018-08-28 DIAGNOSIS — K56609 Unspecified intestinal obstruction, unspecified as to partial versus complete obstruction: Secondary | ICD-10-CM | POA: Diagnosis not present

## 2018-08-28 DIAGNOSIS — Z8739 Personal history of other diseases of the musculoskeletal system and connective tissue: Secondary | ICD-10-CM | POA: Diagnosis not present

## 2018-08-28 DIAGNOSIS — D649 Anemia, unspecified: Secondary | ICD-10-CM

## 2018-08-28 NOTE — Assessment & Plan Note (Signed)
Hemoglobin/hematocrit 8.2/27.6 down from 9.3/32.2. Minor hypochromic/ normocytic indices Monitor for bleeding dyscrasias

## 2018-08-28 NOTE — Patient Instructions (Signed)
See assessment and plan under each diagnosis in the problem list and acutely for this visit 

## 2018-08-28 NOTE — Assessment & Plan Note (Signed)
Creatinine stable at 1.19

## 2018-08-28 NOTE — Assessment & Plan Note (Addendum)
08/28/18 patient complaining of pain, redness and swelling in her wrists/hands and ankles and knees  Previously on colchicine; not on HCTZ Update uric acid level

## 2018-08-28 NOTE — Assessment & Plan Note (Signed)
Clinically no recurrence of SBO

## 2018-08-28 NOTE — Progress Notes (Signed)
NURSING HOME LOCATION:  [facility] ROOM NUMBER:  [id number]  CODE STATUS:  Full Code  PCP:  [name], [address] [place] [address]   This is a comprehensive admission note to [facility] performed on this date less than 30 days from date of admission. Included are preadmission medical/surgical history; reconciled medication list; family history; social history and comprehensive review of systems.  Corrections and additions to the records were documented. Comprehensive physical exam was also performed. Additionally a clinical summary was entered for each active diagnosis pertinent to this admission in the Problem List to enhance continuity of care.  HPI: The patient was hospitalized 11/13-11/18/2019 with small bowel obstruction in context of recurrent parastomal hernia at left lower quadrant colostomy.  Patient presented from an ALF with nausea & vomiting x1 day with inability to consume any food.  The patient had noted decreased output from the colostomy.   CT of the abdomen/pelvis revealed a small bowel obstruction most likely secondary to parastomal hernia. General Surgery consulted; no surgical intervention was necessary as the small bowel obstruction resolved with small bowel obstruction protocol.  Diet was advanced and tolerated well.  Past medical and surgical history: Colorectal cancer status post chemo and radiation therapy and colostomy, CKD, dementia, COPD, asthma, chronic systolic/diastolic heart failure, history PTE, essential hypertension, hypothyroidism, dyslipidemia, diabetes and gout. Procedures and surgeries also include cholecystectomy, hysterectomy, and ICD placement  Social history: Non-smoker, nondrinker  Family history: Reviewed   Review of systems: Patient cannot give me the month and initially stated that it was 2016.  She corrected herself shortly thereafter.  There was a delay in identification of the president. She states "  don't feel good".  Her major concern is hurting in the ankles, knees, and hands with red, swollen joints.  She states that she takes Tylenol and some other medicine for "arthritis".  She does validate she has had gout. She also describes watery eyes and intermittent swelling of her lips and tongue. She is not on an ACE-I or ARB. She also has sneezing and wheezing on occasion.  She has intermittent cough with mucus production. Stools are dark; she relates this to iron supplementation.  Constitutional: No fever, significant weight change, fatigue  Eyes: No redness, discharge, pain, vision change ENT/mouth: No nasal congestion, purulent discharge, earache, change in hearing, sore throat  Cardiovascular: No chest pain, palpitations, paroxysmal nocturnal dyspnea, claudication, edema  Respiratory: No  hemoptysis,  significant snoring, apnea  Gastrointestinal: No heartburn, dysphagia, abdominal pain, nausea /vomiting, rectal bleeding,definite melena Genitourinary: No dysuria, hematuria, pyuria, incontinence, nocturia Dermatologic: No rash, pruritus, change in appearance of skin Neurologic: No dizziness, headache, syncope, seizures, numbness, tingling Psychiatric: No significant anxiety, depression, insomnia, anorexia Endocrine: No change in hair/skin/nails, excessive thirst, excessive hunger, excessive urination  Hematologic/lymphatic: No significant bruising, lymphadenopathy, abnormal bleeding  Physical exam:  Pertinent or positive findings: Affect is markedly flat and responses are slow.  She has slight temporal wasting.  There is a subcutaneous deficit over the right anterior scalp line of the forehead.  Complete dentures are present.  The left nasolabial fold is slightly decreased.  Breath sounds are decreased.  Posterior tibial pulses are decreased, dorsalis pedis pulses are strong.  She has generalized weakness in all extremities which appears symmetric.  She has fusiform changes of her wrist and  also the knees.  Skin over the shins is dry and slightly cracking.  General appearance:no acute distress, increased work of breathing is  present.   Lymphatic: No lymphadenopathy about the head, neck, axilla. Eyes: No conjunctival inflammation or lid edema is present. There is no scleral icterus. Ears:  External ear exam shows no significant lesions or deformities.   Nose:  External nasal examination shows no deformity or inflammation. Nasal mucosa are pink and moist without lesions, exudates Oral exam: Lips and gums are healthy appearing.There is no oropharyngeal erythema or exudate. Neck:  No thyromegaly, masses, tenderness noted.    Heart:  Normal rate and regular rhythm. S1 and S2 normal without gallop, murmur, click, rub.  Lungs:  without wheezes, rhonchi, rales, rubs. Abdomen: Bowel sounds are normal.  Abdomen is soft and nontender with no organomegaly, hernias, masses. GU: Deferred  Extremities:  No cyanosis, clubbing, edema. Neurologic exam:  Strength equal  in upper & lower extremities. Balance, Rhomberg, finger to nose testing could not be completed due to clinical state Skin: Warm & dry w/o tenting. No significant lesions or rash.  See clinical summary under each active problem in the Problem List with associated updated therapeutic plan

## 2018-09-13 ENCOUNTER — Non-Acute Institutional Stay (SKILLED_NURSING_FACILITY): Payer: Medicare HMO | Admitting: Adult Health

## 2018-09-13 ENCOUNTER — Encounter: Payer: Self-pay | Admitting: Adult Health

## 2018-09-13 DIAGNOSIS — D509 Iron deficiency anemia, unspecified: Secondary | ICD-10-CM | POA: Diagnosis not present

## 2018-09-13 DIAGNOSIS — J439 Emphysema, unspecified: Secondary | ICD-10-CM | POA: Diagnosis not present

## 2018-09-13 DIAGNOSIS — K219 Gastro-esophageal reflux disease without esophagitis: Secondary | ICD-10-CM

## 2018-09-13 DIAGNOSIS — E785 Hyperlipidemia, unspecified: Secondary | ICD-10-CM

## 2018-09-13 DIAGNOSIS — K56609 Unspecified intestinal obstruction, unspecified as to partial versus complete obstruction: Secondary | ICD-10-CM | POA: Diagnosis not present

## 2018-09-13 DIAGNOSIS — M1A9XX0 Chronic gout, unspecified, without tophus (tophi): Secondary | ICD-10-CM

## 2018-09-13 DIAGNOSIS — I10 Essential (primary) hypertension: Secondary | ICD-10-CM | POA: Diagnosis not present

## 2018-09-13 DIAGNOSIS — E039 Hypothyroidism, unspecified: Secondary | ICD-10-CM

## 2018-09-13 DIAGNOSIS — I5022 Chronic systolic (congestive) heart failure: Secondary | ICD-10-CM

## 2018-09-13 DIAGNOSIS — E876 Hypokalemia: Secondary | ICD-10-CM

## 2018-09-13 DIAGNOSIS — G309 Alzheimer's disease, unspecified: Secondary | ICD-10-CM

## 2018-09-13 DIAGNOSIS — F0280 Dementia in other diseases classified elsewhere without behavioral disturbance: Secondary | ICD-10-CM

## 2018-09-13 NOTE — Progress Notes (Signed)
Location:  [name] Room Number: 950-D Place of Service:  SNF (31) Provider:  Durenda Age, NP  Patient Care Team: [name], PA as PCP - General (Internal Medicine) Shelby Skates, MD as Consulting Physician (General Surgery)  Extended Emergency Contact Information Primary Emergency Contact: [name],[name] Address: [place], [place] [address] [place] Phone: [phone] Relation: Son  Code Status:  Full Code  Goals of care: Advanced Directive information Advanced Directives 08/21/2018  Does Patient Have a Medical Advance Directive? No  Type of Advance Directive -  Does patient want to make changes to medical advance directive? -  Copy of [place] in Chart? -  Would patient like information on creating a medical advance directive? No - Patient declined     Chief Complaint  Patient presents with  . Discharge Note    Patient to discharge to [facility] 09/13/18.    HPI:  Pt is an 84 y.o. female seen today for discharge.  She is to transfer to [facility] on 09/13/18 with home health PT and OT.   She has been admitted to [facility] on 08/26/18 from a recent hospitalization due to small bowel obstruction. She has a PMH of colorectal cancer, CKD, dementia, COPD, chronic systolic/diastolic heart failure, essential hypertension, hypothyroidism, dyslipidemia, diabetes, and gout.  She was complaining of nausea and vomiting x1 day. She has history of rectal cancer for which she had abdominal perineal resection with permanent colostomy 10/2004 and peristomal hernia repair in 2007.  Colostomy output was noted to be decreased.  CT of the abdomen pelvis showed small bowel obstruction likely secondary to parastomal hernia.  General surgery was consulted and SBO protocol, NGT was inserted for decompression. No surgery was performed. SBO resolved.  Patient  was admitted to this facility for short-term rehabilitation after the patient's recent hospitalization.  Patient has completed SNF rehabilitation and therapy has cleared the patient for discharge.   Past Medical History:  Diagnosis Date  . Anginal pain ([place])   . Arthritis    "comes and goes" (03/01/2015)  . Asthma   . CHF (congestive heart failure) ([place])   . Colon cancer ([place])   . Colostomy care ([place])   . COPD (chronic obstructive pulmonary disease) ([place])   . Diabetes mellitus without complication ([place])    pt denies this hx on 03/01/2015  . HCAP (healthcare-associated pneumonia) 04/22/2017  . Heart murmur   . History of blood transfusion 03/01/2015; 04/23/2017   "related to anemia,"  . Hyperlipidemia   . Hypertension   . Hypothyroidism   . Pneumonia    "just once" (03/01/2015)  . Presence of combination internal cardiac defibrillator (ICD) and pacemaker    2005 in [facility]  . Pulmonary embolism ([place])   . Renal disorder    Past Surgical History:  Procedure Laterality Date  . ABDOMINAL HYSTERECTOMY    . ABDOMINOPERINEAL PROCTOCOLECTOMY  2006   Rectal cancer  . ANKLE FRACTURE SURGERY Left   . CARDIAC CATHETERIZATION N/A 03/22/2015   Procedure: Right/Left Heart Cath and Coronary Angiography;  Surgeon: Leonie Man, MD;  Location: [facility];  Service: Cardiovascular;  Laterality: N/A;  . CATARACT EXTRACTION W/ INTRAOCULAR LENS  IMPLANT, BILATERAL Bilateral   . CHOLECYSTECTOMY    . DILATION AND CURETTAGE OF UTERUS    . FRACTURE SURGERY    . PARASTOMAL HERNIA REPAIR  2007  lysis of adhesions  . TUBAL LIGATION      No Known Allergies  Outpatient Encounter Medications as of 09/13/2018  Medication Sig  . acetaminophen (TYLENOL) 500 MG tablet Take 1,000 mg by mouth 3 (three) times daily.   Marland Kitchen albuterol (PROVENTIL) (2.5 MG/3ML) 0.083% nebulizer solution Take 3 mLs (2.5 mg total) by nebulization every 4 (four) hours as needed for wheezing.  Marland Kitchen allopurinol (ZYLOPRIM) 100 MG  tablet Take 100 mg by mouth daily.  Marland Kitchen aspirin EC 81 MG tablet Take 1 tablet (81 mg total) by mouth every morning.  Marland Kitchen atorvastatin (LIPITOR) 20 MG tablet Take 20 mg by mouth daily.   . carboxymethylcellulose (REFRESH) 1 % ophthalmic solution Place 1 drop into both eyes 3 (three) times daily.   . carvedilol (COREG) 6.25 MG tablet Take 6.25 mg by mouth 2 (two) times daily with a meal.   . cyclobenzaprine (FLEXERIL) 5 MG tablet Take 5 mg by mouth 2 (two) times daily as needed for muscle spasms.  . cyclobenzaprine (FLEXERIL) 5 MG tablet Take 5 mg by mouth at bedtime.  . Fluticasone-Salmeterol (ADVAIR) 500-50 MCG/DOSE AEPB Inhale 1 puff into the lungs 2 (two) times daily. Rinse mouth after use  . furosemide (LASIX) 20 MG tablet Take 20 mg by mouth daily as needed (weight gain of more >3 lbs in 24 hours).   . furosemide (LASIX) 40 MG tablet Take 1 tablet (40 mg total) by mouth daily.  . hydrALAZINE (APRESOLINE) 25 MG tablet Take 0.5 tablets (12.5 mg total) by mouth 3 (three) times daily.  . iron polysaccharides (NIFEREX) 150 MG capsule Take 150 mg by mouth 2 (two) times daily.  . isosorbide mononitrate (IMDUR) 30 MG 24 hr tablet Take 1 tablet (30 mg total) by mouth daily.  Marland Kitchen levothyroxine (SYNTHROID, LEVOTHROID) 50 MCG tablet Take 50 mcg by mouth daily.   Marland Kitchen loratadine (CLARITIN) 10 MG tablet Take 10 mg by mouth daily.  . meclizine (ANTIVERT) 25 MG tablet Take 1 tablet (25 mg total) by mouth 3 (three) times daily as needed for dizziness.  . Menthol-Methyl Salicylate (ICY HOT) 33-29 % STCK Apply 1 application topically 2 (two) times daily as needed (knee pain.).  Marland Kitchen montelukast (SINGULAIR) 10 MG tablet Take 10 mg by mouth daily.   . ondansetron (ZOFRAN) 4 MG tablet Take 1 tablet (4 mg total) by mouth every 6 (six) hours as needed for nausea or vomiting.  Marland Kitchen oxymetazoline (AFRIN) 0.05 % nasal spray Place 1 spray into both nostrils 2 (two) times daily as needed (for nose bleeds). Reported on 12/23/2015  .  polyethylene glycol (MIRALAX / GLYCOLAX) packet Take 17 g by mouth every other day. Mix with 8 ounces of fluid and drink  . potassium chloride SA (K-DUR,KLOR-CON) 20 MEQ tablet Take 20 mEq by mouth daily.   . ranitidine (ZANTAC) 150 MG tablet Take 150 mg by mouth daily.  . [DISCONTINUED] Polysaccharide Iron Complex (FERREX 150 PO) Take 150 oz by mouth 2 (two) times daily.   No facility-administered encounter medications on file as of 09/13/2018.     Review of Systems  GENERAL: No change in appetite, no fatigue, no weight changes, no fever, chills or weakness MOUTH and THROAT: Denies oral discomfort, gingival pain or bleeding RESPIRATORY: no cough, SOB, DOE, wheezing, hemoptysis CARDIAC: No chest pain, or palpitations GI: No abdominal pain, diarrhea, constipation, heart burn, nausea or vomiting GU: Denies dysuria, frequency, hematuria, incontinence, or discharge PSYCHIATRIC: Denies feelings of depression or anxiety. No report of hallucinations, insomnia,  paranoia, or agitation   Immunization History  Administered Date(s) Administered  . DTaP 04/15/2010  . Influenza,inj,Quad PF,6+ Mos 07/27/2015  . Influenza-Unspecified 10/09/1998, 07/27/2011, 07/04/2012, 07/15/2013, 07/17/2014  . PPD Test 04/28/2015  . Pneumococcal Polysaccharide-23 04/15/2010, 07/04/2012, 07/28/2015   Pertinent  Health Maintenance Due  Topic Date Due  . DEXA SCAN  03/24/2001  . PNA vac Low Risk Adult (2 of 2 - PCV13) 07/27/2016  . INFLUENZA VACCINE  05/09/2018      Vitals:   09/13/18 0843  BP: 135/89  Pulse: 76  Resp: 17  Temp: 98.1 F (36.7 C)  TempSrc: Oral  SpO2: 98%  Weight: 206 lb 9.6 oz (93.7 kg)  Height: 5\' 6"  (1.676 m)   Body mass index is 33.35 kg/m.  Physical Exam  GENERAL APPEARANCE: Well nourished. In no acute distress. Obese SKIN:  Skin is warm and dry.  MOUTH and THROAT: Lips are without lesions. Oral mucosa is moist and without lesions. Tongue is normal in shape, size, and color and  without lesions RESPIRATORY: Breathing is even & unlabored, BS CTAB CARDIAC: RRR, no murmur,no extra heart sounds, BLE2+ edema GI: + colostomy   EXTREMITIES:  Able to move X 4 extremities NEUROLOGICAL: There is no tremor. Speech is clear PSYCHIATRIC: Alert and oriented X 3. Affect and behavior are appropriate   Labs reviewed: Recent Labs    12/01/17 0551  08/22/18 0350 08/24/18 0345 08/26/18 0408  NA 140   < > 142 146* 142  K 4.1   < > 4.6 3.8 4.0  CL 103   < > 104 110 104  CO2 26   < > 26 25 28   GLUCOSE 80   < > 94 99 98  BUN 31*   < > 42* 29* 28*  CREATININE 1.30*   < > 1.83* 1.16* 1.19*  CALCIUM 8.7*   < > 9.3 9.2 9.2  MG 1.9  --   --   --   --    < > = values in this interval not displayed.   Recent Labs    03/15/18 1059 06/23/18 1611 08/21/18 1425  AST 21 37 19  ALT 18 38 24  ALKPHOS 78 91 81  BILITOT 0.4 0.8 0.2*  PROT 7.4 7.6 8.6*  ALBUMIN 3.7 3.5 3.6   Recent Labs    08/21/18 1425 08/22/18 0350 08/24/18 0345 08/26/18 0408  WBC 9.5 7.3 8.9 11.1*  NEUTROABS 7.2  --  6.5 7.4  HGB 9.2* 9.3* 8.4* 8.2*  HCT 31.5* 32.2* 28.7* 27.6*  MCV 93.2 93.1 92.0 93.9  PLT 307 307 330 336   Lab Results  Component Value Date   TSH 1.797 02/16/2017   Lab Results  Component Value Date   HGBA1C 6.0 (H) 04/26/2015   Lab Results  Component Value Date   CHOL 117 10/28/2017   HDL 46 10/28/2017   LDLCALC 63 10/28/2017   TRIG 41 10/28/2017   CHOLHDL 2.5 10/28/2017    Significant Diagnostic Results in last 30 days:  Ct Abdomen Pelvis Wo Contrast  Result Date: 08/21/2018 CLINICAL DATA:  Bowel obstruction. Nausea. History of colon cancer. EXAM: CT ABDOMEN AND PELVIS WITHOUT CONTRAST TECHNIQUE: Multidetector CT imaging of the abdomen and pelvis was performed following the standard protocol without IV contrast. COMPARISON:  06/23/2018 FINDINGS: Lower chest: Motion artifact in the lung bases with chronic mosaic attenuation, possibly air trapping. Mild bibasilar  subsegmental atelectasis. Unchanged 1 cm calcified pleural nodule in the anterolateral right lung base. No pleural effusion.  Cardiomegaly, coronary artery atherosclerosis, and pacemaker/ICD leads. Hepatobiliary: No focal liver abnormality is seen. Small stones dependently in the gallbladder. No biliary dilatation. Pancreas: Unremarkable. Spleen: Unremarkable. Adrenals/Urinary Tract: Unremarkable adrenal glands. Unchanged punctate calcification in the inferior aspect of the right renal hilum, possibly vascular. Unchanged subcentimeter low-density lesion in the lower pole of the right kidney, too small to fully characterize. No hydronephrosis. Moderately distended bladder. Stomach/Bowel: Sequelae of partial colectomy are again identified with a left lower quadrant ostomy. There is colonic diverticulosis without evidence of diverticulitis. A parastomal hernia is again noted containing a loop of small bowel. Small bowel within the abdomen proximal to this is dilated and fluid-filled measuring up to 3.7 cm in diameter. Distal small bowel is collapsed. Vascular/Lymphatic: Abdominal aortic atherosclerosis without aneurysm. Unchanged subcentimeter short axis pelvic sidewall lymph nodes bilaterally. Reproductive: Status post hysterectomy. No adnexal masses. Other: No intraperitoneal free fluid. No pneumoperitoneum. Musculoskeletal: New L1 superior endplate compression fracture with 25% height loss. IMPRESSION: 1. Small bowel obstruction secondary to a parastomal hernia. 2. New L1 superior endplate compression fracture. 3.  Aortic Atherosclerosis (ICD10-I70.0). Electronically Signed   By: Logan Bores M.D.   On: 08/21/2018 16:43   Dg Abd Acute W/chest  Result Date: 08/21/2018 CLINICAL DATA:  Nausea since yesterday. One episode of vomiting. General malaise. Constipation. EXAM: DG ABDOMEN ACUTE W/ 1V CHEST COMPARISON:  Chest x-ray of March 15, 2018 and abdominal and pelvic CT scan of June 23, 2018. FINDINGS: The lungs  are hypoinflated. There is chronic elevation of the right hemidiaphragm. The left retrocardiac region remains somewhat dense. The cardiac silhouette is enlarged. The central pulmonary vascularity is prominent but stable. No cephalization of the vascular pattern is observed. The ICD is in stable position. Within the abdomen there is a loop of moderately distended gas-filled small bowel at the abdominopelvic junction. No free extraluminal gas collections are observed. The colonic stool burden is not increased. The decubitus radiographs reveal no free extraluminal gas collections. IMPRESSION: Findings compatible with a partial distal small bowel obstruction or ileus. No evidence of perforation. Hypoinflation of both lungs. Probable low-grade compensated CHF. No definite pneumonia. Electronically Signed   By: David  Martinique M.D.   On: 08/21/2018 13:33   Dg Abd Portable 1v  Result Date: 08/23/2018 CLINICAL DATA:  Small-bowel obstruction. EXAM: PORTABLE ABDOMEN - 1 VIEW COMPARISON:  08/22/2018.  CT 08/21/2018. FINDINGS: NG tube noted with tip over the stomach. Cardiac pacer noted. Surgical clips noted over the left lower abdomen. No bowel distention. Stool noted throughout the colon. Contrast in the colon. Pelvic calcifications consistent phleboliths. IMPRESSION: NG tube noted with tip over the stomach. No bowel distention. Contrast in the colon. Electronically Signed   By: Marcello Moores  Register   On: 08/23/2018 08:25   Dg Abd Portable 1v-small Bowel Obstruction Protocol-initial, 8 Hr Delay  Result Date: 08/22/2018 CLINICAL DATA:  8 hour delayed small-bowel obstruction protocol. EXAM: PORTABLE ABDOMEN - 1 VIEW COMPARISON:  Abdominal radiograph performed 08/21/2018 FINDINGS: The patient's enteric tube is seen ending overlying the antrum of the stomach. The visualized bowel gas pattern is unremarkable. Contrast is noted within small bowel loops; no definite colonic contrast is characterized at this time. No free  intra-abdominal air is identified, though evaluation for free air is limited on a single supine view. The visualized osseous structures are within normal limits; the sacroiliac joints are unremarkable in appearance. Left lower quadrant abdominal wall mesh is noted. IMPRESSION: Contrast noted within small bowel loops; no definite colonic contrast seen at  this time. Would perform follow-up abdominal radiograph per small-bowel obstruction protocol. Electronically Signed   By: Garald Balding M.D.   On: 08/22/2018 06:25   Dg Abd Portable 1v-small Bowel Protocol-position Verification  Result Date: 08/21/2018 CLINICAL DATA:  Confirm gastric tube placement EXAM: PORTABLE ABDOMEN - 1 VIEW COMPARISON:  None. FINDINGS: The tip and side port of a gastric tube are noted coursing along the expected location of the gastric body and antrum. Scattered mild-to-moderately distended small bowel loops are noted in the lower quadrants. Moderate stool retention within the ascending colon and distal sigmoid region. Lower lumbar degenerative disc disease with facet arthropathy from L4 through S1. Mild joint space narrowing of the included hips. Phleboliths are present in the right hemipelvis. IMPRESSION: Gastric tube with tip and side-port is seen along the expected location of the stomach. Mild moderate small bowel distention the lower quadrants. Electronically Signed   By: Ashley Royalty M.D.   On: 08/21/2018 19:23    Assessment/Plan  1. SBO (small bowel obstruction) (HCC) -surgery was consulted, SBO protocol/NGT insertion was done, SBO now resolved   2. Essential hypertension -well-controlled, continue hydralazine 25 mg 1/2 tab = 12.5 mg 3 times a day, carvedilol 6.25 mg 1 tab twice a day   3. Iron deficiency anemia, unspecified iron deficiency anemia type -continue Ferrex 150 mg 1 capsule twice a day no Lab Results  Component Value Date   HGB 8.2 (L) 08/26/2018     4. Pulmonary emphysema, unspecified emphysema type  ([place]) - continue Wixela 500-50 inhub inhale 1 puff by mouth twice a day, albuterol as needed   5. Chronic systolic CHF (congestive heart failure) (HCC) -continue Lasix 40 mg daily,  and 20 mg as needed, isosorbide mononitrate ER 30 mg 1 tab daily, KCl ER 20 meq 1 tab daily and carvedilol 6.25 mg 1 tab twice a day   6. Gastroesophageal reflux disease, esophagitis presence not specified -continue ranitidine 150 mg 1 tab daily   7. Hypothyroidism, unspecified type -continue levothyroxine 50 mcg 1 tab daily Lab Results  Component Value Date   TSH 1.797 02/16/2017    8. Hyperlipidemia, unspecified hyperlipidemia  -continue atorvastatin 20 mg 1 tab nightly     I have filled out patient's discharge paperwork and written prescriptions.  Patient will receive home health PT and OT.  DME provided:  None   Total discharge time: Less than 30 minutes  Discharge time involved coordination of the discharge process with social worker, nursing staff and therapy department. Medical justification for home health services verified.   Durenda Age, NP [facility] [phone] (Monday-Friday 8:00 a.m. - 5:00 p.m.) [phone] (after hours)

## 2019-01-23 IMAGING — CR DG ABDOMEN ACUTE W/ 1V CHEST
4 series · 4 of 4 positions shown · non-contrast
Comparison: Chest x-ray March 15, 2018 and abdominal and pelvic CT
scan June 23, 2018.

CLINICAL DATA: Nausea since yesterday. One episode of vomiting.
General malaise. Constipation.

EXAM:
DG ABDOMEN ACUTE W/ 1V CHEST

[x chest ap]
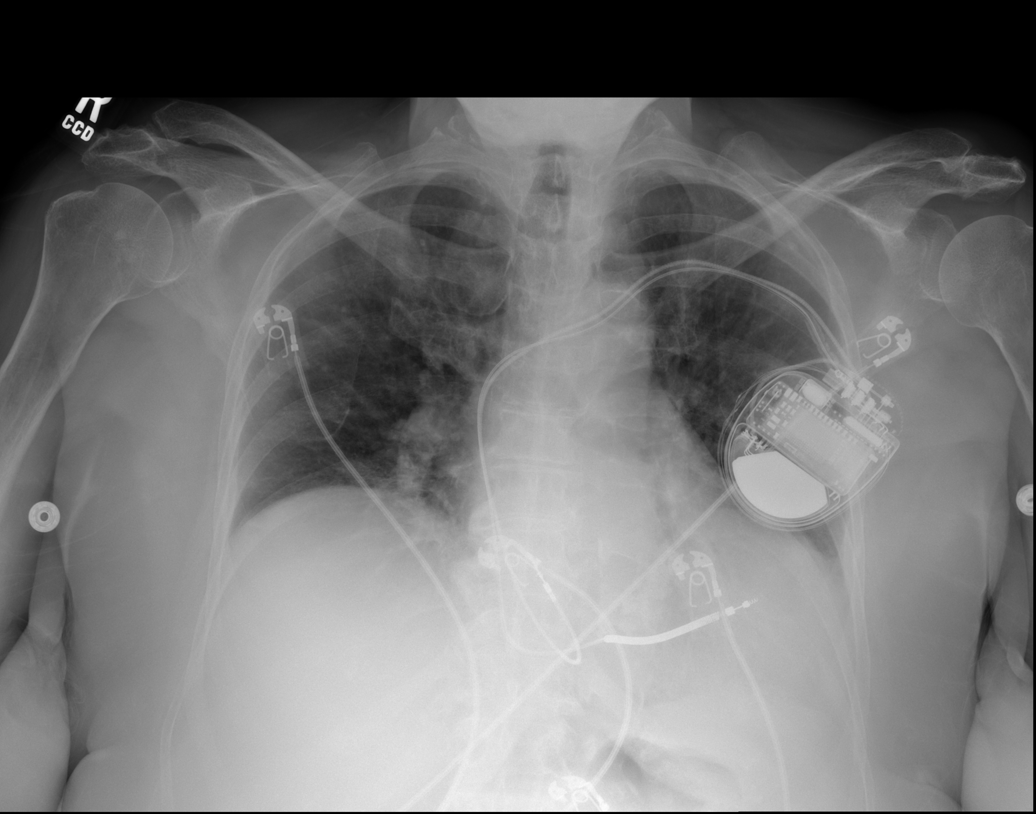

[x abdomen supine]
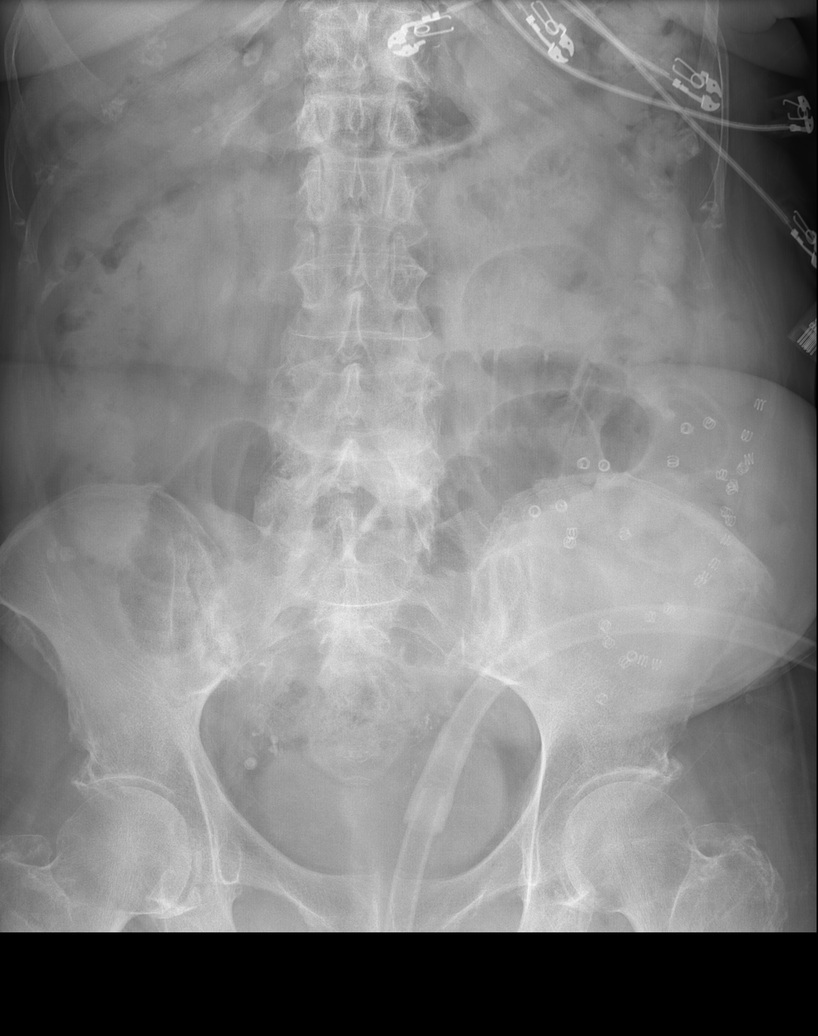

[w abdomen decub (1 of 2)]
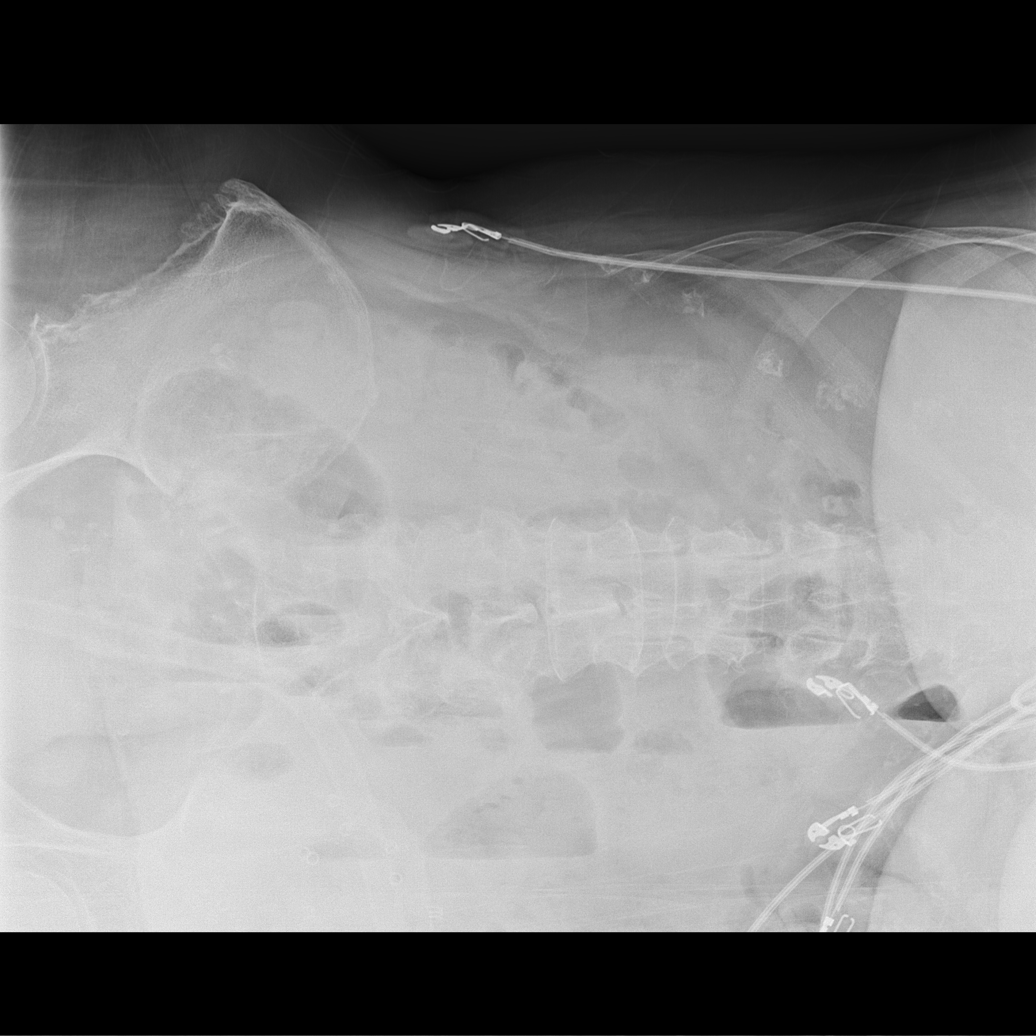

[w abdomen decub (2 of 2)]
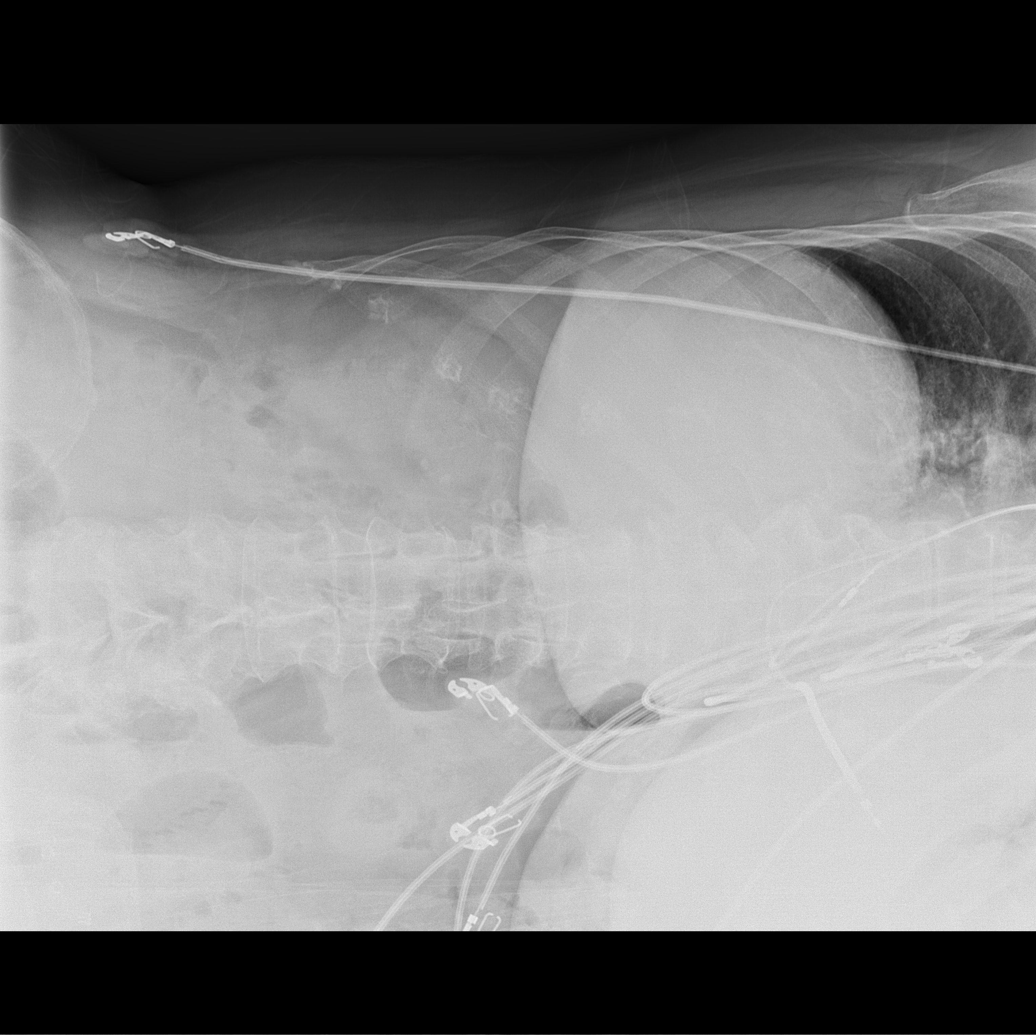

[4 of 4 positions shown; findings below may reference images not displayed]

FINDINGS: The lungs are hypoinflated. There is chronic elevation of the right
hemidiaphragm. The left retrocardiac region remains somewhat dense.
The cardiac silhouette is enlarged. The central pulmonary
vascularity is prominent but stable. No cephalization of the
vascular pattern is observed. The ICD is in stable position.

Within the abdomen there is a loop of moderately distended
gas-filled small bowel at the abdominopelvic junction. No free
extraluminal gas collections are observed. The colonic stool burden
is not increased. The decubitus radiographs reveal no free
extraluminal gas collections.
IMPRESSION: Findings compatible with a partial distal small bowel obstruction or
ileus. No evidence of perforation.

Hypoinflation of both lungs. Probable low-grade compensated CHF. No
definite pneumonia.

## 2019-04-18 ENCOUNTER — Other Ambulatory Visit: Payer: Self-pay

## 2019-04-18 ENCOUNTER — Encounter (HOSPITAL_COMMUNITY): Payer: Self-pay

## 2019-04-18 ENCOUNTER — Inpatient Hospital Stay (HOSPITAL_COMMUNITY)
Admission: EM | Admit: 2019-04-18 | Discharge: 2019-04-26 | DRG: 389 | Disposition: A | Discharge status: Skilled Nursing Facility | Payer: Medicare HMO | Attending: Internal Medicine | Admitting: Internal Medicine

## 2019-04-18 ENCOUNTER — Emergency Department (HOSPITAL_COMMUNITY): Payer: Medicare HMO

## 2019-04-18 ENCOUNTER — Inpatient Hospital Stay (HOSPITAL_COMMUNITY): Payer: Medicare HMO

## 2019-04-18 DIAGNOSIS — Z9842 Cataract extraction status, left eye: Secondary | ICD-10-CM

## 2019-04-18 DIAGNOSIS — D631 Anemia in chronic kidney disease: Secondary | ICD-10-CM | POA: Diagnosis present

## 2019-04-18 DIAGNOSIS — E1122 Type 2 diabetes mellitus with diabetic chronic kidney disease: Secondary | ICD-10-CM | POA: Diagnosis present

## 2019-04-18 DIAGNOSIS — Z9841 Cataract extraction status, right eye: Secondary | ICD-10-CM

## 2019-04-18 DIAGNOSIS — I5042 Chronic combined systolic (congestive) and diastolic (congestive) heart failure: Secondary | ICD-10-CM | POA: Diagnosis present

## 2019-04-18 DIAGNOSIS — E785 Hyperlipidemia, unspecified: Secondary | ICD-10-CM

## 2019-04-18 DIAGNOSIS — K433 Parastomal hernia with obstruction, without gangrene: Secondary | ICD-10-CM

## 2019-04-18 DIAGNOSIS — Z85048 Personal history of other malignant neoplasm of rectum, rectosigmoid junction, and anus: Secondary | ICD-10-CM

## 2019-04-18 DIAGNOSIS — K565 Intestinal adhesions [bands], unspecified as to partial versus complete obstruction: Principal | ICD-10-CM | POA: Diagnosis present

## 2019-04-18 DIAGNOSIS — E870 Hyperosmolality and hypernatremia: Secondary | ICD-10-CM | POA: Diagnosis present

## 2019-04-18 DIAGNOSIS — Z79891 Long term (current) use of opiate analgesic: Secondary | ICD-10-CM

## 2019-04-18 DIAGNOSIS — K435 Parastomal hernia without obstruction or  gangrene: Secondary | ICD-10-CM | POA: Diagnosis present

## 2019-04-18 DIAGNOSIS — E039 Hypothyroidism, unspecified: Secondary | ICD-10-CM | POA: Diagnosis present

## 2019-04-18 DIAGNOSIS — Z8249 Family history of ischemic heart disease and other diseases of the circulatory system: Secondary | ICD-10-CM | POA: Diagnosis not present

## 2019-04-18 DIAGNOSIS — E860 Dehydration: Secondary | ICD-10-CM | POA: Diagnosis present

## 2019-04-18 DIAGNOSIS — M109 Gout, unspecified: Secondary | ICD-10-CM | POA: Diagnosis present

## 2019-04-18 DIAGNOSIS — D649 Anemia, unspecified: Secondary | ICD-10-CM

## 2019-04-18 DIAGNOSIS — N179 Acute kidney failure, unspecified: Secondary | ICD-10-CM | POA: Diagnosis present

## 2019-04-18 DIAGNOSIS — G309 Alzheimer's disease, unspecified: Secondary | ICD-10-CM | POA: Diagnosis not present

## 2019-04-18 DIAGNOSIS — Z86711 Personal history of pulmonary embolism: Secondary | ICD-10-CM | POA: Diagnosis not present

## 2019-04-18 DIAGNOSIS — Z7951 Long term (current) use of inhaled steroids: Secondary | ICD-10-CM

## 2019-04-18 DIAGNOSIS — N183 Chronic kidney disease, stage 3 unspecified: Secondary | ICD-10-CM | POA: Diagnosis present

## 2019-04-18 DIAGNOSIS — K56609 Unspecified intestinal obstruction, unspecified as to partial versus complete obstruction: Secondary | ICD-10-CM | POA: Diagnosis present

## 2019-04-18 DIAGNOSIS — J449 Chronic obstructive pulmonary disease, unspecified: Secondary | ICD-10-CM | POA: Diagnosis present

## 2019-04-18 DIAGNOSIS — I130 Hypertensive heart and chronic kidney disease with heart failure and stage 1 through stage 4 chronic kidney disease, or unspecified chronic kidney disease: Secondary | ICD-10-CM | POA: Diagnosis present

## 2019-04-18 DIAGNOSIS — Z933 Colostomy status: Secondary | ICD-10-CM | POA: Diagnosis not present

## 2019-04-18 DIAGNOSIS — Z1159 Encounter for screening for other viral diseases: Secondary | ICD-10-CM | POA: Diagnosis not present

## 2019-04-18 DIAGNOSIS — Z961 Presence of intraocular lens: Secondary | ICD-10-CM | POA: Diagnosis present

## 2019-04-18 DIAGNOSIS — Z0189 Encounter for other specified special examinations: Secondary | ICD-10-CM

## 2019-04-18 DIAGNOSIS — I2510 Atherosclerotic heart disease of native coronary artery without angina pectoris: Secondary | ICD-10-CM | POA: Diagnosis present

## 2019-04-18 DIAGNOSIS — J439 Emphysema, unspecified: Secondary | ICD-10-CM | POA: Diagnosis not present

## 2019-04-18 DIAGNOSIS — I10 Essential (primary) hypertension: Secondary | ICD-10-CM | POA: Diagnosis present

## 2019-04-18 DIAGNOSIS — Z95810 Presence of automatic (implantable) cardiac defibrillator: Secondary | ICD-10-CM | POA: Diagnosis not present

## 2019-04-18 DIAGNOSIS — E876 Hypokalemia: Secondary | ICD-10-CM | POA: Diagnosis present

## 2019-04-18 DIAGNOSIS — H81399 Other peripheral vertigo, unspecified ear: Secondary | ICD-10-CM | POA: Diagnosis present

## 2019-04-18 DIAGNOSIS — F0280 Dementia in other diseases classified elsewhere without behavioral disturbance: Secondary | ICD-10-CM | POA: Diagnosis not present

## 2019-04-18 DIAGNOSIS — Z7982 Long term (current) use of aspirin: Secondary | ICD-10-CM

## 2019-04-18 DIAGNOSIS — F0390 Unspecified dementia without behavioral disturbance: Secondary | ICD-10-CM | POA: Diagnosis present

## 2019-04-18 DIAGNOSIS — Z79890 Hormone replacement therapy (postmenopausal): Secondary | ICD-10-CM

## 2019-04-18 DIAGNOSIS — Z79899 Other long term (current) drug therapy: Secondary | ICD-10-CM

## 2019-04-18 LAB — COMPREHENSIVE METABOLIC PANEL
ALT: 18 U/L (ref 0–44)
AST: 15 U/L (ref 15–41)
Albumin: 4 g/dL (ref 3.5–5.0)
Alkaline Phosphatase: 70 U/L (ref 38–126)
Anion gap: 11 (ref 5–15)
BUN: 28 mg/dL — ABNORMAL HIGH (ref 8–23)
CO2: 32 mmol/L (ref 22–32)
Calcium: 10.6 mg/dL — ABNORMAL HIGH (ref 8.9–10.3)
Chloride: 97 mmol/L — ABNORMAL LOW (ref 98–111)
Creatinine, Ser: 1.46 mg/dL — ABNORMAL HIGH (ref 0.44–1.00)
GFR calc Af Amer: 38 mL/min — ABNORMAL LOW (ref >60)
GFR calc non Af Amer: 33 mL/min — ABNORMAL LOW (ref >60)
Glucose, Bld: 104 mg/dL — ABNORMAL HIGH (ref 70–99)
Potassium: 4.1 mmol/L (ref 3.5–5.1)
Sodium: 140 mmol/L (ref 135–145)
Total Bilirubin: 0.4 mg/dL (ref 0.3–1.2)
Total Protein: 7.6 g/dL (ref 6.5–8.1)

## 2019-04-18 LAB — URINALYSIS, ROUTINE W REFLEX MICROSCOPIC
Bilirubin Urine: NEGATIVE
Glucose, UA: NEGATIVE mg/dL
Hgb urine dipstick: NEGATIVE
Ketones, ur: NEGATIVE mg/dL
Nitrite: NEGATIVE
Protein, ur: NEGATIVE mg/dL
Specific Gravity, Urine: 1.012 (ref 1.005–1.030)
pH: 5 (ref 5.0–8.0)

## 2019-04-18 LAB — CBC
HCT: 34.4 % — ABNORMAL LOW (ref 36.0–46.0)
Hemoglobin: 10.4 g/dL — ABNORMAL LOW (ref 12.0–15.0)
MCH: 28.8 pg (ref 26.0–34.0)
MCHC: 30.2 g/dL (ref 30.0–36.0)
MCV: 95.3 fL (ref 80.0–100.0)
Platelets: 253 10*3/uL (ref 150–400)
RBC: 3.61 MIL/uL — ABNORMAL LOW (ref 3.87–5.11)
RDW: 14.8 % (ref 11.5–15.5)
WBC: 9.1 10*3/uL (ref 4.0–10.5)
nRBC: 0 % (ref 0.0–0.2)

## 2019-04-18 LAB — SARS CORONAVIRUS 2 BY RT PCR (HOSPITAL ORDER, PERFORMED IN ~~LOC~~ HOSPITAL LAB): SARS Coronavirus 2: NEGATIVE

## 2019-04-18 LAB — LACTIC ACID, PLASMA: Lactic Acid, Venous: 0.9 mmol/L (ref 0.5–1.9)

## 2019-04-18 LAB — LIPASE, BLOOD: Lipase: 21 U/L (ref 11–51)

## 2019-04-18 MED ORDER — ONDANSETRON HCL 4 MG/2ML IJ SOLN
4.0000 mg | Freq: Four times a day (QID) | INTRAMUSCULAR | Status: DC | PRN
  Administered 2019-04-24: 4 mg via INTRAVENOUS
  Filled 2019-04-18: qty 2

## 2019-04-18 MED ORDER — ONDANSETRON HCL 4 MG/2ML IJ SOLN
4.0000 mg | Freq: Once | INTRAMUSCULAR | Status: AC
  Administered 2019-04-18: 4 mg via INTRAVENOUS
  Filled 2019-04-18: qty 2

## 2019-04-18 MED ORDER — SODIUM CHLORIDE 0.9 % IV SOLN
INTRAVENOUS | Status: AC
  Administered 2019-04-18: via INTRAVENOUS

## 2019-04-18 MED ORDER — FENTANYL CITRATE (PF) 100 MCG/2ML IJ SOLN
100.0000 ug | Freq: Once | INTRAMUSCULAR | Status: AC
  Administered 2019-04-18: 100 ug via INTRAVENOUS
  Filled 2019-04-18: qty 2

## 2019-04-18 MED ORDER — HYDRALAZINE HCL 20 MG/ML IJ SOLN
10.0000 mg | INTRAMUSCULAR | Status: DC | PRN
  Administered 2019-04-18: 10 mg via INTRAVENOUS
  Filled 2019-04-18 (×2): qty 1

## 2019-04-18 MED ORDER — ONDANSETRON HCL 4 MG PO TABS: 4.0000 mg | ORAL_TABLET | Freq: Four times a day (QID) | ORAL | Status: DC | PRN

## 2019-04-18 MED ORDER — ACETAMINOPHEN 650 MG RE SUPP: 650.0000 mg | Freq: Four times a day (QID) | RECTAL | Status: DC | PRN

## 2019-04-18 MED ORDER — MOMETASONE FURO-FORMOTEROL FUM 200-5 MCG/ACT IN AERO
2.0000 | INHALATION_SPRAY | Freq: Two times a day (BID) | RESPIRATORY_TRACT | Status: DC
  Administered 2019-04-19 – 2019-04-26 (×13): 2 via RESPIRATORY_TRACT
  Filled 2019-04-18: qty 8.8

## 2019-04-18 MED ORDER — ACETAMINOPHEN 325 MG PO TABS: 650.0000 mg | ORAL_TABLET | Freq: Four times a day (QID) | ORAL | Status: DC | PRN

## 2019-04-18 MED ORDER — SODIUM CHLORIDE 0.9 % IV BOLUS
1000.0000 mL | Freq: Once | INTRAVENOUS | Status: AC
  Administered 2019-04-18: 1000 mL via INTRAVENOUS

## 2019-04-18 MED ORDER — MORPHINE SULFATE (PF) 2 MG/ML IV SOLN
1.0000 mg | INTRAVENOUS | Status: DC | PRN
  Administered 2019-04-19 – 2019-04-21 (×6): 1 mg via INTRAVENOUS
  Filled 2019-04-18 (×6): qty 1

## 2019-04-18 MED ORDER — DIATRIZOATE MEGLUMINE & SODIUM 66-10 % PO SOLN
90.0000 mL | Freq: Once | ORAL | Status: AC
  Administered 2019-04-18: 90 mL via NASOGASTRIC
  Filled 2019-04-18: qty 90

## 2019-04-18 NOTE — Consult Note (Signed)
General Surgery [facility]  Reason for Consult: small bowel obstruction  Referring Physician: Dr. Annice Needy ER  [name] [name] is an 85 y.o. female.  HPI: Patient is an 85 year old female who presents to the emergency department with signs and symptoms of small bowel obstruction.  Patient is responsive but it is difficult to get a complete history.  Patient states that she began noticing abdominal distention yesterday.  She developed nausea and vomiting.  She has had no output from her colostomy today.  Patient presented to the emergency department for evaluation.  This included a CT scan of the abdomen and pelvis.  CT noted a parastomal hernia.  There is partial obstruction of a loop of small bowel involved with the parastomal hernia.  There appears to be a more high-grade obstruction distally in the right abdomen likely secondary to adhesions.  General surgery is called for evaluation and assistance with management of small bowel obstruction.  Patient has a complex past surgical history.  She apparently underwent resection of rectal carcinoma in 2006.  She has undergone attempted repair of parastomal hernia with recurrence.  She has had a cholecystectomy.  She has had a hysterectomy.  Patient has multiple significant medical problems including coronary artery disease, congestive heart failure, and COPD.  Past Medical History:  Diagnosis Date  . Anginal pain ([place])   . Arthritis    "comes and goes" (03/01/2015)  . Asthma   . CHF (congestive heart failure) ([place])   . Colon cancer ([place])   . Colostomy care ([place])   . COPD (chronic obstructive pulmonary disease) ([place])   . Diabetes mellitus without complication ([place])    pt denies this hx on 03/01/2015  . HCAP (healthcare-associated pneumonia) 04/22/2017  . Heart murmur   . History of blood transfusion 03/01/2015; 04/23/2017   "related to anemia,"  . Hyperlipidemia   . Hypertension   . Hypothyroidism   . Pneumonia     "just once" (03/01/2015)  . Presence of combination internal cardiac defibrillator (ICD) and pacemaker    2005 in [facility]  . Pulmonary embolism ([place])   . Renal disorder     Past Surgical History:  Procedure Laterality Date  . ABDOMINAL HYSTERECTOMY    . ABDOMINOPERINEAL PROCTOCOLECTOMY  2006   Rectal cancer  . ANKLE FRACTURE SURGERY Left   . CARDIAC CATHETERIZATION N/A 03/22/2015   Procedure: Right/Left Heart Cath and Coronary Angiography;  Surgeon: Leonie Man, MD;  Location: [facility];  Service: Cardiovascular;  Laterality: N/A;  . CATARACT EXTRACTION W/ INTRAOCULAR LENS  IMPLANT, BILATERAL Bilateral   . CHOLECYSTECTOMY    . DILATION AND CURETTAGE OF UTERUS    . FRACTURE SURGERY    . PARASTOMAL HERNIA REPAIR  2007   lysis of adhesions  . TUBAL LIGATION      Family History  Problem Relation Age of Onset  . CAD Other   . Hypertension Other   . Stroke Other   . Colon cancer Neg Hx   . GI Bleed Neg Hx     Social History:  reports that she has never smoked. She has never used smokeless tobacco. She reports that she does not drink alcohol or use drugs.  Allergies: No Known Allergies  Medications: I have reviewed the patient's current medications.  Results for orders placed or performed during the hospital encounter of 04/18/19 (from the past 48 hour(s))  Lipase, blood     Status: None   Collection Time: 04/18/19  4:48 PM  Result Value Ref Range   Lipase 21 11 - 51 U/L    Comment: Performed at [facility], [place] [address] [place], [place] [address]  Comprehensive metabolic panel     Status: Abnormal   Collection Time: 04/18/19  4:48 PM  Result Value Ref Range   Sodium 140 135 - 145 mmol/L   Potassium 4.1 3.5 - 5.1 mmol/L   Chloride 97 (L) 98 - 111 mmol/L   CO2 32 22 - 32 mmol/L   Glucose, Bld 104 (H) 70 - 99 mg/dL   BUN 28 (H) 8 - 23 mg/dL   Creatinine, Ser 1.46 (H) 0.44 - 1.00 mg/dL   Calcium 10.6 (H) 8.9 - 10.3 mg/dL   Total Protein  7.6 6.5 - 8.1 g/dL   Albumin 4.0 3.5 - 5.0 g/dL   AST 15 15 - 41 U/L   ALT 18 0 - 44 U/L   Alkaline Phosphatase 70 38 - 126 U/L   Total Bilirubin 0.4 0.3 - 1.2 mg/dL   GFR calc non Af Amer 33 (L) >60 mL/min   GFR calc Af Amer 38 (L) >60 mL/min   Anion gap 11 5 - 15    Comment: Performed at [facility], [place] [address], [place], [place] [address]  CBC     Status: Abnormal   Collection Time: 04/18/19  4:48 PM  Result Value Ref Range   WBC 9.1 4.0 - 10.5 K/uL   RBC 3.61 (L) 3.87 - 5.11 MIL/uL   Hemoglobin 10.4 (L) 12.0 - 15.0 g/dL   HCT 34.4 (L) 36.0 - 46.0 %   MCV 95.3 80.0 - 100.0 fL   MCH 28.8 26.0 - 34.0 pg   MCHC 30.2 30.0 - 36.0 g/dL   RDW 14.8 11.5 - 15.5 %   Platelets 253 150 - 400 K/uL   nRBC 0.0 0.0 - 0.2 %    Comment: Performed at [facility], [place] [address] [place], [place] [address]  Urinalysis, Routine w reflex microscopic     Status: Abnormal   Collection Time: 04/18/19  4:48 PM  Result Value Ref Range   Color, Urine YELLOW YELLOW   APPearance HAZY (A) CLEAR   Specific Gravity, Urine 1.012 1.005 - 1.030   pH 5.0 5.0 - 8.0   Glucose, UA NEGATIVE NEGATIVE mg/dL   Hgb urine dipstick NEGATIVE NEGATIVE   Bilirubin Urine NEGATIVE NEGATIVE   Ketones, ur NEGATIVE NEGATIVE mg/dL   Protein, ur NEGATIVE NEGATIVE mg/dL   Nitrite NEGATIVE NEGATIVE   Leukocytes,Ua TRACE (A) NEGATIVE   RBC / HPF 0-5 0 - 5 RBC/hpf   WBC, UA 0-5 0 - 5 WBC/hpf   Bacteria, UA RARE (A) NONE SEEN   Squamous Epithelial / LPF 0-5 0 - 5    Comment: Performed at [facility], [place] [address], [place], [place] [address]  Lactic acid, plasma     Status: None   Collection Time: 04/18/19  4:48 PM  Result Value Ref Range   Lactic Acid, Venous 0.9 0.5 - 1.9 mmol/L    Comment: Performed at [facility], [place] [address], [place], [place] [address]  SARS Coronavirus 2 (CEPHEID - Performed in [place] hospital lab), Hosp Order      Status: None   Collection Time: 04/18/19  4:48 PM   Specimen: Urine, Clean Catch; Nasopharyngeal  Result Value Ref Range   SARS Coronavirus 2 NEGATIVE NEGATIVE    Comment: (NOTE)  If result is NEGATIVE SARS-CoV-2 target nucleic acids are NOT DETECTED. The SARS-CoV-2 RNA is generally detectable in upper and lower  respiratory specimens during the acute phase of infection. The lowest  concentration of SARS-CoV-2 viral copies this assay can detect is 250  copies / mL. A negative result does not preclude SARS-CoV-2 infection  and should not be used as the sole basis for treatment or other  patient management decisions.  A negative result may occur with  improper specimen collection / handling, submission of specimen other  than nasopharyngeal swab, presence of viral mutation(s) within the  areas targeted by this assay, and inadequate number of viral copies  (<250 copies / mL). A negative result must be combined with clinical  observations, patient history, and epidemiological information. If result is POSITIVE SARS-CoV-2 target nucleic acids are DETECTED. The SARS-CoV-2 RNA is generally detectable in upper and lower  respiratory specimens dur ing the acute phase of infection.  Positive  results are indicative of active infection with SARS-CoV-2.  Clinical  correlation with patient history and other diagnostic information is  necessary to determine patient infection status.  Positive results do  not rule out bacterial infection or co-infection with other viruses. If result is PRESUMPTIVE POSTIVE SARS-CoV-2 nucleic acids MAY BE PRESENT.   A presumptive positive result was obtained on the submitted specimen  and confirmed on repeat testing.  While 2019 novel coronavirus  (SARS-CoV-2) nucleic acids may be present in the submitted sample  additional confirmatory testing may be necessary for epidemiological  and / or clinical management purposes  to differentiate between  SARS-CoV-2 and other  Sarbecovirus currently known to infect humans.  If clinically indicated additional testing with an alternate test  methodology [phone]) is advised. The SARS-CoV-2 RNA is generally  detectable in upper and lower respiratory sp ecimens during the acute  phase of infection. The expected result is Negative. Fact Sheet for Patients:  StrictlyIdeas.no Fact Sheet for Healthcare Providers: BankingDealers.co.za This test is not yet approved or cleared by the [place] FDA and has been authorized for detection and/or diagnosis of SARS-CoV-2 by FDA under an Emergency Use Authorization (EUA).  This EUA will remain in effect (meaning this test can be used) for the duration of the COVID-19 declaration under Section 564(b)(1) of the Act, 21 U.S.C. section 360bbb-3(b)(1), unless the authorization is terminated or revoked sooner. Performed at [facility], [place] [address], [place], [place] [address]     Ct Abdomen Pelvis Wo Contrast  Result Date: 04/18/2019 CLINICAL DATA:  Abdominal pain and distention, nausea vomiting for 2 days. No fevers, no chills. Extensive surgical history. EXAM: CT ABDOMEN AND PELVIS WITHOUT CONTRAST TECHNIQUE: Multidetector CT imaging of the abdomen and pelvis was performed following the standard protocol without IV contrast. COMPARISON:  CT abdomen pelvis 08/11/2018, 06/23/2018 FINDINGS: Lower chest: AICD leads positioned in the right atrium and cardiac apex. There is cardiomegaly with biatrial enlargement as well as mitral annular calcification and coronary artery calcifications. There are bibasilar areas of subsegmental atelectasis and/or scarring. Hepatobiliary: No visible or contour deforming and [place] lesions. Dependently layering calcified gallstones are present within the otherwise normal gallbladder. No biliary ductal dilatation. No calcified intraductal gallstones. Pancreas: Unremarkable. No pancreatic  ductal dilatation or surrounding inflammatory changes. Spleen: Normal in size without focal abnormality. Adrenals/Urinary Tract: Adrenal glands are unremarkable. There is new mild right hydroureteronephrosis without visualized calcification along the course of the right ureter. Vascular calcifications are noted in both renal hila. No focal lesion  or hydronephrosis. Bladder is unremarkable. Stomach/Bowel: Postsurgical changes from prior proctocolectomy with left lower quadrant end colostomy formation. Small hiatal hernia. Stomach is distended with ingested material. There is extensive air in fluid dilatation of the proximal small bowel. Portion of the distended small bowel enters into a peristomal hernia sac in the left lower quadrant however the exiting bowel also appears distended with a second tapering transition point identified in the right lower quadrant (series 3, image 64) where the distal small bowel beyond this point is largely decompressed. The cecum is displaced into the low midline pelvis. Fecal material is present throughout the remaining portions of the colon. Vascular/Lymphatic: Aortic atherosclerosis. No enlarged abdominal or pelvic lymph nodes. No portal venous gas or air within the splanchnic draining vessels. Reproductive: Post hysterectomy.  No concerning adnexal lesions. Other: Parastomal hernia detailed above. And anterior abdominal wall laxity likely related to multiple prior surgical procedures. No abdominopelvic ascites. Musculoskeletal: No acute or significant osseous findings. Stable appearance of the L1 superior endplate compression deformity when compared to most recent CT. IMPRESSION: 1. Postsurgical changes from proctocolectomy with end colostomy in the left lower quadrant. 2. Small-bowel obstruction with narrowing at the entry of the previously described parastomal hernia which appears only partially obstructive on today's exam with dilated loops beyond the hernia sac. A more distal  transition point seen in the right lower quadrant, as detailed above. No evidence of pneumatosis. No free air or free fluid. 3. New mild right hydroureteronephrosis without visualized calcification along the course of the right ureter, which may reflect a recently passed stone. 4. Cholelithiasis without evidence of acute cholecystitis. 5. Coronary and aortic atherosclerosis. 6. Cardiomegaly with biatrial enlargement. These results were called by telephone at the time of interpretation on 04/18/2019 at 7:53 pm to Dr. Theotis Burrow , who verbally acknowledged these results. Electronically Signed   By: MD Lovena Le   On: 04/18/2019 19:54    Review of Systems  Constitutional: Negative for chills, diaphoresis and fever.  HENT: Negative.   Eyes: Negative.   Respiratory: Negative.   Cardiovascular: Positive for leg swelling.  Gastrointestinal: Positive for abdominal pain, nausea and vomiting.  Genitourinary: Negative.   Musculoskeletal: Negative.   Skin: Negative.   Neurological: Negative.   Endo/Heme/Allergies: Negative.   Psychiatric/Behavioral: Negative.    Blood pressure (!) 161/75, pulse 90, temperature 99.9 F (37.7 C), temperature source Oral, resp. rate 12, height 5\' 6"  (1.676 m), weight 98 kg, SpO2 100 %. Physical Exam  Constitutional: She appears well-developed and well-nourished. No distress.  HENT:  Head: Normocephalic and atraumatic.  Mouth/Throat: No oropharyngeal exudate.  Eyes: Pupils are equal, round, and reactive to light. Conjunctivae are normal. No scleral icterus.  Neck: Normal range of motion. Neck supple. No tracheal deviation present. No thyromegaly present.  Cardiovascular: Normal rate, regular rhythm and normal heart sounds.  Respiratory: Effort normal and breath sounds normal. No respiratory distress. She has no wheezes. She has no rales.  GI: She exhibits distension. She exhibits no mass. There is no abdominal tenderness. There is no rebound and no guarding.   Multiple complex surgical incisions which are well healed; colostomy in LLQ with ostomy bag in place, no output in bag; small parastomal hernia, soft, non-tender  Musculoskeletal: Normal range of motion.        General: Edema (moderate LE edema bilateral) present.  Lymphadenopathy:    She has no cervical adenopathy.  Neurological: She is alert.  Skin: Skin is warm and dry. She is not diaphoretic.  Psychiatric: She has a normal mood and affect. Her behavior is normal.    Assessment/Plan: Small bowel obstruction, likely secondary to adhesions Parastomal hernia, recurrent Hx of rectal carcinoma Presence of colostomy  Agree with admission to medical service for management.  General surgery will follow as consultants.  Hopefully she can be managed nonoperatively as she will be high risk for complications with any operative intervention.  Recommend nasogastric tube placement and decompression initially.  Will require judicious fluid resuscitation.  Will order follow-up abdominal x-ray for tomorrow morning.  General surgery will follow closely with the medical service.  Armandina [place]ma, [place] [facility] Office: [place] 04/18/2019, 9:55 PM

## 2019-04-18 NOTE — H&P (Signed)
[name] TDS:[id number] DOB: Jul 03, 1934 DOA: 04/18/2019     PCP: [name], PA   Outpatient Specialists: * NONE CARDS: * Dr. NEphrology: *  Dr. NEurology *   Dr. Pulmonary *  Dr.  Oncology * Dr. Fabienne Bruns* Dr.  Sadie Haber, [place]) Urology Dr. *  Patient arrived to ER on 04/18/19 at 1459  Patient coming from:  From facility [facility]  Chief Complaint:  Chief Complaint  Patient presents with  . Abdominal Pain    HPI: [name] is a 85 y.o. female with medical history significant of small bowel obstruction CKD gout, peripheral vertigo recurrent recurrent parastomal hernia at left lower quadrant colostomy, history of rectal cancer, dementia, COPD, chronic systolic and diastolicHF ,statuspost ICD, hypertension, hypothyroidism, hyperlipidemia  Presented with abdominal distention and abdominal pain nausea vomiting for the past 2 days had vomiting last night.  Patient has colostomy bag and continues to have bowel movements in the colostomy.  Denies any fevers or chills  Admission for small bowel obstruction was in November 2019 was able to resolve with conservative measures  Infectious risk factors:  Reports  N/V/ abdominal pain,    In  ER RAPID COVID TEST NEGATIVE     Regarding pertinent Chronic problems:    Hyperlipidemia -  on statins Lipitor   HTN on coreg,imdur, hydralazine   CHF diastolic/systolic/ combined - last echo 04/2017 showed EF of 40 to 45%, moderate diffuse hypokinesis, PA peak pressure of 65 mmHg On lasix 40 mg qday and 20 mg PRN    Chronic anemia secondary to chronic kidney disease at baseline Hg around 10    Hypothyroidism:  Lab Results  Component Value Date   TSH 1.797 02/16/2017   on synthroid     Anemia secondary to CKD   COPD - not  on baseline oxygen   On Advair   CKD stage III - baseline Cr 1.2   While in ER: CT showed small bowel obstruction New hydro likely due to recently passed stone  gen Surgery will see pt Held off  The  following Work up has been ordered so far:  Orders Placed This Encounter  Procedures  . SARS Coronavirus 2 (CEPHEID - Performed in [place] hospital lab), [facility]  . CT Abdomen Pelvis Wo Contrast  . DG Abd Portable 1V-Small Bowel Protocol-Position Verification  . DG Abd Portable 1V-Small Bowel Obstruction Protocol-initial, 8 hr delay  . Lipase, blood  . Comprehensive metabolic panel  . CBC  . Urinalysis, Routine w reflex microscopic  . Lactic acid, plasma  . Diet NPO time specified  . Saline Lock IV, Maintain IV access  . Refer to Sidebar: Small Bowel Obstruction Protocol  . Gastric tube  . Elevate head of bed  . Ensure patient not vomiting prior to administration of Gastrografin. Notify MD if patient is actively vomiting  . Clamp NG tube for 1 hour after administration of Gastrografin then resume NG tube to low wall intermittent suction  . Consult to general surgery  ALL PATIENTS BEING ADMITTED/HAVING PROCEDURES NEED COVID-19 SCREENING  . Consult to hospitalist  ALL PATIENTS BEING ADMITTED/HAVING PROCEDURES NEED COVID-19 SCREENING  . Social work consult  . Admit to Inpatient (patient's expected length of stay will be greater than 2 midnights or inpatient only procedure)    Following Medications were ordered in ER: Medications  diatrizoate meglumine-sodium (GASTROGRAFIN) 66-10 % solution 90 mL (has no administration in time range)  ondansetron (ZOFRAN) injection 4 mg (4 mg Intravenous  Given 04/18/19 1640)  fentaNYL (SUBLIMAZE) injection 100 mcg (100 mcg Intravenous Given 04/18/19 1640)  sodium chloride 0.9 % bolus 1,000 mL (0 mLs Intravenous Stopped 04/18/19 2204)        Consult Orders  (From admission, onward)         Start     Ordered   04/18/19 2242  Social work consult  Once    Provider:  (Not yet assigned)  Question:  Reason for Consult?  Answer:  admitted from facility   04/18/19 2242   04/18/19 2115  Consult to hospitalist  ALL PATIENTS BEING ADMITTED/HAVING  PROCEDURES NEED COVID-19 SCREENING  Once    Comments: ALL PATIENTS BEING ADMITTED/HAVING PROCEDURES NEED COVID-19 SCREENING  Provider:  (Not yet assigned)  Question Answer Comment  Place call to: [facility]   Reason for Consult Admit      04/18/19 2114          ER Provider Called:  Gen surgery   Dr. Harlow Asa They Recommend admit to medicine   Will see    in ER   Significant initial  Findings: Abnormal Labs Reviewed  COMPREHENSIVE METABOLIC PANEL - Abnormal; Notable for the following components:      Result Value   Chloride 97 (*)    Glucose, Bld 104 (*)    BUN 28 (*)    Creatinine, Ser 1.46 (*)    Calcium 10.6 (*)    GFR calc non Af Amer 33 (*)    GFR calc Af Amer 38 (*)    All other components within normal limits  CBC - Abnormal; Notable for the following components:   RBC 3.61 (*)    Hemoglobin 10.4 (*)    HCT 34.4 (*)    All other components within normal limits  URINALYSIS, ROUTINE W REFLEX MICROSCOPIC - Abnormal; Notable for the following components:   APPearance HAZY (*)    Leukocytes,Ua TRACE (*)    Bacteria, UA RARE (*)    All other components within normal limits    Otherwise labs showing:    Recent Labs  Lab 04/18/19 1648  NA 140  K 4.1  CO2 32  GLUCOSE 104*  BUN 28*  CREATININE 1.46*  CALCIUM 10.6*    Cr     Up from baseline see below Lab Results  Component Value Date   CREATININE 1.46 (H) 04/18/2019   CREATININE 1.19 (H) 08/26/2018   CREATININE 1.16 (H) 08/24/2018    Recent Labs  Lab 04/18/19 1648  AST 15  ALT 18  ALKPHOS 70  BILITOT 0.4  PROT 7.6  ALBUMIN 4.0   Lab Results  Component Value Date   CALCIUM 10.6 (H) 04/18/2019   PHOS 3.7 07/24/2015      WBC      Component Value Date/Time   WBC 9.1 04/18/2019 1648   ANC    Component Value Date/Time   NEUTROABS 7.4 08/26/2018 0408   NEUTROABS 2.8 04/15/2009 1105   ALC No results found for: LYMPHOABS    Plt: Lab Results  Component Value Date   PLT 253  04/18/2019     Lactic Acid, Venous    Component Value Date/Time   LATICACIDVEN 0.9 04/18/2019 1648       COVID-19 Labs     Lab Results  Component Value Date   SARSCOV2NAA NEGATIVE 04/18/2019         HG/HCT * stable,  Down *Up from baseline see below    Component Value Date/Time   HGB 10.4 (L)  04/18/2019 1648   HGB 10.3 (L) 04/15/2009 1105   HCT 34.4 (L) 04/18/2019 1648   HCT 30.0 (L) 04/27/2015 1557   HCT 30.5 (L) 04/15/2009 1105    Recent Labs  Lab 04/18/19 1648  LIPASE 21      UA   no evidence of UTI      Urine analysis:    Component Value Date/Time   COLORURINE YELLOW 04/18/2019 1648   APPEARANCEUR HAZY (A) 04/18/2019 1648   LABSPEC 1.012 04/18/2019 1648   PHURINE 5.0 04/18/2019 1648   GLUCOSEU NEGATIVE 04/18/2019 1648   HGBUR NEGATIVE 04/18/2019 1648   BILIRUBINUR NEGATIVE 04/18/2019 1648   KETONESUR NEGATIVE 04/18/2019 1648   PROTEINUR NEGATIVE 04/18/2019 1648   UROBILINOGEN 0.2 07/23/2015 1610   NITRITE NEGATIVE 04/18/2019 1648   LEUKOCYTESUR TRACE (A) 04/18/2019 1648      CTabd/pelvis - sbo    ECG: not obtained   ED Triage Vitals  Enc Vitals Group     BP 04/18/19 1507 (!) 185/79     Pulse Rate 04/18/19 1507 96     Resp 04/18/19 1507 14     Temp 04/18/19 1507 99.9 F (37.7 C)     Temp Source 04/18/19 1507 Oral     SpO2 04/18/19 1507 96 %     Weight 04/18/19 1509 216 lb (98 kg)     Height 04/18/19 1509 5\' 6"  (1.676 m)     Head Circumference --      Peak Flow --      Pain Score 04/18/19 1509 6     Pain Loc --      Pain Edu? --      Excl. in [place]? --   TMAX(24)@       Latest  Blood pressure (!) 161/75, pulse 90, temperature 99.9 F (37.7 C), temperature source Oral, resp. rate 12, height 5\' 6"  (1.676 m), weight 98 kg, SpO2 100 %.     Hospitalist was called for admission for SBO   Review of Systems:    Pertinent positives include:  abdominal pain, nausea, vomiting  Constitutional:  No weight loss, night sweats, Fevers,  chills, fatigue, weight loss  HEENT:  No headaches, Difficulty swallowing,Tooth/dental problems,Sore throat,  No sneezing, itching, ear ache, nasal congestion, post nasal drip,  Cardio-vascular:  No chest pain, Orthopnea, PND, anasarca, dizziness, palpitations.no Bilateral lower extremity swelling  GI:  No heartburn, indigestion,, diarrhea, change in bowel habits, loss of appetite, melena, blood in stool, hematemesis Resp:  no shortness of breath at rest. No dyspnea on exertion, No excess mucus, no productive cough, No non-productive cough, No coughing up of blood.No change in color of mucus.No wheezing. Skin:  no rash or lesions. No jaundice GU:  no dysuria, change in color of urine, no urgency or frequency. No straining to urinate.  No flank pain.  Musculoskeletal:  No joint pain or no joint swelling. No decreased range of motion. No back pain.  Psych:  No change in mood or affect. No depression or anxiety. No memory loss.  Neuro: no localizing neurological complaints, no tingling, no weakness, no double vision, no gait abnormality, no slurred speech, no confusion  All systems reviewed and apart from [place] all are negative  Past Medical History:   Past Medical History:  Diagnosis Date  . Anginal pain ([place])   . Arthritis    "comes and goes" (03/01/2015)  . Asthma   . CHF (congestive heart failure) ([place])   . Colon cancer ([place])   . Colostomy  care [facility])   . COPD (chronic obstructive pulmonary disease) ([place])   . Diabetes mellitus without complication ([place])    pt denies this hx on 03/01/2015  . HCAP (healthcare-associated pneumonia) 04/22/2017  . Heart murmur   . History of blood transfusion 03/01/2015; 04/23/2017   "related to anemia,"  . Hyperlipidemia   . Hypertension   . Hypothyroidism   . Pneumonia    "just once" (03/01/2015)  . Presence of combination internal cardiac defibrillator (ICD) and pacemaker    2005 in [facility]  . Pulmonary embolism ([place])   . Renal disorder        Past Surgical History:  Procedure Laterality Date  . ABDOMINAL HYSTERECTOMY    . ABDOMINOPERINEAL PROCTOCOLECTOMY  2006   Rectal cancer  . ANKLE FRACTURE SURGERY Left   . CARDIAC CATHETERIZATION N/A 03/22/2015   Procedure: Right/Left Heart Cath and Coronary Angiography;  Surgeon: Leonie Man, MD;  Location: [facility];  Service: Cardiovascular;  Laterality: N/A;  . CATARACT EXTRACTION W/ INTRAOCULAR LENS  IMPLANT, BILATERAL Bilateral   . CHOLECYSTECTOMY    . DILATION AND CURETTAGE OF UTERUS    . FRACTURE SURGERY    . PARASTOMAL HERNIA REPAIR  2007   lysis of adhesions  . TUBAL LIGATION      Social History:  Ambulatory   Gilford Rile     reports that she has never smoked. She has never used smokeless tobacco. She reports that she does not drink alcohol or use drugs.    Family History:   Family History  Problem Relation Age of Onset  . CAD Other   . Hypertension Other   . Stroke Other   . Colon cancer Neg Hx   . GI Bleed Neg Hx     Allergies: No Known Allergies   Prior to Admission medications   Medication Sig Start Date End Date Taking? Authorizing Provider  acetaminophen (TYLENOL) 500 MG tablet Take 1,000 mg by mouth 3 (three) times daily.    Yes [provider]  allopurinol (ZYLOPRIM) 100 MG tablet Take 100 mg by mouth daily.   Yes [provider]  aspirin EC 81 MG tablet Take 1 tablet (81 mg total) by mouth every morning. 03/03/15  Yes Rai, Ripudeep K, MD  atorvastatin (LIPITOR) 20 MG tablet Take 20 mg by mouth at bedtime.    Yes [provider]  Calcium Carbonate-Vitamin D 600-400 MG-UNIT tablet Take 1 tablet by mouth 2 (two) times a day.   Yes [provider]  carboxymethylcellulose (REFRESH) 1 % ophthalmic solution Place 1 drop into both eyes 3 (three) times daily.    Yes [provider]  carvedilol (COREG) 6.25 MG tablet Take 6.25 mg by mouth 2 (two) times daily with a meal.    Yes [provider]   cyclobenzaprine (FLEXERIL) 5 MG tablet Take 5 mg by mouth at bedtime.   Yes [provider]  famotidine (PEPCID) 40 MG tablet Take 40 mg by mouth daily.   Yes [provider]  furosemide (LASIX) 20 MG tablet Take 20 mg by mouth daily as needed (weight gain of more >3 lbs in 24 hours).    Yes [provider]  furosemide (LASIX) 40 MG tablet Take 1 tablet (40 mg total) by mouth daily. 12/30/15  Yes Shirley Friar, PA-C  hydrALAZINE (APRESOLINE) 25 MG tablet Take 0.5 tablets (12.5 mg total) by mouth 3 (three) times daily. 12/23/15  Yes Clegg, Amy D, NP  iron polysaccharides (NIFEREX)  150 MG capsule Take 150 mg by mouth 2 (two) times daily.   Yes [provider]  isosorbide mononitrate (IMDUR) 30 MG 24 hr tablet Take 1 tablet (30 mg total) by mouth daily. 03/24/15  Yes Dhungel, Nishant, MD  Lidocaine (HM LIDOCAINE PATCH) 4 % PTCH Apply 1 patch topically daily. Take off after 12 hours   Yes [provider]  loperamide (IMODIUM A-D) 2 MG tablet Take 2 mg by mouth 4 (four) times daily as needed for diarrhea or loose stools.   Yes [provider]  loratadine (CLARITIN) 10 MG tablet Take 10 mg by mouth daily.   Yes [provider]  magnesium hydroxide (MILK OF MAGNESIA) 400 MG/5ML suspension Take 30 mLs by mouth at bedtime as needed for mild constipation.   Yes [provider]  meclizine (ANTIVERT) 25 MG tablet Take 1 tablet (25 mg total) by mouth 3 (three) times daily as needed for dizziness. 2/35/36  Yes Delora Fuel, MD  montelukast (SINGULAIR) 10 MG tablet Take 10 mg by mouth daily.    Yes [provider]  ondansetron (ZOFRAN) 4 MG tablet Take 1 tablet (4 mg total) by mouth every 6 (six) hours as needed for nausea or vomiting. 1/44/31  Yes Delora Fuel, MD  polyethylene glycol [facility] / Floria Raveling) packet Take 17 g by mouth every other day. Mix with 8 ounces of fluid and drink   Yes [provider]  sodium  chloride (OCEAN) 0.65 % SOLN nasal spray Place 1 spray into both nostrils as needed for congestion.   Yes [provider]  traMADol (ULTRAM) 50 MG tablet Take 50 mg by mouth 3 (three) times daily.   Yes [provider]  albuterol (PROVENTIL) (2.5 MG/3ML) 0.083% nebulizer solution Take 3 mLs (2.5 mg total) by nebulization every 4 (four) hours as needed for wheezing. Patient not taking: Reported on 04/18/2019 04/28/15   Geradine Girt, DO  Fluticasone-Salmeterol (ADVAIR) 500-50 MCG/DOSE AEPB Inhale 1 puff into the lungs 2 (two) times daily. Rinse mouth after use    [provider]  levothyroxine (SYNTHROID, LEVOTHROID) 50 MCG tablet Take 50 mcg by mouth daily.  01/16/15   [provider]  potassium chloride SA (K-DUR,KLOR-CON) 20 MEQ tablet Take 20 mEq by mouth daily.     [provider]   Physical Exam: Blood pressure (!) 161/75, pulse 90, temperature 99.9 F (37.7 C), temperature source Oral, resp. rate 12, height 5\' 6"  (1.676 m), weight 98 kg, SpO2 100 %. 1. General:  in No  Acute distress   Chronically ill -appearing 2. Psychological: Alert and   Oriented 3. Head/ENT:     Dry Mucous Membranes                          Head Non traumatic, neck supple                           Poor Dentition 4. SKIN:   decreased Skin turgor,  Skin clean Dry and intact no rash 5. Heart: Regular rate and rhythm no  Murmur, no Rub or gallop 6. Lungs:  Clear to auscultation bilaterally, no wheezes or crackles   7. Abdomen: Soft, non-tender,   Distended bowel sounds diminished, colostomy in place, NG tube in place 8. Lower extremities: no clubbing, cyanosis, no  edema 9. Neurologically Grossly intact, moving all 4 extremities equally   10. MSK: Normal range of motion  All other LABS:     Recent Labs  Lab 04/18/19 1648  WBC 9.1  HGB 10.4*  HCT 34.4*  MCV 95.3  PLT 253     Recent Labs  Lab 04/18/19 1648  NA 140  K 4.1  CL 97*  CO2 32  GLUCOSE 104*  BUN  28*  CREATININE 1.46*  CALCIUM 10.6*     Recent Labs  Lab 04/18/19 1648  AST 15  ALT 18  ALKPHOS 70  BILITOT 0.4  PROT 7.6  ALBUMIN 4.0       Cultures:    Component Value Date/Time   SDES  11/28/2017 2036    BLOOD RIGHT HAND Performed at [facility], [place] [address] [place], [place] [address]    SPECREQUEST  11/28/2017 2036    IN PEDIATRIC BOTTLE Blood Culture adequate volume Performed at [facility] [address], [place], [place] [address]    CULT  11/28/2017 2036    NO GROWTH 5 DAYS Performed at [facility] [address], [place], [place] [address]    REPTSTATUS 12/04/2017 FINAL 11/28/2017 2036     Radiological Exams on Admission: Ct Abdomen Pelvis Wo Contrast  Result Date: 04/18/2019 CLINICAL DATA:  Abdominal pain and distention, nausea vomiting for 2 days. No fevers, no chills. Extensive surgical history. EXAM: CT ABDOMEN AND PELVIS WITHOUT CONTRAST TECHNIQUE: Multidetector CT imaging of the abdomen and pelvis was performed following the standard protocol without IV contrast. COMPARISON:  CT abdomen pelvis 08/11/2018, 06/23/2018 FINDINGS: Lower chest: AICD leads positioned in the right atrium and cardiac apex. There is cardiomegaly with biatrial enlargement as well as mitral annular calcification and coronary artery calcifications. There are bibasilar areas of subsegmental atelectasis and/or scarring. Hepatobiliary: No visible or contour deforming and [place] lesions. Dependently layering calcified gallstones are present within the otherwise normal gallbladder. No biliary ductal dilatation. No calcified intraductal gallstones. Pancreas: Unremarkable. No pancreatic ductal dilatation or surrounding inflammatory changes. Spleen: Normal in size without focal abnormality. Adrenals/Urinary Tract: Adrenal glands are unremarkable. There is new mild right hydroureteronephrosis without visualized calcification along the course of the  right ureter. Vascular calcifications are noted in both renal hila. No focal lesion or hydronephrosis. Bladder is unremarkable. Stomach/Bowel: Postsurgical changes from prior proctocolectomy with left lower quadrant end colostomy formation. Small hiatal hernia. Stomach is distended with ingested material. There is extensive air in fluid dilatation of the proximal small bowel. Portion of the distended small bowel enters into a peristomal hernia sac in the left lower quadrant however the exiting bowel also appears distended with a second tapering transition point identified in the right lower quadrant (series 3, image 64) where the distal small bowel beyond this point is largely decompressed. The cecum is displaced into the low midline pelvis. Fecal material is present throughout the remaining portions of the colon. Vascular/Lymphatic: Aortic atherosclerosis. No enlarged abdominal or pelvic lymph nodes. No portal venous gas or air within the splanchnic draining vessels. Reproductive: Post hysterectomy.  No concerning adnexal lesions. Other: Parastomal hernia detailed above. And anterior abdominal wall laxity likely related to multiple prior surgical procedures. No abdominopelvic ascites. Musculoskeletal: No acute or significant osseous findings. Stable appearance of the L1 superior endplate compression deformity when compared to most recent CT. IMPRESSION: 1. Postsurgical changes from proctocolectomy with end colostomy in the left lower quadrant. 2. Small-bowel obstruction with narrowing at the entry of the previously described parastomal hernia which appears only partially obstructive on today's exam with dilated loops beyond  the hernia sac. A more distal transition point seen in the right lower quadrant, as detailed above. No evidence of pneumatosis. No free air or free fluid. 3. New mild right hydroureteronephrosis without visualized calcification along the course of the right ureter, which may reflect a recently  passed stone. 4. Cholelithiasis without evidence of acute cholecystitis. 5. Coronary and aortic atherosclerosis. 6. Cardiomegaly with biatrial enlargement. These results were called by telephone at the time of interpretation on 04/18/2019 at 7:53 pm to Dr. Theotis Burrow , who verbally acknowledged these results. Electronically Signed   By: MD Lovena Le   On: 04/18/2019 19:54    Chart has been reviewed    Assessment/Plan  85 y.o. female with medical history significant of small bowel obstruction CKD gout, peripheral vertigo recurrent recurrent parastomal hernia at left lower quadrant colostomy, history of rectal cancer, dementia, COPD, chronic systolic and diastolicHF ,statuspost ICD, hypertension, hypothyroidism, hyperlipidemia Admitted for SBO  Present on Admission:  . SBO (small bowel obstruction) (HCC) -  Likely cause  Adhesions,   - admit for conservative management  - NG tube in place - NPO - KUB in AM - appreciate General surgery consult.  . Benign essential HTN - hold home meds while NPO  . COPD (chronic obstructive pulmonary disease) ([place]) -  stable continue home medications   . Dementia ([place]) monitor for any sign of sundowning . ICD in place -chronic stable . Dyslipidemia  - hold home meds while NPO  . CKD (chronic kidney disease) stage 3, GFR 30-59 ml/min (HCC) -avoid nephrotoxic medications currently at baseline . Anemia related to chronic CKD -currently at baseline   . Chronic combined systolic and diastolic CHF (congestive heart failure) ([place]) hold off on Lasix given fluid down resume as needed currently appears to be on the dry side . Recurrent parastomal hernia at LLQ colostomy appreciate general surgery input Keep n.p.o.  AKI -mild likely secondary to dehydration we will hold Lasix and continue to monitor Other plan as per orders.  DVT prophylaxis:  SCD    Code Status:  FULL CODE   as per patient   I had personally discussed CODE STATUS with patient    Family Communication:   Family not at  Bedside    Disposition Plan:                              Back to current facility when stable                                                Would benefit from PT/OT eval prior to DC  Ordered                                      Social Work  consulted                    Consults called: general Surgery  Admission status:  ED Disposition    ED Disposition Condition [place]: [place] [[id number]]  Level of Care: Med-Surg [16]  Covid Evaluation: Confirmed COVID Negative  Diagnosis: SBO (small bowel obstruction) [facility]) [[id number]]  Admitting Physician: Toy Baker [[id number]]  Attending Physician: Toy Baker [  [id number]]  Estimated length of stay: 3 - 4 days  Certification:: I certify this patient will need inpatient services for at least 2 midnights  PT Class (Do Not Modify): Inpatient [101]  PT Acc Code (Do Not Modify): Private [1]          inpatient     Expect 2 midnight stay secondary to severity of patient's current illness including      Severe lab/radiological/exam abnormalities including:  SBO   and extensive comorbidities including:      CHF    COPD    CKD  dementia    history of    malignancy,   That are currently affecting medical management.   I expect  patient to be hospitalized for 2 midnights requiring inpatient medical care.  Patient is at high risk for adverse outcome (such as loss of life or disability) if not treated.  Indication for inpatient stay as follows:    inability to maintain oral hydration    Need for operative/procedural  intervention    Need for   IV fluids,  IV pain medications      Level of care      medical floor    Precautions:  NONE  No active isolations  PPE: Used by the provider:   P100  eye Goggles,  Gloves       Corydon Schweiss 04/18/2019, 10:43 PM    [facility]     after 2 AM please page floor coverage PA  If 7AM-7PM, please contact the day team taking care of the patient using [email or web address]

## 2019-04-18 NOTE — ED Notes (Signed)
ED TO INPATIENT HANDOFF REPORT  ED Nurse Name and Phone #: Noni Saupe Name/Age/Gender [name] 85 y.o. female Room/Bed: WA05/WA05  Code Status   Code Status: Prior  Home/SNF/Other Given to floor Patient oriented to: self, place, time and situation Is this baseline? Yes   Triage Complete: Triage complete  Chief Complaint abd pain; emesis  Triage Note Per EMS, Pt is coming from [facility]. Pt is complaining of abd pain, abd distention, and n/V 2x days. Last emesis episode yesterday. Denies constipation, last bm in colostomy bag last night, no fevers or chills.    Allergies No Known Allergies  Level of Care/Admitting Diagnosis ED Disposition    ED Disposition Condition Comment   Admit  Hospital Area: [facility] [[id number]]  Level of Care: Med-Surg [16]  Covid Evaluation: Confirmed COVID Negative  Diagnosis: SBO (small bowel obstruction) ([place]) [[id number]]  Admitting Physician: Toy Baker [[id number]]  Attending Physician: Toy Baker [[id number]]  Estimated length of stay: 3 - 4 days  Certification:: I certify this patient will need inpatient services for at least 2 midnights  PT Class (Do Not Modify): Inpatient [101]  PT Acc Code (Do Not Modify): Private [1]       B Medical/Surgery History Past Medical History:  Diagnosis Date  . Anginal pain ([place])   . Arthritis    "comes and goes" (03/01/2015)  . Asthma   . CHF (congestive heart failure) ([place])   . Colon cancer ([place])   . Colostomy care ([place])   . COPD (chronic obstructive pulmonary disease) ([place])   . Diabetes mellitus without complication ([place])    pt denies this hx on 03/01/2015  . HCAP (healthcare-associated pneumonia) 04/22/2017  . Heart murmur   . History of blood transfusion 03/01/2015; 04/23/2017   "related to anemia,"  . Hyperlipidemia   . Hypertension   . Hypothyroidism   . Pneumonia    "just once" (03/01/2015)  . Presence of combination internal cardiac  defibrillator (ICD) and pacemaker    2005 in [facility]  . Pulmonary embolism ([place])   . Renal disorder    Past Surgical History:  Procedure Laterality Date  . ABDOMINAL HYSTERECTOMY    . ABDOMINOPERINEAL PROCTOCOLECTOMY  2006   Rectal cancer  . ANKLE FRACTURE SURGERY Left   . CARDIAC CATHETERIZATION N/A 03/22/2015   Procedure: Right/Left Heart Cath and Coronary Angiography;  Surgeon: Leonie Man, MD;  Location: [facility];  Service: Cardiovascular;  Laterality: N/A;  . CATARACT EXTRACTION W/ INTRAOCULAR LENS  IMPLANT, BILATERAL Bilateral   . CHOLECYSTECTOMY    . DILATION AND CURETTAGE OF UTERUS    . FRACTURE SURGERY    . PARASTOMAL HERNIA REPAIR  2007   lysis of adhesions  . TUBAL LIGATION       A IV Location/Drains/Wounds Patient Lines/Drains/Airways Status   Active Line/Drains/Airways    Name:   Placement date:   Placement time:   Site:   Days:   Peripheral IV 04/18/19 Left Antecubital   04/18/19    1514    Antecubital   less than 1   Colostomy LUQ   -    -    LUQ      External Urinary Catheter   08/24/18    0822    -   237          Intake/Output Last 24 hours  Intake/Output Summary (Last 24 hours) at 04/18/2019 2241 Last data filed at 04/18/2019 2204 Gross per 24  hour  Intake 1000 ml  Output -  Net 1000 ml    Labs/Imaging Results for orders placed or performed during the hospital encounter of 04/18/19 (from the past 48 hour(s))  Lipase, blood     Status: None   Collection Time: 04/18/19  4:48 PM  Result Value Ref Range   Lipase 21 11 - 51 U/L    Comment: Performed at [facility], [place] [address], [place], [place] [address]  Comprehensive metabolic panel     Status: Abnormal   Collection Time: 04/18/19  4:48 PM  Result Value Ref Range   Sodium 140 135 - 145 mmol/L   Potassium 4.1 3.5 - 5.1 mmol/L   Chloride 97 (L) 98 - 111 mmol/L   CO2 32 22 - 32 mmol/L   Glucose, Bld 104 (H) 70 - 99 mg/dL   BUN 28 (H) 8 - 23 mg/dL   Creatinine,  Ser 1.46 (H) 0.44 - 1.00 mg/dL   Calcium 10.6 (H) 8.9 - 10.3 mg/dL   Total Protein 7.6 6.5 - 8.1 g/dL   Albumin 4.0 3.5 - 5.0 g/dL   AST 15 15 - 41 U/L   ALT 18 0 - 44 U/L   Alkaline Phosphatase 70 38 - 126 U/L   Total Bilirubin 0.4 0.3 - 1.2 mg/dL   GFR calc non Af Amer 33 (L) >60 mL/min   GFR calc Af Amer 38 (L) >60 mL/min   Anion gap 11 5 - 15    Comment: Performed at [facility], [place] [address], [place], [place] [address]  CBC     Status: Abnormal   Collection Time: 04/18/19  4:48 PM  Result Value Ref Range   WBC 9.1 4.0 - 10.5 K/uL   RBC 3.61 (L) 3.87 - 5.11 MIL/uL   Hemoglobin 10.4 (L) 12.0 - 15.0 g/dL   HCT 34.4 (L) 36.0 - 46.0 %   MCV 95.3 80.0 - 100.0 fL   MCH 28.8 26.0 - 34.0 pg   MCHC 30.2 30.0 - 36.0 g/dL   RDW 14.8 11.5 - 15.5 %   Platelets 253 150 - 400 K/uL   nRBC 0.0 0.0 - 0.2 %    Comment: Performed at [facility], [place] [address], [place], [place] [address]  Urinalysis, Routine w reflex microscopic     Status: Abnormal   Collection Time: 04/18/19  4:48 PM  Result Value Ref Range   Color, Urine YELLOW YELLOW   APPearance HAZY (A) CLEAR   Specific Gravity, Urine 1.012 1.005 - 1.030   pH 5.0 5.0 - 8.0   Glucose, UA NEGATIVE NEGATIVE mg/dL   Hgb urine dipstick NEGATIVE NEGATIVE   Bilirubin Urine NEGATIVE NEGATIVE   Ketones, ur NEGATIVE NEGATIVE mg/dL   Protein, ur NEGATIVE NEGATIVE mg/dL   Nitrite NEGATIVE NEGATIVE   Leukocytes,Ua TRACE (A) NEGATIVE   RBC / HPF 0-5 0 - 5 RBC/hpf   WBC, UA 0-5 0 - 5 WBC/hpf   Bacteria, UA RARE (A) NONE SEEN   Squamous Epithelial / LPF 0-5 0 - 5    Comment: Performed at [facility], [place] [address], [place], [place] [address]  Lactic acid, plasma     Status: None   Collection Time: 04/18/19  4:48 PM  Result Value Ref Range   Lactic Acid, Venous 0.9 0.5 - 1.9 mmol/L    Comment: Performed at [facility], [place] [address] [place], [place] [address]   SARS Coronavirus 2 (  CEPHEID - Performed in [place] hospital lab), Hosp Order     Status: None   Collection Time: 04/18/19  4:48 PM   Specimen: Urine, Clean Catch; Nasopharyngeal  Result Value Ref Range   SARS Coronavirus 2 NEGATIVE NEGATIVE    Comment: (NOTE) If result is NEGATIVE SARS-CoV-2 target nucleic acids are NOT DETECTED. The SARS-CoV-2 RNA is generally detectable in upper and lower  respiratory specimens during the acute phase of infection. The lowest  concentration of SARS-CoV-2 viral copies this assay can detect is 250  copies / mL. A negative result does not preclude SARS-CoV-2 infection  and should not be used as the sole basis for treatment or other  patient management decisions.  A negative result may occur with  improper specimen collection / handling, submission of specimen other  than nasopharyngeal swab, presence of viral mutation(s) within the  areas targeted by this assay, and inadequate number of viral copies  (<250 copies / mL). A negative result must be combined with clinical  observations, patient history, and epidemiological information. If result is POSITIVE SARS-CoV-2 target nucleic acids are DETECTED. The SARS-CoV-2 RNA is generally detectable in upper and lower  respiratory specimens dur ing the acute phase of infection.  Positive  results are indicative of active infection with SARS-CoV-2.  Clinical  correlation with patient history and other diagnostic information is  necessary to determine patient infection status.  Positive results do  not rule out bacterial infection or co-infection with other viruses. If result is PRESUMPTIVE POSTIVE SARS-CoV-2 nucleic acids MAY BE PRESENT.   A presumptive positive result was obtained on the submitted specimen  and confirmed on repeat testing.  While 2019 novel coronavirus  (SARS-CoV-2) nucleic acids may be present in the submitted sample  additional confirmatory testing may be necessary for epidemiological   and / or clinical management purposes  to differentiate between  SARS-CoV-2 and other Sarbecovirus currently known to infect humans.  If clinically indicated additional testing with an alternate test  methodology [phone]) is advised. The SARS-CoV-2 RNA is generally  detectable in upper and lower respiratory sp ecimens during the acute  phase of infection. The expected result is Negative. Fact Sheet for Patients:  StrictlyIdeas.no Fact Sheet for Healthcare Providers: BankingDealers.co.za This test is not yet approved or cleared by the [place] FDA and has been authorized for detection and/or diagnosis of SARS-CoV-2 by FDA under an Emergency Use Authorization (EUA).  This EUA will remain in effect (meaning this test can be used) for the duration of the COVID-19 declaration under Section 564(b)(1) of the Act, 21 U.S.C. section 360bbb-3(b)(1), unless the authorization is terminated or revoked sooner. Performed at [facility], [place] [address], [place], [place] [address]    Ct Abdomen Pelvis Wo Contrast  Result Date: 04/18/2019 CLINICAL DATA:  Abdominal pain and distention, nausea vomiting for 2 days. No fevers, no chills. Extensive surgical history. EXAM: CT ABDOMEN AND PELVIS WITHOUT CONTRAST TECHNIQUE: Multidetector CT imaging of the abdomen and pelvis was performed following the standard protocol without IV contrast. COMPARISON:  CT abdomen pelvis 08/11/2018, 06/23/2018 FINDINGS: Lower chest: AICD leads positioned in the right atrium and cardiac apex. There is cardiomegaly with biatrial enlargement as well as mitral annular calcification and coronary artery calcifications. There are bibasilar areas of subsegmental atelectasis and/or scarring. Hepatobiliary: No visible or contour deforming and [place] lesions. Dependently layering calcified gallstones are present within the otherwise normal gallbladder. No biliary ductal  dilatation. No calcified intraductal gallstones. Pancreas: Unremarkable. No  pancreatic ductal dilatation or surrounding inflammatory changes. Spleen: Normal in size without focal abnormality. Adrenals/Urinary Tract: Adrenal glands are unremarkable. There is new mild right hydroureteronephrosis without visualized calcification along the course of the right ureter. Vascular calcifications are noted in both renal hila. No focal lesion or hydronephrosis. Bladder is unremarkable. Stomach/Bowel: Postsurgical changes from prior proctocolectomy with left lower quadrant end colostomy formation. Small hiatal hernia. Stomach is distended with ingested material. There is extensive air in fluid dilatation of the proximal small bowel. Portion of the distended small bowel enters into a peristomal hernia sac in the left lower quadrant however the exiting bowel also appears distended with a second tapering transition point identified in the right lower quadrant (series 3, image 64) where the distal small bowel beyond this point is largely decompressed. The cecum is displaced into the low midline pelvis. Fecal material is present throughout the remaining portions of the colon. Vascular/Lymphatic: Aortic atherosclerosis. No enlarged abdominal or pelvic lymph nodes. No portal venous gas or air within the splanchnic draining vessels. Reproductive: Post hysterectomy.  No concerning adnexal lesions. Other: Parastomal hernia detailed above. And anterior abdominal wall laxity likely related to multiple prior surgical procedures. No abdominopelvic ascites. Musculoskeletal: No acute or significant osseous findings. Stable appearance of the L1 superior endplate compression deformity when compared to most recent CT. IMPRESSION: 1. Postsurgical changes from proctocolectomy with end colostomy in the left lower quadrant. 2. Small-bowel obstruction with narrowing at the entry of the previously described parastomal hernia which appears only  partially obstructive on today's exam with dilated loops beyond the hernia sac. A more distal transition point seen in the right lower quadrant, as detailed above. No evidence of pneumatosis. No free air or free fluid. 3. New mild right hydroureteronephrosis without visualized calcification along the course of the right ureter, which may reflect a recently passed stone. 4. Cholelithiasis without evidence of acute cholecystitis. 5. Coronary and aortic atherosclerosis. 6. Cardiomegaly with biatrial enlargement. These results were called by telephone at the time of interpretation on 04/18/2019 at 7:53 pm to Dr. Theotis Burrow , who verbally acknowledged these results. Electronically Signed   By: MD Lovena Le   On: 04/18/2019 19:54    Pending Labs Unresulted Labs (From admission, onward)    Start     Ordered   04/18/19 1551  Lactic acid, plasma  Now then every 2 hours,   STAT     04/18/19 1550          Vitals/Pain Today's Vitals   04/18/19 1730 04/18/19 1830 04/18/19 1930 04/18/19 2130  BP: (!) 153/68  (!) 161/75 (!) 152/73  Pulse: 94  90 94  Resp: 13  12 16   Temp:      TempSrc:      SpO2: 100%  100% 94%  Weight:      Height:      PainSc:  0-No pain      Isolation Precautions No active isolations  Medications Medications  diatrizoate meglumine-sodium (GASTROGRAFIN) 66-10 % solution 90 mL (has no administration in time range)  ondansetron (ZOFRAN) injection 4 mg (4 mg Intravenous Given 04/18/19 1640)  fentaNYL (SUBLIMAZE) injection 100 mcg (100 mcg Intravenous Given 04/18/19 1640)  sodium chloride 0.9 % bolus 1,000 mL (0 mLs Intravenous Stopped 04/18/19 2204)    Mobility non-ambulatory Low fall risk   Focused Assessments GI assessment   R Recommendations: See Admitting Provider Note  Report given to:   Additional Notes:

## 2019-04-18 NOTE — ED Triage Notes (Signed)
Per EMS, Pt is coming from [facility]. Pt is complaining of abd pain, abd distention, and n/V 2x days. Last emesis episode yesterday. Denies constipation, last bm in colostomy bag last night, no fevers or chills.

## 2019-04-18 NOTE — ED Provider Notes (Signed)
[facility] DEPT Provider Note   CSN: [id number] Arrival date & time: 04/18/19  1459     History   Chief Complaint Chief Complaint  Patient presents with   Abdominal Pain    HPI [name] is a 85 y.o. female.     85 year old female with extensive past medical history below including colon cancer status post colostomy, COPD, CHF, PE, hypertension, hyperlipidemia, type 2 diabetes mellitus who presents with abdominal pain and vomiting.  Patient states that early this week she began having generalized abdominal pain associated with nausea and vomiting.  Episode of vomiting was last night.  Her last bowel movement was last night.  She reports pain is generalized and constant and she endorses abdominal distention.  Pain is worse when she takes a deep breath then.  No urinary symptoms.  She denies any fevers, cough/cold symptoms.  The history is provided by the patient.  Abdominal Pain   Past Medical History:  Diagnosis Date   Anginal pain ([place])    Arthritis    "comes and goes" (03/01/2015)   Asthma    CHF (congestive heart failure) (HCC)    Colon cancer (HCC)    Colostomy care ([place])    COPD (chronic obstructive pulmonary disease) ([place])    Diabetes mellitus without complication ([place])    pt denies this hx on 03/01/2015   HCAP (healthcare-associated pneumonia) 04/22/2017   Heart murmur    History of blood transfusion 03/01/2015; 04/23/2017   "related to anemia,"   Hyperlipidemia    Hypertension    Hypothyroidism    Pneumonia    "just once" (03/01/2015)   Presence of combination internal cardiac defibrillator (ICD) and pacemaker    2005 in [facility]   Pulmonary embolism ([place])    Renal disorder     Patient Active Problem List   Diagnosis Date Noted   Personal history of gout 08/28/2018   Recurrent parastomal hernia at [facility] colostomy 08/21/2018   SBO (small bowel obstruction) ([place]) 08/21/2018   Chest pain 10/28/2017    Aspiration of liquid 10/28/2017   Bilateral lower extremity edema    Pneumonia 04/22/2017   Altered mental status    Hypotension 02/16/2017   Syncope 02/02/2016   Faintness    Essential hypertension 11/16/2015   Hypothyroidism 11/16/2015   Acute on chronic systolic CHF (congestive heart failure) ([place]) 11/15/2015   Hypertensive heart disease 10/04/2015   Elevated troponin 10/04/2015   Acute respiratory failure (HCC)    Acute systolic congestive heart failure (HCC)    NICM (nonischemic cardiomyopathy) ([place]) 08/11/2015   Normal coronary arteries 08/11/2015   Dyslipidemia 07/27/2015   CKD (chronic kidney disease) stage 3, GFR 30-59 ml/min (HCC) 07/27/2015   Anemia 07/27/2015   Benign essential HTN 07/27/2015   FUO (fever of unknown origin) 07/27/2015   IDA (iron deficiency anemia) 62/70/3500   Chronic systolic CHF (congestive heart failure) ([place]) 07/27/2015   Rectal cancer ypTypN0 (0/13) s/p chemoXRT/ APR/colostomy 2006 07/27/2015   Sepsis ([place]) 07/24/2015   Acute encephalopathy 07/24/2015   Hypokalemia 07/24/2015   Dementia ([place]) 07/23/2015   COPD (chronic obstructive pulmonary disease) ([place]) 04/25/2015   ICD in place 04/14/2015   Colostomy in place [facility]) 03/01/2015   Pacemaker 11/07/2012   Anxiety 10/04/2011    Past Surgical History:  Procedure Laterality Date   ABDOMINAL HYSTERECTOMY     ABDOMINOPERINEAL PROCTOCOLECTOMY  2006   Rectal cancer   ANKLE FRACTURE SURGERY Left    CARDIAC CATHETERIZATION N/A 03/22/2015  Procedure: Right/Left Heart Cath and Coronary Angiography;  Surgeon: Leonie Man, MD;  Location: [facility];  Service: Cardiovascular;  Laterality: N/A;   CATARACT EXTRACTION W/ INTRAOCULAR LENS  IMPLANT, BILATERAL Bilateral    CHOLECYSTECTOMY     DILATION AND CURETTAGE OF UTERUS     FRACTURE SURGERY     PARASTOMAL HERNIA REPAIR  2007   lysis of adhesions   TUBAL LIGATION       OB History   No obstetric  history on file.      Home Medications    Prior to Admission medications   Medication Sig Start Date End Date Taking? Authorizing Provider  acetaminophen (TYLENOL) 500 MG tablet Take 1,000 mg by mouth 3 (three) times daily.    Yes [provider]  allopurinol (ZYLOPRIM) 100 MG tablet Take 100 mg by mouth daily.   Yes [provider]  albuterol (PROVENTIL) (2.5 MG/3ML) 0.083% nebulizer solution Take 3 mLs (2.5 mg total) by nebulization every 4 (four) hours as needed for wheezing. 04/28/15   Geradine Girt, DO  aspirin EC 81 MG tablet Take 1 tablet (81 mg total) by mouth every morning. 03/03/15   Rai, Ripudeep K, MD  atorvastatin (LIPITOR) 20 MG tablet Take 20 mg by mouth daily.     [provider]  carboxymethylcellulose (REFRESH) 1 % ophthalmic solution Place 1 drop into both eyes 3 (three) times daily.     [provider]  carvedilol (COREG) 6.25 MG tablet Take 6.25 mg by mouth 2 (two) times daily with a meal.     [provider]  cyclobenzaprine (FLEXERIL) 5 MG tablet Take 5 mg by mouth 2 (two) times daily as needed for muscle spasms.    [provider]  cyclobenzaprine (FLEXERIL) 5 MG tablet Take 5 mg by mouth at bedtime.    [provider]  Fluticasone-Salmeterol (ADVAIR) 500-50 MCG/DOSE AEPB Inhale 1 puff into the lungs 2 (two) times daily. Rinse mouth after use    [provider]  furosemide (LASIX) 20 MG tablet Take 20 mg by mouth daily as needed (weight gain of more >3 lbs in 24 hours).     [provider]  furosemide (LASIX) 40 MG tablet Take 1 tablet (40 mg total) by mouth daily. 12/30/15   Shirley Friar, PA-C  hydrALAZINE (APRESOLINE) 25 MG tablet Take 0.5 tablets (12.5 mg total) by mouth 3 (three) times daily. 12/23/15   Clegg, Amy D, NP  iron polysaccharides (NIFEREX) 150 MG capsule Take 150 mg by mouth 2 (two) times daily.    [provider]  isosorbide mononitrate (IMDUR) 30 MG 24 hr  tablet Take 1 tablet (30 mg total) by mouth daily. 03/24/15   Dhungel, Nishant, MD  levothyroxine (SYNTHROID, LEVOTHROID) 50 MCG tablet Take 50 mcg by mouth daily.  01/16/15   [provider]  loratadine (CLARITIN) 10 MG tablet Take 10 mg by mouth daily.    [provider]  meclizine (ANTIVERT) 25 MG tablet Take 1 tablet (25 mg total) by mouth 3 (three) times daily as needed for dizziness. 6/31/49   Delora Fuel, MD  Menthol-Methyl Salicylate (ICY HOT) 70-26 % STCK Apply 1 application topically 2 (two) times daily as needed (knee pain.).    [provider]  montelukast (SINGULAIR) 10 MG tablet Take 10 mg by mouth daily.     [provider]  ondansetron (ZOFRAN) 4 MG tablet Take 1 tablet (4 mg total) by mouth every 6 (six) hours  as needed for nausea or vomiting. 0/93/26   Delora Fuel, MD  oxymetazoline (AFRIN) 0.05 % nasal spray Place 1 spray into both nostrils 2 (two) times daily as needed (for nose bleeds). Reported on 12/23/2015    [provider]  polyethylene glycol (MIRALAX / GLYCOLAX) packet Take 17 g by mouth every other day. Mix with 8 ounces of fluid and drink    [provider]  potassium chloride SA (K-DUR,KLOR-CON) 20 MEQ tablet Take 20 mEq by mouth daily.     [provider]  ranitidine (ZANTAC) 150 MG tablet Take 150 mg by mouth daily.    [provider]    Family History Family History  Problem Relation Age of Onset   CAD Other    Hypertension Other    Stroke Other    Colon cancer Neg Hx    GI Bleed Neg Hx     Social History Social History   Tobacco Use   Smoking status: Never Smoker   Smokeless tobacco: Never Used  Substance Use Topics   Alcohol use: No   Drug use: No     Allergies   Patient has no known allergies.   Review of Systems Review of Systems  Gastrointestinal: Positive for abdominal pain.   All other systems reviewed and are negative except that which was mentioned in  HPI   Physical Exam Updated Vital Signs BP (!) 185/79    Pulse 96    Temp 99.9 F (37.7 C) (Oral)    Resp 14    Ht 5\' 6"  (1.676 m)    Wt 98 kg    SpO2 96% Comment: Simultaneous filing. User may not have seen previous data.   BMI 34.86 kg/m   Physical Exam Vitals signs and nursing note reviewed.  Constitutional:      General: She is not in acute distress.    Appearance: She is well-developed.     Comments: Chronically ill appearing  HENT:     Head: Normocephalic and atraumatic.  Eyes:     Conjunctiva/sclera: Conjunctivae normal.  Neck:     Musculoskeletal: Neck supple.  Cardiovascular:     Rate and Rhythm: Normal rate and regular rhythm.     Heart sounds: Normal heart sounds. No murmur.  Pulmonary:     Effort: Pulmonary effort is normal.     Breath sounds: Normal breath sounds.  Abdominal:     General: Bowel sounds are increased. There is distension.     Palpations: Abdomen is rigid.     Tenderness: There is generalized abdominal tenderness. There is no guarding or rebound.     Comments: Colostomy bag empty  Skin:    General: Skin is warm and dry.  Neurological:     Mental Status: She is alert and oriented to person, place, and time.     Comments: Fluent speech  Psychiatric:        Judgment: Judgment normal.      ED Treatments / Results  Labs (all labs ordered are listed, but only abnormal results are displayed) Labs Reviewed  COMPREHENSIVE METABOLIC PANEL - Abnormal; Notable for the following components:      Result Value   Chloride 97 (*)    Glucose, Bld 104 (*)    BUN 28 (*)    Creatinine, Ser 1.46 (*)    Calcium 10.6 (*)    GFR calc non Af Amer 33 (*)    GFR calc Af Amer 38 (*)    All other components  within normal limits  CBC - Abnormal; Notable for the following components:   RBC 3.61 (*)    Hemoglobin 10.4 (*)    HCT 34.4 (*)    All other components within normal limits  URINALYSIS, ROUTINE W REFLEX MICROSCOPIC - Abnormal; Notable for the following  components:   APPearance HAZY (*)    Leukocytes,Ua TRACE (*)    Bacteria, UA RARE (*)    All other components within normal limits  SARS CORONAVIRUS 2 (HOSPITAL ORDER, PERFORMED IN [facility])  LIPASE, BLOOD  LACTIC ACID, PLASMA  LACTIC ACID, PLASMA    EKG None  Radiology Ct Abdomen Pelvis Wo Contrast  Result Date: 04/18/2019 CLINICAL DATA:  Abdominal pain and distention, nausea vomiting for 2 days. No fevers, no chills. Extensive surgical history. EXAM: CT ABDOMEN AND PELVIS WITHOUT CONTRAST TECHNIQUE: Multidetector CT imaging of the abdomen and pelvis was performed following the standard protocol without IV contrast. COMPARISON:  CT abdomen pelvis 08/11/2018, 06/23/2018 FINDINGS: Lower chest: AICD leads positioned in the right atrium and cardiac apex. There is cardiomegaly with biatrial enlargement as well as mitral annular calcification and coronary artery calcifications. There are bibasilar areas of subsegmental atelectasis and/or scarring. Hepatobiliary: No visible or contour deforming and P[place] lesions. Dependently layering calcified gallstones are present within the otherwise normal gallbladder. No biliary ductal dilatation. No calcified intraductal gallstones. Pancreas: Unremarkable. No pancreatic ductal dilatation or surrounding inflammatory changes. Spleen: Normal in size without focal abnormality. Adrenals/Urinary Tract: Adrenal glands are unremarkable. There is new mild right hydroureteronephrosis without visualized calcification along the course of the right ureter. Vascular calcifications are noted in both renal hila. No focal lesion or hydronephrosis. Bladder is unremarkable. Stomach/Bowel: Postsurgical changes from prior proctocolectomy with left lower quadrant end colostomy formation. Small hiatal hernia. Stomach is distended with ingested material. There is extensive air in fluid dilatation of the proximal small bowel. Portion of the distended small bowel enters  into a peristomal hernia sac in the left lower quadrant however the exiting bowel also appears distended with a second tapering transition point identified in the right lower quadrant (series 3, image 64) where the distal small bowel beyond this point is largely decompressed. The cecum is displaced into the low midline pelvis. Fecal material is present throughout the remaining portions of the colon. Vascular/Lymphatic: Aortic atherosclerosis. No enlarged abdominal or pelvic lymph nodes. No portal venous gas or air within the splanchnic draining vessels. Reproductive: Post hysterectomy.  No concerning adnexal lesions. Other: Parastomal hernia detailed above. And anterior abdominal wall laxity likely related to multiple prior surgical procedures. No abdominopelvic ascites. Musculoskeletal: No acute or significant osseous findings. Stable appearance of the L1 superior endplate compression deformity when compared to most recent CT. IMPRESSION: 1. Postsurgical changes from proctocolectomy with end colostomy in the left lower quadrant. 2. Small-bowel obstruction with narrowing at the entry of the previously described parastomal hernia which appears only partially obstructive on today's exam with dilated loops beyond the hernia sac. A more distal transition point seen in the right lower quadrant, as detailed above. No evidence of pneumatosis. No free air or free fluid. 3. New mild right hydroureteronephrosis without visualized calcification along the course of the right ureter, which may reflect a recently passed stone. 4. Cholelithiasis without evidence of acute cholecystitis. 5. Coronary and aortic atherosclerosis. 6. Cardiomegaly with biatrial enlargement. These results were called by telephone at the time of interpretation on 04/18/2019 at 7:53 pm to Dr. Theotis Burrow , who verbally acknowledged these results. Electronically  Signed   By: MD Lovena Le   On: 04/18/2019 19:54    Procedures Procedures (including  critical care time)  Medications Ordered in ED Medications  ondansetron (ZOFRAN) injection 4 mg (has no administration in time range)  fentaNYL (SUBLIMAZE) injection 100 mcg (has no administration in time range)     Initial Impression / Assessment and Plan / ED Course  I have reviewed the triage vital signs and the nursing notes.  Pertinent labs & imaging results that were available during my care of the patient were reviewed by me and considered in my medical decision making (see chart for details).       Non-toxic but distended w/ generalized abd tenderness on exam. Hypertensive, afebrile. DDx includes obstruction, bowel perforation. Obtained labs and CT abd/pelvis.  Lab work shows creatinine 1.46, normal WBC count, normal LFTs and lipase.  Normal lactate and COVID-19 test negative.  CT shows small bowel obstruction with transition point in right lower quadrant, discussed findings with radiologist.  She has mild hydronephrosis on the right but no obvious stone, may have passed one recently.  Discussed with general surgery, Dr. Harlow Asa, who will see pt in consultation.  Discussed admission with [facility], Dr. Roel Cluck, and pt admitted for further care.  Final Clinical Impressions(s) / ED Diagnoses   Final diagnoses:  Small bowel obstruction due to adhesions [facility])    ED Discharge Orders    None       Keyonni Percival, Wenda Overland, MD 04/18/19 2216

## 2019-04-19 ENCOUNTER — Other Ambulatory Visit: Payer: Self-pay

## 2019-04-19 ENCOUNTER — Inpatient Hospital Stay (HOSPITAL_COMMUNITY): Payer: Medicare HMO

## 2019-04-19 DIAGNOSIS — K565 Intestinal adhesions [bands], unspecified as to partial versus complete obstruction: Principal | ICD-10-CM

## 2019-04-19 LAB — CBC
HCT: 38.6 % (ref 36.0–46.0)
Hemoglobin: 11.7 g/dL — ABNORMAL LOW (ref 12.0–15.0)
MCH: 28.3 pg (ref 26.0–34.0)
MCHC: 30.3 g/dL (ref 30.0–36.0)
MCV: 93.5 fL (ref 80.0–100.0)
Platelets: 247 10*3/uL (ref 150–400)
RBC: 4.13 MIL/uL (ref 3.87–5.11)
RDW: 14.9 % (ref 11.5–15.5)
WBC: 10.7 10*3/uL — ABNORMAL HIGH (ref 4.0–10.5)
nRBC: 0 % (ref 0.0–0.2)

## 2019-04-19 LAB — COMPREHENSIVE METABOLIC PANEL
ALT: 15 U/L (ref 0–44)
AST: 17 U/L (ref 15–41)
Albumin: 3.8 g/dL (ref 3.5–5.0)
Alkaline Phosphatase: 71 U/L (ref 38–126)
Anion gap: 13 (ref 5–15)
BUN: 28 mg/dL — ABNORMAL HIGH (ref 8–23)
CO2: 30 mmol/L (ref 22–32)
Calcium: 10.6 mg/dL — ABNORMAL HIGH (ref 8.9–10.3)
Chloride: 100 mmol/L (ref 98–111)
Creatinine, Ser: 1.29 mg/dL — ABNORMAL HIGH (ref 0.44–1.00)
GFR calc Af Amer: 44 mL/min — ABNORMAL LOW (ref >60)
GFR calc non Af Amer: 38 mL/min — ABNORMAL LOW (ref >60)
Glucose, Bld: 91 mg/dL (ref 70–99)
Potassium: 4.3 mmol/L (ref 3.5–5.1)
Sodium: 143 mmol/L (ref 135–145)
Total Bilirubin: 0.6 mg/dL (ref 0.3–1.2)
Total Protein: 7.7 g/dL (ref 6.5–8.1)

## 2019-04-19 LAB — PHOSPHORUS: Phosphorus: 4.3 mg/dL (ref 2.5–4.6)

## 2019-04-19 LAB — TSH: TSH: 1.118 u[IU]/mL (ref 0.350–4.500)

## 2019-04-19 LAB — MAGNESIUM: Magnesium: 2 mg/dL (ref 1.7–2.4)

## 2019-04-19 MED ORDER — HYDRALAZINE HCL 20 MG/ML IJ SOLN
10.0000 mg | Freq: Three times a day (TID) | INTRAMUSCULAR | Status: DC
  Administered 2019-04-19 – 2019-04-26 (×21): 10 mg via INTRAVENOUS
  Filled 2019-04-19 (×19): qty 1

## 2019-04-19 MED ORDER — LACTATED RINGERS IV SOLN
INTRAVENOUS | Status: AC
  Administered 2019-04-19: 21:00:00 via INTRAVENOUS

## 2019-04-19 NOTE — Progress Notes (Signed)
Assessment & Plan: HD#2 - small bowel obstruction  NG tube placed and confirmed on AXR  Small bowel obstruction protocol ordered and started  Will follow with you - hope to avoid operative intervention        Armandina Gemma, MD       [facility]       Office: [phone]   Chief Complaint: SBO  Subjective: Patient in bed, appears comfortable.  No output from colostomy.  NG bilious.  Objective: Vital signs in last 24 hours: Temp:  [97.3 F (36.3 C)-99.9 F (37.7 C)] 97.3 F (36.3 C) (07/11 0646) Pulse Rate:  [90-102] 97 (07/11 0646) Resp:  [12-18] 18 (07/11 0646) BP: (147-185)/(63-86) 160/80 (07/11 0646) SpO2:  [89 %-100 %] 94 % (07/11 0646) Weight:  [98 kg] 98 kg (07/10 1509) Last BM Date: 04/17/19  Intake/Output from previous day: 07/10 0701 - 07/11 0700 In: 1312 [I.V.:312; IV Piggyback:1000] Out: 400 [Urine:400] Intake/Output this shift: No intake/output data recorded.  Physical Exam: HEENT - sclerae clear, mucous membranes moist Neck - soft Chest - clear bilaterally Cor - RRR Abdomen - protuberant, distended; BS present; NG bilious; ostomy without output overnight   Lab Results:  Recent Labs    04/18/19 1648 04/19/19 0505  WBC 9.1 10.7*  HGB 10.4* 11.7*  HCT 34.4* 38.6  PLT 253 247   BMET Recent Labs    04/18/19 1648 04/19/19 0505  NA 140 143  K 4.1 4.3  CL 97* 100  CO2 32 30  GLUCOSE 104* 91  BUN 28* 28*  CREATININE 1.46* 1.29*  CALCIUM 10.6* 10.6*   PT/INR No results for input(s): LABPROT, INR in the last 72 hours. Comprehensive Metabolic Panel:    Component Value Date/Time   NA 143 04/19/2019 0505   NA 140 04/18/2019 1648   K 4.3 04/19/2019 0505   K 4.1 04/18/2019 1648   CL 100 04/19/2019 0505   CL 97 (L) 04/18/2019 1648   CO2 30 04/19/2019 0505   CO2 32 04/18/2019 1648   BUN 28 (H) 04/19/2019 0505   BUN 28 (H) 04/18/2019 1648   CREATININE 1.29 (H) 04/19/2019 0505   CREATININE 1.46 (H) 04/18/2019 1648     CREATININE 0.83 04/14/2015 1514   GLUCOSE 91 04/19/2019 0505   GLUCOSE 104 (H) 04/18/2019 1648   CALCIUM 10.6 (H) 04/19/2019 0505   CALCIUM 10.6 (H) 04/18/2019 1648   AST 17 04/19/2019 0505   AST 15 04/18/2019 1648   ALT 15 04/19/2019 0505   ALT 18 04/18/2019 1648   ALKPHOS 71 04/19/2019 0505   ALKPHOS 70 04/18/2019 1648   BILITOT 0.6 04/19/2019 0505   BILITOT 0.4 04/18/2019 1648   PROT 7.7 04/19/2019 0505   PROT 7.6 04/18/2019 1648   ALBUMIN 3.8 04/19/2019 0505   ALBUMIN 4.0 04/18/2019 1648    Studies/Results: Ct Abdomen Pelvis Wo Contrast  Result Date: 04/18/2019 CLINICAL DATA:  Abdominal pain and distention, nausea vomiting for 2 days. No fevers, no chills. Extensive surgical history. EXAM: CT ABDOMEN AND PELVIS WITHOUT CONTRAST TECHNIQUE: Multidetector CT imaging of the abdomen and pelvis was performed following the standard protocol without IV contrast. COMPARISON:  CT abdomen pelvis 08/11/2018, 06/23/2018 FINDINGS: Lower chest: AICD leads positioned in the right atrium and cardiac apex. There is cardiomegaly with biatrial enlargement as well as mitral annular calcification and coronary artery calcifications. There are bibasilar areas of subsegmental atelectasis and/or scarring. Hepatobiliary: No visible or contour deforming and P[place]ock lesions. Dependently layering calcified  gallstones are present within the otherwise normal gallbladder. No biliary ductal dilatation. No calcified intraductal gallstones. Pancreas: Unremarkable. No pancreatic ductal dilatation or surrounding inflammatory changes. Spleen: Normal in size without focal abnormality. Adrenals/Urinary Tract: Adrenal glands are unremarkable. There is new mild right hydroureteronephrosis without visualized calcification along the course of the right ureter. Vascular calcifications are noted in both renal hila. No focal lesion or hydronephrosis. Bladder is unremarkable. Stomach/Bowel: Postsurgical changes from prior  proctocolectomy with left lower quadrant end colostomy formation. Small hiatal hernia. Stomach is distended with ingested material. There is extensive air in fluid dilatation of the proximal small bowel. Portion of the distended small bowel enters into a peristomal hernia sac in the left lower quadrant however the exiting bowel also appears distended with a second tapering transition point identified in the right lower quadrant (series 3, image 64) where the distal small bowel beyond this point is largely decompressed. The cecum is displaced into the low midline pelvis. Fecal material is present throughout the remaining portions of the colon. Vascular/Lymphatic: Aortic atherosclerosis. No enlarged abdominal or pelvic lymph nodes. No portal venous gas or air within the splanchnic draining vessels. Reproductive: Post hysterectomy.  No concerning adnexal lesions. Other: Parastomal hernia detailed above. And anterior abdominal wall laxity likely related to multiple prior surgical procedures. No abdominopelvic ascites. Musculoskeletal: No acute or significant osseous findings. Stable appearance of the L1 superior endplate compression deformity when compared to most recent CT. IMPRESSION: 1. Postsurgical changes from proctocolectomy with end colostomy in the left lower quadrant. 2. Small-bowel obstruction with narrowing at the entry of the previously described parastomal hernia which appears only partially obstructive on today's exam with dilated loops beyond the hernia sac. A more distal transition point seen in the right lower quadrant, as detailed above. No evidence of pneumatosis. No free air or free fluid. 3. New mild right hydroureteronephrosis without visualized calcification along the course of the right ureter, which may reflect a recently passed stone. 4. Cholelithiasis without evidence of acute cholecystitis. 5. Coronary and aortic atherosclerosis. 6. Cardiomegaly with biatrial enlargement. These results were  called by telephone at the time of interpretation on 04/18/2019 at 7:53 pm to Dr. Theotis Burrow , who verbally acknowledged these results. Electronically Signed   By: MD Lovena Le   On: 04/18/2019 19:54   Dg Abd Portable 1v-small Bowel Protocol-position Verification  Result Date: 04/18/2019 CLINICAL DATA:  Check nasogastric catheter placement EXAM: PORTABLE ABDOMEN - 1 VIEW COMPARISON:  None. FINDINGS: Scattered large and small bowel gas is noted. Some retained fecal material is seen. Gastric catheter is noted extending into the stomach. Proximal side port lies at the gastroesophageal junction. This could be advanced a few more cm deeper into the stomach. IMPRESSION: Gastric catheter as described. This could be advanced a few more cm deeper into the stomach. Electronically Signed   By: Inez Catalina M.D.   On: 04/18/2019 22:55      Armandina Gemma 04/19/2019  Patient ID: [name], female   DOB: [birth year], 85 y.o.   MRN: [id number]

## 2019-04-19 NOTE — Progress Notes (Signed)
PROGRESS NOTE    [name]   GBT:[id number]  DOB: 05-16-1934  DOA: 04/18/2019 PCP: [name], PA   Brief Narrative:  [name] is a 85 y.o. 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    Subjective: She has no complaints.     Assessment & Plan:   Principal Problem:   SBO (small bowel obstruction)  - appreciate management per general surgery - on slow IVF and has NG tube  Active Problems:   Colostomy in place - h/o rectal cancer - no stool in colostomy     CKD (chronic kidney disease) stage 3, GFR 30-59 ml/min - Cr is stable    Anemia - chronic- Hb is up from baseline at 11.7    Benign essential HTN - start IV Hydralazine TID, cont PRN Hydralazine     Chronic combined systolic and diastolic CHF (congestive heart failure) (HCC) - holding Lasix for now- follow closely for fluid overload    ICD in place    Dementia   Time spent in minutes: 35  DVT prophylaxis: SCDS Code Status: Full code Family Communication:  Disposition Plan: f/u on SBO Consultants:   General surgery Procedures:   none Antimicrobials:  Anti-infectives (From admission, onward)   None       Objective: Vitals:   04/19/19 0058 04/19/19 0153 04/19/19 0646 04/19/19 1020  BP: (!) 147/63 (!) 150/65 (!) 160/80 (!) 155/65  Pulse: (!) 102 97 97 98  Resp:  18 18 16   Temp:  98.7 F (37.1 C) (!) 97.3 F (36.3 C) 98.6 F (37 C)  TempSrc:  Oral Oral Oral  SpO2: 93% 95% 94% 98%  Weight:      Height:        Intake/Output Summary (Last 24 hours) at 04/19/2019 1151 Last data filed at 04/19/2019 1021 Gross per 24 hour  Intake 1312.03 ml  Output 800 ml  Net 512.03 ml   Filed Weights   04/18/19 1509  Weight: 98 kg     Examination: General exam: Appears comfortable  HEENT: PERRLA, oral mucosa moist, no sclera icterus or thrush Respiratory system: Clear to auscultation. Respiratory effort normal. Cardiovascular system: S1 & S2 heard, RRR.   Gastrointestinal system: Abdomen soft, non-tender, nondistended. Normal bowel sounds. Central nervous system: Alert and oriented only to person. No focal neurological deficits. Extremities: No cyanosis, clubbing or edema Skin: No rashes or ulcers Psychiatry:  Mood & affect appropriate.     Data Reviewed: I have personally reviewed following labs and imaging studies  CBC: Recent Labs  Lab 04/18/19 1648 04/19/19 0505  WBC 9.1 10.7*  HGB 10.4* 11.7*  HCT 34.4* 38.6  MCV 95.3 93.5  PLT 253 710   Basic Metabolic Panel: Recent Labs  Lab 04/18/19 1648 04/19/19 0505  NA 140 143  K 4.1 4.3  CL 97* 100  CO2 32 30  GLUCOSE 104* 91  BUN 28* 28*  CREATININE 1.46* 1.29*  CALCIUM 10.6* 10.6*  MG  --  2.0  PHOS  --  4.3   GFR: Estimated Creatinine Clearance: 39 mL/min (A) (by C-G formula based on SCr of 1.29 mg/dL (H)). Liver Function Tests: Recent Labs  Lab 04/18/19 1648 04/19/19 0505  AST 15 17  ALT 18 15  ALKPHOS 70 71  BILITOT 0.4 0.6  PROT  7.6 7.7  ALBUMIN 4.0 3.8   Recent Labs  Lab 04/18/19 1648  LIPASE 21   No results for input(s): AMMONIA in the last 168 hours. Coagulation Profile: No results for input(s): INR, PROTIME in the last 168 hours. Cardiac Enzymes: No results for input(s): CKTOTAL, CKMB, CKMBINDEX, TROPONINI in the last 168 hours. BNP (last 3 results) No results for input(s): PROBNP in the last 8760 hours. HbA1C: No results for input(s): HGBA1C in the last 72 hours. CBG: No results for input(s): GLUCAP in the last 168 hours. Lipid Profile: No results for input(s): CHOL, HDL, LDLCALC, TRIG, CHOLHDL, LDLDIRECT in the last 72 hours. Thyroid Function Tests: Recent Labs    04/19/19 0505  TSH 1.118   Anemia Panel: No  results for input(s): VITAMINB12, FOLATE, FERRITIN, TIBC, IRON, RETICCTPCT in the last 72 hours. Urine analysis:    Component Value Date/Time   COLORURINE YELLOW 04/18/2019 1648   APPEARANCEUR HAZY (A) 04/18/2019 1648   LABSPEC 1.012 04/18/2019 1648   PHURINE 5.0 04/18/2019 1648   GLUCOSEU NEGATIVE 04/18/2019 1648   HGBUR NEGATIVE 04/18/2019 1648   BILIRUBINUR NEGATIVE 04/18/2019 1648   KETONESUR NEGATIVE 04/18/2019 1648   PROTEINUR NEGATIVE 04/18/2019 1648   UROBILINOGEN 0.2 07/23/2015 1610   NITRITE NEGATIVE 04/18/2019 1648   LEUKOCYTESUR TRACE (A) 04/18/2019 1648   Sepsis Labs: @LABRCNTIP (procalcitonin:4,lacticidven:4) ) Recent Results (from the past 240 hour(s))  SARS Coronavirus 2 (CEPHEID - Performed in [place] hospital lab), Hosp Order     Status: None   Collection Time: 04/18/19  4:48 PM   Specimen: Urine, Clean Catch; Nasopharyngeal  Result Value Ref Range Status   SARS Coronavirus 2 NEGATIVE NEGATIVE Final    Comment: (NOTE) If result is NEGATIVE SARS-CoV-2 target nucleic acids are NOT DETECTED. The SARS-CoV-2 RNA is generally detectable in upper and lower  respiratory specimens during the acute phase of infection. The lowest  concentration of SARS-CoV-2 viral copies this assay can detect is 250  copies / mL. A negative result does not preclude SARS-CoV-2 infection  and should not be used as the sole basis for treatment or other  patient management decisions.  A negative result may occur with  improper specimen collection / handling, submission of specimen other  than nasopharyngeal swab, presence of viral mutation(s) within the  areas targeted by this assay, and inadequate number of viral copies  (<250 copies / mL). A negative result must be combined with clinical  observations, patient history, and epidemiological information. If result is POSITIVE SARS-CoV-2 target nucleic acids are DETECTED. The SARS-CoV-2 RNA is generally detectable in upper and lower   respiratory specimens dur ing the acute phase of infection.  Positive  results are indicative of active infection with SARS-CoV-2.  Clinical  correlation with patient history and other diagnostic information is  necessary to determine patient infection status.  Positive results do  not rule out bacterial infection or co-infection with other viruses. If result is PRESUMPTIVE POSTIVE SARS-CoV-2 nucleic acids MAY BE PRESENT.   A presumptive positive result was obtained on the submitted specimen  and confirmed on repeat testing.  While 2019 novel coronavirus  (SARS-CoV-2) nucleic acids may be present in the submitted sample  additional confirmatory testing may be necessary for epidemiological  and / or clinical management purposes  to differentiate between  SARS-CoV-2 and other Sarbecovirus currently known to infect humans.  If clinically indicated additional testing with an alternate test  methodology [phone]) is advised. The SARS-CoV-2 RNA is generally  detectable in upper  and lower respiratory sp ecimens during the acute  phase of infection. The expected result is Negative. Fact Sheet for Patients:  StrictlyIdeas.no Fact Sheet for Healthcare Providers: BankingDealers.co.za This test is not yet approved or cleared by the [place] FDA and has been authorized for detection and/or diagnosis of SARS-CoV-2 by FDA under an Emergency Use Authorization (EUA).  This EUA will remain in effect (meaning this test can be used) for the duration of the COVID-19 declaration under Section 564(b)(1) of the Act, 21 U.S.C. section 360bbb-3(b)(1), unless the authorization is terminated or revoked sooner. Performed at [facility], [place] [address] [place], [place] [address]          Radiology Studies: Ct Abdomen Pelvis Wo Contrast  Result Date: 04/18/2019 CLINICAL DATA:  Abdominal pain and distention, nausea vomiting for 2  days. No fevers, no chills. Extensive surgical history. EXAM: CT ABDOMEN AND PELVIS WITHOUT CONTRAST TECHNIQUE: Multidetector CT imaging of the abdomen and pelvis was performed following the standard protocol without IV contrast. COMPARISON:  CT abdomen pelvis 08/11/2018, 06/23/2018 FINDINGS: Lower chest: AICD leads positioned in the right atrium and cardiac apex. There is cardiomegaly with biatrial enlargement as well as mitral annular calcification and coronary artery calcifications. There are bibasilar areas of subsegmental atelectasis and/or scarring. Hepatobiliary: No visible or contour deforming and [place] lesions. Dependently layering calcified gallstones are present within the otherwise normal gallbladder. No biliary ductal dilatation. No calcified intraductal gallstones. Pancreas: Unremarkable. No pancreatic ductal dilatation or surrounding inflammatory changes. Spleen: Normal in size without focal abnormality. Adrenals/Urinary Tract: Adrenal glands are unremarkable. There is new mild right hydroureteronephrosis without visualized calcification along the course of the right ureter. Vascular calcifications are noted in both renal hila. No focal lesion or hydronephrosis. Bladder is unremarkable. Stomach/Bowel: Postsurgical changes from prior proctocolectomy with left lower quadrant end colostomy formation. Small hiatal hernia. Stomach is distended with ingested material. There is extensive air in fluid dilatation of the proximal small bowel. Portion of the distended small bowel enters into a peristomal hernia sac in the left lower quadrant however the exiting bowel also appears distended with a second tapering transition point identified in the right lower quadrant (series 3, image 64) where the distal small bowel beyond this point is largely decompressed. The cecum is displaced into the low midline pelvis. Fecal material is present throughout the remaining portions of the colon. Vascular/Lymphatic: Aortic  atherosclerosis. No enlarged abdominal or pelvic lymph nodes. No portal venous gas or air within the splanchnic draining vessels. Reproductive: Post hysterectomy.  No concerning adnexal lesions. Other: Parastomal hernia detailed above. And anterior abdominal wall laxity likely related to multiple prior surgical procedures. No abdominopelvic ascites. Musculoskeletal: No acute or significant osseous findings. Stable appearance of the L1 superior endplate compression deformity when compared to most recent CT. IMPRESSION: 1. Postsurgical changes from proctocolectomy with end colostomy in the left lower quadrant. 2. Small-bowel obstruction with narrowing at the entry of the previously described parastomal hernia which appears only partially obstructive on today's exam with dilated loops beyond the hernia sac. A more distal transition point seen in the right lower quadrant, as detailed above. No evidence of pneumatosis. No free air or free fluid. 3. New mild right hydroureteronephrosis without visualized calcification along the course of the right ureter, which may reflect a recently passed stone. 4. Cholelithiasis without evidence of acute cholecystitis. 5. Coronary and aortic atherosclerosis. 6. Cardiomegaly with biatrial enlargement. These results were called by telephone at the time of interpretation on 04/18/2019  at 7:53 pm to Dr. Theotis Burrow , who verbally acknowledged these results. Electronically Signed   By: MD Lovena Le   On: 04/18/2019 19:54   Dg Abd Portable 1v-small Bowel Obstruction Protocol-initial, 8 Hr Delay  Result Date: 04/19/2019 CLINICAL DATA:  Small bowel obstruction. EXAM: PORTABLE ABDOMEN - 1 VIEW COMPARISON:  04/18/2019 and CT 04/18/2019 FINDINGS: Two images are labeled as 8 hour images. There is a nasogastric tube in the proximal stomach region. There is contrast in the stomach. There continues to be dilated loops of small bowel throughout the abdomen particularly on the left side.  Evidence for surgical mesh in the left lower abdomen. There may be a small amount of oral contrast in the left abdomen but difficult to evaluate due to large amount of stool. Patient has a known left colostomy and parastomal hernia. IMPRESSION: Persistent dilated loops of small bowel. Majority of the contrast is in the stomach. Findings remain compatible with a small bowel obstruction. Electronically Signed   By: Markus Daft M.D.   On: 04/19/2019 09:34   Dg Abd Portable 1v-small Bowel Protocol-position Verification  Result Date: 04/18/2019 CLINICAL DATA:  Check nasogastric catheter placement EXAM: PORTABLE ABDOMEN - 1 VIEW COMPARISON:  None. FINDINGS: Scattered large and small bowel gas is noted. Some retained fecal material is seen. Gastric catheter is noted extending into the stomach. Proximal side port lies at the gastroesophageal junction. This could be advanced a few more cm deeper into the stomach. IMPRESSION: Gastric catheter as described. This could be advanced a few more cm deeper into the stomach. Electronically Signed   By: Inez Catalina M.D.   On: 04/18/2019 22:55      Scheduled Meds: . hydrALAZINE  10 mg Intravenous Q8H  . mometasone-formoterol  2 puff Inhalation BID   Continuous Infusions:   LOS: 1 day      Debbe Odea, MD [facility] Pager: [email or web address] Password [facility] 04/19/2019, 11:51 AM

## 2019-04-20 MED ORDER — METOPROLOL TARTRATE 5 MG/5ML IV SOLN: 5.0000 mg | INTRAVENOUS | Status: DC | PRN

## 2019-04-20 NOTE — Progress Notes (Signed)
Initial Nutrition Assessment  RD working remotely.   DOCUMENTATION CODES:   Not applicable  INTERVENTION:  - diet advancement as medically feasible.    NUTRITION DIAGNOSIS:   Inadequate oral intake related to inability to eat as evidenced by NPO status.  GOAL:   Patient will meet greater than or equal to 90% of their needs  MONITOR:   Diet advancement, Labs, Weight trends, I & O's  REASON FOR ASSESSMENT:   Malnutrition Screening Tool  ASSESSMENT:   85 y.o. 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 Presented with abdominal distention, abdominal pain, and N/V x2 days. Patient has colostomy bag and continues to have bowel movements in the colostomy.  Patient has been NPO since admission. NGT placed 7:10 at 12:55 AM and flow sheet/LDA avatar indicates 400 ml output during night shift. Per chart review, current weight is 216 lb and weight had been fairly stable (206-212 lb) from 01/2018-09/2018. Patient indicated that appetite had been good/at baseline until the 2 days PTA when symptoms started. She was unsure if her weight had changed at all in the past few months.   Per notes: - SBO with plan to continue conservative management only at this time - no stool in colostomy since admission - chronic anemia - CHF and currently holding lasix with plan to closely monitor fluid status   Medications reviewed. Labs reviewed; BUN: 28 mg/dl, creatinine: 1.29 mg/dl, Ca: 10.6 mg/dl, GFR: 38 ml/min.         NUTRITION - FOCUSED PHYSICAL EXAM:  unable to complete at this time.   Diet Order:   Diet Order            Diet NPO time specified  Diet effective now              EDUCATION NEEDS:   Not appropriate for education at this time  Skin:  Skin Assessment: Reviewed RN  Assessment  Last BM:  7/9 (PTA)  Height:   Ht Readings from Last 1 Encounters:  04/18/19 5\' 6"  (1.676 m)    Weight:   Wt Readings from Last 1 Encounters:  04/18/19 98 kg    Ideal Body Weight:  59.1 kg  BMI:  Body mass index is 34.86 kg/m.  Estimated Nutritional Needs:   Kcal:  1765-1960 kcal  Protein:  65-75 grams  Fluid:  >/= 2 L/day     Jarome Matin, MS, RD, LDN, [facility] Inpatient Clinical Dietitian Pager # [phone] After hours/weekend pager # [phone]

## 2019-04-20 NOTE — Progress Notes (Signed)
PROGRESS NOTE    [name]   JJK:[id number]  DOB: 06-02-1934  DOA: 04/18/2019 PCP: [name], PA   Brief Narrative:  [name] is a 85 y.o. 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    Subjective: She is asking for solid food today. She has no complaints otherwise.    Assessment & Plan:   Principal Problem:   SBO (small bowel obstruction)  - appreciate management per general surgery - on slow IVF and has NG tube- she is not yet improving  Active Problems:   Colostomy in place - h/o rectal cancer - no stool in colostomy yet     CKD (chronic kidney disease) stage 3, GFR 30-59 ml/min - Cr is slightly better than when first admitted    Anemia - chronic- stable    Benign essential HTN - cont IV Hydralazine TID, cont PRN Hydralazine - BP still quite high-  started PRN Lopressor - BP better today     Chronic combined systolic and diastolic CHF (congestive heart failure) (HCC) - holding Lasix for now- follow closely for fluid overload    ICD in place    Dementia - has mild confusion   Time spent in minutes: 35  DVT prophylaxis: SCDS Code Status: Full code Family Communication:  Disposition Plan: f/u on SBO Consultants:   General surgery Procedures:   none Antimicrobials:  Anti-infectives (From admission, onward)   None       Objective: Vitals:   04/20/19 0618 04/20/19 0622 04/20/19 1127 04/20/19 1325  BP: (!) 178/75   (!) 164/66  Pulse: (!) 111 87    Resp: 18     Temp: 97.8 F (36.6 C)     TempSrc: Oral     SpO2: 97% 95% 98%   Weight:      Height:        Intake/Output Summary (Last 24 hours) at 04/20/2019 1347 Last data filed at 04/20/2019 1000 Gross per 24 hour  Intake 400 ml    Output 1925 ml  Net -1525 ml   Filed Weights   04/18/19 1509  Weight: 98 kg    Examination: General exam: Appears comfortable  HEENT: PERRLA, oral mucosa moist, no sclera icterus or thrush Respiratory system: Clear to auscultation. Respiratory effort normal. Cardiovascular system: S1 & S2 heard,  No murmurs  Gastrointestinal system: Abdomen soft, non-tender, mildly distended. Poor bowel sounds- NG draining dark brown liquid Central nervous system: Alert and oriented x 2 only . No focal neurological deficits. Extremities: No cyanosis, clubbing or edema Skin: No rashes or ulcers Psychiatry: appears depressed    Data Reviewed: I have personally reviewed following labs and imaging studies  CBC: Recent Labs  Lab 04/18/19 1648 04/19/19 0505  WBC 9.1 10.7*  HGB 10.4* 11.7*  HCT 34.4* 38.6  MCV 95.3 93.5  PLT 253 371   Basic Metabolic Panel: Recent Labs  Lab 04/18/19 1648 04/19/19 0505  NA 140 143  K 4.1 4.3  CL 97* 100  CO2 32 30  GLUCOSE 104* 91  BUN 28* 28*  CREATININE 1.46* 1.29*  CALCIUM 10.6* 10.6*  MG  --  2.0  PHOS  --  4.3   GFR: Estimated Creatinine Clearance: 39 mL/min (A) (by C-G formula based on SCr of 1.29 mg/dL (H)). Liver Function Tests: Recent  Labs  Lab 04/18/19 1648 04/19/19 0505  AST 15 17  ALT 18 15  ALKPHOS 70 71  BILITOT 0.4 0.6  PROT 7.6 7.7  ALBUMIN 4.0 3.8   Recent Labs  Lab 04/18/19 1648  LIPASE 21   No results for input(s): AMMONIA in the last 168 hours. Coagulation Profile: No results for input(s): INR, PROTIME in the last 168 hours. Cardiac Enzymes: No results for input(s): CKTOTAL, CKMB, CKMBINDEX, TROPONINI in the last 168 hours. BNP (last 3 results) No results for input(s): PROBNP in the last 8760 hours. HbA1C: No results for input(s): HGBA1C in the last 72 hours. CBG: No results for input(s): GLUCAP in the last 168 hours. Lipid Profile: No results for input(s): CHOL, HDL, LDLCALC, TRIG, CHOLHDL, LDLDIRECT in  the last 72 hours. Thyroid Function Tests: Recent Labs    04/19/19 0505  TSH 1.118   Anemia Panel: No results for input(s): VITAMINB12, FOLATE, FERRITIN, TIBC, IRON, RETICCTPCT in the last 72 hours. Urine analysis:    Component Value Date/Time   COLORURINE YELLOW 04/18/2019 1648   APPEARANCEUR HAZY (A) 04/18/2019 1648   LABSPEC 1.012 04/18/2019 1648   PHURINE 5.0 04/18/2019 1648   GLUCOSEU NEGATIVE 04/18/2019 1648   HGBUR NEGATIVE 04/18/2019 1648   BILIRUBINUR NEGATIVE 04/18/2019 1648   KETONESUR NEGATIVE 04/18/2019 1648   PROTEINUR NEGATIVE 04/18/2019 1648   UROBILINOGEN 0.2 07/23/2015 1610   NITRITE NEGATIVE 04/18/2019 1648   LEUKOCYTESUR TRACE (A) 04/18/2019 1648   Sepsis Labs: @LABRCNTIP (procalcitonin:4,lacticidven:4) ) Recent Results (from the past 240 hour(s))  SARS Coronavirus 2 (CEPHEID - Performed in [place] hospital lab), Hosp Order     Status: None   Collection Time: 04/18/19  4:48 PM   Specimen: Urine, Clean Catch; Nasopharyngeal  Result Value Ref Range Status   SARS Coronavirus 2 NEGATIVE NEGATIVE Final    Comment: (NOTE) If result is NEGATIVE SARS-CoV-2 target nucleic acids are NOT DETECTED. The SARS-CoV-2 RNA is generally detectable in upper and lower  respiratory specimens during the acute phase of infection. The lowest  concentration of SARS-CoV-2 viral copies this assay can detect is 250  copies / mL. A negative result does not preclude SARS-CoV-2 infection  and should not be used as the sole basis for treatment or other  patient management decisions.  A negative result may occur with  improper specimen collection / handling, submission of specimen other  than nasopharyngeal swab, presence of viral mutation(s) within the  areas targeted by this assay, and inadequate number of viral copies  (<250 copies / mL). A negative result must be combined with clinical  observations, patient history, and epidemiological information. If result is  POSITIVE SARS-CoV-2 target nucleic acids are DETECTED. The SARS-CoV-2 RNA is generally detectable in upper and lower  respiratory specimens dur ing the acute phase of infection.  Positive  results are indicative of active infection with SARS-CoV-2.  Clinical  correlation with patient history and other diagnostic information is  necessary to determine patient infection status.  Positive results do  not rule out bacterial infection or co-infection with other viruses. If result is PRESUMPTIVE POSTIVE SARS-CoV-2 nucleic acids MAY BE PRESENT.   A presumptive positive result was obtained on the submitted specimen  and confirmed on repeat testing.  While 2019 novel coronavirus  (SARS-CoV-2) nucleic acids may be present in the submitted sample  additional confirmatory testing may be necessary for epidemiological  and / or clinical management purposes  to differentiate between  SARS-CoV-2 and other Sarbecovirus currently known to infect  humans.  If clinically indicated additional testing with an alternate test  methodology [phone]) is advised. The SARS-CoV-2 RNA is generally  detectable in upper and lower respiratory sp ecimens during the acute  phase of infection. The expected result is Negative. Fact Sheet for Patients:  StrictlyIdeas.no Fact Sheet for Healthcare Providers: BankingDealers.co.za This test is not yet approved or cleared by the [place] FDA and has been authorized for detection and/or diagnosis of SARS-CoV-2 by FDA under an Emergency Use Authorization (EUA).  This EUA will remain in effect (meaning this test can be used) for the duration of the COVID-19 declaration under Section 564(b)(1) of the Act, 21 U.S.C. section 360bbb-3(b)(1), unless the authorization is terminated or revoked sooner. Performed at [facility], [place] [address], [place], [place] [address]          Radiology Studies: Ct Abdomen  Pelvis Wo Contrast  Result Date: 04/18/2019 CLINICAL DATA:  Abdominal pain and distention, nausea vomiting for 2 days. No fevers, no chills. Extensive surgical history. EXAM: CT ABDOMEN AND PELVIS WITHOUT CONTRAST TECHNIQUE: Multidetector CT imaging of the abdomen and pelvis was performed following the standard protocol without IV contrast. COMPARISON:  CT abdomen pelvis 08/11/2018, 06/23/2018 FINDINGS: Lower chest: AICD leads positioned in the right atrium and cardiac apex. There is cardiomegaly with biatrial enlargement as well as mitral annular calcification and coronary artery calcifications. There are bibasilar areas of subsegmental atelectasis and/or scarring. Hepatobiliary: No visible or contour deforming and [place] lesions. Dependently layering calcified gallstones are present within the otherwise normal gallbladder. No biliary ductal dilatation. No calcified intraductal gallstones. Pancreas: Unremarkable. No pancreatic ductal dilatation or surrounding inflammatory changes. Spleen: Normal in size without focal abnormality. Adrenals/Urinary Tract: Adrenal glands are unremarkable. There is new mild right hydroureteronephrosis without visualized calcification along the course of the right ureter. Vascular calcifications are noted in both renal hila. No focal lesion or hydronephrosis. Bladder is unremarkable. Stomach/Bowel: Postsurgical changes from prior proctocolectomy with left lower quadrant end colostomy formation. Small hiatal hernia. Stomach is distended with ingested material. There is extensive air in fluid dilatation of the proximal small bowel. Portion of the distended small bowel enters into a peristomal hernia sac in the left lower quadrant however the exiting bowel also appears distended with a second tapering transition point identified in the right lower quadrant (series 3, image 64) where the distal small bowel beyond this point is largely decompressed. The cecum is displaced into the low  midline pelvis. Fecal material is present throughout the remaining portions of the colon. Vascular/Lymphatic: Aortic atherosclerosis. No enlarged abdominal or pelvic lymph nodes. No portal venous gas or air within the splanchnic draining vessels. Reproductive: Post hysterectomy.  No concerning adnexal lesions. Other: Parastomal hernia detailed above. And anterior abdominal wall laxity likely related to multiple prior surgical procedures. No abdominopelvic ascites. Musculoskeletal: No acute or significant osseous findings. Stable appearance of the L1 superior endplate compression deformity when compared to most recent CT. IMPRESSION: 1. Postsurgical changes from proctocolectomy with end colostomy in the left lower quadrant. 2. Small-bowel obstruction with narrowing at the entry of the previously described parastomal hernia which appears only partially obstructive on today's exam with dilated loops beyond the hernia sac. A more distal transition point seen in the right lower quadrant, as detailed above. No evidence of pneumatosis. No free air or free fluid. 3. New mild right hydroureteronephrosis without visualized calcification along the course of the right ureter, which may reflect a recently passed stone. 4. Cholelithiasis without evidence of  acute cholecystitis. 5. Coronary and aortic atherosclerosis. 6. Cardiomegaly with biatrial enlargement. These results were called by telephone at the time of interpretation on 04/18/2019 at 7:53 pm to Dr. Theotis Burrow , who verbally acknowledged these results. Electronically Signed   By: MD Lovena Le   On: 04/18/2019 19:54   Dg Abd Portable 1v-small Bowel Obstruction Protocol-initial, 8 Hr Delay  Result Date: 04/19/2019 CLINICAL DATA:  Small bowel obstruction. EXAM: PORTABLE ABDOMEN - 1 VIEW COMPARISON:  04/18/2019 and CT 04/18/2019 FINDINGS: Two images are labeled as 8 hour images. There is a nasogastric tube in the proximal stomach region. There is contrast in the  stomach. There continues to be dilated loops of small bowel throughout the abdomen particularly on the left side. Evidence for surgical mesh in the left lower abdomen. There may be a small amount of oral contrast in the left abdomen but difficult to evaluate due to large amount of stool. Patient has a known left colostomy and parastomal hernia. IMPRESSION: Persistent dilated loops of small bowel. Majority of the contrast is in the stomach. Findings remain compatible with a small bowel obstruction. Electronically Signed   By: Markus Daft M.D.   On: 04/19/2019 09:34   Dg Abd Portable 1v-small Bowel Protocol-position Verification  Result Date: 04/18/2019 CLINICAL DATA:  Check nasogastric catheter placement EXAM: PORTABLE ABDOMEN - 1 VIEW COMPARISON:  None. FINDINGS: Scattered large and small bowel gas is noted. Some retained fecal material is seen. Gastric catheter is noted extending into the stomach. Proximal side port lies at the gastroesophageal junction. This could be advanced a few more cm deeper into the stomach. IMPRESSION: Gastric catheter as described. This could be advanced a few more cm deeper into the stomach. Electronically Signed   By: Inez Catalina M.D.   On: 04/18/2019 22:55      Scheduled Meds:  hydrALAZINE  10 mg Intravenous Q8H   mometasone-formoterol  2 puff Inhalation BID   Continuous Infusions:   LOS: 2 days      Debbe Odea, MD [facility] Pager: [email or web address] Password [facility] 04/20/2019, 1:47 PM

## 2019-04-20 NOTE — Evaluation (Signed)
Physical Therapy Evaluation Patient Details Name: [name] MRN: [id number] DOB: [birth year] Today's Date: 04/20/2019   History of Present Illness  Pt is an 85 year old woman admitted 04/18/19 with vomiting x 2 days, + SBO. PMH: SBO, colostomy, rectal ca, CKD, gout, ICD, HTN, COPD, vertigo, dementia, CHF, gout.  Clinical Impression   Pt presents with generalized weakness, throat and bilateral knee pain, difficulty performing all mobility tasks, increased time and effort to mobilize, unsteadiness in standing, UE and LE edema, and decreased activity tolerance due to pain and fatigue. Pt to benefit from acute PT to address deficits. Pt required min-mod assist +2 to mobilize to recliner this session, unable to participate in ambulation due to fatigue and weakness. PT recommending SNF placement to address mobility deficits, as pt mobilizes with AD without physical assist at baseline.  PT to progress mobility as tolerated, and will continue to follow acutely.      Follow Up Recommendations SNF;Supervision/Assistance - 24 hour    Equipment Recommendations  None recommended by PT    Recommendations for Other Services       Precautions / Restrictions Precautions Precautions: Fall Precaution Comments: NGT Restrictions Weight Bearing Restrictions: No      Mobility  Bed Mobility Overal bed mobility: Needs Assistance Bed Mobility: Rolling;Sidelying to Sit Rolling: Mod assist Sidelying to sit: Mod assist;+2 for physical assistance       General bed mobility comments: used rail to self assist with rolling and use of pad, assist for LEs over EOB and to raise trunk. Pt sat EOB ~3 minutes without PT or OT support.  Transfers Overall transfer level: Needs assistance Equipment used: 2 person hand held assist Transfers: Sit to/from [facility] Sit to Stand: +2 physical assistance;Mod assist Stand pivot transfers: +2 physical assistance;Min assist       General transfer  comment: assist to rise and steady, pt took small pivotal steps from bed to chair  Ambulation/Gait                Stairs            Wheelchair Mobility    Modified Rankin (Stroke Patients Only)       Balance Overall balance assessment: Needs assistance   Sitting balance-Leahy Scale: Fair     Standing balance support: Bilateral upper extremity supported Standing balance-Leahy Scale: Poor                               Pertinent Vitals/Pain Pain Assessment: 0-10 Pain Score: 10-Worst pain ever Pain Location: throat Pain Descriptors / Indicators: Sore Pain Intervention(s): Monitored during session    Home Living Family/patient expects to be discharged to:: Assisted living               Home Equipment: Walker - 4 wheels;Shower seat;Grab bars - tub/shower;Grab bars - toilet      Prior Function Level of Independence: Needs assistance   Gait / Transfers Assistance Needed: pt reports walking with rollator  ADL's / Homemaking Assistance Needed: pt states she requires assist for dressing and bathing        Hand Dominance   Dominant Hand: Right    Extremity/Trunk Assessment   Upper Extremity Assessment Upper Extremity Assessment: Defer to OT evaluation    Lower Extremity Assessment Lower Extremity Assessment: Generalized weakness    Cervical / Trunk Assessment Cervical / Trunk Assessment: Other exceptions(forward head)  Communication   Communication: No difficulties(minimally  conversant)  Cognition Arousal/Alertness: Awake/alert Behavior During Therapy: WFL for tasks assessed/performed Overall Cognitive Status: History of cognitive impairments - at baseline                                 General Comments: pt with hx of dementia, following commands with increased time. Pt speaks minimally during session, mostly hums "mm-hmm" or "nuh-uh" to respond to PT/OT      General Comments General comments (skin integrity,  edema, etc.): Pt with swelling of all UEs and LEs, notably in R foot    Exercises     Assessment/Plan    PT Assessment Patient needs continued PT services  PT Problem List Decreased strength;Decreased mobility;Decreased range of motion;Decreased activity tolerance;Decreased balance;Decreased knowledge of use of DME;Pain;Decreased safety awareness       PT Treatment Interventions DME instruction;Therapeutic activities;Gait training;Therapeutic exercise;Patient/family education;Balance training;Functional mobility training    PT Goals (Current goals can be found in the Care Plan section)  Acute Rehab PT Goals Patient Stated Goal: agreeable to OOB to chair with encouragement PT Goal Formulation: With patient Time For Goal Achievement: 05/04/19 Potential to Achieve Goals: Good    Frequency Min 2X/week   Barriers to discharge        Co-evaluation PT/OT/SLP Co-Evaluation/Treatment: Yes Reason for Co-Treatment: For patient/therapist safety PT goals addressed during session: Mobility/safety with mobility OT goals addressed during session: ADL's and self-care       AM-PAC PT "6 Clicks" Mobility  Outcome Measure Help needed turning from your back to your side while in a flat bed without using bedrails?: A Lot Help needed moving from lying on your back to sitting on the side of a flat bed without using bedrails?: A Lot Help needed moving to and from a bed to a chair (including a wheelchair)?: A Lot Help needed standing up from a chair using your arms (e.g., wheelchair or bedside chair)?: A Lot Help needed to walk in hospital room?: A Lot Help needed climbing 3-5 steps with a railing? : Total 6 Click Score: 11    End of Session Equipment Utilized During Treatment: Gait belt;Other (comment)(maintained suction with NGT during mobility) Activity Tolerance: Patient limited by fatigue;Patient limited by pain Patient left: in chair;with call bell/phone within reach;with chair alarm  set Nurse Communication: Mobility status PT Visit Diagnosis: Muscle weakness (generalized) (M62.81);Difficulty in walking, not elsewhere classified (R26.2)    Time: 1034-1100 PT Time Calculation (min) (ACUTE ONLY): 26 min   Charges:   PT Evaluation $PT Eval Low Complexity: 1 Low         Andranik Jeune Conception Chancy, PT Acute Rehabilitation Services Pager [phone]  Office [phone]  Arnel Wymer D Jodie Cavey 04/20/2019, 11:30 AM

## 2019-04-20 NOTE — Evaluation (Signed)
Occupational Therapy Evaluation Patient Details Name: [name] MRN: [id number] DOB: Sep 28, 1934 Today's Date: 04/20/2019    History of Present Illness Pt is an 85 year old woman admitted 04/18/19 with vomiting x 2 days, + SBO. PMH: SBO, colostomy, rectal ca, CKD, gout, ICD, HTN, COPD, vertigo, dementia, CHF, gout.   Clinical Impression   Pt reports walking with a rollator. She is assisted for bathing and dressing, but can groom and self feed typically. Pt presents with generalized weakness and decreased activity tolerance. She demonstrates poor standing balance and requires up to total assist for ADL. Recommending continued rehab in SNF prior to return back to ALF    Follow Up Recommendations  SNF;Supervision/Assistance - 24 hour    Equipment Recommendations  Other (comment)(defer to next venue)    Recommendations for Other Services       Precautions / Restrictions Precautions Precautions: Fall Precaution Comments: NGT Restrictions Weight Bearing Restrictions: No      Mobility Bed Mobility Overal bed mobility: Needs Assistance Bed Mobility: Rolling;Sidelying to Sit Rolling: Mod assist Sidelying to sit: Mod assist;+2 for physical assistance       General bed mobility comments: used rail to self assist with rolling and use of pad, assist for LEs over EOB and to raise trunk  Transfers Overall transfer level: Needs assistance Equipment used: 2 person hand held assist Transfers: Sit to/from [facility] Sit to Stand: +2 physical assistance;Mod assist Stand pivot transfers: +2 physical assistance;Min assist       General transfer comment: assist to rise and steady, pt took small pivotal steps from bed to chair    Balance Overall balance assessment: Needs assistance   Sitting balance-Leahy Scale: Fair     Standing balance support: Bilateral upper extremity supported Standing balance-Leahy Scale: Poor                             ADL  either performed or assessed with clinical judgement   ADL Overall ADL's : Needs assistance/impaired Eating/Feeding: NPO   Grooming: Wash/dry face;Oral care;Sitting;Minimal assistance   Upper Body Bathing: Moderate assistance;Sitting   Lower Body Bathing: Maximal assistance;Sit to/from stand   Upper Body Dressing : Minimal assistance;Sitting   Lower Body Dressing: Maximal assistance;Bed level   Toilet Transfer: +2 for physical assistance;Minimal assistance;Stand-pivot Toilet Transfer Details (indicate cue type and reason): simulated to chair         Functional mobility during ADLs: (pt declined ambulation)       Vision Patient Visual Report: No change from baseline       Perception     Praxis      Pertinent Vitals/Pain Pain Assessment: 0-10 Pain Score: 10-Worst pain ever Pain Location: throat Pain Descriptors / Indicators: Sore Pain Intervention(s): Monitored during session     Hand Dominance Right   Extremity/Trunk Assessment Upper Extremity Assessment Upper Extremity Assessment: Generalized weakness   Lower Extremity Assessment Lower Extremity Assessment: Defer to PT evaluation   Cervical / Trunk Assessment Cervical / Trunk Assessment: Other exceptions(forward head)   Communication Communication Communication: No difficulties(minimally conversant)   Cognition Arousal/Alertness: Awake/alert Behavior During Therapy: WFL for tasks assessed/performed Overall Cognitive Status: History of cognitive impairments - at baseline                                 General Comments: pt with hx of dementia, following commands with increased time  General Comments       Exercises     Shoulder Instructions      Home Living Family/patient expects to be discharged to:: Assisted living                             Home Equipment: Walker - 4 wheels;Shower seat;Grab bars - tub/shower;Grab bars - toilet          Prior  Functioning/Environment Level of Independence: Needs assistance  Gait / Transfers Assistance Needed: pt reports walking with rollator ADL's / Homemaking Assistance Needed: pt states she requires assist for dressing and bathing            OT Problem List: Decreased strength;Decreased activity tolerance;Impaired balance (sitting and/or standing);Decreased knowledge of use of DME or AE;Pain;Decreased cognition;Decreased safety awareness      OT Treatment/Interventions: Self-care/ADL training;DME and/or AE instruction;Patient/family education;Balance training;Therapeutic activities;Therapeutic exercise    OT Goals(Current goals can be found in the care plan section) Acute Rehab OT Goals Patient Stated Goal: agreeable to OOB to chair with encouragement OT Goal Formulation: With patient Time For Goal Achievement: 05/04/19 Potential to Achieve Goals: Good ADL Goals Pt Will Perform Grooming: with min guard assist;standing Pt Will Transfer to Toilet: with min assist;ambulating;bedside commode Pt Will Perform Toileting - Clothing Manipulation and hygiene: with min assist;sit to/from stand Pt/caregiver will Perform Home Exercise Program: Increased strength;Both right and left upper extremity;With minimal assist Additional ADL Goal #1: Pt will perform bed mobility with min assist in preparation for ADL.  OT Frequency: Min 2X/week   Barriers to D/C:            Co-evaluation PT/OT/SLP Co-Evaluation/Treatment: Yes Reason for Co-Treatment: For patient/therapist safety   OT goals addressed during session: ADL's and self-care      AM-PAC OT "6 Clicks" Daily Activity     Outcome Measure Help from another person eating meals?: Total Help from another person taking care of personal grooming?: A Little Help from another person toileting, which includes using toliet, bedpan, or urinal?: A Lot Help from another person bathing (including washing, rinsing, drying)?: A Lot Help from another person  to put on and taking off regular upper body clothing?: A Little Help from another person to put on and taking off regular lower body clothing?: Total 6 Click Score: 12   End of Session Nurse Communication: Mobility status;Other (comment)(Sp02 100% on RA)  Activity Tolerance: Patient tolerated treatment well Patient left: in chair;with call bell/phone within reach;with chair alarm set  OT Visit Diagnosis: Unsteadiness on feet (R26.81);Other abnormalities of gait and mobility (R26.89);Pain;Muscle weakness (generalized) (M62.81);Other symptoms and signs involving cognitive function                Time: 1031-1057 OT Time Calculation (min): 26 min Charges:  OT General Charges $OT Visit: 1 Visit OT Evaluation $OT Eval Moderate Complexity: 1 Mod  Nestor Lewandowsky, OTR/L Acute Rehabilitation Services Pager: [phone] Office: [phone]  Malka So 04/20/2019, 11:16 AM

## 2019-04-20 NOTE — Progress Notes (Signed)
Assessment & Plan: HD#3 - small bowel obstruction             NG tube advanced 5 cm - appears to be functioning normally             Small bowel obstruction protocol ongoing - AXR pending this AM  Will ask nursing to change ostomy bag today             Will follow with you - still hope to avoid operative intervention        Armandina Gemma, MD       [facility]       Office: [phone]   Chief Complaint: Small bowel obstruction  Subjective: Patient in bed sleeping, arouses easily.  No complaints.  Objective: Vital signs in last 24 hours: Temp:  [97.8 F (36.6 C)-98.8 F (37.1 C)] 97.8 F (36.6 C) (07/12 0618) Pulse Rate:  [87-111] 87 (07/12 0622) Resp:  [16-18] 18 (07/12 0618) BP: (155-182)/(65-83) 178/75 (07/12 0618) SpO2:  [94 %-99 %] 95 % (07/12 0622) Last BM Date: 04/17/19  Intake/Output from previous day: 07/11 0701 - 07/12 0700 In: 200 [I.V.:200] Out: 2200 [Urine:1650; Emesis/NG output:550] Intake/Output this shift: No intake/output data recorded.  Physical Exam: HEENT - sclerae clear, mucous membranes moist Neck - soft Chest - clear bilaterally Cor - RRR Abdomen - moderate distension, slightly softer; mild diffuse tenderness to palpation; no obvious hernia around stoma; no output in ostomy bag   Lab Results:  Recent Labs    04/18/19 1648 04/19/19 0505  WBC 9.1 10.7*  HGB 10.4* 11.7*  HCT 34.4* 38.6  PLT 253 247   BMET Recent Labs    04/18/19 1648 04/19/19 0505  NA 140 143  K 4.1 4.3  CL 97* 100  CO2 32 30  GLUCOSE 104* 91  BUN 28* 28*  CREATININE 1.46* 1.29*  CALCIUM 10.6* 10.6*   PT/INR No results for input(s): LABPROT, INR in the last 72 hours. Comprehensive Metabolic Panel:    Component Value Date/Time   NA 143 04/19/2019 0505   NA 140 04/18/2019 1648   K 4.3 04/19/2019 0505   K 4.1 04/18/2019 1648   CL 100 04/19/2019 0505   CL 97 (L) 04/18/2019 1648   CO2 30 04/19/2019 0505   CO2 32 04/18/2019 1648   BUN 28 (H) 04/19/2019 0505   BUN 28 (H) 04/18/2019 1648   CREATININE 1.29 (H) 04/19/2019 0505   CREATININE 1.46 (H) 04/18/2019 1648   CREATININE 0.83 04/14/2015 1514   GLUCOSE 91 04/19/2019 0505   GLUCOSE 104 (H) 04/18/2019 1648   CALCIUM 10.6 (H) 04/19/2019 0505   CALCIUM 10.6 (H) 04/18/2019 1648   AST 17 04/19/2019 0505   AST 15 04/18/2019 1648   ALT 15 04/19/2019 0505   ALT 18 04/18/2019 1648   ALKPHOS 71 04/19/2019 0505   ALKPHOS 70 04/18/2019 1648   BILITOT 0.6 04/19/2019 0505   BILITOT 0.4 04/18/2019 1648   PROT 7.7 04/19/2019 0505   PROT 7.6 04/18/2019 1648   ALBUMIN 3.8 04/19/2019 0505   ALBUMIN 4.0 04/18/2019 1648    Studies/Results: Ct Abdomen Pelvis Wo Contrast  Result Date: 04/18/2019 CLINICAL DATA:  Abdominal pain and distention, nausea vomiting for 2 days. No fevers, no chills. Extensive surgical history. EXAM: CT ABDOMEN AND PELVIS WITHOUT CONTRAST TECHNIQUE: Multidetector CT imaging of the abdomen and pelvis was performed following the standard protocol without IV contrast. COMPARISON:  CT abdomen pelvis 08/11/2018, 06/23/2018 FINDINGS: Lower chest:  AICD leads positioned in the right atrium and cardiac apex. There is cardiomegaly with biatrial enlargement as well as mitral annular calcification and coronary artery calcifications. There are bibasilar areas of subsegmental atelectasis and/or scarring. Hepatobiliary: No visible or contour deforming and [place] lesions. Dependently layering calcified gallstones are present within the otherwise normal gallbladder. No biliary ductal dilatation. No calcified intraductal gallstones. Pancreas: Unremarkable. No pancreatic ductal dilatation or surrounding inflammatory changes. Spleen: Normal in size without focal abnormality. Adrenals/Urinary Tract: Adrenal glands are unremarkable. There is new mild right hydroureteronephrosis without visualized calcification along the course of the right ureter. Vascular calcifications are noted in  both renal hila. No focal lesion or hydronephrosis. Bladder is unremarkable. Stomach/Bowel: Postsurgical changes from prior proctocolectomy with left lower quadrant end colostomy formation. Small hiatal hernia. Stomach is distended with ingested material. There is extensive air in fluid dilatation of the proximal small bowel. Portion of the distended small bowel enters into a peristomal hernia sac in the left lower quadrant however the exiting bowel also appears distended with a second tapering transition point identified in the right lower quadrant (series 3, image 64) where the distal small bowel beyond this point is largely decompressed. The cecum is displaced into the low midline pelvis. Fecal material is present throughout the remaining portions of the colon. Vascular/Lymphatic: Aortic atherosclerosis. No enlarged abdominal or pelvic lymph nodes. No portal venous gas or air within the splanchnic draining vessels. Reproductive: Post hysterectomy.  No concerning adnexal lesions. Other: Parastomal hernia detailed above. And anterior abdominal wall laxity likely related to multiple prior surgical procedures. No abdominopelvic ascites. Musculoskeletal: No acute or significant osseous findings. Stable appearance of the L1 superior endplate compression deformity when compared to most recent CT. IMPRESSION: 1. Postsurgical changes from proctocolectomy with end colostomy in the left lower quadrant. 2. Small-bowel obstruction with narrowing at the entry of the previously described parastomal hernia which appears only partially obstructive on today's exam with dilated loops beyond the hernia sac. A more distal transition point seen in the right lower quadrant, as detailed above. No evidence of pneumatosis. No free air or free fluid. 3. New mild right hydroureteronephrosis without visualized calcification along the course of the right ureter, which may reflect a recently passed stone. 4. Cholelithiasis without evidence of  acute cholecystitis. 5. Coronary and aortic atherosclerosis. 6. Cardiomegaly with biatrial enlargement. These results were called by telephone at the time of interpretation on 04/18/2019 at 7:53 pm to Dr. Theotis Burrow , who verbally acknowledged these results. Electronically Signed   By: MD Lovena Le   On: 04/18/2019 19:54   Dg Abd Portable 1v-small Bowel Obstruction Protocol-initial, 8 Hr Delay  Result Date: 04/19/2019 CLINICAL DATA:  Small bowel obstruction. EXAM: PORTABLE ABDOMEN - 1 VIEW COMPARISON:  04/18/2019 and CT 04/18/2019 FINDINGS: Two images are labeled as 8 hour images. There is a nasogastric tube in the proximal stomach region. There is contrast in the stomach. There continues to be dilated loops of small bowel throughout the abdomen particularly on the left side. Evidence for surgical mesh in the left lower abdomen. There may be a small amount of oral contrast in the left abdomen but difficult to evaluate due to large amount of stool. Patient has a known left colostomy and parastomal hernia. IMPRESSION: Persistent dilated loops of small bowel. Majority of the contrast is in the stomach. Findings remain compatible with a small bowel obstruction. Electronically Signed   By: Markus Daft M.D.   On: 04/19/2019 09:34   Dg Abd  Portable 1v-small Bowel Protocol-position Verification  Result Date: 04/18/2019 CLINICAL DATA:  Check nasogastric catheter placement EXAM: PORTABLE ABDOMEN - 1 VIEW COMPARISON:  None. FINDINGS: Scattered large and small bowel gas is noted. Some retained fecal material is seen. Gastric catheter is noted extending into the stomach. Proximal side port lies at the gastroesophageal junction. This could be advanced a few more cm deeper into the stomach. IMPRESSION: Gastric catheter as described. This could be advanced a few more cm deeper into the stomach. Electronically Signed   By: Inez Catalina M.D.   On: 04/18/2019 22:55      Armandina Gemma 04/20/2019  Patient ID: [name], female   DOB: 04/02/1934, 85 y.o.   MRN: [id number]

## 2019-04-21 ENCOUNTER — Inpatient Hospital Stay (HOSPITAL_COMMUNITY): Payer: Medicare HMO

## 2019-04-21 LAB — CBC
HCT: 36.2 % (ref 36.0–46.0)
Hemoglobin: 10.5 g/dL — ABNORMAL LOW (ref 12.0–15.0)
MCH: 28 pg (ref 26.0–34.0)
MCHC: 29 g/dL — ABNORMAL LOW (ref 30.0–36.0)
MCV: 96.5 fL (ref 80.0–100.0)
Platelets: 257 10*3/uL (ref 150–400)
RBC: 3.75 MIL/uL — ABNORMAL LOW (ref 3.87–5.11)
RDW: 15.3 % (ref 11.5–15.5)
WBC: 11.1 10*3/uL — ABNORMAL HIGH (ref 4.0–10.5)
nRBC: 0 % (ref 0.0–0.2)

## 2019-04-21 LAB — BASIC METABOLIC PANEL
Anion gap: 14 (ref 5–15)
BUN: 24 mg/dL — ABNORMAL HIGH (ref 8–23)
CO2: 24 mmol/L (ref 22–32)
Calcium: 9.7 mg/dL (ref 8.9–10.3)
Chloride: 107 mmol/L (ref 98–111)
Creatinine, Ser: 1.09 mg/dL — ABNORMAL HIGH (ref 0.44–1.00)
GFR calc Af Amer: 54 mL/min — ABNORMAL LOW (ref >60)
GFR calc non Af Amer: 47 mL/min — ABNORMAL LOW (ref >60)
Glucose, Bld: 77 mg/dL (ref 70–99)
Potassium: 3.7 mmol/L (ref 3.5–5.1)
Sodium: 145 mmol/L (ref 135–145)

## 2019-04-21 NOTE — Progress Notes (Addendum)
CC: Abdominal pain  Subjective:  Patient lives in assisted living but says she lives by her self.  She is in bed with a wick on.    Her NG is working properly,  draining very little but it is brown in color fluid.  She still somewhat distended and tender on exam.  She has no IV or oral fluids going currently.    Her last EF 2018 was 40 to 45% moderate AR, moderate mitral stenosis.  Objective: Vital signs in last 24 hours: Temp:  [97.8 F (36.6 C)-98.1 F (36.7 C)] 98.1 F (36.7 C) (07/13 0606) Pulse Rate:  [105-109] 105 (07/13 0606) Resp:  [18-20] 20 (07/13 0606) BP: (137-164)/(57-82) 137/82 (07/13 0606) SpO2:  [90 %-98 %] 90 % (07/13 0606) Last BM Date: 04/17/19 200 IV recorded 700 urine 375 NG Afebrile/tachycardic/blood pressure moderately elevated. Creatinine improving to 1.09, potassium 3.7, WBC 11.1 CT scan 04/18/2019: Showed postsurgical changes from proctocolectomy with end colostomy in the left lower quadrant, small bowel obstruction with there are point at the entry of the previous prescribed parastomal hernia which appears only partially obstructive with dilated loops beyond the hernia sac.  More distal transition point seen in the right lower quadrant.  No evidence of pneumatosis or free air.  Right hydro-ureter nephrosis without visualized calcification, possible recently passed stone. Single view abdominal film 7/12: Contrast administered via the NG tube now lies with the colon there are distended and gas-filled loops of the small bowel again noted. Single view 8-hour post small bowel protocol film 7/11 showed a majority of the contrast in the stomach findings compatible with a small bowel obstruction.  Intake/Output from previous day: 07/12 0701 - 07/13 0700 In: 200 [I.V.:200] Out: 1075 [Urine:700; Emesis/NG output:375] Intake/Output this shift: No intake/output data recorded.  General appearance: alert, cooperative and she is somewhat confused but speaking  clearly. Resp: clear to auscultation bilaterally GI: distended and tender to palpation.  some fluid in the ostomy that appears green.  Lab Results:  Recent Labs    04/19/19 0505 04/21/19 0341  WBC 10.7* 11.1*  HGB 11.7* 10.5*  HCT 38.6 36.2  PLT 247 257    BMET Recent Labs    04/19/19 0505 04/21/19 0341  NA 143 145  K 4.3 3.7  CL 100 107  CO2 30 24  GLUCOSE 91 77  BUN 28* 24*  CREATININE 1.29* 1.09*  CALCIUM 10.6* 9.7   PT/INR No results for input(s): LABPROT, INR in the last 72 hours.  Recent Labs  Lab 04/18/19 1648 04/19/19 0505  AST 15 17  ALT 18 15  ALKPHOS 70 71  BILITOT 0.4 0.6  PROT 7.6 7.7  ALBUMIN 4.0 3.8     Lipase     Component Value Date/Time   LIPASE 21 04/18/2019 1648     Medications: . hydrALAZINE  10 mg Intravenous Q8H  . mometasone-formoterol  2 puff Inhalation BID    Prior to Admission medications   Medication Sig Start Date End Date Taking? Authorizing Provider  acetaminophen (TYLENOL) 500 MG tablet Take 1,000 mg by mouth 3 (three) times daily.    Yes [provider]  allopurinol (ZYLOPRIM) 100 MG tablet Take 100 mg by mouth daily.   Yes [provider]  aspirin EC 81 MG tablet Take 1 tablet (81 mg total) by mouth every morning. 03/03/15  Yes Rai, Ripudeep K, MD  atorvastatin (LIPITOR) 20 MG tablet Take 20 mg by mouth at bedtime.    Yes [provider]  Calcium Carbonate-Vitamin D 600-400 MG-UNIT tablet Take 1 tablet by mouth 2 (two) times a day.   Yes [provider]  carboxymethylcellulose (REFRESH) 1 % ophthalmic solution Place 1 drop into both eyes 3 (three) times daily.    Yes [provider]  carvedilol (COREG) 6.25 MG tablet Take 6.25 mg by mouth 2 (two) times daily with a meal.    Yes [provider]  cyclobenzaprine (FLEXERIL) 5 MG tablet Take 5 mg by mouth at bedtime.   Yes [provider]  famotidine (PEPCID) 40 MG tablet Take 40 mg by mouth daily.   Yes  [provider]  furosemide (LASIX) 20 MG tablet Take 20 mg by mouth daily as needed (weight gain of more >3 lbs in 24 hours).    Yes [provider]  furosemide (LASIX) 40 MG tablet Take 1 tablet (40 mg total) by mouth daily. 12/30/15  Yes Shirley Friar, PA-C  hydrALAZINE (APRESOLINE) 25 MG tablet Take 0.5 tablets (12.5 mg total) by mouth 3 (three) times daily. 12/23/15  Yes Clegg, Amy D, NP  iron polysaccharides (NIFEREX) 150 MG capsule Take 150 mg by mouth 2 (two) times daily.   Yes [provider]  isosorbide mononitrate (IMDUR) 30 MG 24 hr tablet Take 1 tablet (30 mg total) by mouth daily. 03/24/15  Yes Dhungel, Nishant, MD  Lidocaine (HM LIDOCAINE PATCH) 4 % PTCH Apply 1 patch topically daily. Take off after 12 hours   Yes [provider]  loperamide (IMODIUM A-D) 2 MG tablet Take 2 mg by mouth 4 (four) times daily as needed for diarrhea or loose stools.   Yes [provider]  loratadine (CLARITIN) 10 MG tablet Take 10 mg by mouth daily.   Yes [provider]  magnesium hydroxide (MILK OF MAGNESIA) 400 MG/5ML suspension Take 30 mLs by mouth at bedtime as needed for mild constipation.   Yes [provider]  meclizine (ANTIVERT) 25 MG tablet Take 1 tablet (25 mg total) by mouth 3 (three) times daily as needed for dizziness. 6/57/84  Yes Delora Fuel, MD  montelukast (SINGULAIR) 10 MG tablet Take 10 mg by mouth daily.    Yes [provider]  ondansetron (ZOFRAN) 4 MG tablet Take 1 tablet (4 mg total) by mouth every 6 (six) hours as needed for nausea or vomiting. 6/96/29  Yes Delora Fuel, MD  polyethylene glycol [facility] / Floria Raveling) packet Take 17 g by mouth every other day. Mix with 8 ounces of fluid and drink   Yes [provider]  sodium chloride (OCEAN) 0.65 % SOLN nasal spray Place 1 spray into both nostrils as needed for congestion.   Yes [provider]  traMADol (ULTRAM) 50 MG tablet Take 50 mg  by mouth 3 (three) times daily.   Yes [provider]  albuterol (PROVENTIL) (2.5 MG/3ML) 0.083% nebulizer solution Take 3 mLs (2.5 mg total) by nebulization every 4 (four) hours as needed for wheezing. Patient not taking: Reported on 04/18/2019 04/28/15   Geradine Girt, DO  Fluticasone-Salmeterol (ADVAIR) 500-50 MCG/DOSE AEPB Inhale 1 puff into the lungs 2 (two) times daily. Rinse mouth after use    [provider]  levothyroxine (SYNTHROID, LEVOTHROID) 50 MCG tablet Take 50 mcg by mouth daily.  01/16/15   [provider]  potassium chloride SA (K-DUR,KLOR-CON) 20 MEQ tablet Take 20 mEq by mouth daily.     [provider]    Anti-infectives (From admission, onward)   None  Assessment/Plan Chronic kidney disease Hx combined systolic/diastolic CHF with ICD Dementia Anemia Hypertension Hypothyroid Gout COVID negative  Small bowel obstruction  History of rectal carcinoma with abdominal perineal resection 2006; parastomal hernia repair 2007; cholecystectomy, abdominal hysterectomy   - SBP contrast in colon after 50 plus hours/still distended and tender   FEN: No IV fluids listed/n.p.o. ID: None DVT: SCDs only - she can have Lovenox/heparin for DVT prophylaxis from our standpoint Follow-up: To be determined POC: [name],[name] Son [phone]    Plan: Fluids per medicine Dr. Wynelle Cleveland.  DC wick; get her out of bed and mobilize. I will ask ostomy nursing to replace current bag and put one on that we can see drainage better.    I am hesitant to start her on clears with the ongoing distension and tenderness.    PT consult, bedside commode.    LOS: 3 days    JENNINGS,WILLARD 04/21/2019 [phone]  Agree with above. Needs to ambulate. By KUB she is better, hard to tell clinically.  I spoke to her son, [name], who is her POA.  She has two other sons, Alvester Chou Minor and Daryl Minor.  Alphonsa Overall, MD, [facility]  Surgery Pager: [phone] Office phone:  [phone]

## 2019-04-21 NOTE — Progress Notes (Signed)
Ostomy bag changed to a clear bag. Old bag had small amount of black stool. Donne Hazel, RN

## 2019-04-21 NOTE — TOC Initial Note (Signed)
Transition of Care [facility]) - Initial/Assessment Note    Patient Details  Name: [name] MRN: [id number] Date of Birth: 07/18/1934  Transition of Care [facility]) CM/SW Contact:    Servando Snare, LCSW Phone Number: 04/21/2019, 1:25 PM  Clinical Narrative:  Patient is from [place] and reports that she uses a rolling walker at baseline. Patient has a colostomy and needs assistance with dressing.                  Patient is agreeable to SNF for rehab and reports that she has been to rehab before. LCSW faxed patient out to SNFs.    Expected Discharge Plan: Skilled Nursing Facility Barriers to Discharge: Continued Medical Work up   Patient Goals and CMS Choice Patient states their goals for this hospitalization and ongoing recovery are:: Get better CMS Medicare.gov Compare Post Acute Care list provided to:: Patient Choice offered to / list presented to : Patient  Expected Discharge Plan and Services Expected Discharge Plan: [place] In-house Referral: NA Discharge Planning Services: NA Post Acute Care Choice: [place] Living arrangements for the past 2 months: [place]                 DME Arranged: N/A DME Agency: NA       HH Arranged: NA [place] Agency: NA        Prior Living Arrangements/Services Living arrangements for the past 2 months: [place] Lives with:: Facility Resident Patient language and need for interpreter reviewed:: Yes Do you feel safe going back to the place where you live?: Yes      Need for Family Participation in Patient Care: Yes (Comment) Care giver support system in place?: Yes (comment) Current home services: DME Criminal Activity/Legal Involvement Pertinent to Current Situation/Hospitalization: No - Comment as needed  Activities of Daily Living Home Assistive Devices/Equipment: Dentures (specify type), Walker (specify type) ADL Screening (condition at time of  admission) Patient's cognitive ability adequate to safely complete daily activities?: Yes Is the patient deaf or have difficulty hearing?: No Does the patient have difficulty seeing, even when wearing glasses/contacts?: No Does the patient have difficulty concentrating, remembering, or making decisions?: Yes Patient able to express need for assistance with ADLs?: Yes Does the patient have difficulty dressing or bathing?: Yes Independently performs ADLs?: No Communication: Needs assistance Dressing (OT): Needs assistance Grooming: Needs assistance Feeding: Needs assistance Bathing: Needs assistance Toileting: Needs assistance In/Out Bed: Needs assistance Walks in Home: Needs assistance(with device "sometimes") Does the patient have difficulty walking or climbing stairs?: Yes Weakness of Legs: Both Weakness of Arms/Hands: Both  Permission Sought/Granted Permission sought to share information with : Customer service manager, Family Supports Permission granted to share information with : Yes, Verbal Permission Granted  Share Information with NAME: [name]  Permission granted to share info w AGENCY: [employer], SNFs  Permission granted to share info w Relationship: son     Emotional Assessment Appearance:: Appears stated age Attitude/Demeanor/Rapport: Engaged Affect (typically observed): Calm Orientation: : Oriented to Self, Oriented to Place, Oriented to  Time, Oriented to Situation Alcohol / Substance Use: Not Applicable Psych Involvement: No (comment)  Admission diagnosis:  Small bowel obstruction ([place]) [K56.609] Encounter for imaging study to confirm nasogastric (NG) tube placement [Z01.89] Small bowel obstruction due to adhesions [facility]) [K56.50] Patient Active Problem List   Diagnosis Date Noted  . Chronic combined systolic and diastolic CHF (congestive heart failure) ([place]) 04/18/2019  . Personal history of gout  08/28/2018  . Recurrent parastomal hernia at LLQ  colostomy 08/21/2018  . SBO (small bowel obstruction) ([place]) 08/21/2018  . Chest pain 10/28/2017  . Aspiration of liquid 10/28/2017  . Bilateral lower extremity edema   . Pneumonia 04/22/2017  . Altered mental status   . Hypotension 02/16/2017  . Syncope 02/02/2016  . Faintness   . Essential hypertension 11/16/2015  . Hypothyroidism 11/16/2015  . Acute on chronic systolic CHF (congestive heart failure) ([place]) 11/15/2015  . Hypertensive heart disease 10/04/2015  . Elevated troponin 10/04/2015  . Acute respiratory failure ([place])   . Acute systolic congestive heart failure ([place])   . NICM (nonischemic cardiomyopathy) ([place]) 08/11/2015  . Normal coronary arteries 08/11/2015  . Dyslipidemia 07/27/2015  . CKD (chronic kidney disease) stage 3, GFR 30-59 ml/min (HCC) 07/27/2015  . Anemia 07/27/2015  . Benign essential HTN 07/27/2015  . FUO (fever of unknown origin) 07/27/2015  . IDA (iron deficiency anemia) 07/27/2015  . Chronic systolic CHF (congestive heart failure) ([place]) 07/27/2015  . Rectal cancer ypTypN0 (0/13) s/p chemoXRT/ APR/colostomy 2006 07/27/2015  . Sepsis ([place]) 07/24/2015  . Acute encephalopathy 07/24/2015  . Hypokalemia 07/24/2015  . Dementia ([place]) 07/23/2015  . COPD (chronic obstructive pulmonary disease) ([place]) 04/25/2015  . ICD in place 04/14/2015  . Colostomy in place [facility]) 03/01/2015  . Pacemaker 11/07/2012  . Anxiety 10/04/2011   PCP:  [name], PA Pharmacy:  No Pharmacies Listed    Social Determinants of Health (SDOH) Interventions    Readmission Risk Interventions Readmission Risk Prevention Plan 04/21/2019  Transportation Screening Complete  HRI or [place] Complete  Social Work Consult for [place] Planning/Counseling Complete  Some recent data might be hidden

## 2019-04-21 NOTE — Consult Note (Signed)
Discussed with bedside nurse, she will chang ostomy pouch to clear either 1pc of 2pc based on patient's current pouching system.   [place], [place], [place]

## 2019-04-21 NOTE — Care Management Important Message (Signed)
Important Message  Patient Details IM Letter given to Servando Snare SW to present to the Patient Name: [name] MRN: [id number] Date of Birth: 05-12-1934   Medicare Important Message Given:  Yes     Kerin Salen 04/21/2019, 10:50 AM

## 2019-04-21 NOTE — NC FL2 (Signed)
[place] MEDICAID FL2 LEVEL OF CARE SCREENING TOOL     IDENTIFICATION  Patient Name: [name] Birthdate: 25-Apr-1934 Sex: female Admission Date (Current Location): 04/18/2019  [facility] and [place] Number:  Herbalist and Address:  [facility],  [place] [address] [place]      Provider Number: [id number]  Attending Physician Name and Address:  Debbe Odea, MD  Relative Name and Phone Number:       Current Level of Care: Hospital Recommended Level of Care: [place] Prior Approval Number:    Date Approved/Denied:   PASRR Number: pending  Discharge Plan: SNF    Current Diagnoses: Patient Active Problem List   Diagnosis Date Noted  . Chronic combined systolic and diastolic CHF (congestive heart failure) ([place]) 04/18/2019  . Personal history of gout 08/28/2018  . Recurrent parastomal hernia at LLQ colostomy 08/21/2018  . SBO (small bowel obstruction) ([place]) 08/21/2018  . Chest pain 10/28/2017  . Aspiration of liquid 10/28/2017  . Bilateral lower extremity edema   . Pneumonia 04/22/2017  . Altered mental status   . Hypotension 02/16/2017  . Syncope 02/02/2016  . Faintness   . Essential hypertension 11/16/2015  . Hypothyroidism 11/16/2015  . Acute on chronic systolic CHF (congestive heart failure) ([place]) 11/15/2015  . Hypertensive heart disease 10/04/2015  . Elevated troponin 10/04/2015  . Acute respiratory failure ([place])   . Acute systolic congestive heart failure ([place])   . NICM (nonischemic cardiomyopathy) ([place]) 08/11/2015  . Normal coronary arteries 08/11/2015  . Dyslipidemia 07/27/2015  . CKD (chronic kidney disease) stage 3, GFR 30-59 ml/min (HCC) 07/27/2015  . Anemia 07/27/2015  . Benign essential HTN 07/27/2015  . FUO (fever of unknown origin) 07/27/2015  . IDA (iron deficiency anemia) 07/27/2015  . Chronic systolic CHF (congestive heart failure) ([place]) 07/27/2015  . Rectal cancer ypTypN0 (0/13) s/p chemoXRT/  APR/colostomy 2006 07/27/2015  . Sepsis ([place]) 07/24/2015  . Acute encephalopathy 07/24/2015  . Hypokalemia 07/24/2015  . Dementia ([place]) 07/23/2015  . COPD (chronic obstructive pulmonary disease) ([place]) 04/25/2015  . ICD in place 04/14/2015  . Colostomy in place [facility]) 03/01/2015  . Pacemaker 11/07/2012  . Anxiety 10/04/2011    Orientation RESPIRATION BLADDER Height & Weight     Self, Time, Situation, Place  Normal Incontinent Weight: 216 lb (98 kg) Height:  5\' 6"  (167.6 cm)  BEHAVIORAL SYMPTOMS/MOOD NEUROLOGICAL BOWEL NUTRITION STATUS      Colostomy Diet(See dc summary)  AMBULATORY STATUS COMMUNICATION OF NEEDS Skin   Extensive Assist Verbally Normal                       Personal Care Assistance Level of Assistance  Bathing, Feeding, Dressing Bathing Assistance: Limited assistance Feeding assistance: Independent Dressing Assistance: Limited assistance     Functional Limitations Info  Sight, Hearing, Speech Sight Info: Adequate Hearing Info: Impaired Speech Info: Adequate    SPECIAL CARE FACTORS FREQUENCY  PT (By licensed PT), OT (By licensed OT)     PT Frequency: 5x/week OT Frequency: 5x/week            Contractures Contractures Info: Not present    Additional Factors Info  Code Status, Allergies Code Status Info: Full Allergies Info: NKA           Current Medications (04/21/2019):  This is the current hospital active medication list Current Facility-Administered Medications  Medication Dose Route Frequency Provider Last Rate Last Dose  . acetaminophen (TYLENOL) tablet 650 mg  650 mg Oral Q6H PRN Toy Baker, MD       Or  . acetaminophen (TYLENOL) suppository 650 mg  650 mg Rectal Q6H PRN Doutova, Anastassia, MD      . hydrALAZINE (APRESOLINE) injection 10 mg  10 mg Intravenous Q4H PRN Doutova, Anastassia, MD   10 mg at 04/18/19 2340  . hydrALAZINE (APRESOLINE) injection 10 mg  10 mg Intravenous Q8H Debbe Odea, MD   10 mg at 04/21/19 0603   . metoprolol tartrate (LOPRESSOR) injection 5 mg  5 mg Intravenous Q4H PRN Debbe Odea, MD      . mometasone-formoterol (DULERA) 200-5 MCG/ACT inhaler 2 puff  2 puff Inhalation BID Toy Baker, MD   2 puff at 04/21/19 0856  . morphine 2 MG/ML injection 1 mg  1 mg Intravenous Q4H PRN Toy Baker, MD   1 mg at 04/20/19 1011  . ondansetron (ZOFRAN) tablet 4 mg  4 mg Oral Q6H PRN Doutova, Anastassia, MD       Or  . ondansetron (ZOFRAN) injection 4 mg  4 mg Intravenous Q6H PRN Toy Baker, MD         Discharge Medications: Please see discharge summary for a list of discharge medications.  Relevant Imaging Results:  Relevant Lab Results:   Additional Information ssn: [phone]  Servando Snare, LCSW

## 2019-04-22 ENCOUNTER — Inpatient Hospital Stay (HOSPITAL_COMMUNITY): Payer: Medicare HMO

## 2019-04-22 LAB — BASIC METABOLIC PANEL
Anion gap: 14 (ref 5–15)
BUN: 22 mg/dL (ref 8–23)
CO2: 26 mmol/L (ref 22–32)
Calcium: 9.4 mg/dL (ref 8.9–10.3)
Chloride: 108 mmol/L (ref 98–111)
Creatinine, Ser: 1.04 mg/dL — ABNORMAL HIGH (ref 0.44–1.00)
GFR calc Af Amer: 58 mL/min — ABNORMAL LOW (ref >60)
GFR calc non Af Amer: 50 mL/min — ABNORMAL LOW (ref >60)
Glucose, Bld: 83 mg/dL (ref 70–99)
Potassium: 3.1 mmol/L — ABNORMAL LOW (ref 3.5–5.1)
Sodium: 148 mmol/L — ABNORMAL HIGH (ref 135–145)

## 2019-04-22 LAB — MAGNESIUM: Magnesium: 2.1 mg/dL (ref 1.7–2.4)

## 2019-04-22 LAB — CBC
HCT: 34.8 % — ABNORMAL LOW (ref 36.0–46.0)
Hemoglobin: 10.3 g/dL — ABNORMAL LOW (ref 12.0–15.0)
MCH: 29 pg (ref 26.0–34.0)
MCHC: 29.6 g/dL — ABNORMAL LOW (ref 30.0–36.0)
MCV: 98 fL (ref 80.0–100.0)
Platelets: 243 10*3/uL (ref 150–400)
RBC: 3.55 MIL/uL — ABNORMAL LOW (ref 3.87–5.11)
RDW: 15.3 % (ref 11.5–15.5)
WBC: 13.2 10*3/uL — ABNORMAL HIGH (ref 4.0–10.5)
nRBC: 0 % (ref 0.0–0.2)

## 2019-04-22 MED ORDER — POTASSIUM CHLORIDE CRYS ER 20 MEQ PO TBCR
20.0000 meq | EXTENDED_RELEASE_TABLET | Freq: Two times a day (BID) | ORAL | Status: DC
  Administered 2019-04-22 – 2019-04-26 (×8): 20 meq via ORAL
  Filled 2019-04-22 (×9): qty 1

## 2019-04-22 MED ORDER — KCL IN DEXTROSE-NACL 40-5-0.9 MEQ/L-%-% IV SOLN
INTRAVENOUS | Status: DC
  Administered 2019-04-22: 11:00:00 via INTRAVENOUS
  Filled 2019-04-22: qty 1000

## 2019-04-22 NOTE — Progress Notes (Addendum)
CC: Abdominal pain  Subjective: Patient still putting out a fair amount of fluid thru the NG.  NG canister is full and there is a fair amount of fluid coming through the NG tube at this time.    Her abdomen is softer, her ostomy bag is full of solid stool and gas.  Nurses replacing it now because it is about to blow off.  She did get up and walk some in the room with nursing assistance yesterday.  Objective: Vital signs in last 24 hours: Temp:  [97.6 F (36.4 C)-99 F (37.2 C)] 98.4 F (36.9 C) (07/14 0424) Pulse Rate:  [59-111] 102 (07/14 0427) Resp:  [16-18] 18 (07/14 0424) BP: (160-185)/(43-65) 163/65 (07/14 0424) SpO2:  [73 %-100 %] 100 % (07/14 0427) Last BM Date: 04/17/19 210 p.o. No IV fluids listed 550 urine Stool x1 Afebrile blood pressures up slightly. Potassium is 3.1 Creatinine improved 1.04 WBC of 13.2 [place] film shows the NG is over the EG junction and they recommend advancement.  Slight improvement with bowel obstruction progression of the enteric contrast is now seen in the distal transverse colon.  Small bowel still measuring 3.5 cm in the left mid abdomen. Intake/Output from previous day: 07/13 0701 - 07/14 0700 In: 210 [P.O.:210] Out: 1250 [Urine:700; Emesis/NG output:550] Intake/Output this shift: No intake/output data recorded.  General appearance: alert, cooperative and She seems fairly comfortable until I tried advancing the NG tube that made her a bit anxious. Resp: clear to auscultation bilaterally GI: Soft, little less distended.  She has a large amount of solid stool and gas in her ostomy bag.  Lab Results:  Recent Labs    04/21/19 0341 04/22/19 0826  WBC 11.1* 13.2*  HGB 10.5* 10.3*  HCT 36.2 34.8*  PLT 257 243    BMET Recent Labs    04/21/19 0341 04/22/19 0826  NA 145 148*  K 3.7 3.1*  CL 107 108  CO2 24 26  GLUCOSE 77 83  BUN 24* 22  CREATININE 1.09* 1.04*  CALCIUM 9.7 9.4   PT/INR No results for input(s): LABPROT,  INR in the last 72 hours.  Recent Labs  Lab 04/18/19 1648 04/19/19 0505  AST 15 17  ALT 18 15  ALKPHOS 70 71  BILITOT 0.4 0.6  PROT 7.6 7.7  ALBUMIN 4.0 3.8     Lipase     Component Value Date/Time   LIPASE 21 04/18/2019 1648     Medications: . hydrALAZINE  10 mg Intravenous Q8H  . mometasone-formoterol  2 puff Inhalation BID    Assessment/Plan Chronic kidney disease Hx combined systolic/diastolic CHF with ICD Dementia Anemia Hypertension Hypothyroid Gout COVID negative Hypokalemia   Small bowel obstruction             History of rectal carcinoma with abdominal perineal resection 2006; parastomal hernia repair 2007; cholecystectomy, abdominal hysterectomy             - SBP contrast in colon after 50 plus hours/still distended and tender   FEN: No IV fluids listed/n.p.o. ID: None DVT: SCDs only - she can have Lovenox/heparin for DVT prophylaxis from our standpoint Follow-up: To be determined POC: [name],[name] Son [phone]   Plan: We will start a clamping trial with her NG tube, some sips and chips.  I did try to advance the NG tube that is so small it may not have gone any place.  If she does well with the NG tube clamped we will probably remove it  either later today or tomorrow.   We will advance her diet.  Still do not see any IV fluid orders.  I will check magnesium, and add some fluids with K+/PO k+ while NG clamped.    Recheck in AM. I am concerned with her WBC going up.  Magnesium 2.1     LOS: 4 days    JENNINGS,WILLARD 04/22/2019 [phone]  Agree with above.  Her colostomy bag is full of stool.  She cannot give much of a history. I spoke to her son [name].   Alphonsa Overall, MD, [facility] Pager: [phone] Office phone:  [phone]

## 2019-04-22 NOTE — Progress Notes (Signed)
Physical Therapy Treatment Patient Details Name: [name] MRN: [id number] DOB: Mar 29, 1934 Today's Date: 04/22/2019    History of Present Illness Pt is an 85 year old woman admitted 04/18/19 with vomiting x 2 days, + SBO. PMH: SBO, colostomy, rectal ca, CKD, gout, ICD, HTN, COPD, vertigo, dementia, CHF, gout.    PT Comments    Pt up in recliner on arrival.  Colostomy full of air so requested RN empty/release prior to mobilizing.  Pt requiring mod assist to stand due to weakness and posterior bias however min assist for ambulating.  Continue to recommend SNF upon d/c.   Follow Up Recommendations  SNF;Supervision/Assistance - 24 hour     Equipment Recommendations  None recommended by PT    Recommendations for Other Services       Precautions / Restrictions Precautions Precautions: Fall Precaution Comments: NGT, colostomy    Mobility  Bed Mobility               General bed mobility comments: pt up in recliner on arrival  Transfers Overall transfer level: Needs assistance Equipment used: Rolling walker (2 wheeled) Transfers: Sit to/from Stand Sit to Stand: Mod assist         General transfer comment: verbal cues for hand placement and technique; assist to rise and steady, posterior bias upon standing requiring assist and cues to correct  Ambulation/Gait Ambulation/Gait assistance: Min assist Gait Distance (Feet): 28 Feet Assistive device: Rolling walker (2 wheeled) Gait Pattern/deviations: Step-through pattern;Decreased stride length;Trunk flexed     General Gait Details: verbal cues for RW positioning, assist for negotiating RW with turns, limited distance due to fatigue   Stairs             Wheelchair Mobility    Modified Rankin (Stroke Patients Only)       Balance                                            Cognition Arousal/Alertness: Awake/alert Behavior During Therapy: WFL for tasks assessed/performed Overall  Cognitive Status: History of cognitive impairments - at baseline                                 General Comments: pt with hx of dementia, following commands with increased time      Exercises      General Comments        Pertinent Vitals/Pain Pain Assessment: No/denies pain Pain Intervention(s): Monitored during session;Repositioned    Home Living                      Prior Function            PT Goals (current goals can now be found in the care plan section) Progress towards PT goals: Progressing toward goals    Frequency    Min 2X/week      PT Plan Current plan remains appropriate    Co-evaluation              AM-PAC PT "6 Clicks" Mobility   Outcome Measure  Help needed turning from your back to your side while in a flat bed without using bedrails?: A Lot Help needed moving from lying on your back to sitting on the side of a flat bed without using bedrails?: A Lot Help  needed moving to and from a bed to a chair (including a wheelchair)?: A Lot Help needed standing up from a chair using your arms (e.g., wheelchair or bedside chair)?: A Lot Help needed to walk in hospital room?: A Little Help needed climbing 3-5 steps with a railing? : A Lot 6 Click Score: 13    End of Session Equipment Utilized During Treatment: Gait belt Activity Tolerance: Patient limited by fatigue Patient left: in chair;with call bell/phone within reach;with chair alarm set Nurse Communication: Mobility status PT Visit Diagnosis: Muscle weakness (generalized) (M62.81);Difficulty in walking, not elsewhere classified (R26.2)     Time: 6063-0160 PT Time Calculation (min) (ACUTE ONLY): 18 min  Charges:  $Gait Training: 8-22 mins                    Carmelia Bake, PT, DPT Acute Rehabilitation Services Office: [phone] Pager: [phone]  Trena Platt 04/22/2019, 3:07 PM

## 2019-04-22 NOTE — Progress Notes (Addendum)
PROGRESS NOTE    [name]   RDE:[id number]  DOB: 07-23-1934  DOA: 04/18/2019 PCP: [name], PA   Brief Narrative:  [name] is a 85 y.o. 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    Subjective: She has not complaints. She has noted stool in her colostomy bag.     Assessment & Plan:   Principal Problem:   SBO (small bowel obstruction)  - appreciate management per general surgery - on slow IVF - general surgery is clamping NG tube- she has stool in her colostomy bag and bowel sounds today  Active Problems: Hypokalemia - replace- recheck tomorrow    Colostomy in place - h/o rectal cancer     CKD (chronic kidney disease) stage 3, GFR 30-59 ml/min - Cr is slightly better than when first admitted    Anemia - chronic- stable    Benign essential HTN - cont IV Hydralazine TID, cont PRN Hydralazine - BP still quite high-  started PRN Lopressor - BP better today     Chronic combined systolic and diastolic CHF (congestive heart failure) (HCC) - holding Lasix for now- follow closely for fluid overload    ICD in place    Dementia - has mild confusion   Time spent in minutes: 35  DVT prophylaxis: SCDS Code Status: Full code Family Communication:  Disposition Plan: f/u on SBO Consultants:   General surgery Procedures:   none Antimicrobials:  Anti-infectives (From admission, onward)   None       Objective: Vitals:   04/22/19 0017 04/22/19 0125 04/22/19 0424 04/22/19 0427  BP: (!) 165/58 (!) 160/60 (!) 163/65   Pulse: (!) 111 (!) 106 (!) 102 (!) 102  Resp:  16 18   Temp:   98.4 F (36.9 C)   TempSrc:   Oral   SpO2:  100% (!) 73% 100%  Weight:      Height:        Intake/Output Summary  (Last 24 hours) at 04/22/2019 1138 Last data filed at 04/22/2019 0910 Gross per 24 hour  Intake 240 ml  Output 875 ml  Net -635 ml   Filed Weights   04/18/19 1509  Weight: 98 kg    Examination: General exam: Appears comfortable  HEENT: PERRLA, oral mucosa moist, no sclera icterus or thrush Respiratory system: Clear to auscultation. Respiratory effort normal. Cardiovascular system: S1 & S2 heard,  No murmurs  Gastrointestinal system: Abdomen soft, non-tender, nondistended. Normal bowel sounds   Central nervous system: Alert and oriented. No focal neurological deficits. Extremities: No cyanosis, clubbing or edema Skin: No rashes or ulcers Psychiatry:  Mood & affect appropriate.     Data Reviewed: I have personally reviewed following labs and imaging studies  CBC: Recent Labs  Lab 04/18/19 1648 04/19/19 0505 04/21/19 0341 04/22/19 0826  WBC 9.1 10.7* 11.1* 13.2*  HGB 10.4* 11.7* 10.5* 10.3*  HCT 34.4* 38.6 36.2 34.8*  MCV 95.3 93.5 96.5 98.0  PLT 253 247 257 631   Basic Metabolic Panel: Recent Labs  Lab 04/18/19 1648 04/19/19 0505 04/21/19 0341 04/22/19 0826  NA 140 143 145 148*  K 4.1 4.3 3.7 3.1*  CL 97* 100 107 108  CO2 32 30 24 26   GLUCOSE 104* 91 77 83  BUN 28* 28* 24* 22  CREATININE 1.46*  1.29* 1.09* 1.04*  CALCIUM 10.6* 10.6* 9.7 9.4  MG  --  2.0  --  2.1  PHOS  --  4.3  --   --    GFR: Estimated Creatinine Clearance: 48.4 mL/min (A) (by C-G formula based on SCr of 1.04 mg/dL (H)). Liver Function Tests: Recent Labs  Lab 04/18/19 1648 04/19/19 0505  AST 15 17  ALT 18 15  ALKPHOS 70 71  BILITOT 0.4 0.6  PROT 7.6 7.7  ALBUMIN 4.0 3.8   Recent Labs  Lab 04/18/19 1648  LIPASE 21   No results for input(s): AMMONIA in the last 168 hours. Coagulation Profile: No results for input(s): INR, PROTIME in the last 168 hours. Cardiac Enzymes: No results for input(s): CKTOTAL, CKMB, CKMBINDEX, TROPONINI in the last 168 hours. BNP (last 3 results)  No results for input(s): PROBNP in the last 8760 hours. HbA1C: No results for input(s): HGBA1C in the last 72 hours. CBG: No results for input(s): GLUCAP in the last 168 hours. Lipid Profile: No results for input(s): CHOL, HDL, LDLCALC, TRIG, CHOLHDL, LDLDIRECT in the last 72 hours. Thyroid Function Tests: No results for input(s): TSH, T4TOTAL, FREET4, T3FREE, THYROIDAB in the last 72 hours. Anemia Panel: No results for input(s): VITAMINB12, FOLATE, FERRITIN, TIBC, IRON, RETICCTPCT in the last 72 hours. Urine analysis:    Component Value Date/Time   COLORURINE YELLOW 04/18/2019 1648   APPEARANCEUR HAZY (A) 04/18/2019 1648   LABSPEC 1.012 04/18/2019 1648   PHURINE 5.0 04/18/2019 1648   GLUCOSEU NEGATIVE 04/18/2019 1648   HGBUR NEGATIVE 04/18/2019 1648   BILIRUBINUR NEGATIVE 04/18/2019 1648   KETONESUR NEGATIVE 04/18/2019 1648   PROTEINUR NEGATIVE 04/18/2019 1648   UROBILINOGEN 0.2 07/23/2015 1610   NITRITE NEGATIVE 04/18/2019 1648   LEUKOCYTESUR TRACE (A) 04/18/2019 1648   Sepsis Labs: @LABRCNTIP (procalcitonin:4,lacticidven:4) ) Recent Results (from the past 240 hour(s))  SARS Coronavirus 2 (CEPHEID - Performed in [place] hospital lab), Hosp Order     Status: None   Collection Time: 04/18/19  4:48 PM   Specimen: Urine, Clean Catch; Nasopharyngeal  Result Value Ref Range Status   SARS Coronavirus 2 NEGATIVE NEGATIVE Final    Comment: (NOTE) If result is NEGATIVE SARS-CoV-2 target nucleic acids are NOT DETECTED. The SARS-CoV-2 RNA is generally detectable in upper and lower  respiratory specimens during the acute phase of infection. The lowest  concentration of SARS-CoV-2 viral copies this assay can detect is 250  copies / mL. A negative result does not preclude SARS-CoV-2 infection  and should not be used as the sole basis for treatment or other  patient management decisions.  A negative result may occur with  improper specimen collection / handling, submission of  specimen other  than nasopharyngeal swab, presence of viral mutation(s) within the  areas targeted by this assay, and inadequate number of viral copies  (<250 copies / mL). A negative result must be combined with clinical  observations, patient history, and epidemiological information. If result is POSITIVE SARS-CoV-2 target nucleic acids are DETECTED. The SARS-CoV-2 RNA is generally detectable in upper and lower  respiratory specimens dur ing the acute phase of infection.  Positive  results are indicative of active infection with SARS-CoV-2.  Clinical  correlation with patient history and other diagnostic information is  necessary to determine patient infection status.  Positive results do  not rule out bacterial infection or co-infection with other viruses. If result is PRESUMPTIVE POSTIVE SARS-CoV-2 nucleic acids MAY BE PRESENT.   A presumptive positive result was  obtained on the submitted specimen  and confirmed on repeat testing.  While 2019 novel coronavirus  (SARS-CoV-2) nucleic acids may be present in the submitted sample  additional confirmatory testing may be necessary for epidemiological  and / or clinical management purposes  to differentiate between  SARS-CoV-2 and other Sarbecovirus currently known to infect humans.  If clinically indicated additional testing with an alternate test  methodology [phone]) is advised. The SARS-CoV-2 RNA is generally  detectable in upper and lower respiratory sp ecimens during the acute  phase of infection. The expected result is Negative. Fact Sheet for Patients:  StrictlyIdeas.no Fact Sheet for Healthcare Providers: BankingDealers.co.za This test is not yet approved or cleared by the [place] FDA and has been authorized for detection and/or diagnosis of SARS-CoV-2 by FDA under an Emergency Use Authorization (EUA).  This EUA will remain in effect (meaning this test can be used) for the  duration of the COVID-19 declaration under Section 564(b)(1) of the Act, 21 U.S.C. section 360bbb-3(b)(1), unless the authorization is terminated or revoked sooner. Performed at [facility], [place] [address] [place], [place] [address]          Radiology Studies: Dg Abd 1 View  Result Date: 04/22/2019 CLINICAL DATA:  Small-bowel obstruction EXAM: ABDOMEN - 1 VIEW COMPARISON:  04/21/2019; 04/19/2019; CT abdomen pelvis-04/18/2019 FINDINGS: Interval placement of enteric tube with side port projected over the expected location of the gastroesophageal junction. Slight decrease in persistent gaseous distention of several loops of small bowel with index loop of small bowel within the left mid hemiabdomen measuring approximately 3.5 cm in diameter. Slight progression of enteric contrast now seen to the level of the distal transverse colon. Nondiagnostic evaluation for pneumoperitoneum secondary to supine positioning and exclusion of the lower thorax. No pneumatosis or portal venous gas. Surgical mesh overlies the left lower abdomen. No definite acute or aggressive osseous abnormalities. IMPRESSION: 1. Enteric tube side port projects over the expected location of the gastroesophageal junction. Advancement at least 8 cm is advised. 2. Suspected slight improvement in small bowel obstruction with decreased gaseous distention of the small bowel and slight progression of enteric contrast, now seen to the level of the distal transverse colon. Electronically Signed   By: Sandi Mariscal M.D.   On: 04/22/2019 07:23   Dg Abd Portable 1v  Result Date: 04/21/2019 CLINICAL DATA:  Evaluate small bowel obstruction. EXAM: PORTABLE ABDOMEN - 1 VIEW COMPARISON:  04/18/2019 CT, 04/19/2019 radiographs FINDINGS: Contrast administered via the NG tube on 04/19/2019 now lies within the colon. There are distended gas-filled loops of small bowel again noted. No other significant change identified. IMPRESSION:  Contrast administered via the NG tube on 04/19/2019 now lies within the colon, with persistent distended small bowel loops, suggesting a partial small bowel obstruction. Electronically Signed   By: Margarette Canada M.D.   On: 04/21/2019 08:34      Scheduled Meds: . hydrALAZINE  10 mg Intravenous Q8H  . mometasone-formoterol  2 puff Inhalation BID  . potassium chloride  20 mEq Oral BID   Continuous Infusions: . dextrose 5 % and 0.9 % NaCl with KCl 40 mEq/L 75 mL/hr at 04/22/19 1124     LOS: 4 days      Debbe Odea, MD [facility] Pager: [email or web address] Password [facility] 04/22/2019, 11:38 AM

## 2019-04-22 NOTE — Progress Notes (Signed)
Call to lab to inquire about labs not being drawn this AM. Spoke with Alvester Chou who states they were unable to obtain blood and they are sending another person to attempt to get blood. Donne Hazel, RN

## 2019-04-22 NOTE — Progress Notes (Signed)
OT Cancellation Note  Patient Details Name: [name] MRN: [id number] DOB: 04/12/1934   Cancelled Treatment:    Reason Eval/Treat Not Completed: Fatigue/lethargy limiting ability to participate.  Pt too fatigued.  Will check back another day.  [place] 04/22/2019, 3:30 PM  Lesle Chris, OTR/L Acute Rehabilitation Services [phone] [facility] pager [phone] office 04/22/2019

## 2019-04-23 DIAGNOSIS — I5042 Chronic combined systolic (congestive) and diastolic (congestive) heart failure: Secondary | ICD-10-CM

## 2019-04-23 DIAGNOSIS — K56609 Unspecified intestinal obstruction, unspecified as to partial versus complete obstruction: Secondary | ICD-10-CM

## 2019-04-23 DIAGNOSIS — N183 Chronic kidney disease, stage 3 (moderate): Secondary | ICD-10-CM

## 2019-04-23 DIAGNOSIS — I10 Essential (primary) hypertension: Secondary | ICD-10-CM

## 2019-04-23 LAB — BASIC METABOLIC PANEL
Anion gap: 10 (ref 5–15)
BUN: 19 mg/dL (ref 8–23)
CO2: 28 mmol/L (ref 22–32)
Calcium: 9.3 mg/dL (ref 8.9–10.3)
Chloride: 110 mmol/L (ref 98–111)
Creatinine, Ser: 1 mg/dL (ref 0.44–1.00)
GFR calc Af Amer: >60 mL/min (ref >60)
GFR calc non Af Amer: 52 mL/min — ABNORMAL LOW (ref >60)
Glucose, Bld: 106 mg/dL — ABNORMAL HIGH (ref 70–99)
Potassium: 3.7 mmol/L (ref 3.5–5.1)
Sodium: 148 mmol/L — ABNORMAL HIGH (ref 135–145)

## 2019-04-23 LAB — CBC
HCT: 35.4 % — ABNORMAL LOW (ref 36.0–46.0)
Hemoglobin: 10.3 g/dL — ABNORMAL LOW (ref 12.0–15.0)
MCH: 28.2 pg (ref 26.0–34.0)
MCHC: 29.1 g/dL — ABNORMAL LOW (ref 30.0–36.0)
MCV: 97 fL (ref 80.0–100.0)
Platelets: 229 10*3/uL (ref 150–400)
RBC: 3.65 MIL/uL — ABNORMAL LOW (ref 3.87–5.11)
RDW: 15.3 % (ref 11.5–15.5)
WBC: 10.7 10*3/uL — ABNORMAL HIGH (ref 4.0–10.5)
nRBC: 0 % (ref 0.0–0.2)

## 2019-04-23 MED ORDER — ENSURE ENLIVE PO LIQD
237.0000 mL | Freq: Two times a day (BID) | ORAL | Status: DC
  Administered 2019-04-23 – 2019-04-26 (×6): 237 mL via ORAL

## 2019-04-23 NOTE — Progress Notes (Signed)
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    Assessment & Plan:   Principal Problem:   SBO (small bowel obstruction) (HCC) Active Problems:   Colostomy in place [facility])   ICD in place   COPD (chronic obstructive pulmonary disease) (HCC)   Dementia (HCC)   Dyslipidemia   CKD (chronic kidney disease) stage 3, GFR 30-59 ml/min (HCC)   Anemia   Benign essential HTN   Essential hypertension   Recurrent parastomal hernia at LLQ colostomy   Chronic combined systolic and diastolic CHF (congestive heart failure) (HCC)   SBO; IMPROVING  , stool in the colostomy.  Some nausea, but no vomiting.  Appreciate surgery recommendations.     H/o rectal cancer and colostomy in place.     Acute on Stage 3 CKD:  Creatinine improved.     Anemia of chronic disease:  Transfuse to keep hemoglobin greater than 7.    Hypertension;  Well controlled.    Hypokalemia Replaced.    Mild hypernatremia:  From free water deficit.  Encourage free water intake.    COPD:  No wheezing heard.    Dementia: stable.      DVT prophylaxis: scd's  Code Status: FULL CODE.  Family Communication: none at bedside.  Disposition Plan: possible d/c to SNF when stable, possibly in 1 to 2 days.    Consultants:   Surgery.    Procedures:none.      Antimicrobials: none.    Subjective: Reports feeling better.   Objective: Vitals:   04/22/19 1424 04/22/19 2037 04/22/19 2152 04/23/19 0535  BP:   (!) 166/66 (!) 147/60  Pulse: 98  (!) 104 99  Resp:   18 17  Temp:   98.9 F (37.2 C)  99.1 F (37.3 C)  TempSrc:   Oral Oral  SpO2:  99% 100% 99%  Weight:      Height:        Intake/Output Summary (Last 24 hours) at 04/23/2019 0744 Last data filed at 04/23/2019 0602 Gross per 24 hour  Intake 1679.51 ml  Output 550 ml  Net 1129.51 ml   Filed Weights   04/18/19 1509  Weight: 98 kg    Examination:  General exam: Appears calm and comfortable  Respiratory system: Clear to auscultation. Respiratory effort normal. Cardiovascular system: S1 & S2 heard, RRR. No JVD, murmurs, rubs, gallops or clicks. No pedal edema. Gastrointestinal system: Abdomen is nondistended, soft and nontender. No organomegaly or masses felt. Normal bowel sounds heard. Central nervous system: Alert and oriented. No focal neurological deficits. Extremities: Symmetric 5 x 5 power. Skin: No rashes, lesions or ulcers Psychiatry: Judgement and insight appear normal. Mood & affect appropriate.     Data Reviewed: I have personally reviewed following labs and imaging studies  CBC: Recent Labs  Lab 04/18/19 1648 04/19/19 0505 04/21/19 0341 04/22/19 0826 04/23/19 0353  WBC 9.1 10.7* 11.1* 13.2* 10.7*  HGB 10.4* 11.7* 10.5* 10.3* 10.3*  HCT 34.4* 38.6 36.2 34.8* 35.4*  MCV 95.3 93.5 96.5 98.0 97.0  PLT 253 247 257 243 244   Basic Metabolic Panel: Recent Labs  Lab 04/18/19 1648 04/19/19 0505 04/21/19 0341 04/22/19 0826 04/23/19  0353  NA 140 143 145 148* 148*  K 4.1 4.3 3.7 3.1* 3.7  CL 97* 100 107 108 110  CO2 32 30 24 26 28   GLUCOSE 104* 91 77 83 106*  BUN 28* 28* 24* 22 19  CREATININE 1.46* 1.29* 1.09* 1.04* 1.00  CALCIUM 10.6* 10.6* 9.7 9.4 9.3  MG  --  2.0  --  2.1  --   PHOS  --  4.3  --   --   --    GFR: Estimated Creatinine Clearance: 50.3 mL/min (by C-G formula based on SCr of 1 mg/dL). Liver Function Tests: Recent Labs  Lab 04/18/19 1648 04/19/19 0505  AST 15 17  ALT 18 15  ALKPHOS 70 71  BILITOT 0.4 0.6  PROT 7.6 7.7  ALBUMIN 4.0 3.8   Recent Labs  Lab  04/18/19 1648  LIPASE 21   No results for input(s): AMMONIA in the last 168 hours. Coagulation Profile: No results for input(s): INR, PROTIME in the last 168 hours. Cardiac Enzymes: No results for input(s): CKTOTAL, CKMB, CKMBINDEX, TROPONINI in the last 168 hours. BNP (last 3 results) No results for input(s): PROBNP in the last 8760 hours. HbA1C: No results for input(s): HGBA1C in the last 72 hours. CBG: No results for input(s): GLUCAP in the last 168 hours. Lipid Profile: No results for input(s): CHOL, HDL, LDLCALC, TRIG, CHOLHDL, LDLDIRECT in the last 72 hours. Thyroid Function Tests: No results for input(s): TSH, T4TOTAL, FREET4, T3FREE, THYROIDAB in the last 72 hours. Anemia Panel: No results for input(s): VITAMINB12, FOLATE, FERRITIN, TIBC, IRON, RETICCTPCT in the last 72 hours. Sepsis Labs: Recent Labs  Lab 04/18/19 1648  LATICACIDVEN 0.9    Recent Results (from the past 240 hour(s))  SARS Coronavirus 2 (CEPHEID - Performed in [place] hospital lab), Hosp Order     Status: None   Collection Time: 04/18/19  4:48 PM   Specimen: Urine, Clean Catch; Nasopharyngeal  Result Value Ref Range Status   SARS Coronavirus 2 NEGATIVE NEGATIVE Final    Comment: (NOTE) If result is NEGATIVE SARS-CoV-2 target nucleic acids are NOT DETECTED. The SARS-CoV-2 RNA is generally detectable in upper and lower  respiratory specimens during the acute phase of infection. The lowest  concentration of SARS-CoV-2 viral copies this assay can detect is 250  copies / mL. A negative result does not preclude SARS-CoV-2 infection  and should not be used as the sole basis for treatment or other  patient management decisions.  A negative result may occur with  improper specimen collection / handling, submission of specimen other  than nasopharyngeal swab, presence of viral mutation(s) within the  areas targeted by this assay, and inadequate number of viral copies  (<250 copies / mL). A negative  result must be combined with clinical  observations, patient history, and epidemiological information. If result is POSITIVE SARS-CoV-2 target nucleic acids are DETECTED. The SARS-CoV-2 RNA is generally detectable in upper and lower  respiratory specimens dur ing the acute phase of infection.  Positive  results are indicative of active infection with SARS-CoV-2.  Clinical  correlation with patient history and other diagnostic information is  necessary to determine patient infection status.  Positive results do  not rule out bacterial infection or co-infection with other viruses. If result is PRESUMPTIVE POSTIVE SARS-CoV-2 nucleic acids MAY BE PRESENT.   A presumptive positive result was obtained on the submitted specimen  and confirmed on repeat testing.  While 2019 novel coronavirus  (SARS-CoV-2) nucleic acids may be  present in the submitted sample  additional confirmatory testing may be necessary for epidemiological  and / or clinical management purposes  to differentiate between  SARS-CoV-2 and other Sarbecovirus currently known to infect humans.  If clinically indicated additional testing with an alternate test  methodology [phone]) is advised. The SARS-CoV-2 RNA is generally  detectable in upper and lower respiratory sp ecimens during the acute  phase of infection. The expected result is Negative. Fact Sheet for Patients:  StrictlyIdeas.no Fact Sheet for Healthcare Providers: BankingDealers.co.za This test is not yet approved or cleared by the [place] FDA and has been authorized for detection and/or diagnosis of SARS-CoV-2 by FDA under an Emergency Use Authorization (EUA).  This EUA will remain in effect (meaning this test can be used) for the duration of the COVID-19 declaration under Section 564(b)(1) of the Act, 21 U.S.C. section 360bbb-3(b)(1), unless the authorization is terminated or revoked sooner. Performed at [facility], [place] [address] [place], [place] [address]          Radiology Studies: Dg Abd 1 View  Result Date: 04/22/2019 CLINICAL DATA:  Small-bowel obstruction EXAM: ABDOMEN - 1 VIEW COMPARISON:  04/21/2019; 04/19/2019; CT abdomen pelvis-04/18/2019 FINDINGS: Interval placement of enteric tube with side port projected over the expected location of the gastroesophageal junction. Slight decrease in persistent gaseous distention of several loops of small bowel with index loop of small bowel within the left mid hemiabdomen measuring approximately 3.5 cm in diameter. Slight progression of enteric contrast now seen to the level of the distal transverse colon. Nondiagnostic evaluation for pneumoperitoneum secondary to supine positioning and exclusion of the lower thorax. No pneumatosis or portal venous gas. Surgical mesh overlies the left lower abdomen. No definite acute or aggressive osseous abnormalities. IMPRESSION: 1. Enteric tube side port projects over the expected location of the gastroesophageal junction. Advancement at least 8 cm is advised. 2. Suspected slight improvement in small bowel obstruction with decreased gaseous distention of the small bowel and slight progression of enteric contrast, now seen to the level of the distal transverse colon. Electronically Signed   By: Sandi Mariscal M.D.   On: 04/22/2019 07:23   Dg Abd Portable 1v  Result Date: 04/21/2019 CLINICAL DATA:  Evaluate small bowel obstruction. EXAM: PORTABLE ABDOMEN - 1 VIEW COMPARISON:  04/18/2019 CT, 04/19/2019 radiographs FINDINGS: Contrast administered via the NG tube on 04/19/2019 now lies within the colon. There are distended gas-filled loops of small bowel again noted. No other significant change identified. IMPRESSION: Contrast administered via the NG tube on 04/19/2019 now lies within the colon, with persistent distended small bowel loops, suggesting a partial small bowel obstruction. Electronically Signed    By: Margarette Canada M.D.   On: 04/21/2019 08:34        Scheduled Meds: . hydrALAZINE  10 mg Intravenous Q8H  . mometasone-formoterol  2 puff Inhalation BID  . potassium chloride  20 mEq Oral BID   Continuous Infusions: . dextrose 5 % and 0.9 % NaCl with KCl 40 mEq/L 25 mL/hr at 04/22/19 1410     LOS: 5 days    Time spent: 28 min    Hosie Poisson, MD [facility] Pager [phone]  If 7PM-7AM, please contact night-coverage [email or web address] Password [facility] 04/23/2019, 7:44 AM

## 2019-04-23 NOTE — Progress Notes (Signed)
Nutrition Follow-up  INTERVENTION:   Provide Ensure Enlive po BID, each supplement provides 350 kcal and 20 grams of protein  NUTRITION DIAGNOSIS:   Inadequate oral intake related to inability to eat as evidenced by NPO status.  Now on full liquids.  GOAL:   Patient will meet greater than or equal to 90% of their needs  Progressing.  MONITOR:   PO intake, Supplement acceptance, Labs, Weight trends, I & O's  REASON FOR ASSESSMENT:   Consult Assessment of nutrition requirement/status  ASSESSMENT:   85 y.o. 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 Presented with abdominal distention, abdominal pain, and N/V x2 days. Patient has colostomy bag and continues to have bowel movements in the colostomy.  **RD working [employer] advanced to full liquids today after tolerating clears. Per surgery consult, plan is to hopefully advance diet to soft diet tomorrow then pt will advance to high fiber eventually at home. Will order Ensure supplements while on liquid diet.   No new weights measured since admission 7/10.  Medications: K-DUR tablet BID Labs reviewed:  Elevated Na  Diet Order:   Diet Order            Diet full liquid Room service appropriate? Yes; Fluid consistency: Thin  Diet effective now              EDUCATION NEEDS:   Not appropriate for education at this time  Skin:  Skin Assessment: Reviewed RN Assessment  Last BM:  7/15  Height:   Ht Readings from Last 1 Encounters:  04/18/19 5\' 6"  (1.676 m)    Weight:   Wt Readings from Last 1 Encounters:  04/18/19 98 kg    Ideal Body Weight:  59.1 kg  BMI:  Body mass index is 34.86 kg/m.  Estimated Nutritional Needs:   Kcal:  1765-1960 kcal  Protein:  65-75 grams  Fluid:  >/= 2  L/day  Shelby Bibles, MS, RD, LDN [place] [facility] Pager: [phone] After Hours Pager: [phone]

## 2019-04-23 NOTE — Progress Notes (Signed)
Occupational Therapy Treatment Patient Details Name: [name] MRN: [id number] DOB: 1934-07-15 Today's Date: 04/23/2019    History of present illness Pt is an 85 year old woman admitted 04/18/19 with vomiting x 2 days, + SBO. PMH: SBO, colostomy, rectal ca, CKD, gout, ICD, HTN, COPD, vertigo, dementia, CHF, gout.   OT comments  Pt is from [facility]; will likely need SNF unless they can provide needed assistance  Follow Up Recommendations  SNF;Supervision/Assistance - 24 hour    Equipment Recommendations  3 in 1 bedside commode    Recommendations for Other Services      Precautions / Restrictions Precautions Precautions: Fall Precaution Comments: colostomy Restrictions Weight Bearing Restrictions: No       Mobility Bed Mobility     Rolling: Mod assist Sidelying to sit: Mod assist       General bed mobility comments: assist for trunk to roll and sit up from sidelying. Pt used rail to help with sitting up  Transfers   Equipment used: Rolling walker (2 wheeled)   Sit to Stand: Mod assist Stand pivot transfers: Min assist       General transfer comment: mod A to power up; cues for hand placement.  Steadying assistance and cues for safety for ambulating 5' to chair    Balance             Standing balance-Leahy Scale: Poor                             ADL either performed or assessed with clinical judgement   ADL                           Toilet Transfer: Moderate assistance;Stand-pivot;BSC;RW             General ADL Comments: pt got OOB and tried to use 3:1 commode. She was unable to void.  Took severals stps to chair; agreeable to sitting up.       Vision       Perception     Praxis      Cognition Arousal/Alertness: Awake/alert Behavior During Therapy: WFL for tasks assessed/performed                                   General Comments: pt with hx of dementia, following commands with  increased time        Exercises     Shoulder Instructions       General Comments      Pertinent Vitals/ Pain       Pain Assessment: No/denies pain  Home Living                                          Prior Functioning/Environment              Frequency  Min 2X/week        Progress Toward Goals  OT Goals(current goals can now be found in the care plan section)  Progress towards OT goals: Progressing toward goals     Plan      Co-evaluation                 AM-PAC OT "6 Clicks" Daily Activity  Outcome Measure   Help from another person eating meals?: A Little Help from another person taking care of personal grooming?: A Little Help from another person toileting, which includes using toliet, bedpan, or urinal?: A Lot Help from another person bathing (including washing, rinsing, drying)?: A Lot Help from another person to put on and taking off regular upper body clothing?: A Little Help from another person to put on and taking off regular lower body clothing?: Total 6 Click Score: 14    End of Session    OT Visit Diagnosis: Unsteadiness on feet (R26.81);Other abnormalities of gait and mobility (R26.89);Pain;Muscle weakness (generalized) (M62.81);Other symptoms and signs involving cognitive function   Activity Tolerance Patient tolerated treatment well   Patient Left in chair;with call bell/phone within reach;with chair alarm set   Nurse Communication Mobility status(check arm band for tightness)        Time: 4696-2952 OT Time Calculation (min): 19 min  Charges: OT General Charges $OT Visit: 1 Visit OT Treatments $Self Care/Home Management : 8-22 mins  [name], OTR/L Acute Rehabilitation Services [phone] [place] pager [phone] office 04/23/2019   [name] 04/23/2019, 10:44 AM

## 2019-04-23 NOTE — TOC Progression Note (Signed)
Transition of Care [facility]) - Progression Note    Patient Details  Name: [name] MRN: [id number] Date of Birth: [birth year]  Transition of Care [facility]) CM/SW Contact  Servando Snare, [place] Phone Number: 04/23/2019, 11:31 AM  Clinical Narrative:   Patient agreeable to SNF for rehab. Patient chose bed at [place] care. LCSW left message for patients son, [name].     Expected Discharge Plan: [place] Barriers to Discharge: Continued Medical Work up  Expected Discharge Plan and Services Expected Discharge Plan: [place] In-house Referral: NA Discharge Planning Services: NA Post Acute Care Choice: [place] Living arrangements for the past 2 months: Assisted Living Facility                 DME Arranged: N/A DME Agency: NA       HH Arranged: NA HH Agency: NA         Social Determinants of Health (SDOH) Interventions    Readmission Risk Interventions Readmission Risk Prevention Plan 04/21/2019  Transportation Screening Complete  HRI or [place] Complete  Social Work Consult for [place] Planning/Counseling Complete  Some recent data might be hidden

## 2019-04-23 NOTE — Progress Notes (Addendum)
    CC: Abdominal pain  Subjective: Patient is taking clear liquids well.  Her back is again full of stool and gas.  She looks and feels much better.  She is much more alert.    She is aware that they have recommended she not go home alone.    Will defer to case management and social worker on this. Objective: Vital signs in last 24 hours: Temp:  [98.9 F (37.2 C)-99.2 F (37.3 C)] 99.1 F (37.3 C) (07/15 0535) Pulse Rate:  [98-111] 99 (07/15 0535) Resp:  [17-18] 17 (07/15 0535) BP: (147-166)/(60-67) 147/60 (07/15 0535) SpO2:  [99 %-100 %] 99 % (07/15 0535) Last BM Date: 04/22/19 1080 PO 600 IV 200 urine recorded TM 99.1 Tachycardic at times Na 148, K+ 3.7 Creatinine 1.0 WBC improving   Intake/Output from previous day: 07/14 0701 - 07/15 0700 In: 1679.5 [P.O.:1080; I.V.:599.5] Out: 550 [Urine:200; Emesis/NG output:350] Intake/Output this shift: No intake/output data recorded.  General appearance: alert, cooperative and no distress Resp: clear to auscultation bilaterally GI: Soft nontender positive bowel sounds.  Ostomy bag is full of stool, and gas.  Lab Results:  Recent Labs    04/22/19 0826 04/23/19 0353  WBC 13.2* 10.7*  HGB 10.3* 10.3*  HCT 34.8* 35.4*  PLT 243 229    BMET Recent Labs    04/22/19 0826 04/23/19 0353  NA 148* 148*  K 3.1* 3.7  CL 108 110  CO2 26 28  GLUCOSE 83 106*  BUN 22 19  CREATININE 1.04* 1.00  CALCIUM 9.4 9.3   PT/INR No results for input(s): LABPROT, INR in the last 72 hours.  Recent Labs  Lab 04/18/19 1648 04/19/19 0505  AST 15 17  ALT 18 15  ALKPHOS 70 71  BILITOT 0.4 0.6  PROT 7.6 7.7  ALBUMIN 4.0 3.8     Lipase     Component Value Date/Time   LIPASE 21 04/18/2019 1648     Medications: . hydrALAZINE  10 mg Intravenous Q8H  . mometasone-formoterol  2 puff Inhalation BID  . potassium chloride  20 mEq Oral BID    Assessment/Plan Chronic kidney disease Hx combined systolic/diastolic CHF with  ICD Dementia Anemia Hypertension Hypothyroid Gout COVID negative Hypokalemia   Small bowel obstruction History of rectal carcinoma with abdominal perineal resection 2006; parastomal hernia repair 2007; cholecystectomy, abdominal hysterectomy - SBP contrast in colon after 50 plus hours/still distended and tender  FEN: No IV fluids listed/n.p.o. ID: None DVT: SCDs only- she can have Lovenox/heparin forDVT prophylaxis from our standpoint Follow-up: To be determined POC: [name],[name] Son [phone]   Plan: Advance to full liquid diet.  Nutrition consult.    Notes from social work shows that patient's being evaluated for SNF placement.    I talked with the son and she has had this same issue in the past also.  He ask about diet after going home and we will plan soft diet for the next week then high fiber diet after that.    He does not know if constipation is an issue in the SNF.     LOS: 5 days   JENNINGS,WILLARD 04/23/2019 [phone]  Agree with above. She is tolerating her diet and her colostomy is working.  Alphonsa Overall, MD, [facility] Surgery Pager: [phone] Office phone:  [phone]

## 2019-04-24 LAB — BASIC METABOLIC PANEL
Anion gap: 10 (ref 5–15)
BUN: 15 mg/dL (ref 8–23)
CO2: 26 mmol/L (ref 22–32)
Calcium: 9.1 mg/dL (ref 8.9–10.3)
Chloride: 107 mmol/L (ref 98–111)
Creatinine, Ser: 1 mg/dL (ref 0.44–1.00)
GFR calc Af Amer: >60 mL/min (ref >60)
GFR calc non Af Amer: 52 mL/min — ABNORMAL LOW (ref >60)
Glucose, Bld: 108 mg/dL — ABNORMAL HIGH (ref 70–99)
Potassium: 3.3 mmol/L — ABNORMAL LOW (ref 3.5–5.1)
Sodium: 143 mmol/L (ref 135–145)

## 2019-04-24 MED ORDER — ENOXAPARIN SODIUM 40 MG/0.4ML ~~LOC~~ SOLN
40.0000 mg | SUBCUTANEOUS | Status: DC
  Administered 2019-04-24 – 2019-04-26 (×3): 40 mg via SUBCUTANEOUS
  Filled 2019-04-24: qty 0.4

## 2019-04-24 MED ORDER — SENNOSIDES-DOCUSATE SODIUM 8.6-50 MG PO TABS
1.0000 | ORAL_TABLET | Freq: Two times a day (BID) | ORAL | Status: DC
  Administered 2019-04-24 – 2019-04-26 (×5): 1 via ORAL
  Filled 2019-04-24 (×5): qty 1

## 2019-04-24 NOTE — TOC Progression Note (Signed)
Transition of Care [facility]) - Progression Note    Patient Details  Name: TEDDI BADALAMENTI MRN: [id number] Date of Birth: [birth year]  Transition of Care [facility]) CM/SW Contact  Shade Flood, LCSW Phone Number: 04/24/2019, 2:38 PM  Clinical Narrative:     TOC following. Plan remains for transfer to [facility] for rehab once medically stable. Updated Dr. Karleen Hampshire that pt will need a new COVID test within 3 days of dc so she could order accordingly. Auth started with [facility] today. Faxed requested clinical to [phone].   TOC will follow.  Expected Discharge Plan: [place] Barriers to Discharge: Continued Medical Work up  Expected Discharge Plan and Services Expected Discharge Plan: [place] In-house Referral: NA Discharge Planning Services: NA Post Acute Care Choice: [place] Living arrangements for the past 2 months: Assisted Living Facility                 DME Arranged: N/A DME Agency: NA       HH Arranged: NA HH Agency: NA         Social Determinants of Health (SDOH) Interventions    Readmission Risk Interventions Readmission Risk Prevention Plan 04/21/2019  Transportation Screening Complete  HRI or [place] Complete  Social Work Consult for [place] Planning/Counseling Complete  Some recent data might be hidden

## 2019-04-24 NOTE — Progress Notes (Signed)
PROGRESS NOTE    [name]  YIF:[id number] DOB: [birth year] DOA: 04/18/2019 PCP: [name], PA   Brief Narrative:   [name] is a 85 y.o.femalewith medical history significant of small bowel obstruction CKD gout, rectal cancer, colostomy with recurrent parastomal hernia at left lower, dementia, COPD,chronic systolic and diastolicHF,statuspost ICD, hypertension, hypothyroidism, hyperlipidemia who presents for vomiting x 2 days.    Assessment & Plan:   Principal Problem:   SBO (small bowel obstruction) (HCC) Active Problems:   Colostomy in place [facility])   ICD in place   COPD (chronic obstructive pulmonary disease) (HCC)   Dementia (HCC)   Dyslipidemia   CKD (chronic kidney disease) stage 3, GFR 30-59 ml/min (HCC)   Anemia   Benign essential HTN   Essential hypertension   Recurrent parastomal hernia at LLQ colostomy   Chronic combined systolic and diastolic CHF (congestive heart failure) ([place])   SBO; Improving,  stool in the colostomy.  Some nausea, but no vomiting. Advanced diet to soft and further recommendations as  Per surgery.    H/o rectal cancer and colostomy in place.     Acute on Stage 3 CKD:  Creatinine improved.     Anemia of chronic disease:  Transfuse to keep hemoglobin greater than 7.    Hypertension;  Well controlled.    Hypokalemia Replaced.    Mild hypernatremia:  resolved From free water deficit.  Encourage free water intake.    COPD:  No wheezing heard.    Dementia: stable.      DVT prophylaxis: scd's  Code Status: FULL CODE.  Family Communication: none at bedside.  Disposition Plan: possible d/c to SNF when stable, possibly in 1 to 2 days.    Consultants:   Surgery.    Procedures:none.      Antimicrobials: none.    Subjective: Reports feeling better. No nausea or vomiting, feeling very weak.   Objective: Vitals:   04/23/19 2105 04/24/19 0512 04/24/19 0712 04/24/19 1426  BP: (!) 147/56 (!)  156/64  (!) 148/66  Pulse: (!) 102 91  87  Resp: 18 20  20   Temp: 97.8 F (36.6 C) 98.6 F (37 C)  98.4 F (36.9 C)  TempSrc: Oral Oral  Oral  SpO2: 98% 100% 97% 98%  Weight:      Height:        Intake/Output Summary (Last 24 hours) at 04/24/2019 1545 Last data filed at 04/24/2019 1443 Gross per 24 hour  Intake 1080 ml  Output 665 ml  Net 415 ml   Filed Weights   04/18/19 1509  Weight: 98 kg    Examination:  General exam: Appears calm and comfortable  Respiratory system: Clear to auscultation. Respiratory effort normal. Cardiovascular system: S1 & S2 heard, RRR. No JVD, Gastrointestinal system: Abdomen is soft, non tender, colostomy bag with liquid green stool.  Central nervous system: Alert and oriented. No focal neurological deficits. Extremities: Symmetric 5 x 5 power. Skin: No rashes, lesions or ulcers Psychiatry:Mood & affect appropriate.     Data Reviewed: I have personally reviewed following labs and imaging studies  CBC: Recent Labs  Lab 04/18/19 1648 04/19/19 0505 04/21/19 0341 04/22/19 0826 04/23/19 0353  WBC 9.1 10.7* 11.1* 13.2* 10.7*  HGB 10.4* 11.7* 10.5* 10.3* 10.3*  HCT 34.4* 38.6 36.2 34.8* 35.4*  MCV 95.3 93.5 96.5 98.0 97.0  PLT 253 247 257 243 867   Basic Metabolic Panel: Recent Labs  Lab 04/19/19 0505 04/21/19 0341 04/22/19 0826 04/23/19 0353 04/24/19  0901  NA 143 145 148* 148* 143  K 4.3 3.7 3.1* 3.7 3.3*  CL 100 107 108 110 107  CO2 30 24 26 28 26   GLUCOSE 91 77 83 106* 108*  BUN 28* 24* 22 19 15   CREATININE 1.29* 1.09* 1.04* 1.00 1.00  CALCIUM 10.6* 9.7 9.4 9.3 9.1  MG 2.0  --  2.1  --   --   PHOS 4.3  --   --   --   --    GFR: Estimated Creatinine Clearance: 50.3 mL/min (by C-G formula based on SCr of 1 mg/dL). Liver Function Tests: Recent Labs  Lab 04/18/19 1648 04/19/19 0505  AST 15 17  ALT 18 15  ALKPHOS 70 71  BILITOT 0.4 0.6  PROT 7.6 7.7  ALBUMIN 4.0 3.8   Recent Labs  Lab 04/18/19 1648  LIPASE 21    No results for input(s): AMMONIA in the last 168 hours. Coagulation Profile: No results for input(s): INR, PROTIME in the last 168 hours. Cardiac Enzymes: No results for input(s): CKTOTAL, CKMB, CKMBINDEX, TROPONINI in the last 168 hours. BNP (last 3 results) No results for input(s): PROBNP in the last 8760 hours. HbA1C: No results for input(s): HGBA1C in the last 72 hours. CBG: No results for input(s): GLUCAP in the last 168 hours. Lipid Profile: No results for input(s): CHOL, HDL, LDLCALC, TRIG, CHOLHDL, LDLDIRECT in the last 72 hours. Thyroid Function Tests: No results for input(s): TSH, T4TOTAL, FREET4, T3FREE, THYROIDAB in the last 72 hours. Anemia Panel: No results for input(s): VITAMINB12, FOLATE, FERRITIN, TIBC, IRON, RETICCTPCT in the last 72 hours. Sepsis Labs: Recent Labs  Lab 04/18/19 1648  LATICACIDVEN 0.9    Recent Results (from the past 240 hour(s))  SARS Coronavirus 2 (CEPHEID - Performed in [place] hospital lab), Hosp Order     Status: None   Collection Time: 04/18/19  4:48 PM   Specimen: Urine, Clean Catch; Nasopharyngeal  Result Value Ref Range Status   SARS Coronavirus 2 NEGATIVE NEGATIVE Final    Comment: (NOTE) If result is NEGATIVE SARS-CoV-2 target nucleic acids are NOT DETECTED. The SARS-CoV-2 RNA is generally detectable in upper and lower  respiratory specimens during the acute phase of infection. The lowest  concentration of SARS-CoV-2 viral copies this assay can detect is 250  copies / mL. A negative result does not preclude SARS-CoV-2 infection  and should not be used as the sole basis for treatment or other  patient management decisions.  A negative result may occur with  improper specimen collection / handling, submission of specimen other  than nasopharyngeal swab, presence of viral mutation(s) within the  areas targeted by this assay, and inadequate number of viral copies  (<250 copies / mL). A negative result must be combined with  clinical  observations, patient history, and epidemiological information. If result is POSITIVE SARS-CoV-2 target nucleic acids are DETECTED. The SARS-CoV-2 RNA is generally detectable in upper and lower  respiratory specimens dur ing the acute phase of infection.  Positive  results are indicative of active infection with SARS-CoV-2.  Clinical  correlation with patient history and other diagnostic information is  necessary to determine patient infection status.  Positive results do  not rule out bacterial infection or co-infection with other viruses. If result is PRESUMPTIVE POSTIVE SARS-CoV-2 nucleic acids MAY BE PRESENT.   A presumptive positive result was obtained on the submitted specimen  and confirmed on repeat testing.  While 2019 novel coronavirus  (SARS-CoV-2) nucleic acids may be  present in the submitted sample  additional confirmatory testing may be necessary for epidemiological  and / or clinical management purposes  to differentiate between  SARS-CoV-2 and other Sarbecovirus currently known to infect humans.  If clinically indicated additional testing with an alternate test  methodology [id number]) is advised. The SARS-CoV-2 RNA is generally  detectable in upper and lower respiratory sp ecimens during the acute  phase of infection. The expected result is Negative. Fact Sheet for Patients:  StrictlyIdeas.no Fact Sheet for Healthcare Providers: BankingDealers.co.za This test is not yet approved or cleared by the [place] FDA and has been authorized for detection and/or diagnosis of SARS-CoV-2 by FDA under an Emergency Use Authorization (EUA).  This EUA will remain in effect (meaning this test can be used) for the duration of the COVID-19 declaration under Section 564(b)(1) of the Act, 21 U.S.C. section 360bbb-3(b)(1), unless the authorization is terminated or revoked sooner. Performed at [facility],  [place] [address] [place], [place] [address]          Radiology Studies: No results found.      Scheduled Meds: . enoxaparin (LOVENOX) injection  40 mg Subcutaneous Q24H  . feeding supplement (ENSURE ENLIVE)  237 mL Oral BID BM  . hydrALAZINE  10 mg Intravenous Q8H  . mometasone-formoterol  2 puff Inhalation BID  . potassium chloride  20 mEq Oral BID  . senna-docusate  1 tablet Oral BID   Continuous Infusions:    LOS: 6 days    Time spent: 28 min    Hosie Poisson, MD [facility] Pager [phone]  If 7PM-7AM, please contact night-coverage [email or web address] Password [facility] 04/24/2019, 3:45 PM

## 2019-04-24 NOTE — Progress Notes (Addendum)
CC: Abdominal pain  Subjective: Patient seems to be doing okay.  She clearly does not like the full liquids (Ensure).  I can't tell if she had discomfort with the full liquid or if she just did not like them.   Nursing staff says she did okay with full liquids yesterday.  There is no indication that there was any problems with her colostomy drainage yesterday.    Today she is full gas and there is some liquid greenish colored stool feculent material in the bag but nothing like the last 2 days.  Objective: Vital signs in last 24 hours: Temp:  [97.8 F (36.6 C)-98.8 F (37.1 C)] 98.6 F (37 C) (07/16 0512) Pulse Rate:  [87-102] 91 (07/16 0512) Resp:  [17-20] 20 (07/16 0512) BP: (144-156)/(56-72) 156/64 (07/16 0512) SpO2:  [97 %-100 %] 97 % (07/16 0712) Last BM Date: 04/23/19 600 PO recorded 44 IV 600 urine recorded 165 stool recorded Afebrile, VSS Labs stable yesterday No imaging  Intake/Output from previous day: 07/15 0701 - 07/16 0700 In: 644.7 [P.O.:600; I.V.:44.7] Out: 765 [Urine:600; Stool:165] Intake/Output this shift: No intake/output data recorded.  General appearance: alert, cooperative, no distress and she was pretty happy yestereday, today she seems more reserved and less happy.   Resp: clear to auscultation bilaterally GI: soft, some distension, gas and liquid stool in the ostomy this AM, not nearly the volume she has had in the past.  Lab Results:  Recent Labs    04/22/19 0826 04/23/19 0353  WBC 13.2* 10.7*  HGB 10.3* 10.3*  HCT 34.8* 35.4*  PLT 243 229    BMET Recent Labs    04/22/19 0826 04/23/19 0353  NA 148* 148*  K 3.1* 3.7  CL 108 110  CO2 26 28  GLUCOSE 83 106*  BUN 22 19  CREATININE 1.04* 1.00  CALCIUM 9.4 9.3   PT/INR No results for input(s): LABPROT, INR in the last 72 hours.  Recent Labs  Lab 04/18/19 1648 04/19/19 0505  AST 15 17  ALT 18 15  ALKPHOS 70 71  BILITOT 0.4 0.6  PROT 7.6 7.7  ALBUMIN 4.0 3.8      Lipase     Component Value Date/Time   LIPASE 21 04/18/2019 1648     Medications: . feeding supplement (ENSURE ENLIVE)  237 mL Oral BID BM  . hydrALAZINE  10 mg Intravenous Q8H  . mometasone-formoterol  2 puff Inhalation BID  . potassium chloride  20 mEq Oral BID    Assessment/Plan Chronic kidney disease Hx combined systolic/diastolic CHF with ICD Dementia Anemia Hypertension Hypothyroid Gout COVID negative Hypokalemia   Small bowel obstruction History of rectal carcinoma with abdominal perineal resection 2006; parastomal hernia repair 2007; cholecystectomy, abdominal hysterectomy SBP contrast in colon after 50 plus hours  FEN: No IV fluids listed/n.p.o. ID: None DVT: SCDs only- she can have Lovenox/heparin forDVT prophylaxis from our standpoint Follow-up: To be determined POC: [name],[name] Son [phone]    Plan:  I am going to try her on a soft diet, add colace, when she did have stool 2 prior days it was pretty hard.  Add Senakot also.  She may need to be on fiber supplement also, but will give her a little more time before recommending that.        LOS: 6 days   JENNINGS,WILLARD 04/24/2019 [phone]  Agree with above. She is mildly distended, but her colostomy is working well. She said that she is hungry. To start DVT prophylaxis  Shanon Brow  Lucia Gaskins, MD, [facility] Pager: [phone] Office phone:  [phone]

## 2019-04-24 NOTE — Care Management Important Message (Signed)
Important Message  Patient Details IM Letter given to Marlou Starks RN to present to the Patient Name: [name] MRN: [id number] Date of Birth: 03-04-1934   Medicare Important Message Given:  Yes     Kerin Salen 04/24/2019, 10:15 AM

## 2019-04-24 NOTE — Progress Notes (Signed)
Physical Therapy Treatment Patient Details Name: [name] MRN: [id number] DOB: Jan 29, 1934 Today's Date: 04/24/2019    History of Present Illness Pt is an 85 year old woman admitted 04/18/19 with vomiting x 2 days, + SBO. PMH: SBO, colostomy, rectal ca, CKD, gout, ICD, HTN, COPD, vertigo, dementia, CHF, gout.    PT Comments    Pt assisted with ambulating in hallway and able to progress distance a little today.  Pt reports overall feeling better and requesting water (RN okayed) so brought water pitcher to room.  Continue to recommend SNF upon d/c as pt requiring assist for mobility at this time.   Follow Up Recommendations  SNF;Supervision/Assistance - 24 hour     Equipment Recommendations  None recommended by PT    Recommendations for Other Services       Precautions / Restrictions Precautions Precautions: Fall Precaution Comments: colostomy    Mobility  Bed Mobility               General bed mobility comments: pt up in recliner on arrival  Transfers Overall transfer level: Needs assistance Equipment used: Rolling walker (2 wheeled) Transfers: Sit to/from Stand Sit to Stand: Mod assist         General transfer comment: mod assist to rise and steady, posterior bias upon standing, cues for safe technique  Ambulation/Gait Ambulation/Gait assistance: Min assist Gait Distance (Feet): 40 Feet Assistive device: Rolling walker (2 wheeled) Gait Pattern/deviations: Step-through pattern;Decreased stride length;Trunk flexed     General Gait Details: verbal cues for RW positioning, assist for negotiating RW with turns, limited distance due to fatigue   Stairs             Wheelchair Mobility    Modified Rankin (Stroke Patients Only)       Balance                                            Cognition Arousal/Alertness: Awake/alert Behavior During Therapy: WFL for tasks assessed/performed Overall Cognitive Status: History of  cognitive impairments - at baseline                                 General Comments: pt with hx of dementia, following commands with increased time      Exercises      General Comments        Pertinent Vitals/Pain Pain Assessment: No/denies pain    Home Living                      Prior Function            PT Goals (current goals can now be found in the care plan section) Progress towards PT goals: Progressing toward goals    Frequency    Min 2X/week      PT Plan Current plan remains appropriate    Co-evaluation              AM-PAC PT "6 Clicks" Mobility   Outcome Measure  Help needed turning from your back to your side while in a flat bed without using bedrails?: A Lot Help needed moving from lying on your back to sitting on the side of a flat bed without using bedrails?: A Lot Help needed moving to and from a bed to a chair (  including a wheelchair)?: A Lot Help needed standing up from a chair using your arms (e.g., wheelchair or bedside chair)?: A Lot Help needed to walk in hospital room?: A Little Help needed climbing 3-5 steps with a railing? : A Lot 6 Click Score: 13    End of Session Equipment Utilized During Treatment: Gait belt Activity Tolerance: Patient limited by fatigue Patient left: in chair;with call bell/phone within reach;with chair alarm set Nurse Communication: Mobility status PT Visit Diagnosis: Muscle weakness (generalized) (M62.81);Difficulty in walking, not elsewhere classified (R26.2)     Time: 6256-3893 PT Time Calculation (min) (ACUTE ONLY): 18 min  Charges:  $Gait Training: 8-22 mins            Carmelia Bake, PT, DPT Acute Rehabilitation Services Office: [phone] Pager: [phone]          Trena Platt 04/24/2019, 2:22 PM

## 2019-04-25 LAB — BASIC METABOLIC PANEL
Anion gap: 10 (ref 5–15)
BUN: 17 mg/dL (ref 8–23)
CO2: 25 mmol/L (ref 22–32)
Calcium: 9 mg/dL (ref 8.9–10.3)
Chloride: 105 mmol/L (ref 98–111)
Creatinine, Ser: 1.13 mg/dL — ABNORMAL HIGH (ref 0.44–1.00)
GFR calc Af Amer: 52 mL/min — ABNORMAL LOW (ref >60)
GFR calc non Af Amer: 45 mL/min — ABNORMAL LOW (ref >60)
Glucose, Bld: 99 mg/dL (ref 70–99)
Potassium: 4.1 mmol/L (ref 3.5–5.1)
Sodium: 140 mmol/L (ref 135–145)

## 2019-04-25 NOTE — Progress Notes (Addendum)
    CC: Abdominal pain  Subjective: Patient seems was taking a soft diet without any issue.  She still has some hard stool in her colostomy, and some liquid.  She walked 40 feet with PT yesterday.  Objective: Vital signs in last 24 hours: Temp:  [98.1 F (36.7 C)-98.6 F (37 C)] 98.6 F (37 C) (07/17 0557) Pulse Rate:  [87-101] 93 (07/17 0557) Resp:  [18-20] 18 (07/17 0557) BP: (120-160)/(55-73) 120/55 (07/17 0557) SpO2:  [98 %-100 %] 98 % (07/17 0557) Last BM Date: 04/24/19 1800 PO Urine 900 Stool 150 recorded Afebrile, VSS COVID pending  Intake/Output from previous day: 07/16 0701 - 07/17 0700 In: 1800 [P.O.:1800] Out: 1050 [Urine:900; Stool:150] Intake/Output this shift: No intake/output data recorded.  General appearance: alert, cooperative and no distress GI: Soft, minimally distended, ostomy is working with stool in the pouch.  Lab Results:  Recent Labs    04/22/19 0826 04/23/19 0353  WBC 13.2* 10.7*  HGB 10.3* 10.3*  HCT 34.8* 35.4*  PLT 243 229    BMET Recent Labs    04/23/19 0353 04/24/19 0901  NA 148* 143  K 3.7 3.3*  CL 110 107  CO2 28 26  GLUCOSE 106* 108*  BUN 19 15  CREATININE 1.00 1.00  CALCIUM 9.3 9.1   PT/INR No results for input(s): LABPROT, INR in the last 72 hours.  Recent Labs  Lab 04/18/19 1648 04/19/19 0505  AST 15 17  ALT 18 15  ALKPHOS 70 71  BILITOT 0.4 0.6  PROT 7.6 7.7  ALBUMIN 4.0 3.8     Lipase     Component Value Date/Time   LIPASE 21 04/18/2019 1648     Medications: . enoxaparin (LOVENOX) injection  40 mg Subcutaneous Q24H  . feeding supplement (ENSURE ENLIVE)  237 mL Oral BID BM  . hydrALAZINE  10 mg Intravenous Q8H  . mometasone-formoterol  2 puff Inhalation BID  . potassium chloride  20 mEq Oral BID  . senna-docusate  1 tablet Oral BID    Assessment/Plan Chronic kidney disease Hx combined systolic/diastolic CHF with ICD Dementia Anemia Hypertension Hypothyroid Gout COVID  negative Hypokalemia   Small bowel obstruction History of rectal carcinoma with abdominal perineal resection 2006; parastomal hernia repair 2007; cholecystectomy, abdominal hysterectomy SBP contrast in colon after 50 plus hours  FEN: soft diet ID: None DVT: SCD/lovenox Follow-up: PCP POC: [name],[name] Son [phone]    Plan: Recommend continued soft diet for the next several days and then place her on a high-fiber diet with bowel regime at the skilled nursing facility.    Please call if we can be of any further assistance.   LOS: 7 days    JENNINGS,WILLARD 04/25/2019 [phone]  Agree with above. SBO resolved.  No surgical issues. No reason to follow up with [place].  Alphonsa Overall, MD, [facility] Pager: [phone] Office phone:  [phone]

## 2019-04-25 NOTE — Progress Notes (Signed)
PROGRESS NOTE    DANESE [name]  HUT:[id number] DOB: 05-Jan-1934 DOA: 04/18/2019 PCP: [name], PA   Brief Narrative:   [name] is a 85 y.o.femalewith medical history significant of small bowel obstruction CKD gout, rectal cancer, colostomy with recurrent parastomal hernia at left lower, dementia, COPD,chronic systolic and diastolicHF,statuspost ICD, hypertension, hypothyroidism, hyperlipidemia who presents for vomiting x 2 days.    Assessment & Plan:   Principal Problem:   SBO (small bowel obstruction) (HCC) Active Problems:   Colostomy in place [facility])   ICD in place   COPD (chronic obstructive pulmonary disease) (HCC)   Dementia (HCC)   Dyslipidemia   CKD (chronic kidney disease) stage 3, GFR 30-59 ml/min (HCC)   Anemia   Benign essential HTN   Essential hypertension   Recurrent parastomal hernia at [facility] colostomy   Chronic combined systolic and diastolic CHF (congestive heart failure) ([place])   SBO; Improving,  stool in the colostomy.   Advanced diet to soft and further recommendations as  Per surgery.  No new complaints.    H/o rectal cancer and colostomy in place.     Acute on Stage 3 CKD:  Creatinine improved.     Anemia of chronic disease:  Transfuse to keep hemoglobin greater than 7.    Hypertension;  Well controlled.    Hypokalemia Replaced.    Mild hypernatremia:  resolved From free water deficit.  Encourage free water intake.    COPD:  No wheezing heard.    Dementia: stable.    DVT prophylaxis: scd's  Code Status: FULL CODE.  Family Communication: none at bedside.  Disposition Plan: possible d/c to SNF when the covid test is negative, possibly in 1 to 2 days.    Consultants:   Surgery.    Procedures:none.      Antimicrobials: none.    Subjective: No new complaints.   Objective: Vitals:   04/24/19 2008 04/24/19 2027 04/25/19 0557 04/25/19 0751  BP:  (!) 160/73 (!) 120/55   Pulse:  (!) 101 93   Resp:   18 18   Temp:  98.1 F (36.7 C) 98.6 F (37 C)   TempSrc:  Oral Oral   SpO2: 98% 100% 98% 95%  Weight:      Height:        Intake/Output Summary (Last 24 hours) at 04/25/2019 1145 Last data filed at 04/25/2019 1035 Gross per 24 hour  Intake 1560 ml  Output 1050 ml  Net 510 ml   Filed Weights   04/18/19 1509  Weight: 98 kg    Examination:  General exam: Appears calm and comfortable  Respiratory system: Clear to auscultation. Respiratory effort normal. Cardiovascular system: S1 & S2 heard, RRR. No JVD, Gastrointestinal system: Abdomen is soft, non tender, colostomy bag with liquid green stool.  Central nervous system: Alert and oriented. No focal neurological deficits. Extremities: Symmetric 5 x 5 power. Skin: No rashes, lesions or ulcers Psychiatry:Mood & affect appropriate.     Data Reviewed: I have personally reviewed following labs and imaging studies  CBC: Recent Labs  Lab 04/18/19 1648 04/19/19 0505 04/21/19 0341 04/22/19 0826 04/23/19 0353  WBC 9.1 10.7* 11.1* 13.2* 10.7*  HGB 10.4* 11.7* 10.5* 10.3* 10.3*  HCT 34.4* 38.6 36.2 34.8* 35.4*  MCV 95.3 93.5 96.5 98.0 97.0  PLT 253 247 257 243 656   Basic Metabolic Panel: Recent Labs  Lab 04/19/19 0505 04/21/19 0341 04/22/19 0826 04/23/19 0353 04/24/19 0901 04/25/19 0949  NA 143 145 148* 148*  143 140  K 4.3 3.7 3.1* 3.7 3.3* 4.1  CL 100 107 108 110 107 105  CO2 30 24 26 28 26 25   GLUCOSE 91 77 83 106* 108* 99  BUN 28* 24* 22 19 15 17   CREATININE 1.29* 1.09* 1.04* 1.00 1.00 1.13*  CALCIUM 10.6* 9.7 9.4 9.3 9.1 9.0  MG 2.0  --  2.1  --   --   --   PHOS 4.3  --   --   --   --   --    GFR: Estimated Creatinine Clearance: 44.5 mL/min (A) (by C-G formula based on SCr of 1.13 mg/dL (H)). Liver Function Tests: Recent Labs  Lab 04/18/19 1648 04/19/19 0505  AST 15 17  ALT 18 15  ALKPHOS 70 71  BILITOT 0.4 0.6  PROT 7.6 7.7  ALBUMIN 4.0 3.8   Recent Labs  Lab 04/18/19 1648  LIPASE 21   No  results for input(s): AMMONIA in the last 168 hours. Coagulation Profile: No results for input(s): INR, PROTIME in the last 168 hours. Cardiac Enzymes: No results for input(s): CKTOTAL, CKMB, CKMBINDEX, TROPONINI in the last 168 hours. BNP (last 3 results) No results for input(s): PROBNP in the last 8760 hours. HbA1C: No results for input(s): HGBA1C in the last 72 hours. CBG: No results for input(s): GLUCAP in the last 168 hours. Lipid Profile: No results for input(s): CHOL, HDL, LDLCALC, TRIG, CHOLHDL, LDLDIRECT in the last 72 hours. Thyroid Function Tests: No results for input(s): TSH, T4TOTAL, FREET4, T3FREE, THYROIDAB in the last 72 hours. Anemia Panel: No results for input(s): VITAMINB12, FOLATE, FERRITIN, TIBC, IRON, RETICCTPCT in the last 72 hours. Sepsis Labs: Recent Labs  Lab 04/18/19 1648  LATICACIDVEN 0.9    Recent Results (from the past 240 hour(s))  SARS Coronavirus 2 (CEPHEID - Performed in [place] hospital lab), Hosp Order     Status: None   Collection Time: 04/18/19  4:48 PM   Specimen: Urine, Clean Catch; Nasopharyngeal  Result Value Ref Range Status   SARS Coronavirus 2 NEGATIVE NEGATIVE Final    Comment: (NOTE) If result is NEGATIVE SARS-CoV-2 target nucleic acids are NOT DETECTED. The SARS-CoV-2 RNA is generally detectable in upper and lower  respiratory specimens during the acute phase of infection. The lowest  concentration of SARS-CoV-2 viral copies this assay can detect is 250  copies / mL. A negative result does not preclude SARS-CoV-2 infection  and should not be used as the sole basis for treatment or other  patient management decisions.  A negative result may occur with  improper specimen collection / handling, submission of specimen other  than nasopharyngeal swab, presence of viral mutation(s) within the  areas targeted by this assay, and inadequate number of viral copies  (<250 copies / mL). A negative result must be combined with clinical   observations, patient history, and epidemiological information. If result is POSITIVE SARS-CoV-2 target nucleic acids are DETECTED. The SARS-CoV-2 RNA is generally detectable in upper and lower  respiratory specimens dur ing the acute phase of infection.  Positive  results are indicative of active infection with SARS-CoV-2.  Clinical  correlation with patient history and other diagnostic information is  necessary to determine patient infection status.  Positive results do  not rule out bacterial infection or co-infection with other viruses. If result is PRESUMPTIVE POSTIVE SARS-CoV-2 nucleic acids MAY BE PRESENT.   A presumptive positive result was obtained on the submitted specimen  and confirmed on repeat testing.  While  2019 novel coronavirus  (SARS-CoV-2) nucleic acids may be present in the submitted sample  additional confirmatory testing may be necessary for epidemiological  and / or clinical management purposes  to differentiate between  SARS-CoV-2 and other Sarbecovirus currently known to infect humans.  If clinically indicated additional testing with an alternate test  methodology [phone]) is advised. The SARS-CoV-2 RNA is generally  detectable in upper and lower respiratory sp ecimens during the acute  phase of infection. The expected result is Negative. Fact Sheet for Patients:  StrictlyIdeas.no Fact Sheet for Healthcare Providers: BankingDealers.co.za This test is not yet approved or cleared by the [place] FDA and has been authorized for detection and/or diagnosis of SARS-CoV-2 by FDA under an Emergency Use Authorization (EUA).  This EUA will remain in effect (meaning this test can be used) for the duration of the COVID-19 declaration under Section 564(b)(1) of the Act, 21 U.S.C. section 360bbb-3(b)(1), unless the authorization is terminated or revoked sooner. Performed at [facility], [place]  [address], [place], [place] [address]          Radiology Studies: No results found.      Scheduled Meds: . enoxaparin (LOVENOX) injection  40 mg Subcutaneous Q24H  . feeding supplement (ENSURE ENLIVE)  237 mL Oral BID BM  . hydrALAZINE  10 mg Intravenous Q8H  . mometasone-formoterol  2 puff Inhalation BID  . potassium chloride  20 mEq Oral BID  . senna-docusate  1 tablet Oral BID   Continuous Infusions:    LOS: 7 days    Time spent: 28 min    Hosie Poisson, MD [facility] Pager [phone]  If 7PM-7AM, please contact night-coverage [email or web address] Password [facility] 04/25/2019, 11:45 AM

## 2019-04-25 NOTE — TOC Progression Note (Signed)
Transition of Care [facility]) - Progression Note    Patient Details  Name: [name] MRN: [id number] Date of Birth: [birth year]  Transition of Care [facility]) CM/SW [place], LCSW Phone Number: 04/25/2019, 11:28 AM  Clinical Narrative:   Patient has bed at [facility] and has authorization for 5 days. Auth starts today and will need to be reviewed on July 21. Juliann Pulse stated that once patient has a negative covid test she will be able to discharge to [facility] even over the weekend. Authorization information has been give to [facility]    Expected Discharge Plan: [place] Barriers to Discharge: Continued Medical Work up  Expected Discharge Plan and Services Expected Discharge Plan: [place] In-house Referral: NA Discharge Planning Services: NA Post Acute Care Choice: [place] Living arrangements for the past 2 months: [facility]                 DME Arranged: N/A DME Agency: NA       HH Arranged: NA HH Agency: NA         Social Determinants of Health (SDOH) Interventions    Readmission Risk Interventions Readmission Risk Prevention Plan 04/24/2019 04/21/2019  Transportation Screening - Complete  HRI or [place] - Complete  Social Work Consult for [place] Planning/Counseling - Complete  Palliative Care Screening Not Applicable -  Some recent data might be hidden

## 2019-04-25 NOTE — Progress Notes (Signed)
Occupational Therapy Treatment Patient Details Name: [name] MRN: [id number] DOB: May 27, 1934 Today's Date: 04/25/2019    History of present illness Pt is an 85 year old woman admitted 04/18/19 with vomiting x 2 days, + SBO. PMH: SBO, colostomy, rectal ca, CKD, gout, ICD, HTN, COPD, vertigo, dementia, CHF, gout.   OT comments  Pt making steady progress toward goals.  She requires min A - max A for ADLs and min - mod A for functional mobility.    Follow Up Recommendations  SNF;Supervision/Assistance - 24 hour    Equipment Recommendations  3 in 1 bedside commode    Recommendations for Other Services      Precautions / Restrictions Precautions Precautions: Fall Precaution Comments: colostomy       Mobility Bed Mobility               General bed mobility comments: pt sitting in recliner   Transfers Overall transfer level: Needs assistance Equipment used: Rolling walker (2 wheeled) Transfers: Sit to/from [facility] Sit to Stand: Min assist Stand pivot transfers: Min assist       General transfer comment: requires assist for forward translation of trunk and assist to boost hips     Balance Overall balance assessment: Needs assistance Sitting-balance support: Feet supported Sitting balance-Leahy Scale: Fair     Standing balance support: During functional activity;Single extremity supported Standing balance-Leahy Scale: Fair Standing balance comment: close min guard assist                            ADL either performed or assessed with clinical judgement   ADL Overall ADL's : Needs assistance/impaired Eating/Feeding: NPO   Grooming: Wash/dry hands;Oral care;Minimal assistance;Standing Grooming Details (indicate cue type and reason): verbal cues for thoroughness, and assist for sequencing and organization                  Toilet Transfer: Minimal assistance;Ambulation;Comfort height toilet;Regular Toilet;Grab bars;RW            Functional mobility during ADLs: Minimal assistance       Vision       Perception     Praxis      Cognition Arousal/Alertness: Awake/alert Behavior During Therapy: WFL for tasks assessed/performed Overall Cognitive Status: History of cognitive impairments - at baseline                                 General Comments: pt with hx of dementia, following commands with increased time        Exercises     Shoulder Instructions       General Comments      Pertinent Vitals/ Pain       Pain Assessment: No/denies pain  Home Living                                          Prior Functioning/Environment              Frequency  Min 2X/week        Progress Toward Goals  OT Goals(current goals can now be found in the care plan section)  Progress towards OT goals: Progressing toward goals     Plan Discharge plan remains appropriate    Co-evaluation  AM-PAC OT "6 Clicks" Daily Activity     Outcome Measure   Help from another person eating meals?: A Little Help from another person taking care of personal grooming?: A Little Help from another person toileting, which includes using toliet, bedpan, or urinal?: A Lot Help from another person bathing (including washing, rinsing, drying)?: A Lot Help from another person to put on and taking off regular upper body clothing?: A Little Help from another person to put on and taking off regular lower body clothing?: A Lot 6 Click Score: 15    End of Session Equipment Utilized During Treatment: Rolling walker  OT Visit Diagnosis: Unsteadiness on feet (R26.81)   Activity Tolerance Patient tolerated treatment well   Patient Left in chair;with call bell/phone within reach;with chair alarm set   Nurse Communication Mobility status        Time: 1441-1452 OT Time Calculation (min): 11 min  Charges: OT General Charges $OT Visit: 1 Visit OT  Treatments $Self Care/Home Management : 8-22 mins  Lucille Passy, OTR/L [place] Pager [phone] Office [phone]    Lucille Passy M 04/25/2019, 2:57 PM

## 2019-04-26 DIAGNOSIS — F0280 Dementia in other diseases classified elsewhere without behavioral disturbance: Secondary | ICD-10-CM

## 2019-04-26 DIAGNOSIS — J439 Emphysema, unspecified: Secondary | ICD-10-CM

## 2019-04-26 DIAGNOSIS — G309 Alzheimer's disease, unspecified: Secondary | ICD-10-CM

## 2019-04-26 LAB — NOVEL CORONAVIRUS, NAA (HOSP ORDER, SEND-OUT TO REF LAB; TAT 18-24 HRS): SARS-CoV-2, NAA: NOT DETECTED

## 2019-04-26 MED ORDER — ENSURE ENLIVE PO LIQD: 237.0000 mL | Freq: Two times a day (BID) | ORAL | 12 refills | Status: DC

## 2019-04-26 NOTE — Progress Notes (Signed)
Report given to nurse Garvin Fila at [facility].

## 2019-04-26 NOTE — Plan of Care (Signed)
Assessment unchanged. [facility] here to transport pt to [facility]. Packet given to [facility] attendant. Dc'd via stretcher accompanied by [place] paramedics x 3.

## 2019-04-26 NOTE — Discharge Summary (Signed)
Physician Discharge Summary  [name] FXT:[id number] DOB: [birth year] DOA: 04/18/2019  PCP: [name], PA  Admit date: 04/18/2019 Discharge date: 04/26/2019  Admitted From: SNF Disposition: SNF   Recommendations for Outpatient Follow-up:  1. Follow up with PCP in 1-2 weeks 2. Please obtain BMP/CBC in one week   Discharge Condition:stable.  CODE STATUS: FULL CODE.  Diet recommendation: Heart Healthy / soft diet for a few days / high fiber diet.   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 She was found to have SBO. Surgery consulted and NG tube placed.   Discharge Diagnoses:  Principal Problem:   SBO (small bowel obstruction) (HCC) Active Problems:   Colostomy in place [facility])   ICD in place   COPD (chronic obstructive pulmonary disease) (HCC)   Dementia (HCC)   Dyslipidemia   CKD (chronic kidney disease) stage 3, GFR 30-59 ml/min (HCC)   Anemia   Benign essential HTN   Essential hypertension   Recurrent parastomal hernia at LLQ colostomy   Chronic combined systolic and diastolic CHF (congestive heart failure) ([place])  SBO; Resolved.  stool in the colostomy.   Advanced to soft diet and she was able to tolerate without any issues. Surgery recommends high fiber diet and bowel regimen, in addition to soft diet for a few days before transitioning to regular diet.    H/o rectal cancer and colostomy in place.      Hypothyroidism:  Resume home meds.    Acute on Stage 3 CKD:  Creatinine improved.     Anemia of chronic disease:  Transfuse to keep hemoglobin greater than 7.    Hypertension;  Well controlled.    Hypokalemia Replaced.    Mild  hypernatremia:  resolved From free water deficit.  Encourage free water intake.    COPD:  No wheezing heard.    Dementia: stable.   Discharge Instructions  Discharge Instructions    Diet - low sodium heart healthy   Complete by: As directed    Discharge instructions   Complete by: As directed    Please follow up with PCP IN one week.     Allergies as of 04/26/2019   No Known Allergies     Medication List    STOP taking these medications   albuterol (2.5 MG/3ML) 0.083% nebulizer solution Commonly known as: PROVENTIL   HM Lidocaine Patch 4 % Ptch Generic drug: Lidocaine   hydrALAZINE 25 MG tablet Commonly known as: APRESOLINE   loperamide 2 MG tablet Commonly known as: IMODIUM A-D   traMADol 50 MG tablet Commonly known as: ULTRAM     TAKE these medications   acetaminophen 500 MG tablet Commonly known as: TYLENOL Take 1,000 mg by mouth 3 (three) times daily.   allopurinol 100 MG tablet Commonly known as: ZYLOPRIM Take 100 mg by mouth daily.   aspirin EC 81 MG tablet Take 1 tablet (81 mg total) by mouth every morning.   atorvastatin 20 MG tablet Commonly known as: LIPITOR Take 20 mg by mouth at bedtime.   Calcium Carbonate-Vitamin D 600-400 MG-UNIT tablet Take 1 tablet by mouth 2 (two) times a day.   carvedilol 6.25 MG tablet Commonly known as: COREG Take 6.25 mg by mouth 2 (two) times daily with a meal.   cyclobenzaprine 5 MG  tablet Commonly known as: FLEXERIL Take 5 mg by mouth at bedtime.   famotidine 40 MG tablet Commonly known as: PEPCID Take 40 mg by mouth daily.   feeding supplement (ENSURE ENLIVE) Liqd Take 237 mLs by mouth 2 (two) times daily between meals.   Fluticasone-Salmeterol 500-50 MCG/DOSE Aepb Commonly known as: ADVAIR Inhale 1 puff into the lungs 2 (two) times daily. Rinse mouth after use   furosemide 40 MG tablet Commonly known as: LASIX Take 1 tablet (40 mg total) by mouth daily. What changed: Another medication  with the same name was removed. Continue taking this medication, and follow the directions you see here.   iron polysaccharides 150 MG capsule Commonly known as: NIFEREX Take 150 mg by mouth 2 (two) times daily.   isosorbide mononitrate 30 MG 24 hr tablet Commonly known as: IMDUR Take 1 tablet (30 mg total) by mouth daily.   levothyroxine 50 MCG tablet Commonly known as: SYNTHROID Take 50 mcg by mouth daily.   loratadine 10 MG tablet Commonly known as: CLARITIN Take 10 mg by mouth daily.   magnesium hydroxide 400 MG/5ML suspension Commonly known as: MILK OF MAGNESIA Take 30 mLs by mouth at bedtime as needed for mild constipation.   meclizine 25 MG tablet Commonly known as: ANTIVERT Take 1 tablet (25 mg total) by mouth 3 (three) times daily as needed for dizziness.   montelukast 10 MG tablet Commonly known as: SINGULAIR Take 10 mg by mouth daily.   ondansetron 4 MG tablet Commonly known as: ZOFRAN Take 1 tablet (4 mg total) by mouth every 6 (six) hours as needed for nausea or vomiting.   polyethylene glycol 17 g packet Commonly known as: MIRALAX / GLYCOLAX Take 17 g by mouth every other day. Mix with 8 ounces of fluid and drink   potassium chloride SA 20 MEQ tablet Commonly known as: K-DUR Take 20 mEq by mouth daily.   REFRESH 1 % ophthalmic solution Generic drug: carboxymethylcellulose Place 1 drop into both eyes 3 (three) times daily.   sodium chloride 0.65 % Soln nasal spray Commonly known as: OCEAN Place 1 spray into both nostrils as needed for congestion.      Follow-up Information    [name], [place]. Schedule an appointment as soon as possible for a visit in 1 week(s).   Specialty: Internal Medicine Contact information: [address] [place] [address] [phone]          No Known Allergies  Consultations:  Surgery     Procedures/Studies: Ct Abdomen Pelvis Wo Contrast  Result Date: 04/18/2019 CLINICAL DATA:  Abdominal  pain and distention, nausea vomiting for 2 days. No fevers, no chills. Extensive surgical history. EXAM: CT ABDOMEN AND PELVIS WITHOUT CONTRAST TECHNIQUE: Multidetector CT imaging of the abdomen and pelvis was performed following the standard protocol without IV contrast. COMPARISON:  CT abdomen pelvis 08/11/2018, 06/23/2018 FINDINGS: Lower chest: AICD leads positioned in the right atrium and cardiac apex. There is cardiomegaly with biatrial enlargement as well as mitral annular calcification and coronary artery calcifications. There are bibasilar areas of subsegmental atelectasis and/or scarring. Hepatobiliary: No visible or contour deforming and [place] lesions. Dependently layering calcified gallstones are present within the otherwise normal gallbladder. No biliary ductal dilatation. No calcified intraductal gallstones. Pancreas: Unremarkable. No pancreatic ductal dilatation or surrounding inflammatory changes. Spleen: Normal in size without focal abnormality. Adrenals/Urinary Tract: Adrenal glands are unremarkable. There is new mild right hydroureteronephrosis without visualized calcification along the course of the right ureter.  Vascular calcifications are noted in both renal hila. No focal lesion or hydronephrosis. Bladder is unremarkable. Stomach/Bowel: Postsurgical changes from prior proctocolectomy with left lower quadrant end colostomy formation. Small hiatal hernia. Stomach is distended with ingested material. There is extensive air in fluid dilatation of the proximal small bowel. Portion of the distended small bowel enters into a peristomal hernia sac in the left lower quadrant however the exiting bowel also appears distended with a second tapering transition point identified in the right lower quadrant (series 3, image 64) where the distal small bowel beyond this point is largely decompressed. The cecum is displaced into the low midline pelvis. Fecal material is present throughout the remaining portions  of the colon. Vascular/Lymphatic: Aortic atherosclerosis. No enlarged abdominal or pelvic lymph nodes. No portal venous gas or air within the splanchnic draining vessels. Reproductive: Post hysterectomy.  No concerning adnexal lesions. Other: Parastomal hernia detailed above. And anterior abdominal wall laxity likely related to multiple prior surgical procedures. No abdominopelvic ascites. Musculoskeletal: No acute or significant osseous findings. Stable appearance of the L1 superior endplate compression deformity when compared to most recent CT. IMPRESSION: 1. Postsurgical changes from proctocolectomy with end colostomy in the left lower quadrant. 2. Small-bowel obstruction with narrowing at the entry of the previously described parastomal hernia which appears only partially obstructive on today's exam with dilated loops beyond the hernia sac. A more distal transition point seen in the right lower quadrant, as detailed above. No evidence of pneumatosis. No free air or free fluid. 3. New mild right hydroureteronephrosis without visualized calcification along the course of the right ureter, which may reflect a recently passed stone. 4. Cholelithiasis without evidence of acute cholecystitis. 5. Coronary and aortic atherosclerosis. 6. Cardiomegaly with biatrial enlargement. These results were called by telephone at the time of interpretation on 04/18/2019 at 7:53 pm to Dr. Theotis Burrow , who verbally acknowledged these results. Electronically Signed   By: MD Lovena Le   On: 04/18/2019 19:54   Dg Abd 1 View  Result Date: 04/22/2019 CLINICAL DATA:  Small-bowel obstruction EXAM: ABDOMEN - 1 VIEW COMPARISON:  04/21/2019; 04/19/2019; CT abdomen pelvis-04/18/2019 FINDINGS: Interval placement of enteric tube with side port projected over the expected location of the gastroesophageal junction. Slight decrease in persistent gaseous distention of several loops of small bowel with index loop of small bowel within the left  mid hemiabdomen measuring approximately 3.5 cm in diameter. Slight progression of enteric contrast now seen to the level of the distal transverse colon. Nondiagnostic evaluation for pneumoperitoneum secondary to supine positioning and exclusion of the lower thorax. No pneumatosis or portal venous gas. Surgical mesh overlies the left lower abdomen. No definite acute or aggressive osseous abnormalities. IMPRESSION: 1. Enteric tube side port projects over the expected location of the gastroesophageal junction. Advancement at least 8 cm is advised. 2. Suspected slight improvement in small bowel obstruction with decreased gaseous distention of the small bowel and slight progression of enteric contrast, now seen to the level of the distal transverse colon. Electronically Signed   By: Sandi Mariscal M.D.   On: 04/22/2019 07:23   Dg Abd Portable 1v  Result Date: 04/21/2019 CLINICAL DATA:  Evaluate small bowel obstruction. EXAM: PORTABLE ABDOMEN - 1 VIEW COMPARISON:  04/18/2019 CT, 04/19/2019 radiographs FINDINGS: Contrast administered via the NG tube on 04/19/2019 now lies within the colon. There are distended gas-filled loops of small bowel again noted. No other significant change identified. IMPRESSION: Contrast administered via the NG tube on 04/19/2019 now lies  within the colon, with persistent distended small bowel loops, suggesting a partial small bowel obstruction. Electronically Signed   By: Margarette Canada M.D.   On: 04/21/2019 08:34   Dg Abd Portable 1v-small Bowel Obstruction Protocol-initial, 8 Hr Delay  Result Date: 04/19/2019 CLINICAL DATA:  Small bowel obstruction. EXAM: PORTABLE ABDOMEN - 1 VIEW COMPARISON:  04/18/2019 and CT 04/18/2019 FINDINGS: Two images are labeled as 8 hour images. There is a nasogastric tube in the proximal stomach region. There is contrast in the stomach. There continues to be dilated loops of small bowel throughout the abdomen particularly on the left side. Evidence for surgical  mesh in the left lower abdomen. There may be a small amount of oral contrast in the left abdomen but difficult to evaluate due to large amount of stool. Patient has a known left colostomy and parastomal hernia. IMPRESSION: Persistent dilated loops of small bowel. Majority of the contrast is in the stomach. Findings remain compatible with a small bowel obstruction. Electronically Signed   By: Markus Daft M.D.   On: 04/19/2019 09:34   Dg Abd Portable 1v-small Bowel Protocol-position Verification  Result Date: 04/18/2019 CLINICAL DATA:  Check nasogastric catheter placement EXAM: PORTABLE ABDOMEN - 1 VIEW COMPARISON:  None. FINDINGS: Scattered large and small bowel gas is noted. Some retained fecal material is seen. Gastric catheter is noted extending into the stomach. Proximal side port lies at the gastroesophageal junction. This could be advanced a few more cm deeper into the stomach. IMPRESSION: Gastric catheter as described. This could be advanced a few more cm deeper into the stomach. Electronically Signed   By: Inez Catalina M.D.   On: 04/18/2019 22:55       Subjective: No new complaints.   Discharge Exam: Vitals:   04/26/19 0627 04/26/19 0856  BP: (!) 144/60   Pulse: 99   Resp: 18   Temp: 98.6 F (37 C)   SpO2: 100% 100%   Vitals:   04/25/19 2023 04/25/19 2057 04/26/19 0627 04/26/19 0856  BP: 139/63  (!) 144/60   Pulse: 96  99   Resp: 18  18   Temp: 98.2 F (36.8 C)  98.6 F (37 C)   TempSrc: Oral  Oral   SpO2: 100% 98% 100% 100%  Weight:      Height:        General: Pt is alert, awake, not in acute distress Cardiovascular: RRR, S1/S2 +, no rubs, no gallops Respiratory: CTA bilaterally, no wheezing, no rhonchi Abdominal: Soft, NT, ND, bowel sounds +, colostomy in place with stool.  Extremities: no edema, no cyanosis    The results of significant diagnostics from this hospitalization (including imaging, microbiology, ancillary and laboratory) are listed below for  reference.     Microbiology: Recent Results (from the past 240 hour(s))  SARS Coronavirus 2 (CEPHEID - Performed in [place] hospital lab), Hosp Order     Status: None   Collection Time: 04/18/19  4:48 PM   Specimen: Urine, Clean Catch; Nasopharyngeal  Result Value Ref Range Status   SARS Coronavirus 2 NEGATIVE NEGATIVE Final    Comment: (NOTE) If result is NEGATIVE SARS-CoV-2 target nucleic acids are NOT DETECTED. The SARS-CoV-2 RNA is generally detectable in upper and lower  respiratory specimens during the acute phase of infection. The lowest  concentration of SARS-CoV-2 viral copies this assay can detect is 250  copies / mL. A negative result does not preclude SARS-CoV-2 infection  and should not be used as the sole basis  for treatment or other  patient management decisions.  A negative result may occur with  improper specimen collection / handling, submission of specimen other  than nasopharyngeal swab, presence of viral mutation(s) within the  areas targeted by this assay, and inadequate number of viral copies  (<250 copies / mL). A negative result must be combined with clinical  observations, patient history, and epidemiological information. If result is POSITIVE SARS-CoV-2 target nucleic acids are DETECTED. The SARS-CoV-2 RNA is generally detectable in upper and lower  respiratory specimens dur ing the acute phase of infection.  Positive  results are indicative of active infection with SARS-CoV-2.  Clinical  correlation with patient history and other diagnostic information is  necessary to determine patient infection status.  Positive results do  not rule out bacterial infection or co-infection with other viruses. If result is PRESUMPTIVE POSTIVE SARS-CoV-2 nucleic acids MAY BE PRESENT.   A presumptive positive result was obtained on the submitted specimen  and confirmed on repeat testing.  While 2019 novel coronavirus  (SARS-CoV-2) nucleic acids may be present in the  submitted sample  additional confirmatory testing may be necessary for epidemiological  and / or clinical management purposes  to differentiate between  SARS-CoV-2 and other Sarbecovirus currently known to infect humans.  If clinically indicated additional testing with an alternate test  methodology [phone]) is advised. The SARS-CoV-2 RNA is generally  detectable in upper and lower respiratory sp ecimens during the acute  phase of infection. The expected result is Negative. Fact Sheet for Patients:  StrictlyIdeas.no Fact Sheet for Healthcare Providers: BankingDealers.co.za This test is not yet approved or cleared by the [place] FDA and has been authorized for detection and/or diagnosis of SARS-CoV-2 by FDA under an Emergency Use Authorization (EUA).  This EUA will remain in effect (meaning this test can be used) for the duration of the COVID-19 declaration under Section 564(b)(1) of the Act, 21 U.S.C. section 360bbb-3(b)(1), unless the authorization is terminated or revoked sooner. Performed at [facility], [place] [address], [place], [place] [address]   Novel Coronavirus, NAA (hospital order; send-out to ref lab)     Status: None   Collection Time: 04/24/19  3:38 PM   Specimen: Nasopharyngeal Swab; Respiratory  Result Value Ref Range Status   SARS-CoV-2, NAA NOT DETECTED NOT DETECTED Final    Comment: (NOTE) This test was developed and its performance characteristics determined by [employer]. This test has not been FDA cleared or approved. This test has been authorized by FDA under an Emergency Use Authorization (EUA). This test is only authorized for the duration of time the declaration that circumstances exist justifying the authorization of the emergency use of in vitro diagnostic tests for detection of SARS-CoV-2 virus and/or diagnosis of COVID-19 infection under section 564(b)(1) of the Act, 21  U.S.C. 371GGY-6(R)(4), unless the authorization is terminated or revoked sooner. When diagnostic testing is negative, the possibility of a false negative result should be considered in the context of a patient's recent exposures and the presence of clinical signs and symptoms consistent with COVID-19. An individual without symptoms of COVID-19 and who is not shedding SARS-CoV-2 virus would expect to have a negative (not detected) result in this assay. Performed  At: [facility] [place], [place] [id number] Rush Farmer MD KK:[id number]    [place]  Final    Comment: Performed at [place] [address], [place],  [address]     Labs: BNP (last  3 results) No results for input(s): BNP in the last 8760 hours. Basic Metabolic Panel: Recent Labs  Lab 04/21/19 0341 04/22/19 0826 04/23/19 0353 04/24/19 0901 04/25/19 0949  NA 145 148* 148* 143 140  K 3.7 3.1* 3.7 3.3* 4.1  CL 107 108 110 107 105  CO2 24 26 28 26 25   GLUCOSE 77 83 106* 108* 99  BUN 24* 22 19 15 17   CREATININE 1.09* 1.04* 1.00 1.00 1.13*  CALCIUM 9.7 9.4 9.3 9.1 9.0  MG  --  2.1  --   --   --    Liver Function Tests: No results for input(s): AST, ALT, ALKPHOS, BILITOT, PROT, ALBUMIN in the last 168 hours. No results for input(s): LIPASE, AMYLASE in the last 168 hours. No results for input(s): AMMONIA in the last 168 hours. CBC: Recent Labs  Lab 04/21/19 0341 04/22/19 0826 04/23/19 0353  WBC 11.1* 13.2* 10.7*  HGB 10.5* 10.3* 10.3*  HCT 36.2 34.8* 35.4*  MCV 96.5 98.0 97.0  PLT 257 243 229   Cardiac Enzymes: No results for input(s): CKTOTAL, CKMB, CKMBINDEX, TROPONINI in the last 168 hours. BNP: Invalid input(s): POCBNP CBG: No results for input(s): GLUCAP in the last 168 hours. D-Dimer No results for input(s): DDIMER in the last 72 hours. Hgb A1c No results for input(s): HGBA1C in the last 72 hours. Lipid Profile No  results for input(s): CHOL, HDL, LDLCALC, TRIG, CHOLHDL, LDLDIRECT in the last 72 hours. Thyroid function studies No results for input(s): TSH, T4TOTAL, T3FREE, THYROIDAB in the last 72 hours.  Invalid input(s): FREET3 Anemia work up No results for input(s): VITAMINB12, FOLATE, FERRITIN, TIBC, IRON, RETICCTPCT in the last 72 hours. Urinalysis    Component Value Date/Time   COLORURINE YELLOW 04/18/2019 1648   APPEARANCEUR HAZY (A) 04/18/2019 1648   LABSPEC 1.012 04/18/2019 1648   PHURINE 5.0 04/18/2019 1648   GLUCOSEU NEGATIVE 04/18/2019 1648   HGBUR NEGATIVE 04/18/2019 1648   BILIRUBINUR NEGATIVE 04/18/2019 1648   KETONESUR NEGATIVE 04/18/2019 1648   PROTEINUR NEGATIVE 04/18/2019 1648   UROBILINOGEN 0.2 07/23/2015 1610   NITRITE NEGATIVE 04/18/2019 1648   LEUKOCYTESUR TRACE (A) 04/18/2019 1648   Sepsis Labs Invalid input(s): PROCALCITONIN,  WBC,  LACTICIDVEN Microbiology Recent Results (from the past 240 hour(s))  SARS Coronavirus 2 (CEPHEID - Performed in [place] hospital lab), Hosp Order     Status: None   Collection Time: 04/18/19  4:48 PM   Specimen: Urine, Clean Catch; Nasopharyngeal  Result Value Ref Range Status   SARS Coronavirus 2 NEGATIVE NEGATIVE Final    Comment: (NOTE) If result is NEGATIVE SARS-CoV-2 target nucleic acids are NOT DETECTED. The SARS-CoV-2 RNA is generally detectable in upper and lower  respiratory specimens during the acute phase of infection. The lowest  concentration of SARS-CoV-2 viral copies this assay can detect is 250  copies / mL. A negative result does not preclude SARS-CoV-2 infection  and should not be used as the sole basis for treatment or other  patient management decisions.  A negative result may occur with  improper specimen collection / handling, submission of specimen other  than nasopharyngeal swab, presence of viral mutation(s) within the  areas targeted by this assay, and inadequate number of viral copies  (<250 copies /  mL). A negative result must be combined with clinical  observations, patient history, and epidemiological information. If result is POSITIVE SARS-CoV-2 target nucleic acids are DETECTED. The SARS-CoV-2 RNA is generally detectable in upper and lower  respiratory specimens dur  ing the acute phase of infection.  Positive  results are indicative of active infection with SARS-CoV-2.  Clinical  correlation with patient history and other diagnostic information is  necessary to determine patient infection status.  Positive results do  not rule out bacterial infection or co-infection with other viruses. If result is PRESUMPTIVE POSTIVE SARS-CoV-2 nucleic acids MAY BE PRESENT.   A presumptive positive result was obtained on the submitted specimen  and confirmed on repeat testing.  While 2019 novel coronavirus  (SARS-CoV-2) nucleic acids may be present in the submitted sample  additional confirmatory testing may be necessary for epidemiological  and / or clinical management purposes  to differentiate between  SARS-CoV-2 and other Sarbecovirus currently known to infect humans.  If clinically indicated additional testing with an alternate test  methodology [phone]) is advised. The SARS-CoV-2 RNA is generally  detectable in upper and lower respiratory sp ecimens during the acute  phase of infection. The expected result is Negative. Fact Sheet for Patients:  StrictlyIdeas.no Fact Sheet for Healthcare Providers: BankingDealers.co.za This test is not yet approved or cleared by the [place] FDA and has been authorized for detection and/or diagnosis of SARS-CoV-2 by FDA under an Emergency Use Authorization (EUA).  This EUA will remain in effect (meaning this test can be used) for the duration of the COVID-19 declaration under Section 564(b)(1) of the Act, 21 U.S.C. section 360bbb-3(b)(1), unless the authorization is terminated or revoked  sooner. Performed at [facility], [place] [address], [place], [place] [address]   Novel Coronavirus, NAA (hospital order; send-out to ref lab)     Status: None   Collection Time: 04/24/19  3:38 PM   Specimen: Nasopharyngeal Swab; Respiratory  Result Value Ref Range Status   SARS-CoV-2, NAA NOT DETECTED NOT DETECTED Final    Comment: (NOTE) This test was developed and its performance characteristics determined by [employer]. This test has not been FDA cleared or approved. This test has been authorized by FDA under an Emergency Use Authorization (EUA). This test is only authorized for the duration of time the declaration that circumstances exist justifying the authorization of the emergency use of in vitro diagnostic tests for detection of SARS-CoV-2 virus and/or diagnosis of COVID-19 infection under section 564(b)(1) of the Act, 21 U.S.C. 147WGN-5(A)(2), unless the authorization is terminated or revoked sooner. When diagnostic testing is negative, the possibility of a false negative result should be considered in the context of a patient's recent exposures and the presence of clinical signs and symptoms consistent with COVID-19. An individual without symptoms of COVID-19 and who is not shedding SARS-CoV-2 virus would expect to have a negative (not detected) result in this assay. Performed  At: [facility] [place], [place] [id number] Rush Farmer MD ON:[id number]    [place]  Final    Comment: Performed at [facility] [address], [place], [place] [address]     Time coordinating discharge: 31 minutes  SIGNED:   Hosie Poisson, MD  [facility] 04/26/2019, 9:49 AM Pager   If 7PM-7AM, please contact night-coverage [email or web address] Password [email or web address]

## 2019-04-26 NOTE — TOC Transition Note (Addendum)
Transition of Care [facility]) - CM/SW Discharge Note   Patient Details  Name: [name] MRN: [id number] Date of Birth: Sep 06, 1934  Transition of [facility]) CM/SW Contact:  Lia Hopping, [place] Phone Number: 04/26/2019, 10:40 AM   Clinical Narrative:    CSW reached out to [facility]. Waiting for return call.  CSW inform patient son of discharge.  [facility]AR to transport/ [facility]. Form complete.  Nurse given the number to call report [phone]  Final next level of care: Skilled Nursing Facility Barriers to Discharge: No Barriers Identified   Patient Goals and CMS Choice Patient states their goals for this hospitalization and ongoing recovery are:: Get better CMS Medicare.gov Compare Post Acute Care list provided to:: Patient Choice offered to / list presented to : Patient  Discharge Placement   Existing PASRR number confirmed : ([id number])          Patient chooses bed at: [facility] Patient to be transferred to facility by: [place] Name of family member notified: Son Patient and family notified of of transfer: 04/26/19  Discharge Plan and Services In-house Referral: NA Discharge Planning Services: NA Post Acute Care Choice: [place]          DME Arranged: N/A DME Agency: NA       HH Arranged: NA HH Agency: NA        Social Determinants of Health ([place]) Interventions     Readmission Risk Interventions Readmission Risk Prevention Plan 04/24/2019 04/21/2019  Transportation Screening - Complete  HRI or [place] - Complete  Social Work Consult for [place] Planning/Counseling - Complete  Palliative Care Screening Not Applicable -  Some recent data might be hidden

## 2020-02-03 ENCOUNTER — Inpatient Hospital Stay (HOSPITAL_COMMUNITY)
Admission: EM | Admit: 2020-02-03 | Discharge: 2020-02-09 | DRG: 291 | Disposition: A | Discharge status: Home Health | Payer: Medicare HMO | Source: Skilled Nursing Facility | Attending: Internal Medicine | Admitting: Internal Medicine

## 2020-02-03 ENCOUNTER — Observation Stay (HOSPITAL_BASED_OUTPATIENT_CLINIC_OR_DEPARTMENT_OTHER): Payer: Medicare HMO

## 2020-02-03 ENCOUNTER — Encounter (HOSPITAL_COMMUNITY): Payer: Self-pay | Admitting: Student

## 2020-02-03 ENCOUNTER — Emergency Department (HOSPITAL_COMMUNITY): Payer: Medicare HMO

## 2020-02-03 ENCOUNTER — Other Ambulatory Visit: Payer: Self-pay

## 2020-02-03 DIAGNOSIS — M109 Gout, unspecified: Secondary | ICD-10-CM | POA: Diagnosis present

## 2020-02-03 DIAGNOSIS — R0602 Shortness of breath: Secondary | ICD-10-CM | POA: Diagnosis not present

## 2020-02-03 DIAGNOSIS — I10 Essential (primary) hypertension: Secondary | ICD-10-CM | POA: Diagnosis present

## 2020-02-03 DIAGNOSIS — Z7951 Long term (current) use of inhaled steroids: Secondary | ICD-10-CM

## 2020-02-03 DIAGNOSIS — I428 Other cardiomyopathies: Secondary | ICD-10-CM | POA: Diagnosis present

## 2020-02-03 DIAGNOSIS — I5033 Acute on chronic diastolic (congestive) heart failure: Secondary | ICD-10-CM | POA: Diagnosis present

## 2020-02-03 DIAGNOSIS — Z933 Colostomy status: Secondary | ICD-10-CM

## 2020-02-03 DIAGNOSIS — Z9841 Cataract extraction status, right eye: Secondary | ICD-10-CM

## 2020-02-03 DIAGNOSIS — E039 Hypothyroidism, unspecified: Secondary | ICD-10-CM | POA: Diagnosis present

## 2020-02-03 DIAGNOSIS — I447 Left bundle-branch block, unspecified: Secondary | ICD-10-CM | POA: Diagnosis present

## 2020-02-03 DIAGNOSIS — Z95810 Presence of automatic (implantable) cardiac defibrillator: Secondary | ICD-10-CM | POA: Diagnosis present

## 2020-02-03 DIAGNOSIS — I5043 Acute on chronic combined systolic (congestive) and diastolic (congestive) heart failure: Secondary | ICD-10-CM | POA: Diagnosis present

## 2020-02-03 DIAGNOSIS — N1831 Chronic kidney disease, stage 3a: Secondary | ICD-10-CM | POA: Diagnosis not present

## 2020-02-03 DIAGNOSIS — Z79890 Hormone replacement therapy (postmenopausal): Secondary | ICD-10-CM

## 2020-02-03 DIAGNOSIS — Z8249 Family history of ischemic heart disease and other diseases of the circulatory system: Secondary | ICD-10-CM

## 2020-02-03 DIAGNOSIS — I130 Hypertensive heart and chronic kidney disease with heart failure and stage 1 through stage 4 chronic kidney disease, or unspecified chronic kidney disease: Secondary | ICD-10-CM | POA: Diagnosis not present

## 2020-02-03 DIAGNOSIS — Z9842 Cataract extraction status, left eye: Secondary | ICD-10-CM

## 2020-02-03 DIAGNOSIS — Z7982 Long term (current) use of aspirin: Secondary | ICD-10-CM

## 2020-02-03 DIAGNOSIS — Z9221 Personal history of antineoplastic chemotherapy: Secondary | ICD-10-CM

## 2020-02-03 DIAGNOSIS — F0391 Unspecified dementia with behavioral disturbance: Secondary | ICD-10-CM | POA: Diagnosis not present

## 2020-02-03 DIAGNOSIS — E785 Hyperlipidemia, unspecified: Secondary | ICD-10-CM | POA: Diagnosis present

## 2020-02-03 DIAGNOSIS — N183 Chronic kidney disease, stage 3 unspecified: Secondary | ICD-10-CM | POA: Diagnosis present

## 2020-02-03 DIAGNOSIS — F0390 Unspecified dementia without behavioral disturbance: Secondary | ICD-10-CM | POA: Diagnosis present

## 2020-02-03 DIAGNOSIS — Z20822 Contact with and (suspected) exposure to covid-19: Secondary | ICD-10-CM | POA: Diagnosis present

## 2020-02-03 DIAGNOSIS — I5041 Acute combined systolic (congestive) and diastolic (congestive) heart failure: Secondary | ICD-10-CM | POA: Diagnosis not present

## 2020-02-03 DIAGNOSIS — Z90710 Acquired absence of both cervix and uterus: Secondary | ICD-10-CM

## 2020-02-03 DIAGNOSIS — Z9049 Acquired absence of other specified parts of digestive tract: Secondary | ICD-10-CM

## 2020-02-03 DIAGNOSIS — N179 Acute kidney failure, unspecified: Secondary | ICD-10-CM | POA: Diagnosis not present

## 2020-02-03 DIAGNOSIS — Z823 Family history of stroke: Secondary | ICD-10-CM

## 2020-02-03 DIAGNOSIS — D631 Anemia in chronic kidney disease: Secondary | ICD-10-CM | POA: Diagnosis present

## 2020-02-03 DIAGNOSIS — Z85048 Personal history of other malignant neoplasm of rectum, rectosigmoid junction, and anus: Secondary | ICD-10-CM

## 2020-02-03 DIAGNOSIS — R55 Syncope and collapse: Secondary | ICD-10-CM | POA: Diagnosis not present

## 2020-02-03 DIAGNOSIS — I272 Pulmonary hypertension, unspecified: Secondary | ICD-10-CM | POA: Diagnosis present

## 2020-02-03 DIAGNOSIS — Z86711 Personal history of pulmonary embolism: Secondary | ICD-10-CM

## 2020-02-03 DIAGNOSIS — J449 Chronic obstructive pulmonary disease, unspecified: Secondary | ICD-10-CM | POA: Diagnosis present

## 2020-02-03 DIAGNOSIS — Z923 Personal history of irradiation: Secondary | ICD-10-CM

## 2020-02-03 DIAGNOSIS — Z961 Presence of intraocular lens: Secondary | ICD-10-CM | POA: Diagnosis present

## 2020-02-03 DIAGNOSIS — D509 Iron deficiency anemia, unspecified: Secondary | ICD-10-CM | POA: Diagnosis present

## 2020-02-03 DIAGNOSIS — I5031 Acute diastolic (congestive) heart failure: Secondary | ICD-10-CM

## 2020-02-03 DIAGNOSIS — G9349 Other encephalopathy: Secondary | ICD-10-CM | POA: Diagnosis not present

## 2020-02-03 DIAGNOSIS — G9340 Encephalopathy, unspecified: Secondary | ICD-10-CM | POA: Diagnosis present

## 2020-02-03 DIAGNOSIS — I080 Rheumatic disorders of both mitral and aortic valves: Secondary | ICD-10-CM | POA: Diagnosis present

## 2020-02-03 DIAGNOSIS — Z8719 Personal history of other diseases of the digestive system: Secondary | ICD-10-CM

## 2020-02-03 DIAGNOSIS — E860 Dehydration: Secondary | ICD-10-CM | POA: Diagnosis not present

## 2020-02-03 DIAGNOSIS — I509 Heart failure, unspecified: Secondary | ICD-10-CM

## 2020-02-03 DIAGNOSIS — Z79899 Other long term (current) drug therapy: Secondary | ICD-10-CM

## 2020-02-03 LAB — COMPREHENSIVE METABOLIC PANEL
ALT: 44 U/L (ref 0–44)
AST: 30 U/L (ref 15–41)
Albumin: 3.9 g/dL (ref 3.5–5.0)
Alkaline Phosphatase: 91 U/L (ref 38–126)
Anion gap: 8 (ref 5–15)
BUN: 17 mg/dL (ref 8–23)
CO2: 30 mmol/L (ref 22–32)
Calcium: 10.1 mg/dL (ref 8.9–10.3)
Chloride: 103 mmol/L (ref 98–111)
Creatinine, Ser: 1.07 mg/dL — ABNORMAL HIGH (ref 0.44–1.00)
GFR calc Af Amer: 56 mL/min — ABNORMAL LOW (ref >60)
GFR calc non Af Amer: 48 mL/min — ABNORMAL LOW (ref >60)
Glucose, Bld: 91 mg/dL (ref 70–99)
Potassium: 4 mmol/L (ref 3.5–5.1)
Sodium: 141 mmol/L (ref 135–145)
Total Bilirubin: 0.5 mg/dL (ref 0.3–1.2)
Total Protein: 8 g/dL (ref 6.5–8.1)

## 2020-02-03 LAB — CBC WITH DIFFERENTIAL/PLATELET
Abs Immature Granulocytes: 0.02 10*3/uL (ref 0.00–0.07)
Basophils Absolute: 0 10*3/uL (ref 0.0–0.1)
Basophils Relative: 0 %
Eosinophils Absolute: 0.1 10*3/uL (ref 0.0–0.5)
Eosinophils Relative: 1 %
HCT: 33.8 % — ABNORMAL LOW (ref 36.0–46.0)
Hemoglobin: 10.4 g/dL — ABNORMAL LOW (ref 12.0–15.0)
Immature Granulocytes: 0 %
Lymphocytes Relative: 24 %
Lymphs Abs: 1.8 10*3/uL (ref 0.7–4.0)
MCH: 30.6 pg (ref 26.0–34.0)
MCHC: 30.8 g/dL (ref 30.0–36.0)
MCV: 99.4 fL (ref 80.0–100.0)
Monocytes Absolute: 0.8 10*3/uL (ref 0.1–1.0)
Monocytes Relative: 10 %
Neutro Abs: 4.8 10*3/uL (ref 1.7–7.7)
Neutrophils Relative %: 65 %
Platelets: 192 10*3/uL (ref 150–400)
RBC: 3.4 MIL/uL — ABNORMAL LOW (ref 3.87–5.11)
RDW: 13.1 % (ref 11.5–15.5)
WBC: 7.5 10*3/uL (ref 4.0–10.5)
nRBC: 0 % (ref 0.0–0.2)

## 2020-02-03 LAB — ECHOCARDIOGRAM COMPLETE
Height: 66 in
Weight: 3024 oz

## 2020-02-03 LAB — RESPIRATORY PANEL BY RT PCR (FLU A&B, COVID)
Influenza A by PCR: NEGATIVE
Influenza B by PCR: NEGATIVE
SARS Coronavirus 2 by RT PCR: NEGATIVE

## 2020-02-03 LAB — BRAIN NATRIURETIC PEPTIDE: B Natriuretic Peptide: 968.1 pg/mL — ABNORMAL HIGH (ref 0.0–100.0)

## 2020-02-03 LAB — POC SARS CORONAVIRUS 2 AG -  ED: SARS Coronavirus 2 Ag: NEGATIVE

## 2020-02-03 MED ORDER — POTASSIUM CHLORIDE CRYS ER 20 MEQ PO TBCR
20.0000 meq | EXTENDED_RELEASE_TABLET | Freq: Once | ORAL | Status: AC
  Administered 2020-02-03: 09:00:00 20 meq via ORAL
  Filled 2020-02-03: qty 1

## 2020-02-03 MED ORDER — ENOXAPARIN SODIUM 40 MG/0.4ML ~~LOC~~ SOLN
40.0000 mg | SUBCUTANEOUS | Status: DC
  Administered 2020-02-03 – 2020-02-04 (×2): 40 mg via SUBCUTANEOUS
  Filled 2020-02-03 (×2): qty 0.4

## 2020-02-03 MED ORDER — PERFLUTREN LIPID MICROSPHERE
1.0000 mL | INTRAVENOUS | Status: AC | PRN
  Administered 2020-02-03: 2 mL via INTRAVENOUS
  Filled 2020-02-03: qty 10

## 2020-02-03 MED ORDER — ATORVASTATIN CALCIUM 20 MG PO TABS
20.0000 mg | ORAL_TABLET | Freq: Every day | ORAL | Status: DC
  Administered 2020-02-03 – 2020-02-08 (×6): 20 mg via ORAL
  Filled 2020-02-03 (×6): qty 1

## 2020-02-03 MED ORDER — MAGNESIUM HYDROXIDE 400 MG/5ML PO SUSP: 30.0000 mL | Freq: Every evening | ORAL | Status: DC | PRN

## 2020-02-03 MED ORDER — ACETAMINOPHEN 325 MG PO TABS
650.0000 mg | ORAL_TABLET | Freq: Four times a day (QID) | ORAL | Status: DC | PRN
  Administered 2020-02-04 – 2020-02-05 (×2): 650 mg via ORAL
  Filled 2020-02-03 (×2): qty 2

## 2020-02-03 MED ORDER — GUAIFENESIN 100 MG/5ML PO SOLN
200.0000 mg | Freq: Four times a day (QID) | ORAL | Status: DC | PRN
  Administered 2020-02-03 – 2020-02-09 (×6): 200 mg via ORAL
  Filled 2020-02-03 (×7): qty 10

## 2020-02-03 MED ORDER — POLYVINYL ALCOHOL 1.4 % OP SOLN
1.0000 [drp] | Freq: Three times a day (TID) | OPHTHALMIC | Status: DC
  Administered 2020-02-03 – 2020-02-09 (×17): 1 [drp] via OPHTHALMIC
  Filled 2020-02-03 (×2): qty 15

## 2020-02-03 MED ORDER — POLYETHYLENE GLYCOL 3350 17 G PO PACK
17.0000 g | PACK | Freq: Every day | ORAL | Status: DC
  Administered 2020-02-04 – 2020-02-05 (×2): 17 g via ORAL
  Filled 2020-02-03 (×5): qty 1

## 2020-02-03 MED ORDER — AEROCHAMBER PLUS FLO-VU MEDIUM MISC
1.0000 | Freq: Once | Status: AC
  Administered 2020-02-03: 1
  Filled 2020-02-03: qty 1

## 2020-02-03 MED ORDER — ONDANSETRON HCL 4 MG PO TABS: 4.0000 mg | ORAL_TABLET | Freq: Four times a day (QID) | ORAL | Status: DC | PRN

## 2020-02-03 MED ORDER — FUROSEMIDE 10 MG/ML IJ SOLN
40.0000 mg | Freq: Two times a day (BID) | INTRAMUSCULAR | Status: DC
  Administered 2020-02-03 – 2020-02-04 (×2): 40 mg via INTRAVENOUS
  Filled 2020-02-03 (×2): qty 4

## 2020-02-03 MED ORDER — ALUM & MAG HYDROXIDE-SIMETH 200-200-20 MG/5ML PO SUSP: 30.0000 mL | ORAL | Status: DC | PRN

## 2020-02-03 MED ORDER — SALINE SPRAY 0.65 % NA SOLN
1.0000 | NASAL | Status: DC | PRN
  Filled 2020-02-03: qty 44

## 2020-02-03 MED ORDER — ONDANSETRON HCL 4 MG/2ML IJ SOLN: 4.0000 mg | Freq: Four times a day (QID) | INTRAMUSCULAR | Status: DC | PRN

## 2020-02-03 MED ORDER — HYDRALAZINE HCL 25 MG PO TABS
25.0000 mg | ORAL_TABLET | Freq: Three times a day (TID) | ORAL | Status: DC | PRN
  Filled 2020-02-03: qty 1

## 2020-02-03 MED ORDER — CARBOXYMETHYLCELLULOSE SODIUM 1 % OP SOLN: 1.0000 [drp] | Freq: Three times a day (TID) | OPHTHALMIC | Status: DC

## 2020-02-03 MED ORDER — FUROSEMIDE 10 MG/ML IJ SOLN
40.0000 mg | Freq: Once | INTRAMUSCULAR | Status: AC
  Administered 2020-02-03: 40 mg via INTRAVENOUS
  Filled 2020-02-03: qty 4

## 2020-02-03 MED ORDER — ISOSORBIDE MONONITRATE ER 60 MG PO TB24
60.0000 mg | ORAL_TABLET | Freq: Every day | ORAL | Status: DC
  Administered 2020-02-04 – 2020-02-09 (×6): 60 mg via ORAL
  Filled 2020-02-03 (×6): qty 1

## 2020-02-03 MED ORDER — ALBUTEROL SULFATE HFA 108 (90 BASE) MCG/ACT IN AERS
6.0000 | INHALATION_SPRAY | Freq: Once | RESPIRATORY_TRACT | Status: AC
  Administered 2020-02-03: 09:00:00 6 via RESPIRATORY_TRACT
  Filled 2020-02-03: qty 6.7

## 2020-02-03 MED ORDER — HYDRALAZINE HCL 25 MG PO TABS
25.0000 mg | ORAL_TABLET | Freq: Three times a day (TID) | ORAL | Status: DC
  Administered 2020-02-03 – 2020-02-09 (×18): 25 mg via ORAL
  Filled 2020-02-03 (×17): qty 1

## 2020-02-03 MED ORDER — ACETAMINOPHEN 650 MG RE SUPP: 650.0000 mg | Freq: Four times a day (QID) | RECTAL | Status: DC | PRN

## 2020-02-03 MED ORDER — LEVOTHYROXINE SODIUM 50 MCG PO TABS
50.0000 ug | ORAL_TABLET | Freq: Every day | ORAL | Status: DC
  Administered 2020-02-03 – 2020-02-09 (×7): 50 ug via ORAL
  Filled 2020-02-03 (×7): qty 1

## 2020-02-03 MED ORDER — FAMOTIDINE 20 MG PO TABS
40.0000 mg | ORAL_TABLET | Freq: Every day | ORAL | Status: DC
  Administered 2020-02-03 – 2020-02-04 (×2): 40 mg via ORAL
  Filled 2020-02-03 (×2): qty 2

## 2020-02-03 MED ORDER — ISOSORBIDE MONONITRATE ER 30 MG PO TB24
30.0000 mg | ORAL_TABLET | Freq: Every day | ORAL | Status: DC
  Administered 2020-02-03: 30 mg via ORAL
  Filled 2020-02-03: qty 1

## 2020-02-03 MED ORDER — CARVEDILOL 6.25 MG PO TABS
6.2500 mg | ORAL_TABLET | Freq: Two times a day (BID) | ORAL | Status: DC
  Administered 2020-02-03 – 2020-02-09 (×13): 6.25 mg via ORAL
  Filled 2020-02-03: qty 1
  Filled 2020-02-03 (×2): qty 2
  Filled 2020-02-03 (×10): qty 1
  Filled 2020-02-03: qty 2

## 2020-02-03 MED ORDER — ASPIRIN EC 81 MG PO TBEC
81.0000 mg | DELAYED_RELEASE_TABLET | Freq: Every day | ORAL | Status: DC
  Administered 2020-02-03 – 2020-02-09 (×7): 81 mg via ORAL
  Filled 2020-02-03 (×6): qty 1

## 2020-02-03 MED ORDER — LORATADINE 10 MG PO TABS
10.0000 mg | ORAL_TABLET | Freq: Every day | ORAL | Status: DC
  Administered 2020-02-04 – 2020-02-09 (×6): 10 mg via ORAL
  Filled 2020-02-03 (×7): qty 1

## 2020-02-03 NOTE — H&P (Addendum)
History and Physical    [name] [id number] DOB: [birth year] DOA: 02/03/2020  PCP: [name], PA   Patient coming from: [place] have personally briefly reviewed patient's old medical records in [place]  Chief Complaint: Shortness of breath  HPI: [name] is a 86 y.o. female with medical history significant of chronic combined systolic and diastolic heart failure with last EF of 40 to 45% in 2018, history of ICD placement, hypertension, hyperlipidemia, hypothyroidism, dementia, rectal cancer, colostomy with recurrent parastomal hernia, bowel obstruction, pulmonary embolism currently not on anticoagulation, COPD, chronic kidney disease stage III presented from assisted living facility with worsening shortness of breath for the last 1 to 2 days.  Patient states that she has had nasal congestion, scratchy throat, cough with clear sputum, wheezing and shortness of breath getting worse over the last couple of days.  Now she is short of breath with even minimal exertion.  No chest pain, fevers, chills, abdominal pain, nausea, vomiting, diarrhea, dysuria, loss of consciousness or seizures or palpitations.  She states that her arms and legs are chronically swollen and unsure if they are swollen more.  She has had 2 doses of her Covid vaccine.  ED Course: She was found to be tachypneic and chest x-ray showed possible pulmonary edema 968.1.  She was given IV Lasix. Hospitalist service was called to evaluate the patient.  Review of Systems: As per HPI otherwise all other systems were reviewed and are negative.   Past Medical History:  Diagnosis Date  . Anginal pain ([place])   . Arthritis    "comes and goes" (03/01/2015)  . Asthma   . CHF (congestive heart failure) ([place])   . Colon cancer ([place])   . Colostomy care ([place])   . COPD (chronic obstructive pulmonary disease) ([place])   . Diabetes mellitus without complication ([place])    pt denies this hx on 03/01/2015  .  HCAP (healthcare-associated pneumonia) 04/22/2017  . Heart murmur   . History of blood transfusion 03/01/2015; 04/23/2017   "related to anemia,"  . Hyperlipidemia   . Hypertension   . Hypothyroidism   . Pneumonia    "just once" (03/01/2015)  . Presence of combination internal cardiac defibrillator (ICD) and pacemaker    2005 in [facility]  . Pulmonary embolism ([place])   . Renal disorder     Past Surgical History:  Procedure Laterality Date  . ABDOMINAL HYSTERECTOMY    . ABDOMINOPERINEAL PROCTOCOLECTOMY  2006   Rectal cancer  . ANKLE FRACTURE SURGERY Left   . CARDIAC CATHETERIZATION N/A 03/22/2015   Procedure: Right/Left Heart Cath and Coronary Angiography;  Surgeon: Leonie Man, MD;  Location: [facility];  Service: Cardiovascular;  Laterality: N/A;  . CATARACT EXTRACTION W/ INTRAOCULAR LENS  IMPLANT, BILATERAL Bilateral   . CHOLECYSTECTOMY    . DILATION AND CURETTAGE OF UTERUS    . FRACTURE SURGERY    . PARASTOMAL HERNIA REPAIR  2007   lysis of adhesions  . TUBAL LIGATION       reports that she has never smoked. She has never used smokeless tobacco. She reports that she does not drink alcohol or use drugs.  No Known Allergies  Family History  Problem Relation Age of Onset  . CAD Other   . Hypertension Other   . Stroke Other   . Colon cancer Neg Hx   . GI Bleed Neg Hx     Prior to Admission medications   Medication Sig  Start Date End Date Taking? Authorizing Provider  acetaminophen (TYLENOL) 500 MG tablet Take 1,000 mg by mouth in the morning and at bedtime.    Yes [provider]  alum & mag hydroxide-simeth (MAALOX/MYLANTA) 200-200-20 MG/5ML suspension Take 30 mLs by mouth as needed for indigestion or heartburn (do not exceed 4 doses in 24hrs).   Yes [provider]  aspirin EC 81 MG tablet Take 1 tablet (81 mg total) by mouth every morning. 03/03/15  Yes Rai, Ripudeep K, MD  atorvastatin (LIPITOR) 20 MG tablet Take 20 mg by mouth at bedtime.     Yes [provider]  Calcium Carbonate-Vitamin D 600-400 MG-UNIT tablet Take 1 tablet by mouth 2 (two) times a day.   Yes [provider]  carboxymethylcellulose (REFRESH) 1 % ophthalmic solution Place 1 drop into both eyes 3 (three) times daily.    Yes [provider]  carvedilol (COREG) 6.25 MG tablet Take 6.25 mg by mouth 2 (two) times daily with a meal.    Yes [provider]  cyclobenzaprine (FLEXERIL) 5 MG tablet Take 5 mg by mouth at bedtime.   Yes [provider]  famotidine (PEPCID) 40 MG tablet Take 40 mg by mouth daily.   Yes [provider]  Fluticasone-Salmeterol (ADVAIR) 500-50 MCG/DOSE AEPB Inhale 1 puff into the lungs 2 (two) times daily. Rinse mouth after use   Yes [provider]  furosemide (LASIX) 40 MG tablet Take 1 tablet (40 mg total) by mouth daily. 12/30/15  Yes Shirley Friar, PA-C  guaifenesin (ROBITUSSIN) 100 MG/5ML syrup Take 200 mg by mouth every 6 (six) hours as needed for cough.   Yes [provider]  iron polysaccharides (NIFEREX) 150 MG capsule Take 150 mg by mouth 2 (two) times daily.   Yes [provider]  isosorbide mononitrate (IMDUR) 30 MG 24 hr tablet Take 1 tablet (30 mg total) by mouth daily. 03/24/15  Yes Dhungel, Nishant, MD  levothyroxine (SYNTHROID, LEVOTHROID) 50 MCG tablet Take 50 mcg by mouth daily.  01/16/15  Yes [provider]  loperamide (IMODIUM A-D) 2 MG tablet Take 2 mg by mouth as needed for diarrhea or loose stools (do not exceed 8 doses in 24hrs).   Yes [provider]  loratadine (CLARITIN) 10 MG tablet Take 10 mg by mouth daily.   Yes [provider]  magnesium hydroxide (MILK OF MAGNESIA) 400 MG/5ML suspension Take 30 mLs by mouth at bedtime as needed for mild constipation.   Yes [provider]  neomycin-bacitracin-polymyxin (NEOSPORIN) [phone] ointment Apply 1 application topically as needed (skin cuts).   Yes  [provider]  ondansetron (ZOFRAN) 4 MG tablet Take 1 tablet (4 mg total) by mouth every 6 (six) hours as needed for nausea or vomiting. [id number]  Yes Delora Fuel, MD  polyethylene glycol [facility] / Floria Raveling) packet Take 17 g by mouth daily. Mix with 8 ounces of fluid and drink    Yes [provider]  sodium chloride (OCEAN) 0.65 % SOLN nasal spray Place 1 spray into both nostrils as needed for congestion.   Yes [provider]  feeding supplement, ENSURE ENLIVE, (ENSURE ENLIVE) LIQD Take 237 mLs by mouth 2 (two) times daily between meals. Patient not taking: Reported on 02/03/2020 04/26/19   Hosie Poisson, MD  meclizine (ANTIVERT) 25 MG tablet Take 1 tablet (25 mg total) by mouth 3 (three) times daily as needed for dizziness. Patient not taking: Reported on 0000000 [id number]   Delora Fuel,  MD    Physical Exam: Vitals:   02/03/20 0800 02/03/20 0815 02/03/20 0830 02/03/20 0900  BP: (!) 173/84 (!) 182/82 101/71   Pulse: 93 95 99   Resp: (!) 31 (!) 39 (!) 23   Temp:      TempSrc:      SpO2: 98% 96% 97%   Weight:    85.7 kg  Height:    5\' 6"  (1.676 m)    Constitutional: NAD, calm, comfortable.  Elderly female lying in bed. Vitals:   02/03/20 0800 02/03/20 0815 02/03/20 0830 02/03/20 0900  BP: (!) 173/84 (!) 182/82 101/71   Pulse: 93 95 99   Resp: (!) 31 (!) 39 (!) 23   Temp:      TempSrc:      SpO2: 98% 96% 97%   Weight:    85.7 kg  Height:    5\' 6"  (1.676 m)   Eyes: PERRL, lids and conjunctivae normal ENMT: Mucous membranes are moist. Posterior pharynx clear of any exudate or lesions. Neck: normal, supple, no masses, no thyromegaly Respiratory: bilateral decreased breath sounds at bases, no wheezing; bibasilar crackles present.  Tachypneic.  No accessory muscle use.  Cardiovascular: S1 S2 positive, rate controlled.  Bilateral lower and upper extremity edema present.  2+ pedal pulses.  Abdomen: no tenderness, no masses palpated. No hepatosplenomegaly.  Bowel sounds positive.  Colostomy bag in place. Musculoskeletal: no clubbing / cyanosis. No joint deformity upper and lower extremities.  Skin: no rashes, lesions, ulcers. No induration Neurologic: CN 2-12 grossly intact. Moving extremities. No focal neurologic deficits.  Slightly slow to respond Psychiatric: Flat affect.  Alert and oriented x 3.    Labs on Admission: I have personally reviewed following labs and imaging studies  CBC: Recent Labs  Lab 02/03/20 0701  WBC 7.5  NEUTROABS 4.8  HGB 10.4*  HCT 33.8*  MCV 99.4  PLT [id number]   Basic Metabolic Panel: Recent Labs  Lab 02/03/20 0701  NA 141  K 4.0  CL 103  CO2 30  GLUCOSE 91  BUN 17  CREATININE 1.07*  CALCIUM 10.1   GFR: Estimated Creatinine Clearance: 44 mL/min (A) (by C-G formula based on SCr of 1.07 mg/dL (H)). Liver Function Tests: Recent Labs  Lab 02/03/20 0701  AST 30  ALT 44  ALKPHOS 91  BILITOT 0.5  PROT 8.0  ALBUMIN 3.9   No results for input(s): LIPASE, AMYLASE in the last 168 hours. No results for input(s): AMMONIA in the last 168 hours. Coagulation Profile: No results for input(s): INR, PROTIME in the last 168 hours. Cardiac Enzymes: No results for input(s): CKTOTAL, CKMB, CKMBINDEX, TROPONINI in the last 168 hours. BNP (last 3 results) No results for input(s): PROBNP in the last 8760 hours. HbA1C: No results for input(s): HGBA1C in the last 72 hours. CBG: No results for input(s): GLUCAP in the last 168 hours. Lipid Profile: No results for input(s): CHOL, HDL, LDLCALC, TRIG, CHOLHDL, LDLDIRECT in the last 72 hours. Thyroid Function Tests: No results for input(s): TSH, T4TOTAL, FREET4, T3FREE, THYROIDAB in the last 72 hours. Anemia Panel: No results for input(s): VITAMINB12, FOLATE, FERRITIN, TIBC, IRON, RETICCTPCT in the last 72 hours. Urine analysis:    Component Value Date/Time   COLORURINE YELLOW 04/18/2019 1648   APPEARANCEUR HAZY (A) 04/18/2019 1648   LABSPEC 1.012 04/18/2019 1648    PHURINE 5.0 04/18/2019 1648   GLUCOSEU NEGATIVE 04/18/2019 1648   HGBUR NEGATIVE 04/18/2019 1648   BILIRUBINUR NEGATIVE 04/18/2019 1648   KETONESUR NEGATIVE  04/18/2019 1648   PROTEINUR NEGATIVE 04/18/2019 1648   UROBILINOGEN 0.2 07/23/2015 1610   NITRITE NEGATIVE 04/18/2019 1648   LEUKOCYTESUR TRACE (A) 04/18/2019 1648    Radiological Exams on Admission: DG Chest 2 View  Result Date: 02/03/2020 CLINICAL DATA:  Dyspnea, cough EXAM: CHEST - 2 VIEW COMPARISON:  03/15/2018 FINDINGS: Stable positioning of left-sided implanted cardiac device. Unchanged cardiomegaly. Atherosclerotic calcification of the aortic knob. Low lung volumes with mildly prominent interstitial markings. No focal airspace consolidation is seen. No pleural effusion or pneumothorax. IMPRESSION: Cardiomegaly with mildly prominent bilateral interstitial markings which may reflect mild edema or atypical/viral infection. Electronically Signed   By: Davina Poke D.O.   On: 02/03/2020 08:22    Assessment/Plan  Acute on chronic combined systolic and diastolic heart failure with history of ICD placement -Last echo showed EF of 40 to 45% in 2018  -presented with increased shortness of breath, elevated BNP and chest x-ray showing possible congestion -Patient has received IV Lasix in the ED.  Continue 40 mg IV every 12 hours Lasix.  Continue Coreg and Imdur.  2D echo.  Strict input and output.  Daily weights.  Fluid restriction. -Cardiology evaluation -Monitor creatinine  CKD stage IIIa -Creatinine at baseline.  Monitor while on diuretics.  Hypertension -Continue Coreg, Imdur and Lasix.  Monitor blood pressure  Hypothyroidism -Continue Synthroid  Dyslipidemia -Continue statin  Anemia of chronic disease -Hemoglobin stable.  Monitor  History of dementia -Currently alert awake oriented.  Monitor mental status.  Delirium precautions.  Fall precautions  COPD -Currently stable.  Continue inhalers.  Apparently was  wheezing earlier but currently not wheezing.  History of rectal cancer status post colostomy -Continue colostomy care.  ER rapid Covid test negative.  PCR pending.  DVT prophylaxis: Lovenox Code Status: Full Family Communication: Spoke to son/[name] on phone  disposition Plan: Probably back to assisted living facility in 1 to 2 days pending clinical improvement Consults called: Cardiology Admission status: Inpatient/observation  Severity of Illness: The appropriate patient status for this patient is OBSERVATION. Observation status is judged to be reasonable and necessary in order to provide the required intensity of service to ensure the patient's safety. The patient's presenting symptoms, physical exam findings, and initial radiographic and laboratory data in the context of their medical condition is felt to place them at decreased risk for further clinical deterioration. Furthermore, it is anticipated that the patient will be medically stable for discharge from the hospital within 2 midnights of admission. The following factors support the patient status of observation.   " The patient's presenting symptoms include shortness of breath. " The physical exam findings include bibasilar crackles/extremity swelling. " The initial radiographic and laboratory data are elevated BNP/pulmonary congestion on chest x-ray.      Aline August MD [facility]  02/03/2020, 9:04 AM

## 2020-02-03 NOTE — ED Notes (Signed)
[name] [phone] would like admitting MD to give him a call.

## 2020-02-03 NOTE — Progress Notes (Signed)
  Echocardiogram 2D Echocardiogram has been performed.  Angellee Cohill A Dashun Borre 02/03/2020, 1:59 PM

## 2020-02-03 NOTE — ED Provider Notes (Signed)
I saw and evaluated the patient, reviewed the resident's note and I agree with the findings and plan.  EKG:   86 year old female presents with shortness of breath.  On chest x-ray patient has evidence of fluid overload and BNP is elevated consistent with CHF.  Given Lasix and will be admitted to the hospitalist service   Lacretia Leigh, MD 02/03/20 [phone]

## 2020-02-03 NOTE — ED Triage Notes (Signed)
PER EMS: Patient is coming from [facility] facility with c/o SOB, congestion, and cough for the past 2 days. Pt had a breathing treatment before she left her facility and states she no longer feels short of breath. Pt does take lasix, no decrease in urination. Edema in arms and legs are at baseline. Pt is ambulatory and A&Ox4. Denies fever, chills, and chest pain.   EMS VITALS: BP 154/94 HR 88 RR 16 SPO2 98% RA TEMP 97.8

## 2020-02-03 NOTE — ED Notes (Signed)
[employer] at bedside.

## 2020-02-03 NOTE — ED Notes (Signed)
Pt given chicken noodle soup and drink.    Visitor at bedside.

## 2020-02-03 NOTE — ED Notes (Signed)
Spoke with [employer], they are sending a representative to interrogate ICD.

## 2020-02-03 NOTE — ED Provider Notes (Signed)
[facility] DEPT Provider Note   CSN: [id number] Arrival date & time: 02/03/20  0636     History Chief Complaint  Patient presents with  . Shortness of Breath    [name] is a 86 y.o. female with a history of CHF last EF 40-45% in 2018, COPD, DM, hypertension, hyperlipidemia, hypothyroidism, pulmonary embolism not on anticoagulation, dementia, and colon cancer who presents to the ED via EMS from [place] for evaluation of dyspnea that began 1-2 days ago. Patient states that she has had nasal congestion, scratchy throat, cough productive of clear phlegm sputum, wheezing, and dyspnea. Given breathing tx prior to arrival without improvement. No other alleviating/aggravating factors. She states her arms and legs are always swollen, she is unsure if this is worse. Denies fever, chills, chest pain, abdominal pain, N/V/D, hemoptysis, or recent travel/hospitalization/surgery. She has had 2 doses of her COVID vaccine.   HPI     Past Medical History:  Diagnosis Date  . Anginal pain ([place])   . Arthritis    "comes and goes" (03/01/2015)  . Asthma   . CHF (congestive heart failure) ([place])   . Colon cancer ([place])   . Colostomy care ([place])   . COPD (chronic obstructive pulmonary disease) ([place])   . Diabetes mellitus without complication ([place])    pt denies this hx on 03/01/2015  . HCAP (healthcare-associated pneumonia) 04/22/2017  . Heart murmur   . History of blood transfusion 03/01/2015; 04/23/2017   "related to anemia,"  . Hyperlipidemia   . Hypertension   . Hypothyroidism   . Pneumonia    "just once" (03/01/2015)  . Presence of combination internal cardiac defibrillator (ICD) and pacemaker    2005 in [facility]  . Pulmonary embolism ([place])   . Renal disorder     Patient Active Problem List   Diagnosis Date Noted  . Chronic combined systolic and diastolic CHF (congestive heart failure) ([place]) 04/18/2019  . Personal history of gout 08/28/2018  . Recurrent  parastomal hernia at LLQ colostomy 08/21/2018  . SBO (small bowel obstruction) ([place]) 08/21/2018  . Chest pain 10/28/2017  . Aspiration of liquid 10/28/2017  . Bilateral lower extremity edema   . Pneumonia 04/22/2017  . Altered mental status   . Hypotension 02/16/2017  . Syncope 02/02/2016  . Faintness   . Essential hypertension 11/16/2015  . Hypothyroidism 11/16/2015  . Acute on chronic systolic CHF (congestive heart failure) ([place]) 11/15/2015  . Hypertensive heart disease 10/04/2015  . Elevated troponin 10/04/2015  . Acute respiratory failure ([place])   . Acute systolic congestive heart failure ([place])   . NICM (nonischemic cardiomyopathy) ([place]) 08/11/2015  . Normal coronary arteries 08/11/2015  . Dyslipidemia 07/27/2015  . CKD (chronic kidney disease) stage 3, GFR 30-59 ml/min 07/27/2015  . Anemia 07/27/2015  . Benign essential HTN 07/27/2015  . FUO (fever of unknown origin) 07/27/2015  . IDA (iron deficiency anemia) 07/27/2015  . Chronic systolic CHF (congestive heart failure) ([place]) 07/27/2015  . Rectal cancer ypTypN0 (0/13) s/p chemoXRT/ APR/colostomy 2006 07/27/2015  . Sepsis ([place]) 07/24/2015  . Acute encephalopathy 07/24/2015  . Hypokalemia 07/24/2015  . Dementia ([place]) 07/23/2015  . COPD (chronic obstructive pulmonary disease) ([place]) 04/25/2015  . ICD in place 04/14/2015  . Colostomy in place [facility]) 03/01/2015  . Pacemaker 11/07/2012  . Anxiety 10/04/2011    Past Surgical History:  Procedure Laterality Date  . ABDOMINAL HYSTERECTOMY    . ABDOMINOPERINEAL PROCTOCOLECTOMY  2006   Rectal cancer  . ANKLE FRACTURE  SURGERY Left   . CARDIAC CATHETERIZATION N/A 03/22/2015   Procedure: Right/Left Heart Cath and Coronary Angiography;  Surgeon: Leonie Man, MD;  Location: [facility];  Service: Cardiovascular;  Laterality: N/A;  . CATARACT EXTRACTION W/ INTRAOCULAR LENS  IMPLANT, BILATERAL Bilateral   . CHOLECYSTECTOMY    . DILATION AND CURETTAGE OF UTERUS    . FRACTURE  SURGERY    . PARASTOMAL HERNIA REPAIR  2007   lysis of adhesions  . TUBAL LIGATION       OB History   No obstetric history on file.     Family History  Problem Relation Age of Onset  . CAD Other   . Hypertension Other   . Stroke Other   . Colon cancer Neg Hx   . GI Bleed Neg Hx     Social History   Tobacco Use  . Smoking status: Never Smoker  . Smokeless tobacco: Never Used  Substance Use Topics  . Alcohol use: No  . Drug use: No    Home Medications Prior to Admission medications   Medication Sig Start Date End Date Taking? Authorizing Provider  acetaminophen (TYLENOL) 500 MG tablet Take 1,000 mg by mouth 3 (three) times daily.     [provider]  allopurinol (ZYLOPRIM) 100 MG tablet Take 100 mg by mouth daily.    [provider]  aspirin EC 81 MG tablet Take 1 tablet (81 mg total) by mouth every morning. 03/03/15   Rai, Ripudeep K, MD  atorvastatin (LIPITOR) 20 MG tablet Take 20 mg by mouth at bedtime.     [provider]  Calcium Carbonate-Vitamin D 600-400 MG-UNIT tablet Take 1 tablet by mouth 2 (two) times a day.    [provider]  carboxymethylcellulose (REFRESH) 1 % ophthalmic solution Place 1 drop into both eyes 3 (three) times daily.     [provider]  carvedilol (COREG) 6.25 MG tablet Take 6.25 mg by mouth 2 (two) times daily with a meal.     [provider]  cyclobenzaprine (FLEXERIL) 5 MG tablet Take 5 mg by mouth at bedtime.    [provider]  famotidine (PEPCID) 40 MG tablet Take 40 mg by mouth daily.    [provider]  feeding supplement, ENSURE ENLIVE, (ENSURE ENLIVE) LIQD Take 237 mLs by mouth 2 (two) times daily between meals. 04/26/19   Hosie Poisson, MD  Fluticasone-Salmeterol (ADVAIR) 500-50 MCG/DOSE AEPB Inhale 1 puff into the lungs 2 (two) times daily. Rinse mouth after use    [provider]  furosemide (LASIX) 40 MG tablet Take 1 tablet (40 mg total) by mouth daily.  12/30/15   Shirley Friar, PA-C  iron polysaccharides (NIFEREX) 150 MG capsule Take 150 mg by mouth 2 (two) times daily.    [provider]  isosorbide mononitrate (IMDUR) 30 MG 24 hr tablet Take 1 tablet (30 mg total) by mouth daily. 03/24/15   Dhungel, Nishant, MD  levothyroxine (SYNTHROID, LEVOTHROID) 50 MCG tablet Take 50 mcg by mouth daily.  01/16/15   [provider]  loratadine (CLARITIN) 10 MG tablet Take 10 mg by mouth daily.    [provider]  magnesium hydroxide (MILK OF MAGNESIA) 400 MG/5ML suspension Take 30 mLs by mouth at bedtime as needed for mild constipation.    [provider]  meclizine (ANTIVERT) 25 MG tablet Take 1 tablet (25 mg total) by mouth 3 (three) times daily as needed for dizziness. [id number]   Delora Fuel,  MD  montelukast (SINGULAIR) 10 MG tablet Take 10 mg by mouth daily.     [provider]  ondansetron (ZOFRAN) 4 MG tablet Take 1 tablet (4 mg total) by mouth every 6 (six) hours as needed for nausea or vomiting. [id number]   Delora Fuel, MD  polyethylene glycol [facility] / Floria Raveling) packet Take 17 g by mouth every other day. Mix with 8 ounces of fluid and drink    [provider]  potassium chloride SA (K-DUR,KLOR-CON) 20 MEQ tablet Take 20 mEq by mouth daily.     [provider]  sodium chloride (OCEAN) 0.65 % SOLN nasal spray Place 1 spray into both nostrils as needed for congestion.    [provider]    Allergies    Patient has no known allergies.  Review of Systems   Review of Systems  Constitutional: Negative for chills and fever.  HENT: Positive for congestion and sore throat (scratchy). Negative for ear pain.   Respiratory: Positive for cough, shortness of breath and wheezing.   Cardiovascular: Negative for chest pain.  Gastrointestinal: Negative for abdominal pain, diarrhea, nausea and vomiting.  Genitourinary: Negative for dysuria.  Musculoskeletal:       Diffuse leg/arm  swelling, baseline per patient.   Neurological: Negative for dizziness, syncope and light-headedness.  All other systems reviewed and are negative.   Physical Exam Updated Vital Signs BP (!) 157/104 (BP Location: Right Arm)   Pulse 93   Temp 98.7 F (37.1 C) (Oral)   Resp 15   SpO2 94%   Physical Exam Vitals and nursing note reviewed.  Constitutional:      General: She is not in acute distress.    Appearance: She is well-developed.  HENT:     Head: Normocephalic and atraumatic.     Right Ear: Ear canal normal. Tympanic membrane is not perforated, erythematous, retracted or bulging.     Left Ear: Ear canal normal. Tympanic membrane is not perforated, erythematous, retracted or bulging.     Ears:     Comments: No mastoid erythema/swelling/tenderness.     Nose: Congestion present.     Right Sinus: No maxillary sinus tenderness or frontal sinus tenderness.     Left Sinus: No maxillary sinus tenderness or frontal sinus tenderness.     Mouth/Throat:     Pharynx: Uvula midline. No oropharyngeal exudate or posterior oropharyngeal erythema.     Comments: Posterior oropharynx is symmetric appearing. Patient tolerating own secretions without difficulty. No trismus. No drooling. No hot potato voice. No swelling beneath the tongue, submandibular compartment is soft.  Eyes:     General:        Right eye: No discharge.        Left eye: No discharge.     Conjunctiva/sclera: Conjunctivae normal.     Pupils: Pupils are equal, round, and reactive to light.  Cardiovascular:     Rate and Rhythm: Normal rate and regular rhythm.     Heart sounds: No murmur.  Pulmonary:     Effort: Tachypnea present. No respiratory distress.     Breath sounds: Wheezing (bibasilar expiratory) and rales (bibasilar) present. No rhonchi.  Abdominal:     General: There is no distension.     Palpations: Abdomen is soft.     Tenderness: There is no abdominal tenderness.     Comments: Colostomy bag in place to left  abdomen.   Musculoskeletal:     Cervical back: Normal range of motion and neck supple. No edema or  rigidity.     Comments:  1+ pitting edema to bilateral lower legs- symmetric, no overlying erythema/warmth, no calf tenderness to palpation.   Lymphadenopathy:     Cervical: No cervical adenopathy.  Skin:    General: Skin is warm and dry.     Findings: No rash.  Neurological:     Mental Status: She is alert.  Psychiatric:        Behavior: Behavior normal.    ED Results / Procedures / Treatments   Labs (all labs ordered are listed, but only abnormal results are displayed) Labs Reviewed  CBC WITH DIFFERENTIAL/PLATELET - Abnormal; Notable for the following components:      Result Value   RBC 3.40 (*)    Hemoglobin 10.4 (*)    HCT 33.8 (*)    All other components within normal limits  BRAIN NATRIURETIC PEPTIDE - Abnormal; Notable for the following components:   B Natriuretic Peptide 968.1 (*)    All other components within normal limits  COMPREHENSIVE METABOLIC PANEL - Abnormal; Notable for the following components:   Creatinine, Ser 1.07 (*)    GFR calc non Af Amer 48 (*)    GFR calc Af Amer 56 (*)    All other components within normal limits  POC SARS CORONAVIRUS 2 AG -  ED    EKG EKG Interpretation  Date/Time:  Tuesday February 03 2020 07:14:54 EDT Ventricular Rate:  92 PR Interval:    QRS Duration: 174 QT Interval:  407 QTC Calculation: 504 R Axis:   109 Text Interpretation: Sinus rhythm Borderline prolonged PR interval Nonspecific intraventricular conduction delay ST depr, consider ischemia, inferior leads No significant change since last tracing Confirmed by Lacretia Leigh ([id number]) on 02/03/2020 9:12:58 AM   Radiology DG Chest 2 View  Result Date: 02/03/2020 CLINICAL DATA:  Dyspnea, cough EXAM: CHEST - 2 VIEW COMPARISON:  03/15/2018 FINDINGS: Stable positioning of left-sided implanted cardiac device. Unchanged cardiomegaly. Atherosclerotic calcification of the aortic  knob. Low lung volumes with mildly prominent interstitial markings. No focal airspace consolidation is seen. No pleural effusion or pneumothorax. IMPRESSION: Cardiomegaly with mildly prominent bilateral interstitial markings which may reflect mild edema or atypical/viral infection. Electronically Signed   By: Davina Poke D.O.   On: 02/03/2020 08:22    Procedures Procedures (including critical care time)  Medications Ordered in ED Medications  albuterol (VENTOLIN HFA) 108 (90 Base) MCG/ACT inhaler 6 puff (has no administration in time range)  AeroChamber Plus Flo-Vu Medium MISC 1 each (has no administration in time range)    ED Course  I have reviewed the triage vital signs and the nursing notes.  Pertinent labs & imaging results that were available during my care of the patient were reviewed by me and considered in my medical decision making (see chart for details).    LYN KOMM was evaluated in Emergency Department on 02/03/2020 for the symptoms described in the history of present illness. He/she was evaluated in the context of the global COVID-19 pandemic, which necessitated consideration that the patient might be at risk for infection with the SARS-CoV-2 virus that causes COVID-19. Institutional protocols and algorithms that pertain to the evaluation of patients at risk for COVID-19 are in a state of rapid change based on information released by regulatory bodies including the CDC and federal and state organizations. These policies and algorithms were followed during the patient's care in the ED.  MDM Rules/Calculators/A&P  Patient presents to the ED with complaints of cough, congestion, wheezing, & dyspnea. Nontoxic, elevated BP noted, mild intermittent tachypnea. Rales & expiratory wheeze at the bases.   DDx: CHF exacerbation, COPD exacerbation, pneumonia, viral illness, pneumothorax, arrhythmia, PE, anemia.   Additional history obtained:  Additional history  obtained from EMS- relayed patient received breathing tx PTA. Previous records obtained & reviewed including most recent echocardiogram.   EKG: No significant change since last tracing.   Lab Tests:  I Ordered, reviewed, and interpreted labs, which included:  CBC: No leukocytosis. Mild anemia.  CMP: Mildly elevated creatinine similar to prior. No electrolyte derangement.  BNP: Elevated @ 968.1  Imaging Studies ordered:  I ordered imaging studies which included CXR, I independently visualized and interpreted imaging which showed Cardiomegaly with mildly prominent bilateral interstitial markings which may reflect mild edema or atypical/viral infection  ED Course:  I have ordered albuterol inhaler for expiratory wheeze.   CXR with cardiomegaly, favor edema regarding bilateral interstitial markings given peripheral edema with elevated BNP and negative rapid covid test. PCR ordered. Suspect CHF exacerbation, may have a degree of COPD exacerbation as well with wheezing. Patient is mildly tachypneic, last echo 2018, will start IV diuresis & consult for admission.   This is a shared visit with supervising physician Dr. Zenia Resides who has independently evaluated patient & provided guidance in evaluation/management/disposition, in agreement with care   08:51: CONSULT: Discussed with hospitalist Dr. Starla Link- accepts admission.   Portions of this note were generated with [employer]. Dictation errors may occur despite best attempts at proofreading.   Final Clinical Impression(s) / ED Diagnoses Final diagnoses:  Acute on chronic congestive heart failure, unspecified heart failure type [facility])    Rx / DC Orders ED Discharge Orders    None       Amaryllis Dyke, PA-C 02/03/20 0939    Lacretia Leigh, MD 02/04/20 1546

## 2020-02-03 NOTE — Consult Note (Addendum)
Cardiology Consultation:   Patient ID: DEIANEIRA [name] MRN: [id number]; DOB: Sep 03, 1934  Admit date: 02/03/2020 Date of Consult: 02/03/2020  Primary Care Provider: [name], [place] Primary Cardiologist: No primary care provider on file. Dr. Haroldine Laws  Last seen 2017  Primary Electrophysiologist:  None    Patient Profile:   [name] is a 86 y.o. 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  History of Present Illness:   [name] with above hx and known NICM with ICD and has not been seen by cardiology here since 2017.  She lives at [facility] facility, assisted living.   Last echo 04/23/17 with EF 40-45% severe concentric hypertrophy   Mild to mod AR and moderate MS, PA pk pressure 65 mmHG  (moderate pulmonary HTN)  Echo in 2016 and 2017 EF 35-40%    BP is elevated 157/104    EKG:  The EKG was personally reviewed and demonstrates:  SR with V paced vs LBBB, but appears a pacing spike.  Telemetry:  Telemetry was personally reviewed and demonstrates:  ST with LBBB  NA 141 K+ 4.0 cr 1.07 BNP 968  HGB 10.4 WBC 7.5 plts 192 COVID neg  Currently  has rc'd lasix 40 IV --her SOB has been over last few days more gradual onset.  Inhaler at assisted living did not help.  No chest pain.   She does not remember when her device was last checked.   I have contacted MDT and they will interrogate.  It was placed in 2005 in [place].  Most likely will need follow up through our office.      Past Medical History:  Diagnosis Date  . Anginal pain ([place])   . Arthritis    "comes and goes" (03/01/2015)  . Asthma   . CHF (congestive heart failure) ([place])   . Colon cancer ([place])   . Colostomy care ([place])   . COPD (chronic obstructive pulmonary disease) ([place])    . Diabetes mellitus without complication ([place])    pt denies this hx on 03/01/2015  . HCAP (healthcare-associated pneumonia) 04/22/2017  . Heart murmur   . History of blood transfusion 03/01/2015; 04/23/2017   "related to anemia,"  . Hyperlipidemia   . Hypertension   . Hypothyroidism   . Pneumonia    "just once" (03/01/2015)  . Presence of combination internal cardiac defibrillator (ICD) and pacemaker    2005 in [place] [facility]  . Pulmonary embolism ([place])   . Renal disorder     Past Surgical History:  Procedure Laterality Date  . ABDOMINAL HYSTERECTOMY    . ABDOMINOPERINEAL PROCTOCOLECTOMY  2006   Rectal cancer  . ANKLE FRACTURE SURGERY Left   . CARDIAC CATHETERIZATION N/A 03/22/2015   Procedure: Right/Left Heart Cath and Coronary Angiography;  Surgeon: Leonie Man, MD;  Location: [facility];  Service: Cardiovascular;  Laterality: N/A;  . CATARACT EXTRACTION W/ INTRAOCULAR LENS  IMPLANT, BILATERAL Bilateral   . CHOLECYSTECTOMY    . DILATION AND CURETTAGE OF UTERUS    . FRACTURE SURGERY    . PARASTOMAL HERNIA REPAIR  2007   lysis of adhesions  . TUBAL LIGATION       Home Medications:  Prior to Admission medications   Medication  Sig Start Date End Date Taking? Authorizing Provider  acetaminophen (TYLENOL) 500 MG tablet Take 1,000 mg by mouth in the morning and at bedtime.    Yes [provider]  alum & mag hydroxide-simeth (MAALOX/MYLANTA) 200-200-20 MG/5ML suspension Take 30 mLs by mouth as needed for indigestion or heartburn (do not exceed 4 doses in 24hrs).   Yes [provider]  aspirin EC 81 MG tablet Take 1 tablet (81 mg total) by mouth every morning. 03/03/15  Yes Rai, Ripudeep K, MD  atorvastatin (LIPITOR) 20 MG tablet Take 20 mg by mouth at bedtime.    Yes [provider]  Calcium Carbonate-Vitamin D 600-400 MG-UNIT tablet Take 1 tablet by mouth 2 (two) times a day.   Yes [provider]  carboxymethylcellulose (REFRESH) 1 %  ophthalmic solution Place 1 drop into both eyes 3 (three) times daily.    Yes [provider]  carvedilol (COREG) 6.25 MG tablet Take 6.25 mg by mouth 2 (two) times daily with a meal.    Yes [provider]  cyclobenzaprine (FLEXERIL) 5 MG tablet Take 5 mg by mouth at bedtime.   Yes [provider]  famotidine (PEPCID) 40 MG tablet Take 40 mg by mouth daily.   Yes [provider]  Fluticasone-Salmeterol (ADVAIR) 500-50 MCG/DOSE AEPB Inhale 1 puff into the lungs 2 (two) times daily. Rinse mouth after use   Yes [provider]  furosemide (LASIX) 40 MG tablet Take 1 tablet (40 mg total) by mouth daily. 12/30/15  Yes Shirley Friar, PA-C  guaifenesin (ROBITUSSIN) 100 MG/5ML syrup Take 200 mg by mouth every 6 (six) hours as needed for cough.   Yes [provider]  iron polysaccharides (NIFEREX) 150 MG capsule Take 150 mg by mouth 2 (two) times daily.   Yes [provider]  isosorbide mononitrate (IMDUR) 30 MG 24 hr tablet Take 1 tablet (30 mg total) by mouth daily. 03/24/15  Yes Dhungel, Nishant, MD  levothyroxine (SYNTHROID, LEVOTHROID) 50 MCG tablet Take 50 mcg by mouth daily.  01/16/15  Yes [provider]  loperamide (IMODIUM A-D) 2 MG tablet Take 2 mg by mouth as needed for diarrhea or loose stools (do not exceed 8 doses in 24hrs).   Yes [provider]  loratadine (CLARITIN) 10 MG tablet Take 10 mg by mouth daily.   Yes [provider]  magnesium hydroxide (MILK OF MAGNESIA) 400 MG/5ML suspension Take 30 mLs by mouth at bedtime as needed for mild constipation.   Yes [provider]  neomycin-bacitracin-polymyxin (NEOSPORIN) [phone] ointment Apply 1 application topically as needed (skin cuts).   Yes [provider]  ondansetron (ZOFRAN) 4 MG tablet Take 1 tablet (4 mg total) by mouth every 6 (six) hours as needed for nausea or vomiting. [id number]  Yes Delora Fuel, MD  polyethylene glycol  [facility] / Floria Raveling) packet Take 17 g by mouth daily. Mix with 8 ounces of fluid and drink    Yes [provider]  sodium chloride (OCEAN) 0.65 % SOLN nasal spray Place 1 spray into both nostrils as needed for congestion.   Yes [provider]  feeding supplement, ENSURE ENLIVE, (ENSURE ENLIVE) LIQD Take 237 mLs by mouth 2 (two) times daily between meals. Patient not taking: Reported on 02/03/2020 04/26/19   Hosie Poisson, MD  meclizine (ANTIVERT) 25 MG tablet Take 1 tablet (25 mg total) by mouth 3 (three) times daily as needed for dizziness. Patient not taking: Reported on [id number]   Roxanne Mins,  Shanon Brow, MD    Inpatient Medications: Scheduled Meds: . aspirin EC  81 mg Oral Daily  . atorvastatin  20 mg Oral QHS  . carboxymethylcellulose  1 drop Both Eyes TID  . carvedilol  6.25 mg Oral BID WC  . enoxaparin (LOVENOX) injection  40 mg Subcutaneous Q24H  . famotidine  40 mg Oral Daily  . furosemide  40 mg Intravenous Q12H  . isosorbide mononitrate  30 mg Oral Daily  . levothyroxine  50 mcg Oral QAC breakfast  . [START ON 02/04/2020] loratadine  10 mg Oral Daily  . [START ON 02/04/2020] polyethylene glycol  17 g Oral Daily   Continuous Infusions:  PRN Meds: acetaminophen **OR** acetaminophen, alum & mag hydroxide-simeth, guaifenesin, magnesium hydroxide, ondansetron **OR** ondansetron (ZOFRAN) IV, sodium chloride  Allergies:   No Known Allergies  Social History:   Social History   Socioeconomic History  . Marital status: Divorced    Spouse name: Not on file  . Number of children: Not on file  . Years of education: Not on file  . Highest education level: Not on file  Occupational History  . Occupation: Retired Psychologist, counselling  Tobacco Use  . Smoking status: Never Smoker  . Smokeless tobacco: Never Used  Substance and Sexual Activity  . Alcohol use: No  . Drug use: No  . Sexual activity: Never  Other Topics Concern  . Not on file  Social History Narrative     12/19 was a resident of [facility], was discharged to [facility].   Social Determinants of Health   Financial Resource Strain:   . Difficulty of Paying Living Expenses:   Food Insecurity:   . Worried About Charity fundraiser in the Last Year:   . Arboriculturist in the Last Year:   Transportation Needs:   . Film/video editor (Medical):   Marland Kitchen Lack of Transportation (Non-Medical):   Physical Activity:   . Days of Exercise per Week:   . Minutes of Exercise per Session:   Stress:   . Feeling of Stress :   Social Connections:   . Frequency of Communication with Friends and Family:   . Frequency of Social Gatherings with Friends and Family:   . Attends Religious Services:   . Active Member of Clubs or Organizations:   . Attends Archivist Meetings:   Marland Kitchen Marital Status:   Intimate Partner Violence:   . Fear of Current or Ex-Partner:   . Emotionally Abused:   Marland Kitchen Physically Abused:   . Sexually Abused:     Family History:    Family History  Problem Relation Age of Onset  . CAD Other   . Hypertension Other   . Stroke Other   . Colon cancer Neg Hx   . GI Bleed Neg Hx      ROS:  Please see the history of present illness.  General:no colds or fevers, no weight changes Skin:no rashes or ulcers HEENT:no blurred vision, no congestion CV:see HPI PUL:see HPI GI:no diarrhea constipation or melena, no indigestion GU:no hematuria, no dysuria MS:no joint pain, no claudication Neuro:no syncope, no lightheadedness Endo:no diabetes, + thyroid disease  All other ROS reviewed and negative.     Physical Exam/Data:   Vitals:   02/03/20 0830 02/03/20 0845 02/03/20 0900 02/03/20 0930  BP: 101/71  (!) 145/99 (!) 187/91  Pulse: 99 96    Resp: (!) 23 (!) 36 20 (!) 21  Temp:  TempSrc:      SpO2: 97% 98% 98%   Weight:   85.7 kg   Height:   5\' 6"  (1.676 m)    No intake or output data in the 24 hours ending 02/03/20 1032 Last 3  Weights 02/03/2020 04/18/2019 09/13/2018  Weight (lbs) 189 lb 216 lb 206 lb 9.6 oz  Weight (kg) 85.73 kg 97.977 kg 93.713 kg     Body mass index is 30.51 kg/m.  General:  Well nourished, well developed, in no acute distress HEENT: normal Lymph: no adenopathy Neck: no JVD Endocrine:  No thryomegaly Vascular: No carotid bruits; pedal pulses 2+ bilaterally  Cardiac:  normal S1, S2; RRR; no murmur gallup rub or click Lungs:  Rales in bases to auscultation bilaterally, no wheezing, rhonchi  Abd: soft, nontender, no hepatomegaly  Ext: tr edema Musculoskeletal:  No deformities, BUE and BLE strength normal and equal Skin: warm and dry  Neuro:  Oriented to person, place and month , no focal abnormalities noted Psych:  Normal affect   Relevant CV Studies: Rt and Lt cardiac cath 03/2015 1. 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 Anomalous codominant RCA 3. Moderate to severely reduced EF by LV gram consistent with echocardiogram. Despite this the cardiac output/index is only mild to moderately reduced. (Low normal by Fick and mildly to moderately reduced by Thermodilution) 4. Moderate to severely elevated LVEDP with only mildly elevated pulmonary arterial capillary wedge pressure of 15 mmHg   No angiographic data for culprit lesion to explain the patient's reduced EF and acute exacerbation.  Recommendations:  Standard post femoral arterial and vein catheterization care.  Continue diuresis and heart failure management per primary service.  Evaluate cause of anemia. Significant hemoglobin drop while on IV heparin which suggest a bleeding source.  Right Heart Pressures Elevated LV EDP consistent with volume overload.  Wedge pressure of 11-15 mmHg with LVEDP of 23-25 mmHg suggesting some valvular component.  EF 25-35%   ECHO 04/23/17 Study Conclusions   - Left ventricle: The cavity size was normal. There was severe    concentric hypertrophy. Systolic function was mildly to  moderately reduced. The estimated ejection fraction was in the  range of 40% to 45%. Moderate diffuse hypokinesis with regional  variations. The study is not technically sufficient to allow  evaluation of LV diastolic function.  - Aortic valve: Trileaflet; moderately thickened, moderately  calcified leaflets. There was mild to moderate regurgitation.  Regurgitation pressure half-time: 442 ms.  - Mitral valve: There is severe mitral annular calcification of the  posterior mitral valve annulus with moderate mitral stenosis by  mean MV gradient of 40mmHg. The MVA by pressure halftime is normal  but likely inaccurate  - Left atrium: The atrium was mildly dilated.  - Pulmonary arteries: PA peak pressure: 65 mm Hg (S).  - Recommendations: Acoustic windows difficult and unable to assess  for wall motion. Recommend limited echo with definity contrast.   Impressions:   - The right ventricular systolic pressure was increased consistent  with moderate pulmonary hypertension.   Recommendations: Acoustic windows difficult and unable to assess  for wall motion. Recommend limited echo with definity contrast.    -------------------------------------------------------------------  Study data: Comparison was made to the study of 11/16/2015. Study  status: Routine. Procedure: Transthoracic echocardiography.  Image quality was adequate.     Transthoracic  echocardiography. M-mode, complete 2D, spectral Doppler, and color  Doppler. Birthdate: Patient birthdate: 1934/06/07. Age: Patient  is  86 yr old. Sex: Gender: female.  BMI: 31.7 kg/m^2. Blood  pressure:   127/60 Patient status: Inpatient. Study date:  Study date: 04/23/2017. Study time: 03:29 PM. Location: Bedside.    -------------------------------------------------------------------    -------------------------------------------------------------------  Left ventricle: The cavity size was normal. There was severe  concentric hypertrophy. Systolic function was mildly to moderately  reduced. The estimated ejection fraction was in the range of 40% to  45%. Moderate diffuse hypokinesis with regional variations. The  study is not technically sufficient to allow evaluation of LV  diastolic function.   -------------------------------------------------------------------  Aortic valve:  Trileaflet; moderately thickened, moderately  calcified leaflets. Mobility was not restricted. Doppler:  Transvalvular velocity was within the normal range. There was no  stenosis. There was mild to moderate regurgitation.   -------------------------------------------------------------------  Aorta: Aortic root: The aortic root was normal in size.   -------------------------------------------------------------------  Mitral valve: There is severe mitral annular calcification of the  posterior mitral valve annulus with moderate mitral stenosis by  mean MV gradient of 69mmHg. The MVA by pressure halftime is normal  but likely inaccurate Structurally normal valve.  Mobility was  not restricted. Doppler: Transvalvular velocity was within the  normal range. There was no evidence for stenosis. There was no  regurgitation.  Valve area by pressure half-time: 4.15 cm^2.  Indexed valve area by pressure half-time: 2.05 cm^2/m^2.  Mean  gradient (D): 8 mm Hg. Peak gradient (D): 18 mm Hg.   -------------------------------------------------------------------  Left atrium: The atrium was mildly dilated.   -------------------------------------------------------------------  Right ventricle: The cavity size was normal. Wall thickness was  normal. Pacer wire or catheter noted in right ventricle. Systolic  function was normal.   Laboratory Data:  High Sensitivity Troponin:  No results  for input(s): TROPONINIHS in the last 720 hours.   Chemistry Recent Labs  Lab 02/03/20 0701  NA 141  K 4.0  CL 103  CO2 30  GLUCOSE 91  BUN 17  CREATININE 1.07*  CALCIUM 10.1  GFRNONAA 48*  GFRAA 56*  ANIONGAP 8    Recent Labs  Lab 02/03/20 0701  PROT 8.0  ALBUMIN 3.9  AST 30  ALT 44  ALKPHOS 91  BILITOT 0.5   Hematology Recent Labs  Lab 02/03/20 0701  WBC 7.5  RBC 3.40*  HGB 10.4*  HCT 33.8*  MCV 99.4  MCH 30.6  MCHC 30.8  RDW 13.1  PLT 192   BNP Recent Labs  Lab 02/03/20 0701  BNP 968.1*    DDimer No results for input(s): DDIMER in the last 168 hours.   Radiology/Studies:  DG Chest 2 View  Result Date: 02/03/2020 CLINICAL DATA:  Dyspnea, cough EXAM: CHEST - 2 VIEW COMPARISON:  03/15/2018 FINDINGS: Stable positioning of left-sided implanted cardiac device. Unchanged cardiomegaly. Atherosclerotic calcification of the aortic knob. Low lung volumes with mildly prominent interstitial markings. No focal airspace consolidation is seen. No pleural effusion or pneumothorax. IMPRESSION: Cardiomegaly with mildly prominent bilateral interstitial markings which may reflect mild edema or atypical/viral infection. Electronically Signed   By: Davina Poke D.O.   On: 02/03/2020 08:22        NO CHEST PAIN Assessment and Plan:   1. Acute systolic/diastolic HF, just rec'd 40 IV lasix conitnue - echo pending.   2. HTN uncontrolled has rec'd coreg 6.25, imdur 30 and the lasix  Goal > 130/85 may need to add back hydralazine  3. ICD/PPM [employer]- placed in 2005, she does not know when last checked.  MDT Rep will do  today.  4. Hx NICM with cath 2016.  EF at that time 25-30% it had improved.  Will see how echo is this admit.  5. COPD per IM 6. CKD 3a per IM 7. Dementia   Per IM   For questions or updates, please contact [facility] Please consult [email or web address] for contact info under    Signed, Cecilie Kicks, NP  02/03/2020 10:32 AM   History and all data  above reviewed.  Patient examined.  I agree with the findings as above.  The patient presents with SOB.  She cannot recall the details of this but thinks she might have had SOB for several days.  She has had some edema at times.  Denies PND or orthopnea.  She is not reporting chest pain.  Echo EF appears to be about 45% and unchanged from previous.  Of note she has a pacer in place but we don't see any recent interrogation.  She denies chest pain.  She denies palpitations.  She lives in assisted living and says that she walks with a walker to the dining hall.     The patient exam reveals COR:RRR  ,  Lungs: Decreased breath sounds with crackles.    ,  Abd: Positive bowel sounds, no rebound no guarding, Ext No edema  .  All available labs, radiology testing, previous records reviewed. Agree with documented assessment and plan.   Acute on chronic systolic and diastolic HF:  Agree with plans for diuresis.  BP needs to be better controlled and we will titrate meds.  Looking back she has had elevated creat and so does not tolerate ACE/ARB ARNI.   I will add hydralazine and increase the Imdur.  I think her assisted living helps with meds so we can use TID.    Jeneen Rinks Remingtyn Depaola  3:55 PM  02/03/2020

## 2020-02-03 NOTE — Progress Notes (Signed)
Received message from [place] at [facility], Ronalee Belts. And the ALF contacted him for possible HH. Will send message to ED provider for [facility] and PT orders with F2F. Thank you

## 2020-02-04 DIAGNOSIS — I5043 Acute on chronic combined systolic (congestive) and diastolic (congestive) heart failure: Secondary | ICD-10-CM | POA: Diagnosis not present

## 2020-02-04 DIAGNOSIS — N1831 Chronic kidney disease, stage 3a: Secondary | ICD-10-CM | POA: Diagnosis not present

## 2020-02-04 LAB — CBC
HCT: 34.6 % — ABNORMAL LOW (ref 36.0–46.0)
Hemoglobin: 10.7 g/dL — ABNORMAL LOW (ref 12.0–15.0)
MCH: 29.7 pg (ref 26.0–34.0)
MCHC: 30.9 g/dL (ref 30.0–36.0)
MCV: 96.1 fL (ref 80.0–100.0)
Platelets: 211 10*3/uL (ref 150–400)
RBC: 3.6 MIL/uL — ABNORMAL LOW (ref 3.87–5.11)
RDW: 13.1 % (ref 11.5–15.5)
WBC: 8 10*3/uL (ref 4.0–10.5)
nRBC: 0 % (ref 0.0–0.2)

## 2020-02-04 LAB — BASIC METABOLIC PANEL
Anion gap: 11 (ref 5–15)
BUN: 25 mg/dL — ABNORMAL HIGH (ref 8–23)
CO2: 29 mmol/L (ref 22–32)
Calcium: 9.8 mg/dL (ref 8.9–10.3)
Chloride: 100 mmol/L (ref 98–111)
Creatinine, Ser: 1.36 mg/dL — ABNORMAL HIGH (ref 0.44–1.00)
GFR calc Af Amer: 42 mL/min — ABNORMAL LOW (ref >60)
GFR calc non Af Amer: 36 mL/min — ABNORMAL LOW (ref >60)
Glucose, Bld: 86 mg/dL (ref 70–99)
Potassium: 3.7 mmol/L (ref 3.5–5.1)
Sodium: 140 mmol/L (ref 135–145)

## 2020-02-04 NOTE — Evaluation (Signed)
Physical Therapy Evaluation Patient Details Name: [name] MRN: [id number] DOB: [birth year] Today's Date: 02/04/2020   History of Present Illness  [name] is a 86 y.o. female with a history of CHF last EF 40-45% in 2018, NICM with ICD,COPD, DM, hypertension, hyperlipidemia, hypothyroidism, pulmonary embolism not on anticoagulation, dementia, and colon cancer with colostomy who presents to the ED 02/03/20 from [facility] for evaluation of dyspnea,On chest x-ray patient has evidence of fluid overload and BNP is elevated consistent with CHF.  Clinical Impression  The patient is pleasant and able to mobilize to sitting and standing at RW and took a few steps along the bed. Patient is hopeful to return to [facility] where she reports that she was ambulating with a 4 wheeled Rw.  Pt admitted with above diagnosis.  Pt currently with functional limitations due to the deficits listed below (see PT Problem List). Pt will benefit from skilled PT to increase their independence and safety with mobility to allow discharge to the venue listed below.        Follow Up Recommendations Home health PT;Supervision/Assistance - 24 hour;Supervision for mobility/OOB vs SNF if unable to return     Equipment Recommendations  None recommended by PT    Recommendations for Other Services   OT    Precautions / Restrictions Precautions Precautions: ICD/Pacemaker;Fall      Mobility  Bed Mobility Overal bed mobility: Needs Assistance Bed Mobility: Supine to Sit;Sit to Supine     Supine to sit: Mod assist;HOB elevated Sit to supine: Mod assist   General bed mobility comments: assist with trunk and legs to sit up and to return to supine  Transfers Overall transfer level: Needs assistance   Transfers: Sit to/from Stand Sit to Stand: Mod assist         General transfer comment: steady assistm to rise from bed using RW. Took 5 side steps along the bed.  Ambulation/Gait                Stairs            Wheelchair Mobility    Modified Rankin (Stroke Patients Only)       Balance Overall balance assessment: Needs assistance Sitting-balance support: Feet supported;Bilateral upper extremity supported Sitting balance-Leahy Scale: Good     Standing balance support: Bilateral upper extremity supported;During functional activity Standing balance-Leahy Scale: Fair                               Pertinent Vitals/Pain Pain Assessment: No/denies pain    Home Living Family/patient expects to be discharged to:: Assisted living               Home Equipment: Walker - 4 wheels;Shower seat;Grab bars - tub/shower;Grab bars - toilet Additional Comments: pt reports that she, walks with rollator, toilets self    Prior Function Level of Independence: Needs assistance   Gait / Transfers Assistance Needed: pt reports walking with rollator  ADL's / Homemaking Assistance Needed: pt states she requires assist for dressing and bathing        Hand Dominance        Extremity/Trunk Assessment   Upper Extremity Assessment Upper Extremity Assessment: Generalized weakness    Lower Extremity Assessment Lower Extremity Assessment: Generalized weakness    Cervical / Trunk Assessment Cervical / Trunk Assessment: Normal  Communication      Cognition Arousal/Alertness: Awake/alert Behavior During Therapy: WFL for tasks  assessed/performed Overall Cognitive Status: History of cognitive impairments - at baseline                                 General Comments: follows  directions, oriented to place([place]) and reason for being in Ed. Oriented to wednesday      General Comments      Exercises     Assessment/Plan    PT Assessment Patient needs continued PT services  PT Problem List Decreased strength;Decreased cognition;Decreased knowledge of precautions;Decreased mobility;Decreased knowledge of use of DME;Decreased activity  tolerance;Decreased safety awareness       PT Treatment Interventions DME instruction;Functional mobility training;Gait training;Therapeutic activities;Therapeutic exercise;Patient/family education    PT Goals (Current goals can be found in the Care Plan section)  Acute Rehab PT Goals Patient Stated Goal: to get up, go back to [facility] PT Goal Formulation: With patient Time For Goal Achievement: 02/18/20 Potential to Achieve Goals: Good    Frequency Min 3X/week(hopes to return to ALf)   Barriers to discharge        Co-evaluation               AM-PAC PT "6 Clicks" Mobility  Outcome Measure Help needed turning from your back to your side while in a flat bed without using bedrails?: A Lot Help needed moving from lying on your back to sitting on the side of a flat bed without using bedrails?: A Lot Help needed moving to and from a bed to a chair (including a wheelchair)?: A Lot Help needed standing up from a chair using your arms (e.g., wheelchair or bedside chair)?: A Lot Help needed to walk in hospital room?: A Lot Help needed climbing 3-5 steps with a railing? : Total 6 Click Score: 11    End of Session Equipment Utilized During Treatment: Gait belt Activity Tolerance: Patient tolerated treatment well Patient left: in bed;with call bell/phone within reach Nurse Communication: Mobility status PT Visit Diagnosis: Unsteadiness on feet (R26.81)    Time: [id number] PT Time Calculation (min) (ACUTE ONLY): 38 min   Charges:   PT Evaluation $PT Eval Moderate Complexity: 1 Mod PT Treatments $Therapeutic Activity: 23-37 mins        [place] Pager [phone] Office [phone]   Claretha Cooper 02/04/2020, 11:01 AM

## 2020-02-04 NOTE — Progress Notes (Addendum)
Progress Note  Patient Name: [name] Date of Encounter: 02/04/2020  Primary Cardiologist: No primary care provider on file. Remote [facility]   Subjective   Coughing up thick white mucus.  But feeling better.  Eating breakfast   Inpatient Medications    Scheduled Meds: . aspirin EC  81 mg Oral Daily  . atorvastatin  20 mg Oral QHS  . carvedilol  6.25 mg Oral BID WC  . enoxaparin (LOVENOX) injection  40 mg Subcutaneous Q24H  . famotidine  40 mg Oral Daily  . hydrALAZINE  25 mg Oral Q8H  . isosorbide mononitrate  60 mg Oral Daily  . levothyroxine  50 mcg Oral QAC breakfast  . loratadine  10 mg Oral Daily  . polyethylene glycol  17 g Oral Daily  . polyvinyl alcohol  1 drop Both Eyes TID   Continuous Infusions:  PRN Meds: acetaminophen **OR** acetaminophen, alum & mag hydroxide-simeth, guaiFENesin, hydrALAZINE, magnesium hydroxide, ondansetron **OR** ondansetron (ZOFRAN) IV, sodium chloride   Vital Signs    Vitals:   02/04/20 0500 02/04/20 0530 02/04/20 0600 02/04/20 0700  BP: (!) 157/73 133/62 (!) 142/64 (!) 157/64  Pulse: 88 85 84 86  Resp: 17 (!) 23 20 (!) 23  Temp:      TempSrc:      SpO2: 94% 95% 90% 97%  Weight:      Height:        Intake/Output Summary (Last 24 hours) at 02/04/2020 0833 Last data filed at 02/03/2020 1751 Gross per 24 hour  Intake --  Output 1450 ml  Net -1450 ml   Last 3 Weights 02/03/2020 04/18/2019 09/13/2018  Weight (lbs) 189 lb 216 lb 206 lb 9.6 oz  Weight (kg) 85.73 kg 97.977 kg 93.713 kg      Telemetry    SR with V pacing - Personally Reviewed  ECG    No new - Personally Reviewed  Physical Exam   GEN: No acute distress.   Neck: No JVD Cardiac: RRR, no murmurs, rubs, or gallops.  Respiratory: coarse rhonchi  to auscultation bilaterally throughout lung fields. GI: Soft, nontender, non-distended  MS: No edema; No deformity. Neuro:  Nonfocal  Psych: Normal affect   Labs    High Sensitivity Troponin:  No results for  input(s): TROPONINIHS in the last 720 hours.    Chemistry Recent Labs  Lab 02/03/20 0701 02/04/20 0611  NA 141 140  K 4.0 3.7  CL 103 100  CO2 30 29  GLUCOSE 91 86  BUN 17 25*  CREATININE 1.07* 1.36*  CALCIUM 10.1 9.8  PROT 8.0  --   ALBUMIN 3.9  --   AST 30  --   ALT 44  --   ALKPHOS 91  --   BILITOT 0.5  --   GFRNONAA 48* 36*  GFRAA 56* 42*  ANIONGAP 8 11     Hematology Recent Labs  Lab 02/03/20 0701 02/04/20 0611  WBC 7.5 8.0  RBC 3.40* 3.60*  HGB 10.4* 10.7*  HCT 33.8* 34.6*  MCV 99.4 96.1  MCH 30.6 29.7  MCHC 30.8 30.9  RDW 13.1 13.1  PLT 192 211    BNP Recent Labs  Lab 02/03/20 0701  BNP 968.1*     DDimer No results for input(s): DDIMER in the last 168 hours.   Radiology    DG Chest 2 View  Result Date: 02/03/2020 CLINICAL DATA:  Dyspnea, cough EXAM: CHEST - 2 VIEW COMPARISON:  03/15/2018 FINDINGS: Stable positioning of left-sided implanted  cardiac device. Unchanged cardiomegaly. Atherosclerotic calcification of the aortic knob. Low lung volumes with mildly prominent interstitial markings. No focal airspace consolidation is seen. No pleural effusion or pneumothorax. IMPRESSION: Cardiomegaly with mildly prominent bilateral interstitial markings which may reflect mild edema or atypical/viral infection. Electronically Signed   By: Davina Poke D.O.   On: 02/03/2020 08:22   ECHOCARDIOGRAM COMPLETE  Result Date: 02/03/2020    ECHOCARDIOGRAM REPORT   Patient Name:   [name] Date of Exam: 02/03/2020 Medical Rec #:  [id number]     Height:       66.0 in Accession #:    [id number]    Weight:       189.0 lb Date of Birth:  Sep 10, 1934     BSA:          1.952 m Patient Age:    86 years      BP:           171/75 mmHg Patient Gender: F             HR:           92 bpm. Exam Location:  Inpatient Procedure: 2D Echo and Intracardiac Opacification Agent Indications:    CHF-Acute Diastolic [id number] / [id number]  History:        Patient has prior history of  Echocardiogram examinations, most                 recent 04/23/2017. NICM (nonischemic cardiomyopathy), ICD, COPD;                 Risk Factors:Hypertension. CKD.  Sonographer:    Vikki Ports Turrentine Referring Phys: [id number] [place]  1. Technically difficult; definity used; mild to moderate global reduction in LV systolic function; restrictive filling; mild LVH; mild AS; mild AI; mild MR; moderate LAE.  2. Left ventricular ejection fraction, by estimation, is 40 to 45%. The left ventricle has mildly decreased function. The left ventricle demonstrates global hypokinesis. There is mild left ventricular hypertrophy. Left ventricular diastolic parameters are consistent with Grade III diastolic dysfunction (restrictive).  3. Right ventricular systolic function is normal. The right ventricular size is normal. There is normal pulmonary artery systolic pressure.  4. Left atrial size was moderately dilated.  5. The mitral valve is normal in structure. Mild mitral valve regurgitation. No evidence of mitral stenosis.  6. The aortic valve is abnormal. Aortic valve regurgitation is mild. Mild aortic valve stenosis. FINDINGS  Left Ventricle: Left ventricular ejection fraction, by estimation, is 40 to 45%. The left ventricle has mildly decreased function. The left ventricle demonstrates global hypokinesis. Definity contrast agent was given IV to delineate the left ventricular  endocardial borders. The left ventricular internal cavity size was normal in size. There is mild left ventricular hypertrophy. Left ventricular diastolic parameters are consistent with Grade III diastolic dysfunction (restrictive). Right Ventricle: The right ventricular size is normal.Right ventricular systolic function is normal. There is normal pulmonary artery systolic pressure. The tricuspid regurgitant velocity is 2.55 m/s, and with an assumed right atrial pressure of 3 mmHg, the estimated right ventricular systolic pressure is 0000000 mmHg.  Left Atrium: Left atrial size was moderately dilated. Right Atrium: Right atrial size was normal in size. Pericardium: Trivial pericardial effusion is present. Mitral Valve: The mitral valve is normal in structure. Normal mobility of the mitral valve leaflets. Severe mitral annular calcification. Mild mitral valve regurgitation. No evidence of mitral valve stenosis. Tricuspid Valve: The tricuspid valve is normal in structure. Tricuspid  valve regurgitation is mild . No evidence of tricuspid stenosis. Aortic Valve: The aortic valve is abnormal. Aortic valve regurgitation is mild. Aortic regurgitation PHT measures 325 msec. Mild aortic stenosis is present. Aortic valve mean gradient measures 7.3 mmHg. Aortic valve peak gradient measures 12.0 mmHg. Aortic valve area, by VTI measures 1.43 cm. Pulmonic Valve: The pulmonic valve was not well visualized. Pulmonic valve regurgitation is mild. No evidence of pulmonic stenosis. Aorta: The aortic root is normal in size and structure. Venous: The inferior vena cava was not well visualized.  Additional Comments: Technically difficult; definity used; mild to moderate global reduction in LV systolic function; restrictive filling; mild LVH; mild AS; mild AI; mild MR; moderate LAE. A pacer wire is visualized.  LEFT VENTRICLE PLAX 2D LVIDd:         4.53 cm LVIDs:         3.59 cm LV PW:         1.19 cm LV IVS:        1.18 cm LVOT diam:     1.90 cm LV SV:         41 LV SV Index:   21 LVOT Area:     2.84 cm  RIGHT VENTRICLE RV S prime:     10.40 cm/s TAPSE (M-mode): 1.6 cm LEFT ATRIUM             Index LA diam:        3.60 cm 1.84 cm/m LA Vol (A2C):   89.4 ml 45.79 ml/m LA Vol (A4C):   89.5 ml 45.84 ml/m LA Biplane Vol: 90.4 ml 46.30 ml/m  AORTIC VALVE AV Area (Vmax):    1.73 cm AV Area (Vmean):   1.39 cm AV Area (VTI):     1.43 cm AV Vmax:           173.33 cm/s AV Vmean:          128.667 cm/s AV VTI:            0.288 m AV Peak Grad:      12.0 mmHg AV Mean Grad:      7.3 mmHg  LVOT Vmax:         106.00 cm/s LVOT Vmean:        62.900 cm/s LVOT VTI:          0.145 m LVOT/AV VTI ratio: 0.50 AI PHT:            325 msec  AORTA Ao Root diam: 3.10 cm MITRAL VALVE                TRICUSPID VALVE MV Area (PHT): 5.66 cm     TR Peak grad:   26.0 mmHg MV Decel Time: 134 msec     TR Vmax:        255.00 cm/s MR Peak grad: 121.9 mmHg MR Mean grad: 75.0 mmHg     SHUNTS MR Vmax:      552.00 cm/s   Systemic VTI:  0.14 m MR Vmean:     410.0 cm/s    Systemic Diam: 1.90 cm MV E velocity: 192.00 cm/s MV A velocity: 42.60 cm/s MV E/A ratio:  4.51 Kirk Ruths MD Electronically signed by Kirk Ruths MD Signature Date/Time: 02/03/2020/3:35:40 PM    Final     Cardiac Studies   Echo 02/03/20 IMPRESSIONS    1. Technically difficult; definity used; mild to moderate global  reduction in LV systolic function; restrictive filling; mild LVH; mild AS;  mild  AI; mild MR; moderate LAE.  2. Left ventricular ejection fraction, by estimation, is 40 to 45%. The  left ventricle has mildly decreased function. The left ventricle  demonstrates global hypokinesis. There is mild left ventricular  hypertrophy. Left ventricular diastolic parameters  are consistent with Grade III diastolic dysfunction (restrictive).  3. Right ventricular systolic function is normal. The right ventricular  size is normal. There is normal pulmonary artery systolic pressure.  4. Left atrial size was moderately dilated.  5. The mitral valve is normal in structure. Mild mitral valve  regurgitation. No evidence of mitral stenosis.  6. The aortic valve is abnormal. Aortic valve regurgitation is mild. Mild  aortic valve stenosis.   FINDINGS  Left Ventricle: Left ventricular ejection fraction, by estimation, is 40  to 45%. The left ventricle has mildly decreased function. The left  ventricle demonstrates global hypokinesis. Definity contrast agent was  given IV to delineate the left ventricular  endocardial borders. The  left ventricular internal cavity size was normal  in size. There is mild left ventricular hypertrophy. Left ventricular  diastolic parameters are consistent with Grade III diastolic dysfunction  (restrictive).   Right Ventricle: The right ventricular size is normal.Right ventricular  systolic function is normal. There is normal pulmonary artery systolic  pressure. The tricuspid regurgitant velocity is 2.55 m/s, and with an  assumed right atrial pressure of 3 mmHg,  the estimated right ventricular systolic pressure is 0000000 mmHg.   Left Atrium: Left atrial size was moderately dilated.   Right Atrium: Right atrial size was normal in size.   Pericardium: Trivial pericardial effusion is present.   Mitral Valve: The mitral valve is normal in structure. Normal mobility of  the mitral valve leaflets. Severe mitral annular calcification. Mild  mitral valve regurgitation. No evidence of mitral valve stenosis.   Tricuspid Valve: The tricuspid valve is normal in structure. Tricuspid  valve regurgitation is mild . No evidence of tricuspid stenosis.   Aortic Valve: The aortic valve is abnormal. Aortic valve regurgitation is  mild. Aortic regurgitation PHT measures 325 msec. Mild aortic stenosis is  present. Aortic valve mean gradient measures 7.3 mmHg. Aortic valve peak  gradient measures 12.0 mmHg.  Aortic valve area, by VTI measures 1.43 cm.   Pulmonic Valve: The pulmonic valve was not well visualized. Pulmonic valve  regurgitation is mild. No evidence of pulmonic stenosis.   Aorta: The aortic root is normal in size and structure.   Venous: The inferior vena cava was not well visualized.     Additional Comments: Technically difficult; definity used; mild to  moderate global reduction in LV systolic function; restrictive filling;  mild LVH; mild AS; mild AI; mild MR; moderate LAE. A pacer wire is  visualized.   Patient Profile     86 y.o. 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    Assessment & Plan    1. Acute systolic/diastolic HF,  And by echo EF stable at 40-45%, though now G3DD.   She is neg 1450, not sure how accurate I&O no wt today   2. HTN uncontrolled has rec'd coreg 6.25, imdur 30 and the lasix  Goal > 130/85 --improved BP today 142/64 to 157/64  With addition of hydralazine.  3. ICD/PPM [employer]- placed in 2005, she does  not know when last checked.  MDT Rep has evaluated THIS device placed 2013 and last check 2018, 18 month battery life.  No ICD shocks  4. Hx NICM with cath 2016.  EF at that time 25-30% it had improved.  EF now stable at 40-45% with G3DD  5. COPD per IM 6. CKD 3a per IM Cr has increased a little now 1.36  7. Dementia   Per IM   For questions or updates, please contact [facility] Please consult [email or web address] for contact info under     Signed, Cecilie Kicks, NP  02/04/2020, 8:33 AM     History and all data above reviewed.  Patient examined.  I agree with the findings as above. The patient feels better but not at baseline.  She reports coughing up sputum.    The patient exam reveals COR:RRR  ,  Lungs: Basilar crackles  ,  Abd: Positive bowel sounds, no rebound no guarding, Ext Trace edema  .  All available labs, radiology testing, previous records reviewed. Agree with documented assessment and plan.   Acute on chronic systolic and diastolic HF:  She received one dose of IV Lasix today and we can reassess the creat in the morning.  I am not anticipating more aggressive treatment or any further in patient exams.    Jeneen Rinks Honor Frison  11:08 AM  02/04/2020

## 2020-02-04 NOTE — Progress Notes (Signed)
PROGRESS NOTE  [name]  DOB: Aug 07, 1934  PCP: [name], [place] [id number]  DOA: 02/03/2020  LOS: 0 days   Chief Complaint  Patient presents with  . Shortness of Breath   Brief narrative: [name] is a 86 y.o. female with PMH of nonischemic cardiomyopathy with ICD, chronic combined systolic and diastolic CHF with EF 40 to 45% from 2018, moderate pulmonary hypertension, normal coronaries, HTN, HLD, CKD 3, dementia, hypothyroidism, rectal cancer sp resection and colostomy, history of open embolism who lives in an assisted living facility. Patient presented to the ED on 02/03/2020 with complaint of worsening shortness of breath, cough, wheezing for 1 to 2 days. She has had 2 doses of her Covid vaccine.  In the ED, she was found to be tachypneic. Chest x-ray showed cardiomegaly with prominent bilateral interstitial markings suspicious of pulmonary edema.   BNP was elevated to 968. Patient was admitted to hospitalist service for further evaluation and management.   Subjective: Patient was seen and examined this morning.  Elderly African-American female with dementia, lying on bed.  Not in distress.  Not on supplemental oxygen.  Pedal edema seems to be improving, skin streaking noted. Colostomy bag with brown stool output.  Assessment/Plan: Acute decompensation of chronic combined systolic and diastolic CHF Essential hypertension -Presented with worsening shortness of breath, wheezing. -Chest x-ray with pulmonary edema, BNP elevated -Home meds include Carvedilol 6.25 mg twice daily, Lasix 40 mg daily, Imdur 30 mg daily. -Currently on IV Lasix.  Creatinine up this morning.  So I stopped IV Lasix. -Continue Coreg.  Nitrate dose has been increased.  Hydralazine was also added because of persistent hypotension. -Continue fluid restriction, daily weight, strict intake and output monitoring. -Cardiology following. -Not on supplemental oxygen.  Not on antibiotics.  Nonischemic  cardiomyopathy ICD in place -Hx NICM with cath 2016. EF at that time 25-30% with subsequent improvement, now EF 40 to 45% with grade 3 diastolic dysfunction. -ICD/PPM [employer]- pacemaker interrogation this morning.   -Normal coronaries on last cath.  Remains on aspirin and statin  AKI on CKD 3 a -Creatinine normal at presentation at 1.07. With IV Lasix, creatinine elevated to 1.36. -I held the morning dose of IV Lasix. -Repeat creatinine in the morning.  Anemia of chronic disease -Hemoglobin stable.  Continue iron supplement.  Hypothyroidism -Continue Synthroid  Dementia -Continue supportive care.  COPD -Currently stable.  Continue inhalers.   History of rectal cancer status post colostomy -Continue colostomy care.  Mobility: PT recommended home with PT. Code Status:  Full code per chart  DVT prophylaxis:  Lovenox Antimicrobials:  None Fluid: None Diet: Cardiac diet  Consultants: Cardiology Family Communication:  None at bedside  Status is: Observation The patient needs to stay another night for monitoring of her heart rate, blood pressure, renal function.  Repeat creatinine tomorrow.  If not worsening, can be discharged tomorrow. Dispo: The patient is from: ALF              Anticipated d/c is to: ALF              Anticipated d/c date is: 1 day              Patient currently is not medically stable to d/c.  Antimicrobials: Anti-infectives (From admission, onward)   None        Code Status: Full Code   Diet Order            Diet Heart Room service appropriate? Yes; Fluid  consistency: Thin; Fluid restriction: 1200 mL Fluid  Diet effective now              Infusions:    Scheduled Meds: . aspirin EC  81 mg Oral Daily  . atorvastatin  20 mg Oral QHS  . carvedilol  6.25 mg Oral BID WC  . enoxaparin (LOVENOX) injection  40 mg Subcutaneous Q24H  . famotidine  40 mg Oral Daily  . hydrALAZINE  25 mg Oral Q8H  . isosorbide mononitrate  60 mg Oral  Daily  . levothyroxine  50 mcg Oral QAC breakfast  . loratadine  10 mg Oral Daily  . polyethylene glycol  17 g Oral Daily  . polyvinyl alcohol  1 drop Both Eyes TID    PRN meds: acetaminophen **OR** acetaminophen, alum & mag hydroxide-simeth, guaiFENesin, hydrALAZINE, magnesium hydroxide, ondansetron **OR** ondansetron (ZOFRAN) IV, sodium chloride   Objective: Vitals:   02/04/20 0700 02/04/20 0830  BP: (!) 157/64 (!) 143/79  Pulse: 86 93  Resp: (!) 23 (!) 21  Temp:    SpO2: 97% 100%    Intake/Output Summary (Last 24 hours) at 02/04/2020 1122 Last data filed at 02/03/2020 1751 Gross per 24 hour  Intake --  Output 650 ml  Net -650 ml   Filed Weights   02/03/20 0900  Weight: 85.7 kg   Weight change:  Body mass index is 30.51 kg/m.   Physical Exam: General exam: Appears calm and comfortable.  Skin: No rashes, lesions or ulcers. HEENT: Atraumatic, normocephalic, supple neck, no obvious bleeding Lungs: Clear to auscultation bilaterally CVS: Regular rate, rhythm, no murmur GI/Abd soft, nontender, nondistended, bowel sound present, colostomy bag with yellow stool CNS: Alert, awake, demented at baseline Psychiatry: Mood appropriate Extremities: Pedal edema seem to be improving, skin shrinking seen  Data Review: I have personally reviewed the laboratory data and studies available.  Recent Labs  Lab 02/03/20 0701 02/04/20 0611  WBC 7.5 8.0  NEUTROABS 4.8  --   HGB 10.4* 10.7*  HCT 33.8* 34.6*  MCV 99.4 96.1  PLT 192 211   Recent Labs  Lab 02/03/20 0701 02/04/20 0611  NA 141 140  K 4.0 3.7  CL 103 100  CO2 30 29  GLUCOSE 91 86  BUN 17 25*  CREATININE 1.07* 1.36*  CALCIUM 10.1 9.8   Signed, Terrilee Croak, MD [facility] Pager: [phone] (Secure Chat preferred). 02/04/2020

## 2020-02-05 DIAGNOSIS — Z933 Colostomy status: Secondary | ICD-10-CM | POA: Diagnosis not present

## 2020-02-05 DIAGNOSIS — M109 Gout, unspecified: Secondary | ICD-10-CM | POA: Diagnosis present

## 2020-02-05 DIAGNOSIS — J449 Chronic obstructive pulmonary disease, unspecified: Secondary | ICD-10-CM | POA: Diagnosis present

## 2020-02-05 DIAGNOSIS — E860 Dehydration: Secondary | ICD-10-CM | POA: Diagnosis not present

## 2020-02-05 DIAGNOSIS — G9349 Other encephalopathy: Secondary | ICD-10-CM | POA: Diagnosis not present

## 2020-02-05 DIAGNOSIS — F0391 Unspecified dementia with behavioral disturbance: Secondary | ICD-10-CM | POA: Diagnosis not present

## 2020-02-05 DIAGNOSIS — G9340 Encephalopathy, unspecified: Secondary | ICD-10-CM | POA: Diagnosis not present

## 2020-02-05 DIAGNOSIS — I509 Heart failure, unspecified: Secondary | ICD-10-CM | POA: Insufficient documentation

## 2020-02-05 DIAGNOSIS — I5033 Acute on chronic diastolic (congestive) heart failure: Secondary | ICD-10-CM | POA: Diagnosis not present

## 2020-02-05 DIAGNOSIS — I5043 Acute on chronic combined systolic (congestive) and diastolic (congestive) heart failure: Secondary | ICD-10-CM | POA: Diagnosis present

## 2020-02-05 DIAGNOSIS — D631 Anemia in chronic kidney disease: Secondary | ICD-10-CM | POA: Diagnosis present

## 2020-02-05 DIAGNOSIS — I428 Other cardiomyopathies: Secondary | ICD-10-CM | POA: Diagnosis present

## 2020-02-05 DIAGNOSIS — I5022 Chronic systolic (congestive) heart failure: Secondary | ICD-10-CM | POA: Diagnosis not present

## 2020-02-05 DIAGNOSIS — G309 Alzheimer's disease, unspecified: Secondary | ICD-10-CM | POA: Diagnosis not present

## 2020-02-05 DIAGNOSIS — E785 Hyperlipidemia, unspecified: Secondary | ICD-10-CM | POA: Diagnosis present

## 2020-02-05 DIAGNOSIS — Z20822 Contact with and (suspected) exposure to covid-19: Secondary | ICD-10-CM | POA: Diagnosis present

## 2020-02-05 DIAGNOSIS — N179 Acute kidney failure, unspecified: Secondary | ICD-10-CM

## 2020-02-05 DIAGNOSIS — Z9841 Cataract extraction status, right eye: Secondary | ICD-10-CM | POA: Diagnosis not present

## 2020-02-05 DIAGNOSIS — Z9049 Acquired absence of other specified parts of digestive tract: Secondary | ICD-10-CM | POA: Diagnosis not present

## 2020-02-05 DIAGNOSIS — I080 Rheumatic disorders of both mitral and aortic valves: Secondary | ICD-10-CM | POA: Diagnosis present

## 2020-02-05 DIAGNOSIS — D509 Iron deficiency anemia, unspecified: Secondary | ICD-10-CM | POA: Diagnosis present

## 2020-02-05 DIAGNOSIS — E039 Hypothyroidism, unspecified: Secondary | ICD-10-CM | POA: Diagnosis present

## 2020-02-05 DIAGNOSIS — I447 Left bundle-branch block, unspecified: Secondary | ICD-10-CM | POA: Diagnosis present

## 2020-02-05 DIAGNOSIS — N1831 Chronic kidney disease, stage 3a: Secondary | ICD-10-CM | POA: Diagnosis present

## 2020-02-05 DIAGNOSIS — I130 Hypertensive heart and chronic kidney disease with heart failure and stage 1 through stage 4 chronic kidney disease, or unspecified chronic kidney disease: Secondary | ICD-10-CM | POA: Diagnosis present

## 2020-02-05 DIAGNOSIS — I272 Pulmonary hypertension, unspecified: Secondary | ICD-10-CM | POA: Diagnosis present

## 2020-02-05 DIAGNOSIS — Z90710 Acquired absence of both cervix and uterus: Secondary | ICD-10-CM | POA: Diagnosis not present

## 2020-02-05 DIAGNOSIS — R55 Syncope and collapse: Secondary | ICD-10-CM | POA: Diagnosis not present

## 2020-02-05 DIAGNOSIS — R0602 Shortness of breath: Secondary | ICD-10-CM | POA: Diagnosis present

## 2020-02-05 DIAGNOSIS — Z95810 Presence of automatic (implantable) cardiac defibrillator: Secondary | ICD-10-CM | POA: Diagnosis not present

## 2020-02-05 LAB — BASIC METABOLIC PANEL
Anion gap: 13 (ref 5–15)
BUN: 41 mg/dL — ABNORMAL HIGH (ref 8–23)
CO2: 27 mmol/L (ref 22–32)
Calcium: 9.4 mg/dL (ref 8.9–10.3)
Chloride: 99 mmol/L (ref 98–111)
Creatinine, Ser: 2.27 mg/dL — ABNORMAL HIGH (ref 0.44–1.00)
GFR calc Af Amer: 22 mL/min — ABNORMAL LOW (ref >60)
GFR calc non Af Amer: 19 mL/min — ABNORMAL LOW (ref >60)
Glucose, Bld: 78 mg/dL (ref 70–99)
Potassium: 3.8 mmol/L (ref 3.5–5.1)
Sodium: 139 mmol/L (ref 135–145)

## 2020-02-05 LAB — GLUCOSE, CAPILLARY: Glucose-Capillary: 130 mg/dL — ABNORMAL HIGH (ref 70–99)

## 2020-02-05 MED ORDER — FAMOTIDINE 20 MG PO TABS
20.0000 mg | ORAL_TABLET | Freq: Every day | ORAL | Status: DC
  Administered 2020-02-05 – 2020-02-09 (×5): 20 mg via ORAL
  Filled 2020-02-05 (×5): qty 1

## 2020-02-05 MED ORDER — SODIUM CHLORIDE 0.9 % IV SOLN: INTRAVENOUS | Status: DC

## 2020-02-05 MED ORDER — ENOXAPARIN SODIUM 30 MG/0.3ML ~~LOC~~ SOLN
30.0000 mg | SUBCUTANEOUS | Status: DC
  Administered 2020-02-05 – 2020-02-06 (×2): 30 mg via SUBCUTANEOUS
  Filled 2020-02-05 (×2): qty 0.3

## 2020-02-05 MED ORDER — SODIUM CHLORIDE 0.9 % IV SOLN: INTRAVENOUS | Status: AC

## 2020-02-05 NOTE — TOC Initial Note (Signed)
Transition of Care [facility]) - Initial/Assessment Note    Patient Details  Name: [name] MRN: [id number] Date of Birth: 05/02/1934  Transition of [facility]) CM/SW Contact:    Dessa Phi, RN Phone Number: 02/05/2020, 11:27 AM  Clinical Narrative: Confirmed patient from ALF-[facility]-rep RoxannaIADL's,uses rollator w/ambulation,indep w/colostomy. PT-recc HHPT.await OT eval.  Faxed w/confirmation fl2 to [employer] for transport back.               Expected Discharge Plan: Assisted Living Barriers to Discharge: Continued Medical Work up   Patient Goals and CMS Choice Patient states their goals for this hospitalization and ongoing recovery are:: go back to [facility].      Expected Discharge Plan and Services Expected Discharge Plan: Assisted Living   Discharge Planning Services: CM Consult Post Acute Care Choice: [place] arrangements for the past 2 months: Assisted Living Facility                           HH Arranged: PT, OT [facility] Agency: Other - See comment([place]-contract w/facility for PT/OT.) Date HH Agency Contacted: 02/05/20 Time [place]: 1126 Representative spoke with at [place]: [place] @ [place]  Prior Living Arrangements/Services Living arrangements for the past 2 months: [place]   Patient language and need for interpreter reviewed:: Yes        Need for Family Participation in Patient Care: No (Comment) Care giver support system in place?: Yes (comment) Current home services: DME(rollator) Criminal Activity/Legal Involvement Pertinent to Current Situation/Hospitalization: No - Comment as needed  Activities of Daily Living Home Assistive Devices/Equipment: Eyeglasses, Dentures (specify type), Grab bars around toilet, Grab bars in shower, Hand-held shower hose, Hospital bed, Walker (specify type), Ostomy supplies(upper denture, lower partial plate, front wheeled walker,  colostomy) ADL Screening (condition at time of admission) Patient's cognitive ability adequate to safely complete daily activities?: Yes Is the patient deaf or have difficulty hearing?: No Does the patient have difficulty seeing, even when wearing glasses/contacts?: No Does the patient have difficulty concentrating, remembering, or making decisions?: No Patient able to express need for assistance with ADLs?: Yes Does the patient have difficulty dressing or bathing?: Yes Independently performs ADLs?: No Communication: Independent Dressing (OT): Needs assistance Is this a change from baseline?: Pre-admission baseline Grooming: Needs assistance Is this a change from baseline?: Pre-admission baseline Feeding: Needs assistance Is this a change from baseline?: Pre-admission baseline Bathing: Needs assistance Is this a change from baseline?: Pre-admission baseline Toileting: Needs assistance Is this a change from baseline?: Pre-admission baseline In/Out Bed: Needs assistance Is this a change from baseline?: Pre-admission baseline Walks in Home: Needs assistance Is this a change from baseline?: Pre-admission baseline Does the patient have difficulty walking or climbing stairs?: Yes(normally does not use stairs) Weakness of Legs: Both Weakness of Arms/Hands: None  Permission Sought/Granted Permission sought to share information with : Case Manager Permission granted to share information with : Yes, Verbal Permission Granted  Share Information with NAME: Case manager  Permission granted to share info w AGENCY: [place] granted to share info w Relationship: [name] corbett son [phone]     Emotional Assessment Appearance:: Appears stated age Attitude/Demeanor/Rapport: Gracious Affect (typically observed): Accepting Orientation: : Oriented to Self, Oriented to Place, Oriented to  Time, Oriented to Situation Alcohol / Substance Use: Not Applicable Psych Involvement:  No (comment)  Admission diagnosis:  CHF exacerbation ([place]) [I50.9] Acute on  chronic congestive heart failure, unspecified heart failure type [facility]) [I50.9] Patient Active Problem List   Diagnosis Date Noted  . CHF exacerbation ([place]) 02/03/2020  . Chronic combined systolic and diastolic CHF (congestive heart failure) ([place]) 04/18/2019  . Personal history of gout 08/28/2018  . Recurrent parastomal hernia at LLQ colostomy 08/21/2018  . SBO (small bowel obstruction) ([place]) 08/21/2018  . Chest pain 10/28/2017  . Aspiration of liquid 10/28/2017  . Bilateral lower extremity edema   . Pneumonia 04/22/2017  . Altered mental status   . Hypotension 02/16/2017  . Syncope 02/02/2016  . Faintness   . Essential hypertension 11/16/2015  . Hypothyroidism 11/16/2015  . Acute on chronic systolic CHF (congestive heart failure) ([place]) 11/15/2015  . Hypertensive heart disease 10/04/2015  . Elevated troponin 10/04/2015  . Acute respiratory failure ([place])   . Acute systolic congestive heart failure ([place])   . NICM (nonischemic cardiomyopathy) ([place]) 08/11/2015  . Normal coronary arteries 08/11/2015  . Dyslipidemia 07/27/2015  . CKD (chronic kidney disease) stage 3, GFR 30-59 ml/min 07/27/2015  . Anemia 07/27/2015  . Benign essential HTN 07/27/2015  . FUO (fever of unknown origin) 07/27/2015  . IDA (iron deficiency anemia) 07/27/2015  . Chronic systolic CHF (congestive heart failure) ([place]) 07/27/2015  . Rectal cancer ypTypN0 (0/13) s/p chemoXRT/ APR/colostomy 2006 07/27/2015  . Sepsis ([place]) 07/24/2015  . Acute encephalopathy 07/24/2015  . Hypokalemia 07/24/2015  . Dementia ([place]) 07/23/2015  . COPD (chronic obstructive pulmonary disease) ([place]) 04/25/2015  . ICD in place 04/14/2015  . Colostomy in place [facility]) 03/01/2015  . Pacemaker 11/07/2012  . Anxiety 10/04/2011   PCP:  [name], PA Pharmacy:  No Pharmacies Listed    Social Determinants of Health (SDOH) Interventions    Readmission Risk  Interventions Readmission Risk Prevention Plan 04/24/2019 04/21/2019  Transportation Screening - Complete  HRI or [place] - Complete  Social Work Consult for [place] Planning/Counseling - Complete  Palliative Care Screening Not Applicable -  Some recent data might be hidden

## 2020-02-05 NOTE — Progress Notes (Signed)
Progress Note  Patient Name: [name] Date of Encounter: 02/05/2020  Primary Cardiologist:   No primary care provider on file.   Subjective   She denies chest pain or SOB.  Mild cough  Inpatient Medications    Scheduled Meds: . aspirin EC  81 mg Oral Daily  . atorvastatin  20 mg Oral QHS  . carvedilol  6.25 mg Oral BID WC  . enoxaparin (LOVENOX) injection  30 mg Subcutaneous Q24H  . famotidine  20 mg Oral Daily  . hydrALAZINE  25 mg Oral Q8H  . isosorbide mononitrate  60 mg Oral Daily  . levothyroxine  50 mcg Oral QAC breakfast  . loratadine  10 mg Oral Daily  . polyethylene glycol  17 g Oral Daily  . polyvinyl alcohol  1 drop Both Eyes TID   Continuous Infusions: . sodium chloride 50 mL/hr at 02/05/20 0928   PRN Meds: acetaminophen **OR** acetaminophen, alum & mag hydroxide-simeth, guaiFENesin, hydrALAZINE, magnesium hydroxide, ondansetron **OR** ondansetron (ZOFRAN) IV, sodium chloride   Vital Signs    Vitals:   02/05/20 0824 02/05/20 0910 02/05/20 0920 02/05/20 0930  BP: (!) 117/58 127/75 99/65 (!) 125/52  Pulse: 92 80 76 91  Resp:      Temp:      TempSrc:      SpO2:  98%  97%  Weight:      Height:        Intake/Output Summary (Last 24 hours) at 02/05/2020 1231 Last data filed at 02/05/2020 1000 Gross per 24 hour  Intake 85.54 ml  Output 400 ml  Net -314.46 ml   Filed Weights   02/03/20 0900 02/05/20 0454  Weight: 85.7 kg 88.8 kg    Telemetry    NSR with ventricular pacing - Personally Reviewed  ECG    NA - Personally Reviewed  Physical Exam   GEN: No acute distress.   Neck: No  JVD Cardiac: RRR,  murmurs, rubs, or gallops.  Respiratory:   Bilateral basilar crackles GI: Soft, nontender, non-distended  MS: No edema; No deformity. Neuro:  Nonfocal  Psych: Normal affect   Labs    Chemistry Recent Labs  Lab 02/03/20 0701 02/04/20 0611 02/05/20 0505  NA 141 140 139  K 4.0 3.7 3.8  CL 103 100 99  CO2 30 29 27   GLUCOSE 91 86  78  BUN 17 25* 41*  CREATININE 1.07* 1.36* 2.27*  CALCIUM 10.1 9.8 9.4  PROT 8.0  --   --   ALBUMIN 3.9  --   --   AST 30  --   --   ALT 44  --   --   ALKPHOS 91  --   --   BILITOT 0.5  --   --   GFRNONAA 48* 36* 19*  GFRAA 56* 42* 22*  ANIONGAP 8 11 13      Hematology Recent Labs  Lab 02/03/20 0701 02/04/20 0611  WBC 7.5 8.0  RBC 3.40* 3.60*  HGB 10.4* 10.7*  HCT 33.8* 34.6*  MCV 99.4 96.1  MCH 30.6 29.7  MCHC 30.8 30.9  RDW 13.1 13.1  PLT 192 211    Cardiac EnzymesNo results for input(s): TROPONINI in the last 168 hours. No results for input(s): TROPIPOC in the last 168 hours.   BNP Recent Labs  Lab 02/03/20 0701  BNP 968.1*     DDimer No results for input(s): DDIMER in the last 168 hours.   Radiology    ECHOCARDIOGRAM COMPLETE  Result Date:  02/03/2020    ECHOCARDIOGRAM REPORT   Patient Name:   [name] Date of Exam: 02/03/2020 Medical Rec #:  [id number]     Height:       66.0 in Accession #:    [id number]    Weight:       189.0 lb Date of Birth:  23-May-1934     BSA:          1.952 m Patient Age:    86 years      BP:           171/75 mmHg Patient Gender: F             HR:           92 bpm. Exam Location:  Inpatient Procedure: 2D Echo and Intracardiac Opacification Agent Indications:    CHF-Acute Diastolic [id number] / [id number]  History:        Patient has prior history of Echocardiogram examinations, most                 recent 04/23/2017. NICM (nonischemic cardiomyopathy), ICD, COPD;                 Risk Factors:Hypertension. CKD.  Sonographer:    Vikki Ports Turrentine Referring Phys: [id number] [place]  1. Technically difficult; definity used; mild to moderate global reduction in LV systolic function; restrictive filling; mild LVH; mild AS; mild AI; mild MR; moderate LAE.  2. Left ventricular ejection fraction, by estimation, is 40 to 45%. The left ventricle has mildly decreased function. The left ventricle demonstrates global hypokinesis. There is mild left  ventricular hypertrophy. Left ventricular diastolic parameters are consistent with Grade III diastolic dysfunction (restrictive).  3. Right ventricular systolic function is normal. The right ventricular size is normal. There is normal pulmonary artery systolic pressure.  4. Left atrial size was moderately dilated.  5. The mitral valve is normal in structure. Mild mitral valve regurgitation. No evidence of mitral stenosis.  6. The aortic valve is abnormal. Aortic valve regurgitation is mild. Mild aortic valve stenosis. FINDINGS  Left Ventricle: Left ventricular ejection fraction, by estimation, is 40 to 45%. The left ventricle has mildly decreased function. The left ventricle demonstrates global hypokinesis. Definity contrast agent was given IV to delineate the left ventricular  endocardial borders. The left ventricular internal cavity size was normal in size. There is mild left ventricular hypertrophy. Left ventricular diastolic parameters are consistent with Grade III diastolic dysfunction (restrictive). Right Ventricle: The right ventricular size is normal.Right ventricular systolic function is normal. There is normal pulmonary artery systolic pressure. The tricuspid regurgitant velocity is 2.55 m/s, and with an assumed right atrial pressure of 3 mmHg, the estimated right ventricular systolic pressure is 0000000 mmHg. Left Atrium: Left atrial size was moderately dilated. Right Atrium: Right atrial size was normal in size. Pericardium: Trivial pericardial effusion is present. Mitral Valve: The mitral valve is normal in structure. Normal mobility of the mitral valve leaflets. Severe mitral annular calcification. Mild mitral valve regurgitation. No evidence of mitral valve stenosis. Tricuspid Valve: The tricuspid valve is normal in structure. Tricuspid valve regurgitation is mild . No evidence of tricuspid stenosis. Aortic Valve: The aortic valve is abnormal. Aortic valve regurgitation is mild. Aortic regurgitation PHT  measures 325 msec. Mild aortic stenosis is present. Aortic valve mean gradient measures 7.3 mmHg. Aortic valve peak gradient measures 12.0 mmHg. Aortic valve area, by VTI measures 1.43 cm. Pulmonic Valve: The pulmonic valve was not well visualized. Pulmonic valve  regurgitation is mild. No evidence of pulmonic stenosis. Aorta: The aortic root is normal in size and structure. Venous: The inferior vena cava was not well visualized.  Additional Comments: Technically difficult; definity used; mild to moderate global reduction in LV systolic function; restrictive filling; mild LVH; mild AS; mild AI; mild MR; moderate LAE. A pacer wire is visualized.  LEFT VENTRICLE PLAX 2D LVIDd:         4.53 cm LVIDs:         3.59 cm LV PW:         1.19 cm LV IVS:        1.18 cm LVOT diam:     1.90 cm LV SV:         41 LV SV Index:   21 LVOT Area:     2.84 cm  RIGHT VENTRICLE RV S prime:     10.40 cm/s TAPSE (M-mode): 1.6 cm LEFT ATRIUM             Index LA diam:        3.60 cm 1.84 cm/m LA Vol (A2C):   89.4 ml 45.79 ml/m LA Vol (A4C):   89.5 ml 45.84 ml/m LA Biplane Vol: 90.4 ml 46.30 ml/m  AORTIC VALVE AV Area (Vmax):    1.73 cm AV Area (Vmean):   1.39 cm AV Area (VTI):     1.43 cm AV Vmax:           173.33 cm/s AV Vmean:          128.667 cm/s AV VTI:            0.288 m AV Peak Grad:      12.0 mmHg AV Mean Grad:      7.3 mmHg LVOT Vmax:         106.00 cm/s LVOT Vmean:        62.900 cm/s LVOT VTI:          0.145 m LVOT/AV VTI ratio: 0.50 AI PHT:            325 msec  AORTA Ao Root diam: 3.10 cm MITRAL VALVE                TRICUSPID VALVE MV Area (PHT): 5.66 cm     TR Peak grad:   26.0 mmHg MV Decel Time: 134 msec     TR Vmax:        255.00 cm/s MR Peak grad: 121.9 mmHg MR Mean grad: 75.0 mmHg     SHUNTS MR Vmax:      552.00 cm/s   Systemic VTI:  0.14 m MR Vmean:     410.0 cm/s    Systemic Diam: 1.90 cm MV E velocity: 192.00 cm/s MV A velocity: 42.60 cm/s MV E/A ratio:  4.51 Kirk Ruths MD Electronically signed by Kirk Ruths MD Signature Date/Time: 02/03/2020/3:35:40 PM    Final     Cardiac Studies   ECHO 02/03/20  1. Technically difficult; definity used; mild to moderate global  reduction in LV systolic function; restrictive filling; mild LVH; mild AS;  mild AI; mild MR; moderate LAE.  2. Left ventricular ejection fraction, by estimation, is 40 to 45%. The  left ventricle has mildly decreased function. The left ventricle  demonstrates global hypokinesis. There is mild left ventricular  hypertrophy. Left ventricular diastolic parameters  are consistent with Grade III diastolic dysfunction (restrictive).  3. Right ventricular systolic function is normal. The right ventricular  size is normal. There is  normal pulmonary artery systolic pressure.  4. Left atrial size was moderately dilated.  5. The mitral valve is normal in structure. Mild mitral valve  regurgitation. No evidence of mitral stenosis.  6. The aortic valve is abnormal. Aortic valve regurgitation is mild. Mild  aortic valve stenosis.   Patient Profile     86 y.o. 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   Assessment & Plan    Acute systolic/diastolicHF:   Net negative 1.85 liters this admission.  I/O seem to be incomplete.    She does have crackles at the bases but with good oxygenation and creat elevation, no diuresis.  Push good pulmonary toilet and get her up and ambulating and in the chair.  I will defer these orders to the primary team  HTN:  Added hydralazine this admission.   BP is controlled on current meds.      ICD/PPM [employer]:  Device placed 2013 and last check 2018.  We had it checked this month and has 18 month battery life.  No ICD shocks .  Needs to be reestablished with EP follow up after  discharge.    NICM.  Cath  2016. EF at that time 25-30% it had improved. EF now stable at 40-45% with G3DD   CKD 3a:  Creat now increased after a dose of IV Lasix.  Holding any further diuresis.     For questions or updates, please contact [place] Please consult [email or web address] for contact info under Cardiology/STEMI.   Signed, Minus Breeding, MD  02/05/2020, 12:31 PM

## 2020-02-05 NOTE — Care Management Obs Status (Signed)
[place] NOTIFICATION   Patient Details  Name: [name] MRN: [id number] Date of Birth: 10/05/1934   Medicare Observation Status Notification Given:  Yes    MahabirJuliann Pulse, RN 02/05/2020, 11:33 AM

## 2020-02-05 NOTE — Progress Notes (Signed)
At 0910, pt sitting up in chair eating breakfast. Called to patient's room by patient who states "I feel faint".  Pt became unresponsive, with pupils fixed. A & O x4 prior. VSS. Pt noted to be diaphoretic. CBG 130. Pt vomited large amount of undigested food. Oral suction completed. O2 saturations remained 97-98% on RA. During time of unresponsiveness, pt noted to void via purewick and to have large type 4 stool in colostomy bag. Dr. Pietro Cassis and RRT RN made aware. Patient became more alert and assisted back to bed with 2 RN's for contact/steadying assist. VSS upon recheck. IV fluids started. A&O x 4 at this time. Grips weak but equal bilaterally. Pupils sluggish but equal bilaterally. Pt able to raise eyebrows bilaterally, able to stick out tongue and no droop noted when smiling. MD at bedside to assess patient. Will continue to monitor.

## 2020-02-05 NOTE — NC FL2 (Signed)
[place] MEDICAID FL2 LEVEL OF CARE SCREENING TOOL     IDENTIFICATION  Patient Name: [name] Birthdate: 03-04-1934 Sex: female Admission Date (Current Location): 02/03/2020  [facility] and [place] Number:  Herbalist and Address:  [facility],  [place] [address] [place]      Provider Number: [id number]  Attending Physician Name and Address:  Terrilee Croak, MD  Relative Name and Phone Number:  [name] son [phone]    Current Level of Care: Hospital Recommended Level of Care: [place] Prior Approval Number:    Date Approved/Denied:   PASRR Number:    Discharge Plan: Other (Comment)(ALF-[facility])    Current Diagnoses: Patient Active Problem List   Diagnosis Date Noted  . CHF exacerbation ([place]) 02/03/2020  . Chronic combined systolic and diastolic CHF (congestive heart failure) ([place]) 04/18/2019  . Personal history of gout 08/28/2018  . Recurrent parastomal hernia at LLQ colostomy 08/21/2018  . SBO (small bowel obstruction) ([place]) 08/21/2018  . Chest pain 10/28/2017  . Aspiration of liquid 10/28/2017  . Bilateral lower extremity edema   . Pneumonia 04/22/2017  . Altered mental status   . Hypotension 02/16/2017  . Syncope 02/02/2016  . Faintness   . Essential hypertension 11/16/2015  . Hypothyroidism 11/16/2015  . Acute on chronic systolic CHF (congestive heart failure) ([place]) 11/15/2015  . Hypertensive heart disease 10/04/2015  . Elevated troponin 10/04/2015  . Acute respiratory failure ([place])   . Acute systolic congestive heart failure ([place])   . NICM (nonischemic cardiomyopathy) ([place]) 08/11/2015  . Normal coronary arteries 08/11/2015  . Dyslipidemia 07/27/2015  . CKD (chronic kidney disease) stage 3, GFR 30-59 ml/min 07/27/2015  . Anemia 07/27/2015  . Benign essential HTN 07/27/2015  . FUO (fever of unknown origin) 07/27/2015  . IDA (iron deficiency anemia) 07/27/2015  . Chronic systolic CHF  (congestive heart failure) ([place]) 07/27/2015  . Rectal cancer ypTypN0 (0/13) s/p chemoXRT/ APR/colostomy 2006 07/27/2015  . Sepsis ([place]) 07/24/2015  . Acute encephalopathy 07/24/2015  . Hypokalemia 07/24/2015  . Dementia ([place]) 07/23/2015  . COPD (chronic obstructive pulmonary disease) ([place]) 04/25/2015  . ICD in place 04/14/2015  . Colostomy in place [facility]) 03/01/2015  . Pacemaker 11/07/2012  . Anxiety 10/04/2011    Orientation RESPIRATION BLADDER Height & Weight     Self  Normal Continent Weight: 88.8 kg Height:  5\' 6"  (167.6 cm)  BEHAVIORAL SYMPTOMS/MOOD NEUROLOGICAL BOWEL NUTRITION STATUS      Continent Diet(Heart healthy thin)  AMBULATORY STATUS COMMUNICATION OF NEEDS Skin   Limited Assist Verbally Normal                       Personal Care Assistance Level of Assistance  Bathing, Feeding, Dressing Bathing Assistance: Limited assistance Feeding assistance: Limited assistance Dressing Assistance: Limited assistance     Functional Limitations Info  Sight, Hearing, Speech Sight Info: Impaired(eyeglasses) Hearing Info: Adequate Speech Info: Impaired(bilateral dentures)    SPECIAL CARE FACTORS FREQUENCY  PT (By licensed PT), OT (By licensed OT)     PT Frequency: 3x week OT Frequency: 3x week            Contractures Contractures Info: Not present    Additional Factors Info  Code Status, Allergies Code Status Info: Full code Allergies Info: NKA           Current Medications (02/05/2020):  This is the current hospital active medication list Current Facility-Administered Medications  Medication Dose Route Frequency  Provider Last Rate Last Admin  . 0.9 %  sodium chloride infusion   Intravenous Continuous Terrilee Croak, MD 50 mL/hr at 02/05/20 0928 New Bag at 02/05/20 [id number]  . acetaminophen (TYLENOL) tablet 650 mg  650 mg Oral Q6H PRN Aline August, MD   650 mg at 02/04/20 1710   Or  . acetaminophen (TYLENOL) suppository 650 mg  650 mg Rectal Q6H PRN Aline August, MD      . alum & mag hydroxide-simeth (MAALOX/MYLANTA) 200-200-20 MG/5ML suspension 30 mL  30 mL Oral PRN Starla Link, Kshitiz, MD      . aspirin EC tablet 81 mg  81 mg Oral Daily Starla Link, Kshitiz, MD   81 mg at 02/05/20 0821  . atorvastatin (LIPITOR) tablet 20 mg  20 mg Oral QHS Aline August, MD   20 mg at 02/04/20 2104  . carvedilol (COREG) tablet 6.25 mg  6.25 mg Oral BID WC Starla Link, Kshitiz, MD   6.25 mg at 02/05/20 0820  . enoxaparin (LOVENOX) injection 30 mg  30 mg Subcutaneous Q24H Pham, Anh P, RPH   30 mg at 02/05/20 [id number]  . famotidine (PEPCID) tablet 20 mg  20 mg Oral Daily Dahal, Marlowe Aschoff, MD   20 mg at 02/05/20 0819  . guaiFENesin (ROBITUSSIN) 100 MG/5ML solution 200 mg  200 mg Oral Q6H PRN Aline August, MD   200 mg at 02/05/20 0209  . hydrALAZINE (APRESOLINE) tablet 25 mg  25 mg Oral Q8H PRN Alekh, Kshitiz, MD      . hydrALAZINE (APRESOLINE) tablet 25 mg  25 mg Oral Q8H Minus Breeding, MD   25 mg at 02/05/20 0504  . isosorbide mononitrate (IMDUR) 24 hr tablet 60 mg  60 mg Oral Daily Minus Breeding, MD   60 mg at 02/05/20 0819  . levothyroxine (SYNTHROID) tablet 50 mcg  50 mcg Oral QAC breakfast Aline August, MD   50 mcg at 02/05/20 0504  . loratadine (CLARITIN) tablet 10 mg  10 mg Oral Daily Aline August, MD   10 mg at 02/05/20 0820  . magnesium hydroxide (MILK OF MAGNESIA) suspension 30 mL  30 mL Oral QHS PRN Starla Link, Kshitiz, MD      . ondansetron (ZOFRAN) tablet 4 mg  4 mg Oral Q6H PRN Alekh, Kshitiz, MD       Or  . ondansetron (ZOFRAN) injection 4 mg  4 mg Intravenous Q6H PRN Alekh, Kshitiz, MD      . polyethylene glycol (MIRALAX / GLYCOLAX) packet 17 g  17 g Oral Daily Starla Link, Kshitiz, MD   17 g at 02/05/20 0820  . polyvinyl alcohol (LIQUIFILM TEARS) 1.4 % ophthalmic solution 1 drop  1 drop Both Eyes TID Aline August, MD   1 drop at 02/05/20 [id number]  . sodium chloride (OCEAN) 0.65 % nasal spray 1 spray  1 spray Each Nare PRN Aline August, MD         Discharge  Medications: Please see discharge summary for a list of discharge medications.  Relevant Imaging Results:  Relevant Lab Results:   Additional Information ssn: [id number]  Dessa Phi, RN

## 2020-02-05 NOTE — Progress Notes (Signed)
PROGRESS NOTE  [name]  DOB: 1934-07-30  PCP: [name], [place] [id number]  DOA: 02/03/2020  LOS: 0 days   Chief Complaint  Patient presents with  . Shortness of Breath   Brief narrative: [name] is a 86 y.o. female with PMH of nonischemic cardiomyopathy with ICD, chronic combined systolic and diastolic CHF with EF 40 to 45% from 2018, moderate pulmonary hypertension, normal coronaries, HTN, HLD, CKD 3, dementia, hypothyroidism, rectal cancer sp resection and colostomy, history of open embolism who lives in an assisted living facility. Patient presented to the ED on 02/03/2020 with complaint of worsening shortness of breath, cough, wheezing for 1 to 2 days. She has had 2 doses of her Covid vaccine.  In the ED, she was found to be tachypneic. Chest x-ray showed cardiomegaly with prominent bilateral interstitial markings suspicious of pulmonary edema.   BNP was elevated to 968. Patient was admitted to hospitalist service for further evaluation and management.   Subjective: Patient was seen and examined this morning.   While taking her breakfast sitting in a chair this morning, patient felt like 'fainting', she had extreme nausea, threw up her breakfast, had significant sweating and became drowsy.   By the time I attended, patient was up in the bed, slowly improving mental status, able to answer questions appropriately.  Denies any chest pain, palpitation prior to the episode.   Telemetry monitoring shows persistent V paced rhythm.  Per RN who witnessed event, she did not see any convulsions, patient had urinary incontinence at that time.   In next several minutes, patient's mental status is back to normal. Chart reviewed.  Patient's vital signs had remained stable earlier. Lab work from this morning showed creatinine elevated to 2.27 from 1.36 yesterday.  Assessment/Plan: Acute decompensation of chronic combined systolic and diastolic CHF Essential hypertension -Presented  with worsening shortness of breath, wheezing. -Chest x-ray with pulmonary edema, BNP elevated. -Currently on Coreg, nitrate, hydralazine. Lasix on hold. -Continue fluid restriction, daily weight, strict intake and output monitoring. -Cardiology following. -Not on supplemental oxygen.  Not on antibiotics.  AKI on CKD 3 a -Creatinine was normal at presentation at 1.07.  -Patient was given Lasix IV after which her creatinine is trending up, 1.36 yesterday, 2.27 today.  Last dose of Lasix was yesterday morning.  -1.7 L negative balance since admission. -Start on normal saline at 50 mill per hour. -Repeat creatinine tomorrow.  Nonischemic cardiomyopathy ICD in place -Hx NICM with cath 2016. EF at that time 25-30% with subsequent improvement, now EF 40 to 45% with grade 3 diastolic dysfunction. -ICD/PPM [employer]- pacemaker interrogation this morning.   -Normal coronaries on last cath.  Remains on aspirin and statin  Vasovagal episode this morning -While taking her breakfast sitting in a chair this morning, patient felt like 'fainting', she had extreme nausea, threw up her breakfast, had significant sweating and became drowsy.   By the time I attended, patient was up in the bed, slowly improving mental status, able to answer questions appropriately.  Denies any chest pain, palpitation prior to the episode.   Telemetry monitoring shows persistent V paced rhythm.  Per RN who witnessed the event, she did not see any convulsions, patient had urinary incontinence at that time.   In next several minutes, patient's mental status is back to normal. -Patient seems to have vasovagal syncopal episode, probably precipitated by dehydration.  Anemia of chronic disease -Hemoglobin stable.  Continue iron supplement.  Hypothyroidism -Continue Synthroid  Dementia -Continue supportive  care.  COPD -Currently stable.  Continue inhalers.   History of rectal cancer status post colostomy -Continue  colostomy care.  Mobility: PT recommended home with PT. Code Status:  Full code per chart  DVT prophylaxis:  Lovenox Antimicrobials:  None Fluid: None Diet: Cardiac diet  Consultants: Cardiology Family Communication:  None at bedside  Status is: Observation Patient has elevated creatinine, 1 episode of syncope today.  She needs to be on IV fluids, creatinine repeated tomorrow.  She is not ready for discharge today.  We will switch to inpatient status.   Dispo: The patient is from: ALF              Anticipated d/c is to: ALF              Anticipated d/c date is: 2 days              Patient currently is not medically stable to d/c.  Antimicrobials: Anti-infectives (From admission, onward)   None        Code Status: Full Code   Diet Order            Diet Heart Room service appropriate? Yes; Fluid consistency: Thin; Fluid restriction: 1200 mL Fluid  Diet effective now              Infusions:  . sodium chloride 50 mL/hr at 02/05/20 0928    Scheduled Meds: . aspirin EC  81 mg Oral Daily  . atorvastatin  20 mg Oral QHS  . carvedilol  6.25 mg Oral BID WC  . enoxaparin (LOVENOX) injection  30 mg Subcutaneous Q24H  . famotidine  20 mg Oral Daily  . hydrALAZINE  25 mg Oral Q8H  . isosorbide mononitrate  60 mg Oral Daily  . levothyroxine  50 mcg Oral QAC breakfast  . loratadine  10 mg Oral Daily  . polyethylene glycol  17 g Oral Daily  . polyvinyl alcohol  1 drop Both Eyes TID    PRN meds: acetaminophen **OR** acetaminophen, alum & mag hydroxide-simeth, guaiFENesin, hydrALAZINE, magnesium hydroxide, ondansetron **OR** ondansetron (ZOFRAN) IV, sodium chloride   Objective: Vitals:   02/05/20 0930 02/05/20 1332  BP: (!) 125/52 (!) 124/55  Pulse: 91 89  Resp:  16  Temp:  97.8 F (36.6 C)  SpO2: 97% 97%    Intake/Output Summary (Last 24 hours) at 02/05/2020 1355 Last data filed at 02/05/2020 1330 Gross per 24 hour  Intake 205.54 ml  Output 525 ml  Net -319.46 ml    Filed Weights   02/03/20 0900 02/05/20 0454  Weight: 85.7 kg 88.8 kg   Weight change: 3.07 kg Body mass index is 31.6 kg/m.   Physical Exam: General exam: Appears calm and comfortable  Skin: No rashes, lesions or ulcers. HEENT: Atraumatic, normocephalic, supple neck, no obvious bleeding Lungs: Clear to auscultation bilaterally CVS: Regular rate, rhythm, no murmur GI/Abd soft, nontender, nondistended, bowel sound present, colostomy bag with yellow stool CNS: Alert, awake, demented at baseline Psychiatry: Mood appropriate Extremities: Improving bilateral pedal edema  Data Review: I have personally reviewed the laboratory data and studies available.  Recent Labs  Lab 02/03/20 0701 02/04/20 0611  WBC 7.5 8.0  NEUTROABS 4.8  --   HGB 10.4* 10.7*  HCT 33.8* 34.6*  MCV 99.4 96.1  PLT 192 211   Recent Labs  Lab 02/03/20 0701 02/04/20 0611 02/05/20 0505  NA 141 140 139  K 4.0 3.7 3.8  CL 103 100 99  CO2 30 29  27  GLUCOSE 91 86 78  BUN 17 25* 41*  CREATININE 1.07* 1.36* 2.27*  CALCIUM 10.1 9.8 9.4   Signed, Terrilee Croak, MD [facility] Pager: [phone] (Secure Chat preferred). 02/05/2020

## 2020-02-06 ENCOUNTER — Inpatient Hospital Stay (HOSPITAL_COMMUNITY): Payer: Medicare HMO

## 2020-02-06 DIAGNOSIS — I509 Heart failure, unspecified: Secondary | ICD-10-CM

## 2020-02-06 LAB — BASIC METABOLIC PANEL
Anion gap: 9 (ref 5–15)
BUN: 45 mg/dL — ABNORMAL HIGH (ref 8–23)
CO2: 26 mmol/L (ref 22–32)
Calcium: 8.5 mg/dL — ABNORMAL LOW (ref 8.9–10.3)
Chloride: 102 mmol/L (ref 98–111)
Creatinine, Ser: 1.8 mg/dL — ABNORMAL HIGH (ref 0.44–1.00)
GFR calc Af Amer: 30 mL/min — ABNORMAL LOW (ref >60)
GFR calc non Af Amer: 26 mL/min — ABNORMAL LOW (ref >60)
Glucose, Bld: 84 mg/dL (ref 70–99)
Potassium: 3.9 mmol/L (ref 3.5–5.1)
Sodium: 137 mmol/L (ref 135–145)

## 2020-02-06 MED ORDER — GUAIFENESIN ER 600 MG PO TB12
1200.0000 mg | ORAL_TABLET | Freq: Two times a day (BID) | ORAL | Status: DC
  Administered 2020-02-06 – 2020-02-09 (×7): 1200 mg via ORAL
  Filled 2020-02-06 (×7): qty 2

## 2020-02-06 NOTE — Progress Notes (Signed)
Physical Therapy Treatment Patient Details Name: [name] MRN: [id number] DOB: [birth year] Today's Date: 02/06/2020    History of Present Illness [name] is a 86 y.o. female with a history of CHF last EF 40-45% in 2018, NICM with ICD,COPD, DM, hypertension, hyperlipidemia, hypothyroidism, pulmonary embolism not on anticoagulation, dementia, and colon cancer with colostomy who presents to the ED 02/03/20 from [facility] for evaluation of dyspnea,On chest x-ray patient has evidence of fluid overload and BNP is elevated consistent with CHF.    PT Comments    Pt was OOB in recliner.  "I feel wet".  General transfer comment: pt has a difficutl time self rising from recliner chair.  Attempted x 3 then therapist offered 50% assist.  Assisted from recliner to [facility] "I feel wet".  Assisted with hygiene as pt was unable to self perform at a safe standing stance.  Assisted to EOB with increased tiem and VC's on safety with turn completion prior to sit as pt went to sit too early due to fatigue.General Gait Details: limited distance due to fatigue and c/o B LE "heaviness".  Pt stated prior to admit she was able to walk to the dinning room using her rollator.General bed mobility comments: assisted with B LE up onto bed as they feel "heavy" stated pt.  Pt concerned about returning to [facility].  "I'm going to need more help".  Follow Up Recommendations  Home health PT;Supervision/Assistance - 24 hour;Supervision for mobility/OOB(per chart review pt plans to return to her [facility] but pt stated "they only help with TEDS and MEDS.  Pt will need initial assist with most ADL's and gait.)  If [facility] unable to offer increased care pt may need SNF   Equipment Recommendations  None recommended by PT    Recommendations for Other Services       Precautions / Restrictions Precautions Precautions: ICD/Pacemaker;Fall Restrictions Weight Bearing Restrictions: No    Mobility  Bed  Mobility Overal bed mobility: Needs Assistance Bed Mobility: Sit to Supine     Supine to sit: HOB elevated;Min assist(increaseed time, assistance to scoot to edge of bed.) Sit to supine: Mod assist   General bed mobility comments: assisted with B LE up onto bed as they feel "heavy" stated pt  Transfers Overall transfer level: Needs assistance Equipment used: 4-wheeled walker Transfers: Sit to/from [employer] Transfers Sit to Stand: Mod assist Stand pivot transfers: Min assist       General transfer comment: pt has a difficutl time self rising from recliner chair.  Attempted x 3 then therapist offered 50% assist.  Assisted from recliner to [facility] "I feel wet".  Assisted with hygiene as pt was unable to self perform at a safe standing stance.  Assisted to EOB with increased tiem and VC's on safety with turn completion prior to sit as pt went to sit too early due to fatigue.  Ambulation/Gait Ambulation/Gait assistance: Min guard;Min assist Gait Distance (Feet): 12 Feet Assistive device: 4-wheeled walker Gait Pattern/deviations: Step-to pattern;Decreased step length - right;Decreased step length - left;Trunk flexed Gait velocity: decreased   General Gait Details: limited distance due to fatigue and c/o B LE "heaviness".  Pt stated prior to admit she was able to walk to the dinning room using her rollator.   Stairs             Wheelchair Mobility    Modified Rankin (Stroke Patients Only)       Balance   Sitting-balance support: Feet supported;Bilateral upper  extremity supported Sitting balance-Leahy Scale: Good     Standing balance support: Bilateral upper extremity supported;During functional activity Standing balance-Leahy Scale: Poor                              Cognition Arousal/Alertness: Awake/alert Behavior During Therapy: WFL for tasks assessed/performed Overall Cognitive Status: History of cognitive impairments - at baseline                                  General Comments: AxO x 4 pleasant stating "I'm not as strong as I was"      Exercises      General Comments        Pertinent Vitals/Pain Pain Assessment: No/denies pain Faces Pain Scale: Hurts a little bit Pain Location: my legs feel heavy Pain Intervention(s): Monitored during session    Home Living Family/patient expects to be discharged to:: Assisted living             Home Equipment: Walker - 4 wheels;Shower seat;Grab bars - tub/shower;Grab bars - toilet Additional Comments: pt reports that she, walks with rollator, toilets self    Prior Function Level of Independence: Needs assistance  Gait / Transfers Assistance Needed: pt reports walking with rollator ADL's / Homemaking Assistance Needed: Patient reports she can perform dressing and toileting. has assistance for getting into shower and bathing. Reports independence with colostomy care.     PT Goals (current goals can now be found in the care plan section) Acute Rehab PT Goals Patient Stated Goal: go back to facility Progress towards PT goals: Progressing toward goals    Frequency    Min 3X/week      PT Plan Current plan remains appropriate    Co-evaluation              AM-PAC PT "6 Clicks" Mobility   Outcome Measure  Help needed turning from your back to your side while in a flat bed without using bedrails?: A Lot Help needed moving from lying on your back to sitting on the side of a flat bed without using bedrails?: A Lot Help needed moving to and from a bed to a chair (including a wheelchair)?: A Lot Help needed standing up from a chair using your arms (e.g., wheelchair or bedside chair)?: A Lot Help needed to walk in hospital room?: A Lot Help needed climbing 3-5 steps with a railing? : Total 6 Click Score: 11    End of Session Equipment Utilized During Treatment: Gait belt Activity Tolerance: Patient limited by fatigue Patient left: in bed;with call  bell/phone within reach   PT Visit Diagnosis: Unsteadiness on feet (R26.81)     Time: [id number] PT Time Calculation (min) (ACUTE ONLY): 25 min  Charges:  $Gait Training: 8-22 mins $Therapeutic Activity: 8-22 mins                     Rica Koyanagi  PTA Acute  Rehabilitation Services Pager      [phone] Office      [phone]

## 2020-02-06 NOTE — Progress Notes (Signed)
PROGRESS NOTE  [name]  DOB: 12/11/1933  PCP: [name], [place] [id number]  DOA: 02/03/2020  LOS: 1 day   Chief Complaint  Patient presents with  . Shortness of Breath   Brief narrative: [name] is a 86 y.o. female with PMH of nonischemic cardiomyopathy with ICD, chronic combined systolic and diastolic CHF with EF 40 to 45% from 2018, moderate pulmonary hypertension, normal coronaries, HTN, HLD, CKD 3, dementia, hypothyroidism, rectal cancer sp resection and colostomy, history of open embolism who lives in an assisted living facility. Patient presented to the ED on 02/03/2020 with complaint of worsening shortness of breath, cough, wheezing for 1 to 2 days. She has had 2 doses of her Covid vaccine.  In the ED, she was found to be tachypneic. Chest x-ray showed cardiomegaly with prominent bilateral interstitial markings suspicious of pulmonary edema.   BNP was elevated to 968. Patient was admitted to hospitalist service for further evaluation and management.   Subjective: Patient was seen and examined this morning.   Smiling.  Not in distress today. Given IV hydration overnight because of episode of syncope yesterday. Not on supplemental oxygen. Labs this morning shows improving creatinine, not yet back to baseline.  Assessment/Plan: Acute decompensation of chronic combined systolic and diastolic CHF Essential hypertension -Presented with worsening shortness of breath, wheezing. -Chest x-ray on admission showed pulmonary edema, BNP elevated. -IV Lasix was started but patient became dehydrated with just 1 to 2 days and hence it was held. -Currently on Coreg, nitrate, hydralazine.  Dose adjusted by cardiology. -Echo 4/27 showed overall improvement in EF to 40 to 45%. -Continue fluid restriction, daily weight, strict intake and output monitoring. -Not on supplemental oxygen.  Not on antibiotics.  AKI on CKD 3 a -Creatinine was normal at presentation at 1.07.    -Patient was given Lasix IV after which her creatinine started to rise up to a peak of 2.27.   -With IV hydration, creatinine is better today at 1.8.  Continue to monitor.   -Stop IV fluid because of risk of CHF exacerbation. -Repeat creatinine tomorrow.  If creatinine improving, patient may be at an optimized state of fluid balance and ready for discharge.  Nonischemic cardiomyopathy ICD in place -Hx NICM with cath 2016. EF at that time 25-30% with subsequent improvement, now EF 40 to 45% with grade 3 diastolic dysfunction. -ICD/PPM [employer]- pacemaker interrogation this morning.   -Normal coronaries on last cath.  Remains on aspirin and statin  Anemia of chronic disease -Hemoglobin stable.  Continue iron supplement.  Hypothyroidism -Continue Synthroid  Dementia -Continue supportive care.  COPD -Currently stable. Continue inhalers.   History of rectal cancer status post colostomy -Continue colostomy care.  Mobility: PT recommended home with PT. Code Status:  Full code per chart  DVT prophylaxis:  Lovenox Antimicrobials:  None Fluid: None Diet: Cardiac diet  Consultants: Cardiology Family Communication: : Updated patient's on 4/29  Status is: Inpatient  Remains inpatient appropriate because:IV treatments appropriate due to intensity of illness or inability to take PO and Inpatient level of care appropriate due to severity of illness   Dispo: The patient is from: ALF              Anticipated d/c is to: ALF              Anticipated d/c date is: 1 day              Patient currently is not medically stable to d/c.  Antimicrobials: Anti-infectives (From admission, onward)   None        Code Status: Full Code   Diet Order            Diet Heart Room service appropriate? Yes; Fluid consistency: Thin; Fluid restriction: 1200 mL Fluid  Diet effective now              Infusions:    Scheduled Meds: . aspirin EC  81 mg Oral Daily  . atorvastatin  20  mg Oral QHS  . carvedilol  6.25 mg Oral BID WC  . enoxaparin (LOVENOX) injection  30 mg Subcutaneous Q24H  . famotidine  20 mg Oral Daily  . guaiFENesin  1,200 mg Oral BID  . hydrALAZINE  25 mg Oral Q8H  . isosorbide mononitrate  60 mg Oral Daily  . levothyroxine  50 mcg Oral QAC breakfast  . loratadine  10 mg Oral Daily  . polyethylene glycol  17 g Oral Daily  . polyvinyl alcohol  1 drop Both Eyes TID    PRN meds: acetaminophen **OR** acetaminophen, alum & mag hydroxide-simeth, guaiFENesin, hydrALAZINE, magnesium hydroxide, ondansetron **OR** ondansetron (ZOFRAN) IV, sodium chloride   Objective: Vitals:   02/06/20 0440 02/06/20 1245  BP: (!) 109/48 140/80  Pulse: 86 77  Resp: 18   Temp: 98.1 F (36.7 C) 98.9 F (37.2 C)  SpO2: 96% 99%    Intake/Output Summary (Last 24 hours) at 02/06/2020 1721 Last data filed at 02/06/2020 1600 Gross per 24 hour  Intake 1339.97 ml  Output 725 ml  Net 614.97 ml   Filed Weights   02/03/20 0900 02/05/20 0454 02/06/20 0500  Weight: 85.7 kg 88.8 kg 89.2 kg   Weight change: 0.4 kg Body mass index is 31.74 kg/m.   Physical Exam: General exam: Appears calm and comfortable  Skin: No rashes, lesions or ulcers. HEENT: Atraumatic, normocephalic, supple neck, no obvious bleeding Lungs: Clear to auscultation bilaterally CVS: Regular rate, rhythm, no murmur GI/Abd soft, nontender, nondistended, bowel sound present, colostomy bag with yellow stool CNS: Alert, awake, demented at baseline Psychiatry: Mood appropriate Extremities: Improving bilateral pedal edema  Data Review: I have personally reviewed the laboratory data and studies available.  Recent Labs  Lab 02/03/20 0701 02/04/20 0611  WBC 7.5 8.0  NEUTROABS 4.8  --   HGB 10.4* 10.7*  HCT 33.8* 34.6*  MCV 99.4 96.1  PLT 192 211   Recent Labs  Lab 02/03/20 0701 02/04/20 0611 02/05/20 0505 02/06/20 0445  NA 141 140 139 137  K 4.0 3.7 3.8 3.9  CL 103 100 99 102  CO2 30 29 27  26   GLUCOSE 91 86 78 84  BUN 17 25* 41* 45*  CREATININE 1.07* 1.36* 2.27* 1.80*  CALCIUM 10.1 9.8 9.4 8.5*   Signed, Terrilee Croak, MD [facility] Pager: [phone] (Secure Chat preferred). 02/06/2020

## 2020-02-06 NOTE — Evaluation (Addendum)
Occupational Therapy Evaluation Patient Details Name: [name] MRN: [id number] DOB: May 28, 1934 Today's Date: 02/06/2020    History of Present Illness [name] is a 86 y.o. female with a history of CHF last EF 40-45% in 2018, NICM with ICD,COPD, DM, hypertension, hyperlipidemia, hypothyroidism, pulmonary embolism not on anticoagulation, dementia, and colon cancer with colostomy who presents to the ED 02/03/20 from [facility] for evaluation of dyspnea,On chest x-ray patient has evidence of fluid overload and BNP is elevated consistent with CHF.   Clinical Impression   [name] is an 86 year old woman admitted to hospital with CHF exacerbation and presents with generalized weakness, decreased activity tolerance, and impaired balance resulting in difficulty performing baseline ADLs and mobility. Patient lives at [facility], reports ambulating with rollator and independence with toileting and dressing. Patient will benefit from skilled OT services to improve deficits in order to return to PLOF. At this time patient shoulder be able to return to ALF with assistance from staff and [facility] OT/PT services.     Follow Up Recommendations  Home health OT    Equipment Recommendations  None recommended by OT    Recommendations for Other Services       Precautions / Restrictions Precautions Precautions: ICD/Pacemaker;Fall Restrictions Weight Bearing Restrictions: No      Mobility Bed Mobility Overal bed mobility: Needs Assistance Bed Mobility: Supine to Sit     Supine to sit: HOB elevated;Min assist(increaseed time, assistance to scoot to edge of bed.)        Transfers   Equipment used: Rolling walker (2 wheeled) Transfers: Sit to/from Stand Sit to Stand: Min assist         General transfer comment: Min assist to stand from elevated bed. Min assist to take steps with RW to recliner.    Balance   Sitting-balance support: Feet supported;Bilateral upper extremity  supported Sitting balance-Leahy Scale: Good     Standing balance support: Bilateral upper extremity supported;During functional activity Standing balance-Leahy Scale: Poor                             ADL either performed or assessed with clinical judgement   ADL Overall ADL's : Needs assistance/impaired Eating/Feeding: Independent   Grooming: Set up   Upper Body Bathing: Set up   Lower Body Bathing: Moderate assistance   Upper Body Dressing : Set up   Lower Body Dressing: Maximal assistance Lower Body Dressing Details (indicate cue type and reason): Needed assistance to donn socks. Toilet Transfer: Minimal assistance;RW;BSC   Toileting- Water quality scientist and Hygiene: Moderate assistance       Functional mobility during ADLs: Minimal assistance;Rolling walker General ADL Comments: Patient limited for LB dressing and bathing today due to colostomy bag being too full - unable to bend to feet.     Vision Baseline Vision/History: Wears glasses Vision Assessment?: No apparent visual deficits     Perception     Praxis      Pertinent Vitals/Pain Pain Assessment: Faces Faces Pain Scale: Hurts a little bit Pain Location: Left ankle Pain Intervention(s): Monitored during session     Hand Dominance Right   Extremity/Trunk Assessment Upper Extremity Assessment Upper Extremity Assessment: Generalized weakness   Lower Extremity Assessment Lower Extremity Assessment: Defer to PT evaluation   Cervical / Trunk Assessment Cervical / Trunk Assessment: Normal   Communication Communication Communication: No difficulties   Cognition Arousal/Alertness: Awake/alert   Overall Cognitive Status: History of  cognitive impairments - at baseline                                     General Comments       Exercises     Shoulder Instructions      Home Living Family/patient expects to be discharged to:: Assisted living                              Home Equipment: Walker - 4 wheels;Shower seat;Grab bars - tub/shower;Grab bars - toilet   Additional Comments: pt reports that she, walks with rollator, toilets self      Prior Functioning/Environment Level of Independence: Needs assistance  Gait / Transfers Assistance Needed: pt reports walking with rollator ADL's / Homemaking Assistance Needed: Patient reports she can perform dressing and toileting. has assistance for getting into shower and bathing. Reports independence with colostomy care.            OT Problem List: Decreased strength;Decreased activity tolerance;Impaired balance (sitting and/or standing);Pain      OT Treatment/Interventions: Self-care/ADL training;Therapeutic exercise;Therapeutic activities;Patient/family education;Balance training    OT Goals(Current goals can be found in the care plan section) Acute Rehab OT Goals Patient Stated Goal: go back to facility OT Goal Formulation: With patient Time For Goal Achievement: 02/19/20 Potential to Achieve Goals: Fair ADL Goals Pt Will Perform Upper Body Bathing: with modified independence Pt Will Perform Lower Body Bathing: with set-up Pt Will Perform Lower Body Dressing: with set-up;sit to/from stand;with adaptive equipment Pt Will Transfer to Toilet: with min guard assist;ambulating;regular height toilet Pt Will Perform Toileting - Clothing Manipulation and hygiene: with min guard assist;sit to/from stand Pt/caregiver will Perform Home Exercise Program: Both right and left upper extremity;With written HEP provided Additional ADL Goal #1: Patient will perform sit to stand with supervision using RW/rollator  OT Frequency: Min 2X/week   Barriers to D/C:            Co-evaluation              AM-PAC OT "6 Clicks" Daily Activity     Outcome Measure Help from another person eating meals?: None Help from another person taking care of personal grooming?: A Little Help from another person  toileting, which includes using toliet, bedpan, or urinal?: A Lot Help from another person bathing (including washing, rinsing, drying)?: A Lot Help from another person to put on and taking off regular upper body clothing?: A Little Help from another person to put on and taking off regular lower body clothing?: A Lot 6 Click Score: 16   End of Session Equipment Utilized During Treatment: Gait belt;Rolling walker  Activity Tolerance: Patient tolerated treatment well Patient left: in chair;with call bell/phone within reach(RN reports no need for chair alarm)  OT Visit Diagnosis: Unsteadiness on feet (R26.81);Muscle weakness (generalized) (M62.81);History of falling (Z91.81);Pain Pain - Right/Left: Left Pain - part of body: Ankle and joints of foot                Time: [id number] OT Time Calculation (min): 27 min Charges:  OT General Charges $OT Visit: 1 Visit OT Evaluation $OT Eval Low Complexity: 1 Low  Elye Harmsen, OTR/L [place]  Office [phone]   Lenward Chancellor 02/06/2020, 1:15 PM

## 2020-02-06 NOTE — Progress Notes (Signed)
Progress Note  Patient Name: [name] Date of Encounter: 02/06/2020  Primary Cardiologist:   No primary care provider on file.   Subjective   Denies SOB.  Mild cough.  Events of yesterday noted with near syncope.  No further events.  Inpatient Medications    Scheduled Meds: . aspirin EC  81 mg Oral Daily  . atorvastatin  20 mg Oral QHS  . carvedilol  6.25 mg Oral BID WC  . enoxaparin (LOVENOX) injection  30 mg Subcutaneous Q24H  . famotidine  20 mg Oral Daily  . guaiFENesin  1,200 mg Oral BID  . hydrALAZINE  25 mg Oral Q8H  . isosorbide mononitrate  60 mg Oral Daily  . levothyroxine  50 mcg Oral QAC breakfast  . loratadine  10 mg Oral Daily  . polyethylene glycol  17 g Oral Daily  . polyvinyl alcohol  1 drop Both Eyes TID   Continuous Infusions:  PRN Meds: acetaminophen **OR** acetaminophen, alum & mag hydroxide-simeth, guaiFENesin, hydrALAZINE, magnesium hydroxide, ondansetron **OR** ondansetron (ZOFRAN) IV, sodium chloride   Vital Signs    Vitals:   02/05/20 1332 02/05/20 2056 02/06/20 0440 02/06/20 0500  BP: (!) 124/55 (!) 109/47 (!) 109/48   Pulse: 89 95 86   Resp: 16 (!) 22 18   Temp: 97.8 F (36.6 C) 98.7 F (37.1 C) 98.1 F (36.7 C)   TempSrc: Oral Oral Oral   SpO2: 97% 98% 96%   Weight:    89.2 kg  Height:        Intake/Output Summary (Last 24 hours) at 02/06/2020 1231 Last data filed at 02/06/2020 0600 Gross per 24 hour  Intake 1655.65 ml  Output 200 ml  Net 1455.65 ml   Filed Weights   02/03/20 0900 02/05/20 0454 02/06/20 0500  Weight: 85.7 kg 88.8 kg 89.2 kg    Telemetry    NSR with V pacing Personally Reviewed  ECG    NA - Personally Reviewed  Physical Exam   GEN: No  acute distress.   Neck: No  JVD Cardiac: RRR, soft systolic murmur, no diastolic murmurs, rubs, or gallops.  Respiratory:    Diffuse fine crackles.  GI: Soft, nontender, non-distended, normal bowel sounds  MS:  No edema; No deformity. Neuro:   Nonfocal    Psych: Oriented and appropriate    Labs    Chemistry Recent Labs  Lab 02/03/20 0701 02/03/20 0701 02/04/20 0611 02/05/20 0505 02/06/20 0445  NA 141   < > 140 139 137  K 4.0   < > 3.7 3.8 3.9  CL 103   < > 100 99 102  CO2 30   < > 29 27 26   GLUCOSE 91   < > 86 78 84  BUN 17   < > 25* 41* 45*  CREATININE 1.07*   < > 1.36* 2.27* 1.80*  CALCIUM 10.1   < > 9.8 9.4 8.5*  PROT 8.0  --   --   --   --   ALBUMIN 3.9  --   --   --   --   AST 30  --   --   --   --   ALT 44  --   --   --   --   ALKPHOS 91  --   --   --   --   BILITOT 0.5  --   --   --   --   GFRNONAA 48*   < > 36* 19*  26*  GFRAA 56*   < > 42* 22* 30*  ANIONGAP 8   < > 11 13 9    < > = values in this interval not displayed.     Hematology Recent Labs  Lab 02/03/20 0701 02/04/20 0611  WBC 7.5 8.0  RBC 3.40* 3.60*  HGB 10.4* 10.7*  HCT 33.8* 34.6*  MCV 99.4 96.1  MCH 30.6 29.7  MCHC 30.8 30.9  RDW 13.1 13.1  PLT 192 211    Cardiac EnzymesNo results for input(s): TROPONINI in the last 168 hours. No results for input(s): TROPIPOC in the last 168 hours.   BNP Recent Labs  Lab 02/03/20 0701  BNP 968.1*     DDimer No results for input(s): DDIMER in the last 168 hours.   Radiology    No results found.  Cardiac Studies   ECHO 02/03/20  1. Technically difficult; definity used; mild to moderate global  reduction in LV systolic function; restrictive filling; mild LVH; mild AS;  mild AI; mild MR; moderate LAE.  2. Left ventricular ejection fraction, by estimation, is 40 to 45%. The  left ventricle has mildly decreased function. The left ventricle  demonstrates global hypokinesis. There is mild left ventricular  hypertrophy. Left ventricular diastolic parameters  are consistent with Grade III diastolic dysfunction (restrictive).  3. Right ventricular systolic function is normal. The right ventricular  size is normal. There is normal pulmonary artery systolic pressure.  4. Left atrial size was  moderately dilated.  5. The mitral valve is normal in structure. Mild mitral valve  regurgitation. No evidence of mitral stenosis.  6. The aortic valve is abnormal. Aortic valve regurgitation is mild. Mild  aortic valve stenosis.   Patient Profile     86 y.o. 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   Assessment & Plan    Acute systolic/diastolicHF:   Hydrated because of her event of near syncope and increased creat.  Holding diuretic.   Her BP is low but I have titrated her meds for her cardiomyopathy with increased Imdur and added hydralazine.  I think she is tolerating this.  Will need to follow BP while here and decide on this dose or lower.    HTN:  This is being managed in the context of treating his CHF  ICD/PPM Medtronic:  Device placed 2013 and last check 2018.  We had it checked this month and has 18 month battery life.  No ICD shocks .  Needs to be reestablished with EP follow up after discharge.  We will arrange follow up in our device clinic  NICM.  Cath  2016. EF at that time 25-30% it had improved. EF now stable at 40-45% with G3DD   CKD 3a:  Creat improved after hydration.    For questions or updates, please contact [place] Please consult [email or web address] for contact info under Cardiology/STEMI.   Signed, Minus Breeding, MD  02/06/2020, 12:31 PM

## 2020-02-07 DIAGNOSIS — I5022 Chronic systolic (congestive) heart failure: Secondary | ICD-10-CM

## 2020-02-07 LAB — BASIC METABOLIC PANEL
Anion gap: 6 (ref 5–15)
BUN: 34 mg/dL — ABNORMAL HIGH (ref 8–23)
CO2: 26 mmol/L (ref 22–32)
Calcium: 8.9 mg/dL (ref 8.9–10.3)
Chloride: 111 mmol/L (ref 98–111)
Creatinine, Ser: 1.24 mg/dL — ABNORMAL HIGH (ref 0.44–1.00)
GFR calc Af Amer: 47 mL/min — ABNORMAL LOW (ref >60)
GFR calc non Af Amer: 40 mL/min — ABNORMAL LOW (ref >60)
Glucose, Bld: 83 mg/dL (ref 70–99)
Potassium: 4.4 mmol/L (ref 3.5–5.1)
Sodium: 143 mmol/L (ref 135–145)

## 2020-02-07 MED ORDER — FUROSEMIDE 20 MG PO TABS
20.0000 mg | ORAL_TABLET | Freq: Every day | ORAL | 11 refills | Status: DC | PRN
Start: 2020-02-07 — End: 2020-03-03

## 2020-02-07 MED ORDER — ENOXAPARIN SODIUM 40 MG/0.4ML ~~LOC~~ SOLN
40.0000 mg | SUBCUTANEOUS | Status: DC
  Administered 2020-02-07 – 2020-02-09 (×3): 40 mg via SUBCUTANEOUS
  Filled 2020-02-07 (×3): qty 0.4

## 2020-02-07 MED ORDER — FAMOTIDINE 20 MG PO TABS: 20.0000 mg | ORAL_TABLET | Freq: Every day | ORAL | 1 refills | Status: AC

## 2020-02-07 MED ORDER — HYDRALAZINE HCL 25 MG PO TABS: 25.0000 mg | ORAL_TABLET | Freq: Three times a day (TID) | ORAL | 1 refills | Status: DC

## 2020-02-07 MED ORDER — SODIUM CHLORIDE 0.9 % IV SOLN
INTRAVENOUS | Status: DC | PRN
  Administered 2020-02-07: 1000 mL via INTRAVENOUS

## 2020-02-07 MED ORDER — ISOSORBIDE MONONITRATE ER 60 MG PO TB24: 60.0000 mg | ORAL_TABLET | Freq: Every day | ORAL | 1 refills | Status: AC

## 2020-02-07 NOTE — Progress Notes (Signed)
Progress Note  Patient Name: [name] Date of Encounter: 02/07/2020  Primary Cardiologist:   No primary care provider on file.   Subjective   She seems confused.  Still reports cough.  She did not do much with PT yesterday.  Inpatient Medications    Scheduled Meds: . aspirin EC  81 mg Oral Daily  . atorvastatin  20 mg Oral QHS  . carvedilol  6.25 mg Oral BID WC  . enoxaparin (LOVENOX) injection  30 mg Subcutaneous Q24H  . famotidine  20 mg Oral Daily  . guaiFENesin  1,200 mg Oral BID  . hydrALAZINE  25 mg Oral Q8H  . isosorbide mononitrate  60 mg Oral Daily  . levothyroxine  50 mcg Oral QAC breakfast  . loratadine  10 mg Oral Daily  . polyethylene glycol  17 g Oral Daily  . polyvinyl alcohol  1 drop Both Eyes TID   Continuous Infusions: . sodium chloride 10 mL/hr at 02/07/20 0531   PRN Meds: sodium chloride, acetaminophen **OR** acetaminophen, alum & mag hydroxide-simeth, guaiFENesin, hydrALAZINE, magnesium hydroxide, ondansetron **OR** ondansetron (ZOFRAN) IV, sodium chloride   Vital Signs    Vitals:   02/06/20 1245 02/06/20 2127 02/07/20 0503 02/07/20 0655  BP: 140/80 (!) 137/54 (!) 141/56   Pulse: 77 91 89   Resp:  20 20   Temp: 98.9 F (37.2 C) 99.2 F (37.3 C) 98.2 F (36.8 C)   TempSrc: Oral Oral Oral   SpO2: 99% 97% 97%   Weight:    92.7 kg  Height:        Intake/Output Summary (Last 24 hours) at 02/07/2020 0741 Last data filed at 02/07/2020 0531 Gross per 24 hour  Intake 736.05 ml  Output 1500 ml  Net -763.95 ml   Filed Weights   02/05/20 0454 02/06/20 0500 02/07/20 0655  Weight: 88.8 kg 89.2 kg 92.7 kg    Telemetry    NSR with ventricular pacing  Personally Reviewed  ECG    NA - Personally Reviewed  Physical Exam   GEN: No  acute distress.   Neck: No  JVD Cardiac: RRR, no murmurs, rubs, or gallops.  Respiratory:   Decreased breath sounds GI: Soft, nontender, non-distended, normal bowel sounds  MS:  No edema; No  deformity. Neuro:   Nonfocal  Psych: Oriented and appropriate     Labs    Chemistry Recent Labs  Lab 02/03/20 0701 02/04/20 0611 02/05/20 0505 02/06/20 0445 02/07/20 0528  NA 141   < > 139 137 143  K 4.0   < > 3.8 3.9 4.4  CL 103   < > 99 102 111  CO2 30   < > 27 26 26   GLUCOSE 91   < > 78 84 83  BUN 17   < > 41* 45* 34*  CREATININE 1.07*   < > 2.27* 1.80* 1.24*  CALCIUM 10.1   < > 9.4 8.5* 8.9  PROT 8.0  --   --   --   --   ALBUMIN 3.9  --   --   --   --   AST 30  --   --   --   --   ALT 44  --   --   --   --   ALKPHOS 91  --   --   --   --   BILITOT 0.5  --   --   --   --   GFRNONAA 48*   < >  19* 26* 40*  GFRAA 56*   < > 22* 30* 47*  ANIONGAP 8   < > 13 9 6    < > = values in this interval not displayed.     Hematology Recent Labs  Lab 02/03/20 0701 02/04/20 0611  WBC 7.5 8.0  RBC 3.40* 3.60*  HGB 10.4* 10.7*  HCT 33.8* 34.6*  MCV 99.4 96.1  MCH 30.6 29.7  MCHC 30.8 30.9  RDW 13.1 13.1  PLT 192 211    Cardiac EnzymesNo results for input(s): TROPONINI in the last 168 hours. No results for input(s): TROPIPOC in the last 168 hours.   BNP Recent Labs  Lab 02/03/20 0701  BNP 968.1*     DDimer No results for input(s): DDIMER in the last 168 hours.   Radiology    DG Chest 2 View  Result Date: 02/06/2020 CLINICAL DATA:  Acute congestive heart failure. EXAM: CHEST - 2 VIEW COMPARISON:  February 03, 2020. FINDINGS: Stable cardiomegaly with central pulmonary vascular congestion. No pneumothorax is noted. Right lung is clear. Minimal left basilar subsegmental atelectasis is noted with probable small pleural effusion. Left-sided pacemaker is unchanged in position. Bony thorax is unremarkable. IMPRESSION: Minimal left basilar subsegmental atelectasis is noted with probable small pleural effusion. Stable cardiomegaly with central pulmonary vascular congestion. Electronically Signed   By: Marijo Conception M.D.   On: 02/06/2020 15:03    Cardiac Studies   ECHO  02/03/20  1. Technically difficult; definity used; mild to moderate global  reduction in LV systolic function; restrictive filling; mild LVH; mild AS;  mild AI; mild MR; moderate LAE.  2. Left ventricular ejection fraction, by estimation, is 40 to 45%. The  left ventricle has mildly decreased function. The left ventricle  demonstrates global hypokinesis. There is mild left ventricular  hypertrophy. Left ventricular diastolic parameters  are consistent with Grade III diastolic dysfunction (restrictive).  3. Right ventricular systolic function is normal. The right ventricular  size is normal. There is normal pulmonary artery systolic pressure.  4. Left atrial size was moderately dilated.  5. The mitral valve is normal in structure. Mild mitral valve  regurgitation. No evidence of mitral stenosis.  6. The aortic valve is abnormal. Aortic valve regurgitation is mild. Mild  aortic valve stenosis.   Patient Profile     86 y.o. 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   Assessment & Plan    Acute systolic/diastolicHF:   Hydrated because of her event of near syncope and increased creat.  Holding diuretic.   At discharge I would think that she would need a low dose of Lasix 20 mg only to be taken for swelling, SOB or weight gain. Otherwise meds as on MAR.   HTN:   This is being managed in the context of treating his CHF.  Continue meds as listed.  (It would be reasonable to ask the pharmacy in hospital if they think she would be able to afford BiDil and change Imdur/hydral to this for compliance and ease of administration.)   ICD/PPM Medtr[employer]:  Device placed 2013 and last check 2018.  We had it checked this month and has 18 month battery life.  No ICD shocks .  Needs to  be reestablished with EP follow up after discharge.  We will  arrange follow up in our device clinic.  We will call her with followup.    NICM.  Cath  2016. EF at that time 25-30% it had improved. EF now stable at 40-45% with G3DD   CKD 3a:  Creat improved after hydration and down to baseliine.    ABNORMAL LUNG EXAM:  She dose have diffuse dry crackles.  If she continue to have SOB, given the absence of response to diuresis, I would suggest, high resolution CT and follow up with pulmonary.   We will send her a [facility] appt and an EP appt.    For questions or updates, please contact [place] Please consult [email or web address] for contact info under Cardiology/STEMI.   Signed, Minus Breeding, MD  02/07/2020, 7:41 AM

## 2020-02-07 NOTE — Progress Notes (Signed)
CSW received a call from the [facility], [facility], [facility] RN CM who stated that pt's facility cannot accept the pt back until Monday.  CSW will continue to follow for D/C needs.  Alphonse Guild. Zane Samson  MSW, LCSW, LCAS, CSI Transitions of Care Clinical Social Worker Care Coordination Department Ph: [phone]

## 2020-02-07 NOTE — Discharge Summary (Addendum)
Discharge Summary  [name] [id number] DOB: November 17, 1933  PCP: [name], PA  Admit date: 02/03/2020 Discharge date: 02/09/20  Time spent: 39 minutes  Recommendations for Outpatient Follow-up:  1. Medication change: Lasix changed from 40 mg daily to 20 mg as needed for edema/weight gain from fluid 2. Medication change: Imdur increased from 30 mg to 60 mg daily 3. Medication change: Due to renal function, Pepcid decreased from 40 mg to 20 mg daily. 4. Cardiology will follow up with patient as outpatient.  They will call to schedule appointment. 5. Patient to follow-up with her PCP in the next few weeks  Discharge Diagnoses:  Active Hospital Problems   Diagnosis Date Noted  . Acute on chronic diastolic CHF (congestive heart failure) ([place]) 02/03/2020  . Essential hypertension 11/16/2015  . Hypothyroidism 11/16/2015  . CKD (chronic kidney disease) stage 3, GFR 30-59 ml/min 07/27/2015  . Dyslipidemia 07/27/2015  . Acute encephalopathy 07/24/2015  . Dementia ([place]) 07/23/2015  . ICD in place 04/14/2015    Resolved Hospital Problems  No resolved problems to display.    Discharge Condition: Improved, being discharged back to her assisted living facility  Diet recommendation: Heart healthy  Vitals:   02/06/20 2127 02/07/20 0503  BP: (!) 137/54 (!) 141/56  Pulse: 91 89  Resp: 20 20  Temp: 99.2 F (37.3 C) 98.2 F (36.8 C)  SpO2: 97% 97%    History of present illness:  86 year old female with past medical history of nonischemic cardiomyopathy with ICD placement, chronic systolic/diastolic heart failure and moderate pulmonary hypertension plus stage III chronic kidney disease and mild dementia who presented from assisted living facility on 4/27 with worsening shortness of breath and cough.  Patient already received Covid vaccine.  BNP elevated at 968 and patient admitted for acute heart failure to the hospitalist service.  Hospital Course:     Acute on chronic  systolic/diastolic CHF (congestive heart failure).Repeat echocardiogram done 4/27 noting actual improvement in her heart function from an echo in 2018.  Ejection fraction at 40-45% and grade 2 diastolic dysfunction.  Patient received IV Lasix and diuresed over a liter of fluid, however her creatinine rapidly increased.  Cardiology consulted and patient stopped on Lasix.  She received some very gentle IV fluids and creatinine continued to improve.  They recommended upon discharge, changing her Lasix to 20 mg daily as needed for edema or weight gain from fluid.    ICD in place Senile dementia [facility]) with acute behavioral disturbance/acute encephalopathy: At times little confused in the hospital, may have been from acute kidney injury.  No evidence of hypoxia.  Alert and oriented x2 on day of discharge    Dyslipidemia: Continue statin  Anemia of chronic disease: Hemoglobin stable. . Acute kidney injury in the setting of CKD (chronic kidney disease) stage 3, GFR 30-59 ml/min: This has continued to improve and by 5/1, down to 1.1    Essential hypertension: Medications adjusted in the context of treating her CHF.  Lasix as above plus Imdur increased to 60 mg daily.    Hypothyroidism: Continue Synthroid.   Procedures:  2 D Echocardiogram done 4/27: EF of [id number], grade 2 diastolic dysfunction  Consultations:  Cardiology  Discharge Exam: BP (!) 141/56 (BP Location: Right Arm)   Pulse 89   Temp 98.2 F (36.8 C) (Oral)   Resp 20   Ht 5\' 6"  (1.676 m)   Wt 92.7 kg   SpO2 97%   BMI 32.99 kg/m   General: Alert and oriented  x2, no acute distress Cardiovascular: Regular rate and rhythm, S1-S2, 2 out of 6 stock ejection murmur Respiratory: Clear to auscultation bilaterally  Discharge Instructions You were cared for by a hospitalist during your hospital stay. If you have any questions about your discharge medications or the care you received while you were in the hospital after you are  discharged, you can call the unit and asked to speak with the hospitalist on call if the hospitalist that took care of you is not available. Once you are discharged, your primary care physician will handle any further medical issues. Please note that NO REFILLS for any discharge medications will be authorized once you are discharged, as it is imperative that you return to your primary care physician (or establish a relationship with a primary care physician if you do not have one) for your aftercare needs so that they can reassess your need for medications and monitor your lab values.  Discharge Instructions    Diet - low sodium heart healthy   Complete by: As directed    Increase activity slowly   Complete by: As directed      Allergies as of 02/07/2020   No Known Allergies     Medication List    STOP taking these medications   feeding supplement (ENSURE ENLIVE) Liqd     TAKE these medications   acetaminophen 500 MG tablet Commonly known as: TYLENOL Take 1,000 mg by mouth in the morning and at bedtime.   alum & mag hydroxide-simeth 200-200-20 MG/5ML suspension Commonly known as: MAALOX/MYLANTA Take 30 mLs by mouth as needed for indigestion or heartburn (do not exceed 4 doses in 24hrs).   aspirin EC 81 MG tablet Take 1 tablet (81 mg total) by mouth every morning.   atorvastatin 20 MG tablet Commonly known as: LIPITOR Take 20 mg by mouth at bedtime.   Calcium Carbonate-Vitamin D 600-400 MG-UNIT tablet Take 1 tablet by mouth 2 (two) times a day.   carvedilol 6.25 MG tablet Commonly known as: COREG Take 6.25 mg by mouth 2 (two) times daily with a meal.   cyclobenzaprine 5 MG tablet Commonly known as: FLEXERIL Take 5 mg by mouth at bedtime.   famotidine 20 MG tablet Commonly known as: PEPCID Take 1 tablet (20 mg total) by mouth daily. Start taking on: Feb 08, 2020 What changed:   medication strength  how much to take   Fluticasone-Salmeterol 500-50 MCG/DOSE  Aepb Commonly known as: ADVAIR Inhale 1 puff into the lungs 2 (two) times daily. Rinse mouth after use   furosemide 20 MG tablet Commonly known as: Lasix Take 1 tablet (20 mg total) by mouth daily as needed for edema (weight gain from fluid). What changed:   medication strength  how much to take  when to take this  reasons to take this   guaifenesin 100 MG/5ML syrup Commonly known as: ROBITUSSIN Take 200 mg by mouth every 6 (six) hours as needed for cough.   hydrALAZINE 25 MG tablet Commonly known as: APRESOLINE Take 1 tablet (25 mg total) by mouth every 8 (eight) hours.   iron polysaccharides 150 MG capsule Commonly known as: NIFEREX Take 150 mg by mouth 2 (two) times daily.   isosorbide mononitrate 60 MG 24 hr tablet Commonly known as: IMDUR Take 1 tablet (60 mg total) by mouth daily. Start taking on: Feb 08, 2020 What changed:   medication strength  how much to take   levothyroxine 50 MCG tablet Commonly known as: SYNTHROID Take 50  mcg by mouth daily.   loperamide 2 MG tablet Commonly known as: IMODIUM A-D Take 2 mg by mouth as needed for diarrhea or loose stools (do not exceed 8 doses in 24hrs).   loratadine 10 MG tablet Commonly known as: CLARITIN Take 10 mg by mouth daily.   magnesium hydroxide 400 MG/5ML suspension Commonly known as: MILK OF MAGNESIA Take 30 mLs by mouth at bedtime as needed for mild constipation.   neomycin-bacitracin-polymyxin [phone] ointment Apply 1 application topically as needed (skin cuts).   ondansetron 4 MG tablet Commonly known as: ZOFRAN Take 1 tablet (4 mg total) by mouth every 6 (six) hours as needed for nausea or vomiting.   polyethylene glycol 17 g packet Commonly known as: MIRALAX / GLYCOLAX Take 17 g by mouth daily. Mix with 8 ounces of fluid and drink   REFRESH 1 % ophthalmic solution Generic drug: carboxymethylcellulose Place 1 drop into both eyes 3 (three) times daily.   sodium chloride 0.65 % Soln  nasal spray Commonly known as: OCEAN Place 1 spray into both nostrils as needed for congestion.      No Known Allergies Follow-up Information    [name], [place] Follow up in 2 week(s).   Specialty: Internal Medicine Contact information: [address] [place] [address] [phone]        [facility] Follow up in 1 month(s).   Why: The office will call you to schedule a follow-up appointment in the next month Contact information: [place] [phone]           The results of significant diagnostics from this hospitalization (including imaging, microbiology, ancillary and laboratory) are listed below for reference.    Significant Diagnostic Studies: DG Chest 2 View  Result Date: 02/06/2020 CLINICAL DATA:  Acute congestive heart failure. EXAM: CHEST - 2 VIEW COMPARISON:  February 03, 2020. FINDINGS: Stable cardiomegaly with central pulmonary vascular congestion. No pneumothorax is noted. Right lung is clear. Minimal left basilar subsegmental atelectasis is noted with probable small pleural effusion. Left-sided pacemaker is unchanged in position. Bony thorax is unremarkable. IMPRESSION: Minimal left basilar subsegmental atelectasis is noted with probable small pleural effusion. Stable cardiomegaly with central pulmonary vascular congestion. Electronically Signed   By: Marijo Conception M.D.   On: 02/06/2020 15:03   DG Chest 2 View  Result Date: 02/03/2020 CLINICAL DATA:  Dyspnea, cough EXAM: CHEST - 2 VIEW COMPARISON:  03/15/2018 FINDINGS: Stable positioning of left-sided implanted cardiac device. Unchanged cardiomegaly. Atherosclerotic calcification of the aortic knob. Low lung volumes with mildly prominent interstitial markings. No focal airspace consolidation is seen. No pleural effusion or pneumothorax. IMPRESSION: Cardiomegaly with mildly prominent bilateral  interstitial markings which may reflect mild edema or atypical/viral infection. Electronically Signed   By: Davina Poke D.O.   On: 02/03/2020 08:22   ECHOCARDIOGRAM COMPLETE  Result Date: 02/03/2020    ECHOCARDIOGRAM REPORT   Patient Name:   [name] Date of Exam: 02/03/2020 Medical Rec #:  [id number]     Height:       66.0 in Accession #:    [id number]    Weight:       189.0 lb Date of Birth:  08-20-1934     BSA:          1.952 m Patient Age:    44 years      BP:  171/75 mmHg Patient Gender: F             HR:           92 bpm. Exam Location:  Inpatient Procedure: 2D Echo and Intracardiac Opacification Agent Indications:    CHF-Acute Diastolic [id number] / [id number]  History:        Patient has prior history of Echocardiogram examinations, most                 recent 04/23/2017. NICM (nonischemic cardiomyopathy), ICD, COPD;                 Risk Factors:Hypertension. CKD.  Sonographer:    Vikki Ports Turrentine Referring Phys: [id number] [place]  1. Technically difficult; definity used; mild to moderate global reduction in LV systolic function; restrictive filling; mild LVH; mild AS; mild AI; mild MR; moderate LAE.  2. Left ventricular ejection fraction, by estimation, is 40 to 45%. The left ventricle has mildly decreased function. The left ventricle demonstrates global hypokinesis. There is mild left ventricular hypertrophy. Left ventricular diastolic parameters are consistent with Grade III diastolic dysfunction (restrictive).  3. Right ventricular systolic function is normal. The right ventricular size is normal. There is normal pulmonary artery systolic pressure.  4. Left atrial size was moderately dilated.  5. The mitral valve is normal in structure. Mild mitral valve regurgitation. No evidence of mitral stenosis.  6. The aortic valve is abnormal. Aortic valve regurgitation is mild. Mild aortic valve stenosis. FINDINGS  Left Ventricle: Left ventricular ejection fraction, by estimation, is  40 to 45%. The left ventricle has mildly decreased function. The left ventricle demonstrates global hypokinesis. Definity contrast agent was given IV to delineate the left ventricular  endocardial borders. The left ventricular internal cavity size was normal in size. There is mild left ventricular hypertrophy. Left ventricular diastolic parameters are consistent with Grade III diastolic dysfunction (restrictive). Right Ventricle: The right ventricular size is normal.Right ventricular systolic function is normal. There is normal pulmonary artery systolic pressure. The tricuspid regurgitant velocity is 2.55 m/s, and with an assumed right atrial pressure of 3 mmHg, the estimated right ventricular systolic pressure is 0000000 mmHg. Left Atrium: Left atrial size was moderately dilated. Right Atrium: Right atrial size was normal in size. Pericardium: Trivial pericardial effusion is present. Mitral Valve: The mitral valve is normal in structure. Normal mobility of the mitral valve leaflets. Severe mitral annular calcification. Mild mitral valve regurgitation. No evidence of mitral valve stenosis. Tricuspid Valve: The tricuspid valve is normal in structure. Tricuspid valve regurgitation is mild . No evidence of tricuspid stenosis. Aortic Valve: The aortic valve is abnormal. Aortic valve regurgitation is mild. Aortic regurgitation PHT measures 325 msec. Mild aortic stenosis is present. Aortic valve mean gradient measures 7.3 mmHg. Aortic valve peak gradient measures 12.0 mmHg. Aortic valve area, by VTI measures 1.43 cm. Pulmonic Valve: The pulmonic valve was not well visualized. Pulmonic valve regurgitation is mild. No evidence of pulmonic stenosis. Aorta: The aortic root is normal in size and structure. Venous: The inferior vena cava was not well visualized.  Additional Comments: Technically difficult; definity used; mild to moderate global reduction in LV systolic function; restrictive filling; mild LVH; mild AS; mild AI;  mild MR; moderate LAE. A pacer wire is visualized.  LEFT VENTRICLE PLAX 2D LVIDd:         4.53 cm LVIDs:         3.59 cm LV PW:         1.19 cm  LV IVS:        1.18 cm LVOT diam:     1.90 cm LV SV:         41 LV SV Index:   21 LVOT Area:     2.84 cm  RIGHT VENTRICLE RV S prime:     10.40 cm/s TAPSE (M-mode): 1.6 cm LEFT ATRIUM             Index LA diam:        3.60 cm 1.84 cm/m LA Vol (A2C):   89.4 ml 45.79 ml/m LA Vol (A4C):   89.5 ml 45.84 ml/m LA Biplane Vol: 90.4 ml 46.30 ml/m  AORTIC VALVE AV Area (Vmax):    1.73 cm AV Area (Vmean):   1.39 cm AV Area (VTI):     1.43 cm AV Vmax:           173.33 cm/s AV Vmean:          128.667 cm/s AV VTI:            0.288 m AV Peak Grad:      12.0 mmHg AV Mean Grad:      7.3 mmHg LVOT Vmax:         106.00 cm/s LVOT Vmean:        62.900 cm/s LVOT VTI:          0.145 m LVOT/AV VTI ratio: 0.50 AI PHT:            325 msec  AORTA Ao Root diam: 3.10 cm MITRAL VALVE                TRICUSPID VALVE MV Area (PHT): 5.66 cm     TR Peak grad:   26.0 mmHg MV Decel Time: 134 msec     TR Vmax:        255.00 cm/s MR Peak grad: 121.9 mmHg MR Mean grad: 75.0 mmHg     SHUNTS MR Vmax:      552.00 cm/s   Systemic VTI:  0.14 m MR Vmean:     410.0 cm/s    Systemic Diam: 1.90 cm MV E velocity: 192.00 cm/s MV A velocity: 42.60 cm/s MV E/A ratio:  4.51 Kirk Ruths MD Electronically signed by Kirk Ruths MD Signature Date/Time: 02/03/2020/3:35:40 PM    Final     Microbiology: Recent Results (from the past 240 hour(s))  Respiratory Panel by RT PCR (Flu A&B, Covid) - Nasopharyngeal Swab     Status: None   Collection Time: 02/03/20  8:48 AM   Specimen: Nasopharyngeal Swab  Result Value Ref Range Status   SARS Coronavirus 2 by RT PCR NEGATIVE NEGATIVE Final    Comment: (NOTE) SARS-CoV-2 target nucleic acids are NOT DETECTED. The SARS-CoV-2 RNA is generally detectable in upper respiratoy specimens during the acute phase of infection. The lowest concentration of SARS-CoV-2 viral  copies this assay can detect is 131 copies/mL. A negative result does not preclude SARS-Cov-2 infection and should not be used as the sole basis for treatment or other patient management decisions. A negative result may occur with  improper specimen collection/handling, submission of specimen other than nasopharyngeal swab, presence of viral mutation(s) within the areas targeted by this assay, and inadequate number of viral copies (<131 copies/mL). A negative result must be combined with clinical observations, patient history, and epidemiological information. The expected result is Negative. Fact Sheet for Patients:  PinkCheek.be Fact Sheet for Healthcare Providers:  GravelBags.it This test is not yet ap proved or  cleared by the [place] and  has been authorized for detection and/or diagnosis of SARS-CoV-2 by FDA under an Emergency Use Authorization (EUA). This EUA will remain  in effect (meaning this test can be used) for the duration of the COVID-19 declaration under Section 564(b)(1) of the Act, 21 U.S.C. section 360bbb-3(b)(1), unless the authorization is terminated or revoked sooner.    Influenza A by PCR NEGATIVE NEGATIVE Final   Influenza B by PCR NEGATIVE NEGATIVE Final    Comment: (NOTE) The Xpert Xpress SARS-CoV-2/FLU/RSV assay is intended as an aid in  the diagnosis of influenza from Nasopharyngeal swab specimens and  should not be used as a sole basis for treatment. Nasal washings and  aspirates are unacceptable for Xpert Xpress SARS-CoV-2/FLU/RSV  testing. Fact Sheet for Patients: PinkCheek.be Fact Sheet for Healthcare Providers: GravelBags.it This test is not yet approved or cleared by the [place] FDA and  has been authorized for detection and/or diagnosis of SARS-CoV-2 by  FDA under an Emergency Use Authorization (EUA). This EUA will  remain  in effect (meaning this test can be used) for the duration of the  Covid-19 declaration under Section 564(b)(1) of the Act, 21  U.S.C. section 360bbb-3(b)(1), unless the authorization is  terminated or revoked. Performed at [facility], [place] [address], [place], [place] [address]      Labs: Basic Metabolic Panel: Recent Labs  Lab 02/03/20 0701 02/04/20 0611 02/05/20 0505 02/06/20 0445 02/07/20 0528  NA 141 140 139 137 143  K 4.0 3.7 3.8 3.9 4.4  CL 103 100 99 102 111  CO2 30 29 27 26 26   GLUCOSE 91 86 78 84 83  BUN 17 25* 41* 45* 34*  CREATININE 1.07* 1.36* 2.27* 1.80* 1.24*  CALCIUM 10.1 9.8 9.4 8.5* 8.9   Liver Function Tests: Recent Labs  Lab 02/03/20 0701  AST 30  ALT 44  ALKPHOS 91  BILITOT 0.5  PROT 8.0  ALBUMIN 3.9   No results for input(s): LIPASE, AMYLASE in the last 168 hours. No results for input(s): AMMONIA in the last 168 hours. CBC: Recent Labs  Lab 02/03/20 0701 02/04/20 0611  WBC 7.5 8.0  NEUTROABS 4.8  --   HGB 10.4* 10.7*  HCT 33.8* 34.6*  MCV 99.4 96.1  PLT 192 211   Cardiac Enzymes: No results for input(s): CKTOTAL, CKMB, CKMBINDEX, TROPONINI in the last 168 hours. BNP: BNP (last 3 results) Recent Labs    02/03/20 0701  BNP 968.1*    ProBNP (last 3 results) No results for input(s): PROBNP in the last 8760 hours.  CBG: Recent Labs  Lab 02/05/20 0930  GLUCAP 130*       Signed:  Flora Lipps,  MD [facility] 02/07/2020, 3:22 PM

## 2020-02-08 DIAGNOSIS — N179 Acute kidney failure, unspecified: Secondary | ICD-10-CM | POA: Diagnosis not present

## 2020-02-08 DIAGNOSIS — F0281 Dementia in other diseases classified elsewhere with behavioral disturbance: Secondary | ICD-10-CM

## 2020-02-08 DIAGNOSIS — I5033 Acute on chronic diastolic (congestive) heart failure: Secondary | ICD-10-CM

## 2020-02-08 DIAGNOSIS — G309 Alzheimer's disease, unspecified: Secondary | ICD-10-CM

## 2020-02-08 LAB — BASIC METABOLIC PANEL
Anion gap: 6 (ref 5–15)
BUN: 27 mg/dL — ABNORMAL HIGH (ref 8–23)
CO2: 24 mmol/L (ref 22–32)
Calcium: 9.2 mg/dL (ref 8.9–10.3)
Chloride: 108 mmol/L (ref 98–111)
Creatinine, Ser: 1.15 mg/dL — ABNORMAL HIGH (ref 0.44–1.00)
GFR calc Af Amer: 51 mL/min — ABNORMAL LOW (ref >60)
GFR calc non Af Amer: 44 mL/min — ABNORMAL LOW (ref >60)
Glucose, Bld: 94 mg/dL (ref 70–99)
Potassium: 4.6 mmol/L (ref 3.5–5.1)
Sodium: 138 mmol/L (ref 135–145)

## 2020-02-08 NOTE — Progress Notes (Signed)
PROGRESS NOTE  [name] [id number] DOB: 1934/01/01 DOA: 02/03/2020 PCP: [name], PA  HPI/Recap of past 71 hours: 85 year old female with past medical history of nonischemic cardiomyopathy with ICD placement, chronic systolic/diastolic heart failure and moderate pulmonary hypertension plus stage III chronic kidney disease and mild dementia who presented from assisted living facility on 4/27 with worsening shortness of breath and cough.  Patient already received Covid vaccine.  BNP elevated at 968 and patient admitted for acute heart failure to the hospitalist service.   Assessment/Plan: Principal Problem:   Acute on chronic systolic/diastolic CHF (congestive heart failure) ([place]): Repeat echocardiogram done 4/27 noting actual improvement in her heart function from an echo in 2018.  Ejection fraction at 40-45% and grade 2 diastolic dysfunction.  Patient received IV Lasix and diuresed over a liter of fluid, however her creatinine rapidly increased.  Cardiology consulted and patient stopped on Lasix.  She received some very gentle IV fluids and creatinine continued to improve.  They recommended upon discharge, changing her Lasix to 20 mg daily as needed for edema or weight gain from fluid.  Active Problems:   ICD in place Senile dementia [facility]) with acute behavioral disturbance/acute encephalopathy: At times little confused in the hospital, may have been from acute kidney injury.  No evidence of hypoxia.  Alert and oriented x2 for the last few days    Dyslipidemia: Continue statin  Anemia of chronic disease: Hemoglobin stable. . Acute kidney injury in the setting of CKD (chronic kidney disease) stage 3, GFR 30-59 ml/min: This has continued to improve and by 5/2, down to 1.15    Essential hypertension: Medications adjusted in the context of treating her CHF.  Lasix as above plus Imdur increased to 60 mg daily.    Hypothyroidism: Continue Synthroid.  Code Status: Full code   Family Communication: Left message with son  Disposition Plan: Initially patient was supposed to be discharged yesterday, 5/1, however assisted living facility did not have services available to take her.  Discharge canceled and plan to discharge on Monday, 5/3.  Discharge back to assisted living tomorrow   Consultants:  Cardiology  Procedures:  Echocardiogram done 4/27: EF of [id number], grade 2 diastolic dysfunction  Antimicrobials:  None  DVT prophylaxis: Lovenox   Objective: Vitals:   02/08/20 0526 02/08/20 1333  BP: (!) 138/57 (!) 129/59  Pulse: 89 88  Resp: 18 17  Temp: 98.9 F (37.2 C) 99 F (37.2 C)  SpO2: 96% 100%    Intake/Output Summary (Last 24 hours) at 02/08/2020 1404 Last data filed at 02/08/2020 0300 Gross per 24 hour  Intake 255.14 ml  Output 750 ml  Net -494.86 ml   Filed Weights   02/06/20 0500 02/07/20 0655 02/08/20 0500  Weight: 89.2 kg 92.7 kg 92.1 kg   Body mass index is 32.77 kg/m.  Exam:   General: Alert and oriented x2, no acute distress  Cardiovascular: Regular rate and rhythm, S1-S2  Respiratory: Clear to auscultation bilaterally  Extremities: No clubbing or cyanosis, trace pitting edema   Data Reviewed: CBC: Recent Labs  Lab 02/03/20 0701 02/04/20 0611  WBC 7.5 8.0  NEUTROABS 4.8  --   HGB 10.4* 10.7*  HCT 33.8* 34.6*  MCV 99.4 96.1  PLT 192 [id number]   Basic Metabolic Panel: Recent Labs  Lab 02/04/20 0611 02/05/20 0505 02/06/20 0445 02/07/20 0528 02/08/20 0355  NA 140 139 137 143 138  K 3.7 3.8 3.9 4.4 4.6  CL 100 99 102 111 108  CO2  29 27 26 26 24   GLUCOSE 86 78 84 83 94  BUN 25* 41* 45* 34* 27*  CREATININE 1.36* 2.27* 1.80* 1.24* 1.15*  CALCIUM 9.8 9.4 8.5* 8.9 9.2   GFR: Estimated Creatinine Clearance: 42.4 mL/min (A) (by C-G formula based on SCr of 1.15 mg/dL (H)). Liver Function Tests: Recent Labs  Lab 02/03/20 0701  AST 30  ALT 44  ALKPHOS 91  BILITOT 0.5  PROT 8.0  ALBUMIN 3.9   No results  for input(s): LIPASE, AMYLASE in the last 168 hours. No results for input(s): AMMONIA in the last 168 hours. Coagulation Profile: No results for input(s): INR, PROTIME in the last 168 hours. Cardiac Enzymes: No results for input(s): CKTOTAL, CKMB, CKMBINDEX, TROPONINI in the last 168 hours. BNP (last 3 results) No results for input(s): PROBNP in the last 8760 hours. HbA1C: No results for input(s): HGBA1C in the last 72 hours. CBG: Recent Labs  Lab 02/05/20 0930  GLUCAP 130*   Lipid Profile: No results for input(s): CHOL, HDL, LDLCALC, TRIG, CHOLHDL, LDLDIRECT in the last 72 hours. Thyroid Function Tests: No results for input(s): TSH, T4TOTAL, FREET4, T3FREE, THYROIDAB in the last 72 hours. Anemia Panel: No results for input(s): VITAMINB12, FOLATE, FERRITIN, TIBC, IRON, RETICCTPCT in the last 72 hours. Urine analysis:    Component Value Date/Time   COLORURINE YELLOW 04/18/2019 1648   APPEARANCEUR HAZY (A) 04/18/2019 1648   LABSPEC 1.012 04/18/2019 1648   PHURINE 5.0 04/18/2019 1648   GLUCOSEU NEGATIVE 04/18/2019 1648   HGBUR NEGATIVE 04/18/2019 1648   BILIRUBINUR NEGATIVE 04/18/2019 1648   KETONESUR NEGATIVE 04/18/2019 1648   PROTEINUR NEGATIVE 04/18/2019 1648   UROBILINOGEN 0.2 07/23/2015 1610   NITRITE NEGATIVE 04/18/2019 1648   LEUKOCYTESUR TRACE (A) 04/18/2019 1648   Sepsis Labs: @LABRCNTIP (procalcitonin:4,lacticidven:4)  ) Recent Results (from the past 240 hour(s))  Respiratory Panel by RT PCR (Flu A&B, Covid) - Nasopharyngeal Swab     Status: None   Collection Time: 02/03/20  8:48 AM   Specimen: Nasopharyngeal Swab  Result Value Ref Range Status   SARS Coronavirus 2 by RT PCR NEGATIVE NEGATIVE Final    Comment: (NOTE) SARS-CoV-2 target nucleic acids are NOT DETECTED. The SARS-CoV-2 RNA is generally detectable in upper respiratoy specimens during the acute phase of infection. The lowest concentration of SARS-CoV-2 viral copies this assay can detect is 131  copies/mL. A negative result does not preclude SARS-Cov-2 infection and should not be used as the sole basis for treatment or other patient management decisions. A negative result may occur with  improper specimen collection/handling, submission of specimen other than nasopharyngeal swab, presence of viral mutation(s) within the areas targeted by this assay, and inadequate number of viral copies (<131 copies/mL). A negative result must be combined with clinical observations, patient history, and epidemiological information. The expected result is Negative. Fact Sheet for Patients:  PinkCheek.be Fact Sheet for Healthcare Providers:  GravelBags.it This test is not yet ap proved or cleared by the [place] FDA and  has been authorized for detection and/or diagnosis of SARS-CoV-2 by FDA under an Emergency Use Authorization (EUA). This EUA will remain  in effect (meaning this test can be used) for the duration of the COVID-19 declaration under Section 564(b)(1) of the Act, 21 U.S.C. section 360bbb-3(b)(1), unless the authorization is terminated or revoked sooner.    Influenza A by PCR NEGATIVE NEGATIVE Final   Influenza B by PCR NEGATIVE NEGATIVE Final    Comment: (NOTE) The Xpert Xpress SARS-CoV-2/FLU/RSV assay is intended  as an aid in  the diagnosis of influenza from Nasopharyngeal swab specimens and  should not be used as a sole basis for treatment. Nasal washings and  aspirates are unacceptable for Xpert Xpress SARS-CoV-2/FLU/RSV  testing. Fact Sheet for Patients: PinkCheek.be Fact Sheet for Healthcare Providers: GravelBags.it This test is not yet approved or cleared by the [place] FDA and  has been authorized for detection and/or diagnosis of SARS-CoV-2 by  FDA under an Emergency Use Authorization (EUA). This EUA will remain  in effect (meaning this test can  be used) for the duration of the  Covid-19 declaration under Section 564(b)(1) of the Act, 21  U.S.C. section 360bbb-3(b)(1), unless the authorization is  terminated or revoked. Performed at [facility], [place] [address] [place], [place] [address]       Studies: No results found.  Scheduled Meds: . aspirin EC  81 mg Oral Daily  . atorvastatin  20 mg Oral QHS  . carvedilol  6.25 mg Oral BID WC  . enoxaparin (LOVENOX) injection  40 mg Subcutaneous Q24H  . famotidine  20 mg Oral Daily  . guaiFENesin  1,200 mg Oral BID  . hydrALAZINE  25 mg Oral Q8H  . isosorbide mononitrate  60 mg Oral Daily  . levothyroxine  50 mcg Oral QAC breakfast  . loratadine  10 mg Oral Daily  . polyethylene glycol  17 g Oral Daily  . polyvinyl alcohol  1 drop Both Eyes TID    Continuous Infusions: . sodium chloride Stopped (02/07/20 0731)     LOS: 3 days     Annita Brod, MD [facility]   02/08/2020, 2:04 PM

## 2020-02-08 NOTE — Progress Notes (Signed)
Occupational Therapy Treatment Patient Details Name: PRINCIE FASH MRN: [id number] DOB: 04/22/1934 Today's Date: 02/08/2020    History of present illness NYSSA PISCITELLI is a 86 y.o. female with a history of CHF last EF 40-45% in 2018, NICM with ICD,COPD, DM, hypertension, hyperlipidemia, hypothyroidism, pulmonary embolism not on anticoagulation, dementia, and colon cancer with colostomy who presents to the ED 02/03/20 from [facility] for evaluation of dyspnea,On chest x-ray patient has evidence of fluid overload and BNP is elevated consistent with CHF.   OT comments  Plan is back to ALF   Follow Up Recommendations  Home health OT at ALF   Equipment Recommendations  3 in 1 bedside commode    Recommendations for Other Services      Precautions / Restrictions Precautions Precautions: ICD/Pacemaker;Fall Restrictions Weight Bearing Restrictions: No       Mobility Bed Mobility Overal bed mobility: Needs Assistance Bed Mobility: Supine to Sit     Supine to sit: [facility] elevated;Min assist        Transfers Overall transfer level: Needs assistance Equipment used: Rolling walker (2 wheeled) Transfers: Sit to/from [facility] Sit to Stand: Mod assist;Min assist Stand pivot transfers: Min assist;Mod assist            Balance   Sitting-balance support: Feet supported;Bilateral upper extremity supported Sitting balance-Leahy Scale: Good     Standing balance support: Bilateral upper extremity supported;During functional activity Standing balance-Leahy Scale: Poor                             ADL either performed or assessed with clinical judgement   ADL                           Toilet Transfer: Minimal assistance;RW;BSC   Toileting- Clothing Manipulation and Hygiene: Moderate assistance;Sit to/from stand;Cueing for safety;Cueing for compensatory techniques;Cueing for sequencing       Functional mobility during ADLs: Minimal  assistance;Rolling walker General ADL Comments: VC for upright posture.  Pt very agreeabl to OOB, getting on BSC and to chair.     Vision Baseline Vision/History: Wears glasses            Cognition Arousal/Alertness: Awake/alert Behavior During Therapy: WFL for tasks assessed/performed Overall Cognitive Status: History of cognitive impairments - at baseline                                 General Comments: AxO x 4 pleasant stating "I'm not as strong as I was"                   Pertinent Vitals/ Pain       Pain Assessment: No/denies pain         Frequency  Min 2X/week        Progress Toward Goals  OT Goals(current goals can now be found in the care plan section)  Progress towards OT goals: Progressing toward goals  Acute Rehab OT Goals Patient Stated Goal: go back to facility *( ALF) OT Goal Formulation: With patient  Plan Discharge plan remains appropriate    Co-evaluation                 AM-PAC OT "6 Clicks" Daily Activity     Outcome Measure   Help from another person eating meals?: None Help from another person taking  care of personal grooming?: A Little Help from another person toileting, which includes using toliet, bedpan, or urinal?: A Lot Help from another person bathing (including washing, rinsing, drying)?: A Little Help from another person to put on and taking off regular upper body clothing?: A Little Help from another person to put on and taking off regular lower body clothing?: A Lot 6 Click Score: 17    End of Session Equipment Utilized During Treatment: Gait belt;Rolling walker  OT Visit Diagnosis: Unsteadiness on feet (R26.81);Muscle weakness (generalized) (M62.81)   Activity Tolerance Patient tolerated treatment well   Patient Left in chair;with call bell/phone within reach(RN reports no need for chair alarm)   Nurse Communication Mobility status        Time: [id number] OT Time Calculation (min): 15  min  Charges: OT General Charges $OT Visit: 1 Visit OT Treatments $Self Care/Home Management : 8-22 mins  Kari Baars, [place] [phone] Office- [phone]      Emmabelle Fear, Edwena Felty D 02/08/2020, 10:58 AM

## 2020-02-08 NOTE — TOC Progression Note (Signed)
Transition of Care [facility]) - Progression Note    Patient Details  Name: [name] MRN: [id number] Date of Birth: 06-Jul-1934  Transition of Care [facility]) CM/SW [place], [place] Phone Number: 02/08/2020, 8:53 AM  Clinical Narrative:   LCSW completed call to [facility] to inquire if patient could discharge there today. LCSW spoke with front desk and was informed that patient would have to come tomorrow on [id number] per their policy and also because no other staff were currently there. LCSW will update TOC RNCM. Expected discharge date is 02/09/20.  Expected Discharge Plan: Assisted Living Barriers to Discharge: Continued Medical Work up  Expected Discharge Plan and Services Expected Discharge Plan: Assisted Living   Discharge Planning Services: CM Consult Post Acute Care Choice: [place] arrangements for the past 2 months: [place] Expected Discharge Date: 02/09/20                     HH Arranged: PT, OT HH Agency: Other - See comment([facility]-contract w/facility for PT/OT.) Date HH Agency Contacted: 02/05/20 Time [place]: 1126 Representative spoke with at [place]: [place] @ [place]  Readmission Risk Interventions Readmission Risk Prevention Plan 04/24/2019 04/21/2019  Transportation Screening - Complete  HRI or [place] - Complete  Social Work Consult for [place] Planning/Counseling - Complete  Palliative Care Screening Not Applicable -  Some recent data might be hidden   [name], [name], MSW, LCSW Phone: [phone]

## 2020-02-08 NOTE — Progress Notes (Signed)
    [facility] will sign off.   Medication Recommendations:  As on MAR Other recommendations (labs, testing, etc):  NA Follow up as an outpatient:  We will arrange follow up for her device and in our general clinic.

## 2020-02-09 ENCOUNTER — Telehealth: Payer: Self-pay | Admitting: *Deleted

## 2020-02-09 DIAGNOSIS — G9340 Encephalopathy, unspecified: Secondary | ICD-10-CM

## 2020-02-09 NOTE — Progress Notes (Signed)
Patient discharged to [facility].  Report called and given to [place] - all questions answered satisfactorily.  IV removed - WNL.  AVS printed and sent with patient.  Patient in NAD at time of DC.

## 2020-02-09 NOTE — Telephone Encounter (Signed)
Contacted [employer] Patient Records to request patient's device records.  Will await receipt of implant sheet.

## 2020-02-09 NOTE — Telephone Encounter (Signed)
Records received. Patient enrolled in [place]. Spoke with Roxanne at [facility]. Carelink monitor ordered, should arrive in 7-10 business days. Direct DC number provided for assistance when monitor received.

## 2020-02-09 NOTE — Progress Notes (Signed)
Patient was seen and examined at bedside.   Today,  Complains of cough  O/E:  Vitals:   02/08/20 2048 02/09/20 0524  BP: (!) 157/68 (!) 124/91  Pulse: 98 95  Resp: (!) 22 20  Temp: 99.2 F (37.3 C) 98.6 F (37 C)  SpO2: 98% 96%   Alert awake and oriented x 2 Chest diminished breath sounds bilaterally Trace peripheral edema Abdomen is soft  Labs reviewed.   Medically stable for disposition. See discharge summary dictated and signed. No changes in the instructions.

## 2020-02-09 NOTE — TOC Transition Note (Signed)
Transition of Care [facility]) - CM/SW Discharge Note   Patient Details  Name: [name] MRN: [id number] Date of Birth: October 26, 1933  [facility]) CM/SW Contact:  Dessa Phi, RN Phone Number: 02/09/2020, 10:35 AM   Clinical Narrative:Spoke to Patient's son [name]-agree to p/u 3n1 from nsg floor today-[facility] rep Zach aware of 3n1 ordered & d/c today to deliver to rm or nsg station-patient to return back to [place] accepted-faxed fl2(2nd time),HHPT/OT orders,[id number],d/c summary w/confirmation.PTAR called Nsg aware of rm#[id number],& tel# for report [phone]. No further CM needs.       Final next level of care: Assisted Living Barriers to Discharge: No Barriers Identified   Patient Goals and CMS Choice Patient states their goals for this hospitalization and ongoing recovery are:: go back to [facility].      Discharge Placement              Patient chooses bed at: Other - please specify in the comment section below:([facility]) Patient to be transferred to facility by: [place] Name of family member notified: [name] [id number] Patient and family notified of of transfer: 02/09/20  Discharge Plan and Services   Discharge Planning Services: CM Consult Post Acute Care Choice: Home Health          DME Arranged: 3-N-1 DME Agency: [facility] Date DME Agency Contacted: 02/09/20 Time DME Agency Contacted: 1034 Representative spoke with at DME Agency: Thedore Mins HH Arranged: PT, OT [place] Agency: Other - See comment([facility]-contract w/facility for PT/OT.) Date [place]: 02/05/20 Time [place]: 1126 Representative spoke with at [place]: [place] @ [place] ([place]) Interventions     Readmission Risk Interventions Readmission Risk Prevention Plan 04/24/2019 04/21/2019  Transportation Screening - Complete  HRI or [place] - Complete  Social Work Consult for [place]  Planning/Counseling - Complete  Palliative Care Screening Not Applicable -  Some recent data might be hidden

## 2020-02-11 ENCOUNTER — Encounter: Payer: Self-pay | Admitting: Internal Medicine

## 2020-02-11 ENCOUNTER — Other Ambulatory Visit: Payer: Self-pay

## 2020-02-11 ENCOUNTER — Ambulatory Visit (INDEPENDENT_AMBULATORY_CARE_PROVIDER_SITE_OTHER): Payer: Medicare HMO | Admitting: Internal Medicine

## 2020-02-11 VITALS — BP 134/60 | HR 88 | Ht 66.0 in | Wt 199.6 lb

## 2020-02-11 DIAGNOSIS — I428 Other cardiomyopathies: Secondary | ICD-10-CM

## 2020-02-11 DIAGNOSIS — Z95810 Presence of automatic (implantable) cardiac defibrillator: Secondary | ICD-10-CM | POA: Diagnosis not present

## 2020-02-11 DIAGNOSIS — I442 Atrioventricular block, complete: Secondary | ICD-10-CM

## 2020-02-11 DIAGNOSIS — I10 Essential (primary) hypertension: Secondary | ICD-10-CM | POA: Diagnosis not present

## 2020-02-11 DIAGNOSIS — I5042 Chronic combined systolic (congestive) and diastolic (congestive) heart failure: Secondary | ICD-10-CM | POA: Diagnosis not present

## 2020-02-11 NOTE — Progress Notes (Signed)
Shelby Hausen, PA: Primary Cardiologist:  Dr Telford Nab [name] is a 86 y.o. female with a h/o bradycardia sp ICD (MDT) in [place] by Dr Deno Etienne for nonischemic CM (EF 40%) 09/2012 who presents today to establish care in the Electrophysiology device clinic.  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 She has not had device therapy (primary prevention).  She has complete heart block and is dependant today.  She walks with a rolling walker and is in an assisted living facility.  She is not very active.  She has SOB with minimal activity.  Today, she  denies symptoms of palpitations, chest pain, dizziness, presyncope, syncope, or neurologic sequela.  The patientis tolerating medications without difficulties and is otherwise without complaint today.   Past Medical History:  Diagnosis Date  . Arthritis    "comes and goes" (03/01/2015)  . Asthma   . CHF (congestive heart failure) ([place])   . Colon cancer ([place])   . Colostomy care ([place])   . COPD (chronic obstructive pulmonary disease) ([place])   . Diabetes mellitus without complication ([place])    pt denies this hx on 03/01/2015  . HCAP (healthcare-associated pneumonia) 04/22/2017  . History of blood transfusion 03/01/2015; 04/23/2017   "related to anemia,"  . Hyperlipidemia   . Hypertension   . Hypothyroidism   . Presence of combination internal cardiac defibrillator (ICD) and pacemaker    2005 in [place] MDT  . Pulmonary embolism [facility])    Past Surgical History:  Procedure Laterality Date  . ABDOMINAL HYSTERECTOMY    . ABDOMINOPERINEAL PROCTOCOLECTOMY  2006   Rectal cancer  . ANKLE FRACTURE SURGERY Left   . CARDIAC CATHETERIZATION N/A 03/22/2015   Procedure: Right/Left Heart Cath and Coronary Angiography;  Surgeon: Leonie Man, MD;  Location: [facility];  Service: Cardiovascular;  Laterality: N/A;  . CATARACT EXTRACTION W/ INTRAOCULAR LENS  IMPLANT,  BILATERAL Bilateral   . CHOLECYSTECTOMY    . DILATION AND CURETTAGE OF UTERUS    . FRACTURE SURGERY    . PARASTOMAL HERNIA REPAIR  2007   lysis of adhesions  . TUBAL LIGATION      Social History   Socioeconomic History  . Marital status: Divorced    Spouse name: Not on file  . Number of children: Not on file  . Years of education: Not on file  . Highest education level: Not on file  Occupational History  . Occupation: Retired Psychologist, counselling  Tobacco Use  . Smoking status: Never Smoker  . Smokeless tobacco: Never Used  Substance and Sexual Activity  . Alcohol use: No  . Drug use: No  . Sexual activity: Never  Other Topics Concern  . Not on file  Social History Narrative   12/19 was a resident of assisted living.  Three sons.  Divorced.   Social Determinants of Health   Financial Resource Strain:   . Difficulty of Paying Living Expenses:   Food Insecurity:   . Worried About Charity fundraiser in the Last Year:   . Arboriculturist in the Last Year:   Transportation Needs:   . Film/video editor (Medical):   Marland Kitchen Lack of Transportation (Non-Medical):   Physical Activity:   . Days of Exercise per Week:   . Minutes of Exercise per Session:   Stress:   . Feeling of Stress :  Social Connections:   . Frequency of Communication with Friends and Family:   . Frequency of Social Gatherings with Friends and Family:   . Attends Religious Services:   . Active Member of Clubs or Organizations:   . Attends Archivist Meetings:   Marland Kitchen Marital Status:   Intimate Partner Violence:   . Fear of Current or Ex-Partner:   . Emotionally Abused:   Marland Kitchen Physically Abused:   . Sexually Abused:     Family History  Problem Relation Age of Onset  . CAD Other   . Hypertension Other   . Stroke Other   . Heart disease Mother        No details  . Colon cancer Neg Hx   . GI Bleed Neg Hx     No Known Allergies  Current Outpatient Medications  Medication Sig Dispense  Refill  . acetaminophen (TYLENOL) 500 MG tablet Take 1,000 mg by mouth in the morning and at bedtime.     Marland Kitchen alum & mag hydroxide-simeth (MAALOX/MYLANTA) 200-200-20 MG/5ML suspension Take 30 mLs by mouth as needed for indigestion or heartburn (do not exceed 4 doses in 24hrs).    Marland Kitchen aspirin EC 81 MG tablet Take 1 tablet (81 mg total) by mouth every morning.    Marland Kitchen atorvastatin (LIPITOR) 20 MG tablet Take 20 mg by mouth at bedtime.     . Calcium Carbonate-Vitamin D 600-400 MG-UNIT tablet Take 1 tablet by mouth 2 (two) times a day.    . carboxymethylcellulose (REFRESH) 1 % ophthalmic solution Place 1 drop into both eyes 3 (three) times daily.     . carvedilol (COREG) 6.25 MG tablet Take 6.25 mg by mouth 2 (two) times daily with a meal.     . cyclobenzaprine (FLEXERIL) 5 MG tablet Take 5 mg by mouth at bedtime.    . famotidine (PEPCID) 20 MG tablet Take 1 tablet (20 mg total) by mouth daily. 30 tablet 1  . Fluticasone-Salmeterol (ADVAIR) 500-50 MCG/DOSE AEPB Inhale 1 puff into the lungs 2 (two) times daily. Rinse mouth after use    . furosemide (LASIX) 20 MG tablet Take 1 tablet (20 mg total) by mouth daily as needed for edema (weight gain from fluid). 30 tablet 11  . guaifenesin (ROBITUSSIN) 100 MG/5ML syrup Take 200 mg by mouth every 6 (six) hours as needed for cough.    . hydrALAZINE (APRESOLINE) 25 MG tablet Take 1 tablet (25 mg total) by mouth every 8 (eight) hours. 90 tablet 1  . iron polysaccharides (NIFEREX) 150 MG capsule Take 150 mg by mouth 2 (two) times daily.    . isosorbide mononitrate (IMDUR) 60 MG 24 hr tablet Take 1 tablet (60 mg total) by mouth daily. 30 tablet 1  . levothyroxine (SYNTHROID, LEVOTHROID) 50 MCG tablet Take 50 mcg by mouth daily.   2  . loperamide (IMODIUM A-D) 2 MG tablet Take 2 mg by mouth as needed for diarrhea or loose stools (do not exceed 8 doses in 24hrs).    . loratadine (CLARITIN) 10 MG tablet Take 10 mg by mouth daily.    . magnesium hydroxide (MILK OF MAGNESIA)  400 MG/5ML suspension Take 30 mLs by mouth at bedtime as needed for mild constipation.    Marland Kitchen neomycin-bacitracin-polymyxin (NEOSPORIN) [phone] ointment Apply 1 application topically as needed (skin cuts).    . ondansetron (ZOFRAN) 4 MG tablet Take 1 tablet (4 mg total) by mouth every 6 (six) hours as needed for nausea or vomiting. 12 tablet 0  .  polyethylene glycol (MIRALAX / GLYCOLAX) packet Take 17 g by mouth daily. Mix with 8 ounces of fluid and drink     . sodium chloride (OCEAN) 0.65 % SOLN nasal spray Place 1 spray into both nostrils as needed for congestion.     No current facility-administered medications for this visit.    ROS- all systems are reviewed and negative except as per HPI  Physical Exam: Vitals:   02/11/20 1045  BP: 134/60  Pulse: 88  Weight: 199 lb 9.6 oz (90.5 kg)  Height: 5\' 6"  (1.676 m)    GEN- The patient is elderly appearing, alert  Head- normocephalic, atraumatic Eyes-  Sclera clear, conjunctiva pink Ears- hearing intact Oropharynx- clear Neck- supple, no JVP Lungs-   normal work of breathing Chest- ICD pocket is well healed Heart- Regular rate and rhythm  GI- soft  Extremities- + mild edema MS- diffuse atrophy Skin- no rash or lesion Psych- euthymic mood, full affect Neuro- strength and sensation are intact  ICD interrogation- reviewed in detail today,  See PACEART report  Assessment and Plan:  1. Chronic combined systolic and diastolic dysfunction/ nonischemic CM EF 40% Recently admitted with CHF Her ICD function is normal.  Device does not have optivol capability. She is device dependant.  Given advanced age and fragility, I would be reluctant to upgrade to CRT.   We will establish remote monitoring (discussed at length with patient and caregiver today).  Follow-up with me in a year  2. HTN Stable No change required today  3. Complete heart block As above  Enroll in remote monitoring Follow-up with Dr Percival Spanish as scheduled Return  to see me in a year  Thompson Grayer MD, [place] 02/11/2020 10:53 AM

## 2020-02-11 NOTE — Patient Instructions (Signed)
Medication Instructions:  Your physician recommends that you continue on your current medications as directed. Please refer to the Current Medication list given to you today.  Labwork: None ordered.  Testing/Procedures: None ordered.  Follow-Up: Your physician wants you to follow-up in: one year with Dr. Rayann Heman.   You will receive a reminder letter in the mail two months in advance. If you don't receive a letter, please call our office to schedule the follow-up appointment.  Remote monitoring is used to monitor your ICD from home.   Device clinic will set up your remote monitoring.  DEVICE clinic [phone]  Any Other Special Instructions Will Be Listed Below (If Applicable).  If you need a refill on your cardiac medications before your next appointment, please call your pharmacy.

## 2020-02-16 ENCOUNTER — Ambulatory Visit (INDEPENDENT_AMBULATORY_CARE_PROVIDER_SITE_OTHER): Payer: Medicare HMO | Admitting: *Deleted

## 2020-02-16 DIAGNOSIS — I428 Other cardiomyopathies: Secondary | ICD-10-CM

## 2020-02-16 LAB — CUP PACEART REMOTE DEVICE CHECK
Battery Remaining Longevity: 19 mo
Battery Voltage: 2.89 V
Brady Statistic AP VP Percent: 0 %
Brady Statistic AP VS Percent: 0 %
Brady Statistic AS VP Percent: 100 %
Brady Statistic AS VS Percent: 0 %
Brady Statistic RA Percent Paced: 0 %
Brady Statistic RV Percent Paced: 100 %
Date Time Interrogation Session: 20210510151803
HighPow Impedance: 55 Ohm
Implantable Lead Implant Date: 20131216
Implantable Lead Implant Date: 20131216
Implantable Lead Location: 753859
Implantable Lead Location: 753860
Implantable Lead Model: 5076
Implantable Pulse Generator Implant Date: 20131216
Lead Channel Impedance Value: 323 Ohm
Lead Channel Impedance Value: 342 Ohm
Lead Channel Impedance Value: 380 Ohm
Lead Channel Pacing Threshold Amplitude: 0.625 V
Lead Channel Pacing Threshold Amplitude: 0.75 V
Lead Channel Pacing Threshold Pulse Width: 0.4 ms
Lead Channel Pacing Threshold Pulse Width: 0.4 ms
Lead Channel Sensing Intrinsic Amplitude: 1.75 mV
Lead Channel Sensing Intrinsic Amplitude: 1.75 mV
Lead Channel Sensing Intrinsic Amplitude: 4.375 mV
Lead Channel Sensing Intrinsic Amplitude: 5.125 mV
Lead Channel Setting Pacing Amplitude: 1.5 V
Lead Channel Setting Pacing Amplitude: 2 V
Lead Channel Setting Pacing Pulse Width: 0.4 ms
Lead Channel Setting Sensing Sensitivity: 0.3 mV

## 2020-02-16 NOTE — Telephone Encounter (Signed)
Pt sent transmission 02/16/2020

## 2020-02-17 NOTE — Progress Notes (Signed)
Remote ICD transmission.   

## 2020-03-03 ENCOUNTER — Other Ambulatory Visit: Payer: Self-pay

## 2020-03-03 ENCOUNTER — Encounter: Payer: Self-pay | Admitting: Cardiology

## 2020-03-03 ENCOUNTER — Ambulatory Visit (INDEPENDENT_AMBULATORY_CARE_PROVIDER_SITE_OTHER): Payer: Medicare HMO | Admitting: Cardiology

## 2020-03-03 VITALS — BP 146/60 | HR 88 | Temp 97.1°F | Ht 66.0 in | Wt 206.0 lb

## 2020-03-03 DIAGNOSIS — G309 Alzheimer's disease, unspecified: Secondary | ICD-10-CM

## 2020-03-03 DIAGNOSIS — I5043 Acute on chronic combined systolic (congestive) and diastolic (congestive) heart failure: Secondary | ICD-10-CM

## 2020-03-03 DIAGNOSIS — I428 Other cardiomyopathies: Secondary | ICD-10-CM

## 2020-03-03 DIAGNOSIS — J439 Emphysema, unspecified: Secondary | ICD-10-CM

## 2020-03-03 DIAGNOSIS — Z95810 Presence of automatic (implantable) cardiac defibrillator: Secondary | ICD-10-CM | POA: Diagnosis not present

## 2020-03-03 DIAGNOSIS — I10 Essential (primary) hypertension: Secondary | ICD-10-CM | POA: Diagnosis not present

## 2020-03-03 DIAGNOSIS — I110 Hypertensive heart disease with heart failure: Secondary | ICD-10-CM

## 2020-03-03 DIAGNOSIS — F0280 Dementia in other diseases classified elsewhere without behavioral disturbance: Secondary | ICD-10-CM

## 2020-03-03 DIAGNOSIS — N1831 Chronic kidney disease, stage 3a: Secondary | ICD-10-CM

## 2020-03-03 MED ORDER — FUROSEMIDE 20 MG PO TABS
ORAL_TABLET | ORAL | 11 refills | Status: DC
Start: 2020-03-03 — End: 2021-03-13

## 2020-03-03 NOTE — Assessment & Plan Note (Signed)
Discharged on lasix 20 mg daily. BMP 02/08/2011- BUN 27/SCr 1.15.  I suspect she can tolerate a higher dose of Lasix, will increase this to 40 mg MWF and 20 mg other days.

## 2020-03-03 NOTE — Assessment & Plan Note (Signed)
EF 40-45% by echo April 2021 Normal coronaries 2016- aberrant takeoff of the RCA

## 2020-03-03 NOTE — Assessment & Plan Note (Signed)
EF 40-45% with grade 3 DD by echo April 2021

## 2020-03-03 NOTE — Patient Instructions (Signed)
Medication Instructions:   Change Lasix---take 40 mg every Monday, Wednesday, and Friday. Take 20 mg all other days.  *If you need a refill on your cardiac medications before your next appointment, please call your pharmacy*   Lab Work: Your physician recommends that you have lab work in 1 month: BMET, BNP  If you have labs (blood work) drawn today and your tests are completely normal, you will receive your results only by: Marland Kitchen MyChart Message (if you have MyChart) OR . A paper copy in the mail If you have any lab test that is abnormal or we need to change your treatment, we will call you to review the results.   Follow-Up: At [facility], you and your health needs are our priority.  As part of our continuing mission to provide you with exceptional heart care, we have created designated Provider Care Teams.  These Care Teams include your primary Cardiologist (physician) and Advanced Practice Providers (APPs -  Physician Assistants and Nurse Practitioners) who all work together to provide you with the care you need, when you need it.  We recommend signing up for the patient portal called "MyChart".  Sign up information is provided on this After Visit Summary.  MyChart is used to connect with patients for Virtual Visits (Telemedicine).  Patients are able to view lab/test results, encounter notes, upcoming appointments, etc.  Non-urgent messages can be sent to your provider as well.   To learn more about what you can do with MyChart, go to [email or web address].    Your next appointment:   3 month(s)  The format for your next appointment:   In Person  Provider:   Kerin Ransom, PA-C

## 2020-03-03 NOTE — Assessment & Plan Note (Signed)
MDT device implanted in [place] 2013. Dr Rayann Heman following

## 2020-03-03 NOTE — Assessment & Plan Note (Signed)
Mild by exam today in the office

## 2020-03-03 NOTE — Progress Notes (Signed)
Cardiology Office Note:    Date:  03/03/2020   ID:  [name], DOB [birth year], MRN [id number]  PCP:  [name], PA  Cardiologist:  Minus Breeding, MD  Electrophysiologist:  Thompson Grayer, MD   Referring MD: [name], [place]   Chief Complaint  Patient presents with  . Follow-up  . Shortness of Breath  . Edema    History of Present Illness:    [name] is a 86 y.o. female with a hx of mild dementia.  She is a resident of [facility].  She has a history of a nonischemic cardiomyopathy.  She had normal coronaries in 2016.  She had an ICD placed in [place] in 2013.  She has COPD and chronic renal insufficiency.  The patient is seen today as a post hospital follow-up.  She was admitted with congestive heart failure 02/03/2020 and discharged 02/07/2020.  The patient's hospital course is somewhat confusing to me.  On admission her weight was 188 pounds which is low for her.  Her serum creatinine was greater than 2 which is new for her.  She was hypertensive on admission.  She has basilar crackles in her lungs, its not clear if this is fibrosis versus heart failure.  Despite her elevated creatinine and low weight she was complaining of shortness of breath and lower extremity edema.  She did diurese 2 L during that hospitalization.  Her Lasix dose was cut back at discharge to 20 mg daily, she was previously on 40 mg a day.  Her weight at discharge was 199 pounds.  She presents to the office today for follow-up.  Dr. Rayann Heman has seen her as an outpatient and will follow her device.  The patient tells me that she feels better than she did prior to admission.  She still has swelling in her legs, she tells me that this is chronic although it seems to be at baseline.  She denies orthopnea.  She tells me her shortness of breath is improved.  Her weight today is up to 206 pounds.  She does have fine basilar crackles, these are not diffuse as one would hear with pulmonary fibrosis.  Past  Medical History:  Diagnosis Date  . Arthritis    "comes and goes" (03/01/2015)  . Asthma   . CHF (congestive heart failure) ([place])   . Colon cancer ([place])   . Colostomy care ([place])   . Complete heart block ([place])   . COPD (chronic obstructive pulmonary disease) ([place])   . Diabetes mellitus without complication ([place])    pt denies this hx on 03/01/2015  . HCAP (healthcare-associated pneumonia) 04/22/2017  . History of blood transfusion 03/01/2015; 04/23/2017   "related to anemia,"  . Hyperlipidemia   . Hypertension   . Hypothyroidism   . Pulmonary embolism [facility])     Past Surgical History:  Procedure Laterality Date  . ABDOMINAL HYSTERECTOMY    . ABDOMINOPERINEAL PROCTOCOLECTOMY  2006   Rectal cancer  . ANKLE FRACTURE SURGERY Left   . CARDIAC CATHETERIZATION N/A 03/22/2015   Procedure: Right/Left Heart Cath and Coronary Angiography;  Surgeon: Leonie Man, MD;  Location: [facility];  Service: Cardiovascular;  Laterality: N/A;  . CATARACT EXTRACTION W/ INTRAOCULAR LENS  IMPLANT, BILATERAL Bilateral   . CHOLECYSTECTOMY    . DILATION AND CURETTAGE OF UTERUS    . FRACTURE SURGERY    . ICD IMPLANT  09/23/2012   MDT, implanted for primary prevention by Dr Deno Etienne in [facility]  .  PARASTOMAL HERNIA REPAIR  2007   lysis of adhesions  . TUBAL LIGATION      Current Medications: Current Meds  Medication Sig  . acetaminophen (TYLENOL) 500 MG tablet Take 1,000 mg by mouth in the morning and at bedtime.   Marland Kitchen alum & mag hydroxide-simeth (MAALOX/MYLANTA) 200-200-20 MG/5ML suspension Take 30 mLs by mouth as needed for indigestion or heartburn (do not exceed 4 doses in 24hrs).  Marland Kitchen aspirin EC 81 MG tablet Take 1 tablet (81 mg total) by mouth every morning.  Marland Kitchen atorvastatin (LIPITOR) 20 MG tablet Take 20 mg by mouth at bedtime.   . Calcium Carbonate-Vitamin D 600-400 MG-UNIT tablet Take 1 tablet by mouth 2 (two) times a day.  . carboxymethylcellulose (REFRESH) 1 % ophthalmic solution Place 1  drop into both eyes 3 (three) times daily.   . carvedilol (COREG) 6.25 MG tablet Take 6.25 mg by mouth 2 (two) times daily with a meal.   . cyclobenzaprine (FLEXERIL) 5 MG tablet Take 5 mg by mouth at bedtime.  . famotidine (PEPCID) 20 MG tablet Take 1 tablet (20 mg total) by mouth daily.  . Fluticasone-Salmeterol (ADVAIR) 500-50 MCG/DOSE AEPB Inhale 1 puff into the lungs 2 (two) times daily. Rinse mouth after use  . furosemide (LASIX) 20 MG tablet Take 40 mg (2 tab) by mouth every Monday, Wednesday, and Friday. Take 20 mg (1 tab) all other days.  Marland Kitchen guaifenesin (ROBITUSSIN) 100 MG/5ML syrup Take 200 mg by mouth every 6 (six) hours as needed for cough.  . hydrALAZINE (APRESOLINE) 25 MG tablet Take 1 tablet (25 mg total) by mouth every 8 (eight) hours.  . iron polysaccharides (NIFEREX) 150 MG capsule Take 150 mg by mouth 2 (two) times daily.  . isosorbide mononitrate (IMDUR) 60 MG 24 hr tablet Take 1 tablet (60 mg total) by mouth daily.  Marland Kitchen levothyroxine (SYNTHROID, LEVOTHROID) 50 MCG tablet Take 50 mcg by mouth daily.   Marland Kitchen loperamide (IMODIUM A-D) 2 MG tablet Take 2 mg by mouth as needed for diarrhea or loose stools (do not exceed 8 doses in 24hrs).  . loratadine (CLARITIN) 10 MG tablet Take 10 mg by mouth daily.  . magnesium hydroxide (MILK OF MAGNESIA) 400 MG/5ML suspension Take 30 mLs by mouth at bedtime as needed for mild constipation.  Marland Kitchen neomycin-bacitracin-polymyxin (NEOSPORIN) [phone] ointment Apply 1 application topically as needed (skin cuts).  . ondansetron (ZOFRAN) 4 MG tablet Take 1 tablet (4 mg total) by mouth every 6 (six) hours as needed for nausea or vomiting.  . polyethylene glycol (MIRALAX / GLYCOLAX) packet Take 17 g by mouth daily. Mix with 8 ounces of fluid and drink   . sodium chloride (OCEAN) 0.65 % SOLN nasal spray Place 1 spray into both nostrils as needed for congestion.  . [DISCONTINUED] furosemide (LASIX) 20 MG tablet Take 1 tablet (20 mg total) by mouth daily as needed  for edema (weight gain from fluid).     Allergies:   Patient has no known allergies.   Social History   Socioeconomic History  . Marital status: Divorced    Spouse name: Not on file  . Number of children: Not on file  . Years of education: Not on file  . Highest education level: Not on file  Occupational History  . Occupation: Retired Psychologist, counselling  Tobacco Use  . Smoking status: Never Smoker  . Smokeless tobacco: Never Used  Substance and Sexual Activity  . Alcohol use: No  . Drug use: No  . Sexual  activity: Never  Other Topics Concern  . Not on file  Social History Narrative   12/19 was a resident of assisted living.  Three sons.  Divorced.   Social Determinants of Health   Financial Resource Strain:   . Difficulty of Paying Living Expenses:   Food Insecurity:   . Worried About Charity fundraiser in the Last Year:   . Arboriculturist in the Last Year:   Transportation Needs:   . Film/video editor (Medical):   Marland Kitchen Lack of Transportation (Non-Medical):   Physical Activity:   . Days of Exercise per Week:   . Minutes of Exercise per Session:   Stress:   . Feeling of Stress :   Social Connections:   . Frequency of Communication with Friends and Family:   . Frequency of Social Gatherings with Friends and Family:   . Attends Religious Services:   . Active Member of Clubs or Organizations:   . Attends Archivist Meetings:   Marland Kitchen Marital Status:      Family History: The patient's family history includes CAD in an other family member; Heart disease in her mother; Hypertension in an other family member; Stroke in an other family member. There is no history of Colon cancer or GI Bleed.  ROS:   Please see the history of present illness.     All other systems reviewed and are negative.  EKGs/Labs/Other Studies Reviewed:    The following studies were reviewed today:  Echo 02/03/2020- 1. Technically difficult; definity used; mild to moderate global   reduction in LV systolic function; restrictive filling; mild LVH; mild AS;  mild AI; mild MR; moderate LAE.  2. Left ventricular ejection fraction, by estimation, is 40 to 45%. The  left ventricle has mildly decreased function. The left ventricle  demonstrates global hypokinesis. There is mild left ventricular  hypertrophy. Left ventricular diastolic parameters  are consistent with Grade III diastolic dysfunction (restrictive).  3. Right ventricular systolic function is normal. The right ventricular  size is normal. There is normal pulmonary artery systolic pressure.  4. Left atrial size was moderately dilated.  5. The mitral valve is normal in structure. Mild mitral valve  regurgitation. No evidence of mitral stenosis.  6. The aortic valve is abnormal. Aortic valve regurgitation is mild. Mild  aortic valve stenosis.   EKG:  EKG is not ordered today.  The ekg ordered  demonstrates paced rhythm   Recent Labs: 04/19/2019: TSH 1.118 04/22/2019: Magnesium 2.1 02/03/2020: ALT 44; B Natriuretic Peptide 968.1 02/04/2020: Hemoglobin 10.7; Platelets 211 02/08/2020: BUN 27; Creatinine, Ser 1.15; Potassium 4.6; Sodium 138  Recent Lipid Panel    Component Value Date/Time   CHOL 117 10/28/2017 1542   TRIG 41 10/28/2017 1542   HDL 46 10/28/2017 1542   CHOLHDL 2.5 10/28/2017 1542   VLDL 8 10/28/2017 1542   LDLCALC 63 10/28/2017 1542    Physical Exam:    VS:  BP (!) 146/60 (BP Location: Left Arm, Patient Position: Sitting, Cuff Size: Large)   Pulse 88   Temp (!) 97.1 F (36.2 C)   Ht 5\' 6"  (1.676 m)   Wt 206 lb (93.4 kg)   BMI 33.25 kg/m     Wt Readings from Last 3 Encounters:  03/03/20 206 lb (93.4 kg)  02/11/20 199 lb 9.6 oz (90.5 kg)  02/08/20 203 lb 0.7 oz (92.1 kg)     GEN: Well nourished, well developed AA female in no acute distress HEENT: Normal  NECK: No JVD; No carotid bruits CARDIAC: RRR, 2/6 systolic murmur AOV and LSB, no rubs, gallops RESPIRATORY:  Fine basilar  crackles ABDOMEN: Soft, non-tender, non-distended MUSCULOSKELETAL:  Bilateral 1-2 + LE edema; No deformity  SKIN: Warm and dry NEUROLOGIC:  Alert and oriented x 3 PSYCHIATRIC:  Normal affect   ASSESSMENT:    Acute on chronic combined systolic and diastolic CHF  Admitted [id number]. EF 40-45%. DC weight 199 lbs. 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   CKD (chronic kidney disease) stage 3, GFR 30-59 ml/min Discharged on lasix 20 mg daily. BMP 02/08/2011- BUN 27/SCr 1.15.  I suspect she can tolerate a higher dose of Lasix, will increase this to 40 mg MWF and 20 mg other days.   Dementia ([place]) Mild by exam today in the office  ICD in place MDT device implanted in [facility] 2013. Dr Rayann Heman following   NICM (nonischemic cardiomyopathy) ([place]) EF 40-45% by echo April 2021 Normal coronaries 2016- aberrant takeoff of the RCA  Hypertensive heart disease EF 40-45% with grade 3 DD by echo April 2021  PLAN:    Increase Lasix to 40 mg MWF- 20mg  other days.  BMP/BNP in 4 weeks. F/U with me in 3 months.    Medication Adjustments/Labs and Tests Ordered: Current medicines are reviewed at length with the patient today.  Concerns regarding medicines are outlined above.  Orders Placed This Encounter  Procedures  . Basic metabolic panel  . Brain natriuretic peptide   Meds ordered this encounter  Medications  . furosemide (LASIX) 20 MG tablet    Sig: Take 40 mg (2 tab) by mouth every Monday, Wednesday, and Friday. Take 20 mg (1 tab) all other days.    Dispense:  30 tablet    Refill:  11    Patient Instructions  Medication Instructions:   Change Lasix---take 40 mg every Monday, Wednesday, and Friday. Take 20 mg all other days.  *If you need a refill on your cardiac medications before your next appointment, please call your pharmacy*   Lab Work: Your physician recommends that you have lab work in 1 month:  BMET, BNP  If you have labs (blood work) drawn today and your tests are completely normal, you will receive your results only by: Marland Kitchen MyChart Message (if you have MyChart) OR . A paper copy in the mail If you have any lab test that is abnormal or we need to change your treatment, we will call you to review the results.   Follow-Up: At [facility], you and your health needs are our priority.  As part of our continuing mission to provide you with exceptional heart care, we have created designated Provider Care Teams.  These Care Teams include your primary Cardiologist (physician) and Advanced Practice Providers (APPs -  Physician Assistants and Nurse Practitioners) who all work together to provide you with the care you need, when you need it.  We recommend signing up for the patient portal called "MyChart".  Sign up information is provided on this After Visit Summary.  MyChart is used to connect with patients for Virtual Visits (Telemedicine).  Patients are able to view lab/test results, encounter notes, upcoming appointments, etc.  Non-urgent messages can be sent to your provider as well.   To learn more about what you can do with MyChart, go to [email or web address].    Your next appointment:   3 month(s)  The format for  your next appointment:   In Person  Provider:   Kerin Ransom, PA-C      Signed, Kerin Ransom, PA-C  03/03/2020 9:13 AM    [place]

## 2020-03-03 NOTE — Assessment & Plan Note (Signed)
Admitted 4/27-5/10/2019. EF 40-45%. DC weight 199 lbs. 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

## 2020-05-10 ENCOUNTER — Encounter: Payer: Self-pay | Admitting: Cardiology

## 2020-05-10 ENCOUNTER — Other Ambulatory Visit: Payer: Self-pay

## 2020-05-10 ENCOUNTER — Ambulatory Visit (INDEPENDENT_AMBULATORY_CARE_PROVIDER_SITE_OTHER): Payer: Medicare HMO | Admitting: Cardiology

## 2020-05-10 VITALS — BP 149/73 | HR 92 | Ht 66.0 in | Wt 205.8 lb

## 2020-05-10 DIAGNOSIS — I428 Other cardiomyopathies: Secondary | ICD-10-CM

## 2020-05-10 DIAGNOSIS — N1831 Chronic kidney disease, stage 3a: Secondary | ICD-10-CM

## 2020-05-10 DIAGNOSIS — I5032 Chronic diastolic (congestive) heart failure: Secondary | ICD-10-CM | POA: Diagnosis not present

## 2020-05-10 DIAGNOSIS — Z95810 Presence of automatic (implantable) cardiac defibrillator: Secondary | ICD-10-CM

## 2020-05-10 DIAGNOSIS — I5042 Chronic combined systolic (congestive) and diastolic (congestive) heart failure: Secondary | ICD-10-CM

## 2020-05-10 NOTE — Assessment & Plan Note (Addendum)
Her baseline SCr may be closer to 2.0, will see what her labs look like today

## 2020-05-10 NOTE — Assessment & Plan Note (Signed)
EF 40-45% by echo April 2021 Normal coronaries 2016- aberrant takeoff of the

## 2020-05-10 NOTE — Assessment & Plan Note (Signed)
I think she may still be volume overloaded- check BMP and BNP -increase Lasix pending these results.

## 2020-05-10 NOTE — Assessment & Plan Note (Signed)
MDT device implanted in [facility] 2013

## 2020-05-10 NOTE — Patient Instructions (Signed)
Medication Instructions:  Your physician recommends that you continue on your current medications as directed. Please refer to the Current Medication list given to you today.  *If you need a refill on your cardiac medications before your next appointment, please call your pharmacy*   Lab Work: Your physician recommends that you return for lab work today: BMET, BNP  If you have labs (blood work) drawn today and your tests are completely normal, you will receive your results only by: Marland Kitchen MyChart Message (if you have MyChart) OR . A paper copy in the mail If you have any lab test that is abnormal or we need to change your treatment, we will call you to review the results.  Follow-Up: At [facility], you and your health needs are our priority.  As part of our continuing mission to provide you with exceptional heart care, we have created designated Provider Care Teams.  These Care Teams include your primary Cardiologist (physician) and Advanced Practice Providers (APPs -  Physician Assistants and Nurse Practitioners) who all work together to provide you with the care you need, when you need it.  We recommend signing up for the patient portal called "MyChart".  Sign up information is provided on this After Visit Summary.  MyChart is used to connect with patients for Virtual Visits (Telemedicine).  Patients are able to view lab/test results, encounter notes, upcoming appointments, etc.  Non-urgent messages can be sent to your provider as well.   To learn more about what you can do with MyChart, go to [email or web address].    Your next appointment:   We will call you with your lab results and let you know when to schedule a follow-up appointment.

## 2020-05-10 NOTE — Progress Notes (Signed)
Cardiology Office Note:    Date:  05/10/2020   ID:  [name], DOB 08-Dec-1933, MRN [id number]  PCP:  [name], PA  Cardiologist:  Minus Breeding, MD  Electrophysiologist:  Thompson Grayer, MD   Referring MD: [name], PA   No chief complaint on file.   History of Present Illness:    [name] is a 86 y.o. female with a hx of mild dementia.  She is a resident of [facility].  She has a history of a nonischemic cardiomyopathy.  She had normal coronaries in 2016.  She had an ICD placed in [place] in 2013.  She has COPD and chronic renal insufficiency.    She was admitted with congestive heart failure 02/03/2020 and discharged 02/07/2020.  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  I saw her in follow up 5/26,2021 Her weight was 206 lbs (adm weight was listed as 188 lbs which I think was inaccurate). I increased her Lasix from 20 mg daily to $0 mg MWF and 20 mg other days.  She was supposed to have a BMP in f/u but this was never done.  She presents today for follow up.  She denies any increase SOB.  She has chronic LE edema and c/o puffy edema in her arms and wrist.  She has an electric bed and sleep with the head of the bed elevated some.   Past Medical History:  Diagnosis Date  . Arthritis    "comes and goes" (03/01/2015)  . Asthma   . CHF (congestive heart failure) ([place])   . Colon cancer ([place])   . Colostomy care ([place])   . Complete heart block ([place])   . COPD (chronic obstructive pulmonary disease) ([place])   . Diabetes mellitus without complication ([place])    pt denies this hx on 03/01/2015  . HCAP (healthcare-associated pneumonia) 04/22/2017  . History of blood transfusion 03/01/2015; 04/23/2017   "related to anemia,"  . Hyperlipidemia   . Hypertension   . Hypothyroidism   . Pulmonary embolism [facility])     Past Surgical History:  Procedure Laterality Date  . ABDOMINAL HYSTERECTOMY    .  ABDOMINOPERINEAL PROCTOCOLECTOMY  2006   Rectal cancer  . ANKLE FRACTURE SURGERY Left   . CARDIAC CATHETERIZATION N/A 03/22/2015   Procedure: Right/Left Heart Cath and Coronary Angiography;  Surgeon: Leonie Man, MD;  Location: [facility] [place] [facility];  Service: Cardiovascular;  Laterality: N/A;  . CATARACT EXTRACTION W/ INTRAOCULAR LENS  IMPLANT, BILATERAL Bilateral   . CHOLECYSTECTOMY    . DILATION AND CURETTAGE OF UTERUS    . FRACTURE SURGERY    . ICD IMPLANT  09/23/2012   MDT, implanted for primary prevention by Dr Deno Etienne [facility]  . PARASTOMAL HERNIA REPAIR  2007   lysis of adhesions  . TUBAL LIGATION      Current Medications: Current Meds  Medication Sig  . acetaminophen (TYLENOL) 500 MG tablet Take 1,000 mg by mouth in the morning and at bedtime.   Marland Kitchen alum & mag hydroxide-simeth (MAALOX/MYLANTA) 200-200-20 MG/5ML suspension Take 30 mLs by mouth as needed for indigestion or heartburn (do not exceed 4 doses in 24hrs).  Marland Kitchen aspirin EC 81 MG tablet Take 1 tablet (81 mg total) by mouth every morning.  Marland Kitchen atorvastatin (LIPITOR) 20 MG tablet Take 20 mg by mouth at bedtime.   . Calcium Carbonate-Vitamin D 600-400 MG-UNIT tablet Take  1 tablet by mouth 2 (two) times a day.  . carboxymethylcellulose (REFRESH) 1 % ophthalmic solution Place 1 drop into both eyes 3 (three) times daily.   . carvedilol (COREG) 6.25 MG tablet Take 6.25 mg by mouth 2 (two) times daily with a meal.   . cyclobenzaprine (FLEXERIL) 5 MG tablet Take 5 mg by mouth at bedtime.  . famotidine (PEPCID) 20 MG tablet Take 1 tablet (20 mg total) by mouth daily.  . Fluticasone-Salmeterol (ADVAIR) 500-50 MCG/DOSE AEPB Inhale 1 puff into the lungs 2 (two) times daily. Rinse mouth after use  . furosemide (LASIX) 20 MG tablet Take 40 mg (2 tab) by mouth every Monday, Wednesday, and Friday. Take 20 mg (1 tab) all other days.  Marland Kitchen guaifenesin (ROBITUSSIN) 100 MG/5ML syrup Take 200 mg by mouth every 6 (six) hours as needed for cough.   . hydrALAZINE (APRESOLINE) 25 MG tablet Take 1 tablet (25 mg total) by mouth every 8 (eight) hours.  . iron polysaccharides (NIFEREX) 150 MG capsule Take 150 mg by mouth 2 (two) times daily.  . isosorbide mononitrate (IMDUR) 60 MG 24 hr tablet Take 1 tablet (60 mg total) by mouth daily.  Marland Kitchen levothyroxine (SYNTHROID, LEVOTHROID) 50 MCG tablet Take 50 mcg by mouth daily.   Marland Kitchen loperamide (IMODIUM A-D) 2 MG tablet Take 2 mg by mouth as needed for diarrhea or loose stools (do not exceed 8 doses in 24hrs).  . loratadine (CLARITIN) 10 MG tablet Take 10 mg by mouth daily.  . magnesium hydroxide (MILK OF MAGNESIA) 400 MG/5ML suspension Take 30 mLs by mouth at bedtime as needed for mild constipation.  Marland Kitchen neomycin-bacitracin-polymyxin (NEOSPORIN) [phone] ointment Apply 1 application topically as needed (skin cuts).  . ondansetron (ZOFRAN) 4 MG tablet Take 1 tablet (4 mg total) by mouth every 6 (six) hours as needed for nausea or vomiting.  . polyethylene glycol (MIRALAX / GLYCOLAX) packet Take 17 g by mouth daily. Mix with 8 ounces of fluid and drink   . sodium chloride (OCEAN) 0.65 % SOLN nasal spray Place 1 spray into both nostrils as needed for congestion.     Allergies:   Patient has no known allergies.   Social History   Socioeconomic History  . Marital status: Divorced    Spouse name: Not on file  . Number of children: Not on file  . Years of education: Not on file  . Highest education level: Not on file  Occupational History  . Occupation: Retired Psychologist, counselling  Tobacco Use  . Smoking status: Never Smoker  . Smokeless tobacco: Never Used  Substance and Sexual Activity  . Alcohol use: No  . Drug use: No  . Sexual activity: Never  Other Topics Concern  . Not on file  Social History Narrative   12/19 was a resident of assisted living.  Three sons.  Divorced.   Social Determinants of Health   Financial Resource Strain:   . Difficulty of Paying Living Expenses:   Food  Insecurity:   . Worried About Charity fundraiser in the Last Year:   . Arboriculturist in the Last Year:   Transportation Needs:   . Film/video editor (Medical):   Marland Kitchen Lack of Transportation (Non-Medical):   Physical Activity:   . Days of Exercise per Week:   . Minutes of Exercise per Session:   Stress:   . Feeling of Stress :   Social Connections:   . Frequency of Communication with Friends and Family:   .  Frequency of Social Gatherings with Friends and Family:   . Attends Religious Services:   . Active Member of Clubs or Organizations:   . Attends Archivist Meetings:   Marland Kitchen Marital Status:      Family History: The patient's family history includes CAD in an other family member; Heart disease in her mother; Hypertension in an other family member; Stroke in an other family member. There is no history of Colon cancer or GI Bleed.  ROS:   Please see the history of present illness.     All other systems reviewed and are negative.  EKGs/Labs/Other Studies Reviewed:    The following studies were reviewed today:  Echo 02/03/2020- IMPRESSIONS   1. Technically difficult; definity used; mild to moderate global  reduction in LV systolic function; restrictive filling; mild LVH; mild AS;  mild AI; mild MR; moderate LAE.  2. Left ventricular ejection fraction, by estimation, is 40 to 45%. The  left ventricle has mildly decreased function. The left ventricle  demonstrates global hypokinesis. There is mild left ventricular  hypertrophy. Left ventricular diastolic parameters  are consistent with Grade III diastolic dysfunction (restrictive).  3. Right ventricular systolic function is normal. The right ventricular  size is normal. There is normal pulmonary artery systolic pressure.  4. Left atrial size was moderately dilated.  5. The mitral valve is normal in structure. Mild mitral valve  regurgitation. No evidence of mitral stenosis.  6. The aortic valve is abnormal.  Aortic valve regurgitation is mild. Mild  aortic valve stenosis.   EKG:  EKG is not ordered today.  The ekg ordered 02/03/2020 demonstrates paced rhythm.   Recent Labs: 02/03/2020: ALT 44; B Natriuretic Peptide 968.1 02/04/2020: Hemoglobin 10.7; Platelets 211 02/08/2020: BUN 27; Creatinine, Ser 1.15; Potassium 4.6; Sodium 138  Recent Lipid Panel    Component Value Date/Time   CHOL 117 10/28/2017 1542   TRIG 41 10/28/2017 1542   HDL 46 10/28/2017 1542   CHOLHDL 2.5 10/28/2017 1542   VLDL 8 10/28/2017 1542   LDLCALC 63 10/28/2017 1542    Physical Exam:    VS:  BP (!) 149/73   Pulse 92   Ht 5\' 6"  (1.676 m)   Wt (!) 205 lb 12.8 oz (93.4 kg)   SpO2 99%   BMI 33.22 kg/m     Wt Readings from Last 3 Encounters:  05/10/20 (!) 205 lb 12.8 oz (93.4 kg)  03/03/20 206 lb (93.4 kg)  02/11/20 199 lb 9.6 oz (90.5 kg)     GEN:  Well nourished, well developed AA female presents in wheelchair in no acute distress HEENT: Normal NECK: No JVD; No carotid bruits LYMPHATICS: No lymphadenopathy CARDIAC: RRR, 2/6 mid systolic murmur LSB and AOV area, no rubs, gallops RESPIRATORY:  Bi basilar crackles, Rt > Lt ABDOMEN: Soft, non-tender, non-distended MUSCULOSKELETAL:  Support stockings in place on LE - trace edema; No deformity  SKIN: Warm and dry NEUROLOGIC:  Alert and oriented x 3 PSYCHIATRIC:  Normal affect   ASSESSMENT:    Chronic combined systolic and diastolic CHF (congestive heart failure) (HCC) I think she may still be volume overloaded- check BMP and BNP -increase Lasix pending these results.   CKD (chronic kidney disease) stage 3, GFR 30-59 ml/min Her baseline SCr may be closer to 2.0, will see what her labs look like today  NICM (nonischemic cardiomyopathy) ([place]) EF 40-45% by echo April 2021 Normal coronaries 2016- aberrant takeoff of the   ICD in place MDT device implanted in  [place] 2013  PLAN:    Check BMP and BNP- increase Lasix depending on labs- f/u 3 months.     Medication Adjustments/Labs and Tests Ordered: Current medicines are reviewed at length with the patient today.  Concerns regarding medicines are outlined above.  Orders Placed This Encounter  Procedures  . Basic metabolic panel  . Brain natriuretic peptide   No orders of the defined types were placed in this encounter.   Patient Instructions  Medication Instructions:  Your physician recommends that you continue on your current medications as directed. Please refer to the Current Medication list given to you today.  *If you need a refill on your cardiac medications before your next appointment, please call your pharmacy*   Lab Work: Your physician recommends that you return for lab work today: BMET, BNP  If you have labs (blood work) drawn today and your tests are completely normal, you will receive your results only by: Marland Kitchen MyChart Message (if you have MyChart) OR . A paper copy in the mail If you have any lab test that is abnormal or we need to change your treatment, we will call you to review the results.  Follow-Up: At [facility], you and your health needs are our priority.  As part of our continuing mission to provide you with exceptional heart care, we have created designated Provider Care Teams.  These Care Teams include your primary Cardiologist (physician) and Advanced Practice Providers (APPs -  Physician Assistants and Nurse Practitioners) who all work together to provide you with the care you need, when you need it.  We recommend signing up for the patient portal called "MyChart".  Sign up information is provided on this After Visit Summary.  MyChart is used to connect with patients for Virtual Visits (Telemedicine).  Patients are able to view lab/test results, encounter notes, upcoming appointments, etc.  Non-urgent messages can be sent to your provider as well.   To learn more about what you can do with MyChart, go to [email or web address].    Your next  appointment:   We will call you with your lab results and let you know when to schedule a follow-up appointment.      Angelena Form, PA-C  05/10/2020 9:36 AM    [facility]

## 2020-05-11 LAB — BASIC METABOLIC PANEL
BUN/Creatinine Ratio: 22 (ref 12–28)
BUN: 26 mg/dL (ref 8–27)
CO2: 23 mmol/L (ref 20–29)
Calcium: 10.3 mg/dL (ref 8.7–10.3)
Chloride: 101 mmol/L (ref 96–106)
Creatinine, Ser: 1.19 mg/dL — ABNORMAL HIGH (ref 0.57–1.00)
GFR calc Af Amer: 48 mL/min/{1.73_m2} — ABNORMAL LOW (ref >59)
GFR calc non Af Amer: 42 mL/min/{1.73_m2} — ABNORMAL LOW (ref >59)
Glucose: 75 mg/dL (ref 65–99)
Potassium: 4.4 mmol/L (ref 3.5–5.2)
Sodium: 140 mmol/L (ref 134–144)

## 2020-05-11 LAB — BRAIN NATRIURETIC PEPTIDE: BNP: 309.6 pg/mL — ABNORMAL HIGH (ref 0.0–100.0)

## 2020-05-12 ENCOUNTER — Ambulatory Visit (INDEPENDENT_AMBULATORY_CARE_PROVIDER_SITE_OTHER): Payer: Medicare HMO | Admitting: *Deleted

## 2020-05-12 DIAGNOSIS — I428 Other cardiomyopathies: Secondary | ICD-10-CM | POA: Diagnosis not present

## 2020-05-12 LAB — CUP PACEART REMOTE DEVICE CHECK
Battery Remaining Longevity: 16 mo
Battery Voltage: 2.89 V
Brady Statistic AP VP Percent: 0.01 %
Brady Statistic AP VS Percent: 0 %
Brady Statistic AS VP Percent: 99.87 %
Brady Statistic AS VS Percent: 0.12 %
Brady Statistic RA Percent Paced: 0.01 %
Brady Statistic RV Percent Paced: 99.89 %
Date Time Interrogation Session: 20210804044222
HighPow Impedance: 53 Ohm
Implantable Lead Implant Date: 20131216
Implantable Lead Implant Date: 20131216
Implantable Lead Location: 753859
Implantable Lead Location: 753860
Implantable Lead Model: 5076
Implantable Pulse Generator Implant Date: 20131216
Lead Channel Impedance Value: 304 Ohm
Lead Channel Impedance Value: 323 Ohm
Lead Channel Impedance Value: 380 Ohm
Lead Channel Pacing Threshold Amplitude: 0.5 V
Lead Channel Pacing Threshold Amplitude: 0.75 V
Lead Channel Pacing Threshold Pulse Width: 0.4 ms
Lead Channel Pacing Threshold Pulse Width: 0.4 ms
Lead Channel Sensing Intrinsic Amplitude: 1.875 mV
Lead Channel Sensing Intrinsic Amplitude: 1.875 mV
Lead Channel Sensing Intrinsic Amplitude: 4.375 mV
Lead Channel Sensing Intrinsic Amplitude: 5.125 mV
Lead Channel Setting Pacing Amplitude: 1.5 V
Lead Channel Setting Pacing Amplitude: 2 V
Lead Channel Setting Pacing Pulse Width: 0.4 ms
Lead Channel Setting Sensing Sensitivity: 0.3 mV

## 2020-05-13 NOTE — Progress Notes (Signed)
Remote ICD transmission.   

## 2020-05-20 ENCOUNTER — Telehealth: Payer: Self-pay | Admitting: Cardiology

## 2020-05-20 NOTE — Telephone Encounter (Signed)
Pt c/o medication issue:  1. Name of Medication: furosemide (LASIX) 20 MG tablet  2. How are you currently taking this medication (dosage and times per day)? As directed  3. Are you having a reaction (difficulty breathing--STAT)? no  4. What is your medication issue? Patient's PA Melany Guernsey called to confirm if the patient will need to adjust the dosage of this medication. The visit notes from 05/10/20 mention that the medication may be changed as a result of lab work. When the patient was in to see her PA, the patient was not sure if the dose had changed yet.  Please call the PA Melany Guernsey to discuss the medication. The phone number provided is the PA's personal cell phone

## 2020-05-20 NOTE — Telephone Encounter (Signed)
Received a phone call from Melany Guernsey from [place] returning a phone call. Please call [phone] Ext [phone]

## 2020-05-20 NOTE — Telephone Encounter (Signed)
Left message to call back  

## 2020-05-20 NOTE — Telephone Encounter (Signed)
Left message to call back  No changes per lab result. At OV patient: takingFurosemide 20 MG Take 40 mg (2 tab) by mouth every Monday, Wednesday, and Friday. Take 20 mg (1 tab) all other days.

## 2020-05-21 NOTE — Telephone Encounter (Signed)
Left message for [name] to call

## 2020-05-25 NOTE — Telephone Encounter (Signed)
Left message for [name] to call

## 2020-05-26 NOTE — Telephone Encounter (Signed)
Triage nurse(s) attempted return call x3 on different days with no response.

## 2020-08-11 ENCOUNTER — Ambulatory Visit (INDEPENDENT_AMBULATORY_CARE_PROVIDER_SITE_OTHER): Payer: Medicare HMO

## 2020-08-11 DIAGNOSIS — I428 Other cardiomyopathies: Secondary | ICD-10-CM | POA: Diagnosis not present

## 2020-08-11 LAB — CUP PACEART REMOTE DEVICE CHECK
Battery Remaining Longevity: 13 mo
Battery Voltage: 2.87 V
Brady Statistic AP VP Percent: 0.03 %
Brady Statistic AP VS Percent: 0 %
Brady Statistic AS VP Percent: 99.85 %
Brady Statistic AS VS Percent: 0.12 %
Brady Statistic RA Percent Paced: 0.03 %
Brady Statistic RV Percent Paced: 99.88 %
Date Time Interrogation Session: 20211103022822
HighPow Impedance: 55 Ohm
Implantable Lead Implant Date: 20131216
Implantable Lead Implant Date: 20131216
Implantable Lead Location: 753859
Implantable Lead Location: 753860
Implantable Lead Model: 5076
Implantable Pulse Generator Implant Date: 20131216
Lead Channel Impedance Value: 304 Ohm
Lead Channel Impedance Value: 323 Ohm
Lead Channel Impedance Value: 342 Ohm
Lead Channel Pacing Threshold Amplitude: 0.5 V
Lead Channel Pacing Threshold Amplitude: 0.875 V
Lead Channel Pacing Threshold Pulse Width: 0.4 ms
Lead Channel Pacing Threshold Pulse Width: 0.4 ms
Lead Channel Sensing Intrinsic Amplitude: 1.75 mV
Lead Channel Sensing Intrinsic Amplitude: 1.75 mV
Lead Channel Sensing Intrinsic Amplitude: 4.375 mV
Lead Channel Sensing Intrinsic Amplitude: 5.125 mV
Lead Channel Setting Pacing Amplitude: 1.5 V
Lead Channel Setting Pacing Amplitude: 2 V
Lead Channel Setting Pacing Pulse Width: 0.4 ms
Lead Channel Setting Sensing Sensitivity: 0.3 mV

## 2020-08-12 NOTE — Progress Notes (Signed)
Remote ICD transmission.   

## 2020-08-16 ENCOUNTER — Encounter: Payer: Self-pay | Admitting: Obstetrics & Gynecology

## 2020-08-16 ENCOUNTER — Other Ambulatory Visit: Payer: Self-pay

## 2020-08-16 ENCOUNTER — Ambulatory Visit: Payer: Medicare HMO | Admitting: Obstetrics & Gynecology

## 2020-08-16 VITALS — BP 140/80 | Ht 66.0 in | Wt 207.0 lb

## 2020-08-16 DIAGNOSIS — Z90710 Acquired absence of both cervix and uterus: Secondary | ICD-10-CM | POA: Diagnosis not present

## 2020-08-16 DIAGNOSIS — Z933 Colostomy status: Secondary | ICD-10-CM | POA: Diagnosis not present

## 2020-08-16 DIAGNOSIS — L988 Other specified disorders of the skin and subcutaneous tissue: Secondary | ICD-10-CM

## 2020-08-16 NOTE — Progress Notes (Signed)
    [name] 1933-11-27 [id number]        86 y.o.  G3P3L3  RP: Brown discharge at the perineum x 2 months  HPI: S/P Total Hysterectomy.  Has a Colostomy with closure of the anus with Dr Dalbert Batman.  No abdominopelvic pain.  Urine normal.  No fever.   OB History  Gravida Para Term Preterm AB Living  3 3       3   SAB TAB Ectopic Multiple Live Births               # Outcome Date GA Lbr Len/2nd Weight Sex Delivery Anes PTL Lv  3 Para           2 Para           1 Para             Past medical history,surgical history, problem list, medications, allergies, family history and social history were all reviewed and documented in the EPIC chart.   Directed ROS with pertinent positives and negatives documented in the history of present illness/assessment and plan.  Exam:  Vitals:   08/16/20 1210  BP: 140/80  Weight: 207 lb (93.9 kg)  Height: 5\' 6"  (1.676 m)   General appearance:  Normal  Abdomen: Colostomy  Gynecologic exam:  Vulva:  Postmenopausal atrophy.  Brown material c/w fecal matter from a small opening just posterior to the vagina.  Anus closed surgically.  Speculum:  Normal vagina, no lesion.  White secretions.  No bleeding.  Nothing brown.     Assessment/Plan:  86 y.o. G3P3   1. 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  Colostomy with closed anus.  Refer back to Dr Dalbert Batman.  2. Colostomy in place [facility])  3. S/P total hysterectomy  Princess Bruins MD, 12:50 PM 08/16/2020

## 2020-08-17 ENCOUNTER — Telehealth: Payer: Self-pay | Admitting: *Deleted

## 2020-08-17 DIAGNOSIS — Z933 Colostomy status: Secondary | ICD-10-CM

## 2020-08-17 DIAGNOSIS — L988 Other specified disorders of the skin and subcutaneous tissue: Secondary | ICD-10-CM

## 2020-08-17 NOTE — Telephone Encounter (Signed)
Referral placed in [facility] at [facility] surgery, they will call to schedule.

## 2020-08-17 NOTE — Telephone Encounter (Signed)
-----   Message from Princess Bruins, MD sent at 08/16/2020 12:46 PM EST ----- Regarding: Refer back to Dr Maudie Flakes d/c x 2 months.  Patient has a Colostomy bag and anus closed by Dr Dalbert Batman.  Probable small fistula posterior to vagina with small amount of fecal material coming out.  Vagina normal s/p Total Hysterectomy.  Please help patient schedule a visit with Dr Dalbert Batman to evaluate.

## 2020-08-18 NOTE — Telephone Encounter (Signed)
Dr.Lavoie Lilia Pro called from [facility] surgery office to say Dr.Ingram is retired. And the providers at [facility] surgery will want a CT of abdomen/ pelvis before scheduling patient for an appointment. Please advise if CT should have contrast.

## 2020-08-19 NOTE — Telephone Encounter (Signed)
Left message for patient to call.

## 2020-08-19 NOTE — Telephone Encounter (Signed)
Yes, CT with contrast.

## 2020-08-19 NOTE — Telephone Encounter (Signed)
I also relayed the below to [place] so oral contrast can be picked for patient.

## 2020-08-19 NOTE — Telephone Encounter (Signed)
Patient scheduled on 09/08/20 @ 1:00pm at [place] 4 hours prior to imaging, okay to drink fluids. Will need to pickup contrast 2 days prior to exam at [address] All informed relayed to [facility] who will transport patient to her appointment per patient request.

## 2020-08-21 ENCOUNTER — Encounter: Payer: Self-pay | Admitting: Obstetrics & Gynecology

## 2020-09-07 ENCOUNTER — Telehealth: Payer: Self-pay

## 2020-09-07 NOTE — Telephone Encounter (Signed)
I received a message that rep was unable to approve the case at her level.  She asked me to call back with how we would like to proceed. The fax I received recommended Dr. Marguerita Merles withdraw the request.  I called and was tranferred to the voice mail of Deanne Coffer, LPN and left a message with tracking# [id number] and requested to know what our next step is toward getting patient approved for CT scan.

## 2020-09-08 ENCOUNTER — Other Ambulatory Visit: Payer: Medicare HMO

## 2020-09-08 NOTE — Telephone Encounter (Signed)
I faxed form back that ins co sent me and I checked "Escalate to clinical review, please provide physician cell phone number of direct contact number."  I put the office phone number on it and asked them to provide me a phone number for Dr. Marguerita Merles to call as she is seeing patients all day and needs to call when she can find time. Faxed it back on 09/07/20 at 3:03pm.

## 2020-09-09 NOTE — Telephone Encounter (Signed)
I received a fax with "[employer] Confirmation Number for Exam Scheduling" valid 12/1-12/31/21. I called [facility] and spoke with Teara and informed her.  She asked I fax it to her and she will have appt desk get patient rescheduled for CT abd/pelvis. I faxed it.

## 2020-09-13 NOTE — Telephone Encounter (Signed)
Patient CT scan has been rescheduled to 09/30/20 @ 10:00am at [place] [facility] due to insurance. She will need drink the contrast at 8:20am and 9:20am which [name] (her transportation aid) is aware of. I asked to speak with the nurse so I can relay this information to her as well. However at this facility they have med techs and she was on the floor with patients and not able to come to the phone . [name] gave me the name of [name] whom is the resident leader. I relayed all this informed to her as well and was informed that [name] will be moving to another facility because her current facility is doing [name], [name] was not aware of what facility [name] will be moving to, she asked me to fax this information to her to her fax at [phone] to her attention. And she told me she would pass this along to the "new" facility once [name] is moved so they can take her to this appointment.

## 2020-09-21 ENCOUNTER — Encounter: Payer: Self-pay | Admitting: Anesthesiology

## 2020-09-30 ENCOUNTER — Ambulatory Visit
Admission: RE | Admit: 2020-09-30 | Discharge: 2020-09-30 | Disposition: A | Discharge status: Home / Self Care | Payer: Medicare HMO | Source: Ambulatory Visit | Attending: Obstetrics & Gynecology | Admitting: Obstetrics & Gynecology

## 2020-09-30 DIAGNOSIS — L988 Other specified disorders of the skin and subcutaneous tissue: Secondary | ICD-10-CM

## 2020-09-30 DIAGNOSIS — Z933 Colostomy status: Secondary | ICD-10-CM

## 2020-09-30 MED ORDER — IOPAMIDOL (ISOVUE-300) INJECTION 61%
80.0000 mL | Freq: Once | INTRAVENOUS | Status: AC | PRN
  Administered 2020-09-30: 80 mL via INTRAVENOUS

## 2020-10-05 ENCOUNTER — Telehealth: Payer: Self-pay | Admitting: *Deleted

## 2020-10-05 NOTE — Telephone Encounter (Signed)
Patient informed report the brown discharge is better, patient reports very light now. She declined to have a visit with general surgery at this time.

## 2020-10-05 NOTE — Telephone Encounter (Signed)
Well, if patient is bothered by the brown discharge from the vagina, I would recommend to see the general surgeon because a small fistula can be missed by the CT scan.  But at 86 yo, if not bothered by it, they will probably not intervene, so she could decide to observe for now and just go later if the symptoms interfere significantly with her daily activities.

## 2020-10-05 NOTE — Telephone Encounter (Signed)
-----   Message from Genia Del, MD sent at 10/04/2020  5:33 PM EST ----- CT Cecum in close proximity to the vaginal cuff, but no fistula identified.  No pelvic mass or free fluid.

## 2020-10-05 NOTE — Telephone Encounter (Signed)
Dr.Lavoie CT scan was ordered before referral to [facility] surgery. I assume no referral is needed based on results below.  Per your previous staff message "Manson Passey d/c x 2 months.  Patient has a Colostomy bag and anus closed by Dr Derrell Lolling.  Probable small fistula posterior to vagina with small amount of fecal material coming out.  Vagina normal s/p Total Hysterectomy."

## 2020-10-13 ENCOUNTER — Encounter: Payer: Self-pay | Admitting: Surgery

## 2020-11-08 ENCOUNTER — Encounter: Payer: Self-pay | Admitting: Surgery

## 2020-12-20 ENCOUNTER — Ambulatory Visit (INDEPENDENT_AMBULATORY_CARE_PROVIDER_SITE_OTHER): Payer: Medicare HMO

## 2020-12-20 DIAGNOSIS — I428 Other cardiomyopathies: Secondary | ICD-10-CM | POA: Diagnosis not present

## 2020-12-23 LAB — CUP PACEART REMOTE DEVICE CHECK
Battery Remaining Longevity: 9 mo
Battery Voltage: 2.84 V
Brady Statistic AP VP Percent: 0.02 %
Brady Statistic AP VS Percent: 0 %
Brady Statistic AS VP Percent: 99.79 %
Brady Statistic AS VS Percent: 0.18 %
Brady Statistic RA Percent Paced: 0.02 %
Brady Statistic RV Percent Paced: 99.82 %
Date Time Interrogation Session: 20220315132742
HighPow Impedance: 51 Ohm
Implantable Lead Implant Date: 20131216
Implantable Lead Implant Date: 20131216
Implantable Lead Location: 753859
Implantable Lead Location: 753860
Implantable Lead Model: 5076
Implantable Pulse Generator Implant Date: 20131216
Lead Channel Impedance Value: 304 Ohm
Lead Channel Impedance Value: 342 Ohm
Lead Channel Impedance Value: 380 Ohm
Lead Channel Pacing Threshold Amplitude: 0.625 V
Lead Channel Pacing Threshold Amplitude: 0.75 V
Lead Channel Pacing Threshold Pulse Width: 0.4 ms
Lead Channel Pacing Threshold Pulse Width: 0.4 ms
Lead Channel Sensing Intrinsic Amplitude: 1.375 mV
Lead Channel Sensing Intrinsic Amplitude: 1.375 mV
Lead Channel Sensing Intrinsic Amplitude: 4.375 mV
Lead Channel Sensing Intrinsic Amplitude: 5.125 mV
Lead Channel Setting Pacing Amplitude: 1.5 V
Lead Channel Setting Pacing Amplitude: 2 V
Lead Channel Setting Pacing Pulse Width: 0.4 ms
Lead Channel Setting Sensing Sensitivity: 0.3 mV

## 2020-12-29 NOTE — Progress Notes (Signed)
Remote ICD transmission.   

## 2021-02-01 ENCOUNTER — Telehealth: Payer: Self-pay | Admitting: Internal Medicine

## 2021-02-01 NOTE — Telephone Encounter (Signed)
  1. Has your device fired? No   2. Is you device beeping? No   3. Are you experiencing draining or swelling at device site? No   4. Are you calling to see if we received your device transmission? No   5. Have you passed out? No   Wanting to know if patient can send in transmission reading in time on scheduled date or if it automatically sends in.     Please route to [place]

## 2021-02-01 NOTE — Telephone Encounter (Signed)
Spoke to patients Caregiver at [facility]). Advised patients remote monitor is automatic and should be placed within 4-6 feet of patient. Assure nothing is between patient and monitor. Advised to call the device clinic with further questions or concerns. Thanked me for call.

## 2021-02-14 ENCOUNTER — Encounter: Payer: Medicare HMO | Admitting: Internal Medicine

## 2021-02-21 ENCOUNTER — Encounter: Payer: Medicare HMO | Admitting: Internal Medicine

## 2021-02-21 ENCOUNTER — Telehealth: Payer: Self-pay

## 2021-02-21 DIAGNOSIS — I10 Essential (primary) hypertension: Secondary | ICD-10-CM

## 2021-02-21 DIAGNOSIS — I5042 Chronic combined systolic (congestive) and diastolic (congestive) heart failure: Secondary | ICD-10-CM

## 2021-02-21 DIAGNOSIS — I442 Atrioventricular block, complete: Secondary | ICD-10-CM

## 2021-02-21 NOTE — Telephone Encounter (Signed)
Spoke with Magda Paganini at facility regarding appt on 02/21/21. Magda Paganini stated she would like to reschedule Ms Noy appt.

## 2021-02-27 ENCOUNTER — Encounter (HOSPITAL_COMMUNITY): Payer: Self-pay

## 2021-02-27 ENCOUNTER — Emergency Department (HOSPITAL_COMMUNITY): Payer: Medicare HMO

## 2021-02-27 ENCOUNTER — Emergency Department (HOSPITAL_COMMUNITY)
Admission: EM | Admit: 2021-02-27 | Discharge: 2021-02-27 | Disposition: A | Discharge status: Home / Self Care | Payer: Medicare HMO | Attending: Emergency Medicine | Admitting: Emergency Medicine

## 2021-02-27 ENCOUNTER — Other Ambulatory Visit: Payer: Self-pay

## 2021-02-27 DIAGNOSIS — E1122 Type 2 diabetes mellitus with diabetic chronic kidney disease: Secondary | ICD-10-CM | POA: Insufficient documentation

## 2021-02-27 DIAGNOSIS — Z85038 Personal history of other malignant neoplasm of large intestine: Secondary | ICD-10-CM | POA: Diagnosis not present

## 2021-02-27 DIAGNOSIS — Z7982 Long term (current) use of aspirin: Secondary | ICD-10-CM | POA: Diagnosis not present

## 2021-02-27 DIAGNOSIS — Z950 Presence of cardiac pacemaker: Secondary | ICD-10-CM | POA: Diagnosis not present

## 2021-02-27 DIAGNOSIS — E039 Hypothyroidism, unspecified: Secondary | ICD-10-CM | POA: Diagnosis not present

## 2021-02-27 DIAGNOSIS — I5042 Chronic combined systolic (congestive) and diastolic (congestive) heart failure: Secondary | ICD-10-CM | POA: Insufficient documentation

## 2021-02-27 DIAGNOSIS — Z79899 Other long term (current) drug therapy: Secondary | ICD-10-CM | POA: Insufficient documentation

## 2021-02-27 DIAGNOSIS — J45909 Unspecified asthma, uncomplicated: Secondary | ICD-10-CM | POA: Diagnosis not present

## 2021-02-27 DIAGNOSIS — I130 Hypertensive heart and chronic kidney disease with heart failure and stage 1 through stage 4 chronic kidney disease, or unspecified chronic kidney disease: Secondary | ICD-10-CM | POA: Insufficient documentation

## 2021-02-27 DIAGNOSIS — W01198A Fall on same level from slipping, tripping and stumbling with subsequent striking against other object, initial encounter: Secondary | ICD-10-CM | POA: Diagnosis not present

## 2021-02-27 DIAGNOSIS — N183 Chronic kidney disease, stage 3 unspecified: Secondary | ICD-10-CM | POA: Diagnosis not present

## 2021-02-27 DIAGNOSIS — F0390 Unspecified dementia without behavioral disturbance: Secondary | ICD-10-CM | POA: Insufficient documentation

## 2021-02-27 DIAGNOSIS — S0990XA Unspecified injury of head, initial encounter: Secondary | ICD-10-CM | POA: Diagnosis not present

## 2021-02-27 DIAGNOSIS — S8001XA Contusion of right knee, initial encounter: Secondary | ICD-10-CM

## 2021-02-27 DIAGNOSIS — W19XXXA Unspecified fall, initial encounter: Secondary | ICD-10-CM

## 2021-02-27 NOTE — Discharge Instructions (Addendum)
Return to the emergency department for severe headache, difficulty waking, convulsions, vomiting, or other new and concerning symptoms.

## 2021-02-27 NOTE — ED Provider Notes (Signed)
[facility] Provider Note   CSN: [id number] Arrival date & time: 02/27/21  0430     History Chief Complaint  Patient presents with  . Fall    [name] is a 87 y.o. female.  Patient is an 87 year old female with past medical history of COPD, CHF, diabetes, hypertension, prior PE.  Patient presenting by EMS from her assisted living facility for evaluation of fall.  Patient was attempting to get out of bed this morning when she lost her balance and fell to the floor.  She reports hitting the left top of her head on the floor, but denies any loss of consciousness.  She was unable to get up, then had to crawl to her door and call for help.  She denies to me she experienced any loss of consciousness.  She denies any neck pain.  She denies any numbness, weakness, or tingling.  The history is provided by the patient.  Fall This is a new problem. The current episode started less than 1 hour ago. The problem occurs constantly. The problem has not changed since onset.Nothing aggravates the symptoms. Nothing relieves the symptoms. She has tried nothing for the symptoms.       Past Medical History:  Diagnosis Date  . Arthritis    "comes and goes" (03/01/2015)  . Asthma   . CHF (congestive heart failure) ([place])   . Colon cancer ([place])   . Colostomy care ([place])   . Complete heart block ([place])   . COPD (chronic obstructive pulmonary disease) ([place])   . Diabetes mellitus without complication ([place])    pt denies this hx on 03/01/2015  . HCAP (healthcare-associated pneumonia) 04/22/2017  . History of blood transfusion 03/01/2015; 04/23/2017   "related to anemia,"  . Hyperlipidemia   . Hypertension   . Hypothyroidism   . Pulmonary embolism ([place])   . Rectal cancer ypTypN0 (0/13) s/p chemoXRT/ APR/colostomy 2006 07/27/2015   [facility] [place], [place] [address] [phone]  REPORT OF SURGICAL PATHOLOGY  Case #: [id number] Patient  Name: BRUNETTE, LAVALLE PID: [id number] Pathologist: Janae Bridgeman L. Golden Circle, MD DOB/Age [birth year] (Age: 79) Gender: F Date Taken: 11/03/2004 Date Received: 11/03/2004  FINAL DIAGNOSIS  MICROSCOPIC EXAMINATION AND DIAGNOSIS  1. EXCISION, PERIRECTAL TISS    Patient Active Problem List   Diagnosis Date Noted  . Chronic combined systolic and diastolic CHF (congestive heart failure) ([place]) 04/18/2019  . Personal history of gout 08/28/2018  . Recurrent parastomal hernia at LLQ colostomy 08/21/2018  . SBO (small bowel obstruction) ([place]) 08/21/2018  . Chest pain 10/28/2017  . Aspiration of liquid 10/28/2017  . Bilateral lower extremity edema   . Pneumonia 04/22/2017  . Altered mental status   . Hypotension 02/16/2017  . Syncope 02/02/2016  . Faintness   . Hypothyroidism 11/16/2015  . Acute on chronic combined systolic and diastolic CHF  25/85/2778  . Hypertensive heart disease 10/04/2015  . Elevated troponin 10/04/2015  . Acute respiratory failure ([place])   . NICM (nonischemic cardiomyopathy) ([place]) 08/11/2015  . Normal coronary arteries 08/11/2015  . Dyslipidemia 07/27/2015  . CKD (chronic kidney disease) stage 3, GFR 30-59 ml/min 07/27/2015  . Anemia 07/27/2015  . FUO (fever of unknown origin) 07/27/2015  . IDA (iron deficiency anemia) 07/27/2015  . Rectal cancer ypTypN0 (0/13) s/p chemoXRT/ APR/colostomy 2006 07/27/2015  . Sepsis ([place]) 07/24/2015  . Acute encephalopathy 07/24/2015  . Hypokalemia 07/24/2015  . Dementia ([place]) 07/23/2015  . COPD (chronic obstructive pulmonary  disease) ([place]) 04/25/2015  . ICD in place 04/14/2015  . Colostomy in place [facility]) 03/01/2015  . Pacemaker 11/07/2012  . Anxiety 10/04/2011    Past Surgical History:  Procedure Laterality Date  . ABDOMINAL HYSTERECTOMY    . ABDOMINOPERINEAL PROCTOCOLECTOMY  2006   Neoadj chemoXRT & open APR Dr Dalbert Batman for low rectal cancer  . ANKLE FRACTURE SURGERY Left   . CARDIAC CATHETERIZATION N/A 03/22/2015   Procedure:  Right/Left Heart Cath and Coronary Angiography;  Surgeon: Leonie Man, MD;  Location: [place] [facility];  Service: Cardiovascular;  Laterality: N/A;  . CATARACT EXTRACTION W/ INTRAOCULAR LENS  IMPLANT, BILATERAL Bilateral   . CHOLECYSTECTOMY    . COLOSTOMY  2006   permananet end colostomy after APR resection of low rectal cancer.  Dr Dalbert Batman  . DILATION AND CURETTAGE OF UTERUS    . FRACTURE SURGERY    . ICD IMPLANT  09/23/2012   MDT, implanted for primary prevention by Dr Deno Etienne in [facility]  . LYSIS OF ADHESION  2007   SBO.  Dr Dalbert Batman  . PARASTOMAL HERNIA REPAIR  2007   lysis of adhesions  . TUBAL LIGATION       OB History    Gravida  3   Para  3   Term      Preterm      AB      Living  3     SAB      IAB      Ectopic      Multiple      Live Births              Family History  Problem Relation Age of Onset  . CAD Other   . Hypertension Other   . Stroke Other   . Heart disease Mother        No details  . Colon cancer Neg Hx   . GI Bleed Neg Hx     Social History   Tobacco Use  . Smoking status: Never Smoker  . Smokeless tobacco: Never Used  Vaping Use  . Vaping Use: Never used  Substance Use Topics  . Alcohol use: No  . Drug use: No    Home Medications Prior to Admission medications   Medication Sig Start Date End Date Taking? Authorizing Provider  acetaminophen (TYLENOL) 500 MG tablet Take 1,000 mg by mouth in the morning and at bedtime.     [provider]  alum & mag hydroxide-simeth (MAALOX/MYLANTA) 200-200-20 MG/5ML suspension Take 30 mLs by mouth as needed for indigestion or heartburn (do not exceed 4 doses in 24hrs).    [provider]  aspirin EC 81 MG tablet Take 1 tablet (81 mg total) by mouth every morning. 03/03/15   Rai, Ripudeep K, MD  atorvastatin (LIPITOR) 20 MG tablet Take 20 mg by mouth at bedtime.     [provider]  Calcium Carbonate-Vitamin D 600-400 MG-UNIT tablet Take 1 tablet by mouth  2 (two) times a day.    [provider]  carboxymethylcellulose (REFRESH) 1 % ophthalmic solution Place 1 drop into both eyes 3 (three) times daily.     [provider]  carvedilol (COREG) 6.25 MG tablet Take 6.25 mg by mouth 2 (two) times daily with a meal.     [provider]  cyclobenzaprine (FLEXERIL) 5 MG tablet Take 5 mg by mouth at bedtime.    [provider]  famotidine (PEPCID) 20 MG tablet Take 1  tablet (20 mg total) by mouth daily. 02/08/20   Annita Brod, MD  Fluticasone-Salmeterol (ADVAIR) 500-50 MCG/DOSE AEPB Inhale 1 puff into the lungs 2 (two) times daily. Rinse mouth after use    [provider]  furosemide (LASIX) 20 MG tablet Take 40 mg (2 tab) by mouth every Monday, Wednesday, and Friday. Take 20 mg (1 tab) all other days. 03/03/20   Erlene Quan, PA-C  guaifenesin (ROBITUSSIN) 100 MG/5ML syrup Take 200 mg by mouth every 6 (six) hours as needed for cough.    [provider]  hydrALAZINE (APRESOLINE) 25 MG tablet Take 1 tablet (25 mg total) by mouth every 8 (eight) hours. 02/07/20   Annita Brod, MD  iron polysaccharides (NIFEREX) 150 MG capsule Take 150 mg by mouth 2 (two) times daily.    [provider]  isosorbide mononitrate (IMDUR) 60 MG 24 hr tablet Take 1 tablet (60 mg total) by mouth daily. 02/08/20   Annita Brod, MD  levothyroxine (SYNTHROID, LEVOTHROID) 50 MCG tablet Take 50 mcg by mouth daily.  01/16/15   [provider]  loperamide (IMODIUM A-D) 2 MG tablet Take 2 mg by mouth as needed for diarrhea or loose stools (do not exceed 8 doses in 24hrs).    [provider]  loratadine (CLARITIN) 10 MG tablet Take 10 mg by mouth daily.    [provider]  magnesium hydroxide (MILK OF MAGNESIA) 400 MG/5ML suspension Take 30 mLs by mouth at bedtime as needed for mild constipation.    [provider]  neomycin-bacitracin-polymyxin (NEOSPORIN) [phone] ointment Apply 1  application topically as needed (skin cuts).    [provider]  ondansetron (ZOFRAN) 4 MG tablet Take 1 tablet (4 mg total) by mouth every 6 (six) hours as needed for nausea or vomiting. 11/07/84   Delora Fuel, MD  polyethylene glycol [facility] / Floria Raveling) packet Take 17 g by mouth daily. Mix with 8 ounces of fluid and drink     [provider]  sodium chloride (OCEAN) 0.65 % SOLN nasal spray Place 1 spray into both nostrils as needed for congestion.    [provider]    Allergies    Patient has no known allergies.  Review of Systems   Review of Systems  All other systems reviewed and are negative.   Physical Exam Updated Vital Signs BP (!) 177/82   Pulse 100   Temp 98.1 F (36.7 C) (Oral)   Resp 17   Ht 5\' 6"  (1.676 m)   Wt 93 kg   SpO2 97%   BMI 33.09 kg/m   Physical Exam Vitals and nursing note reviewed.  Constitutional:      General: She is not in acute distress.    Appearance: She is well-developed. She is not diaphoretic.  HENT:     Head: Normocephalic and atraumatic.  Eyes:     Extraocular Movements: Extraocular movements intact.     Pupils: Pupils are equal, round, and reactive to light.  Cardiovascular:     Rate and Rhythm: Normal rate and regular rhythm.     Heart sounds: No murmur heard. No friction rub. No gallop.   Pulmonary:     Effort: Pulmonary effort is normal. No respiratory distress.     Breath sounds: Normal breath sounds. No wheezing.  Abdominal:     General: Bowel sounds are normal. There is no distension.     Palpations: Abdomen is soft.     Tenderness: There is no abdominal  tenderness.  Musculoskeletal:        General: Normal range of motion.     Cervical back: Normal range of motion and neck supple.  Skin:    General: Skin is warm and dry.  Neurological:     General: No focal deficit present.     Mental Status: She is alert and oriented to person, place, and time.     Cranial Nerves: No cranial nerve deficit.      Sensory: No sensory deficit.     Motor: No weakness.     Coordination: Coordination normal.     ED Results / Procedures / Treatments   Labs (all labs ordered are listed, but only abnormal results are displayed) Labs Reviewed - No data to display  EKG None  Radiology No results found.  Procedures Procedures   Medications Ordered in ED Medications - No data to display  ED Course  I have reviewed the triage vital signs and the nursing notes.  Pertinent labs & imaging results that were available during my care of the patient were reviewed by me and considered in my medical decision making (see chart for details).    MDM Rules/Calculators/A&P  CT scan of head is negative and x-ray of the knee is negative.  Patient appears clinically well and I feel as though discharge is appropriate.  She is to return as needed for any problems.  She otherwise appears uninjured.  She has good range of motion of her hips and no neck pain or tenderness.  Final Clinical Impression(s) / ED Diagnoses Final diagnoses:  None    Rx / DC Orders ED Discharge Orders    None       Veryl Speak, MD 02/27/21 [phone]

## 2021-02-27 NOTE — ED Triage Notes (Signed)
Pt was getting out of bed and states she fell and hit her head, no LOC. States she feels dizzy but reports she has vertigo.

## 2021-03-02 ENCOUNTER — Encounter: Payer: Medicare HMO | Admitting: Student

## 2021-03-13 ENCOUNTER — Other Ambulatory Visit: Payer: Self-pay

## 2021-03-13 ENCOUNTER — Emergency Department (HOSPITAL_COMMUNITY): Payer: Medicare HMO

## 2021-03-13 ENCOUNTER — Encounter (HOSPITAL_COMMUNITY): Payer: Self-pay | Admitting: Emergency Medicine

## 2021-03-13 ENCOUNTER — Observation Stay (HOSPITAL_COMMUNITY)
Admission: EM | Admit: 2021-03-13 | Discharge: 2021-03-15 | Disposition: A | Discharge status: Home / Self Care | Payer: Medicare HMO | Attending: Internal Medicine | Admitting: Internal Medicine

## 2021-03-13 DIAGNOSIS — Z950 Presence of cardiac pacemaker: Secondary | ICD-10-CM | POA: Diagnosis not present

## 2021-03-13 DIAGNOSIS — Z20822 Contact with and (suspected) exposure to covid-19: Secondary | ICD-10-CM | POA: Diagnosis not present

## 2021-03-13 DIAGNOSIS — I5042 Chronic combined systolic (congestive) and diastolic (congestive) heart failure: Secondary | ICD-10-CM | POA: Diagnosis not present

## 2021-03-13 DIAGNOSIS — F0390 Unspecified dementia without behavioral disturbance: Secondary | ICD-10-CM | POA: Diagnosis not present

## 2021-03-13 DIAGNOSIS — E1122 Type 2 diabetes mellitus with diabetic chronic kidney disease: Secondary | ICD-10-CM | POA: Diagnosis not present

## 2021-03-13 DIAGNOSIS — N1831 Chronic kidney disease, stage 3a: Secondary | ICD-10-CM

## 2021-03-13 DIAGNOSIS — D649 Anemia, unspecified: Secondary | ICD-10-CM | POA: Diagnosis present

## 2021-03-13 DIAGNOSIS — Z85038 Personal history of other malignant neoplasm of large intestine: Secondary | ICD-10-CM | POA: Insufficient documentation

## 2021-03-13 DIAGNOSIS — J449 Chronic obstructive pulmonary disease, unspecified: Secondary | ICD-10-CM | POA: Diagnosis not present

## 2021-03-13 DIAGNOSIS — E860 Dehydration: Secondary | ICD-10-CM

## 2021-03-13 DIAGNOSIS — Z7982 Long term (current) use of aspirin: Secondary | ICD-10-CM | POA: Insufficient documentation

## 2021-03-13 DIAGNOSIS — I130 Hypertensive heart and chronic kidney disease with heart failure and stage 1 through stage 4 chronic kidney disease, or unspecified chronic kidney disease: Secondary | ICD-10-CM | POA: Diagnosis not present

## 2021-03-13 DIAGNOSIS — E039 Hypothyroidism, unspecified: Secondary | ICD-10-CM | POA: Insufficient documentation

## 2021-03-13 DIAGNOSIS — I428 Other cardiomyopathies: Secondary | ICD-10-CM

## 2021-03-13 DIAGNOSIS — G309 Alzheimer's disease, unspecified: Secondary | ICD-10-CM

## 2021-03-13 DIAGNOSIS — N179 Acute kidney failure, unspecified: Principal | ICD-10-CM | POA: Insufficient documentation

## 2021-03-13 DIAGNOSIS — J45909 Unspecified asthma, uncomplicated: Secondary | ICD-10-CM | POA: Diagnosis not present

## 2021-03-13 DIAGNOSIS — Z79899 Other long term (current) drug therapy: Secondary | ICD-10-CM | POA: Insufficient documentation

## 2021-03-13 DIAGNOSIS — J439 Emphysema, unspecified: Secondary | ICD-10-CM | POA: Diagnosis not present

## 2021-03-13 DIAGNOSIS — R42 Dizziness and giddiness: Secondary | ICD-10-CM | POA: Diagnosis present

## 2021-03-13 DIAGNOSIS — Z85048 Personal history of other malignant neoplasm of rectum, rectosigmoid junction, and anus: Secondary | ICD-10-CM | POA: Diagnosis not present

## 2021-03-13 DIAGNOSIS — F0280 Dementia in other diseases classified elsewhere without behavioral disturbance: Secondary | ICD-10-CM

## 2021-03-13 LAB — URINALYSIS, COMPLETE (UACMP) WITH MICROSCOPIC
Bilirubin Urine: NEGATIVE
Glucose, UA: NEGATIVE mg/dL
Hgb urine dipstick: NEGATIVE
Ketones, ur: NEGATIVE mg/dL
Leukocytes,Ua: NEGATIVE
Nitrite: NEGATIVE
Protein, ur: 100 mg/dL — AB
Specific Gravity, Urine: 1.015 (ref 1.005–1.030)
pH: 5 (ref 5.0–8.0)

## 2021-03-13 LAB — CBC WITH DIFFERENTIAL/PLATELET
Abs Immature Granulocytes: 0.02 10*3/uL (ref 0.00–0.07)
Basophils Absolute: 0 10*3/uL (ref 0.0–0.1)
Basophils Relative: 1 %
Eosinophils Absolute: 0 10*3/uL (ref 0.0–0.5)
Eosinophils Relative: 1 %
HCT: 31 % — ABNORMAL LOW (ref 36.0–46.0)
Hemoglobin: 9.3 g/dL — ABNORMAL LOW (ref 12.0–15.0)
Immature Granulocytes: 0 %
Lymphocytes Relative: 30 %
Lymphs Abs: 1.9 10*3/uL (ref 0.7–4.0)
MCH: 28.6 pg (ref 26.0–34.0)
MCHC: 30 g/dL (ref 30.0–36.0)
MCV: 95.4 fL (ref 80.0–100.0)
Monocytes Absolute: 0.7 10*3/uL (ref 0.1–1.0)
Monocytes Relative: 11 %
Neutro Abs: 3.6 10*3/uL (ref 1.7–7.7)
Neutrophils Relative %: 57 %
Platelets: 228 10*3/uL (ref 150–400)
RBC: 3.25 MIL/uL — ABNORMAL LOW (ref 3.87–5.11)
RDW: 15 % (ref 11.5–15.5)
WBC: 6.2 10*3/uL (ref 4.0–10.5)
nRBC: 0 % (ref 0.0–0.2)

## 2021-03-13 LAB — BASIC METABOLIC PANEL
Anion gap: 9 (ref 5–15)
BUN: 41 mg/dL — ABNORMAL HIGH (ref 8–23)
CO2: 27 mmol/L (ref 22–32)
Calcium: 9.1 mg/dL (ref 8.9–10.3)
Chloride: 100 mmol/L (ref 98–111)
Creatinine, Ser: 1.91 mg/dL — ABNORMAL HIGH (ref 0.44–1.00)
GFR, Estimated: 26 mL/min — ABNORMAL LOW (ref >60)
Glucose, Bld: 97 mg/dL (ref 70–99)
Potassium: 4.5 mmol/L (ref 3.5–5.1)
Sodium: 136 mmol/L (ref 135–145)

## 2021-03-13 LAB — RESP PANEL BY RT-PCR (FLU A&B, COVID) ARPGX2
Influenza A by PCR: NEGATIVE
Influenza B by PCR: NEGATIVE
SARS Coronavirus 2 by RT PCR: NEGATIVE

## 2021-03-13 LAB — CREATININE, URINE, RANDOM: Creatinine, Urine: 128.07 mg/dL

## 2021-03-13 LAB — SODIUM, URINE, RANDOM: Sodium, Ur: 45 mmol/L

## 2021-03-13 MED ORDER — SODIUM CHLORIDE 0.9 % IV SOLN: 250.0000 mL | INTRAVENOUS | Status: DC | PRN

## 2021-03-13 MED ORDER — HYDRALAZINE HCL 25 MG PO TABS
25.0000 mg | ORAL_TABLET | Freq: Three times a day (TID) | ORAL | Status: DC
  Administered 2021-03-14 (×2): 25 mg via ORAL
  Filled 2021-03-13 (×2): qty 1

## 2021-03-13 MED ORDER — ONDANSETRON HCL 4 MG/2ML IJ SOLN: 4.0000 mg | Freq: Four times a day (QID) | INTRAMUSCULAR | Status: DC | PRN

## 2021-03-13 MED ORDER — ACETAMINOPHEN 650 MG RE SUPP: 650.0000 mg | Freq: Four times a day (QID) | RECTAL | Status: DC | PRN

## 2021-03-13 MED ORDER — POLYVINYL ALCOHOL 1.4 % OP SOLN
1.0000 [drp] | Freq: Three times a day (TID) | OPHTHALMIC | Status: DC
  Administered 2021-03-14 – 2021-03-15 (×5): 1 [drp] via OPHTHALMIC
  Filled 2021-03-13: qty 15

## 2021-03-13 MED ORDER — ISOSORBIDE MONONITRATE ER 60 MG PO TB24
60.0000 mg | ORAL_TABLET | Freq: Every day | ORAL | Status: DC
  Administered 2021-03-14 – 2021-03-15 (×2): 60 mg via ORAL
  Filled 2021-03-13 (×2): qty 1

## 2021-03-13 MED ORDER — SENNOSIDES-DOCUSATE SODIUM 8.6-50 MG PO TABS: 1.0000 | ORAL_TABLET | Freq: Every evening | ORAL | Status: DC | PRN

## 2021-03-13 MED ORDER — ASPIRIN EC 81 MG PO TBEC
81.0000 mg | DELAYED_RELEASE_TABLET | Freq: Every day | ORAL | Status: DC
  Administered 2021-03-14 – 2021-03-15 (×2): 81 mg via ORAL
  Filled 2021-03-13 (×2): qty 1

## 2021-03-13 MED ORDER — MOMETASONE FURO-FORMOTEROL FUM 200-5 MCG/ACT IN AERO
2.0000 | INHALATION_SPRAY | Freq: Two times a day (BID) | RESPIRATORY_TRACT | Status: DC
  Administered 2021-03-14 – 2021-03-15 (×4): 2 via RESPIRATORY_TRACT
  Filled 2021-03-13 (×2): qty 8.8

## 2021-03-13 MED ORDER — LEVOTHYROXINE SODIUM 50 MCG PO TABS
50.0000 ug | ORAL_TABLET | Freq: Every day | ORAL | Status: DC
  Administered 2021-03-14 – 2021-03-15 (×2): 50 ug via ORAL
  Filled 2021-03-13 (×2): qty 1

## 2021-03-13 MED ORDER — ACETAMINOPHEN 325 MG PO TABS
650.0000 mg | ORAL_TABLET | Freq: Four times a day (QID) | ORAL | Status: DC | PRN
  Administered 2021-03-14 – 2021-03-15 (×2): 650 mg via ORAL
  Filled 2021-03-13 (×2): qty 2

## 2021-03-13 MED ORDER — GUAIFENESIN 100 MG/5ML PO SOLN
200.0000 mg | Freq: Four times a day (QID) | ORAL | Status: DC | PRN
  Administered 2021-03-14 – 2021-03-15 (×2): 200 mg via ORAL
  Filled 2021-03-13 (×2): qty 10

## 2021-03-13 MED ORDER — LACTATED RINGERS IV BOLUS
1000.0000 mL | Freq: Once | INTRAVENOUS | Status: AC
  Administered 2021-03-13: 1000 mL via INTRAVENOUS

## 2021-03-13 MED ORDER — MECLIZINE HCL 25 MG PO TABS: 25.0000 mg | ORAL_TABLET | Freq: Three times a day (TID) | ORAL | Status: DC | PRN

## 2021-03-13 MED ORDER — ONDANSETRON HCL 4 MG PO TABS
4.0000 mg | ORAL_TABLET | Freq: Four times a day (QID) | ORAL | Status: DC | PRN
Start: 2021-03-13 — End: 2021-03-15

## 2021-03-13 MED ORDER — SALINE SPRAY 0.65 % NA SOLN: 1.0000 | Freq: Two times a day (BID) | NASAL | Status: DC | PRN

## 2021-03-13 MED ORDER — FAMOTIDINE 20 MG PO TABS
10.0000 mg | ORAL_TABLET | Freq: Every day | ORAL | Status: DC
  Administered 2021-03-14 – 2021-03-15 (×2): 10 mg via ORAL
  Filled 2021-03-13 (×2): qty 1

## 2021-03-13 MED ORDER — CARBOXYMETHYLCELLULOSE SODIUM 1 % OP SOLN
1.0000 [drp] | Freq: Three times a day (TID) | OPHTHALMIC | Status: DC
Start: 2021-03-13 — End: 2021-03-13

## 2021-03-13 MED ORDER — CARVEDILOL 6.25 MG PO TABS
6.2500 mg | ORAL_TABLET | Freq: Two times a day (BID) | ORAL | Status: DC
Start: 2021-03-14 — End: 2021-03-15
  Administered 2021-03-14 – 2021-03-15 (×3): 6.25 mg via ORAL
  Filled 2021-03-13: qty 2
  Filled 2021-03-13: qty 1
  Filled 2021-03-13: qty 2
  Filled 2021-03-13: qty 1

## 2021-03-13 MED ORDER — LACTATED RINGERS IV SOLN: INTRAVENOUS | Status: DC

## 2021-03-13 MED ORDER — ATORVASTATIN CALCIUM 10 MG PO TABS
20.0000 mg | ORAL_TABLET | Freq: Every day | ORAL | Status: DC
  Administered 2021-03-14 (×2): 20 mg via ORAL
  Filled 2021-03-13 (×2): qty 2

## 2021-03-13 MED ORDER — HEPARIN SODIUM (PORCINE) 5000 UNIT/ML IJ SOLN
5000.0000 [IU] | Freq: Three times a day (TID) | INTRAMUSCULAR | Status: DC
  Administered 2021-03-14 – 2021-03-15 (×4): 5000 [IU] via SUBCUTANEOUS
  Filled 2021-03-13 (×4): qty 1

## 2021-03-13 NOTE — ED Provider Notes (Signed)
[name]   CSN: [id number] Arrival date & time: 03/13/21  1643     History Chief Complaint  Patient presents with  . Fall    [name] is a 87 y.o. female.  87 year old female presents with dizziness x2 days.  Patient states she feels off balance when she tries to walk.  Does normally use a walker to ambulate.  Denies any new focal weakness in arms or legs.  Has not had any headache.  No recent changes to her medications.  No vomiting.  No treatment use prior to arrival        Past Medical History:  Diagnosis Date  . Arthritis    "comes and goes" (03/01/2015)  . Asthma   . CHF (congestive heart failure) ([place])   . Colon cancer ([place])   . Colostomy care ([place])   . Complete heart block ([place])   . COPD (chronic obstructive pulmonary disease) ([place])   . Diabetes mellitus without complication ([place])    pt denies this hx on 03/01/2015  . HCAP (healthcare-associated pneumonia) 04/22/2017  . History of blood transfusion 03/01/2015; 04/23/2017   "related to anemia,"  . Hyperlipidemia   . Hypertension   . Hypothyroidism   . Pulmonary embolism ([place])   . Rectal cancer ypTypN0 (0/13) s/p chemoXRT/ APR/colostomy 2006 07/27/2015   [facility] [place], [place] [address] [phone]  REPORT OF SURGICAL PATHOLOGY  Case #: [id number] Patient Name: [name], NAME PID: [id number] Pathologist: Janae Bridgeman L. Golden Circle, MD DOB/Age 09-22-1934 (Age: 41) Gender: F Date Taken: 11/03/2004 Date Received: 11/03/2004  FINAL DIAGNOSIS  MICROSCOPIC EXAMINATION AND DIAGNOSIS  1. EXCISION, PERIRECTAL TISS    Patient Active Problem List   Diagnosis Date Noted  . Chronic combined systolic and diastolic CHF (congestive heart failure) ([place]) 04/18/2019  . Personal history of gout 08/28/2018  . Recurrent parastomal hernia at LLQ colostomy 08/21/2018  . SBO (small bowel obstruction) ([place]) 08/21/2018  . Chest pain 10/28/2017   . Aspiration of liquid 10/28/2017  . Bilateral lower extremity edema   . Pneumonia 04/22/2017  . Altered mental status   . Hypotension 02/16/2017  . Syncope 02/02/2016  . Faintness   . Hypothyroidism 11/16/2015  . Acute on chronic combined systolic and diastolic CHF  75/91/6384  . Hypertensive heart disease 10/04/2015  . Elevated troponin 10/04/2015  . Acute respiratory failure ([place])   . NICM (nonischemic cardiomyopathy) ([place]) 08/11/2015  . Normal coronary arteries 08/11/2015  . Dyslipidemia 07/27/2015  . CKD (chronic kidney disease) stage 3, GFR 30-59 ml/min 07/27/2015  . Anemia 07/27/2015  . FUO (fever of unknown origin) 07/27/2015  . IDA (iron deficiency anemia) 07/27/2015  . Rectal cancer ypTypN0 (0/13) s/p chemoXRT/ APR/colostomy 2006 07/27/2015  . Sepsis ([place]) 07/24/2015  . Acute encephalopathy 07/24/2015  . Hypokalemia 07/24/2015  . Dementia ([place]) 07/23/2015  . COPD (chronic obstructive pulmonary disease) ([place]) 04/25/2015  . ICD in place 04/14/2015  . Colostomy in place [facility]) 03/01/2015  . Pacemaker 11/07/2012  . Anxiety 10/04/2011    Past Surgical History:  Procedure Laterality Date  . ABDOMINAL HYSTERECTOMY    . ABDOMINOPERINEAL PROCTOCOLECTOMY  2006   Neoadj chemoXRT & open APR Dr Dalbert Batman for low rectal cancer  . ANKLE FRACTURE SURGERY Left   . CARDIAC CATHETERIZATION N/A 03/22/2015   Procedure: Right/Left Heart Cath and Coronary Angiography;  Surgeon: Leonie Man, MD;  Location: [place] CV [facility];  Service: Cardiovascular;  Laterality: N/A;  .  CATARACT EXTRACTION W/ INTRAOCULAR LENS  IMPLANT, BILATERAL Bilateral   . CHOLECYSTECTOMY    . COLOSTOMY  2006   permananet end colostomy after APR resection of low rectal cancer.  Dr Dalbert Batman  . DILATION AND CURETTAGE OF UTERUS    . FRACTURE SURGERY    . ICD IMPLANT  09/23/2012   MDT, implanted for primary prevention by Dr Deno Etienne in [facility]  . LYSIS OF ADHESION  2007   SBO.  Dr Dalbert Batman  . PARASTOMAL HERNIA  REPAIR  2007   lysis of adhesions  . TUBAL LIGATION       OB History    Gravida  3   Para  3   Term      Preterm      AB      Living  3     SAB      IAB      Ectopic      Multiple      Live Births              Family History  Problem Relation Age of Onset  . CAD Other   . Hypertension Other   . Stroke Other   . Heart disease Mother        No details  . Colon cancer Neg Hx   . GI Bleed Neg Hx     Social History   Tobacco Use  . Smoking status: Never Smoker  . Smokeless tobacco: Never Used  Vaping Use  . Vaping Use: Never used  Substance Use Topics  . Alcohol use: No  . Drug use: No    Home Medications Prior to Admission medications   Medication Sig Start Date End Date Taking? Authorizing Provider  acetaminophen (TYLENOL) 500 MG tablet Take 1,000 mg by mouth in the morning and at bedtime.     [provider]  alum & mag hydroxide-simeth (MAALOX/MYLANTA) 200-200-20 MG/5ML suspension Take 30 mLs by mouth as needed for indigestion or heartburn (do not exceed 4 doses in 24hrs).    [provider]  aspirin EC 81 MG tablet Take 1 tablet (81 mg total) by mouth every morning. 03/03/15   Rai, Ripudeep K, MD  atorvastatin (LIPITOR) 20 MG tablet Take 20 mg by mouth at bedtime.     [provider]  Calcium Carbonate-Vitamin D 600-400 MG-UNIT tablet Take 1 tablet by mouth 2 (two) times a day.    [provider]  carboxymethylcellulose (REFRESH) 1 % ophthalmic solution Place 1 drop into both eyes 3 (three) times daily.     [provider]  carvedilol (COREG) 6.25 MG tablet Take 6.25 mg by mouth 2 (two) times daily with a meal.     [provider]  cyclobenzaprine (FLEXERIL) 5 MG tablet Take 5 mg by mouth at bedtime.    [provider]  famotidine (PEPCID) 20 MG tablet Take 1 tablet (20 mg total) by mouth daily. 02/08/20   Annita Brod, MD  Fluticasone-Salmeterol (ADVAIR) 500-50 MCG/DOSE AEPB Inhale  1 puff into the lungs 2 (two) times daily. Rinse mouth after use    [provider]  furosemide (LASIX) 20 MG tablet Take 40 mg (2 tab) by mouth every Monday, Wednesday, and Friday. Take 20 mg (1 tab) all other days. 03/03/20   Erlene Quan, PA-C  guaifenesin (ROBITUSSIN) 100 MG/5ML syrup Take 200 mg by mouth every 6 (six) hours as needed for cough.    [provider]  hydrALAZINE (APRESOLINE)  25 MG tablet Take 1 tablet (25 mg total) by mouth every 8 (eight) hours. 02/07/20   Annita Brod, MD  iron polysaccharides (NIFEREX) 150 MG capsule Take 150 mg by mouth 2 (two) times daily.    [provider]  isosorbide mononitrate (IMDUR) 60 MG 24 hr tablet Take 1 tablet (60 mg total) by mouth daily. 02/08/20   Annita Brod, MD  levothyroxine (SYNTHROID, LEVOTHROID) 50 MCG tablet Take 50 mcg by mouth daily.  01/16/15   [provider]  loperamide (IMODIUM A-D) 2 MG tablet Take 2 mg by mouth as needed for diarrhea or loose stools (do not exceed 8 doses in 24hrs).    [provider]  loratadine (CLARITIN) 10 MG tablet Take 10 mg by mouth daily.    [provider]  magnesium hydroxide (MILK OF MAGNESIA) 400 MG/5ML suspension Take 30 mLs by mouth at bedtime as needed for mild constipation.    [provider]  neomycin-bacitracin-polymyxin (NEOSPORIN) [phone] ointment Apply 1 application topically as needed (skin cuts).    [provider]  ondansetron (ZOFRAN) 4 MG tablet Take 1 tablet (4 mg total) by mouth every 6 (six) hours as needed for nausea or vomiting. 2/69/48   Delora Fuel, MD  polyethylene glycol [facility] / Floria Raveling) packet Take 17 g by mouth daily. Mix with 8 ounces of fluid and drink     [provider]  sodium chloride (OCEAN) 0.65 % SOLN nasal spray Place 1 spray into both nostrils as needed for congestion.    [provider]    Allergies    Patient has no known allergies.  Review of Systems    Review of Systems  All other systems reviewed and are negative.   Physical Exam Updated Vital Signs BP (!) 162/92 (BP Location: Left Arm)   Pulse 85   Temp 98.1 F (36.7 C) (Oral)   Resp 20   SpO2 97%   Physical Exam Vitals and nursing [name] reviewed.  Constitutional:      General: She is not in acute distress.    Appearance: Normal appearance. She is well-developed. She is not toxic-appearing.  HENT:     Head: Normocephalic and atraumatic.  Eyes:     General: Lids are normal.     Conjunctiva/sclera: Conjunctivae normal.     Pupils: Pupils are equal, round, and reactive to light.  Neck:     Thyroid: No thyroid mass.     Trachea: No tracheal deviation.  Cardiovascular:     Rate and Rhythm: Normal rate and regular rhythm.     Heart sounds: Normal heart sounds. No murmur heard. No gallop.   Pulmonary:     Effort: Pulmonary effort is normal. No respiratory distress.     Breath sounds: Normal breath sounds. No stridor. No decreased breath sounds, wheezing, rhonchi or rales.  Abdominal:     General: Bowel sounds are normal. There is no distension.     Palpations: Abdomen is soft.     Tenderness: There is no abdominal tenderness. There is no rebound.  Musculoskeletal:        General: No tenderness. Normal range of motion.     Cervical back: Normal range of motion and neck supple.  Skin:    General: Skin is warm and dry.     Findings: No abrasion or rash.  Neurological:     Mental Status: She is alert and oriented to person, place, and time.     GCS: GCS eye subscore  is 4. GCS verbal subscore is 5. GCS motor subscore is 6.     Cranial Nerves: Cranial nerves are intact. No cranial nerve deficit.     Sensory: No sensory deficit.     Motor: Motor function is intact.     Comments: Strength is 5 of 5 in upper as well as lower extremities.  Psychiatric:        Speech: Speech normal.        Behavior: Behavior normal.     ED Results / Procedures / Treatments   Labs (all  labs ordered are listed, but only abnormal results are displayed) Labs Reviewed  CBC WITH DIFFERENTIAL/PLATELET  BASIC METABOLIC PANEL    EKG None  Radiology No results found.  Procedures Procedures   Medications Ordered in ED Medications - No data to display  ED Course  I have reviewed the triage vital signs and the nursing notes.  Pertinent labs & imaging results that were available during my care of the patient were reviewed by me and considered in my medical decision making (see chart for details).    MDM Rules/Calculators/A&P                          Patient with evidence of acute kidney injury here with a creatinine up to 1.91.  Head CT without acute findings.  Started IV fluids here in patient does chronically take Lasix.  Will require admission for observation. Final Clinical Impression(s) / ED Diagnoses Final diagnoses:  None    Rx / DC Orders ED Discharge Orders    None       Lacretia Leigh, MD 03/13/21 2015

## 2021-03-13 NOTE — ED Notes (Signed)
Pt was moved to a more comfortable inpatient bed while holding in the ED.

## 2021-03-13 NOTE — H&P (Signed)
History and Physical    [name] ZJQ:[id number] DOB: [birth year] DOA: 03/13/2021  PCP: Shelby Calender, PA-C   Patient coming from: ALF   Chief Complaint: Dizziness   HPI: [name] is a 87 y.o. female with medical history significant for dementia, NICM with ICD, COPD, hypothyroidism, colon cancer status post resection and colostomy, and vertigo, now presenting to emergency department for evaluation of the dizziness.  Patient was experiencing vertigo that led to a fall on 02/27/2021.  She had hit her head, was having a headache, evaluated in the ED, had negative head CT, and return to her ALF.  Dizziness improved for a while before becoming worse again yesterday.  She does not have a headache at this time and denies any additional falls.  She denies any focal numbness or weakness.  She is normally able to ambulate unassisted but unable to do so today due to dizziness, described as though the room is spinning.  Symptoms resolve when she remains still and closes her eyes.   ED Course: Upon arrival to the ED, patient is found to be afebrile, saturating mid 90s on room air, and with stable BP.  Chemistry panel is notable for creatinine of 1.91, up from an apparent baseline of 1.2.  She was given a liter of saline in the ED.  Review of Systems:  All other systems reviewed and apart from HPI, are negative.  Past Medical History:  Diagnosis Date  . Arthritis    "comes and goes" (03/01/2015)  . Asthma   . CHF (congestive heart failure) ([place])   . Colon cancer ([place])   . Colostomy care ([place])   . Complete heart block ([place])   . COPD (chronic obstructive pulmonary disease) ([place])   . Diabetes mellitus without complication ([place])    pt denies this hx on 03/01/2015  . HCAP (healthcare-associated pneumonia) 04/22/2017  . History of blood transfusion 03/01/2015; 04/23/2017   "related to anemia,"  . Hyperlipidemia   . Hypertension   . Hypothyroidism   . Pulmonary embolism ([place])   . Rectal cancer  ypTypN0 (0/13) s/p chemoXRT/ APR/colostomy 2006 07/27/2015   [facility] [place], [place] [address] [phone]  REPORT OF SURGICAL PATHOLOGY  Case #: [id number] Patient Name: Shelby, [name] PID: [id number] Pathologist: Shelby Bridgeman L. Golden Circle, MD DOB/Age December 21, 1933 (Age: 49) Gender: F Date Taken: 11/03/2004 Date Received: 11/03/2004  FINAL DIAGNOSIS  MICROSCOPIC EXAMINATION AND DIAGNOSIS  1. EXCISION, PERIRECTAL TISS    Past Surgical History:  Procedure Laterality Date  . ABDOMINAL HYSTERECTOMY    . ABDOMINOPERINEAL PROCTOCOLECTOMY  2006   [facility]j chemoXRT & open APR Dr Dalbert Batman for low rectal cancer  . ANKLE FRACTURE SURGERY Left   . CARDIAC CATHETERIZATION N/A 03/22/2015   Procedure: Right/Left Heart Cath and Coronary Angiography;  Surgeon: Leonie Man, MD;  Location: [facility];  Service: Cardiovascular;  Laterality: N/A;  . CATARACT EXTRACTION W/ INTRAOCULAR LENS  IMPLANT, BILATERAL Bilateral   . CHOLECYSTECTOMY    . COLOSTOMY  2006   permananet end colostomy after APR resection of low rectal cancer.  Dr Dalbert Batman  . DILATION AND CURETTAGE OF UTERUS    . FRACTURE SURGERY    . ICD IMPLANT  09/23/2012   MDT, implanted for primary prevention by Dr Deno Etienne in [facility]  . LYSIS OF ADHESION  2007   SBO.  Dr Dalbert Batman  . PARASTOMAL HERNIA REPAIR  2007   lysis of adhesions  . TUBAL  LIGATION      Social History:   reports that she has never smoked. She has never used smokeless tobacco. She reports that she does not drink alcohol and does not use drugs.  No Known Allergies  Family History  Problem Relation Age of Onset  . CAD Other   . Hypertension Other   . Stroke Other   . Heart disease Mother        No details  . Colon cancer Neg Hx   . GI Bleed Neg Hx      Prior to Admission medications   Medication Sig Start Date End Date Taking? Authorizing Provider  acetaminophen (TYLENOL) 500 MG tablet Take 500-1,000 mg by mouth See admin  instructions. Take 2 tablets (1000 mg) by mouth every morning, may also take 1 tablet (500  mg) every 6 hours as needed for fever 99.5-101 F, minor headache, minor discomfort   Yes [provider]  alum & mag hydroxide-simeth (MAALOX/MYLANTA) 200-200-20 MG/5ML suspension Take 30 mLs by mouth every 6 (six) hours as needed for heartburn or indigestion.   Yes [provider]  aspirin EC 81 MG tablet Take 1 tablet (81 mg total) by mouth every morning. 03/03/15  Yes Rai, Ripudeep K, MD  atorvastatin (LIPITOR) 20 MG tablet Take 20 mg by mouth at bedtime.    Yes [provider]  carboxymethylcellulose 1 % ophthalmic solution Place 1 drop into both eyes 3 (three) times daily.    Yes [provider]  carvedilol (COREG) 6.25 MG tablet Take 6.25 mg by mouth 2 (two) times daily with a meal.    Yes [provider]  cyclobenzaprine (FLEXERIL) 5 MG tablet Take 5 mg by mouth at bedtime.   Yes [provider]  famotidine (PEPCID) 20 MG tablet Take 1 tablet (20 mg total) by mouth daily. 02/08/20  Yes Annita Brod, MD  fluticasone-salmeterol (ADVAIR) 500-50 MCG/ACT AEPB Inhale 1 puff into the lungs in the morning and at bedtime.   Yes [provider]  furosemide (LASIX) 40 MG tablet Take 40 mg by mouth daily.   Yes [provider]  guaifenesin (ROBITUSSIN) 100 MG/5ML syrup Take 200 mg by mouth every 6 (six) hours as needed for cough.   Yes [provider]  hydrALAZINE (APRESOLINE) 25 MG tablet Take 1 tablet (25 mg total) by mouth every 8 (eight) hours. Patient taking differently: Take 25 mg by mouth 3 (three) times daily. 8am, 2pm, 8pm 02/07/20  Yes Annita Brod, MD  iron polysaccharides (NIFEREX) 150 MG capsule Take 150 mg by mouth 2 (two) times daily.   Yes [provider]  isosorbide mononitrate (IMDUR) 60 MG 24 hr tablet Take 1 tablet (60 mg total) by mouth daily. 02/08/20  Yes Annita Brod, MD  levothyroxine  (SYNTHROID, LEVOTHROID) 50 MCG tablet Take 50 mcg by mouth daily at 6 (six) AM. 01/16/15  Yes [provider]  loperamide (IMODIUM) 2 MG capsule Take 2 mg by mouth as needed for diarrhea or loose stools (max 8 doses in 24 hours).   Yes [provider]  loratadine (CLARITIN) 10 MG tablet Take 10 mg by mouth daily.   Yes [provider]  magnesium hydroxide (MILK OF MAGNESIA) 400 MG/5ML suspension Take 30 mLs by mouth at bedtime as needed for mild constipation.   Yes [provider]  meclizine (ANTIVERT) 25 MG tablet Take 25 mg by mouth every 8 (eight) hours as needed for dizziness.   Yes [provider]  Menthol-Methyl Salicylate (BENGAY ARTHRITIS FORMULA EX) Apply 1 application topically 2 (two) times daily as needed (knee pain).   Yes [provider]  Neomycin-Bacitracin-Polymyxin (TRIPLE ANTIBIOTIC) [phone] OINT Apply 1 application topically daily as needed (minor skin tears/abrasians).   Yes [provider]  nystatin (MYCOSTATIN/NYSTOP) powder Apply 1 application topically See admin instructions. Apply to rash around stoma on stoma care days, when replacing bag - on Mondays   Yes [provider]  ondansetron (ZOFRAN) 4 MG tablet Take 1 tablet (4 mg total) by mouth every 6 (six) hours as needed for nausea or vomiting. 9/56/38  Yes Delora Fuel, MD  polyethylene glycol [facility] / Floria Raveling) packet Take 17 g by mouth daily as needed (constipation). Mix with 8 ounces of fluid and drink   Yes [provider]  sodium chloride (OCEAN) 0.65 % SOLN nasal spray Place 1 spray into both nostrils every 12 (twelve) hours as needed for congestion.   Yes [provider]  vitamin C (ASCORBIC ACID) 250 MG tablet Take 250 mg by mouth 2 (two) times daily.   Yes [provider]    Physical Exam: Vitals:   03/13/21 1654 03/13/21 1815 03/13/21 1945  BP: (!) 162/92 (!) 154/74 (!) 166/75  Pulse: 85 91 95  Resp: 20 (!)  21 (!) 21  Temp: 98.1 F (36.7 C)    TempSrc: Oral    SpO2: 97% 97% 93%    Constitutional: NAD, calm  Eyes: PERTLA, lids and conjunctivae normal ENMT: Mucous membranes are moist. Posterior pharynx clear of any exudate or lesions.   Neck: supple, no masses  Respiratory: no wheezing, no crackles. No accessory muscle use.  Cardiovascular: S1 & S2 heard, regular rate and rhythm. Pretibial pitting edema b/l.  Abdomen: No distension, no tenderness, soft. Bowel sounds active.  Musculoskeletal: no clubbing / cyanosis. No joint deformity upper and lower extremities.   Skin: no significant rashes, lesions, ulcers. Warm, dry, well-perfused. Neurologic: CN 2-12 grossly intact. Sensation intact. Moving all extremities.  Psychiatric: Alert and oriented to person, place, and situation. Very pleasant and cooperative.    Labs and Imaging on Admission: I have personally reviewed following labs and imaging studies  CBC: Recent Labs  Lab 03/13/21 1712  WBC 6.2  NEUTROABS 3.6  HGB 9.3*  HCT 31.0*  MCV 95.4  PLT 756   Basic Metabolic Panel: Recent Labs  Lab 03/13/21 1712  NA 136  K 4.5  CL 100  CO2 27  GLUCOSE 97  BUN 41*  CREATININE 1.91*  CALCIUM 9.1   GFR: CrCl cannot be calculated (Unknown ideal weight.). Liver Function Tests: No results for input(s): AST, ALT, ALKPHOS, BILITOT, PROT, ALBUMIN in the last 168 hours. No results for input(s): LIPASE, AMYLASE in the last 168 hours. No results for input(s): AMMONIA in the last 168 hours. Coagulation Profile: No results for input(s): INR, PROTIME in the last 168 hours. Cardiac Enzymes: No results for input(s): CKTOTAL, CKMB, CKMBINDEX, TROPONINI in the last 168 hours. BNP (last 3 results) No results for input(s): PROBNP in the last 8760 hours. HbA1C: No results for input(s): HGBA1C in the last 72 hours. CBG: No results for input(s): GLUCAP in the last 168 hours. Lipid Profile: No results for input(s): CHOL, HDL, LDLCALC, TRIG,  CHOLHDL, LDLDIRECT in the last 72 hours. Thyroid Function Tests: No results for input(s): TSH, T4TOTAL, FREET4, T3FREE, THYROIDAB in the last 72 hours. Anemia Panel: No results for input(s): VITAMINB12, FOLATE, FERRITIN, TIBC, IRON, RETICCTPCT in the last  72 hours. Urine analysis:    Component Value Date/Time   COLORURINE YELLOW 04/18/2019 1648   APPEARANCEUR HAZY (A) 04/18/2019 1648   LABSPEC 1.012 04/18/2019 1648   PHURINE 5.0 04/18/2019 1648   GLUCOSEU NEGATIVE 04/18/2019 1648   HGBUR NEGATIVE 04/18/2019 1648   BILIRUBINUR NEGATIVE 04/18/2019 1648   KETONESUR NEGATIVE 04/18/2019 1648   PROTEINUR NEGATIVE 04/18/2019 1648   UROBILINOGEN 0.2 07/23/2015 1610   NITRITE NEGATIVE 04/18/2019 1648   LEUKOCYTESUR TRACE (A) 04/18/2019 1648   Sepsis Labs: @LABRCNTIP (procalcitonin:4,lacticidven:4) ) Recent Results (from the past 240 hour(s))  Resp Panel by RT-PCR (Flu A&B, Covid) Nasopharyngeal Swab     Status: None   Collection Time: 03/13/21  6:03 PM   Specimen: Nasopharyngeal Swab; Nasopharyngeal(NP) swabs in vial transport medium  Result Value Ref Range Status   SARS Coronavirus 2 by RT PCR NEGATIVE NEGATIVE Final    Comment: (NOTE) SARS-CoV-2 target nucleic acids are NOT DETECTED.  The SARS-CoV-2 RNA is generally detectable in upper respiratory specimens during the acute phase of infection. The lowest concentration of SARS-CoV-2 viral copies this assay can detect is 138 copies/mL. A negative result does not preclude SARS-Cov-2 infection and should not be used as the sole basis for treatment or other patient management decisions. A negative result may occur with  improper specimen collection/handling, submission of specimen other than nasopharyngeal swab, presence of viral mutation(s) within the areas targeted by this assay, and inadequate number of viral copies(<138 copies/mL). A negative result must be combined with clinical observations, patient history, and  epidemiological information. The expected result is Negative.  Fact Sheet for Patients:  EntrepreneurPulse.com.au  Fact Sheet for Healthcare Providers:  IncredibleEmployment.be  This test is no t yet approved or cleared by the [place] FDA and  has been authorized for detection and/or diagnosis of SARS-CoV-2 by FDA under an Emergency Use Authorization (EUA). This EUA will remain  in effect (meaning this test can be used) for the duration of the COVID-19 declaration under Section 564(b)(1) of the Act, 21 U.S.C.section 360bbb-3(b)(1), unless the authorization is terminated  or revoked sooner.       Influenza A by PCR NEGATIVE NEGATIVE Final   Influenza B by PCR NEGATIVE NEGATIVE Final    Comment: (NOTE) The Xpert Xpress SARS-CoV-2/FLU/RSV plus assay is intended as an aid in the diagnosis of influenza from Nasopharyngeal swab specimens and should not be used as a sole basis for treatment. Nasal washings and aspirates are unacceptable for Xpert Xpress SARS-CoV-2/FLU/RSV testing.  Fact Sheet for Patients: EntrepreneurPulse.com.au  Fact Sheet for Healthcare Providers: IncredibleEmployment.be  This test is not yet approved or cleared by the [place] FDA and has been authorized for detection and/or diagnosis of SARS-CoV-2 by FDA under an Emergency Use Authorization (EUA). This EUA will remain in effect (meaning this test can be used) for the duration of the COVID-19 declaration under Section 564(b)(1) of the Act, 21 U.S.C. section 360bbb-3(b)(1), unless the authorization is terminated or revoked.  Performed at [facility], [place] [address], [place], [place] [address]      Radiological Exams on Admission: CT Head Wo Contrast  Result Date: 03/13/2021 CLINICAL DATA:  Fall EXAM: CT HEAD WITHOUT CONTRAST TECHNIQUE: Contiguous axial images were obtained from the base of the skull  through the vertex without intravenous contrast. COMPARISON:  None. FINDINGS: Brain: There is no mass, hemorrhage or extra-axial collection. The size and configuration of the ventricles and extra-axial CSF spaces are normal. There is hypoattenuation of the white matter,  most commonly indicating chronic small vessel disease. Vascular: No abnormal hyperdensity of the major intracranial arteries or dural venous sinuses. No intracranial atherosclerosis. Skull: The visualized skull base, calvarium and extracranial soft tissues are normal. Sinuses/Orbits: No fluid levels or advanced mucosal thickening of the visualized paranasal sinuses. No mastoid or middle ear effusion. The orbits are normal. IMPRESSION: Chronic small vessel disease without acute intracranial abnormality. Electronically Signed   By: Ulyses Jarred M.D.   On: 03/13/2021 20:11   Assessment/Plan   1. Acute kidney injury superimposed on CKD IIIa  - Presents with vertigo and found to have SCr of 1.91, up from baseline of ~1.2  - She was given a liter of saline in ED  - Check urinalysis and FEUrea, renally-dose medications, repeat chem panel in am    2. Vertigo  - Presents with vertigo that had improved a few days ago before worsening again yesterday  - She has hx of same managed with as-needed meclizine, no other neurologic deficits identified, and negative head CT  - Continue as-needed meclizine, consult PT for eval and tx, hold Flexeril for now    3. Chronic combined systolic & diastolic CHF  - EF was 68-15% with grade III diastolic dysfunction in April 2021  - She appears hypervolemic on admission but without any respiratory signs/symptoms  - She was given a liter of saline in ED in response to elevated SCr and appears to have tolerated, will check FEUrea as above and repeat chem panel in am to see how renal function responds, continue Coreg    4. COPD  - Stable, continue ICS/LABA    5. Hypothyroidism  - Continue Synthroid    6.  Dementia  - Delirium precautions    DVT prophylaxis: sq heparin  Code Status: Full   Level of Care: Level of care: Telemetry Family Communication: Son updated at bedside Disposition Plan:  Patient is from: ALF  Anticipated d/c is to: TBD Anticipated d/c date is: 6/6 or 03/15/21 Patient currently: Pending urine studies, repeat serum chemistries, PT eval  Consults called: None  Admission status: Observation     Vianne Bulls, MD [facility]  03/13/2021, 9:01 PM

## 2021-03-13 NOTE — ED Triage Notes (Signed)
Patient/patient's son reports she fell last week when getting out of bed. She reports she hit the left side of her head on the hardwood floor. She was seen on 5/22 at [facility] and had a CT scan which was negative. She reports she began having headaches and dizziness since yesterday. She describes the headache as throbbing. Hx of dementia

## 2021-03-14 DIAGNOSIS — N1831 Chronic kidney disease, stage 3a: Secondary | ICD-10-CM | POA: Diagnosis not present

## 2021-03-14 DIAGNOSIS — N179 Acute kidney failure, unspecified: Secondary | ICD-10-CM | POA: Diagnosis not present

## 2021-03-14 LAB — BASIC METABOLIC PANEL
Anion gap: 7 (ref 5–15)
BUN: 35 mg/dL — ABNORMAL HIGH (ref 8–23)
CO2: 28 mmol/L (ref 22–32)
Calcium: 9.4 mg/dL (ref 8.9–10.3)
Chloride: 104 mmol/L (ref 98–111)
Creatinine, Ser: 1.37 mg/dL — ABNORMAL HIGH (ref 0.44–1.00)
GFR, Estimated: 38 mL/min — ABNORMAL LOW (ref >60)
Glucose, Bld: 97 mg/dL (ref 70–99)
Potassium: 4 mmol/L (ref 3.5–5.1)
Sodium: 139 mmol/L (ref 135–145)

## 2021-03-14 LAB — BRAIN NATRIURETIC PEPTIDE: B Natriuretic Peptide: 654.2 pg/mL — ABNORMAL HIGH (ref 0.0–100.0)

## 2021-03-14 LAB — CBC
HCT: 31 % — ABNORMAL LOW (ref 36.0–46.0)
Hemoglobin: 9.5 g/dL — ABNORMAL LOW (ref 12.0–15.0)
MCH: 28.9 pg (ref 26.0–34.0)
MCHC: 30.6 g/dL (ref 30.0–36.0)
MCV: 94.2 fL (ref 80.0–100.0)
Platelets: 206 10*3/uL (ref 150–400)
RBC: 3.29 MIL/uL — ABNORMAL LOW (ref 3.87–5.11)
RDW: 14.9 % (ref 11.5–15.5)
WBC: 6.1 10*3/uL (ref 4.0–10.5)
nRBC: 0 % (ref 0.0–0.2)

## 2021-03-14 MED ORDER — HYDRALAZINE HCL 50 MG PO TABS
50.0000 mg | ORAL_TABLET | Freq: Three times a day (TID) | ORAL | Status: DC
  Administered 2021-03-14 – 2021-03-15 (×3): 50 mg via ORAL
  Filled 2021-03-14: qty 1
  Filled 2021-03-14: qty 2
  Filled 2021-03-14: qty 1

## 2021-03-14 MED ORDER — LABETALOL HCL 5 MG/ML IV SOLN
10.0000 mg | Freq: Once | INTRAVENOUS | Status: AC
  Administered 2021-03-14: 10 mg via INTRAVENOUS
  Filled 2021-03-14: qty 4

## 2021-03-14 MED ORDER — FUROSEMIDE 10 MG/ML IJ SOLN
40.0000 mg | Freq: Once | INTRAMUSCULAR | Status: AC
  Administered 2021-03-14: 40 mg via INTRAVENOUS
  Filled 2021-03-14: qty 4

## 2021-03-14 NOTE — ED Notes (Signed)
Patient has labetalol 10 mg IV, Lasix 40 mg IV, Coreg 6.25 mg PO, Hydralazine 25 mg PO and Imdur 24 hr 60 mg PO all due to be given.  Per Tawanna Solo, MD ok to give all at the same time.

## 2021-03-14 NOTE — ED Notes (Signed)
Patient was given her lunch tray. 

## 2021-03-14 NOTE — Progress Notes (Signed)
Notified MD Adhikari about patient not having VTE prophylaxis ordered. MD Adhikari ordered for SCDs to be ordered for lower extremities.

## 2021-03-14 NOTE — Progress Notes (Signed)
PROGRESS NOTE    [name]  HYW:[id number] DOB: 1934/07/14 DOA: 03/13/2021 PCP: Lovett Calender, PA-C   Chief Complain: Dizziness  Brief Narrative: Patient is a 87 year old female with history of dementia, nonischemic cardiomyopathy status post ICD placement, COPD, hypothyroidism colon cancer s/p resection and colostomy placement who presented from assisted living facility with complaints of dizziness.  She had a fall on 02/27/2021, hit her head and was having headache and was seen in the emergency department at the time and was discharged to ALF after CT head was found to be negative.  Her dizziness became worse a day before admission this time, she denied any headache on presentation and no additional fall.  Patient is usually ambulatory with the help of Rollator around ALF and performs her own ADLs.Patient was a started on meclizine after admission.  On presentation lab work also showed AKI on CKD and was given some IV fluids.  PT recommended skilled nursing facilty on discharge.  Assessment & Plan:   Principal Problem:   Acute renal failure superimposed on stage 3a chronic kidney disease (HCC) Active Problems:   COPD (chronic obstructive pulmonary disease) (HCC)   Dementia (HCC)   Anemia   NICM (nonischemic cardiomyopathy) (HCC)   Chronic combined systolic and diastolic CHF (congestive heart failure) (HCC)   Vertigo   Vertigo: Presented with recurrent vertigo.  Happened few days ago when she fell and hit her head at nursing facility.  She was seen at ED, CT head was negative and was discharged.  Vertigo again happened day before admission.  Started on meclizine here. During my evaluation today, she denied any vertigo.  Physical therapy suspected positional vertigo.  Continue meclizine as needed.  Debility/deconditioning/falls: She is a ALF resident.  She walks with the help of Rollator around the facility.  She performs her own ADLs.  PT is recommending skilled nursing  on discharge.   TOC consulted.  We will also order  OT evaluation  AKI on CKD stage IIIa: Baseline creatinine 1.2.  Creatinine was 1.9 on presentation.  Kidney function improved with a liter of saline.  Chronic combined systolic and diastolic congestive heart failure: Echocardiogram done in April 201 showed ejection fraction 45%, grade 3 gastric dysfunction.  She appeared hypervolemic on admission.  She has bilateral lower extremity edema, she has bilateral basal crackles.  She was given a dose of Lasix 40 mg IV once.  Check kidney function tomorrow.  Will check BNP. Patient is status post pacemaker placement.  Hypertension: Patient persistently hypertensive during ED placement.  On Coreg , hydralazine,imdur.  Monitor blood pressure.  Dose of hydralazine increased.  Continue pain medications for severe hypertension.  History of COPD: Currently stable.  Not on exertion.  Continue inhalers, As needed bronchodilators.  On room air.  Hypothyroidism: On Synthyroid  Dementia: Continue supportive care, home medications.  Alert and oriented today.  Delirium precautions  History of colon cancer: S/p resection, status post colostomy placement.         DVT prophylaxis:Handley heparin Code Status: Full Family Communication: Called and discussed with son on phone on 03/14/21 Status is: Observation  The patient remains OBS appropriate and will d/c before 2 midnights.  Dispo: The patient is from: ALF              Anticipated d/c is to: SNF              Patient currently is not medically stable to d/c.   Difficult to place patient No  Consultants: None  Procedures:None  Antimicrobials:  Anti-infectives (From admission, onward)   None      Subjective:  Patient seen and examined at the bedside this morning.  She was hemodynamically stable during my evaluation.  She denies any dizziness or lightheadedness or vertigo when I entered the room.  She was alert and oriented and denies any specific complaints.   She does not want to go back to her previous ALF.   Objective: Vitals:   03/14/21 1045 03/14/21 1100 03/14/21 1130 03/14/21 1200  BP: (!) 150/72 (!) 157/71 (!) 158/59 (!) 160/75  Pulse: 91 91 89 90  Resp: (!) 23 (!) 24 (!) 23 (!) 23  Temp:      TempSrc:      SpO2: 94% 94% 96% 98%    Intake/Output Summary (Last 24 hours) at 03/14/2021 1337 Last data filed at 03/13/2021 2143 Gross per 24 hour  Intake 0.75 ml  Output --  Net 0.75 ml   There were no vitals filed for this visit.  Examination:  General exam: Appears calm and comfortable ,Not in distress, pleasant elderly female HEENT:PERRL,Oral mucosa moist, Ear/Nose normal on gross exam Respiratory system: Bilateral basal crackles  Cardiovascular system: S1 & S2 heard, RRR. No JVD, murmurs, rubs, gallops or clicks.  Gastrointestinal system: Abdomen is nondistended, soft and nontender. No organomegaly or masses felt. Normal bowel sounds heard.  Colostomy Central nervous system: Alert and oriented. No focal neurological deficits. Extremities: 1-2+ bilateral lower extremity pitting edema, no clubbing ,no cyanosis. Skin: No rashes, lesions or ulcers,no icterus ,no pallor      Data Reviewed: I have personally reviewed following labs and imaging studies  CBC: Recent Labs  Lab 03/13/21 1712 03/14/21 0500  WBC 6.2 6.1  NEUTROABS 3.6  --   HGB 9.3* 9.5*  HCT 31.0* 31.0*  MCV 95.4 94.2  PLT 228 277   Basic Metabolic Panel: Recent Labs  Lab 03/13/21 1712 03/14/21 0500  NA 136 139  K 4.5 4.0  CL 100 104  CO2 27 28  GLUCOSE 97 97  BUN 41* 35*  CREATININE 1.91* 1.37*  CALCIUM 9.1 9.4   GFR: CrCl cannot be calculated (Unknown ideal weight.). Liver Function Tests: No results for input(s): AST, ALT, ALKPHOS, BILITOT, PROT, ALBUMIN in the last 168 hours. No results for input(s): LIPASE, AMYLASE in the last 168 hours. No results for input(s): AMMONIA in the last 168 hours. Coagulation Profile: No results for input(s): INR,  PROTIME in the last 168 hours. Cardiac Enzymes: No results for input(s): CKTOTAL, CKMB, CKMBINDEX, TROPONINI in the last 168 hours. BNP (last 3 results) No results for input(s): PROBNP in the last 8760 hours. HbA1C: No results for input(s): HGBA1C in the last 72 hours. CBG: No results for input(s): GLUCAP in the last 168 hours. Lipid Profile: No results for input(s): CHOL, HDL, LDLCALC, TRIG, CHOLHDL, LDLDIRECT in the last 72 hours. Thyroid Function Tests: No results for input(s): TSH, T4TOTAL, FREET4, T3FREE, THYROIDAB in the last 72 hours. Anemia Panel: No results for input(s): VITAMINB12, FOLATE, FERRITIN, TIBC, IRON, RETICCTPCT in the last 72 hours. Sepsis Labs: No results for input(s): PROCALCITON, LATICACIDVEN in the last 168 hours.  Recent Results (from the past 240 hour(s))  Resp Panel by RT-PCR (Flu A&B, Covid) Nasopharyngeal Swab     Status: None   Collection Time: 03/13/21  6:03 PM   Specimen: Nasopharyngeal Swab; Nasopharyngeal(NP) swabs in vial transport medium  Result Value Ref Range Status   SARS Coronavirus 2 by RT  PCR NEGATIVE NEGATIVE Final    Comment: (NOTE) SARS-CoV-2 target nucleic acids are NOT DETECTED.  The SARS-CoV-2 RNA is generally detectable in upper respiratory specimens during the acute phase of infection. The lowest concentration of SARS-CoV-2 viral copies this assay can detect is 138 copies/mL. A negative result does not preclude SARS-Cov-2 infection and should not be used as the sole basis for treatment or other patient management decisions. A negative result may occur with  improper specimen collection/handling, submission of specimen other than nasopharyngeal swab, presence of viral mutation(s) within the areas targeted by this assay, and inadequate number of viral copies(<138 copies/mL). A negative result must be combined with clinical observations, patient history, and epidemiological information. The expected result is Negative.  Fact  Sheet for Patients:  EntrepreneurPulse.com.au  Fact Sheet for Healthcare Providers:  IncredibleEmployment.be  This test is no t yet approved or cleared by the [place] FDA and  has been authorized for detection and/or diagnosis of SARS-CoV-2 by FDA under an Emergency Use Authorization (EUA). This EUA will remain  in effect (meaning this test can be used) for the duration of the COVID-19 declaration under Section 564(b)(1) of the Act, 21 U.S.C.section 360bbb-3(b)(1), unless the authorization is terminated  or revoked sooner.       Influenza A by PCR NEGATIVE NEGATIVE Final   Influenza B by PCR NEGATIVE NEGATIVE Final    Comment: (NOTE) The Xpert Xpress SARS-CoV-2/FLU/RSV plus assay is intended as an aid in the diagnosis of influenza from Nasopharyngeal swab specimens and should not be used as a sole basis for treatment. Nasal washings and aspirates are unacceptable for Xpert Xpress SARS-CoV-2/FLU/RSV testing.  Fact Sheet for Patients: EntrepreneurPulse.com.au  Fact Sheet for Healthcare Providers: IncredibleEmployment.be  This test is not yet approved or cleared by the [place] FDA and has been authorized for detection and/or diagnosis of SARS-CoV-2 by FDA under an Emergency Use Authorization (EUA). This EUA will remain in effect (meaning this test can be used) for the duration of the COVID-19 declaration under Section 564(b)(1) of the Act, 21 U.S.C. section 360bbb-3(b)(1), unless the authorization is terminated or revoked.  Performed at [facility], [place] [address] [place], [place] [address]          Radiology Studies: CT Head Wo Contrast  Result Date: 03/13/2021 CLINICAL DATA:  Fall EXAM: CT HEAD WITHOUT CONTRAST TECHNIQUE: Contiguous axial images were obtained from the base of the skull through the vertex without intravenous contrast. COMPARISON:  None. FINDINGS:  Brain: There is no mass, hemorrhage or extra-axial collection. The size and configuration of the ventricles and extra-axial CSF spaces are normal. There is hypoattenuation of the white matter, most commonly indicating chronic small vessel disease. Vascular: No abnormal hyperdensity of the major intracranial arteries or dural venous sinuses. No intracranial atherosclerosis. Skull: The visualized skull base, calvarium and extracranial soft tissues are normal. Sinuses/Orbits: No fluid levels or advanced mucosal thickening of the visualized paranasal sinuses. No mastoid or middle ear effusion. The orbits are normal. IMPRESSION: Chronic small vessel disease without acute intracranial abnormality. Electronically Signed   By: Ulyses Jarred M.D.   On: 03/13/2021 20:11        Scheduled Meds: . aspirin EC  81 mg Oral Daily  . atorvastatin  20 mg Oral QHS  . carvedilol  6.25 mg Oral BID WC  . famotidine  10 mg Oral Daily  . heparin  5,000 Units Subcutaneous Q8H  . hydrALAZINE  50 mg Oral TID  . isosorbide mononitrate  60 mg Oral Daily  . levothyroxine  50 mcg Oral Q0600  . mometasone-formoterol  2 puff Inhalation BID  . polyvinyl alcohol  1 drop Both Eyes TID   Continuous Infusions: . sodium chloride       LOS: 0 days    Time spent:35 mins. More than 50% of that time was spent in counseling and/or coordination of care.      Shelly Coss, MD [facility] P6/03/2021, 1:37 PM

## 2021-03-14 NOTE — Evaluation (Signed)
Physical Therapy Evaluation Patient Details Name: [name] MRN: [id number] DOB: 09-28-1934 Today's Date: 03/14/2021   History of Present Illness  Pt is a 87 y.o. female who presented to ED for evaluation of the dizziness.  Patient was experiencing vertigo that led to a fall on 02/27/2021.  She had hit her head, was having a headache, evaluated in the ED, had negative head CT, and return to her ALF.  Dizziness improved for a while before becoming worse again yesterday.  She does not have a headache at this time and denies any additional falls.  She denies any focal numbness or weakness.  She is normally able to ambulate unassisted but unable to do so today due to dizziness, described as though the room is spinning.  Symptoms resolve when she remains still and closes her eyes.  Pt admitted for vertigo, CHF, and AKI.  Pt with medical history significant for dementia NICM with ICD, CHF, COPD, hypothyroidism, colon cancer status post resection and colostomy, and vertigo,  Clinical Impression   Pt admitted with above diagnosis. Pt is from ALF and normally ambulatory around facility with rollator and performs her own ADLs.  Has only had the one recent falls.  In regards to mobility, pt presenting with decreased strength, ROM, mobility, and balance - requiring mod A for transfers and only able to take a few steps with assist. In regards to vestibular assessment - evaluation was limited.  Pt reports 2 types of dizziness.  Some that occurs with standing that she contributes to medications.  This dizziness is chronic and longer lasting and does not appear to be related to BPPV.  Orthostatic BP were negative.  She also reports a newer dizziness that occurs when she rolls in bed that only last a few seconds.  This potentially could be BPPV - but unable to verify with testing due to pt with extreme forward head and unable to breath laying flat making positioning difficult for test despite modifications. Pt being  treated with lasix - as fluid removed she may be able to tolerate laying flat for testing. Despite cause of dizziness - pt needs assist for transfers and to ambulate which is below her baseline. Recommend SNF at d/c.  Pt currently with functional limitations due to the deficits listed below (see PT Problem List). Pt will benefit from skilled PT to increase their independence and safety with mobility to allow discharge to the venue listed below.       Follow Up Recommendations SNF    Equipment Recommendations  None recommended by PT    Recommendations for Other Services       Precautions / Restrictions Precautions Precautions: Fall      Mobility  Bed Mobility Overal bed mobility: Needs Assistance Bed Mobility: Supine to Sit;Sit to Supine     Supine to sit: Mod assist;HOB elevated Sit to supine: Mod assist;HOB elevated   General bed mobility comments: Assist for trunk and legs; increased time    Transfers Overall transfer level: Needs assistance Equipment used: Rolling walker (2 wheeled) Transfers: Sit to/from Stand Sit to Stand: Mod assist;From elevated surface         General transfer comment: Increased time , encouragement, use of momentum, and 2 tries to stand  Ambulation/Gait Ambulation/Gait assistance: Min assist Gait Distance (Feet): 3 Feet Assistive device: Rolling walker (2 wheeled) Gait Pattern/deviations: Step-to pattern;Decreased stride length;Shuffle Gait velocity: decraesed   General Gait Details: side steps to EOB: fatigued easily ; min A to steady; did  not ambulate further due to fatigue, dizziness, and would need chair follow  Stairs            Wheelchair Mobility    Modified Rankin (Stroke Patients Only)       Balance Overall balance assessment: Needs assistance Sitting-balance support: No upper extremity supported Sitting balance-Leahy Scale: Good     Standing balance support: Bilateral upper extremity supported Standing  balance-Leahy Scale: Poor Standing balance comment: requiring RW and min A                             Pertinent Vitals/Pain Pain Assessment: No/denies pain    Home Living Family/patient expects to be discharged to:: (P) Assisted living               Home Equipment: (P) Walker - 4 wheels;Shower seat;Grab bars - tub/shower;Grab bars - toilet      Prior Function     Gait / Transfers Assistance Needed: (P) Walks with rollator in room and to dining hall  ADL's / Homemaking Assistance Needed: (P) Pt reports that she does ADLs independently; Facility assist with IADLs, meals, meds  Comments: (P) One fall on 02/27/21 - other than that no falls; and this fall occurred due to dizziness when she was getting OOB; takes Meclizine at times     Hand Dominance        Extremity/Trunk Assessment   Upper Extremity Assessment Upper Extremity Assessment: Overall WFL for tasks assessed    Lower Extremity Assessment Lower Extremity Assessment: LLE deficits/detail;RLE deficits/detail RLE Deficits / Details: ROM: bil knees lacking ~15 degrees ext - reports arthritis; MMT: 4/5; +edema LLE Deficits / Details: ROM: bil knees lacking ~15 degrees ext - reports arthritis; MMT: 4/5; +edema    Cervical / Trunk Assessment Cervical / Trunk Assessment: Kyphotic;Other exceptions Cervical / Trunk Exceptions: forward head - limited neck extension  Communication   Communication: (P) No difficulties  Cognition Arousal/Alertness: Awake/alert Behavior During Therapy: WFL for tasks assessed/performed Overall Cognitive Status: Within Functional Limits for tasks assessed                                        General Comments  Vestibular/Dizziness assessment:  History: Reports long standing history of dizziness that she contributes to all of her medications.  States she has dizziness when she stands up and walking -describes as light room spinning that lingers. Does state that  she also has dizziness when she lays and rolls in bed.  Says this dizziness is different - more intense and only last a few seconds.  As pt when this began, she was unsure but potentially after her recent fall on 02/27/21.  Testing:     Orthostatic BP:       Supine 134/96       Sitting 150/72       Standing 150/120 (? Validity as pt's arm tense on RW)       Supine 157/71       EOEM/Tracking: Intact no nystagmus     Gaze stabilization - difficulty following commands to coordinate this       [employer]:  Difficult due to extreme forward head and pt reports wheezing laying flat.  Of note admitted with CHF and being treated with lasix.  Attempted Har[place] Dingwall with bed tilted and pillows under shoulder to accomodate  for forward head but pt still with difficulty achieving 30 degrees below horizontal with head and could not tolerate the positions due to difficulty breathing.  Some reports of dizziness but more limited due to breathing.      Exercises     Assessment/Plan    PT Assessment Patient needs continued PT services  PT Problem List Decreased strength;Decreased mobility;Decreased range of motion;Decreased activity tolerance;Cardiopulmonary status limiting activity;Decreased balance;Decreased knowledge of use of DME       PT Treatment Interventions DME instruction;Therapeutic exercise;Gait training;Balance training;Neuromuscular re-education;Functional mobility training;Other (comment);Therapeutic activities;Patient/family education (vestibular - habituation, modified Epleys as needed)    PT Goals (Current goals can be found in the Care Plan section)  Acute Rehab PT Goals Patient Stated Goal: returnto walking PT Goal Formulation: With patient Time For Goal Achievement: 03/28/21 Potential to Achieve Goals: Good    Frequency Min 2X/week   Barriers to discharge        Co-evaluation               AM-PAC PT "6 Clicks" Mobility  Outcome Measure Help needed turning from  your back to your side while in a flat bed without using bedrails?: A Little Help needed moving from lying on your back to sitting on the side of a flat bed without using bedrails?: A Lot Help needed moving to and from a bed to a chair (including a wheelchair)?: A Lot Help needed standing up from a chair using your arms (e.g., wheelchair or bedside chair)?: A Lot Help needed to walk in hospital room?: A Lot Help needed climbing 3-5 steps with a railing? : A Lot 6 Click Score: 13    End of Session Equipment Utilized During Treatment: Gait belt Activity Tolerance: Patient limited by fatigue;Other (comment) (limited vestibular testing due to breathing difficulites) Patient left: in bed;with call bell/phone within reach (in ED) Nurse Communication: Mobility status PT Visit Diagnosis: Unsteadiness on feet (R26.81);Dizziness and giddiness (R42);Muscle weakness (generalized) (M62.81)    Time: 1030-1104 PT Time Calculation (min) (ACUTE ONLY): 34 min   Charges:   PT Evaluation $PT Eval Moderate Complexity: 1 Mod PT Treatments $Therapeutic Activity: 8-22 mins        Abran Richard, PT Acute Rehab Services Pager [phone] [facility] [phone]    Karlton Lemon 03/14/2021, 11:21 AM

## 2021-03-14 NOTE — Progress Notes (Deleted)
Electrophysiology Office Note Date: 03/14/2021  ID:  [name], DOB 1934-05-19, MRN [id number]  PCP: Lovett Calender, PA-C Primary Cardiologist: Minus Breeding, MD Electrophysiologist: Thompson Grayer, MD   CC: Routine ICD follow-up  [name] is a 87 y.o. female with a h/o COPD, dementia, colon CA s/p resection, bradycardia sp ICD (MDT) in [place] by Dr Deno Etienne for nonischemic CM (EF 40%) 09/2012 seen today for Thompson Grayer, MD for routine electrophysiology followup.  Since last being seen in our clinic the patient reports doing ***.  she denies chest pain, palpitations, dyspnea, PND, orthopnea, nausea, vomiting, dizziness, syncope, edema, weight gain, or early satiety. He has not had ICD shocks.   Device History: Medtronic Dual Chamber ICD implanted 2013 for NICM  Past Medical History:  Diagnosis Date  . Arthritis    "comes and goes" (03/01/2015)  . Asthma   . CHF (congestive heart failure) ([place])   . Colon cancer ([place])   . Colostomy care ([place])   . Complete heart block ([place])   . COPD (chronic obstructive pulmonary disease) ([place])   . Diabetes mellitus without complication ([place])    pt denies this hx on 03/01/2015  . HCAP (healthcare-associated pneumonia) 04/22/2017  . History of blood transfusion 03/01/2015; 04/23/2017   "related to anemia,"  . Hyperlipidemia   . Hypertension   . Hypothyroidism   . Pulmonary embolism ([place])   . Rectal cancer ypTypN0 (0/13) s/p chemoXRT/ APR/colostomy 2006 07/27/2015   [facility] [place], [place] [address] [phone]  REPORT OF SURGICAL PATHOLOGY  Case #: [id number] Patient Name: Shelby, [name] PID: [id number] Pathologist: Shelby Bridgeman L. Golden Circle, MD DOB/Age [birth year] (Age: 71) Gender: F Date Taken: 11/03/2004 Date Received: 11/03/2004  FINAL DIAGNOSIS  MICROSCOPIC EXAMINATION AND DIAGNOSIS  1. EXCISION, PERIRECTAL TISS   Past Surgical History:  Procedure Laterality Date  . ABDOMINAL HYSTERECTOMY    .  ABDOMINOPERINEAL PROCTOCOLECTOMY  2006   [place] chemoXRT & open APR Dr Dalbert Batman for low rectal cancer  . ANKLE FRACTURE SURGERY Left   . CARDIAC CATHETERIZATION N/A 03/22/2015   Procedure: Right/Left Heart Cath and Coronary Angiography;  Surgeon: Leonie Man, MD;  Location: [facility];  Service: Cardiovascular;  Laterality: N/A;  . CATARACT EXTRACTION W/ INTRAOCULAR LENS  IMPLANT, BILATERAL Bilateral   . CHOLECYSTECTOMY    . COLOSTOMY  2006   permananet end colostomy after APR resection of low rectal cancer.  Dr Dalbert Batman  . DILATION AND CURETTAGE OF UTERUS    . FRACTURE SURGERY    . ICD IMPLANT  09/23/2012   MDT, implanted for primary prevention by Dr Deno Etienne in [facility]  . LYSIS OF ADHESION  2007   SBO.  Dr Dalbert Batman  . PARASTOMAL HERNIA REPAIR  2007   lysis of adhesions  . TUBAL LIGATION      No current facility-administered medications for this visit.   Current Outpatient Medications  Medication Sig Dispense Refill  . acetaminophen (TYLENOL) 500 MG tablet Take 500-1,000 mg by mouth See admin instructions. Take 2 tablets (1000 mg) by mouth every morning, may also take 1 tablet (500  mg) every 6 hours as needed for fever 99.5-101 F, minor headache, minor discomfort    . alum & mag hydroxide-simeth (MAALOX/MYLANTA) 200-200-20 MG/5ML suspension Take 30 mLs by mouth every 6 (six) hours as needed for heartburn or indigestion.    Marland Kitchen aspirin EC 81 MG tablet Take 1 tablet (81 mg total) by mouth every  morning.    Marland Kitchen atorvastatin (LIPITOR) 20 MG tablet Take 20 mg by mouth at bedtime.     . carboxymethylcellulose 1 % ophthalmic solution Place 1 drop into both eyes 3 (three) times daily.     . carvedilol (COREG) 6.25 MG tablet Take 6.25 mg by mouth 2 (two) times daily with a meal.     . cyclobenzaprine (FLEXERIL) 5 MG tablet Take 5 mg by mouth at bedtime.    . famotidine (PEPCID) 20 MG tablet Take 1 tablet (20 mg total) by mouth daily. 30 tablet 1  . fluticasone-salmeterol (ADVAIR) 500-50  MCG/ACT AEPB Inhale 1 puff into the lungs in the morning and at bedtime.    . furosemide (LASIX) 40 MG tablet Take 40 mg by mouth daily.    Marland Kitchen guaifenesin (ROBITUSSIN) 100 MG/5ML syrup Take 200 mg by mouth every 6 (six) hours as needed for cough.    . hydrALAZINE (APRESOLINE) 25 MG tablet Take 1 tablet (25 mg total) by mouth every 8 (eight) hours. (Patient taking differently: Take 25 mg by mouth 3 (three) times daily. 8am, 2pm, 8pm) 90 tablet 1  . iron polysaccharides (NIFEREX) 150 MG capsule Take 150 mg by mouth 2 (two) times daily.    . isosorbide mononitrate (IMDUR) 60 MG 24 hr tablet Take 1 tablet (60 mg total) by mouth daily. 30 tablet 1  . levothyroxine (SYNTHROID, LEVOTHROID) 50 MCG tablet Take 50 mcg by mouth daily at 6 (six) AM.  2  . loperamide (IMODIUM) 2 MG capsule Take 2 mg by mouth as needed for diarrhea or loose stools (max 8 doses in 24 hours).    . loratadine (CLARITIN) 10 MG tablet Take 10 mg by mouth daily.    . magnesium hydroxide (MILK OF MAGNESIA) 400 MG/5ML suspension Take 30 mLs by mouth at bedtime as needed for mild constipation.    . meclizine (ANTIVERT) 25 MG tablet Take 25 mg by mouth every 8 (eight) hours as needed for dizziness.    . Menthol-Methyl Salicylate (BENGAY ARTHRITIS FORMULA EX) Apply 1 application topically 2 (two) times daily as needed (knee pain).    . Neomycin-Bacitracin-Polymyxin (TRIPLE ANTIBIOTIC) [phone] OINT Apply 1 application topically daily as needed (minor skin tears/abrasians).    . nystatin (MYCOSTATIN/NYSTOP) powder Apply 1 application topically See admin instructions. Apply to rash around stoma on stoma care days, when replacing bag - on Mondays    . ondansetron (ZOFRAN) 4 MG tablet Take 1 tablet (4 mg total) by mouth every 6 (six) hours as needed for nausea or vomiting. 12 tablet 0  . polyethylene glycol (MIRALAX / GLYCOLAX) packet Take 17 g by mouth daily as needed (constipation). Mix with 8 ounces of fluid and drink    . sodium chloride  (OCEAN) 0.65 % SOLN nasal spray Place 1 spray into both nostrils every 12 (twelve) hours as needed for congestion.    . vitamin C (ASCORBIC ACID) 250 MG tablet Take 250 mg by mouth 2 (two) times daily.     Facility-Administered Medications Ordered in Other Visits  Medication Dose Route Frequency Provider Last Rate Last Admin  . 0.9 %  sodium chloride infusion  250 mL Intravenous PRN Opyd, Ilene Qua, MD      . acetaminophen (TYLENOL) tablet 650 mg  650 mg Oral Q6H PRN Opyd, Ilene Qua, MD       Or  . acetaminophen (TYLENOL) suppository 650 mg  650 mg Rectal Q6H PRN Opyd, Ilene Qua, MD      .  aspirin EC tablet 81 mg  81 mg Oral Daily Opyd, Ilene Qua, MD   81 mg at 03/14/21 1029  . atorvastatin (LIPITOR) tablet 20 mg  20 mg Oral QHS Opyd, Ilene Qua, MD   20 mg at 03/14/21 0053  . carvedilol (COREG) tablet 6.25 mg  6.25 mg Oral BID WC Opyd, Ilene Qua, MD   6.25 mg at 03/14/21 1029  . famotidine (PEPCID) tablet 10 mg  10 mg Oral Daily Opyd, Ilene Qua, MD   10 mg at 03/14/21 1029  . guaiFENesin (ROBITUSSIN) 100 MG/5ML solution 200 mg  200 mg Oral Q6H PRN Opyd, Ilene Qua, MD      . heparin injection 5,000 Units  5,000 Units Subcutaneous Q8H Opyd, Ilene Qua, MD   5,000 Units at 03/14/21 0053  . hydrALAZINE (APRESOLINE) tablet 50 mg  50 mg Oral TID Shelly Coss, MD      . isosorbide mononitrate (IMDUR) 24 hr tablet 60 mg  60 mg Oral Daily Opyd, Ilene Qua, MD   60 mg at 03/14/21 1029  . levothyroxine (SYNTHROID) tablet 50 mcg  50 mcg Oral Q0600 Vianne Bulls, MD   50 mcg at 03/14/21 0630  . meclizine (ANTIVERT) tablet 25 mg  25 mg Oral Q8H PRN Opyd, Ilene Qua, MD      . mometasone-formoterol (DULERA) 200-5 MCG/ACT inhaler 2 puff  2 puff Inhalation BID Opyd, Ilene Qua, MD   2 puff at 03/14/21 1027  . ondansetron (ZOFRAN) tablet 4 mg  4 mg Oral Q6H PRN Opyd, Ilene Qua, MD       Or  . ondansetron (ZOFRAN) injection 4 mg  4 mg Intravenous Q6H PRN Opyd, Ilene Qua, MD      . polyvinyl alcohol (LIQUIFILM  TEARS) 1.4 % ophthalmic solution 1 drop  1 drop Both Eyes TID Opyd, Ilene Qua, MD   1 drop at 03/14/21 1028  . senna-docusate (Senokot-S) tablet 1 tablet  1 tablet Oral QHS PRN Opyd, Ilene Qua, MD      . sodium chloride (OCEAN) 0.65 % nasal spray 1 spray  1 spray Each Nare Q12H PRN Opyd, Ilene Qua, MD        Allergies:   Patient has no known allergies.   Social History: Social History   Socioeconomic History  . Marital status: Divorced    Spouse name: Not on file  . Number of children: Not on file  . Years of education: Not on file  . Highest education level: Not on file  Occupational History  . Occupation: Retired Psychologist, counselling  Tobacco Use  . Smoking status: Never Smoker  . Smokeless tobacco: Never Used  Vaping Use  . Vaping Use: Never used  Substance and Sexual Activity  . Alcohol use: No  . Drug use: No  . Sexual activity: Not Currently  Other Topics Concern  . Not on file  Social History Narrative   12/19 was a resident of assisted living.  Three sons.  Divorced.   Social Determinants of Health   Financial Resource Strain: Not on file  Food Insecurity: Not on file  Transportation Needs: Not on file  Physical Activity: Not on file  Stress: Not on file  Social Connections: Not on file  Intimate Partner Violence: Not on file    Family History: Family History  Problem Relation Age of Onset  . CAD Other   . Hypertension Other   . Stroke Other   . Heart disease Mother  No details  . Colon cancer Neg Hx   . GI Bleed Neg Hx     Review of Systems: All other systems reviewed and are otherwise negative except as noted above.   Physical Exam: There were no vitals filed for this visit.   GEN- The patient is well appearing, alert and oriented x 3 today.   HEENT: normocephalic, atraumatic; sclera clear, conjunctiva pink; hearing intact; oropharynx clear; neck supple, no JVP Lymph- no cervical lymphadenopathy Lungs- Clear to ausculation bilaterally,  normal work of breathing.  No wheezes, rales, rhonchi Heart- Regular rate and rhythm, no murmurs, rubs or gallops, PMI not laterally displaced GI- soft, non-tender, non-distended, bowel sounds present, no hepatosplenomegaly Extremities- no clubbing or cyanosis. No edema; DP/PT/radial pulses 2+ bilaterally MS- no significant deformity or atrophy Skin- warm and dry, no rash or lesion; ICD pocket well healed Psych- euthymic mood, full affect Neuro- strength and sensation are intact  ICD interrogation- reviewed in detail today,  See PACEART report  EKG:  EKG is not ordered today. The ekg ordered today shows ***  Recent Labs: 05/10/2020: BNP 309.6 03/14/2021: BUN 35; Creatinine, Ser 1.37; Hemoglobin 9.5; Platelets 206; Potassium 4.0; Sodium 139   Wt Readings from Last 3 Encounters:  02/27/21 205 lb (93 kg)  08/16/20 207 lb (93.9 kg)  05/10/20 (!) 205 lb 12.8 oz (93.4 kg)     Other studies Reviewed: Additional studies/ records that were reviewed today include: Previous EP office notes and recent ED notes   Assessment and Plan:  1.  Chronic systolic dysfunction s/p Medtronic dual chamber ICD *** euvolemic today Stable on an appropriate medical regimen Normal ICD function See Pace Art report No changes today  2. HTN  Stable  3. CHB *** dependent today.  Stable device function   Current medicines are reviewed at length with the patient today.   The patient {ACTIONS; HAS/DOES NOT HAVE:19233} concerns regarding her medicines.  The following changes were made today:  {NONE DEFAULTED:18576::"none"}  Labs/ tests ordered today include: *** No orders of the defined types were placed in this encounter.    Disposition:   Follow up with {Blank single:19197::"Dr. Allred","Dr. Arlan Organ. Klein","Dr. Camnitz","Dr. Lambert","EP APP"}  {gen number [id number]} {TIME; UNITS DAY/WEEK/MONTH:19136}   Signed, Shirley Friar, PA-C  03/14/2021 2:35 PM  [place] [address] [phone] (office) [phone] (fax)

## 2021-03-15 ENCOUNTER — Encounter: Payer: Medicare HMO | Admitting: Student

## 2021-03-15 DIAGNOSIS — N179 Acute kidney failure, unspecified: Secondary | ICD-10-CM | POA: Diagnosis not present

## 2021-03-15 DIAGNOSIS — N1831 Chronic kidney disease, stage 3a: Secondary | ICD-10-CM | POA: Diagnosis not present

## 2021-03-15 LAB — CBC WITH DIFFERENTIAL/PLATELET
Abs Immature Granulocytes: 0.02 10*3/uL (ref 0.00–0.07)
Basophils Absolute: 0 10*3/uL (ref 0.0–0.1)
Basophils Relative: 1 %
Eosinophils Absolute: 0 10*3/uL (ref 0.0–0.5)
Eosinophils Relative: 0 %
HCT: 32.7 % — ABNORMAL LOW (ref 36.0–46.0)
Hemoglobin: 10 g/dL — ABNORMAL LOW (ref 12.0–15.0)
Immature Granulocytes: 0 %
Lymphocytes Relative: 22 %
Lymphs Abs: 1.8 10*3/uL (ref 0.7–4.0)
MCH: 28.5 pg (ref 26.0–34.0)
MCHC: 30.6 g/dL (ref 30.0–36.0)
MCV: 93.2 fL (ref 80.0–100.0)
Monocytes Absolute: 0.9 10*3/uL (ref 0.1–1.0)
Monocytes Relative: 11 %
Neutro Abs: 5.4 10*3/uL (ref 1.7–7.7)
Neutrophils Relative %: 66 %
Platelets: 220 10*3/uL (ref 150–400)
RBC: 3.51 MIL/uL — ABNORMAL LOW (ref 3.87–5.11)
RDW: 14.9 % (ref 11.5–15.5)
WBC: 8.1 10*3/uL (ref 4.0–10.5)
nRBC: 0 % (ref 0.0–0.2)

## 2021-03-15 LAB — BASIC METABOLIC PANEL
Anion gap: 12 (ref 5–15)
BUN: 21 mg/dL (ref 8–23)
CO2: 25 mmol/L (ref 22–32)
Calcium: 9.7 mg/dL (ref 8.9–10.3)
Chloride: 101 mmol/L (ref 98–111)
Creatinine, Ser: 1.14 mg/dL — ABNORMAL HIGH (ref 0.44–1.00)
GFR, Estimated: 47 mL/min — ABNORMAL LOW (ref >60)
Glucose, Bld: 96 mg/dL (ref 70–99)
Potassium: 3.6 mmol/L (ref 3.5–5.1)
Sodium: 138 mmol/L (ref 135–145)

## 2021-03-15 LAB — UREA NITROGEN, URINE: Urea Nitrogen, Ur: 502 mg/dL

## 2021-03-15 MED ORDER — HYDRALAZINE HCL 50 MG PO TABS: 50.0000 mg | ORAL_TABLET | Freq: Three times a day (TID) | ORAL | Status: DC

## 2021-03-15 NOTE — NC FL2 (Signed)
[place] MEDICAID FL2 LEVEL OF CARE SCREENING TOOL     IDENTIFICATION  Patient Name: [name] Birthdate: 10-Feb-1934 Sex: female Admission Date (Current Location): 03/13/2021  [facility] and [place] Number:  Herbalist and Address:  [facility],  [place] [address], [place]      Provider Number: [id number]  Attending Physician Name and Address:  Shelly Coss, MD  Relative Name and Phone Number:  Leatha Gilding)   [phone]    Current Level of Care: Hospital Recommended Level of Care: [place] Prior Approval Number:    Date Approved/Denied:   PASRR Number:    Discharge Plan: Domiciliary (Rest home) [facility])    Current Diagnoses: Patient Active Problem List   Diagnosis Date Noted  . Acute renal failure superimposed on stage 3a chronic kidney disease ([place]) 03/13/2021  . Vertigo 03/13/2021  . Chronic combined systolic and diastolic CHF (congestive heart failure) ([place]) 04/18/2019  . Personal history of gout 08/28/2018  . Recurrent parastomal hernia at LLQ colostomy 08/21/2018  . SBO (small bowel obstruction) ([place]) 08/21/2018  . Chest pain 10/28/2017  . Aspiration of liquid 10/28/2017  . Bilateral lower extremity edema   . Pneumonia 04/22/2017  . Altered mental status   . Hypotension 02/16/2017  . Syncope 02/02/2016  . Faintness   . Hypothyroidism 11/16/2015  . Acute on chronic combined systolic and diastolic CHF  25/02/3975  . Hypertensive heart disease 10/04/2015  . Elevated troponin 10/04/2015  . Acute respiratory failure ([place])   . NICM (nonischemic cardiomyopathy) ([place]) 08/11/2015  . Normal coronary arteries 08/11/2015  . Dyslipidemia 07/27/2015  . CKD (chronic kidney disease) stage 3, GFR 30-59 ml/min 07/27/2015  . Anemia 07/27/2015  . FUO (fever of unknown origin) 07/27/2015  . IDA (iron deficiency anemia) 07/27/2015  . Rectal cancer ypTypN0 (0/13) s/p chemoXRT/ APR/colostomy 2006 07/27/2015   . Sepsis ([place]) 07/24/2015  . Acute encephalopathy 07/24/2015  . Hypokalemia 07/24/2015  . Dementia ([place]) 07/23/2015  . COPD (chronic obstructive pulmonary disease) ([place]) 04/25/2015  . ICD in place 04/14/2015  . Colostomy in place [facility]) 03/01/2015  . Pacemaker 11/07/2012  . Anxiety 10/04/2011    Orientation RESPIRATION BLADDER Height & Weight     Self,Time,Situation,Place  Normal Incontinent Weight: 94.3 kg Height:     BEHAVIORAL SYMPTOMS/MOOD NEUROLOGICAL BOWEL NUTRITION STATUS   (none)  (none) Colostomy Diet (Regular diet)  AMBULATORY STATUS COMMUNICATION OF NEEDS Skin   Limited Assist Verbally Normal                       Personal Care Assistance Level of Assistance  Bathing,Feeding,Dressing Bathing Assistance: Limited assistance Feeding assistance: Independent Dressing Assistance: Limited assistance     Functional Limitations Info  Sight,Hearing,Speech Sight Info: Adequate Hearing Info: Adequate Speech Info: Adequate    SPECIAL CARE FACTORS FREQUENCY  PT (By licensed PT),OT (By licensed OT)     PT Frequency: 2-3X weekly OT Frequency: 2-3X weekly            Contractures Contractures Info: Not present    Additional Factors Info  Code Status,Allergies Code Status Info: Full Allergies Info: NKA           Current Medications (03/15/2021):  This is the current hospital active medication list Current Facility-Administered Medications  Medication Dose Route Frequency Provider Last Rate Last Admin  . 0.9 %  sodium chloride infusion  250 mL Intravenous PRN Opyd, Ilene Qua, MD      .  acetaminophen (TYLENOL) tablet 650 mg  650 mg Oral Q6H PRN Opyd, Ilene Qua, MD   650 mg at 03/15/21 0445   Or  . acetaminophen (TYLENOL) suppository 650 mg  650 mg Rectal Q6H PRN Opyd, Ilene Qua, MD      . aspirin EC tablet 81 mg  81 mg Oral Daily Opyd, Ilene Qua, MD   81 mg at 03/15/21 0837  . atorvastatin (LIPITOR) tablet 20 mg  20 mg Oral QHS Opyd, Ilene Qua, MD   20 mg at  03/14/21 2100  . carvedilol (COREG) tablet 6.25 mg  6.25 mg Oral BID WC Opyd, Ilene Qua, MD   6.25 mg at 03/15/21 0837  . famotidine (PEPCID) tablet 10 mg  10 mg Oral Daily Opyd, Ilene Qua, MD   10 mg at 03/15/21 0837  . guaiFENesin (ROBITUSSIN) 100 MG/5ML solution 200 mg  200 mg Oral Q6H PRN Opyd, Ilene Qua, MD   200 mg at 03/15/21 0451  . heparin injection 5,000 Units  5,000 Units Subcutaneous Q8H Opyd, Ilene Qua, MD   5,000 Units at 03/15/21 0526  . hydrALAZINE (APRESOLINE) tablet 50 mg  50 mg Oral TID Shelly Coss, MD   50 mg at 03/15/21 0837  . isosorbide mononitrate (IMDUR) 24 hr tablet 60 mg  60 mg Oral Daily Opyd, Ilene Qua, MD   60 mg at 03/15/21 0837  . levothyroxine (SYNTHROID) tablet 50 mcg  50 mcg Oral Q0600 Vianne Bulls, MD   50 mcg at 03/15/21 0526  . meclizine (ANTIVERT) tablet 25 mg  25 mg Oral Q8H PRN Opyd, Ilene Qua, MD      . mometasone-formoterol (DULERA) 200-5 MCG/ACT inhaler 2 puff  2 puff Inhalation BID Opyd, Ilene Qua, MD   2 puff at 03/15/21 0817  . ondansetron (ZOFRAN) tablet 4 mg  4 mg Oral Q6H PRN Opyd, Ilene Qua, MD       Or  . ondansetron (ZOFRAN) injection 4 mg  4 mg Intravenous Q6H PRN Opyd, Ilene Qua, MD      . polyvinyl alcohol (LIQUIFILM TEARS) 1.4 % ophthalmic solution 1 drop  1 drop Both Eyes TID Opyd, Ilene Qua, MD   1 drop at 03/15/21 0838  . senna-docusate (Senokot-S) tablet 1 tablet  1 tablet Oral QHS PRN Opyd, Ilene Qua, MD      . sodium chloride (OCEAN) 0.65 % nasal spray 1 spray  1 spray Each Nare Q12H PRN Opyd, Ilene Qua, MD         Discharge Medications: Please see discharge summary for a list of discharge medications.  Relevant Imaging Results:  Relevant Lab Results:   Additional Information ssn: [phone]  [place], [place]

## 2021-03-15 NOTE — Plan of Care (Signed)
  Problem: Education: Goal: Knowledge of disease and its progression will improve Outcome: Progressing Goal: Individualized Educational Video(s) Outcome: Progressing   Problem: Fluid Volume: Goal: Compliance with measures to maintain balanced fluid volume will improve Outcome: Progressing   Problem: Health Behavior/Discharge Planning: Goal: Ability to manage health-related needs will improve Outcome: Progressing   Problem: Nutritional: Goal: Ability to make healthy dietary choices will improve Outcome: Progressing   Problem: Clinical Measurements: Goal: Complications related to the disease process, condition or treatment will be avoided or minimized Outcome: Progressing   Problem: Education: Goal: Ability to demonstrate management of disease process will improve Outcome: Progressing Goal: Ability to verbalize understanding of medication therapies will improve Outcome: Progressing Goal: Individualized Educational Video(s) Outcome: Progressing   Problem: Activity: Goal: Capacity to carry out activities will improve Outcome: Progressing   Problem: Cardiac: Goal: Ability to achieve and maintain adequate cardiopulmonary perfusion will improve Outcome: Progressing

## 2021-03-15 NOTE — Discharge Summary (Signed)
Physician Discharge Summary  [name] EQA:[id number] DOB: [birth year] DOA: 03/13/2021  PCP: Lovett Calender, PA-C  Admit date: 03/13/2021 Discharge date: 03/15/2021  Admitted From: ALF Disposition:  ALF  Discharge Condition:Stable CODE STATUS:FULL Diet recommendation: Heart Healthy   Brief/Interim Summary: Patient is a 87 year old female with history of dementia, nonischemic cardiomyopathy status post ICD placement, COPD, hypothyroidism colon cancer s/p resection and colostomy placement who presented from assisted living facility with complaints of dizziness.  She had a fall on 02/27/2021, hit her head and was having headache and was seen in the emergency department at the time and was discharged to ALF after CT head was found to be negative.  Her dizziness became worse a day before admission this time, she denied any headache on presentation and no additional fall.  Patient is usually ambulatory with the help of Rollator around ALF and performs her own ADLs.Patient was a started on meclizine after admission.  On presentation lab work also showed AKI on CKD and was given some IV fluids.  This morning she is hemodynamically stable.  She denies any dizziness or vertigo.  Kidney function significantly improved.  Patient seen by OT and PT and recommended going back to ALF with physical therapist as a course of therapy.  She is medically stable for discharge today.  Following problems were addressed during her  Hospitalization:  Vertigo: Presented with recurrent vertigo.  Happened few days ago when she fell and hit her head at nursing facility.  She was seen at ED, CT head was negative and was discharged.  Vertigo again happened day before admission.  Started on meclizine here. Currently she is free of vertigo. physical therapy suspected positional vertigo.  Continue meclizine as needed.  Debility/deconditioning/falls: She is a ALF resident.  She walks with the help of Rollator around the facility.   She performs her own ADLs.  PT/OT recommended home health on discharge.  AKI on CKD stage IIIa: Baseline creatinine 1.2.  Creatinine was 1.9 on presentation.  Kidney function now improved and is at baseline.  Chronic combined systolic and diastolic congestive heart failure: Echocardiogram done in April 201 showed ejection fraction 97%, grade 3 diastolic  dysfunction.  She appeared hypervolemic on admission.  She had bilateral lower extremity edema, she had bilateral basal crackles.  She was given a dose of Lasix 40 mg IV once with significant diuresis and improvement in the kidney function.  Patient is status post pacemaker placement.  We will continue Lasix 40 mg daily  Hypertension: .  On Coreg , hydralazine,imdur.  Monitor blood pressure.  Dose of hydralazine increased.  Continue to adjust the medications at ALF if she remains hypertensive.  History of COPD: Currently stable.  Not on exertion.  Continue inhalers, As needed bronchodilators.  On room air.  Hypothyroidism: On Synthyroid  Dementia: Continue supportive care, home medications.  Alert and oriented today.   History of colon cancer: S/p resection, status post colostomy placement.   Discharge Diagnoses:  Principal Problem:   Acute renal failure superimposed on stage 3a chronic kidney disease (HCC) Active Problems:   COPD (chronic obstructive pulmonary disease) (HCC)   Dementia (HCC)   Anemia   NICM (nonischemic cardiomyopathy) (HCC)   Chronic combined systolic and diastolic CHF (congestive heart failure) ([place])   Vertigo    Discharge Instructions  Discharge Instructions    Diet - low sodium heart healthy   Complete by: As directed    Discharge instructions   Complete by: As directed  1)Please take medications as instructed 2)Monitor your blood pressure 3)Do a BMP test in a week   Increase activity slowly   Complete by: As directed      Allergies as of 03/15/2021   No Known Allergies     Medication List     TAKE these medications   acetaminophen 500 MG tablet Commonly known as: TYLENOL Take 500-1,000 mg by mouth See admin instructions. Take 2 tablets (1000 mg) by mouth every morning, may also take 1 tablet (500  mg) every 6 hours as needed for fever 99.5-101 F, minor headache, minor discomfort   alum & mag hydroxide-simeth 200-200-20 MG/5ML suspension Commonly known as: MAALOX/MYLANTA Take 30 mLs by mouth every 6 (six) hours as needed for heartburn or indigestion.   aspirin EC 81 MG tablet Take 1 tablet (81 mg total) by mouth every morning.   atorvastatin 20 MG tablet Commonly known as: LIPITOR Take 20 mg by mouth at bedtime.   BENGAY ARTHRITIS FORMULA EX Apply 1 application topically 2 (two) times daily as needed (knee pain).   carboxymethylcellulose 1 % ophthalmic solution Place 1 drop into both eyes 3 (three) times daily.   carvedilol 6.25 MG tablet Commonly known as: COREG Take 6.25 mg by mouth 2 (two) times daily with a meal.   cyclobenzaprine 5 MG tablet Commonly known as: FLEXERIL Take 5 mg by mouth at bedtime.   famotidine 20 MG tablet Commonly known as: PEPCID Take 1 tablet (20 mg total) by mouth daily.   fluticasone-salmeterol 500-50 MCG/ACT Aepb Commonly known as: ADVAIR Inhale 1 puff into the lungs in the morning and at bedtime.   furosemide 40 MG tablet Commonly known as: LASIX Take 40 mg by mouth daily.   guaifenesin 100 MG/5ML syrup Commonly known as: ROBITUSSIN Take 200 mg by mouth every 6 (six) hours as needed for cough.   hydrALAZINE 50 MG tablet Commonly known as: APRESOLINE Take 1 tablet (50 mg total) by mouth 3 (three) times daily. What changed:   medication strength  how much to take  when to take this   iron polysaccharides 150 MG capsule Commonly known as: NIFEREX Take 150 mg by mouth 2 (two) times daily.   isosorbide mononitrate 60 MG 24 hr tablet Commonly known as: IMDUR Take 1 tablet (60 mg total) by mouth daily.    levothyroxine 50 MCG tablet Commonly known as: SYNTHROID Take 50 mcg by mouth daily at 6 (six) AM.   loperamide 2 MG capsule Commonly known as: IMODIUM Take 2 mg by mouth as needed for diarrhea or loose stools (max 8 doses in 24 hours).   loratadine 10 MG tablet Commonly known as: CLARITIN Take 10 mg by mouth daily.   magnesium hydroxide 400 MG/5ML suspension Commonly known as: MILK OF MAGNESIA Take 30 mLs by mouth at bedtime as needed for mild constipation.   meclizine 25 MG tablet Commonly known as: ANTIVERT Take 25 mg by mouth every 8 (eight) hours as needed for dizziness.   nystatin powder Commonly known as: MYCOSTATIN/NYSTOP Apply 1 application topically See admin instructions. Apply to rash around stoma on stoma care days, when replacing bag - on Mondays   ondansetron 4 MG tablet Commonly known as: ZOFRAN Take 1 tablet (4 mg total) by mouth every 6 (six) hours as needed for nausea or vomiting.   polyethylene glycol 17 g packet Commonly known as: MIRALAX / GLYCOLAX Take 17 g by mouth daily as needed (constipation). Mix with 8 ounces of fluid and drink  sodium chloride 0.65 % Soln nasal spray Commonly known as: OCEAN Place 1 spray into both nostrils every 12 (twelve) hours as needed for congestion.   Triple Antibiotic [phone] Oint Apply 1 application topically daily as needed (minor skin tears/abrasians).   vitamin C 250 MG tablet Commonly known as: ASCORBIC ACID Take 250 mg by mouth 2 (two) times daily.       Follow-up Information    Lovett Calender, PA-C. Schedule an appointment as soon as possible for a visit in 1 week(s).   Specialty: Physician Assistant Contact information: [address] [place] [address] [phone]              No Known Allergies  Consultations:  None   Procedures/Studies: CT Head Wo Contrast  Result Date: 03/13/2021 CLINICAL DATA:  Fall EXAM: CT HEAD WITHOUT CONTRAST TECHNIQUE: Contiguous axial  images were obtained from the base of the skull through the vertex without intravenous contrast. COMPARISON:  None. FINDINGS: Brain: There is no mass, hemorrhage or extra-axial collection. The size and configuration of the ventricles and extra-axial CSF spaces are normal. There is hypoattenuation of the white matter, most commonly indicating chronic small vessel disease. Vascular: No abnormal hyperdensity of the major intracranial arteries or dural venous sinuses. No intracranial atherosclerosis. Skull: The visualized skull base, calvarium and extracranial soft tissues are normal. Sinuses/Orbits: No fluid levels or advanced mucosal thickening of the visualized paranasal sinuses. No mastoid or middle ear effusion. The orbits are normal. IMPRESSION: Chronic small vessel disease without acute intracranial abnormality. Electronically Signed   By: Ulyses Jarred M.D.   On: 03/13/2021 20:11   CT Head Wo Contrast  Result Date: 02/27/2021 CLINICAL DATA:  Head trauma. Status post fall. Dizzy. Does have vertigo at baseline. EXAM: CT HEAD WITHOUT CONTRAST TECHNIQUE: Contiguous axial images were obtained from the base of the skull through the vertex without intravenous contrast. COMPARISON:  CT head 02/16/2017 FINDINGS: Brain: Cerebral ventricle sizes are concordant with the degree of cerebral volume loss. Patchy and confluent areas of decreased attenuation are noted throughout the deep and periventricular white matter of the cerebral hemispheres bilaterally, compatible with chronic microvascular ischemic disease. No evidence of large-territorial acute infarction. No parenchymal hemorrhage. No mass lesion. No extra-axial collection. No mass effect or midline shift. No hydrocephalus. Basilar cisterns are patent. Vascular: No hyperdense vessel. Atherosclerotic calcifications are present within the cavernous internal carotid and vertebral arteries. Skull: No acute fracture or focal lesion. Sinuses/Orbits: Bilateral frontal and  bilateral ethmoid mucosal thickening/polyp. Otherwise remaining paranasal sinuses and mastoid air cells are clear. The orbits are unremarkable. Other: None. IMPRESSION: No acute intracranial abnormality. Electronically Signed   By: Iven Finn M.D.   On: 02/27/2021 05:35   DG Knee Complete 4 Views Right  Result Date: 02/27/2021 CLINICAL DATA:  Fall.  Knee pain. EXAM: RIGHT KNEE - COMPLETE 4+ VIEW COMPARISON:  X-ray right knee 01/13/2002 report without imaging FINDINGS: Slight cortical irregularity of the proximal fibular shaft with no definite acute displaced fracture. No evidence of acute displaced fracture, dislocation, or joint effusion. Patellar enthesophyte formation. No evidence of severe arthropathy or aggressive appearing focal bone abnormality. Subcutaneus soft tissue edema. IMPRESSION: No definite acute displaced fracture or dislocation. Electronically Signed   By: Iven Finn M.D.   On: 02/27/2021 05:46       Subjective:  Patient seen and examined at the bedside this morning.  Medically stable for discharge.  I discussed the discharge planning with the son at the bedside.  Discharge Exam: Vitals:   03/15/21 0818 03/15/21 0837  BP:  (!) 155/76  Pulse:    Resp:    Temp:    SpO2: 96%    Vitals:   03/15/21 0500 03/15/21 0800 03/15/21 0818 03/15/21 0837  BP:    (!) 155/76  Pulse:      Resp:  20    Temp:      TempSrc:      SpO2:   96%   Weight: 94.3 kg       General: Pt is alert, awake, not in acute distress Cardiovascular: RRR, S1/S2 +, no rubs, no gallops Respiratory: CTA bilaterally, no wheezing, no rhonchi Abdominal: Soft, NT, ND, bowel sounds + Extremities:trace bilateral lower extremity edema, no cyanosis    The results of significant diagnostics from this hospitalization (including imaging, microbiology, ancillary and laboratory) are listed below for reference.     Microbiology: Recent Results (from the past 240 hour(s))  Resp Panel by RT-PCR (Flu  A&B, Covid) Nasopharyngeal Swab     Status: None   Collection Time: 03/13/21  6:03 PM   Specimen: Nasopharyngeal Swab; Nasopharyngeal(NP) swabs in vial transport medium  Result Value Ref Range Status   SARS Coronavirus 2 by RT PCR NEGATIVE NEGATIVE Final    Comment: (NOTE) SARS-CoV-2 target nucleic acids are NOT DETECTED.  The SARS-CoV-2 RNA is generally detectable in upper respiratory specimens during the acute phase of infection. The lowest concentration of SARS-CoV-2 viral copies this assay can detect is 138 copies/mL. A negative result does not preclude SARS-Cov-2 infection and should not be used as the sole basis for treatment or other patient management decisions. A negative result may occur with  improper specimen collection/handling, submission of specimen other than nasopharyngeal swab, presence of viral mutation(s) within the areas targeted by this assay, and inadequate number of viral copies(<138 copies/mL). A negative result must be combined with clinical observations, patient history, and epidemiological information. The expected result is Negative.  Fact Sheet for Patients:  EntrepreneurPulse.com.au  Fact Sheet for Healthcare Providers:  IncredibleEmployment.be  This test is no t yet approved or cleared by the [place] FDA and  has been authorized for detection and/or diagnosis of SARS-CoV-2 by FDA under an Emergency Use Authorization (EUA). This EUA will remain  in effect (meaning this test can be used) for the duration of the COVID-19 declaration under Section 564(b)(1) of the Act, 21 U.S.C.section 360bbb-3(b)(1), unless the authorization is terminated  or revoked sooner.       Influenza A by PCR NEGATIVE NEGATIVE Final   Influenza B by PCR NEGATIVE NEGATIVE Final    Comment: (NOTE) The Xpert Xpress SARS-CoV-2/FLU/RSV plus assay is intended as an aid in the diagnosis of influenza from Nasopharyngeal swab specimens  and should not be used as a sole basis for treatment. Nasal washings and aspirates are unacceptable for Xpert Xpress SARS-CoV-2/FLU/RSV testing.  Fact Sheet for Patients: EntrepreneurPulse.com.au  Fact Sheet for Healthcare Providers: IncredibleEmployment.be  This test is not yet approved or cleared by the [place] FDA and has been authorized for detection and/or diagnosis of SARS-CoV-2 by FDA under an Emergency Use Authorization (EUA). This EUA will remain in effect (meaning this test can be used) for the duration of the COVID-19 declaration under Section 564(b)(1) of the Act, 21 U.S.C. section 360bbb-3(b)(1), unless the authorization is terminated or revoked.  Performed at [facility], [place] [address], [place], [place] [address]      Labs: BNP (last 3 results) Recent Labs  05/10/20 0941 03/14/21 0500  BNP 309.6* 300.9*   Basic Metabolic Panel: Recent Labs  Lab 03/13/21 1712 03/14/21 0500 03/15/21 0510  NA 136 139 138  K 4.5 4.0 3.6  CL 100 104 101  CO2 27 28 25   GLUCOSE 97 97 96  BUN 41* 35* 21  CREATININE 1.91* 1.37* 1.14*  CALCIUM 9.1 9.4 9.7   Liver Function Tests: No results for input(s): AST, ALT, ALKPHOS, BILITOT, PROT, ALBUMIN in the last 168 hours. No results for input(s): LIPASE, AMYLASE in the last 168 hours. No results for input(s): AMMONIA in the last 168 hours. CBC: Recent Labs  Lab 03/13/21 1712 03/14/21 0500 03/15/21 0510  WBC 6.2 6.1 8.1  NEUTROABS 3.6  --  5.4  HGB 9.3* 9.5* 10.0*  HCT 31.0* 31.0* 32.7*  MCV 95.4 94.2 93.2  PLT 228 206 220   Cardiac Enzymes: No results for input(s): CKTOTAL, CKMB, CKMBINDEX, TROPONINI in the last 168 hours. BNP: Invalid input(s): POCBNP CBG: No results for input(s): GLUCAP in the last 168 hours. D-Dimer No results for input(s): DDIMER in the last 72 hours. Hgb A1c No results for input(s): HGBA1C in the last 72 hours. Lipid  Profile No results for input(s): CHOL, HDL, LDLCALC, TRIG, CHOLHDL, LDLDIRECT in the last 72 hours. Thyroid function studies No results for input(s): TSH, T4TOTAL, T3FREE, THYROIDAB in the last 72 hours.  Invalid input(s): FREET3 Anemia work up No results for input(s): VITAMINB12, FOLATE, FERRITIN, TIBC, IRON, RETICCTPCT in the last 72 hours. Urinalysis    Component Value Date/Time   COLORURINE YELLOW 03/13/2021 2105   APPEARANCEUR HAZY (A) 03/13/2021 2105   LABSPEC 1.015 03/13/2021 2105   PHURINE 5.0 03/13/2021 2105   GLUCOSEU NEGATIVE 03/13/2021 2105   HGBUR NEGATIVE 03/13/2021 2105   BILIRUBINUR NEGATIVE 03/13/2021 2105   KETONESUR NEGATIVE 03/13/2021 2105   PROTEINUR 100 (A) 03/13/2021 2105   UROBILINOGEN 0.2 07/23/2015 1610   NITRITE NEGATIVE 03/13/2021 2105   LEUKOCYTESUR NEGATIVE 03/13/2021 2105   Sepsis Labs Invalid input(s): PROCALCITONIN,  WBC,  LACTICIDVEN Microbiology Recent Results (from the past 240 hour(s))  Resp Panel by RT-PCR (Flu A&B, Covid) Nasopharyngeal Swab     Status: None   Collection Time: 03/13/21  6:03 PM   Specimen: Nasopharyngeal Swab; Nasopharyngeal(NP) swabs in vial transport medium  Result Value Ref Range Status   SARS Coronavirus 2 by RT PCR NEGATIVE NEGATIVE Final    Comment: (NOTE) SARS-CoV-2 target nucleic acids are NOT DETECTED.  The SARS-CoV-2 RNA is generally detectable in upper respiratory specimens during the acute phase of infection. The lowest concentration of SARS-CoV-2 viral copies this assay can detect is 138 copies/mL. A negative result does not preclude SARS-Cov-2 infection and should not be used as the sole basis for treatment or other patient management decisions. A negative result may occur with  improper specimen collection/handling, submission of specimen other than nasopharyngeal swab, presence of viral mutation(s) within the areas targeted by this assay, and inadequate number of viral copies(<138 copies/mL). A  negative result must be combined with clinical observations, patient history, and epidemiological information. The expected result is Negative.  Fact Sheet for Patients:  EntrepreneurPulse.com.au  Fact Sheet for Healthcare Providers:  IncredibleEmployment.be  This test is no t yet approved or cleared by the [place] FDA and  has been authorized for detection and/or diagnosis of SARS-CoV-2 by FDA under an Emergency Use Authorization (EUA). This EUA will remain  in effect (meaning this test can be used) for the duration of the COVID-19  declaration under Section 564(b)(1) of the Act, 21 U.S.C.section 360bbb-3(b)(1), unless the authorization is terminated  or revoked sooner.       Influenza A by PCR NEGATIVE NEGATIVE Final   Influenza B by PCR NEGATIVE NEGATIVE Final    Comment: (NOTE) The Xpert Xpress SARS-CoV-2/FLU/RSV plus assay is intended as an aid in the diagnosis of influenza from Nasopharyngeal swab specimens and should not be used as a sole basis for treatment. Nasal washings and aspirates are unacceptable for Xpert Xpress SARS-CoV-2/FLU/RSV testing.  Fact Sheet for Patients: EntrepreneurPulse.com.au  Fact Sheet for Healthcare Providers: IncredibleEmployment.be  This test is not yet approved or cleared by the [place] FDA and has been authorized for detection and/or diagnosis of SARS-CoV-2 by FDA under an Emergency Use Authorization (EUA). This EUA will remain in effect (meaning this test can be used) for the duration of the COVID-19 declaration under Section 564(b)(1) of the Act, 21 U.S.C. section 360bbb-3(b)(1), unless the authorization is terminated or revoked.  Performed at [facility], [place] [address], [place], [place] [address]     Please note: You were cared for by a hospitalist during your hospital stay. Once you are discharged, your primary care physician  will handle any further medical issues. Please note that NO REFILLS for any discharge medications will be authorized once you are discharged, as it is imperative that you return to your primary care physician (or establish a relationship with a primary care physician if you do not have one) for your post hospital discharge needs so that they can reassess your need for medications and monitor your lab values.    Time coordinating discharge: 40 minutes  SIGNED:   Shelly Coss, MD  [facility] 03/15/2021, 10:57 AM Pager [phone]  If 7PM-7AM, please contact night-coverage [email or web address] Password [email or web address]

## 2021-03-15 NOTE — Evaluation (Signed)
Occupational Therapy Evaluation Patient Details Name: [name] MRN: [id number] DOB: Mar 28, 1934 Today's Date: 03/15/2021    History of Present Illness Pt is a 86 y.o. female who presented to ED for evaluation of the dizziness.  Patient was experiencing vertigo that led to a fall on 02/27/2021.  She had hit her head, was having a headache, evaluated in the ED, had negative head CT, and return to her ALF.  Dizziness improved for a while before becoming worse again yesterday.  She does not have a headache at this time and denies any additional falls.  She denies any focal numbness or weakness.  She is normally able to ambulate unassisted but unable to do so today due to dizziness, described as though the room is spinning.  Symptoms resolve when she remains still and closes her eyes.  Pt admitted for vertigo, CHF, and AKI.  Pt with medical history significant for dementia NICM with ICD, CHF, COPD, hypothyroidism, colon cancer status post resection and colostomy, and vertigo,   Clinical Impression   Patient is currently requiring assistance with ADLs including minimal assist with toileting, LE dressing, and with bathing, and setup/supervision assist with grooming and UE dressing, some of which is below patient's typical baseline of being Modified independent with all ADLs except compression hose.  During this evaluation, patient was limited by generalized weakness and decreased activity tolerance with poor standing balance, which has the potential to impact patient's safety and independence during functional mobility, as well as performance for ADLs. [place] "6-clicks" Daily Activity Inpatient Short Form score of 19/24 indicates 42.80% ADL impairment this session. Patient lives at an ALF and would like to return there for in-house [facility] PT and OT which is recommended. Patient demonstrates good rehab potential, and should benefit from continued skilled occupational therapy services while in acute  care to maximize safety, independence and quality of life at home.   ?   Follow Up Recommendations  Home health [facility] bed    Recommendations for Other Services       Precautions / Restrictions Precautions Precautions: Fall Restrictions Weight Bearing Restrictions: No      Mobility Bed Mobility               General bed mobility comments: Pt in recliner for OT Evaluation.    Transfers Overall transfer level: Needs assistance Equipment used: Rolling walker (2 wheeled) Transfers: Sit to/from Stand Sit to Stand: Supervision;From elevated surface (Increased time/effort with pt initiating anterior scoot on recliner prior to stand.)         General transfer comment: Min-Mod as As to rise from low toilet.    Balance Overall balance assessment: Needs assistance Sitting-balance support: No upper extremity supported Sitting balance-Leahy Scale: Good     Standing balance support: Bilateral upper extremity supported Standing balance-Leahy Scale: Poor Standing balance comment: requiring RW and min Guard to supervision.  Pt does report that all dizziness has resolved.                           ADL either performed or assessed with clinical judgement   ADL Overall ADL's : Needs assistance/impaired Eating/Feeding: Modified independent   Grooming: Set up;Sitting;Wash/dry hands   Upper Body Bathing: Sitting;Set up   Lower Body Bathing: Min guard;Sitting/lateral leans;Sit to/from stand   Upper Body Dressing : Set up;Sitting   Lower Body Dressing: Minimal assistance Lower Body Dressing Details (indicate cue  type and reason): Pt reports that she is assisted for her compression hose at baseline. Pt able to demo doffing hospital socks.  Pt able to don hospital underwear (not her own, therefore pt reports more of a struggle) with Min As. Toilet Transfer: Minimal assistance;Regular Toilet;Grab bars Armed forces technical officer Details  (indicate cue type and reason): Pt reports that hospital commode is lower than her toilet at ALF. Pt able to descend to toilet with supervision, but required Min As to rise from toilet. Conversely, pt able to stand from recliner wtih supervision only. Toileting- Water quality scientist and Hygiene: Min guard;Sit to/from stand;Sitting/lateral lean       Functional mobility during ADLs: Minimal assistance;Rolling walker;Supervision/safety;Min guard       Vision Baseline Vision/History: Wears glasses Wears Glasses: At all times Patient Visual Report: No change from baseline       Perception     Praxis      Pertinent Vitals/Pain Pain Assessment: No/denies pain     Hand Dominance Right   Extremity/Trunk Assessment Upper Extremity Assessment Upper Extremity Assessment: Overall WFL for tasks assessed   Lower Extremity Assessment Lower Extremity Assessment:  (1+ edema to lower legs. Pt reports that they weight her daily at ALF)   Cervical / Trunk Assessment Cervical / Trunk Assessment: Kyphotic Cervical / Trunk Exceptions: forward head - limited neck extension   Communication Communication Communication: No difficulties   Cognition Arousal/Alertness: Awake/alert Behavior During Therapy: WFL for tasks assessed/performed Overall Cognitive Status: Within Functional Limits for tasks assessed                                     General Comments       Exercises     Shoulder Instructions      Home Living Family/patient expects to be discharged to:: Assisted living                             Home Equipment: Walker - 4 wheels;Grab bars - tub/shower;Grab bars - toilet;Shower seat          Prior Functioning/Environment Level of Independence: Independent with assistive device(s);Needs assistance  Gait / Transfers Assistance Needed: Pt ambulates with Rollator. 1 fall this past year in May from dizziness, however son also endorses the pt frequently  "sliding out of bed onto the floor" and they are seeking a hostapil bed to correct this issue. ADL's / Homemaking Assistance Needed: Pt denies any assistance with ADLs except for compression hose. Pt reports independence with colostomy care.            OT Problem List: Decreased strength;Increased edema;Decreased activity tolerance;Decreased safety awareness;Impaired balance (sitting and/or standing);Decreased knowledge of use of DME or AE      OT Treatment/Interventions: Self-care/ADL training;Therapeutic exercise;Therapeutic activities;Energy conservation;Patient/family education;DME and/or AE instruction;Balance training    OT Goals(Current goals can be found in the care plan section) Acute Rehab OT Goals Patient Stated Goal: Go home and not to rehab OT Goal Formulation: With patient/family Potential to Achieve Goals: Good ADL Goals Pt Will Perform Grooming: standing;with modified independence Pt Will Transfer to Toilet: with modified independence;ambulating;bedside commode Pt Will Perform Toileting - Clothing Manipulation and hygiene: with modified independence;sit to/from stand;sitting/lateral leans Pt/caregiver will Perform Home Exercise Program: Increased ROM;Increased strength;Both right and left upper extremity;With Supervision  OT Frequency: Min 2X/week   Barriers to D/C:    None  Co-evaluation              AM-PAC OT "6 Clicks" Daily Activity     Outcome Measure Help from another person eating meals?: None Help from another person taking care of personal grooming?: A Little Help from another person toileting, which includes using toliet, bedpan, or urinal?: A Little Help from another person bathing (including washing, rinsing, drying)?: A Little Help from another person to put on and taking off regular upper body clothing?: A Little Help from another person to put on and taking off regular lower body clothing?: A Little 6 Click Score: 19   End of Session  Equipment Utilized During Treatment: Gait belt;Rolling walker Nurse Communication:  (SW, Barbaraann Rondo re: d/c planning.  RN aware of OT in room.)  Activity Tolerance: Patient tolerated treatment well Patient left: in chair;with call bell/phone within reach;with chair alarm set;with family/visitor present  OT Visit Diagnosis: Unsteadiness on feet (R26.81);Muscle weakness (generalized) (M62.81);History of falling (Z91.81)                Time: 9622-2979 OT Time Calculation (min): 34 min Charges:  OT General Charges $OT Visit: 1 Visit OT Evaluation $OT Eval Low Complexity: 1 Low OT Treatments $Self Care/Home Management : 8-22 mins  Anderson Malta, OT Acute Rehab Services Office: [phone] 03/15/2021  Julien Girt 03/15/2021, 11:00 AM

## 2021-03-15 NOTE — Plan of Care (Signed)
  Problem: Education: Goal: Knowledge of disease and its progression will improve Outcome: Adequate for Discharge Goal: Individualized Educational Video(s) Outcome: Adequate for Discharge   Problem: Fluid Volume: Goal: Compliance with measures to maintain balanced fluid volume will improve Outcome: Adequate for Discharge   Problem: Health Behavior/Discharge Planning: Goal: Ability to manage health-related needs will improve Outcome: Adequate for Discharge   Problem: Nutritional: Goal: Ability to make healthy dietary choices will improve Outcome: Adequate for Discharge   Problem: Clinical Measurements: Goal: Complications related to the disease process, condition or treatment will be avoided or minimized Outcome: Adequate for Discharge   Problem: Education: Goal: Ability to demonstrate management of disease process will improve Outcome: Adequate for Discharge Goal: Ability to verbalize understanding of medication therapies will improve Outcome: Adequate for Discharge Goal: Individualized Educational Video(s) Outcome: Adequate for Discharge   Problem: Activity: Goal: Capacity to carry out activities will improve Outcome: Adequate for Discharge   Problem: Cardiac: Goal: Ability to achieve and maintain adequate cardiopulmonary perfusion will improve Outcome: Adequate for Discharge

## 2021-03-21 ENCOUNTER — Ambulatory Visit (INDEPENDENT_AMBULATORY_CARE_PROVIDER_SITE_OTHER): Payer: Medicare HMO

## 2021-03-21 DIAGNOSIS — I428 Other cardiomyopathies: Secondary | ICD-10-CM | POA: Diagnosis not present

## 2021-03-22 LAB — CUP PACEART REMOTE DEVICE CHECK
Battery Remaining Longevity: 6 mo
Battery Voltage: 2.82 V
Brady Statistic AP VP Percent: 0.04 %
Brady Statistic AP VS Percent: 0 %
Brady Statistic AS VP Percent: 99.94 %
Brady Statistic AS VS Percent: 0.01 %
Brady Statistic RA Percent Paced: 0.04 %
Brady Statistic RV Percent Paced: 99.99 %
Date Time Interrogation Session: 20220613022824
HighPow Impedance: 51 Ohm
Implantable Lead Implant Date: 20131216
Implantable Lead Implant Date: 20131216
Implantable Lead Location: 753859
Implantable Lead Location: 753860
Implantable Lead Model: 5076
Implantable Pulse Generator Implant Date: 20131216
Lead Channel Impedance Value: 266 Ohm
Lead Channel Impedance Value: 323 Ohm
Lead Channel Impedance Value: 380 Ohm
Lead Channel Pacing Threshold Amplitude: 0.625 V
Lead Channel Pacing Threshold Amplitude: 0.625 V
Lead Channel Pacing Threshold Pulse Width: 0.4 ms
Lead Channel Pacing Threshold Pulse Width: 0.4 ms
Lead Channel Sensing Intrinsic Amplitude: 1.375 mV
Lead Channel Sensing Intrinsic Amplitude: 1.375 mV
Lead Channel Sensing Intrinsic Amplitude: 4.375 mV
Lead Channel Sensing Intrinsic Amplitude: 5.125 mV
Lead Channel Setting Pacing Amplitude: 1.5 V
Lead Channel Setting Pacing Amplitude: 2 V
Lead Channel Setting Pacing Pulse Width: 0.4 ms
Lead Channel Setting Sensing Sensitivity: 0.3 mV

## 2021-04-12 NOTE — Addendum Note (Signed)
Addended by: Cheri Kearns A on: 04/12/2021 08:27 AM   Modules accepted: Level of Service

## 2021-04-12 NOTE — Progress Notes (Signed)
Remote ICD transmission.   

## 2021-04-14 ENCOUNTER — Encounter: Payer: Medicare HMO | Admitting: Student

## 2021-04-25 ENCOUNTER — Ambulatory Visit (INDEPENDENT_AMBULATORY_CARE_PROVIDER_SITE_OTHER): Payer: Medicare HMO

## 2021-04-25 DIAGNOSIS — I428 Other cardiomyopathies: Secondary | ICD-10-CM

## 2021-04-26 LAB — CUP PACEART REMOTE DEVICE CHECK
Battery Remaining Longevity: 4 mo
Battery Voltage: 2.81 V
Brady Statistic AP VP Percent: 0.03 %
Brady Statistic AP VS Percent: 0 %
Brady Statistic AS VP Percent: 99.96 %
Brady Statistic AS VS Percent: 0.01 %
Brady Statistic RA Percent Paced: 0.03 %
Brady Statistic RV Percent Paced: 99.99 %
Date Time Interrogation Session: 20220718033625
HighPow Impedance: 51 Ohm
Implantable Lead Implant Date: 20131216
Implantable Lead Implant Date: 20131216
Implantable Lead Location: 753859
Implantable Lead Location: 753860
Implantable Lead Model: 5076
Implantable Pulse Generator Implant Date: 20131216
Lead Channel Impedance Value: 266 Ohm
Lead Channel Impedance Value: 323 Ohm
Lead Channel Impedance Value: 342 Ohm
Lead Channel Pacing Threshold Amplitude: 0.625 V
Lead Channel Pacing Threshold Amplitude: 0.625 V
Lead Channel Pacing Threshold Pulse Width: 0.4 ms
Lead Channel Pacing Threshold Pulse Width: 0.4 ms
Lead Channel Sensing Intrinsic Amplitude: 1.125 mV
Lead Channel Sensing Intrinsic Amplitude: 1.125 mV
Lead Channel Sensing Intrinsic Amplitude: 4.375 mV
Lead Channel Sensing Intrinsic Amplitude: 5.125 mV
Lead Channel Setting Pacing Amplitude: 1.5 V
Lead Channel Setting Pacing Amplitude: 2 V
Lead Channel Setting Pacing Pulse Width: 0.4 ms
Lead Channel Setting Sensing Sensitivity: 0.3 mV

## 2021-05-02 NOTE — Progress Notes (Signed)
Electrophysiology Office Note Date: 05/03/2021  ID:  Shelby, [name] 1934/01/10, MRN [id number]  PCP: Lovett Calender, PA-C Primary Cardiologist: Minus Breeding, MD Electrophysiologist: Thompson Grayer, MD   CC: Routine ICD follow-up  [name] is a 87 y.o. female seen today for Thompson Grayer, MD for routine electrophysiology followup.  Since last being seen in our clinic the patient reports doing well overall.  she denies chest pain, palpitations, dyspnea, PND, orthopnea, nausea, vomiting, dizziness, syncope, edema, weight gain, or early satiety. She has not had ICD shocks.   Device History: Medtronic Dual Chamber ICD implanted 09/2012 for NICM.  Past Medical History:  Diagnosis Date   Arthritis    "comes and goes" (03/01/2015)   Asthma    CHF (congestive heart failure) (HCC)    Colon cancer (HCC)    Colostomy care ([place])    Complete heart block ([place])    COPD (chronic obstructive pulmonary disease) ([place])    Diabetes mellitus without complication ([place])    pt denies this hx on 03/01/2015   HCAP (healthcare-associated pneumonia) 04/22/2017   History of blood transfusion 03/01/2015; 04/23/2017   "related to anemia,"   Hyperlipidemia    Hypertension    Hypothyroidism    Pulmonary embolism [facility])    Rectal cancer ypTypN0 (0/13) s/p chemoXRT/ APR/colostomy 2006 07/27/2015   [facility]  [place], [place] [address]  [phone]   REPORT OF SURGICAL PATHOLOGY   Case #: [id number]  Patient Name: [name]  PID: [id number]  Pathologist: Janae Bridgeman L. [place], MD  DOB/Age May 17, 1934 (Age: 74) Gender: F  Date Taken: 11/03/2004  Date Received: 11/03/2004   FINAL DIAGNOSIS   MICROSCOPIC EXAMINATION AND DIAGNOSIS   1. EXCISION, PERIRECTAL TISS   Past Surgical History:  Procedure Laterality Date   ABDOMINAL HYSTERECTOMY     ABDOMINOPERINEAL PROCTOCOLECTOMY  2006   Neoadj chemoXRT & open APR Dr Dalbert Batman for low rectal cancer   ANKLE FRACTURE SURGERY Left     CARDIAC CATHETERIZATION N/A 03/22/2015   Procedure: Right/Left Heart Cath and Coronary Angiography;  Surgeon: Leonie Man, MD;  Location: [facility];  Service: Cardiovascular;  Laterality: N/A;   CATARACT EXTRACTION W/ INTRAOCULAR LENS  IMPLANT, BILATERAL Bilateral    CHOLECYSTECTOMY     COLOSTOMY  2006   permananet end colostomy after APR resection of low rectal cancer.  Dr Dalbert Batman   DILATION AND CURETTAGE OF UTERUS     FRACTURE SURGERY     ICD IMPLANT  09/23/2012   MDT, implanted for primary prevention by Dr Deno Etienne in [place]  2007   SBO.  Dr Lasandra Beech HERNIA REPAIR  2007   lysis of adhesions   TUBAL LIGATION      Current Outpatient Medications  Medication Sig Dispense Refill   acetaminophen (TYLENOL) 500 MG tablet Take 500-1,000 mg by mouth See admin instructions. Take 2 tablets (1000 mg) by mouth every morning, may also take 1 tablet (500  mg) every 6 hours as needed for fever 99.5-101 F, minor headache, minor discomfort     alum & mag hydroxide-simeth (MAALOX/MYLANTA) 200-200-20 MG/5ML suspension Take 30 mLs by mouth every 6 (six) hours as needed for heartburn or indigestion.     aspirin EC 81 MG tablet Take 1 tablet (81 mg total) by mouth every morning.     atorvastatin (LIPITOR) 20 MG tablet Take 20 mg by mouth at bedtime.  carboxymethylcellulose 1 % ophthalmic solution Place 1 drop into both eyes 3 (three) times daily.      carvedilol (COREG) 6.25 MG tablet Take 6.25 mg by mouth 2 (two) times daily with a meal.      cyclobenzaprine (FLEXERIL) 5 MG tablet Take 5 mg by mouth at bedtime.     famotidine (PEPCID) 20 MG tablet Take 1 tablet (20 mg total) by mouth daily. 30 tablet 1   fluticasone-salmeterol (ADVAIR) 500-50 MCG/ACT AEPB Inhale 1 puff into the lungs in the morning and at bedtime.     furosemide (LASIX) 40 MG tablet Take 40 mg by mouth daily.     guaifenesin (ROBITUSSIN) 100 MG/5ML syrup Take 200 mg by mouth every 6 (six)  hours as needed for cough.     hydrALAZINE (APRESOLINE) 50 MG tablet Take 1 tablet (50 mg total) by mouth 3 (three) times daily.     iron polysaccharides (NIFEREX) 150 MG capsule Take 150 mg by mouth 2 (two) times daily.     isosorbide mononitrate (IMDUR) 60 MG 24 hr tablet Take 1 tablet (60 mg total) by mouth daily. 30 tablet 1   levothyroxine (SYNTHROID, LEVOTHROID) 50 MCG tablet Take 50 mcg by mouth daily at 6 (six) AM.  2   loperamide (IMODIUM) 2 MG capsule Take 2 mg by mouth as needed for diarrhea or loose stools (max 8 doses in 24 hours).     loratadine (CLARITIN) 10 MG tablet Take 10 mg by mouth daily.     magnesium hydroxide (MILK OF MAGNESIA) 400 MG/5ML suspension Take 30 mLs by mouth at bedtime as needed for mild constipation.     meclizine (ANTIVERT) 25 MG tablet Take 25 mg by mouth every 8 (eight) hours as needed for dizziness.     Menthol-Methyl Salicylate (BENGAY ARTHRITIS FORMULA EX) Apply 1 application topically 2 (two) times daily as needed (knee pain).     Neomycin-Bacitracin-Polymyxin (TRIPLE ANTIBIOTIC) [phone] OINT Apply 1 application topically daily as needed (minor skin tears/abrasians).     nystatin (MYCOSTATIN/NYSTOP) powder Apply 1 application topically See admin instructions. Apply to rash around stoma on stoma care days, when replacing bag - on Mondays     ondansetron (ZOFRAN) 4 MG tablet Take 1 tablet (4 mg total) by mouth every 6 (six) hours as needed for nausea or vomiting. 12 tablet 0   polyethylene glycol (MIRALAX / GLYCOLAX) packet Take 17 g by mouth daily as needed (constipation). Mix with 8 ounces of fluid and drink     sodium chloride (OCEAN) 0.65 % SOLN nasal spray Place 1 spray into both nostrils every 12 (twelve) hours as needed for congestion.     vitamin C (ASCORBIC ACID) 250 MG tablet Take 250 mg by mouth 2 (two) times daily.     No current facility-administered medications for this visit.    Allergies:   Patient has no known allergies.   Social  History: Social History   Socioeconomic History   Marital status: Divorced    Spouse name: Not on file   Number of children: Not on file   Years of education: Not on file   Highest education level: Not on file  Occupational History   Occupation: Retired Psychologist, counselling  Tobacco Use   Smoking status: Never   Smokeless tobacco: Never  Vaping Use   Vaping Use: Never used  Substance and Sexual Activity   Alcohol use: No   Drug use: No   Sexual activity: Not Currently  Other Topics Concern  Not on file  Social History Narrative   12/19 was a resident of assisted living.  Three sons.  Divorced.   Social Determinants of Health   Financial Resource Strain: Not on file  Food Insecurity: Not on file  Transportation Needs: Not on file  Physical Activity: Not on file  Stress: Not on file  Social Connections: Not on file  Intimate Partner Violence: Not on file    Family History: Family History  Problem Relation Age of Onset   CAD Other    Hypertension Other    Stroke Other    Heart disease Mother        No details   Colon cancer Neg Hx    GI Bleed Neg Hx     Review of Systems: All other systems reviewed and are otherwise negative except as noted above.   Physical Exam: Vitals:   05/03/21 1031  BP: (!) 150/72  Pulse: 80  SpO2: 98%  Weight: 204 lb 12.8 oz (92.9 kg)  Height: '5\' 6"'$  (1.676 m)     GEN- The patient is well appearing, alert and oriented x 3 today.   HEENT: normocephalic, atraumatic; sclera clear, conjunctiva pink; hearing intact; oropharynx clear; neck supple, no JVP Lymph- no cervical lymphadenopathy Lungs- Clear to ausculation bilaterally, normal work of breathing.  No wheezes, rales, rhonchi Heart- Regular rate and rhythm, no murmurs, rubs or gallops, PMI not laterally displaced GI- soft, non-tender, non-distended, bowel sounds present, no hepatosplenomegaly Extremities- no clubbing or cyanosis. No edema; DP/PT/radial pulses 2+ bilaterally MS- no  significant deformity or atrophy Skin- warm and dry, no rash or lesion; ICD pocket well healed Psych- euthymic mood, full affect Neuro- strength and sensation are intact  ICD interrogation- reviewed in detail today,  See PACEART report  EKG:  EKG is ordered today. The ekg ordered today shows V pacing at 80 bpm  Recent Labs: 03/14/2021: B Natriuretic Peptide 654.2 03/15/2021: BUN 21; Creatinine, Ser 1.14; Hemoglobin 10.0; Platelets 220; Potassium 3.6; Sodium 138   Wt Readings from Last 3 Encounters:  05/03/21 204 lb 12.8 oz (92.9 kg)  03/15/21 207 lb 14.3 oz (94.3 kg)  02/27/21 205 lb (93 kg)     Other studies Reviewed: Additional studies/ records that were reviewed today include: Previous EP office notes.   Assessment and Plan:  1.  Chronic systolic dysfunction s/p Medtronic dual chamber ICD  Echo 01/2020 LVEF 40-45%. Update Echo.  euvolemic today Stable on an appropriate medical regimen Normal ICD function See Pace Art report No changes today She is device dependent. Given advanced age and fragility, not felt to be CRT candidate.  She has never had ICD therapies. Update Echo prior to gen change discussion. With advanced age and frailty, may be reasonable to consider down-grade to pacer if device is compatible vs just turning off therapies after ICD gen change.  She had not really thought about this. I encouraged she bring a family member with her to next appointment to discuss.  2. CHB Stable device function.   3. HTN Stable on current regimen  Current medicines are reviewed at length with the patient today.   The patient does not have concerns regarding her medicines.  The following changes were made today:  none  Labs/ tests ordered today include:  Orders Placed This Encounter  Procedures   EKG 12-Lead   ECHOCARDIOGRAM COMPLETE    Disposition:   Follow up with Dr. Rayann Heman  6 months to discuss and time gen change.  Jacalyn Lefevre, PA-C   05/03/2021 11:28 AM  [facility] [address] [place] [address] [phone] (office) [phone] (fax)

## 2021-05-03 ENCOUNTER — Ambulatory Visit (INDEPENDENT_AMBULATORY_CARE_PROVIDER_SITE_OTHER): Payer: Medicare HMO | Admitting: Student

## 2021-05-03 ENCOUNTER — Other Ambulatory Visit: Payer: Self-pay

## 2021-05-03 ENCOUNTER — Encounter: Payer: Self-pay | Admitting: Student

## 2021-05-03 VITALS — BP 150/72 | HR 80 | Ht 66.0 in | Wt 204.8 lb

## 2021-05-03 DIAGNOSIS — Z95810 Presence of automatic (implantable) cardiac defibrillator: Secondary | ICD-10-CM | POA: Diagnosis not present

## 2021-05-03 DIAGNOSIS — I5042 Chronic combined systolic (congestive) and diastolic (congestive) heart failure: Secondary | ICD-10-CM

## 2021-05-03 DIAGNOSIS — I428 Other cardiomyopathies: Secondary | ICD-10-CM | POA: Diagnosis not present

## 2021-05-03 DIAGNOSIS — I10 Essential (primary) hypertension: Secondary | ICD-10-CM | POA: Diagnosis not present

## 2021-05-03 DIAGNOSIS — I442 Atrioventricular block, complete: Secondary | ICD-10-CM

## 2021-05-03 LAB — CUP PACEART INCLINIC DEVICE CHECK
Battery Remaining Longevity: 4 mo
Battery Voltage: 2.8 V
Brady Statistic AP VP Percent: 0.03 %
Brady Statistic AP VS Percent: 0 %
Brady Statistic AS VP Percent: 99.86 %
Brady Statistic AS VS Percent: 0.11 %
Brady Statistic RA Percent Paced: 0.03 %
Brady Statistic RV Percent Paced: 99.89 %
Date Time Interrogation Session: 20220726112635
HighPow Impedance: 52 Ohm
Implantable Lead Implant Date: 20131216
Implantable Lead Implant Date: 20131216
Implantable Lead Location: 753859
Implantable Lead Location: 753860
Implantable Lead Model: 5076
Implantable Pulse Generator Implant Date: 20131216
Lead Channel Impedance Value: 304 Ohm
Lead Channel Impedance Value: 342 Ohm
Lead Channel Impedance Value: 380 Ohm
Lead Channel Pacing Threshold Amplitude: 0.625 V
Lead Channel Pacing Threshold Amplitude: 0.625 V
Lead Channel Pacing Threshold Pulse Width: 0.4 ms
Lead Channel Pacing Threshold Pulse Width: 0.4 ms
Lead Channel Sensing Intrinsic Amplitude: 1.25 mV
Lead Channel Sensing Intrinsic Amplitude: 1.375 mV
Lead Channel Sensing Intrinsic Amplitude: 2.625 mV
Lead Channel Sensing Intrinsic Amplitude: 4.375 mV
Lead Channel Setting Pacing Amplitude: 1.5 V
Lead Channel Setting Pacing Amplitude: 2 V
Lead Channel Setting Pacing Pulse Width: 0.4 ms
Lead Channel Setting Sensing Sensitivity: 0.3 mV

## 2021-05-03 NOTE — Patient Instructions (Signed)
Medication Instructions:  Your physician recommends that you continue on your current medications as directed. Please refer to the Current Medication list given to you today.  *If you need a refill on your cardiac medications before your next appointment, please call your pharmacy*   Lab Work: None If you have labs (blood work) drawn today and your tests are completely normal, you will receive your results only by: [place] (if you have MyChart) OR A paper copy in the mail If you have any lab test that is abnormal or we need to change your treatment, we will call you to review the results.   Testing/Procedures: Your physician has requested that you have an echocardiogram. Echocardiography is a painless test that uses sound waves to create images of your heart. It provides your doctor with information about the size and shape of your heart and how well your heart's chambers and valves are working. This procedure takes approximately one hour. There are no restrictions for this procedure.   Follow-Up: At [facility], you and your health needs are our priority.  As part of our continuing mission to provide you with exceptional heart care, we have created designated Provider Care Teams.  These Care Teams include your primary Cardiologist (physician) and Advanced Practice Providers (APPs -  Physician Assistants and Nurse Practitioners) who all work together to provide you with the care you need, when you need it.  We recommend signing up for the patient portal called "MyChart".  Sign up information is provided on this After Visit Summary.  MyChart is used to connect with patients for Virtual Visits (Telemedicine).  Patients are able to view lab/test results, encounter notes, upcoming appointments, etc.  Non-urgent messages can be sent to your provider as well.   To learn more about what you can do with MyChart, go to [email or web address].    Your next appointment:   As scheduled

## 2021-05-18 NOTE — Addendum Note (Signed)
Addended by: Cheri Kearns A on: 05/18/2021 12:59 PM   Modules accepted: Level of Service

## 2021-05-18 NOTE — Progress Notes (Signed)
Remote ICD transmission.   

## 2021-05-30 ENCOUNTER — Ambulatory Visit (INDEPENDENT_AMBULATORY_CARE_PROVIDER_SITE_OTHER): Payer: Medicare HMO

## 2021-05-30 DIAGNOSIS — I428 Other cardiomyopathies: Secondary | ICD-10-CM

## 2021-05-30 LAB — CUP PACEART REMOTE DEVICE CHECK
Battery Remaining Longevity: 4 mo
Battery Voltage: 2.8 V
Brady Statistic AP VP Percent: 0.06 %
Brady Statistic AP VS Percent: 0 %
Brady Statistic AS VP Percent: 99.9 %
Brady Statistic AS VS Percent: 0.04 %
Brady Statistic RA Percent Paced: 0.06 %
Brady Statistic RV Percent Paced: 99.96 %
Date Time Interrogation Session: 20220822042505
HighPow Impedance: 47 Ohm
Implantable Lead Implant Date: 20131216
Implantable Lead Implant Date: 20131216
Implantable Lead Location: 753859
Implantable Lead Location: 753860
Implantable Lead Model: 5076
Implantable Pulse Generator Implant Date: 20131216
Lead Channel Impedance Value: 266 Ohm
Lead Channel Impedance Value: 342 Ohm
Lead Channel Impedance Value: 380 Ohm
Lead Channel Pacing Threshold Amplitude: 0.625 V
Lead Channel Pacing Threshold Amplitude: 0.75 V
Lead Channel Pacing Threshold Pulse Width: 0.4 ms
Lead Channel Pacing Threshold Pulse Width: 0.4 ms
Lead Channel Sensing Intrinsic Amplitude: 1.25 mV
Lead Channel Sensing Intrinsic Amplitude: 1.25 mV
Lead Channel Sensing Intrinsic Amplitude: 2.625 mV
Lead Channel Sensing Intrinsic Amplitude: 4.375 mV
Lead Channel Setting Pacing Amplitude: 1.5 V
Lead Channel Setting Pacing Amplitude: 2 V
Lead Channel Setting Pacing Pulse Width: 0.4 ms
Lead Channel Setting Sensing Sensitivity: 0.3 mV

## 2021-06-15 NOTE — Progress Notes (Signed)
Remote ICD transmission.   

## 2021-07-04 ENCOUNTER — Ambulatory Visit (INDEPENDENT_AMBULATORY_CARE_PROVIDER_SITE_OTHER): Payer: Medicare HMO

## 2021-07-04 DIAGNOSIS — I428 Other cardiomyopathies: Secondary | ICD-10-CM

## 2021-07-04 LAB — CUP PACEART REMOTE DEVICE CHECK
Battery Remaining Longevity: 3 mo
Battery Voltage: 2.79 V
Brady Statistic AP VP Percent: 0.03 %
Brady Statistic AP VS Percent: 0 %
Brady Statistic AS VP Percent: 99.95 %
Brady Statistic AS VS Percent: 0.02 %
Brady Statistic RA Percent Paced: 0.03 %
Brady Statistic RV Percent Paced: 99.98 %
Date Time Interrogation Session: 20220926031707
HighPow Impedance: 49 Ohm
Implantable Lead Implant Date: 20131216
Implantable Lead Implant Date: 20131216
Implantable Lead Location: 753859
Implantable Lead Location: 753860
Implantable Lead Model: 5076
Implantable Pulse Generator Implant Date: 20131216
Lead Channel Impedance Value: 266 Ohm
Lead Channel Impedance Value: 323 Ohm
Lead Channel Impedance Value: 323 Ohm
Lead Channel Pacing Threshold Amplitude: 0.625 V
Lead Channel Pacing Threshold Amplitude: 0.75 V
Lead Channel Pacing Threshold Pulse Width: 0.4 ms
Lead Channel Pacing Threshold Pulse Width: 0.4 ms
Lead Channel Sensing Intrinsic Amplitude: 0.875 mV
Lead Channel Sensing Intrinsic Amplitude: 0.875 mV
Lead Channel Sensing Intrinsic Amplitude: 2.625 mV
Lead Channel Sensing Intrinsic Amplitude: 4.375 mV
Lead Channel Setting Pacing Amplitude: 1.5 V
Lead Channel Setting Pacing Amplitude: 2 V
Lead Channel Setting Pacing Pulse Width: 0.4 ms
Lead Channel Setting Sensing Sensitivity: 0.3 mV

## 2021-07-11 NOTE — Progress Notes (Signed)
Remote ICD transmission.   

## 2021-08-03 ENCOUNTER — Encounter (HOSPITAL_COMMUNITY): Payer: Self-pay | Admitting: Student

## 2021-08-03 ENCOUNTER — Ambulatory Visit (HOSPITAL_COMMUNITY): Payer: Medicare HMO | Attending: Cardiology

## 2021-08-08 ENCOUNTER — Ambulatory Visit (INDEPENDENT_AMBULATORY_CARE_PROVIDER_SITE_OTHER): Payer: Medicare HMO

## 2021-08-08 DIAGNOSIS — I428 Other cardiomyopathies: Secondary | ICD-10-CM

## 2021-08-08 LAB — CUP PACEART REMOTE DEVICE CHECK
Battery Remaining Longevity: 1 mo
Battery Voltage: 2.77 V
Brady Statistic AP VP Percent: 0.13 %
Brady Statistic AP VS Percent: 0 %
Brady Statistic AS VP Percent: 99.85 %
Brady Statistic AS VS Percent: 0.01 %
Brady Statistic RA Percent Paced: 0.13 %
Brady Statistic RV Percent Paced: 99.99 %
Date Time Interrogation Session: 20221031044225
HighPow Impedance: 47 Ohm
Implantable Lead Implant Date: 20131216
Implantable Lead Implant Date: 20131216
Implantable Lead Location: 753859
Implantable Lead Location: 753860
Implantable Lead Model: 5076
Implantable Pulse Generator Implant Date: 20131216
Lead Channel Impedance Value: 266 Ohm
Lead Channel Impedance Value: 323 Ohm
Lead Channel Impedance Value: 342 Ohm
Lead Channel Pacing Threshold Amplitude: 0.625 V
Lead Channel Pacing Threshold Amplitude: 0.625 V
Lead Channel Pacing Threshold Pulse Width: 0.4 ms
Lead Channel Pacing Threshold Pulse Width: 0.4 ms
Lead Channel Sensing Intrinsic Amplitude: 0.75 mV
Lead Channel Sensing Intrinsic Amplitude: 0.75 mV
Lead Channel Sensing Intrinsic Amplitude: 2.625 mV
Lead Channel Sensing Intrinsic Amplitude: 4.375 mV
Lead Channel Setting Pacing Amplitude: 1.5 V
Lead Channel Setting Pacing Amplitude: 2 V
Lead Channel Setting Pacing Pulse Width: 0.4 ms
Lead Channel Setting Sensing Sensitivity: 0.3 mV

## 2021-08-11 ENCOUNTER — Ambulatory Visit (HOSPITAL_COMMUNITY): Payer: Medicare HMO | Admitting: Hematology

## 2021-08-12 NOTE — Progress Notes (Deleted)
NO SHOW

## 2021-08-15 ENCOUNTER — Inpatient Hospital Stay (HOSPITAL_COMMUNITY): Payer: Medicare HMO | Attending: Hematology | Admitting: Hematology

## 2021-08-16 NOTE — Progress Notes (Signed)
Remote ICD transmission.   

## 2021-09-12 ENCOUNTER — Ambulatory Visit (INDEPENDENT_AMBULATORY_CARE_PROVIDER_SITE_OTHER): Payer: Medicare HMO

## 2021-09-12 DIAGNOSIS — I428 Other cardiomyopathies: Secondary | ICD-10-CM

## 2021-09-12 LAB — CUP PACEART REMOTE DEVICE CHECK
Battery Remaining Longevity: 1 mo
Battery Voltage: 2.75 V
Brady Statistic AP VP Percent: 0.12 %
Brady Statistic AP VS Percent: 0 %
Brady Statistic AS VP Percent: 99.83 %
Brady Statistic AS VS Percent: 0.05 %
Brady Statistic RA Percent Paced: 0.12 %
Brady Statistic RV Percent Paced: 99.95 %
Date Time Interrogation Session: 20221205082704
HighPow Impedance: 48 Ohm
Implantable Lead Implant Date: 20131216
Implantable Lead Implant Date: 20131216
Implantable Lead Location: 753859
Implantable Lead Location: 753860
Implantable Lead Model: 5076
Implantable Pulse Generator Implant Date: 20131216
Lead Channel Impedance Value: 266 Ohm
Lead Channel Impedance Value: 323 Ohm
Lead Channel Impedance Value: 342 Ohm
Lead Channel Pacing Threshold Amplitude: 0.625 V
Lead Channel Pacing Threshold Amplitude: 0.75 V
Lead Channel Pacing Threshold Pulse Width: 0.4 ms
Lead Channel Pacing Threshold Pulse Width: 0.4 ms
Lead Channel Sensing Intrinsic Amplitude: 0.875 mV
Lead Channel Sensing Intrinsic Amplitude: 0.875 mV
Lead Channel Sensing Intrinsic Amplitude: 2.625 mV
Lead Channel Sensing Intrinsic Amplitude: 4.375 mV
Lead Channel Setting Pacing Amplitude: 1.5 V
Lead Channel Setting Pacing Amplitude: 2 V
Lead Channel Setting Pacing Pulse Width: 0.4 ms
Lead Channel Setting Sensing Sensitivity: 0.3 mV

## 2021-09-21 NOTE — Progress Notes (Signed)
Remote ICD transmission.   

## 2021-10-04 ENCOUNTER — Encounter: Payer: Medicare HMO | Admitting: Internal Medicine

## 2021-10-13 ENCOUNTER — Telehealth: Payer: Self-pay

## 2021-10-13 NOTE — Telephone Encounter (Signed)
LVM on cell phone provided under patients name, home phone number listed as a facility unable to leave VM, left VM on patiens son cell phone direct number to device clinic given.

## 2021-10-13 NOTE — Telephone Encounter (Signed)
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 The nursing facility has closed down now. She do not have any access to the patient anymore.

## 2021-10-17 ENCOUNTER — Ambulatory Visit (INDEPENDENT_AMBULATORY_CARE_PROVIDER_SITE_OTHER): Payer: Medicare HMO

## 2021-10-17 ENCOUNTER — Telehealth: Payer: Self-pay

## 2021-10-17 DIAGNOSIS — I428 Other cardiomyopathies: Secondary | ICD-10-CM

## 2021-10-17 LAB — CUP PACEART REMOTE DEVICE CHECK
Battery Remaining Longevity: 1 mo
Battery Voltage: 2.7 V
Brady Statistic AP VP Percent: 0.02 %
Brady Statistic AP VS Percent: 0 %
Brady Statistic AS VP Percent: 99.96 %
Brady Statistic AS VS Percent: 0.01 %
Brady Statistic RA Percent Paced: 0.02 %
Brady Statistic RV Percent Paced: 99.99 %
Date Time Interrogation Session: 20230109072305
HighPow Impedance: 52 Ohm
Implantable Lead Implant Date: 20131216
Implantable Lead Implant Date: 20131216
Implantable Lead Location: 753859
Implantable Lead Location: 753860
Implantable Lead Model: 5076
Implantable Pulse Generator Implant Date: 20131216
Lead Channel Impedance Value: 266 Ohm
Lead Channel Impedance Value: 342 Ohm
Lead Channel Impedance Value: 342 Ohm
Lead Channel Pacing Threshold Amplitude: 0.625 V
Lead Channel Pacing Threshold Amplitude: 0.75 V
Lead Channel Pacing Threshold Pulse Width: 0.4 ms
Lead Channel Pacing Threshold Pulse Width: 0.4 ms
Lead Channel Sensing Intrinsic Amplitude: 1.125 mV
Lead Channel Sensing Intrinsic Amplitude: 1.125 mV
Lead Channel Sensing Intrinsic Amplitude: 2.625 mV
Lead Channel Sensing Intrinsic Amplitude: 4.375 mV
Lead Channel Setting Pacing Amplitude: 1.5 V
Lead Channel Setting Pacing Amplitude: 2 V
Lead Channel Setting Pacing Pulse Width: 0.4 ms
Lead Channel Setting Sensing Sensitivity: 0.3 mV

## 2021-10-17 NOTE — Telephone Encounter (Signed)
error 

## 2021-10-17 NOTE — Telephone Encounter (Signed)
I left a message on the son phone to give me a call back.  I left the device clinic number for him to call.

## 2021-10-18 NOTE — Telephone Encounter (Signed)
I let the patient son know that the patient needs an in office appointment because the patient has reached Rrt. He states we can call the nursing home at [phone] for them to bring her into the office. She is at the [facility].

## 2021-10-21 ENCOUNTER — Telehealth: Payer: Self-pay

## 2021-10-21 DIAGNOSIS — I428 Other cardiomyopathies: Secondary | ICD-10-CM

## 2021-10-21 DIAGNOSIS — I5042 Chronic combined systolic (congestive) and diastolic (congestive) heart failure: Secondary | ICD-10-CM

## 2021-10-21 NOTE — Telephone Encounter (Signed)
Thompson Grayer, MD sent to Damian Leavell, RN; Gracy Bruins P Results reviewed.  She has reached ERI.   Please schedule for office visit with me at next available.  Ideally my [facility].     Sonia Baller, echo previously ordered but not yet performed.  Will need echo asap.  As per Andy's last note, it may be beneficial to bring family member to discuss goals of care related to the upcoming generator change given her advanced age.  If necessary, could do as a virtual visit.

## 2021-10-21 NOTE — Telephone Encounter (Signed)
Left detailed message on son [name] [name]'s VM.  Advised echo had been reordered as stat per Dr. Rayann Heman.  Advised he would be getting a phone call to schedule this test.

## 2021-10-25 NOTE — Progress Notes (Signed)
Remote ICD transmission.   

## 2021-10-26 ENCOUNTER — Telehealth: Payer: Self-pay

## 2021-10-26 ENCOUNTER — Encounter: Payer: Medicare HMO | Admitting: Student

## 2021-10-26 ENCOUNTER — Encounter (HOSPITAL_BASED_OUTPATIENT_CLINIC_OR_DEPARTMENT_OTHER): Payer: Medicare HMO | Admitting: Internal Medicine

## 2021-10-26 DIAGNOSIS — I428 Other cardiomyopathies: Secondary | ICD-10-CM

## 2021-10-26 NOTE — Telephone Encounter (Signed)
Patient did not show for  scheduled appt with Dr. Rayann Heman today.  Pt has ICD which has reach ERI on 1/5, historically patient is dependant on PPM function of device.    Attempted to reach patient son, [name] who is listed as her POA.  No answer, left detailed message with device clinic # and hours to return phone call.    Also spoke with alternate emergency contact, Alvester Chou.  Advised of situation and importance of callback.  He will speak with [name] and request he call us.

## 2021-10-27 NOTE — Telephone Encounter (Signed)
Patient is scheduled with A. Tillery, PA 11/08/20 at 12:20 pm.

## 2021-10-27 NOTE — Telephone Encounter (Signed)
Spoke with son POA and we have rescheduled the appt and he verbalized understanding that he will be there as it is an important appointment that she needs to come too

## 2021-10-30 ENCOUNTER — Emergency Department (HOSPITAL_COMMUNITY): Payer: Medicare HMO

## 2021-10-30 ENCOUNTER — Encounter (HOSPITAL_COMMUNITY): Payer: Self-pay | Admitting: Emergency Medicine

## 2021-10-30 ENCOUNTER — Other Ambulatory Visit: Payer: Self-pay

## 2021-10-30 ENCOUNTER — Inpatient Hospital Stay (HOSPITAL_COMMUNITY)
Admission: EM | Admit: 2021-10-30 | Discharge: 2021-11-02 | DRG: 388 | Disposition: A | Discharge status: Nursing Home (Includes ICF) | Payer: Medicare HMO | Source: Skilled Nursing Facility | Attending: Family Medicine | Admitting: Family Medicine

## 2021-10-30 DIAGNOSIS — Z90710 Acquired absence of both cervix and uterus: Secondary | ICD-10-CM | POA: Diagnosis not present

## 2021-10-30 DIAGNOSIS — Z823 Family history of stroke: Secondary | ICD-10-CM | POA: Diagnosis not present

## 2021-10-30 DIAGNOSIS — E039 Hypothyroidism, unspecified: Secondary | ICD-10-CM | POA: Diagnosis present

## 2021-10-30 DIAGNOSIS — J9601 Acute respiratory failure with hypoxia: Secondary | ICD-10-CM | POA: Diagnosis present

## 2021-10-30 DIAGNOSIS — Z933 Colostomy status: Secondary | ICD-10-CM | POA: Diagnosis not present

## 2021-10-30 DIAGNOSIS — E1122 Type 2 diabetes mellitus with diabetic chronic kidney disease: Secondary | ICD-10-CM | POA: Diagnosis present

## 2021-10-30 DIAGNOSIS — I130 Hypertensive heart and chronic kidney disease with heart failure and stage 1 through stage 4 chronic kidney disease, or unspecified chronic kidney disease: Secondary | ICD-10-CM | POA: Diagnosis present

## 2021-10-30 DIAGNOSIS — Z7982 Long term (current) use of aspirin: Secondary | ICD-10-CM

## 2021-10-30 DIAGNOSIS — N1831 Chronic kidney disease, stage 3a: Secondary | ICD-10-CM | POA: Diagnosis present

## 2021-10-30 DIAGNOSIS — K8020 Calculus of gallbladder without cholecystitis without obstruction: Secondary | ICD-10-CM | POA: Diagnosis present

## 2021-10-30 DIAGNOSIS — J449 Chronic obstructive pulmonary disease, unspecified: Secondary | ICD-10-CM | POA: Diagnosis present

## 2021-10-30 DIAGNOSIS — Z66 Do not resuscitate: Secondary | ICD-10-CM | POA: Diagnosis present

## 2021-10-30 DIAGNOSIS — Z7951 Long term (current) use of inhaled steroids: Secondary | ICD-10-CM

## 2021-10-30 DIAGNOSIS — Z9049 Acquired absence of other specified parts of digestive tract: Secondary | ICD-10-CM | POA: Diagnosis not present

## 2021-10-30 DIAGNOSIS — K56609 Unspecified intestinal obstruction, unspecified as to partial versus complete obstruction: Secondary | ICD-10-CM | POA: Diagnosis not present

## 2021-10-30 DIAGNOSIS — R0902 Hypoxemia: Secondary | ICD-10-CM

## 2021-10-30 DIAGNOSIS — I2510 Atherosclerotic heart disease of native coronary artery without angina pectoris: Secondary | ICD-10-CM | POA: Diagnosis present

## 2021-10-30 DIAGNOSIS — N179 Acute kidney failure, unspecified: Secondary | ICD-10-CM | POA: Diagnosis present

## 2021-10-30 DIAGNOSIS — Z961 Presence of intraocular lens: Secondary | ICD-10-CM | POA: Diagnosis present

## 2021-10-30 DIAGNOSIS — Z9841 Cataract extraction status, right eye: Secondary | ICD-10-CM | POA: Diagnosis not present

## 2021-10-30 DIAGNOSIS — Z8719 Personal history of other diseases of the digestive system: Secondary | ICD-10-CM

## 2021-10-30 DIAGNOSIS — Z79890 Hormone replacement therapy (postmenopausal): Secondary | ICD-10-CM

## 2021-10-30 DIAGNOSIS — E785 Hyperlipidemia, unspecified: Secondary | ICD-10-CM | POA: Diagnosis present

## 2021-10-30 DIAGNOSIS — R1310 Dysphagia, unspecified: Secondary | ICD-10-CM | POA: Diagnosis present

## 2021-10-30 DIAGNOSIS — I5041 Acute combined systolic (congestive) and diastolic (congestive) heart failure: Secondary | ICD-10-CM | POA: Diagnosis present

## 2021-10-30 DIAGNOSIS — Z8249 Family history of ischemic heart disease and other diseases of the circulatory system: Secondary | ICD-10-CM | POA: Diagnosis not present

## 2021-10-30 DIAGNOSIS — Z9842 Cataract extraction status, left eye: Secondary | ICD-10-CM | POA: Diagnosis not present

## 2021-10-30 DIAGNOSIS — Z86711 Personal history of pulmonary embolism: Secondary | ICD-10-CM

## 2021-10-30 DIAGNOSIS — K56600 Partial intestinal obstruction, unspecified as to cause: Secondary | ICD-10-CM | POA: Diagnosis present

## 2021-10-30 DIAGNOSIS — Z20822 Contact with and (suspected) exposure to covid-19: Secondary | ICD-10-CM | POA: Diagnosis present

## 2021-10-30 DIAGNOSIS — Z85048 Personal history of other malignant neoplasm of rectum, rectosigmoid junction, and anus: Secondary | ICD-10-CM | POA: Diagnosis not present

## 2021-10-30 DIAGNOSIS — R140 Abdominal distension (gaseous): Secondary | ICD-10-CM

## 2021-10-30 DIAGNOSIS — Z79899 Other long term (current) drug therapy: Secondary | ICD-10-CM

## 2021-10-30 DIAGNOSIS — I509 Heart failure, unspecified: Secondary | ICD-10-CM

## 2021-10-30 DIAGNOSIS — J9600 Acute respiratory failure, unspecified whether with hypoxia or hypercapnia: Secondary | ICD-10-CM | POA: Diagnosis present

## 2021-10-30 DIAGNOSIS — I5043 Acute on chronic combined systolic (congestive) and diastolic (congestive) heart failure: Secondary | ICD-10-CM | POA: Diagnosis not present

## 2021-10-30 LAB — BLOOD GAS, ARTERIAL
Acid-Base Excess: 3.4 mmol/L — ABNORMAL HIGH (ref 0.0–2.0)
Bicarbonate: 26.4 mmol/L (ref 20.0–28.0)
FIO2: 21
O2 Saturation: 74.3 %
Patient temperature: 36.7
pCO2 arterial: 51.1 mmHg — ABNORMAL HIGH (ref 32.0–48.0)
pH, Arterial: 7.363 (ref 7.350–7.450)
pO2, Arterial: 44.4 mmHg — ABNORMAL LOW (ref 83.0–108.0)

## 2021-10-30 LAB — LACTIC ACID, PLASMA
Lactic Acid, Venous: 0.9 mmol/L (ref 0.5–1.9)
Lactic Acid, Venous: 2.1 mmol/L (ref 0.5–1.9)
Lactic Acid, Venous: 1.3 mmol/L (ref 0.5–1.9)

## 2021-10-30 LAB — PROTIME-INR
INR: 1.1 (ref 0.8–1.2)
Prothrombin Time: 14.5 seconds (ref 11.4–15.2)

## 2021-10-30 LAB — COMPREHENSIVE METABOLIC PANEL
ALT: 20 U/L (ref 0–44)
AST: 18 U/L (ref 15–41)
Albumin: 4 g/dL (ref 3.5–5.0)
Alkaline Phosphatase: 77 U/L (ref 38–126)
Anion gap: 9 (ref 5–15)
BUN: 31 mg/dL — ABNORMAL HIGH (ref 8–23)
CO2: 28 mmol/L (ref 22–32)
Calcium: 9 mg/dL (ref 8.9–10.3)
Chloride: 101 mmol/L (ref 98–111)
Creatinine, Ser: 2.02 mg/dL — ABNORMAL HIGH (ref 0.44–1.00)
GFR, Estimated: 24 mL/min — ABNORMAL LOW (ref >60)
Glucose, Bld: 112 mg/dL — ABNORMAL HIGH (ref 70–99)
Potassium: 4.4 mmol/L (ref 3.5–5.1)
Sodium: 138 mmol/L (ref 135–145)
Total Bilirubin: 0.4 mg/dL (ref 0.3–1.2)
Total Protein: 7.4 g/dL (ref 6.5–8.1)

## 2021-10-30 LAB — CBC WITH DIFFERENTIAL/PLATELET
Abs Immature Granulocytes: 0.03 10*3/uL (ref 0.00–0.07)
Basophils Absolute: 0 10*3/uL (ref 0.0–0.1)
Basophils Relative: 0 %
Eosinophils Absolute: 0.3 10*3/uL (ref 0.0–0.5)
Eosinophils Relative: 5 %
HCT: 36.4 % (ref 36.0–46.0)
Hemoglobin: 11.1 g/dL — ABNORMAL LOW (ref 12.0–15.0)
Immature Granulocytes: 0 %
Lymphocytes Relative: 12 %
Lymphs Abs: 0.8 10*3/uL (ref 0.7–4.0)
MCH: 30.8 pg (ref 26.0–34.0)
MCHC: 30.5 g/dL (ref 30.0–36.0)
MCV: 101.1 fL — ABNORMAL HIGH (ref 80.0–100.0)
Monocytes Absolute: 0.6 10*3/uL (ref 0.1–1.0)
Monocytes Relative: 9 %
Neutro Abs: 5 10*3/uL (ref 1.7–7.7)
Neutrophils Relative %: 74 %
Platelets: 213 10*3/uL (ref 150–400)
RBC: 3.6 MIL/uL — ABNORMAL LOW (ref 3.87–5.11)
RDW: 15.9 % — ABNORMAL HIGH (ref 11.5–15.5)
WBC: 6.8 10*3/uL (ref 4.0–10.5)
nRBC: 0 % (ref 0.0–0.2)

## 2021-10-30 LAB — RESP PANEL BY RT-PCR (FLU A&B, COVID) ARPGX2
Influenza A by PCR: NEGATIVE
Influenza B by PCR: NEGATIVE
SARS Coronavirus 2 by RT PCR: NEGATIVE

## 2021-10-30 LAB — APTT: aPTT: 32 seconds (ref 24–36)

## 2021-10-30 MED ORDER — SODIUM CHLORIDE 0.9 % IV SOLN
2.0000 g | Freq: Once | INTRAVENOUS | Status: AC
  Administered 2021-10-30: 2 g via INTRAVENOUS
  Filled 2021-10-30: qty 20

## 2021-10-30 MED ORDER — LACTATED RINGERS IV SOLN: INTRAVENOUS | Status: AC

## 2021-10-30 MED ORDER — METRONIDAZOLE 500 MG/100ML IV SOLN
500.0000 mg | Freq: Once | INTRAVENOUS | Status: AC
  Administered 2021-10-30: 500 mg via INTRAVENOUS
  Filled 2021-10-30: qty 100

## 2021-10-30 NOTE — ED Notes (Signed)
Both sets of blood cultures obtained before antibiotic administration  

## 2021-10-30 NOTE — ED Triage Notes (Signed)
Pt arrived via [facility] with c/o abdominal pain and weakness x 1 day. Per EMS and Assisted living facility, has become very lethargic x 1 day

## 2021-10-30 NOTE — ED Notes (Signed)
Attempted to obtain IV access multiple times. Was unsuccessful on all attempts. MD made away--MD obtained IV access via the EJ.

## 2021-10-30 NOTE — ED Notes (Signed)
Pt transported to CT ?

## 2021-10-30 NOTE — ED Provider Notes (Signed)
[facility] Provider Note   CSN: [id number] Arrival date & time: 10/30/21  1649     History  No chief complaint on file.   [name] is a 88 y.o. female.  HPI  This patient is an 88 year old female, she has a known history of rectal cancer status post diverting ostomy after colectomy.  She presents to the hospital today complaining of some altered mental status, the complaint actually comes from the nursing facility who found her to be less active, not able to get out of bed and seemed less arousable.  Evidently she has been on Bactrim for the last 8 days for a urinary tract infection but today she had a significant downturn in her mental status.  No vomiting or diarrhea was reported.  Paramedics did not really give any vital signs prehospital but she was found to be hypoxic on arrival  Home Medications Prior to Admission medications   Medication Sig Start Date End Date Taking? Authorizing Provider  acetaminophen (TYLENOL) 500 MG tablet Take 500-1,000 mg by mouth See admin instructions. Take 2 tablets (1000 mg) by mouth every morning, may also take 1 tablet (500  mg) every 6 hours as needed for fever 99.5-101 F, minor headache, minor discomfort    [provider]  alum & mag hydroxide-simeth (MAALOX/MYLANTA) 200-200-20 MG/5ML suspension Take 30 mLs by mouth every 6 (six) hours as needed for heartburn or indigestion.    [provider]  aspirin EC 81 MG tablet Take 1 tablet (81 mg total) by mouth every morning. 03/03/15   Rai, Ripudeep K, MD  atorvastatin (LIPITOR) 20 MG tablet Take 20 mg by mouth at bedtime.     [provider]  carboxymethylcellulose 1 % ophthalmic solution Place 1 drop into both eyes 3 (three) times daily.     [provider]  carvedilol (COREG) 6.25 MG tablet Take 6.25 mg by mouth 2 (two) times daily with a meal.     [provider]  cyclobenzaprine (FLEXERIL) 5 MG tablet Take 5 mg by mouth at  bedtime.    [provider]  famotidine (PEPCID) 20 MG tablet Take 1 tablet (20 mg total) by mouth daily. 02/08/20   Annita Brod, MD  fluticasone-salmeterol (ADVAIR) 500-50 MCG/ACT AEPB Inhale 1 puff into the lungs in the morning and at bedtime.    [provider]  furosemide (LASIX) 40 MG tablet Take 40 mg by mouth daily.    [provider]  guaifenesin (ROBITUSSIN) 100 MG/5ML syrup Take 200 mg by mouth every 6 (six) hours as needed for cough.    [provider]  hydrALAZINE (APRESOLINE) 50 MG tablet Take 1 tablet (50 mg total) by mouth 3 (three) times daily. 03/15/21   Shelly Coss, MD  iron polysaccharides (NIFEREX) 150 MG capsule Take 150 mg by mouth 2 (two) times daily.    [provider]  isosorbide mononitrate (IMDUR) 60 MG 24 hr tablet Take 1 tablet (60 mg total) by mouth daily. 02/08/20   Annita Brod, MD  levothyroxine (SYNTHROID, LEVOTHROID) 50 MCG tablet Take 50 mcg by mouth daily at 6 (six) AM. 01/16/15   [provider]  loperamide (IMODIUM) 2 MG capsule Take 2 mg by mouth as needed for diarrhea or loose stools (max 8 doses in 24 hours).    [provider]  loratadine (CLARITIN) 10 MG tablet Take 10 mg by mouth daily.    [provider]  magnesium hydroxide (MILK OF MAGNESIA)  400 MG/5ML suspension Take 30 mLs by mouth at bedtime as needed for mild constipation.    [provider]  meclizine (ANTIVERT) 25 MG tablet Take 25 mg by mouth every 8 (eight) hours as needed for dizziness.    [provider]  Menthol-Methyl Salicylate (BENGAY ARTHRITIS FORMULA EX) Apply 1 application topically 2 (two) times daily as needed (knee pain).    [provider]  Neomycin-Bacitracin-Polymyxin (TRIPLE ANTIBIOTIC) [phone] OINT Apply 1 application topically daily as needed (minor skin tears/abrasians).    [provider]  nystatin (MYCOSTATIN/NYSTOP) powder Apply 1 application topically  See admin instructions. Apply to rash around stoma on stoma care days, when replacing bag - on Mondays    [provider]  ondansetron (ZOFRAN) 4 MG tablet Take 1 tablet (4 mg total) by mouth every 6 (six) hours as needed for nausea or vomiting. 5/40/08   Delora Fuel, MD  polyethylene glycol [facility] / Floria Raveling) packet Take 17 g by mouth daily as needed (constipation). Mix with 8 ounces of fluid and drink    [provider]  sodium chloride (OCEAN) 0.65 % SOLN nasal spray Place 1 spray into both nostrils every 12 (twelve) hours as needed for congestion.    [provider]  vitamin C (ASCORBIC ACID) 250 MG tablet Take 250 mg by mouth 2 (two) times daily.    [provider]      Allergies    Patient has no known allergies.    Review of Systems   Review of Systems  All other systems reviewed and are negative.  Physical Exam Updated Vital Signs There were no vitals taken for this visit. Physical Exam Vitals and nursing note reviewed.  Constitutional:      General: She is in acute distress.     Appearance: She is well-developed. She is ill-appearing.  HENT:     Head: Normocephalic and atraumatic.     Nose: Nose normal. No congestion or rhinorrhea.     Mouth/Throat:     Mouth: Mucous membranes are moist.     Pharynx: No oropharyngeal exudate.  Eyes:     General: No scleral icterus.       Right eye: No discharge.        Left eye: No discharge.     Conjunctiva/sclera: Conjunctivae normal.     Pupils: Pupils are equal, round, and reactive to light.  Neck:     Thyroid: No thyromegaly.     Vascular: No JVD.  Cardiovascular:     Rate and Rhythm: Normal rate and regular rhythm.     Heart sounds: Normal heart sounds. No murmur heard.   No friction rub. No gallop.  Pulmonary:     Effort: Respiratory distress present.     Breath sounds: No wheezing or rales.     Comments: Rales bilaterally - mild tachypneic  Abdominal:     General: Bowel sounds are  normal. There is distension.     Palpations: Abdomen is soft. There is no mass.     Tenderness: There is no abdominal tenderness.     Comments: Tympanitic to percussion - colostomy bag without air or stool in it  Musculoskeletal:        General: No tenderness. Normal range of motion.     Cervical back: Normal range of motion and neck supple.  Lymphadenopathy:     Cervical: No cervical adenopathy.  Skin:    General: Skin is warm and dry.     Findings: No erythema  or rash.  Neurological:     Mental Status: She is alert.     Coordination: Coordination normal.     Comments: Able to move all 4 extremities equally, slight generalized weakness but able to answer questions appropriately but slowly  Psychiatric:        Behavior: Behavior normal.    ED Results / Procedures / Treatments   Labs (all labs ordered are listed, but only abnormal results are displayed) Labs Reviewed  RESP PANEL BY RT-PCR (FLU A&B, COVID) ARPGX2  CULTURE, BLOOD (ROUTINE X 2)  CULTURE, BLOOD (ROUTINE X 2)  URINE CULTURE  LACTIC ACID, PLASMA  LACTIC ACID, PLASMA  COMPREHENSIVE METABOLIC PANEL  CBC WITH DIFFERENTIAL/PLATELET  PROTIME-INR  APTT  URINALYSIS, ROUTINE W REFLEX MICROSCOPIC  BLOOD GAS, VENOUS    EKG EKG Interpretation  Date/Time:  Sunday October 30 2021 17:04:39 EST Ventricular Rate:  97 PR Interval:  197 QRS Duration: 179 QT Interval:  403 QTC Calculation: 512 R Axis:   123 Text Interpretation: Sinus rhythm Nonspecific intraventricular conduction delay Repol abnrm suggests ischemia, inferior leads Baseline wander in lead(s) V6 Confirmed by Noemi Chapel [phone]) on 10/30/2021 5:15:18 PM  Radiology CT ABDOMEN PELVIS WO CONTRAST  Result Date: 10/30/2021 CLINICAL DATA:  Abdominal pain, acute, nonlocalized; Chest pain, nonspecific SOB, hypoxia. Unspecified abdominal pain, generalized weakness. EXAM: CT CHEST, ABDOMEN AND PELVIS WITHOUT CONTRAST TECHNIQUE: Multidetector CT imaging of the chest,  abdomen and pelvis was performed following the standard protocol without IV contrast. RADIATION DOSE REDUCTION: This exam was performed according to the departmental dose-optimization program which includes automated exposure control, adjustment of the mA and/or kV according to patient size and/or use of iterative reconstruction technique. COMPARISON:  04/22/2017 FINDINGS: CT CHEST FINDINGS Cardiovascular: Moderate multi-vessel coronary artery calcification, predominantly within the left anterior descending and left main coronary arteries. Extensive calcification of the mitral valve annulus. Calcification of the aortic valve leaflets noted. Mild-to-moderate global cardiomegaly appears stable since prior examination. Left subclavian dual lead pacemaker leads noted within the right atrium and right ventricle. No pericardial effusion. Central pulmonary arteries are enlarged in keeping with changes of pulmonary arterial hypertension. The thoracic aorta demonstrates moderate atherosclerotic calcification. Bovine arch anatomy noted. The ascending aorta is dilated measuring 4.2 cm in greatest dimension (coronal image # 84/5, stable since prior examination. The descending thoracic aorta is of normal caliber. Mediastinum/Nodes: Asymmetric enlargement of the right thyroid gland is unchanged, likely anatomic variation given its stability over time. No pathologic thoracic adenopathy. The esophagus is unremarkable. Small hiatal hernia. Lungs/Pleura: The pulmonary venous vasculature is engorged centrally and there is subtle diffuse parenchymal ground-glass opacity demonstrating a basilar gradient which is suspicious for mild alveolar pulmonary edema, likely cardiogenic in nature. No pneumothorax or pleural effusion. Musculoskeletal: The osseous structures are diffusely osteopenic. No acute bone abnormality. No lytic or blastic bone lesion. CT ABDOMEN PELVIS FINDINGS Hepatobiliary: Cholelithiasis without pericholecystic  inflammatory change noted. The liver is unremarkable. No intra or extrahepatic biliary ductal dilation. Pancreas: Unremarkable Spleen: Unremarkable Adrenals/Urinary Tract: The adrenal glands are unremarkable. The kidneys are normal in size and position. Simple cortical cyst noted within the lower pole the right kidney. Probable vascular calcifications within the renal hila bilaterally. The kidneys are otherwise unremarkable. The bladder is unremarkable. Stomach/Bowel: Surgical changes of abdominal peroneal resection and left lower quadrant descending colostomy are identified. There is moderate stool seen throughout the colon with a rapid transition in the caliber of the colon at the stomal hiatus through the abdominal fascia, best seen  on axial image # 88/2, suggesting a partial large bowel obstruction. There is a parastomal hernia associated with a single loop of mid jejunum within the hernia sac. This results in a mild partial small bowel obstruction with mildly dilated loops of small bowel upstream from the hernia though gas and fluid is seen within the more distal small bowel. The appendix is not visualized and is likely absent. No free intraperitoneal gas or fluid. Vascular/Lymphatic: Aortic atherosclerosis. No enlarged abdominal or pelvic lymph nodes. Reproductive: Status post hysterectomy. No adnexal masses. Other: None Musculoskeletal: Stable remote superior endplate fracture of L1. Mild interval compression deformity of L5, likely remote in nature without evidence of retropulsion. The osseous structures are diffusely osteopenic. No acute bone abnormality identified. IMPRESSION: Moderate multi-vessel coronary artery calcification. Stable cardiomegaly. Morphologic changes in keeping with pulmonary arterial hypertension. Mild diffuse, basilar predominant parenchymal ground-glass opacity suspicious for mild alveolar pulmonary edema, likely cardiogenic in nature. Calcification of the aortic valve leaflets.  Echocardiography may be helpful to assess the degree of valvular dysfunction. Mild dilation of the ascending aorta with maximal diameter of 4.2 cm, stable since prior examination of 04/22/2017. Recommend annual imaging followup by CTA or MRA. This recommendation follows 2010 ACCF/AHA/AATS/ACR/ASA/SCA/SCAI/SIR/STS/SVM Guidelines for the Diagnosis and Management of Patients with Thoracic Aortic Disease. Circulation. 2010; 121: N462-V035. Aortic aneurysm NOS (ICD10-I71.9) Cholelithiasis. Surgical changes of abdominal peroneal resection and descending colostomy. Moderate retained stool throughout the colon and rapid transition at the abdominal fascial hiatus suggests a partial large bowel obstruction. Superimposed parastomal hernia containing a single loop of small bowel with mild dilation of the upstream small bowel in keeping with a mild partial small bowel obstruction. Osteopenia. Interval compression deformity of L5, likely remote in nature. Stable compression deformity of L1. No retropulsion. Aortic aneurysm NOS (ICD10-I71.9). Aortic Atherosclerosis (ICD10-I70.0). Electronically Signed   By: Fidela Salisbury M.D.   On: 10/30/2021 20:29   CT Chest Wo Contrast  Result Date: 10/30/2021 CLINICAL DATA:  Abdominal pain, acute, nonlocalized; Chest pain, nonspecific SOB, hypoxia. Unspecified abdominal pain, generalized weakness. EXAM: CT CHEST, ABDOMEN AND PELVIS WITHOUT CONTRAST TECHNIQUE: Multidetector CT imaging of the chest, abdomen and pelvis was performed following the standard protocol without IV contrast. RADIATION DOSE REDUCTION: This exam was performed according to the departmental dose-optimization program which includes automated exposure control, adjustment of the mA and/or kV according to patient size and/or use of iterative reconstruction technique. COMPARISON:  04/22/2017 FINDINGS: CT CHEST FINDINGS Cardiovascular: Moderate multi-vessel coronary artery calcification, predominantly within the left anterior  descending and left main coronary arteries. Extensive calcification of the mitral valve annulus. Calcification of the aortic valve leaflets noted. Mild-to-moderate global cardiomegaly appears stable since prior examination. Left subclavian dual lead pacemaker leads noted within the right atrium and right ventricle. No pericardial effusion. Central pulmonary arteries are enlarged in keeping with changes of pulmonary arterial hypertension. The thoracic aorta demonstrates moderate atherosclerotic calcification. Bovine arch anatomy noted. The ascending aorta is dilated measuring 4.2 cm in greatest dimension (coronal image # 84/5, stable since prior examination. The descending thoracic aorta is of normal caliber. Mediastinum/Nodes: Asymmetric enlargement of the right thyroid gland is unchanged, likely anatomic variation given its stability over time. No pathologic thoracic adenopathy. The esophagus is unremarkable. Small hiatal hernia. Lungs/Pleura: The pulmonary venous vasculature is engorged centrally and there is subtle diffuse parenchymal ground-glass opacity demonstrating a basilar gradient which is suspicious for mild alveolar pulmonary edema, likely cardiogenic in nature. No pneumothorax or pleural effusion. Musculoskeletal: The osseous structures are diffusely osteopenic.  No acute bone abnormality. No lytic or blastic bone lesion. CT ABDOMEN PELVIS FINDINGS Hepatobiliary: Cholelithiasis without pericholecystic inflammatory change noted. The liver is unremarkable. No intra or extrahepatic biliary ductal dilation. Pancreas: Unremarkable Spleen: Unremarkable Adrenals/Urinary Tract: The adrenal glands are unremarkable. The kidneys are normal in size and position. Simple cortical cyst noted within the lower pole the right kidney. Probable vascular calcifications within the renal hila bilaterally. The kidneys are otherwise unremarkable. The bladder is unremarkable. Stomach/Bowel: Surgical changes of abdominal peroneal  resection and left lower quadrant descending colostomy are identified. There is moderate stool seen throughout the colon with a rapid transition in the caliber of the colon at the stomal hiatus through the abdominal fascia, best seen on axial image # 88/2, suggesting a partial large bowel obstruction. There is a parastomal hernia associated with a single loop of mid jejunum within the hernia sac. This results in a mild partial small bowel obstruction with mildly dilated loops of small bowel upstream from the hernia though gas and fluid is seen within the more distal small bowel. The appendix is not visualized and is likely absent. No free intraperitoneal gas or fluid. Vascular/Lymphatic: Aortic atherosclerosis. No enlarged abdominal or pelvic lymph nodes. Reproductive: Status post hysterectomy. No adnexal masses. Other: None Musculoskeletal: Stable remote superior endplate fracture of L1. Mild interval compression deformity of L5, likely remote in nature without evidence of retropulsion. The osseous structures are diffusely osteopenic. No acute bone abnormality identified. IMPRESSION: Moderate multi-vessel coronary artery calcification. Stable cardiomegaly. Morphologic changes in keeping with pulmonary arterial hypertension. Mild diffuse, basilar predominant parenchymal ground-glass opacity suspicious for mild alveolar pulmonary edema, likely cardiogenic in nature. Calcification of the aortic valve leaflets. Echocardiography may be helpful to assess the degree of valvular dysfunction. Mild dilation of the ascending aorta with maximal diameter of 4.2 cm, stable since prior examination of 04/22/2017. Recommend annual imaging followup by CTA or MRA. This recommendation follows 2010 ACCF/AHA/AATS/ACR/ASA/SCA/SCAI/SIR/STS/SVM Guidelines for the Diagnosis and Management of Patients with Thoracic Aortic Disease. Circulation. 2010; 121: I097-D532. Aortic aneurysm NOS (ICD10-I71.9) Cholelithiasis. Surgical changes of  abdominal peroneal resection and descending colostomy. Moderate retained stool throughout the colon and rapid transition at the abdominal fascial hiatus suggests a partial large bowel obstruction. Superimposed parastomal hernia containing a single loop of small bowel with mild dilation of the upstream small bowel in keeping with a mild partial small bowel obstruction. Osteopenia. Interval compression deformity of L5, likely remote in nature. Stable compression deformity of L1. No retropulsion. Aortic aneurysm NOS (ICD10-I71.9). Aortic Atherosclerosis (ICD10-I70.0). Electronically Signed   By: Fidela Salisbury M.D.   On: 10/30/2021 20:29   DG Chest Port 1 View  Result Date: 10/30/2021 CLINICAL DATA:  Shortness of breath.  Abdominal pain and weakness. EXAM: PORTABLE CHEST 1 VIEW COMPARISON:  02/06/2020 FINDINGS: AICD noted. Low lung volumes are present, causing crowding of the pulmonary vasculature. Mild to moderate cardiomegaly. Indistinct left hemidiaphragm, this has been present on prior exams and accordingly may be configuration ule but I cannot exclude a left lower lobe airspace opacity. Atherosclerotic calcification of the aortic arch. IMPRESSION: 1. Low lung volumes are present, causing crowding of the pulmonary vasculature. 2.  Aortic Atherosclerosis (ICD10-I70.0). 3. 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 Part of the left lung base is also covered by the patient's AICD pulse  generator. Electronically Signed   By: Van Clines M.D.   On: 10/30/2021 17:18    Procedures .Critical Care Performed by: Noemi Chapel, MD Authorized by: Noemi Chapel, MD   Critical care provider statement:    Critical care time (minutes):  30   Critical care time was exclusive of:  Separately billable  procedures and treating other patients and teaching time   Critical care was necessary to treat or prevent imminent or life-threatening deterioration of the following conditions:  Renal failure and respiratory failure   Critical care was time spent personally by me on the following activities:  Development of treatment plan with patient or surrogate, discussions with consultants, evaluation of patient's response to treatment, examination of patient, ordering and review of laboratory studies, ordering and review of radiographic studies, ordering and performing treatments and interventions, pulse oximetry, re-evaluation of patient's condition, review of old charts and obtaining history from patient or surrogate   I assumed direction of critical care for this patient from another provider in my specialty: no     Care discussed with: admitting provider   Comments:          Medications Ordered in ED Medications  lactated ringers infusion (has no administration in time range)  cefTRIAXone (ROCEPHIN) 2 g in sodium chloride 0.9 % 100 mL IVPB (has no administration in time range)  metroNIDAZOLE (FLAGYL) IVPB 500 mg (has no administration in time range)    ED Course/ Medical Decision Making/ A&P                           Medical Decision Making Amount and/or Complexity of Data Reviewed Labs: ordered. Radiology: ordered. ECG/medicine tests: ordered.  Risk Prescription drug management. Decision regarding hospitalization.   This patient presents to the ED for concern of altered mental status and generalized weakness, this involves an extensive number of treatment options, and is a complaint that carries with it a high risk of complications and morbidity.  The differential diagnosis includes infection, sepsis, urinary tract infection, pyelonephritis, pneumonia, COVID-19, would also consider bowel obstruction   Co morbidities that complicate the patient evaluation  Severe disability, lack of  mobility, history of cancer   Additional history obtained:  Additional history obtained from medical record and paramedics External records from outside source obtained and reviewed including patient was last seen due to nonischemic cardiomyopathy on January 9 by cardiology, receives iron deficiency anemia infusions, history of rectal cancer, admitted for dehydration about 7 months ago.   Lab Tests:  I Ordered, and personally interpreted labs.  The pertinent results include: An ABG showing hypoxia, COVID and flu negative, blood cultures were drawn, lactic was 0.9, compressive metabolic panel with an acute kidney injury with a creatinine of 2.0, last checked at 1.1.  CBC showed mild anemia but no leukocytosis   Imaging Studies ordered:  I ordered imaging studies including chest x-ray showing low lung volumes, possibly some vascular crowding.  Possible lower lobe pneumonia though it is not very clear, CT scans were ordered I independently visualized and interpreted imaging which showed large and small bowel parital obstructions with possible SB herniated parastomal location I agree with the radiologist interpretation   Cardiac Monitoring:  The patient was maintained on a cardiac monitor.  I personally viewed and interpreted the cardiac monitored which showed an underlying rhythm of: Normal sinus rhythm, borderline tachycardia   Medicines ordered and prescription drug management:  I ordered medication including antibiotics including Rocephin  and Flagyl for possible intra-abdominal pathology given the abdominal distention and altered mental status, thankfully this covered for possible pneumonia as well Reevaluation of the patient after these medicines showed that the patient improved I have reviewed the patients home medicines and have made adjustments as needed   Test Considered:  CT scan of the abdomen and pelvis as well as the chest which was ultimately ordered due to unexplained  cause of hypoxia   Critical Interventions:  IV fluids, antibiotics, supportive oxygenation   Consultations Obtained:  I requested consultation with the hospitalist,  and discussed lab and imaging findings as well as pertinent plan - they recommend: CT scans of the abdomen and pelvis as well as the chest, admission.  Discussed with general surgery Dr. Arnoldo Morale - will see pti in AM.  Dr. Nori Riis will admit.    Reevaluation:  After the interventions noted above, I reevaluated the patient and found that they have :improved   Social Determinants of Health:  Nursing facility   Dispostion:  After consideration of the diagnostic results and the patients response to treatment, I feel that the patent would benefit from admission to the hospital.          Final Clinical Impression(s) / ED Diagnoses Final diagnoses:  Hypoxia  AKI (acute kidney injury) ([place])  Abdominal distention     Noemi Chapel, MD 10/30/21 2241

## 2021-10-30 NOTE — ED Notes (Signed)
Date and time results received: 10/30/21 2008 (use smartphrase ".now" to insert current time)  Test: Lactic Critical Value: 2.1  Name of Provider Notified: Hazle Nordmann, MD

## 2021-10-31 ENCOUNTER — Inpatient Hospital Stay (HOSPITAL_COMMUNITY): Payer: Medicare HMO

## 2021-10-31 ENCOUNTER — Encounter (HOSPITAL_COMMUNITY): Payer: Self-pay | Admitting: Family Medicine

## 2021-10-31 DIAGNOSIS — N1831 Chronic kidney disease, stage 3a: Secondary | ICD-10-CM

## 2021-10-31 DIAGNOSIS — N179 Acute kidney failure, unspecified: Secondary | ICD-10-CM

## 2021-10-31 DIAGNOSIS — E039 Hypothyroidism, unspecified: Secondary | ICD-10-CM

## 2021-10-31 DIAGNOSIS — K56609 Unspecified intestinal obstruction, unspecified as to partial versus complete obstruction: Secondary | ICD-10-CM | POA: Diagnosis not present

## 2021-10-31 DIAGNOSIS — I5043 Acute on chronic combined systolic (congestive) and diastolic (congestive) heart failure: Secondary | ICD-10-CM

## 2021-10-31 DIAGNOSIS — I5041 Acute combined systolic (congestive) and diastolic (congestive) heart failure: Secondary | ICD-10-CM

## 2021-10-31 DIAGNOSIS — J9601 Acute respiratory failure with hypoxia: Secondary | ICD-10-CM

## 2021-10-31 LAB — COMPREHENSIVE METABOLIC PANEL
ALT: 18 U/L (ref 0–44)
AST: 18 U/L (ref 15–41)
Albumin: 3.6 g/dL (ref 3.5–5.0)
Alkaline Phosphatase: 68 U/L (ref 38–126)
Anion gap: 8 (ref 5–15)
BUN: 31 mg/dL — ABNORMAL HIGH (ref 8–23)
CO2: 28 mmol/L (ref 22–32)
Calcium: 8.8 mg/dL — ABNORMAL LOW (ref 8.9–10.3)
Chloride: 102 mmol/L (ref 98–111)
Creatinine, Ser: 1.73 mg/dL — ABNORMAL HIGH (ref 0.44–1.00)
GFR, Estimated: 29 mL/min — ABNORMAL LOW (ref >60)
Glucose, Bld: 102 mg/dL — ABNORMAL HIGH (ref 70–99)
Potassium: 4 mmol/L (ref 3.5–5.1)
Sodium: 138 mmol/L (ref 135–145)
Total Bilirubin: 0.5 mg/dL (ref 0.3–1.2)
Total Protein: 6.9 g/dL (ref 6.5–8.1)

## 2021-10-31 LAB — PROCALCITONIN: Procalcitonin: 0.19 ng/mL

## 2021-10-31 LAB — URINALYSIS, ROUTINE W REFLEX MICROSCOPIC
Bilirubin Urine: NEGATIVE
Glucose, UA: NEGATIVE mg/dL
Hgb urine dipstick: NEGATIVE
Ketones, ur: NEGATIVE mg/dL
Leukocytes,Ua: NEGATIVE
Nitrite: NEGATIVE
Protein, ur: 30 mg/dL — AB
Specific Gravity, Urine: 1.016 (ref 1.005–1.030)
pH: 5 (ref 5.0–8.0)

## 2021-10-31 LAB — CBC WITH DIFFERENTIAL/PLATELET
Abs Immature Granulocytes: 0.01 10*3/uL (ref 0.00–0.07)
Basophils Absolute: 0 10*3/uL (ref 0.0–0.1)
Basophils Relative: 0 %
Eosinophils Absolute: 0.3 10*3/uL (ref 0.0–0.5)
Eosinophils Relative: 4 %
HCT: 34.7 % — ABNORMAL LOW (ref 36.0–46.0)
Hemoglobin: 10.6 g/dL — ABNORMAL LOW (ref 12.0–15.0)
Immature Granulocytes: 0 %
Lymphocytes Relative: 12 %
Lymphs Abs: 0.7 10*3/uL (ref 0.7–4.0)
MCH: 30.5 pg (ref 26.0–34.0)
MCHC: 30.5 g/dL (ref 30.0–36.0)
MCV: 100 fL (ref 80.0–100.0)
Monocytes Absolute: 0.6 10*3/uL (ref 0.1–1.0)
Monocytes Relative: 10 %
Neutro Abs: 4.5 10*3/uL (ref 1.7–7.7)
Neutrophils Relative %: 74 %
Platelets: 183 10*3/uL (ref 150–400)
RBC: 3.47 MIL/uL — ABNORMAL LOW (ref 3.87–5.11)
RDW: 15.8 % — ABNORMAL HIGH (ref 11.5–15.5)
WBC: 6.2 10*3/uL (ref 4.0–10.5)
nRBC: 0 % (ref 0.0–0.2)

## 2021-10-31 LAB — MAGNESIUM: Magnesium: 2.3 mg/dL (ref 1.7–2.4)

## 2021-10-31 LAB — ECHOCARDIOGRAM COMPLETE
AR max vel: 1.75 cm2
AV Area VTI: 1.88 cm2
AV Area mean vel: 1.81 cm2
AV Mean grad: 7 mmHg
AV Peak grad: 14.3 mmHg
Ao pk vel: 1.89 m/s
Area-P 1/2: 5.31 cm2
Height: 66 in
MV VTI: 1.68 cm2
P 1/2 time: 363 msec
S' Lateral: 2.9 cm
Weight: 3093.49 oz

## 2021-10-31 LAB — TSH: TSH: 0.734 u[IU]/mL (ref 0.350–4.500)

## 2021-10-31 LAB — BRAIN NATRIURETIC PEPTIDE: B Natriuretic Peptide: 1187 pg/mL — ABNORMAL HIGH (ref 0.0–100.0)

## 2021-10-31 LAB — MRSA NEXT GEN BY PCR, NASAL: MRSA by PCR Next Gen: NOT DETECTED

## 2021-10-31 LAB — GLUCOSE, CAPILLARY
Glucose-Capillary: 145 mg/dL — ABNORMAL HIGH (ref 70–99)
Glucose-Capillary: 78 mg/dL (ref 70–99)

## 2021-10-31 LAB — TROPONIN I (HIGH SENSITIVITY)
Troponin I (High Sensitivity): 318 ng/L (ref <18)
Troponin I (High Sensitivity): 244 ng/L (ref <18)

## 2021-10-31 LAB — LACTIC ACID, PLASMA: Lactic Acid, Venous: 0.7 mmol/L (ref 0.5–1.9)

## 2021-10-31 MED ORDER — FAMOTIDINE 20 MG PO TABS
20.0000 mg | ORAL_TABLET | Freq: Every day | ORAL | Status: DC
  Administered 2021-10-31 – 2021-11-02 (×3): 20 mg via ORAL
  Filled 2021-10-31 (×3): qty 1

## 2021-10-31 MED ORDER — MOMETASONE FURO-FORMOTEROL FUM 200-5 MCG/ACT IN AERO
2.0000 | INHALATION_SPRAY | Freq: Two times a day (BID) | RESPIRATORY_TRACT | Status: DC
  Administered 2021-10-31 – 2021-11-02 (×5): 2 via RESPIRATORY_TRACT
  Filled 2021-10-31: qty 8.8

## 2021-10-31 MED ORDER — HEPARIN SODIUM (PORCINE) 5000 UNIT/ML IJ SOLN
5000.0000 [IU] | Freq: Three times a day (TID) | INTRAMUSCULAR | Status: DC
  Administered 2021-10-31 – 2021-11-02 (×6): 5000 [IU] via SUBCUTANEOUS
  Filled 2021-10-31 (×6): qty 1

## 2021-10-31 MED ORDER — PERFLUTREN LIPID MICROSPHERE
1.0000 mL | INTRAVENOUS | Status: AC | PRN
  Administered 2021-10-31: 6 mL via INTRAVENOUS
  Filled 2021-10-31: qty 10

## 2021-10-31 MED ORDER — MORPHINE SULFATE (PF) 2 MG/ML IV SOLN: 2.0000 mg | INTRAVENOUS | Status: DC | PRN

## 2021-10-31 MED ORDER — FUROSEMIDE 10 MG/ML IJ SOLN
40.0000 mg | Freq: Two times a day (BID) | INTRAMUSCULAR | Status: DC
  Administered 2021-10-31 – 2021-11-02 (×6): 40 mg via INTRAVENOUS
  Filled 2021-10-31 (×6): qty 4

## 2021-10-31 MED ORDER — MECLIZINE HCL 12.5 MG PO TABS: 25.0000 mg | ORAL_TABLET | Freq: Three times a day (TID) | ORAL | Status: DC | PRN

## 2021-10-31 MED ORDER — ASPIRIN EC 81 MG PO TBEC
81.0000 mg | DELAYED_RELEASE_TABLET | Freq: Every day | ORAL | Status: DC
  Administered 2021-10-31 – 2021-11-02 (×3): 81 mg via ORAL
  Filled 2021-10-31 (×3): qty 1

## 2021-10-31 MED ORDER — ONDANSETRON HCL 4 MG/2ML IJ SOLN
4.0000 mg | Freq: Four times a day (QID) | INTRAMUSCULAR | Status: DC | PRN
  Administered 2021-10-31: 4 mg via INTRAVENOUS
  Filled 2021-10-31: qty 2

## 2021-10-31 MED ORDER — BISACODYL 10 MG RE SUPP
10.0000 mg | Freq: Every day | RECTAL | Status: DC
  Administered 2021-10-31 – 2021-11-02 (×3): 10 mg via RECTAL
  Filled 2021-10-31 (×3): qty 1

## 2021-10-31 MED ORDER — LEVOTHYROXINE SODIUM 50 MCG PO TABS
50.0000 ug | ORAL_TABLET | Freq: Every day | ORAL | Status: DC
  Administered 2021-10-31 – 2021-11-02 (×3): 50 ug via ORAL
  Filled 2021-10-31 (×3): qty 1

## 2021-10-31 MED ORDER — OXYCODONE HCL 5 MG PO TABS
5.0000 mg | ORAL_TABLET | ORAL | Status: DC | PRN
  Administered 2021-11-01: 06:00:00 5 mg via ORAL
  Filled 2021-10-31: qty 1

## 2021-10-31 MED ORDER — CARVEDILOL 3.125 MG PO TABS
6.2500 mg | ORAL_TABLET | Freq: Two times a day (BID) | ORAL | Status: DC
  Administered 2021-10-31 – 2021-11-02 (×3): 6.25 mg via ORAL
  Filled 2021-10-31 (×4): qty 2

## 2021-10-31 MED ORDER — DEXTROSE 50 % IV SOLN
INTRAVENOUS | Status: AC
  Filled 2021-10-31: qty 50

## 2021-10-31 MED ORDER — ACETAMINOPHEN 650 MG RE SUPP: 650.0000 mg | Freq: Four times a day (QID) | RECTAL | Status: DC | PRN

## 2021-10-31 MED ORDER — ISOSORBIDE MONONITRATE ER 60 MG PO TB24
60.0000 mg | ORAL_TABLET | Freq: Every day | ORAL | Status: DC
  Administered 2021-10-31 – 2021-11-02 (×3): 60 mg via ORAL
  Filled 2021-10-31 (×3): qty 1

## 2021-10-31 MED ORDER — HYDRALAZINE HCL 25 MG PO TABS
50.0000 mg | ORAL_TABLET | Freq: Three times a day (TID) | ORAL | Status: DC
  Administered 2021-10-31 – 2021-11-02 (×4): 50 mg via ORAL
  Filled 2021-10-31 (×6): qty 2

## 2021-10-31 MED ORDER — LORATADINE 10 MG PO TABS
10.0000 mg | ORAL_TABLET | Freq: Every day | ORAL | Status: DC
  Administered 2021-10-31 – 2021-11-02 (×3): 10 mg via ORAL
  Filled 2021-10-31 (×3): qty 1

## 2021-10-31 MED ORDER — POLYSACCHARIDE IRON COMPLEX 150 MG PO CAPS
150.0000 mg | ORAL_CAPSULE | Freq: Two times a day (BID) | ORAL | Status: DC
  Administered 2021-10-31 – 2021-11-02 (×5): 150 mg via ORAL
  Filled 2021-10-31 (×5): qty 1

## 2021-10-31 MED ORDER — ACETAMINOPHEN 325 MG PO TABS
650.0000 mg | ORAL_TABLET | Freq: Four times a day (QID) | ORAL | Status: DC | PRN
  Administered 2021-10-31: 650 mg via ORAL
  Filled 2021-10-31: qty 2

## 2021-10-31 MED ORDER — SALINE SPRAY 0.65 % NA SOLN: 1.0000 | Freq: Two times a day (BID) | NASAL | Status: DC | PRN

## 2021-10-31 MED ORDER — DEXTROSE 50 % IV SOLN
0.5000 | Freq: Once | INTRAVENOUS | Status: AC
  Administered 2021-10-31: 25 mL via INTRAVENOUS

## 2021-10-31 MED ORDER — ONDANSETRON HCL 4 MG PO TABS: 4.0000 mg | ORAL_TABLET | Freq: Four times a day (QID) | ORAL | Status: DC | PRN

## 2021-10-31 MED ORDER — DOCUSATE SODIUM 100 MG PO CAPS
100.0000 mg | ORAL_CAPSULE | Freq: Two times a day (BID) | ORAL | Status: DC
  Administered 2021-10-31 – 2021-11-02 (×5): 100 mg via ORAL
  Filled 2021-10-31 (×5): qty 1

## 2021-10-31 MED ORDER — ATORVASTATIN CALCIUM 10 MG PO TABS
20.0000 mg | ORAL_TABLET | Freq: Every day | ORAL | Status: DC
  Administered 2021-10-31 – 2021-11-01 (×3): 20 mg via ORAL
  Filled 2021-10-31 (×3): qty 2

## 2021-10-31 NOTE — Plan of Care (Signed)

## 2021-10-31 NOTE — H&P (Signed)
TRH H&P    Patient Demographics:    [name], is a 88 y.o. female  MRN: [id number]  DOB - 1934-04-24  Admit Date - 10/30/2021  Referring MD/NP/PA: Sabra Heck  Outpatient Primary MD for the patient is Lovett Calender, PA-C  Patient coming from: Home  Chief complaint- abdominal pain   HPI:    [name]  is a 88 y.o. female, with history of asthma, CHF, colon cancer status post partial colectomy with descending colostomy, complete heart block, COPD, diabetes mellitus type 2, hyperlipidemia, hypertension, hypothyroidism presents ED with a chief complaint of right abdominal pain.  Patient reports that she has had abdominal pain in her right abdomen in the upper and lower quadrants for 2 days.  They are sharp pains.  She is not sure when her last ostomy output was because she does not pay attention to that per her report.  She says her last meal was today.  She reports no change in appetite.  She denies any dysuria, hematuria.  Patient reports that she has felt feverish at home, but does not know any measured temperature.  She reports she did have 1 episode of emesis on the morning of presentation.  She is not sure if there is blood in it because she does not pay attention to that.  Patient is oriented to self, place, and contacts.  She is not oriented to time.  Patient has no O2 requirement at home.  Her oxygen sats did drop down into the 70s.  Patient reports that she does not feel short of breath.  She has not had a cough.  Patient is somnolent and further history is difficult to obtain.  Of note is the patient from [facility].  They report she is generally pretty functional.  She had been on Bactrim for 8 days for UTI.  Today when they checked on her she was weak, fatigued, somnolent, with a distended abdomen.  This is not normal for her so they called EMS and she was found to have an O2 sat in the 70s.  In the  ED Temp 98.6, heart rate 90-121, respiratory 17-28, blood pressure 153/131, satting at 92% on 2 L nasal cannula ABG shows a pH of 7.363, CO2 51, O2 44 confirming hypoxia No leukocytosis White blood cell count 6.8 hemoglobin 11.1, platelets 213 Chemistry panel reveals an AKI with a baseline creatinine around 1.1-1.3, today 2.02 Lactic acid was initially 0.9 and increased to 2.1 Negative respiratory panel Blood cultures pending CT chest abdomen pelvis shows multivessel CAD, stable cardiomegaly, pulmonary hypertension, pulmonary edema, calcification of aortic valve Cholelithiasis, descending colostomy Partial large bowel obstruction and partial small bowel obstruction Patient was made n.p.o. UA pending Rocephin Flagyl initially started for concern for infection LR 150 mils per hour started in the ED but discontinued at admission due to pulmonary edema Admission requested for acute hypoxic respiratory failure and bowel obstruction   Review of systems:    In addition to the HPI above,  Reports fever, No Headache, No changes with Vision or hearing,  No problems swallowing food or Liquids, No Chest pain, Cough or Shortness of Breath, Reports abdominal pain, vomiting No Blood in stool or Urine, No dysuria, No new skin rashes or bruises, No new joints pains-aches,  No new weakness, tingling, numbness in any extremity, No recent weight gain or loss, No polyuria, polydypsia or polyphagia, No significant Mental Stressors.  All other systems reviewed and are negative.    Past History of the following :    Past Medical History:  Diagnosis Date   Arthritis    "comes and goes" (03/01/2015)   Asthma    CHF (congestive heart failure) (HCC)    Colon cancer (HCC)    Colostomy care ([place])    Complete heart block ([place])    COPD (chronic obstructive pulmonary disease) ([place])    Diabetes mellitus without complication ([place])    pt denies this hx on 03/01/2015   HCAP (healthcare-associated  pneumonia) 04/22/2017   History of blood transfusion 03/01/2015; 04/23/2017   "related to anemia,"   Hyperlipidemia    Hypertension    Hypothyroidism    Pulmonary embolism [facility])    Rectal cancer ypTypN0 (0/13) s/p chemoXRT/ APR/colostomy 2006 07/27/2015   [facility]  [place], [place] [address]  [phone]   REPORT OF SURGICAL PATHOLOGY   Case #: [id number]  Patient Name: BETTYMAE, YOTT  PID: [id number]  Pathologist: Janae Bridgeman L. [place], MD  DOB/Age 06-Aug-1934 (Age: 48) Gender: F  Date Taken: 11/03/2004  Date Received: 11/03/2004   FINAL DIAGNOSIS   MICROSCOPIC EXAMINATION AND DIAGNOSIS   1. EXCISION, PERIRECTAL TISS      Past Surgical History:  Procedure Laterality Date   ABDOMINAL HYSTERECTOMY     ABDOMINOPERINEAL PROCTOCOLECTOMY  2006   Neoadj chemoXRT & open APR Dr Dalbert Batman for low rectal cancer   ANKLE FRACTURE SURGERY Left    CARDIAC CATHETERIZATION N/A 03/22/2015   Procedure: Right/Left Heart Cath and Coronary Angiography;  Surgeon: Leonie Man, MD;  Location: [facility];  Service: Cardiovascular;  Laterality: N/A;   CATARACT EXTRACTION W/ INTRAOCULAR LENS  IMPLANT, BILATERAL Bilateral    CHOLECYSTECTOMY     COLOSTOMY  2006   permananet end colostomy after APR resection of low rectal cancer.  Dr Dalbert Batman   DILATION AND CURETTAGE OF UTERUS     FRACTURE SURGERY     ICD IMPLANT  09/23/2012   MDT, implanted for primary prevention by Dr Deno Etienne in [place]  2007   SBO.  Dr Dalbert Batman   PARASTOMAL HERNIA REPAIR  2007   lysis of adhesions   TUBAL LIGATION        Social History:      Social History   Tobacco Use   Smoking status: Never   Smokeless tobacco: Never  Substance Use Topics   Alcohol use: No       Family History :     Family History  Problem Relation Age of Onset   CAD Other    Hypertension Other    Stroke Other    Heart disease Mother        No details   Colon cancer Neg Hx    GI Bleed Neg Hx        Home Medications:   Prior to Admission medications   Medication Sig Start Date End Date Taking? Authorizing Provider  acetaminophen (TYLENOL) 500 MG tablet Take 500-1,000 mg by mouth See admin instructions. Take 2 tablets (1000 mg)  by mouth every morning, may also take 1 tablet (500  mg) every 6 hours as needed for fever 99.5-101 F, minor headache, minor discomfort    [provider]  alum & mag hydroxide-simeth (MAALOX/MYLANTA) 200-200-20 MG/5ML suspension Take 30 mLs by mouth every 6 (six) hours as needed for heartburn or indigestion.    [provider]  aspirin EC 81 MG tablet Take 1 tablet (81 mg total) by mouth every morning. 03/03/15   Rai, Ripudeep K, MD  atorvastatin (LIPITOR) 20 MG tablet Take 20 mg by mouth at bedtime.     [provider]  carboxymethylcellulose 1 % ophthalmic solution Place 1 drop into both eyes 3 (three) times daily.     [provider]  carvedilol (COREG) 6.25 MG tablet Take 6.25 mg by mouth 2 (two) times daily with a meal.     [provider]  cyclobenzaprine (FLEXERIL) 5 MG tablet Take 5 mg by mouth at bedtime.    [provider]  famotidine (PEPCID) 20 MG tablet Take 1 tablet (20 mg total) by mouth daily. 02/08/20   Annita Brod, MD  fluticasone-salmeterol (ADVAIR) 500-50 MCG/ACT AEPB Inhale 1 puff into the lungs in the morning and at bedtime.    [provider]  furosemide (LASIX) 40 MG tablet Take 40 mg by mouth daily.    [provider]  guaifenesin (ROBITUSSIN) 100 MG/5ML syrup Take 200 mg by mouth every 6 (six) hours as needed for cough.    [provider]  hydrALAZINE (APRESOLINE) 50 MG tablet Take 1 tablet (50 mg total) by mouth 3 (three) times daily. 03/15/21   Shelly Coss, MD  iron polysaccharides (NIFEREX) 150 MG capsule Take 150 mg by mouth 2 (two) times daily.    [provider]  isosorbide mononitrate (IMDUR) 60 MG 24 hr tablet Take 1 tablet (60 mg  total) by mouth daily. 02/08/20   Annita Brod, MD  levothyroxine (SYNTHROID, LEVOTHROID) 50 MCG tablet Take 50 mcg by mouth daily at 6 (six) AM. 01/16/15   [provider]  loperamide (IMODIUM) 2 MG capsule Take 2 mg by mouth as needed for diarrhea or loose stools (max 8 doses in 24 hours).    [provider]  loratadine (CLARITIN) 10 MG tablet Take 10 mg by mouth daily.    [provider]  magnesium hydroxide (MILK OF MAGNESIA) 400 MG/5ML suspension Take 30 mLs by mouth at bedtime as needed for mild constipation.    [provider]  meclizine (ANTIVERT) 25 MG tablet Take 25 mg by mouth every 8 (eight) hours as needed for dizziness.    [provider]  Menthol-Methyl Salicylate (BENGAY ARTHRITIS FORMULA EX) Apply 1 application topically 2 (two) times daily as needed (knee pain).    [provider]  Neomycin-Bacitracin-Polymyxin (TRIPLE ANTIBIOTIC) [phone] OINT Apply 1 application topically daily as needed (minor skin tears/abrasians).    [provider]  nystatin (MYCOSTATIN/NYSTOP) powder Apply 1 application topically See admin instructions. Apply to rash around stoma on stoma care days, when replacing bag - on Mondays    [provider]  ondansetron (ZOFRAN) 4 MG tablet Take 1 tablet (4 mg total) by mouth every 6 (six) hours as needed for nausea or vomiting. 6/78/93   Delora Fuel, MD  polyethylene glycol [facility]) packet Take 17 g by mouth daily as needed (constipation). Mix with 8 ounces of fluid and drink    [provider]  sodium chloride (OCEAN) 0.65 %  SOLN nasal spray Place 1 spray into both nostrils every 12 (twelve) hours as needed for congestion.    [provider]  vitamin C (ASCORBIC ACID) 250 MG tablet Take 250 mg by mouth 2 (two) times daily.    [provider]     Allergies:    No Known Allergies   Physical Exam:   Vitals  Blood pressure (!) 156/62, pulse 91,  temperature 99.5 F (37.5 C), temperature source Oral, resp. rate (!) 21, height 5\' 6"  (1.676 m), weight 87.7 kg, SpO2 97 %.   1.  General: Patient lying supine in bed,  no acute distress   2. Psychiatric: Somnolent and oriented x 2, mood and behavior normal for situation, pleasant and cooperative with exam   3. Neurologic: Speech and language are normal, face is symmetric, moves all 4 extremities voluntarily, at baseline without acute deficits on limited exam   4. HEENMT:  Head is atraumatic, normocephalic, pupils reactive to light, neck is supple, trachea is midline, mucous membranes are moist   5. Respiratory : Lungs are clear to auscultation bilaterally without wheezing, rhonchi, rales, no cyanosis, no increase in work of breathing or accessory muscle use   6. Cardiovascular : Heart rate normal, rhythm is regular, no murmurs, rubs or gallops, no peripheral edema, peripheral pulses palpated   7. Gastrointestinal:  Abdomen is soft, distended and tender to palpation diffusely but especially in the right lower quadrant, colostomy in place with little output, no masses or organomegaly palpated   8. Skin:  Skin is warm, dry and intact without rashes, acute lesions, or ulcers on limited exam   9.Musculoskeletal:  No acute deformities or trauma, no asymmetry in tone, no peripheral edema, peripheral pulses palpated, no tenderness to palpation in the extremities     Data Review:    CBC Recent Labs  Lab 10/30/21 1732  WBC 6.8  HGB 11.1*  HCT 36.4  PLT 213  MCV 101.1*  MCH 30.8  MCHC 30.5  RDW 15.9*  LYMPHSABS 0.8  MONOABS 0.6  EOSABS 0.3  BASOSABS 0.0   ------------------------------------------------------------------------------------------------------------------  Results for orders placed or performed during the hospital encounter of 10/30/21 (from the past 48 hour(s))  Lactic acid, plasma     Status: None   Collection Time: 10/30/21  5:32 PM  Result Value Ref  Range   Lactic Acid, Venous 0.9 0.5 - 1.9 mmol/L    Comment: Performed at [facility], [address], [place], [place] [address]  Comprehensive metabolic panel     Status: Abnormal   Collection Time: 10/30/21  5:32 PM  Result Value Ref Range   Sodium 138 135 - 145 mmol/L   Potassium 4.4 3.5 - 5.1 mmol/L   Chloride 101 98 - 111 mmol/L   CO2 28 22 - 32 mmol/L   Glucose, Bld 112 (H) 70 - 99 mg/dL    Comment: Glucose reference range applies only to samples taken after fasting for at least 8 hours.   BUN 31 (H) 8 - 23 mg/dL   Creatinine, Ser 2.02 (H) 0.44 - 1.00 mg/dL   Calcium 9.0 8.9 - 10.3 mg/dL   Total Protein 7.4 6.5 - 8.1 g/dL   Albumin 4.0 3.5 - 5.0 g/dL   AST 18 15 - 41 U/L   ALT 20 0 - 44 U/L   Alkaline Phosphatase 77 38 - 126 U/L   Total Bilirubin 0.4 0.3 - 1.2 mg/dL   GFR, Estimated 24 (L) >60 mL/min    Comment: (  NOTE) Calculated using the CKD-EPI Creatinine Equation (2021)    Anion gap 9 5 - 15    Comment: Performed at [facility], [address] [place], [place] [address]  CBC WITH DIFFERENTIAL     Status: Abnormal   Collection Time: 10/30/21  5:32 PM  Result Value Ref Range   WBC 6.8 4.0 - 10.5 K/uL   RBC 3.60 (L) 3.87 - 5.11 MIL/uL   Hemoglobin 11.1 (L) 12.0 - 15.0 g/dL   HCT 36.4 36.0 - 46.0 %   MCV 101.1 (H) 80.0 - 100.0 fL   MCH 30.8 26.0 - 34.0 pg   MCHC 30.5 30.0 - 36.0 g/dL   RDW 15.9 (H) 11.5 - 15.5 %   Platelets 213 150 - 400 K/uL   nRBC 0.0 0.0 - 0.2 %   Neutrophils Relative % 74 %   Neutro Abs 5.0 1.7 - 7.7 K/uL   Lymphocytes Relative 12 %   Lymphs Abs 0.8 0.7 - 4.0 K/uL   Monocytes Relative 9 %   Monocytes Absolute 0.6 0.1 - 1.0 K/uL   Eosinophils Relative 5 %   Eosinophils Absolute 0.3 0.0 - 0.5 K/uL   Basophils Relative 0 %   Basophils Absolute 0.0 0.0 - 0.1 K/uL   Immature Granulocytes 0 %   Abs Immature Granulocytes 0.03 0.00 - 0.07 K/uL    Comment: Performed at [facility], [address], [place], [place] [address]  Protime-INR      Status: None   Collection Time: 10/30/21  5:32 PM  Result Value Ref Range   Prothrombin Time 14.5 11.4 - 15.2 seconds   INR 1.1 0.8 - 1.2    Comment: (NOTE) INR goal varies based on device and disease states. Performed at [facility], [address], [place], [place] [address]   APTT     Status: None   Collection Time: 10/30/21  5:32 PM  Result Value Ref Range   aPTT 32 24 - 36 seconds    Comment: Performed at [facility], [address], [place], [place] [address]  Blood Culture (routine x 2)     Status: None (Preliminary result)   Collection Time: 10/30/21  5:32 PM   Specimen: BLOOD  Result Value Ref Range   Specimen Description BLOOD REJ BOTTLES DRAWN AEROBIC AND ANAEROBIC    Special Requests      Blood Culture adequate volume Performed at [facility], [address] [place], [place] [address]    Culture PENDING    Report Status PENDING   Blood Culture (routine x 2)     Status: None (Preliminary result)   Collection Time: 10/30/21  5:33 PM   Specimen: BLOOD LEFT FOREARM  Result Value Ref Range   Specimen Description      BLOOD LEFT FOREARM BOTTLES DRAWN AEROBIC AND ANAEROBIC   Special Requests      Blood Culture results may not be optimal due to an inadequate volume of blood received in culture bottles Performed at [facility], [address] [place], [place] [address]    Culture PENDING    Report Status PENDING   Blood gas, arterial     Status: Abnormal   Collection Time: 10/30/21  5:40 PM  Result Value Ref Range   FIO2 21.00    pH, Arterial 7.363 7.350 - 7.450   pCO2 arterial 51.1 (H) 32.0 - 48.0 mmHg   pO2, Arterial 44.4 (L) 83.0 - 108.0 mmHg   Bicarbonate 26.4 20.0 - 28.0 mmol/L   Acid-Base  Excess 3.4 (H) 0.0 - 2.0 mmol/L   O2 Saturation 74.3 %   Patient temperature 36.7    Collection site RIGHT RADIAL    Sample type ARTERIAL    Allens test (pass/fail) PASS PASS    Comment: Performed at [facility], [address] [place], [place] [address]  Resp Panel by  RT-PCR (Flu A&B, Covid) Nasopharyngeal Swab     Status: None   Collection Time: 10/30/21  5:47 PM   Specimen: Nasopharyngeal Swab; Nasopharyngeal(NP) swabs in vial transport medium  Result Value Ref Range   SARS Coronavirus 2 by RT PCR NEGATIVE NEGATIVE    Comment: (NOTE) SARS-CoV-2 target nucleic acids are NOT DETECTED.  The SARS-CoV-2 RNA is generally detectable in upper respiratory specimens during the acute phase of infection. The lowest concentration of SARS-CoV-2 viral copies this assay can detect is 138 copies/mL. A negative result does not preclude SARS-Cov-2 infection and should not be used as the sole basis for treatment or other patient management decisions. A negative result may occur with  improper specimen collection/handling, submission of specimen other than nasopharyngeal swab, presence of viral mutation(s) within the areas targeted by this assay, and inadequate number of viral copies(<138 copies/mL). A negative result must be combined with clinical observations, patient history, and epidemiological information. The expected result is Negative.  Fact Sheet for Patients:  EntrepreneurPulse.com.au  Fact Sheet for Healthcare Providers:  IncredibleEmployment.be  This test is no t yet approved or cleared by the [place] FDA and  has been authorized for detection and/or diagnosis of SARS-CoV-2 by FDA under an Emergency Use Authorization (EUA). This EUA will remain  in effect (meaning this test can be used) for the duration of the COVID-19 declaration under Section 564(b)(1) of the Act, 21 U.S.C.section 360bbb-3(b)(1), unless the authorization is terminated  or revoked sooner.       Influenza A by PCR NEGATIVE NEGATIVE   Influenza B by PCR NEGATIVE NEGATIVE    Comment: (NOTE) The Xpert Xpress SARS-CoV-2/FLU/RSV plus assay is intended as an aid in the diagnosis of influenza from Nasopharyngeal swab specimens and should not be  used as a sole basis for treatment. Nasal washings and aspirates are unacceptable for Xpert Xpress SARS-CoV-2/FLU/RSV testing.  Fact Sheet for Patients: EntrepreneurPulse.com.au  Fact Sheet for Healthcare Providers: IncredibleEmployment.be  This test is not yet approved or cleared by the [place] FDA and has been authorized for detection and/or diagnosis of SARS-CoV-2 by FDA under an Emergency Use Authorization (EUA). This EUA will remain in effect (meaning this test can be used) for the duration of the COVID-19 declaration under Section 564(b)(1) of the Act, 21 U.S.C. section 360bbb-3(b)(1), unless the authorization is terminated or revoked.  Performed at [facility], [address], [place], [place] [address]   Lactic acid, plasma     Status: Abnormal   Collection Time: 10/30/21  7:35 PM  Result Value Ref Range   Lactic Acid, Venous 2.1 (HH) 0.5 - 1.9 mmol/L    Comment: CRITICAL RESULT CALLED TO, READ BACK BY AND VERIFIED WITH: TEASLEY,B AT 2007 ON 1.22.23 BY RUCINSKI,B Performed at [facility], [address] [place], [place] [address]   Lactic acid, plasma     Status: None   Collection Time: 10/30/21 11:14 PM  Result Value Ref Range   Lactic Acid, Venous 1.3 0.5 - 1.9 mmol/L    Comment: Performed at [facility], [address], [place], [place] [address]    Chemistries  Recent Labs  Lab 10/30/21 [phone]  NA 138  K 4.4  CL 101  CO2 28  GLUCOSE 112*  BUN 31*  CREATININE 2.02*  CALCIUM 9.0  AST 18  ALT 20  ALKPHOS 77  BILITOT 0.4   ------------------------------------------------------------------------------------------------------------------  ------------------------------------------------------------------------------------------------------------------ GFR: Estimated Creatinine Clearance: 22.7 mL/min (A) (by C-G formula based on SCr of 2.02 mg/dL (H)). Liver Function Tests: Recent Labs  Lab 10/30/21 1732  AST 18   ALT 20  ALKPHOS 77  BILITOT 0.4  PROT 7.4  ALBUMIN 4.0   No results for input(s): LIPASE, AMYLASE in the last 168 hours. No results for input(s): AMMONIA in the last 168 hours. Coagulation Profile: Recent Labs  Lab 10/30/21 1732  INR 1.1   Cardiac Enzymes: No results for input(s): CKTOTAL, CKMB, CKMBINDEX, TROPONINI in the last 168 hours. BNP (last 3 results) No results for input(s): PROBNP in the last 8760 hours. HbA1C: No results for input(s): HGBA1C in the last 72 hours. CBG: No results for input(s): GLUCAP in the last 168 hours. Lipid Profile: No results for input(s): CHOL, HDL, LDLCALC, TRIG, CHOLHDL, LDLDIRECT in the last 72 hours. Thyroid Function Tests: No results for input(s): TSH, T4TOTAL, FREET4, T3FREE, THYROIDAB in the last 72 hours. Anemia Panel: No results for input(s): VITAMINB12, FOLATE, FERRITIN, TIBC, IRON, RETICCTPCT in the last 72 hours.  --------------------------------------------------------------------------------------------------------------- Urine analysis:    Component Value Date/Time   COLORURINE YELLOW 03/13/2021 2105   APPEARANCEUR HAZY (A) 03/13/2021 2105   LABSPEC 1.015 03/13/2021 2105   PHURINE 5.0 03/13/2021 2105   GLUCOSEU NEGATIVE 03/13/2021 2105   HGBUR NEGATIVE 03/13/2021 2105   BILIRUBINUR NEGATIVE 03/13/2021 2105   KETONESUR NEGATIVE 03/13/2021 2105   PROTEINUR 100 (A) 03/13/2021 2105   UROBILINOGEN 0.2 07/23/2015 1610   NITRITE NEGATIVE 03/13/2021 2105   LEUKOCYTESUR NEGATIVE 03/13/2021 2105      Imaging Results:    CT ABDOMEN PELVIS WO CONTRAST  Result Date: 10/30/2021 CLINICAL DATA:  Abdominal pain, acute, nonlocalized; Chest pain, nonspecific SOB, hypoxia. Unspecified abdominal pain, generalized weakness. EXAM: CT CHEST, ABDOMEN AND PELVIS WITHOUT CONTRAST TECHNIQUE: Multidetector CT imaging of the chest, abdomen and pelvis was performed following the standard protocol without IV contrast. RADIATION DOSE REDUCTION:  This exam was performed according to the departmental dose-optimization program which includes automated exposure control, adjustment of the mA and/or kV according to patient size and/or use of iterative reconstruction technique. COMPARISON:  04/22/2017 FINDINGS: CT CHEST FINDINGS Cardiovascular: Moderate multi-vessel coronary artery calcification, predominantly within the left anterior descending and left main coronary arteries. Extensive calcification of the mitral valve annulus. Calcification of the aortic valve leaflets noted. Mild-to-moderate global cardiomegaly appears stable since prior examination. Left subclavian dual lead pacemaker leads noted within the right atrium and right ventricle. No pericardial effusion. Central pulmonary arteries are enlarged in keeping with changes of pulmonary arterial hypertension. The thoracic aorta demonstrates moderate atherosclerotic calcification. Bovine arch anatomy noted. The ascending aorta is dilated measuring 4.2 cm in greatest dimension (coronal image # 84/5, stable since prior examination. The descending thoracic aorta is of normal caliber. Mediastinum/Nodes: Asymmetric enlargement of the right thyroid gland is unchanged, likely anatomic variation given its stability over time. No pathologic thoracic adenopathy. The esophagus is unremarkable. Small hiatal hernia. Lungs/Pleura: The pulmonary venous vasculature is engorged centrally and there is subtle diffuse parenchymal ground-glass opacity demonstrating a basilar gradient which is suspicious for mild alveolar pulmonary edema, likely cardiogenic in nature. No pneumothorax or pleural effusion. Musculoskeletal: The osseous structures are diffusely osteopenic. No acute bone abnormality. No lytic or blastic  bone lesion. CT ABDOMEN PELVIS FINDINGS Hepatobiliary: Cholelithiasis without pericholecystic inflammatory change noted. The liver is unremarkable. No intra or extrahepatic biliary ductal dilation. Pancreas:  Unremarkable Spleen: Unremarkable Adrenals/Urinary Tract: The adrenal glands are unremarkable. The kidneys are normal in size and position. Simple cortical cyst noted within the lower pole the right kidney. Probable vascular calcifications within the renal hila bilaterally. The kidneys are otherwise unremarkable. The bladder is unremarkable. Stomach/Bowel: Surgical changes of abdominal peroneal resection and left lower quadrant descending colostomy are identified. There is moderate stool seen throughout the colon with a rapid transition in the caliber of the colon at the stomal hiatus through the abdominal fascia, best seen on axial image # 88/2, suggesting a partial large bowel obstruction. There is a parastomal hernia associated with a single loop of mid jejunum within the hernia sac. This results in a mild partial small bowel obstruction with mildly dilated loops of small bowel upstream from the hernia though gas and fluid is seen within the more distal small bowel. The appendix is not visualized and is likely absent. No free intraperitoneal gas or fluid. Vascular/Lymphatic: Aortic atherosclerosis. No enlarged abdominal or pelvic lymph nodes. Reproductive: Status post hysterectomy. No adnexal masses. Other: None Musculoskeletal: Stable remote superior endplate fracture of L1. Mild interval compression deformity of L5, likely remote in nature without evidence of retropulsion. The osseous structures are diffusely osteopenic. No acute bone abnormality identified. IMPRESSION: Moderate multi-vessel coronary artery calcification. Stable cardiomegaly. Morphologic changes in keeping with pulmonary arterial hypertension. Mild diffuse, basilar predominant parenchymal ground-glass opacity suspicious for mild alveolar pulmonary edema, likely cardiogenic in nature. Calcification of the aortic valve leaflets. Echocardiography may be helpful to assess the degree of valvular dysfunction. Mild dilation of the ascending aorta with  maximal diameter of 4.2 cm, stable since prior examination of 04/22/2017. Recommend annual imaging followup by CTA or MRA. This recommendation follows 2010 ACCF/AHA/AATS/ACR/ASA/SCA/SCAI/SIR/STS/SVM Guidelines for the Diagnosis and Management of Patients with Thoracic Aortic Disease. Circulation. 2010; 121: Y706-C376. Aortic aneurysm NOS (ICD10-I71.9) Cholelithiasis. Surgical changes of abdominal peroneal resection and descending colostomy. Moderate retained stool throughout the colon and rapid transition at the abdominal fascial hiatus suggests a partial large bowel obstruction. Superimposed parastomal hernia containing a single loop of small bowel with mild dilation of the upstream small bowel in keeping with a mild partial small bowel obstruction. Osteopenia. Interval compression deformity of L5, likely remote in nature. Stable compression deformity of L1. No retropulsion. Aortic aneurysm NOS (ICD10-I71.9). Aortic Atherosclerosis (ICD10-I70.0). Electronically Signed   By: Fidela Salisbury M.D.   On: 10/30/2021 20:29   CT Chest Wo Contrast  Result Date: 10/30/2021 CLINICAL DATA:  Abdominal pain, acute, nonlocalized; Chest pain, nonspecific SOB, hypoxia. Unspecified abdominal pain, generalized weakness. EXAM: CT CHEST, ABDOMEN AND PELVIS WITHOUT CONTRAST TECHNIQUE: Multidetector CT imaging of the chest, abdomen and pelvis was performed following the standard protocol without IV contrast. RADIATION DOSE REDUCTION: This exam was performed according to the departmental dose-optimization program which includes automated exposure control, adjustment of the mA and/or kV according to patient size and/or use of iterative reconstruction technique. COMPARISON:  04/22/2017 FINDINGS: CT CHEST FINDINGS Cardiovascular: Moderate multi-vessel coronary artery calcification, predominantly within the left anterior descending and left main coronary arteries. Extensive calcification of the mitral valve annulus. Calcification of the  aortic valve leaflets noted. Mild-to-moderate global cardiomegaly appears stable since prior examination. Left subclavian dual lead pacemaker leads noted within the right atrium and right ventricle. No pericardial effusion. Central pulmonary arteries are enlarged in keeping with changes  of pulmonary arterial hypertension. The thoracic aorta demonstrates moderate atherosclerotic calcification. Bovine arch anatomy noted. The ascending aorta is dilated measuring 4.2 cm in greatest dimension (coronal image # 84/5, stable since prior examination. The descending thoracic aorta is of normal caliber. Mediastinum/Nodes: Asymmetric enlargement of the right thyroid gland is unchanged, likely anatomic variation given its stability over time. No pathologic thoracic adenopathy. The esophagus is unremarkable. Small hiatal hernia. Lungs/Pleura: The pulmonary venous vasculature is engorged centrally and there is subtle diffuse parenchymal ground-glass opacity demonstrating a basilar gradient which is suspicious for mild alveolar pulmonary edema, likely cardiogenic in nature. No pneumothorax or pleural effusion. Musculoskeletal: The osseous structures are diffusely osteopenic. No acute bone abnormality. No lytic or blastic bone lesion. CT ABDOMEN PELVIS FINDINGS Hepatobiliary: Cholelithiasis without pericholecystic inflammatory change noted. The liver is unremarkable. No intra or extrahepatic biliary ductal dilation. Pancreas: Unremarkable Spleen: Unremarkable Adrenals/Urinary Tract: The adrenal glands are unremarkable. The kidneys are normal in size and position. Simple cortical cyst noted within the lower pole the right kidney. Probable vascular calcifications within the renal hila bilaterally. The kidneys are otherwise unremarkable. The bladder is unremarkable. Stomach/Bowel: Surgical changes of abdominal peroneal resection and left lower quadrant descending colostomy are identified. There is moderate stool seen throughout the  colon with a rapid transition in the caliber of the colon at the stomal hiatus through the abdominal fascia, best seen on axial image # 88/2, suggesting a partial large bowel obstruction. There is a parastomal hernia associated with a single loop of mid jejunum within the hernia sac. This results in a mild partial small bowel obstruction with mildly dilated loops of small bowel upstream from the hernia though gas and fluid is seen within the more distal small bowel. The appendix is not visualized and is likely absent. No free intraperitoneal gas or fluid. Vascular/Lymphatic: Aortic atherosclerosis. No enlarged abdominal or pelvic lymph nodes. Reproductive: Status post hysterectomy. No adnexal masses. Other: None Musculoskeletal: Stable remote superior endplate fracture of L1. Mild interval compression deformity of L5, likely remote in nature without evidence of retropulsion. The osseous structures are diffusely osteopenic. No acute bone abnormality identified. IMPRESSION: Moderate multi-vessel coronary artery calcification. Stable cardiomegaly. Morphologic changes in keeping with pulmonary arterial hypertension. Mild diffuse, basilar predominant parenchymal ground-glass opacity suspicious for mild alveolar pulmonary edema, likely cardiogenic in nature. Calcification of the aortic valve leaflets. Echocardiography may be helpful to assess the degree of valvular dysfunction. Mild dilation of the ascending aorta with maximal diameter of 4.2 cm, stable since prior examination of 04/22/2017. Recommend annual imaging followup by CTA or MRA. This recommendation follows 2010 ACCF/AHA/AATS/ACR/ASA/SCA/SCAI/SIR/STS/SVM Guidelines for the Diagnosis and Management of Patients with Thoracic Aortic Disease. Circulation. 2010; 121: Q330-Q762. Aortic aneurysm NOS (ICD10-I71.9) Cholelithiasis. Surgical changes of abdominal peroneal resection and descending colostomy. Moderate retained stool throughout the colon and rapid transition  at the abdominal fascial hiatus suggests a partial large bowel obstruction. Superimposed parastomal hernia containing a single loop of small bowel with mild dilation of the upstream small bowel in keeping with a mild partial small bowel obstruction. Osteopenia. Interval compression deformity of L5, likely remote in nature. Stable compression deformity of L1. No retropulsion. Aortic aneurysm NOS (ICD10-I71.9). Aortic Atherosclerosis (ICD10-I70.0). Electronically Signed   By: Fidela Salisbury M.D.   On: 10/30/2021 20:29   DG Chest Port 1 View  Result Date: 10/30/2021 CLINICAL DATA:  Shortness of breath.  Abdominal pain and weakness. EXAM: PORTABLE CHEST 1 VIEW COMPARISON:  02/06/2020 FINDINGS: AICD noted. Low lung volumes are present,  causing crowding of the pulmonary vasculature. Mild to moderate cardiomegaly. Indistinct left hemidiaphragm, this has been present on prior exams and accordingly may be configuration ule but I cannot exclude a left lower lobe airspace opacity. Atherosclerotic calcification of the aortic arch. IMPRESSION: 1. Low lung volumes are present, causing crowding of the pulmonary vasculature. 2.  Aortic Atherosclerosis (ICD10-I70.0). 3. 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 Part of the left lung base is also covered by the patient's AICD pulse generator. Electronically Signed   By: Van Clines M.D.   On: 10/30/2021 17:18    My personal review of EKG: Sinus rhythm, QTC 512, conduction delay repolarization abnormality   Assessment & Plan:    Principal Problem:   SBO (small bowel obstruction) (HCC) Active Problems:   Acute CHF (congestive heart failure) (HCC)   Acute respiratory failure (HCC)   Hypothyroidism   Acute renal failure superimposed on stage 3a chronic  kidney disease (HCC)   Small and large bowel obstructions N.p.o. Dr.Jenkins was consulted from the ED and recommended enema-ordered by the ED No NG was recommended at this time Pain control GEN surge to see in the a.m. Acute CHF Acute congestive heart failure exacerbation Major Criteria   Cardiomegaly, pulm edema on chest xray Minor criteria BL leg edema, dyspnea on exertion, Hr 121 IV diuresis initiated with 40 mg IV Lasix continue 40 mg IV Lasix twice daily Last Echo last echo shows grade 3 diastolic dysfunction with an ejection fraction of 40 to 45%, update echo Daily weights, fluid restriction, strict Is and Os Continue statin, Coreg, aspirin, Imdur for medical optimization  Acute respiratory failure with hypoxia Oxygen sats in the 70s ABG shows an O2 of 44 confirming hypoxia Is likely secondary to pulmonary edema as seen on imaging Patient was covered with Rocephin for possible infection, but less likely at this point-procalcitonin pending Normalized on 2 L nasal cannula Wean off O2 as tolerated Treat CHF as above Hypothyroidism Continue Synthroid Check TSH in a.m. AKI Creatinine baseline 1.3ish  Today creatinine 2.02 Possibly related to cardiorenal syndrome given pulmonary edema and cardiomegaly on imaging Hold nephrotoxic agents, hold fluids See plan above for CHF Recheck in a.m.    DVT Prophylaxis-   Heparin - SCDs   AM Labs Ordered, also please review Full Orders  Family Communication: No family at bedside code Status: DNR  Admission status: Inpatient :The appropriate admission status for this patient is INPATIENT. Inpatient status is judged to be reasonable and necessary in order to provide the required intensity of service to ensure the patient's safety. The patient's presenting symptoms, physical exam findings, and initial radiographic and laboratory data in the context of their chronic comorbidities is felt to place them at high risk for further clinical  deterioration. Furthermore, it is not anticipated that the patient will be medically stable for discharge from the hospital within 2 midnights of admission. The following factors support the admission status of inpatient.     The patient's presenting symptoms include dyspnea and abdominal pain. The worrisome physical exam findings include distended abdomen, tachycardia, somnolence. The initial radiographic and laboratory data are worrisome because of large and small bowel obstruction-partial. The chronic co-morbidities include CHF, COPD, hypothyroidism.       * I certify that at the point of admission it is  my clinical judgment that the patient will require inpatient hospital care spanning beyond 2 midnights from the point of admission due to high intensity of service, high risk for further deterioration and high frequency of surveillance required.*  Disposition: Anticipated Discharge date 48 to 72 hours discharge to [facility]  Time spent in minutes : [place]

## 2021-10-31 NOTE — Progress Notes (Signed)
Checked O2 on room air and dropped to mid 80's.  97% on 2 LIters

## 2021-10-31 NOTE — Care Plan (Signed)
This 88 years old female with PMH significant for asthma, CHF, colon cancer s/p partial colectomy with descending colostomy, complete heart block, COPD, diabetes mellitus type 2, hyperlipidemia, hypertension, hypothyroidism presented to the ED with c/o: right abdominal pain for 2 days.  She describes pain as sharp associated with the vomiting.  She was hypoxic on arrival with SPO2 70% on room air,  placed on 2 L of supplemental oxygen.  Patient was somewhat somnolent and further history was obtained from ED chart.  Labs in the ED showed serum creatinine 2.02 consistent with AKI.  CT chest abdomen and pelvis showed multivessel CAD, cholelithiasis, descending colostomy, partial large bowel obstruction and partial small bowel obstruction.  Patient was kept NPO., admitted for partial  small bowel obstruction.  General surgery was consulted,  Patient was found to have stool and gas in the colostomy bag, recommended clear liquid diet.  Consider NG tube if patient continues to have vomiting. Patient also been found to have acute CHF exacerbation requiring IV diuresis.  Last echocardiogram shows LVEF 40 to 45%.  Continue home medications for medical optimization.

## 2021-10-31 NOTE — TOC Initial Note (Signed)
Transition of Care [facility]) - Initial/Assessment Note    Patient Details  Name: [name] MRN: [id number] Date of Birth: 06-Oct-1934  Transition of [facility]) CM/SW Contact:    Shade Flood, LCSW Phone Number: 10/31/2021, 2:04 PM  Clinical Narrative:                  Pt admitted from [place]. Anticipating pt will dc back to [facility] when stable. Spoke with Acyillia at [facility] to update. Per Acyillia, they will be able to accept pt at dc. Pt receives minimal assistance with ADLs at the ALF. She uses a rollator for ambulation. She has not been on O2 there but they can accommodate pt with O2 at dc if needed.   Pt will transport with ALF staff or [place] w/c van at [facility]. MD anticipating dc in 2-3 days. TOC will follow.  Expected Discharge Plan: Assisted Living Barriers to Discharge: Continued Medical Work up   Patient Goals and CMS Choice Patient states their goals for this hospitalization and ongoing recovery are:: get better      Expected Discharge Plan and Services Expected Discharge Plan: Assisted Living In-house Referral: Clinical Social Work     Living arrangements for the past 2 months: [place]                                      Prior Living Arrangements/Services Living arrangements for the past 2 months: [place] Lives with:: Facility Resident Patient language and need for interpreter reviewed:: Yes Do you feel safe going back to the place where you live?: Yes      Need for Family Participation in Patient Care: No (Comment) Care giver support system in place?: Yes (comment)   Criminal Activity/Legal Involvement Pertinent to Current Situation/Hospitalization: No - Comment as needed  Activities of Daily Living Home Assistive Devices/Equipment: Oxygen, Walker (specify type) ADL Screening (condition at time of admission) Patient's cognitive ability adequate to safely complete daily activities?: Yes Is the  patient deaf or have difficulty hearing?: No Does the patient have difficulty seeing, even when wearing glasses/contacts?: No Does the patient have difficulty concentrating, remembering, or making decisions?: No Patient able to express need for assistance with ADLs?: Yes Does the patient have difficulty dressing or bathing?: No Independently performs ADLs?: No Communication: Needs assistance Is this a change from baseline?: Change from baseline, expected to last <3 days Dressing (OT): Needs assistance Is this a change from baseline?: Change from baseline, expected to last <3days Grooming: Needs assistance Is this a change from baseline?: Change from baseline, expected to last <3 days Feeding: Independent Bathing: Needs assistance Is this a change from baseline?: Change from baseline, expected to last <3 days Toileting: Needs assistance Is this a change from baseline?: Pre-admission baseline In/Out Bed: Needs assistance Is this a change from baseline?: Change from baseline, expected to last <3 days Walks in Home: Needs assistance Is this a change from baseline?: Change from baseline, expected to last <3 days Does the patient have difficulty walking or climbing stairs?: Yes Weakness of Legs: Both Weakness of Arms/Hands: None  Permission Sought/Granted                  Emotional Assessment       Orientation: : Oriented to Self, Oriented to Place, Oriented to  Time, Oriented to Situation Alcohol / Substance Use: Not Applicable Psych  Involvement: No (comment)  Admission diagnosis:  Abdominal distention [R14.0] SBO (small bowel obstruction) ([place]) [K56.609] Hypoxia [R09.02] AKI (acute kidney injury) ([place]) [N17.9] Patient Active Problem List   Diagnosis Date Noted   Acute renal failure superimposed on stage 3a chronic kidney disease ([place]) 03/13/2021   Vertigo 03/13/2021   Chronic combined systolic and diastolic CHF (congestive heart failure) ([place]) 04/18/2019   Personal  history of gout 08/28/2018   Recurrent parastomal hernia at [facility]Q colostomy 08/21/2018   SBO (small bowel obstruction) ([place]) 08/21/2018   Chest pain 10/28/2017   Aspiration of liquid 10/28/2017   Bilateral lower extremity edema    Pneumonia 04/22/2017   Altered mental status    Hypotension 02/16/2017   Syncope 02/02/2016   Faintness    Hypothyroidism 11/16/2015   Acute on chronic combined systolic and diastolic CHF  62/22/9798   Hypertensive heart disease 10/04/2015   Elevated troponin 10/04/2015   Acute CHF (congestive heart failure) ([place]) 10/01/2015   Acute respiratory failure (HCC)    NICM (nonischemic cardiomyopathy) ([place]) 08/11/2015   Normal coronary arteries 08/11/2015   Dyslipidemia 07/27/2015   CKD (chronic kidney disease) stage 3, GFR 30-59 ml/min 07/27/2015   Anemia 07/27/2015   FUO (fever of unknown origin) 07/27/2015   IDA (iron deficiency anemia) 07/27/2015   Rectal cancer ypTypN0 (0/13) s/p chemoXRT/ APR/colostomy 2006 07/27/2015   Sepsis ([place]) 07/24/2015   Acute encephalopathy 07/24/2015   Hypokalemia 07/24/2015   Dementia ([place]) 07/23/2015   COPD (chronic obstructive pulmonary disease) ([place]) 04/25/2015   ICD in place 04/14/2015   Colostomy in place [facility]) 03/01/2015   Pacemaker 11/07/2012   Anxiety 10/04/2011   PCP:  Lovett Calender, PA-C Pharmacy:  No Pharmacies Listed    Social Determinants of Health (SDOH) Interventions    Readmission Risk Interventions Readmission Risk Prevention Plan 04/24/2019 04/21/2019  Transportation Screening - Complete  HRI or [place] - Complete  Social Work Consult for [place] Planning/Counseling - Complete  Palliative Care Screening Not Applicable -  Some recent data might be hidden

## 2021-10-31 NOTE — Progress Notes (Signed)
Has been sleepy today but will answer questions and make short statements.  Speech therapy evaluated patient because she holds pills and liquids in her mouth before swallowing No choking noted when swallowing.  Colace was given and suppository given in ostomy.  Blood pressure was 108/72 and pulse 103 at 1630 so contacted Dr. Dwyane Dee and he said to hold bp meds that were due at 1700

## 2021-10-31 NOTE — Evaluation (Signed)
Clinical/Bedside Swallow Evaluation Patient Details  Name: [name] MRN: [id number] Date of Birth: 10/13/1933  Today's Date: 10/31/2021 Time: SLP Start Time (ACUTE ONLY): 1749 SLP Stop Time (ACUTE ONLY): 4496 SLP Time Calculation (min) (ACUTE ONLY): 22 min  Past Medical History:  Past Medical History:  Diagnosis Date   Arthritis    "comes and goes" (03/01/2015)   Asthma    CHF (congestive heart failure) ([place])    Colon cancer ([place])    Colostomy care ([place])    Complete heart block ([place])    COPD (chronic obstructive pulmonary disease) ([place])    Diabetes mellitus without complication ([place])    pt denies this hx on 03/01/2015   HCAP (healthcare-associated pneumonia) 04/22/2017   History of blood transfusion 03/01/2015; 04/23/2017   "related to anemia,"   Hyperlipidemia    Hypertension    Hypothyroidism    Pulmonary embolism [facility])    Rectal cancer ypTypN0 (0/13) s/p chemoXRT/ APR/colostomy 2006 07/27/2015   [facility]  [place], [place] [address]  [phone]   REPORT OF SURGICAL PATHOLOGY   Case #: [id number]  Patient Name: [name]  PID: [id number]  Pathologist: Janae Bridgeman L. [place] Circle, MD  DOB/Age 08-Oct-1934 (Age: 11) Gender: F  Date Taken: 11/03/2004  Date Received: 11/03/2004   FINAL DIAGNOSIS   MICROSCOPIC EXAMINATION AND DIAGNOSIS   1. EXCISION, PERIRECTAL TISS   Past Surgical History:  Past Surgical History:  Procedure Laterality Date   ABDOMINAL HYSTERECTOMY     ABDOMINOPERINEAL PROCTOCOLECTOMY  2006   [facility]oadj chemoXRT & open APR Dr Dalbert Batman for low rectal cancer   ANKLE FRACTURE SURGERY Left    CARDIAC CATHETERIZATION N/A 03/22/2015   Procedure: Right/Left Heart Cath and Coronary Angiography;  Surgeon: Leonie Man, MD;  Location: [facility];  Service: Cardiovascular;  Laterality: N/A;   CATARACT EXTRACTION W/ INTRAOCULAR LENS  IMPLANT, BILATERAL Bilateral    CHOLECYSTECTOMY     COLOSTOMY  2006   permananet end colostomy after APR  resection of low rectal cancer.  Dr Dalbert Batman   DILATION AND CURETTAGE OF UTERUS     FRACTURE SURGERY     ICD IMPLANT  09/23/2012   MDT, implanted for primary prevention by Dr Deno Etienne in [place]  2007   SBO.  Dr Lasandra Beech HERNIA REPAIR  2007   lysis of adhesions   TUBAL LIGATION     HPI:  [name]  is a 88 y.o. female, with history of asthma, CHF, colon cancer status post partial colectomy with descending colostomy, complete heart block, COPD, diabetes mellitus type 2, hyperlipidemia, hypertension, hypothyroidism presents ED with a chief complaint of right abdominal pain.  Patient reports that she has had abdominal pain in her right abdomen in the upper and lower quadrants for 2 days.  They are sharp pains.  She is not sure when her last ostomy output was because she does not pay attention to that per her report.  She says her last meal was today.  She reports no change in appetite.  She denies any dysuria, hematuria.  Patient reports that she has felt feverish at home, but does not know any measured temperature.  She reports she did have 1 episode of emesis on the morning of presentation.  She is not sure if there is blood in it because she does not pay attention to that.  Patient is oriented to self, place, and contacts.  She is not oriented to time.  Patient has no O2 requirement at home.  Her oxygen sats did drop down into the 70s.  Patient reports that she does not feel short of breath.  She has not had a cough.  Patient is somnolent and further history is difficult to obtain.Admission requested for acute hypoxic respiratory failure and bowel obstruction. BSE requested.    Assessment / Plan / Recommendation  Clinical Impression  Clinical swallow evaluation completed, however intake was limited due to Pt with poor appetite and also on clear liquids at this time. Oral motor examination is WNL and Pt with upper dentures. She exhibited mild oral holding with ice  chips, water, and puree, however no overt signs or symptoms of aspiration. This appears consistent with previous MBSS and clinical swallow evaluations in the past. Pt is on clear liquids per surgeon at this time. SLP will follow for upgrades when she is medically cleared. SLP Visit Diagnosis: Dysphagia, unspecified (R13.10)    Aspiration Risk  No limitations    Diet Recommendation Dysphagia 1 (Puree);Thin liquid (per GI on clear liquids)   Liquid Administration via: Cup;Straw Medication Administration: Whole meds with liquid Supervision: Patient able to self feed;Intermittent supervision to cue for compensatory strategies Compensations: Small sips/bites Postural Changes: Seated upright at 90 degrees;Remain upright for at least 30 minutes after po intake    Other  Recommendations Oral Care Recommendations: Staff/trained caregiver to provide oral care;Oral care BID Other Recommendations: Clarify dietary restrictions    Recommendations for follow up therapy are one component of a multi-disciplinary discharge planning process, led by the attending physician.  Recommendations may be updated based on patient status, additional functional criteria and insurance authorization.  Follow up Recommendations  (pending)      Assistance Recommended at Discharge Intermittent Supervision/Assistance  Functional Status Assessment Patient has had a recent decline in their functional status and demonstrates the ability to make significant improvements in function in a reasonable and predictable amount of time.  Frequency and Duration min 2x/week  1 week       Prognosis Prognosis for Safe Diet Advancement: Good      Swallow Study   General Date of Onset: 10/30/21 HPI: [name]  is a 88 y.o. female, with history of asthma, CHF, colon cancer status post partial colectomy with descending colostomy, complete heart block, COPD, diabetes mellitus type 2, hyperlipidemia, hypertension, hypothyroidism  presents ED with a chief complaint of right abdominal pain.  Patient reports that she has had abdominal pain in her right abdomen in the upper and lower quadrants for 2 days.  They are sharp pains.  She is not sure when her last ostomy output was because she does not pay attention to that per her report.  She says her last meal was today.  She reports no change in appetite.  She denies any dysuria, hematuria.  Patient reports that she has felt feverish at home, but does not know any measured temperature.  She reports she did have 1 episode of emesis on the morning of presentation.  She is not sure if there is blood in it because she does not pay attention to that.  Patient is oriented to self, place, and contacts.  She is not oriented to time.  Patient has no O2 requirement at home.  Her oxygen sats did drop down into the 70s.  Patient reports that she does not feel short of breath.  She has not had a cough.  Patient is somnolent and further  history is difficult to obtain.Admission requested for acute hypoxic respiratory failure and bowel obstruction. BSE requested. Type of Study: Bedside Swallow Evaluation Previous Swallow Assessment: MBSS 2019 D3/thin Diet Prior to this Study:  (clear liquids per GI) Temperature Spikes Noted: N/A Respiratory Status: Nasal cannula History of Recent Intubation: No Behavior/Cognition: Alert;Cooperative;Pleasant mood Oral Care Completed by SLP: No Oral Cavity - Dentition: Dentures, top;Edentulous Vision: Functional for self-feeding Self-Feeding Abilities: Needs assist Patient Positioning: Upright in bed Baseline Vocal Quality: Normal;Low vocal intensity Volitional Cough: Strong Volitional Swallow: Able to elicit    Oral/Motor/Sensory Function Overall Oral Motor/Sensory Function: Within functional limits   Ice Chips Ice chips: Within functional limits Presentation: Spoon   Thin Liquid Thin Liquid: Within functional limits Presentation: Cup;Straw    Nectar Thick  Nectar Thick Liquid: Not tested   Honey Thick Honey Thick Liquid: Not tested   Puree Puree: Within functional limits Presentation: Spoon   Solid     Solid: Not tested Other Comments:  (Pt masticated, but unable to assess solids due to Pt on clear liquids)     Thank you,  Genene Churn, [place]  Mark Benecke 10/31/2021,4:18 PM

## 2021-10-31 NOTE — Consult Note (Signed)
[facility] Consult  Reason for Consult: partial SBO, partial LBO Referring Physician: [place], DO   HPI: [name] is a 88 y.o. female who presented to the hospital with right-sided abdominal pain.  Patient currently denies abdominal pain, nausea, and vomiting.  Her ostomy appliance was full of gas and some liquid and hard stool.  She does confirm that she had a history of rectal cancer, which is why she has the ostomy, and she does confirm a history of small bowel obstructions in the past.  She underwent APR for a low rectal cancer in 2006, and has subsequently undergone revision of her colostomy with parastomal hernia repair and lysis of adhesions for SBO in 2007.  She has also undergone cholecystectomy and hysterectomy.  History was difficult to obtain from patient, so remainder of history was obtained from the EMR.  Per the H&P, she complained of a 2-day history of right upper quadrant abdominal pain and bilateral lower quadrant abdominal pain.  She described the pain as sharp.  She was unsure of the last time she had ostomy output.  She confirms an episode of emesis yesterday morning.  She currently denies nausea and vomiting.  She came from her group home. In the emergency department, she was started on IV fluids, and her oxygen requirement was noted to increase.  Her BNP was noted to be 1187, and her high-sensitivity troponins are also elevated.  CT scan of the abdomen and pelvis was demonstrating a large stool burden throughout the colon with a transition at the fascia, which was read as a concern for partial large bowel obstruction; a parastomal hernia was also identified with a single loop of small bowel with mild dilation, read as a mild partial small bowel obstruction.    Past Medical History:  Diagnosis Date   Arthritis    "comes and goes" (03/01/2015)   Asthma    CHF (congestive heart failure) (HCC)    Colon cancer (HCC)    Colostomy care ([place])     Complete heart block ([place])    COPD (chronic obstructive pulmonary disease) ([place])    Diabetes mellitus without complication ([place])    pt denies this hx on 03/01/2015   HCAP (healthcare-associated pneumonia) 04/22/2017   History of blood transfusion 03/01/2015; 04/23/2017   "related to anemia,"   Hyperlipidemia    Hypertension    Hypothyroidism    Pulmonary embolism [facility])    Rectal cancer ypTypN0 (0/13) s/p chemoXRT/ APR/colostomy 2006 07/27/2015   [facility]  [place], [place] [address]  [phone]   REPORT OF SURGICAL PATHOLOGY   Case #: [id number]  Patient Name: [name], [name]  PID: [id number]  Pathologist: Janae Bridgeman L. [place], MD  DOB/Age September 17, 1934 (Age: 60) Gender: F  Date Taken: 11/03/2004  Date Received: 11/03/2004   FINAL DIAGNOSIS   MICROSCOPIC EXAMINATION AND DIAGNOSIS   1. EXCISION, PERIRECTAL TISS    Past Surgical History:  Procedure Laterality Date   ABDOMINAL HYSTERECTOMY     ABDOMINOPERINEAL PROCTOCOLECTOMY  2006   Neoadj chemoXRT & open APR Dr Dalbert Batman for low rectal cancer   ANKLE FRACTURE SURGERY Left    CARDIAC CATHETERIZATION N/A 03/22/2015   Procedure: Right/Left Heart Cath and Coronary Angiography;  Surgeon: Leonie Man, MD;  Location: [facility];  Service: Cardiovascular;  Laterality: N/A;   CATARACT EXTRACTION W/ INTRAOCULAR LENS  IMPLANT, BILATERAL Bilateral    CHOLECYSTECTOMY     COLOSTOMY  2006  permananet end colostomy after APR resection of low rectal cancer.  Dr Dalbert Batman   DILATION AND CURETTAGE OF UTERUS     FRACTURE SURGERY     ICD IMPLANT  09/23/2012   MDT, implanted for primary prevention by Dr Deno Etienne in [place]  2007   SBO.  Dr Dalbert Batman   PARASTOMAL HERNIA REPAIR  2007   lysis of adhesions   TUBAL LIGATION      Family History  Problem Relation Age of Onset   CAD Other    Hypertension Other    Stroke Other    Heart disease Mother        No details   Colon cancer Neg Hx    GI Bleed  Neg Hx     Social History   Tobacco Use   Smoking status: Never   Smokeless tobacco: Never  Vaping Use   Vaping Use: Never used  Substance Use Topics   Alcohol use: No   Drug use: No    Medications: I have reviewed the patient's current medications.  No Known Allergies   ROS:  Constitutional: negative for chills, fatigue, and fevers Respiratory: negative for shortness of breath Cardiovascular: negative for chest pain Gastrointestinal: negative for abdominal pain, nausea, and vomiting  Blood pressure (!) 125/59, pulse 93, temperature 99 F (37.2 C), resp. rate 16, height 5\' 6"  (1.676 m), weight 87.7 kg, SpO2 95 %. Physical Exam Vitals reviewed.  Constitutional:      Appearance: Normal appearance.  Eyes:     Extraocular Movements: Extraocular movements intact.     Pupils: Pupils are equal, round, and reactive to light.  Cardiovascular:     Rate and Rhythm: Normal rate and regular rhythm.  Pulmonary:     Effort: Pulmonary effort is normal.     Comments: Nasal cannula in place on 2 L Abdominal:     Comments: Abdomen is soft, nondistended, no percussion tenderness, mild tenderness to palpation in bilateral lower quadrants; midline cicatrix well-healed; ostomy in left side of abdomen, with ostomy appliance tense and full of gas, small amount of liquid and formed stool noted; ostomy digitalized and noted to be open past the fascia; no rigidity, guarding, rebound tenderness; parastomal hernia appreciated, soft, nontender  Musculoskeletal:        General: Normal range of motion.  Skin:    General: Skin is warm.  Neurological:     General: No focal deficit present.     Mental Status: She is alert. Mental status is at baseline.  Psychiatric:        Mood and Affect: Mood normal.        Behavior: Behavior normal.    Results: Results for orders placed or performed during the hospital encounter of 10/30/21 (from the past 48 hour(s))  Lactic acid, plasma     Status: None    Collection Time: 10/30/21  5:32 PM  Result Value Ref Range   Lactic Acid, Venous 0.9 0.5 - 1.9 mmol/L    Comment: Performed at [facility], [address], [place], [place] [address]  Comprehensive metabolic panel     Status: Abnormal   Collection Time: 10/30/21  5:32 PM  Result Value Ref Range   Sodium 138 135 - 145 mmol/L   Potassium 4.4 3.5 - 5.1 mmol/L   Chloride 101 98 - 111 mmol/L   CO2 28 22 - 32 mmol/L   Glucose, Bld 112 (H) 70 - 99 mg/dL    Comment: Glucose reference  range applies only to samples taken after fasting for at least 8 hours.   BUN 31 (H) 8 - 23 mg/dL   Creatinine, Ser 2.02 (H) 0.44 - 1.00 mg/dL   Calcium 9.0 8.9 - 10.3 mg/dL   Total Protein 7.4 6.5 - 8.1 g/dL   Albumin 4.0 3.5 - 5.0 g/dL   AST 18 15 - 41 U/L   ALT 20 0 - 44 U/L   Alkaline Phosphatase 77 38 - 126 U/L   Total Bilirubin 0.4 0.3 - 1.2 mg/dL   GFR, Estimated 24 (L) >60 mL/min    Comment: (NOTE) Calculated using the CKD-EPI Creatinine Equation (2021)    Anion gap 9 5 - 15    Comment: Performed at [facility], [address], [place], [place] [address]  CBC WITH DIFFERENTIAL     Status: Abnormal   Collection Time: 10/30/21  5:32 PM  Result Value Ref Range   WBC 6.8 4.0 - 10.5 K/uL   RBC 3.60 (L) 3.87 - 5.11 MIL/uL   Hemoglobin 11.1 (L) 12.0 - 15.0 g/dL   HCT 36.4 36.0 - 46.0 %   MCV 101.1 (H) 80.0 - 100.0 fL   MCH 30.8 26.0 - 34.0 pg   MCHC 30.5 30.0 - 36.0 g/dL   RDW 15.9 (H) 11.5 - 15.5 %   Platelets 213 150 - 400 K/uL   nRBC 0.0 0.0 - 0.2 %   Neutrophils Relative % 74 %   Neutro Abs 5.0 1.7 - 7.7 K/uL   Lymphocytes Relative 12 %   Lymphs Abs 0.8 0.7 - 4.0 K/uL   Monocytes Relative 9 %   Monocytes Absolute 0.6 0.1 - 1.0 K/uL   Eosinophils Relative 5 %   Eosinophils Absolute 0.3 0.0 - 0.5 K/uL   Basophils Relative 0 %   Basophils Absolute 0.0 0.0 - 0.1 K/uL   Immature Granulocytes 0 %   Abs Immature Granulocytes 0.03 0.00 - 0.07 K/uL    Comment: Performed at [facility],  [address], [place], [place] [address]  Protime-INR     Status: None   Collection Time: 10/30/21  5:32 PM  Result Value Ref Range   Prothrombin Time 14.5 11.4 - 15.2 seconds   INR 1.1 0.8 - 1.2    Comment: (NOTE) INR goal varies based on device and disease states. Performed at [facility], [address], [place], [place] [address]   APTT     Status: None   Collection Time: 10/30/21  5:32 PM  Result Value Ref Range   aPTT 32 24 - 36 seconds    Comment: Performed at [facility], [address] [place], [place] [address]  Blood Culture (routine x 2)     Status: None (Preliminary result)   Collection Time: 10/30/21  5:32 PM   Specimen: BLOOD  Result Value Ref Range   Specimen Description BLOOD REJ BOTTLES DRAWN AEROBIC AND ANAEROBIC    Special Requests      Blood Culture adequate volume Performed at [facility], [address] [place],  [address]    Culture PENDING    Report Status PENDING   Blood Culture (routine x 2)     Status: None (Preliminary result)   Collection Time: 10/30/21  5:33 PM   Specimen: BLOOD LEFT FOREARM  Result Value Ref Range   Specimen Description      BLOOD LEFT FOREARM BOTTLES DRAWN AEROBIC AND ANAEROBIC   Special Requests      Blood Culture results may not  be optimal due to an inadequate volume of blood received in culture bottles Performed at [facility], [address] [place], [place] [address]    Culture PENDING    Report Status PENDING   Blood gas, arterial     Status: Abnormal   Collection Time: 10/30/21  5:40 PM  Result Value Ref Range   FIO2 21.00    pH, Arterial 7.363 7.350 - 7.450   pCO2 arterial 51.1 (H) 32.0 - 48.0 mmHg   pO2, Arterial 44.4 (L) 83.0 - 108.0 mmHg   Bicarbonate 26.4 20.0 - 28.0 mmol/L   Acid-Base Excess 3.4 (H) 0.0 - 2.0 mmol/L   O2 Saturation 74.3 %   Patient temperature 36.7    Collection site RIGHT RADIAL    Sample type ARTERIAL    Allens test (pass/fail) PASS PASS    Comment: Performed at [facility], [address], [place], [place] [address]  Resp Panel by RT-PCR (Flu A&B, Covid) Nasopharyngeal Swab     Status: None   Collection Time: 10/30/21  5:47 PM   Specimen: Nasopharyngeal Swab; Nasopharyngeal(NP) swabs in vial transport medium  Result Value Ref Range   SARS Coronavirus 2 by RT PCR NEGATIVE NEGATIVE    Comment: (NOTE) SARS-CoV-2 target nucleic acids are NOT DETECTED.  The SARS-CoV-2 RNA is generally detectable in upper respiratory specimens during the acute phase of infection. The lowest concentration of SARS-CoV-2 viral copies this assay can detect is 138 copies/mL. A negative result does not preclude SARS-Cov-2 infection and should not be used as the sole basis for treatment or other patient management decisions. A negative result may occur with  improper specimen collection/handling, submission of specimen other than nasopharyngeal swab, presence of viral mutation(s) within the areas targeted by this assay, and inadequate number of viral copies(<138 copies/mL). A negative result must be combined with clinical observations, patient history, and epidemiological information. The expected result is Negative.  Fact Sheet for Patients:  EntrepreneurPulse.com.au  Fact Sheet for Healthcare Providers:  IncredibleEmployment.be  This test is no t yet approved or cleared by the [place] FDA and  has been authorized for detection and/or diagnosis of SARS-CoV-2 by FDA under an Emergency Use Authorization (EUA). This EUA will remain  in effect (meaning this test can be used) for the duration of the COVID-19 declaration under Section 564(b)(1) of the Act, 21 U.S.C.section 360bbb-3(b)(1), unless the authorization is terminated  or revoked sooner.       Influenza A by PCR NEGATIVE NEGATIVE   Influenza B by PCR NEGATIVE NEGATIVE    Comment: (NOTE) The Xpert Xpress SARS-CoV-2/FLU/RSV plus assay is intended as an aid in the diagnosis of  influenza from Nasopharyngeal swab specimens and should not be used as a sole basis for treatment. Nasal washings and aspirates are unacceptable for Xpert Xpress SARS-CoV-2/FLU/RSV testing.  Fact Sheet for Patients: EntrepreneurPulse.com.au  Fact Sheet for Healthcare Providers: IncredibleEmployment.be  This test is not yet approved or cleared by the [place] FDA and has been authorized for detection and/or diagnosis of SARS-CoV-2 by FDA under an Emergency Use Authorization (EUA). This EUA will remain in effect (meaning this test can be used) for the duration of the COVID-19 declaration under Section 564(b)(1) of the Act, 21 U.S.C. section 360bbb-3(b)(1), unless the authorization is terminated or revoked.  Performed at [facility], [address] [place],  [address]   Lactic acid, plasma     Status: Abnormal   Collection Time: 10/30/21  7:35 PM  Result Value Ref  Range   Lactic Acid, Venous 2.1 (HH) 0.5 - 1.9 mmol/L    Comment: CRITICAL RESULT CALLED TO, READ BACK BY AND VERIFIED WITH: TEASLEY,B AT 2007 ON 1.22.23 BY RUCINSKI,B Performed at [facility], [address] [place], [place] [address]   Lactic acid, plasma     Status: None   Collection Time: 10/30/21 11:14 PM  Result Value Ref Range   Lactic Acid, Venous 1.3 0.5 - 1.9 mmol/L    Comment: Performed at [facility], [address], [place], [place] [address]  MRSA Next Gen by PCR, Nasal     Status: None   Collection Time: 10/31/21  1:26 AM   Specimen: Nasal Mucosa; Nasal Swab  Result Value Ref Range   MRSA by PCR Next Gen NOT DETECTED NOT DETECTED    Comment: (NOTE) The GeneXpert MRSA Assay (FDA approved for NASAL specimens only), is one component of a comprehensive MRSA colonization surveillance program. It is not intended to diagnose MRSA infection nor to guide or monitor treatment for MRSA infections. Test performance is not FDA approved in patients less than 44  years old. Performed at [facility], [address], [place], [place] [address]   Lactic acid, plasma     Status: None   Collection Time: 10/31/21  2:30 AM  Result Value Ref Range   Lactic Acid, Venous 0.7 0.5 - 1.9 mmol/L    Comment: Performed at [facility], [address] [place], [place] [address]  Brain natriuretic peptide     Status: Abnormal   Collection Time: 10/31/21  2:30 AM  Result Value Ref Range   B Natriuretic Peptide 1,187.0 (H) 0.0 - 100.0 pg/mL    Comment: Performed at [facility], [address], [place], [place] [address]  Procalcitonin     Status: None   Collection Time: 10/31/21  2:30 AM  Result Value Ref Range   Procalcitonin 0.19 ng/mL    Comment:        Interpretation: PCT (Procalcitonin) <= 0.5 ng/mL: Systemic infection (sepsis) is not likely. Local bacterial infection is possible. (NOTE)       Sepsis PCT Algorithm           Lower Respiratory Tract                                      Infection PCT Algorithm    ----------------------------     ----------------------------         PCT < 0.25 ng/mL                PCT < 0.10 ng/mL          Strongly encourage             Strongly discourage   discontinuation of antibiotics    initiation of antibiotics    ----------------------------     -----------------------------       PCT 0.25 - 0.50 ng/mL            PCT 0.10 - 0.25 ng/mL               OR       >80% decrease in PCT            Discourage initiation of  antibiotics      Encourage discontinuation           of antibiotics    ----------------------------     -----------------------------         PCT >= 0.50 ng/mL              PCT 0.26 - 0.50 ng/mL               AND        <80% decrease in PCT             Encourage initiation of                                             antibiotics       Encourage continuation           of antibiotics    ----------------------------     -----------------------------         PCT >= 0.50 ng/mL                  PCT > 0.50 ng/mL               AND         increase in PCT                  Strongly encourage                                      initiation of antibiotics    Strongly encourage escalation           of antibiotics                                     -----------------------------                                           PCT <= 0.25 ng/mL                                                 OR                                        > 80% decrease in PCT                                      Discontinue / Do not initiate                                             antibiotics  Performed at [facility], [address] [place], [place] [address]   Comprehensive metabolic panel     Status: Abnormal   Collection Time:  10/31/21  2:30 AM  Result Value Ref Range   Sodium 138 135 - 145 mmol/L   Potassium 4.0 3.5 - 5.1 mmol/L   Chloride 102 98 - 111 mmol/L   CO2 28 22 - 32 mmol/L   Glucose, Bld 102 (H) 70 - 99 mg/dL    Comment: Glucose reference range applies only to samples taken after fasting for at least 8 hours.   BUN 31 (H) 8 - 23 mg/dL   Creatinine, Ser 1.73 (H) 0.44 - 1.00 mg/dL   Calcium 8.8 (L) 8.9 - 10.3 mg/dL   Total Protein 6.9 6.5 - 8.1 g/dL   Albumin 3.6 3.5 - 5.0 g/dL   AST 18 15 - 41 U/L   ALT 18 0 - 44 U/L   Alkaline Phosphatase 68 38 - 126 U/L   Total Bilirubin 0.5 0.3 - 1.2 mg/dL   GFR, Estimated 29 (L) >60 mL/min    Comment: (NOTE) Calculated using the CKD-EPI Creatinine Equation (2021)    Anion gap 8 5 - 15    Comment: Performed at [facility], [address], [place], [place] [address]  Magnesium     Status: None   Collection Time: 10/31/21  2:30 AM  Result Value Ref Range   Magnesium 2.3 1.7 - 2.4 mg/dL    Comment: Performed at [facility], [address] [place], [place] [address]  CBC WITH DIFFERENTIAL     Status: Abnormal   Collection Time: 10/31/21  2:30 AM  Result Value Ref Range   WBC 6.2 4.0 - 10.5 K/uL   RBC 3.47  (L) 3.87 - 5.11 MIL/uL   Hemoglobin 10.6 (L) 12.0 - 15.0 g/dL   HCT 34.7 (L) 36.0 - 46.0 %   MCV 100.0 80.0 - 100.0 fL   MCH 30.5 26.0 - 34.0 pg   MCHC 30.5 30.0 - 36.0 g/dL   RDW 15.8 (H) 11.5 - 15.5 %   Platelets 183 150 - 400 K/uL   nRBC 0.0 0.0 - 0.2 %   Neutrophils Relative % 74 %   Neutro Abs 4.5 1.7 - 7.7 K/uL   Lymphocytes Relative 12 %   Lymphs Abs 0.7 0.7 - 4.0 K/uL   Monocytes Relative 10 %   Monocytes Absolute 0.6 0.1 - 1.0 K/uL   Eosinophils Relative 4 %   Eosinophils Absolute 0.3 0.0 - 0.5 K/uL   Basophils Relative 0 %   Basophils Absolute 0.0 0.0 - 0.1 K/uL   Immature Granulocytes 0 %   Abs Immature Granulocytes 0.01 0.00 - 0.07 K/uL    Comment: Performed at [facility], [address] [place], [place] [address]  TSH     Status: None   Collection Time: 10/31/21  2:30 AM  Result Value Ref Range   TSH 0.734 0.350 - 4.500 uIU/mL    Comment: Performed by a 3rd Generation assay with a functional sensitivity of <=0.01 uIU/mL. Performed at [facility], [address] [place], [place] [address]   Troponin I (High Sensitivity)     Status: Abnormal   Collection Time: 10/31/21  2:30 AM  Result Value Ref Range   Troponin I (High Sensitivity) 318 (HH) <18 ng/L    Comment: CRITICAL RESULT CALLED TO, READ BACK BY AND VERIFIED WITH: ENGLE,B @ 0316 ON 10/31/21 BY JUW (NOTE) Elevated high sensitivity troponin I (hsTnI) values and significant  changes across serial measurements may suggest ACS but many other  chronic and acute conditions are known to elevate hsTnI results.  Refer to  the Links section for chest pain algorithms and additional  guidance. Performed at [facility], [address], [place], [place] [address]   Urinalysis, Routine w reflex microscopic Urine, Clean Catch     Status: Abnormal   Collection Time: 10/31/21  3:05 AM  Result Value Ref Range   Color, Urine YELLOW YELLOW   APPearance CLEAR CLEAR   Specific Gravity, Urine 1.016 1.005 - 1.030   pH 5.0 5.0  - 8.0   Glucose, UA NEGATIVE NEGATIVE mg/dL   Hgb urine dipstick NEGATIVE NEGATIVE   Bilirubin Urine NEGATIVE NEGATIVE   Ketones, ur NEGATIVE NEGATIVE mg/dL   Protein, ur 30 (A) NEGATIVE mg/dL   Nitrite NEGATIVE NEGATIVE   Leukocytes,Ua NEGATIVE NEGATIVE   RBC / HPF 0-5 0 - 5 RBC/hpf   WBC, UA 0-5 0 - 5 WBC/hpf   Bacteria, UA RARE (A) NONE SEEN   Squamous Epithelial / LPF 0-5 0 - 5   Hyaline Casts, UA PRESENT     Comment: Performed at [facility], [address] [place], [place] [address]  Troponin I (High Sensitivity)     Status: Abnormal   Collection Time: 10/31/21  5:17 AM  Result Value Ref Range   Troponin I (High Sensitivity) 244 (HH) <18 ng/L    Comment: CRITICAL VALUE NOTED.  VALUE IS CONSISTENT WITH PREVIOUSLY REPORTED AND CALLED VALUE. (NOTE) Elevated high sensitivity troponin I (hsTnI) values and significant  changes across serial measurements may suggest ACS but many other  chronic and acute conditions are known to elevate hsTnI results.  Refer to the Links section for chest pain algorithms and additional  guidance. Performed at [facility], [address], [place], [place] [address]   Glucose, capillary     Status: None   Collection Time: 10/31/21  5:56 AM  Result Value Ref Range   Glucose-Capillary 78 70 - 99 mg/dL    Comment: Glucose reference range applies only to samples taken after fasting for at least 8 hours.  Glucose, capillary     Status: Abnormal   Collection Time: 10/31/21  6:20 AM  Result Value Ref Range   Glucose-Capillary 145 (H) 70 - 99 mg/dL    Comment: Glucose reference range applies only to samples taken after fasting for at least 8 hours.    CT ABDOMEN PELVIS WO CONTRAST  Result Date: 10/30/2021 CLINICAL DATA:  Abdominal pain, acute, nonlocalized; Chest pain, nonspecific SOB, hypoxia. Unspecified abdominal pain, generalized weakness. EXAM: CT CHEST, ABDOMEN AND PELVIS WITHOUT CONTRAST TECHNIQUE: Multidetector CT imaging of the chest, abdomen  and pelvis was performed following the standard protocol without IV contrast. RADIATION DOSE REDUCTION: This exam was performed according to the departmental dose-optimization program which includes automated exposure control, adjustment of the mA and/or kV according to patient size and/or use of iterative reconstruction technique. COMPARISON:  04/22/2017 FINDINGS: CT CHEST FINDINGS Cardiovascular: Moderate multi-vessel coronary artery calcification, predominantly within the left anterior descending and left main coronary arteries. Extensive calcification of the mitral valve annulus. Calcification of the aortic valve leaflets noted. Mild-to-moderate global cardiomegaly appears stable since prior examination. Left subclavian dual lead pacemaker leads noted within the right atrium and right ventricle. No pericardial effusion. Central pulmonary arteries are enlarged in keeping with changes of pulmonary arterial hypertension. The thoracic aorta demonstrates moderate atherosclerotic calcification. Bovine arch anatomy noted. The ascending aorta is dilated measuring 4.2 cm in greatest dimension (coronal image # 84/5, stable since prior examination. The descending thoracic aorta is of normal caliber. Mediastinum/Nodes: Asymmetric enlargement of  the right thyroid gland is unchanged, likely anatomic variation given its stability over time. No pathologic thoracic adenopathy. The esophagus is unremarkable. Small hiatal hernia. Lungs/Pleura: The pulmonary venous vasculature is engorged centrally and there is subtle diffuse parenchymal ground-glass opacity demonstrating a basilar gradient which is suspicious for mild alveolar pulmonary edema, likely cardiogenic in nature. No pneumothorax or pleural effusion. Musculoskeletal: The osseous structures are diffusely osteopenic. No acute bone abnormality. No lytic or blastic bone lesion. CT ABDOMEN PELVIS FINDINGS Hepatobiliary: Cholelithiasis without pericholecystic inflammatory  change noted. The liver is unremarkable. No intra or extrahepatic biliary ductal dilation. Pancreas: Unremarkable Spleen: Unremarkable Adrenals/Urinary Tract: The adrenal glands are unremarkable. The kidneys are normal in size and position. Simple cortical cyst noted within the lower pole the right kidney. Probable vascular calcifications within the renal hila bilaterally. The kidneys are otherwise unremarkable. The bladder is unremarkable. Stomach/Bowel: Surgical changes of abdominal peroneal resection and left lower quadrant descending colostomy are identified. There is moderate stool seen throughout the colon with a rapid transition in the caliber of the colon at the stomal hiatus through the abdominal fascia, best seen on axial image # 88/2, suggesting a partial large bowel obstruction. There is a parastomal hernia associated with a single loop of mid jejunum within the hernia sac. This results in a mild partial small bowel obstruction with mildly dilated loops of small bowel upstream from the hernia though gas and fluid is seen within the more distal small bowel. The appendix is not visualized and is likely absent. No free intraperitoneal gas or fluid. Vascular/Lymphatic: Aortic atherosclerosis. No enlarged abdominal or pelvic lymph nodes. Reproductive: Status post hysterectomy. No adnexal masses. Other: None Musculoskeletal: Stable remote superior endplate fracture of L1. Mild interval compression deformity of L5, likely remote in nature without evidence of retropulsion. The osseous structures are diffusely osteopenic. No acute bone abnormality identified. IMPRESSION: Moderate multi-vessel coronary artery calcification. Stable cardiomegaly. Morphologic changes in keeping with pulmonary arterial hypertension. Mild diffuse, basilar predominant parenchymal ground-glass opacity suspicious for mild alveolar pulmonary edema, likely cardiogenic in nature. Calcification of the aortic valve leaflets. Echocardiography  may be helpful to assess the degree of valvular dysfunction. Mild dilation of the ascending aorta with maximal diameter of 4.2 cm, stable since prior examination of 04/22/2017. Recommend annual imaging followup by CTA or MRA. This recommendation follows 2010 ACCF/AHA/AATS/ACR/ASA/SCA/SCAI/SIR/STS/SVM Guidelines for the Diagnosis and Management of Patients with Thoracic Aortic Disease. Circulation. 2010; 121: Q008-Q761. Aortic aneurysm NOS (ICD10-I71.9) Cholelithiasis. Surgical changes of abdominal peroneal resection and descending colostomy. Moderate retained stool throughout the colon and rapid transition at the abdominal fascial hiatus suggests a partial large bowel obstruction. Superimposed parastomal hernia containing a single loop of small bowel with mild dilation of the upstream small bowel in keeping with a mild partial small bowel obstruction. Osteopenia. Interval compression deformity of L5, likely remote in nature. Stable compression deformity of L1. No retropulsion. Aortic aneurysm NOS (ICD10-I71.9). Aortic Atherosclerosis (ICD10-I70.0). Electronically Signed   By: Fidela Salisbury M.D.   On: 10/30/2021 20:29   CT Chest Wo Contrast  Result Date: 10/30/2021 CLINICAL DATA:  Abdominal pain, acute, nonlocalized; Chest pain, nonspecific SOB, hypoxia. Unspecified abdominal pain, generalized weakness. EXAM: CT CHEST, ABDOMEN AND PELVIS WITHOUT CONTRAST TECHNIQUE: Multidetector CT imaging of the chest, abdomen and pelvis was performed following the standard protocol without IV contrast. RADIATION DOSE REDUCTION: This exam was performed according to the departmental dose-optimization program which includes automated exposure control, adjustment of the mA and/or kV according to patient size and/or use  of iterative reconstruction technique. COMPARISON:  04/22/2017 FINDINGS: CT CHEST FINDINGS Cardiovascular: Moderate multi-vessel coronary artery calcification, predominantly within the left anterior descending and  left main coronary arteries. Extensive calcification of the mitral valve annulus. Calcification of the aortic valve leaflets noted. Mild-to-moderate global cardiomegaly appears stable since prior examination. Left subclavian dual lead pacemaker leads noted within the right atrium and right ventricle. No pericardial effusion. Central pulmonary arteries are enlarged in keeping with changes of pulmonary arterial hypertension. The thoracic aorta demonstrates moderate atherosclerotic calcification. Bovine arch anatomy noted. The ascending aorta is dilated measuring 4.2 cm in greatest dimension (coronal image # 84/5, stable since prior examination. The descending thoracic aorta is of normal caliber. Mediastinum/Nodes: Asymmetric enlargement of the right thyroid gland is unchanged, likely anatomic variation given its stability over time. No pathologic thoracic adenopathy. The esophagus is unremarkable. Small hiatal hernia. Lungs/Pleura: The pulmonary venous vasculature is engorged centrally and there is subtle diffuse parenchymal ground-glass opacity demonstrating a basilar gradient which is suspicious for mild alveolar pulmonary edema, likely cardiogenic in nature. No pneumothorax or pleural effusion. Musculoskeletal: The osseous structures are diffusely osteopenic. No acute bone abnormality. No lytic or blastic bone lesion. CT ABDOMEN PELVIS FINDINGS Hepatobiliary: Cholelithiasis without pericholecystic inflammatory change noted. The liver is unremarkable. No intra or extrahepatic biliary ductal dilation. Pancreas: Unremarkable Spleen: Unremarkable Adrenals/Urinary Tract: The adrenal glands are unremarkable. The kidneys are normal in size and position. Simple cortical cyst noted within the lower pole the right kidney. Probable vascular calcifications within the renal hila bilaterally. The kidneys are otherwise unremarkable. The bladder is unremarkable. Stomach/Bowel: Surgical changes of abdominal peroneal resection and  left lower quadrant descending colostomy are identified. There is moderate stool seen throughout the colon with a rapid transition in the caliber of the colon at the stomal hiatus through the abdominal fascia, best seen on axial image # 88/2, suggesting a partial large bowel obstruction. There is a parastomal hernia associated with a single loop of mid jejunum within the hernia sac. This results in a mild partial small bowel obstruction with mildly dilated loops of small bowel upstream from the hernia though gas and fluid is seen within the more distal small bowel. The appendix is not visualized and is likely absent. No free intraperitoneal gas or fluid. Vascular/Lymphatic: Aortic atherosclerosis. No enlarged abdominal or pelvic lymph nodes. Reproductive: Status post hysterectomy. No adnexal masses. Other: None Musculoskeletal: Stable remote superior endplate fracture of L1. Mild interval compression deformity of L5, likely remote in nature without evidence of retropulsion. The osseous structures are diffusely osteopenic. No acute bone abnormality identified. IMPRESSION: Moderate multi-vessel coronary artery calcification. Stable cardiomegaly. Morphologic changes in keeping with pulmonary arterial hypertension. Mild diffuse, basilar predominant parenchymal ground-glass opacity suspicious for mild alveolar pulmonary edema, likely cardiogenic in nature. Calcification of the aortic valve leaflets. Echocardiography may be helpful to assess the degree of valvular dysfunction. Mild dilation of the ascending aorta with maximal diameter of 4.2 cm, stable since prior examination of 04/22/2017. Recommend annual imaging followup by CTA or MRA. This recommendation follows 2010 ACCF/AHA/AATS/ACR/ASA/SCA/SCAI/SIR/STS/SVM Guidelines for the Diagnosis and Management of Patients with Thoracic Aortic Disease. Circulation. 2010; 121: X937-J696. Aortic aneurysm NOS (ICD10-I71.9) Cholelithiasis. Surgical changes of abdominal peroneal  resection and descending colostomy. Moderate retained stool throughout the colon and rapid transition at the abdominal fascial hiatus suggests a partial large bowel obstruction. Superimposed parastomal hernia containing a single loop of small bowel with mild dilation of the upstream small bowel in keeping with a mild partial small bowel  obstruction. Osteopenia. Interval compression deformity of L5, likely remote in nature. Stable compression deformity of L1. No retropulsion. Aortic aneurysm NOS (ICD10-I71.9). Aortic Atherosclerosis (ICD10-I70.0). Electronically Signed   By: Fidela Salisbury M.D.   On: 10/30/2021 20:29   DG Chest Port 1 View  Result Date: 10/30/2021 CLINICAL DATA:  Shortness of breath.  Abdominal pain and weakness. EXAM: PORTABLE CHEST 1 VIEW COMPARISON:  02/06/2020 FINDINGS: AICD noted. Low lung volumes are present, causing crowding of the pulmonary vasculature. Mild to moderate cardiomegaly. Indistinct left hemidiaphragm, this has been present on prior exams and accordingly may be configuration ule but I cannot exclude a left lower lobe airspace opacity. Atherosclerotic calcification of the aortic arch. IMPRESSION: 1. Low lung volumes are present, causing crowding of the pulmonary vasculature. 2.  Aortic Atherosclerosis (ICD10-I70.0). 3. 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 Part of the left lung base is also covered by the patient's AICD pulse generator. Electronically Signed   By: Van Clines M.D.   On: 10/30/2021 17:18   ECHOCARDIOGRAM COMPLETE  Result Date: 10/31/2021    ECHOCARDIOGRAM REPORT   Patient Name:   [name] Date of Exam: 10/31/2021 Medical Rec #:  [id number]     Height:       66.0 in Accession #:    [id number]    Weight:       193.3 lb Date of Birth:   1934-03-19     BSA:          1.971 m Patient Age:    11 years      BP:           143/72 mmHg Patient Gender: F             HR:           96 bpm. Exam Location:  [facility] Procedure: 2D Echo, Color Doppler, Cardiac Doppler and Intracardiac            Opacification Agent Indications:    Congestive heart failure  History:        Patient has prior history of Echocardiogram examinations, most                 recent 02/03/2020. CHF and Cardiomyopathy; Defibrillator.  Sonographer:    Wenda Low Referring Phys: [id number] ASIA B [place]  1. Left ventricular ejection fraction, by estimation, is 45 to 50%. The left ventricle has mildly decreased function. The left ventricle demonstrates global hypokinesis. There is severe concentric left ventricular hypertrophy. Left ventricular diastolic  function could not be evaluated. Elevated left ventricular end-diastolic pressure.  2. Right ventricular systolic function is mildly reduced. The right ventricular size is mildly enlarged.  3. Left atrial size was severely dilated.  4. Right atrial size was moderately dilated.  5. There is heavy calcification of the medial aspect of the mitral valve annulus.. The mitral valve is degenerative. Mild mitral valve regurgitation. Severe mitral stenosis. Severe mitral annular calcification.  6. Tricuspid valve regurgitation is mild to moderate.  7. The aortic valve is calcified. There is severe calcifcation of the aortic valve. Aortic valve regurgitation is moderate. Mild aortic valve stenosis. Aortic valve area, by VTI measures 1.88 cm. Aortic valve mean gradient measures 7.0 mmHg. Aortic valve Vmax measures 1.89 m/s.  8. Aortic dilatation noted. There  is mild dilatation of the ascending aorta, measuring 39 mm. FINDINGS  Left Ventricle: Left ventricular ejection fraction, by estimation, is 45 to 50%. The left ventricle has mildly decreased function. The left ventricle demonstrates global hypokinesis. The left ventricular  internal cavity size was normal in size. There is  severe concentric left ventricular hypertrophy. Abnormal (paradoxical) septal motion, consistent with left bundle branch block. Left ventricular diastolic function could not be evaluated. Elevated left ventricular end-diastolic pressure. Right Ventricle: The right ventricular size is mildly enlarged. No increase in right ventricular wall thickness. Right ventricular systolic function is mildly reduced. Left Atrium: Left atrial size was severely dilated. Right Atrium: Right atrial size was moderately dilated. Pericardium: There is no evidence of pericardial effusion. Mitral Valve: There is heavy calcification of the medial aspect of the mitral valve annulus. The mitral valve is degenerative in appearance. There is mild thickening of the mitral valve leaflet(s). There is mild calcification of the mitral valve leaflet(s). Severe mitral annular calcification. Mild mitral valve regurgitation. Severe mitral valve stenosis. MV peak gradient, 28.9 mmHg. The mean mitral valve gradient is 11.0 mmHg. Tricuspid Valve: The tricuspid valve is normal in structure. Tricuspid valve regurgitation is mild to moderate. No evidence of tricuspid stenosis. Aortic Valve: The aortic valve is calcified. There is severe calcifcation of the aortic valve. Aortic valve regurgitation is moderate. Aortic regurgitation PHT measures 363 msec. Mild aortic stenosis is present. Aortic valve mean gradient measures 7.0 mmHg. Aortic valve peak gradient measures 14.3 mmHg. Aortic valve area, by VTI measures 1.88 cm. Pulmonic Valve: The pulmonic valve was normal in structure. Pulmonic valve regurgitation is mild. No evidence of pulmonic stenosis. Aorta: Aortic dilatation noted. There is mild dilatation of the ascending aorta, measuring 39 mm. Venous: The inferior vena cava was not well visualized. IAS/Shunts: No atrial level shunt detected by color flow Doppler. Additional Comments: A device lead is  visualized.  LEFT VENTRICLE PLAX 2D LVIDd:         3.90 cm   Diastology LVIDs:         2.90 cm   LV e' medial:    6.42 cm/s LV PW:         1.60 cm   LV E/e' medial:  38.2 LV IVS:        1.80 cm   LV e' lateral:   8.92 cm/s LVOT diam:     1.80 cm   LV E/e' lateral: 27.5 LV SV:         62 LV SV Index:   31 LVOT Area:     2.54 cm  RIGHT VENTRICLE RV Basal diam:  3.85 cm RV Mid diam:    3.70 cm LEFT ATRIUM              Index        RIGHT ATRIUM           Index LA diam:        4.10 cm  2.08 cm/m   RA Area:     20.60 cm LA Vol (A2C):   156.0 ml 79.14 ml/m  RA Volume:   70.10 ml  35.56 ml/m LA Vol (A4C):   119.0 ml 60.37 ml/m LA Biplane Vol: 148.0 ml 75.08 ml/m  AORTIC VALVE                     PULMONIC VALVE AV Area (Vmax):    1.75 cm      PV Vmax:  0.63 m/s AV Area (Vmean):   1.81 cm      PV Peak grad:  1.6 mmHg AV Area (VTI):     1.88 cm AV Vmax:           189.00 cm/s AV Vmean:          115.000 cm/s AV VTI:            0.331 m AV Peak Grad:      14.3 mmHg AV Mean Grad:      7.0 mmHg LVOT Vmax:         130.00 cm/s LVOT Vmean:        82.000 cm/s LVOT VTI:          0.244 m LVOT/AV VTI ratio: 0.74 AI PHT:            363 msec  AORTA Ao Root diam: 3.40 cm Ao Asc diam:  3.90 cm MITRAL VALVE                TRICUSPID VALVE MV Area (PHT): 5.31 cm     TR Peak grad:   42.0 mmHg MV Area VTI:   1.68 cm     TR Vmax:        324.00 cm/s MV Peak grad:  28.9 mmHg MV Mean grad:  11.0 mmHg    SHUNTS MV Vmax:       2.69 m/s     Systemic VTI:  0.24 m MV Vmean:      140.0 cm/s   Systemic Diam: 1.80 cm MV Decel Time: 143 msec MV E velocity: 245.00 cm/s Fransico Him MD Electronically signed by Fransico Him MD Signature Date/Time: 10/31/2021/11:50:25 AM    Final      Assessment & Plan:  MESHELLE HOLNESS is a 88 y.o. female who was admitted in acute CHF exacerbation, and CT abdomen and pelvis concerning for mild partial small bowel obstruction and possible partial large bowel obstruction. No leukocytosis, BNP 1187, elevated  high-sensitivity troponins, AKI with creatinine of 1.73 (improved from 2.02 yesterday)  -Upon my evaluation of the imaging, the patient has stool and air throughout the colon with mild small bowel dilation and a loop of jejunum within parastomal hernia -On examination, she is noted to have a large amount of gas in her ostomy was some liquid and formed stool as well.  She is relatively non-tender. Her ostomy was also digitalized, and the fascia was noted to be widely patent.  Patient is currently denying nausea and vomiting, and states that she does feel thirsty. -Will trial clear liquid diet to see how patient progresses given ostomy is functioning -Would consider placement of NG tube patient has nausea and vomiting with clears -Dulcolax suppositories and Colace ordered -Will continue to monitor patient's progression -Remainder of care per primary team  All questions were answered to the satisfaction of the patient  -- Graciella Freer, DO [facility] [address] [place], [place] [address] [phone] (office)

## 2021-11-01 DIAGNOSIS — K56609 Unspecified intestinal obstruction, unspecified as to partial versus complete obstruction: Secondary | ICD-10-CM | POA: Diagnosis not present

## 2021-11-01 LAB — BASIC METABOLIC PANEL
Anion gap: 13 (ref 5–15)
BUN: 36 mg/dL — ABNORMAL HIGH (ref 8–23)
CO2: 26 mmol/L (ref 22–32)
Calcium: 9 mg/dL (ref 8.9–10.3)
Chloride: 102 mmol/L (ref 98–111)
Creatinine, Ser: 1.43 mg/dL — ABNORMAL HIGH (ref 0.44–1.00)
GFR, Estimated: 36 mL/min — ABNORMAL LOW (ref >60)
Glucose, Bld: 71 mg/dL (ref 70–99)
Potassium: 4.2 mmol/L (ref 3.5–5.1)
Sodium: 141 mmol/L (ref 135–145)

## 2021-11-01 LAB — CBC
HCT: 33.3 % — ABNORMAL LOW (ref 36.0–46.0)
Hemoglobin: 10.1 g/dL — ABNORMAL LOW (ref 12.0–15.0)
MCH: 30.8 pg (ref 26.0–34.0)
MCHC: 30.3 g/dL (ref 30.0–36.0)
MCV: 101.5 fL — ABNORMAL HIGH (ref 80.0–100.0)
Platelets: 173 10*3/uL (ref 150–400)
RBC: 3.28 MIL/uL — ABNORMAL LOW (ref 3.87–5.11)
RDW: 15.2 % (ref 11.5–15.5)
WBC: 5 10*3/uL (ref 4.0–10.5)
nRBC: 0 % (ref 0.0–0.2)

## 2021-11-01 LAB — GLUCOSE, CAPILLARY
Glucose-Capillary: 77 mg/dL (ref 70–99)
Glucose-Capillary: 72 mg/dL (ref 70–99)
Glucose-Capillary: 108 mg/dL — ABNORMAL HIGH (ref 70–99)

## 2021-11-01 LAB — URINE CULTURE: Culture: NO GROWTH

## 2021-11-01 LAB — TROPONIN I (HIGH SENSITIVITY): Troponin I (High Sensitivity): 148 ng/L (ref <18)

## 2021-11-01 LAB — PROCALCITONIN: Procalcitonin: 0.22 ng/mL

## 2021-11-01 LAB — PHOSPHORUS: Phosphorus: 3.9 mg/dL (ref 2.5–4.6)

## 2021-11-01 LAB — MAGNESIUM: Magnesium: 2.2 mg/dL (ref 1.7–2.4)

## 2021-11-01 MED ORDER — SIMETHICONE 80 MG PO CHEW: 80.0000 mg | CHEWABLE_TABLET | Freq: Four times a day (QID) | ORAL | Status: DC | PRN

## 2021-11-01 MED ORDER — DEXTROSE 50 % IV SOLN
INTRAVENOUS | Status: AC
  Administered 2021-11-01: 18:00:00 25 mL via INTRAVENOUS
  Filled 2021-11-01: qty 50

## 2021-11-01 NOTE — Progress Notes (Signed)
[facility] Progress Note     Subjective: Patient seen and examined.  She is resting comfortably in bed, currently getting a bath.  She complains of some bloating, but denies nausea and vomiting.  She also continues to have ostomy stool output and gas.  She has been having some difficulty with swallowing, per nurse requires increased time to swallow her pills.  Objective: Vital signs in last 24 hours: Temp:  [98.1 F (36.7 C)-99.4 F (37.4 C)] 98.1 F (36.7 C) (01/24 0508) Pulse Rate:  [93-103] 96 (01/24 0508) Resp:  [16-20] 16 (01/24 0508) BP: (108-137)/(57-72) 131/61 (01/24 0508) SpO2:  [86 %-100 %] 98 % (01/24 0803) Weight:  [87.8 kg] 87.8 kg (01/24 0508) Last BM Date: 10/31/21  Intake/Output from previous day: 01/23 0701 - 01/24 0700 In: 931.9 [P.O.:480; I.V.:451.9] Out: -  Intake/Output this shift: No intake/output data recorded.  General appearance: alert, cooperative, and no distress Resp: normal respiratory effort GI: Soft, no percussion tenderness, mild tenderness to palpation in bilateral lower quadrants, ostomy appliance with stool and gas noted in bag, ostomy pink and patent, no rigidity, guarding, rebound tenderness, parastomal hernia soft and nontender  Lab Results:  Recent Labs    10/31/21 0230 11/01/21 0538  WBC 6.2 5.0  HGB 10.6* 10.1*  HCT 34.7* 33.3*  PLT 183 173   BMET Recent Labs    10/31/21 0230 11/01/21 0538  NA 138 141  K 4.0 4.2  CL 102 102  CO2 28 26  GLUCOSE 102* 71  BUN 31* 36*  CREATININE 1.73* 1.43*  CALCIUM 8.8* 9.0   PT/INR Recent Labs    10/30/21 1732  LABPROT 14.5  INR 1.1    Studies/Results: CT ABDOMEN PELVIS WO CONTRAST  Result Date: 10/30/2021 CLINICAL DATA:  Abdominal pain, acute, nonlocalized; Chest pain, nonspecific SOB, hypoxia. Unspecified abdominal pain, generalized weakness. EXAM: CT CHEST, ABDOMEN AND PELVIS WITHOUT CONTRAST TECHNIQUE: Multidetector CT imaging of the chest, abdomen and  pelvis was performed following the standard protocol without IV contrast. RADIATION DOSE REDUCTION: This exam was performed according to the departmental dose-optimization program which includes automated exposure control, adjustment of the mA and/or kV according to patient size and/or use of iterative reconstruction technique. COMPARISON:  04/22/2017 FINDINGS: CT CHEST FINDINGS Cardiovascular: Moderate multi-vessel coronary artery calcification, predominantly within the left anterior descending and left main coronary arteries. Extensive calcification of the mitral valve annulus. Calcification of the aortic valve leaflets noted. Mild-to-moderate global cardiomegaly appears stable since prior examination. Left subclavian dual lead pacemaker leads noted within the right atrium and right ventricle. No pericardial effusion. Central pulmonary arteries are enlarged in keeping with changes of pulmonary arterial hypertension. The thoracic aorta demonstrates moderate atherosclerotic calcification. Bovine arch anatomy noted. The ascending aorta is dilated measuring 4.2 cm in greatest dimension (coronal image # 84/5, stable since prior examination. The descending thoracic aorta is of normal caliber. Mediastinum/Nodes: Asymmetric enlargement of the right thyroid gland is unchanged, likely anatomic variation given its stability over time. No pathologic thoracic adenopathy. The esophagus is unremarkable. Small hiatal hernia. Lungs/Pleura: The pulmonary venous vasculature is engorged centrally and there is subtle diffuse parenchymal ground-glass opacity demonstrating a basilar gradient which is suspicious for mild alveolar pulmonary edema, likely cardiogenic in nature. No pneumothorax or pleural effusion. Musculoskeletal: The osseous structures are diffusely osteopenic. No acute bone abnormality. No lytic or blastic bone lesion. CT ABDOMEN PELVIS FINDINGS Hepatobiliary: Cholelithiasis without pericholecystic inflammatory change  noted. The liver is unremarkable. No intra or extrahepatic biliary ductal dilation.  Pancreas: Unremarkable Spleen: Unremarkable Adrenals/Urinary Tract: The adrenal glands are unremarkable. The kidneys are normal in size and position. Simple cortical cyst noted within the lower pole the right kidney. Probable vascular calcifications within the renal hila bilaterally. The kidneys are otherwise unremarkable. The bladder is unremarkable. Stomach/Bowel: Surgical changes of abdominal peroneal resection and left lower quadrant descending colostomy are identified. There is moderate stool seen throughout the colon with a rapid transition in the caliber of the colon at the stomal hiatus through the abdominal fascia, best seen on axial image # 88/2, suggesting a partial large bowel obstruction. There is a parastomal hernia associated with a single loop of mid jejunum within the hernia sac. This results in a mild partial small bowel obstruction with mildly dilated loops of small bowel upstream from the hernia though gas and fluid is seen within the more distal small bowel. The appendix is not visualized and is likely absent. No free intraperitoneal gas or fluid. Vascular/Lymphatic: Aortic atherosclerosis. No enlarged abdominal or pelvic lymph nodes. Reproductive: Status post hysterectomy. No adnexal masses. Other: None Musculoskeletal: Stable remote superior endplate fracture of L1. Mild interval compression deformity of L5, likely remote in nature without evidence of retropulsion. The osseous structures are diffusely osteopenic. No acute bone abnormality identified. IMPRESSION: Moderate multi-vessel coronary artery calcification. Stable cardiomegaly. Morphologic changes in keeping with pulmonary arterial hypertension. Mild diffuse, basilar predominant parenchymal ground-glass opacity suspicious for mild alveolar pulmonary edema, likely cardiogenic in nature. Calcification of the aortic valve leaflets. Echocardiography may be  helpful to assess the degree of valvular dysfunction. Mild dilation of the ascending aorta with maximal diameter of 4.2 cm, stable since prior examination of 04/22/2017. Recommend annual imaging followup by CTA or MRA. This recommendation follows 2010 ACCF/AHA/AATS/ACR/ASA/SCA/SCAI/SIR/STS/SVM Guidelines for the Diagnosis and Management of Patients with Thoracic Aortic Disease. Circulation. 2010; 121: B017-P102. Aortic aneurysm NOS (ICD10-I71.9) Cholelithiasis. Surgical changes of abdominal peroneal resection and descending colostomy. Moderate retained stool throughout the colon and rapid transition at the abdominal fascial hiatus suggests a partial large bowel obstruction. Superimposed parastomal hernia containing a single loop of small bowel with mild dilation of the upstream small bowel in keeping with a mild partial small bowel obstruction. Osteopenia. Interval compression deformity of L5, likely remote in nature. Stable compression deformity of L1. No retropulsion. Aortic aneurysm NOS (ICD10-I71.9). Aortic Atherosclerosis (ICD10-I70.0). Electronically Signed   By: Fidela Salisbury M.D.   On: 10/30/2021 20:29   CT Chest Wo Contrast  Result Date: 10/30/2021 CLINICAL DATA:  Abdominal pain, acute, nonlocalized; Chest pain, nonspecific SOB, hypoxia. Unspecified abdominal pain, generalized weakness. EXAM: CT CHEST, ABDOMEN AND PELVIS WITHOUT CONTRAST TECHNIQUE: Multidetector CT imaging of the chest, abdomen and pelvis was performed following the standard protocol without IV contrast. RADIATION DOSE REDUCTION: This exam was performed according to the departmental dose-optimization program which includes automated exposure control, adjustment of the mA and/or kV according to patient size and/or use of iterative reconstruction technique. COMPARISON:  04/22/2017 FINDINGS: CT CHEST FINDINGS Cardiovascular: Moderate multi-vessel coronary artery calcification, predominantly within the left anterior descending and left  main coronary arteries. Extensive calcification of the mitral valve annulus. Calcification of the aortic valve leaflets noted. Mild-to-moderate global cardiomegaly appears stable since prior examination. Left subclavian dual lead pacemaker leads noted within the right atrium and right ventricle. No pericardial effusion. Central pulmonary arteries are enlarged in keeping with changes of pulmonary arterial hypertension. The thoracic aorta demonstrates moderate atherosclerotic calcification. Bovine arch anatomy noted. The ascending aorta is dilated measuring 4.2 cm in greatest  dimension (coronal image # 84/5, stable since prior examination. The descending thoracic aorta is of normal caliber. Mediastinum/Nodes: Asymmetric enlargement of the right thyroid gland is unchanged, likely anatomic variation given its stability over time. No pathologic thoracic adenopathy. The esophagus is unremarkable. Small hiatal hernia. Lungs/Pleura: The pulmonary venous vasculature is engorged centrally and there is subtle diffuse parenchymal ground-glass opacity demonstrating a basilar gradient which is suspicious for mild alveolar pulmonary edema, likely cardiogenic in nature. No pneumothorax or pleural effusion. Musculoskeletal: The osseous structures are diffusely osteopenic. No acute bone abnormality. No lytic or blastic bone lesion. CT ABDOMEN PELVIS FINDINGS Hepatobiliary: Cholelithiasis without pericholecystic inflammatory change noted. The liver is unremarkable. No intra or extrahepatic biliary ductal dilation. Pancreas: Unremarkable Spleen: Unremarkable Adrenals/Urinary Tract: The adrenal glands are unremarkable. The kidneys are normal in size and position. Simple cortical cyst noted within the lower pole the right kidney. Probable vascular calcifications within the renal hila bilaterally. The kidneys are otherwise unremarkable. The bladder is unremarkable. Stomach/Bowel: Surgical changes of abdominal peroneal resection and left  lower quadrant descending colostomy are identified. There is moderate stool seen throughout the colon with a rapid transition in the caliber of the colon at the stomal hiatus through the abdominal fascia, best seen on axial image # 88/2, suggesting a partial large bowel obstruction. There is a parastomal hernia associated with a single loop of mid jejunum within the hernia sac. This results in a mild partial small bowel obstruction with mildly dilated loops of small bowel upstream from the hernia though gas and fluid is seen within the more distal small bowel. The appendix is not visualized and is likely absent. No free intraperitoneal gas or fluid. Vascular/Lymphatic: Aortic atherosclerosis. No enlarged abdominal or pelvic lymph nodes. Reproductive: Status post hysterectomy. No adnexal masses. Other: None Musculoskeletal: Stable remote superior endplate fracture of L1. Mild interval compression deformity of L5, likely remote in nature without evidence of retropulsion. The osseous structures are diffusely osteopenic. No acute bone abnormality identified. IMPRESSION: Moderate multi-vessel coronary artery calcification. Stable cardiomegaly. Morphologic changes in keeping with pulmonary arterial hypertension. Mild diffuse, basilar predominant parenchymal ground-glass opacity suspicious for mild alveolar pulmonary edema, likely cardiogenic in nature. Calcification of the aortic valve leaflets. Echocardiography may be helpful to assess the degree of valvular dysfunction. Mild dilation of the ascending aorta with maximal diameter of 4.2 cm, stable since prior examination of 04/22/2017. Recommend annual imaging followup by CTA or MRA. This recommendation follows 2010 ACCF/AHA/AATS/ACR/ASA/SCA/SCAI/SIR/STS/SVM Guidelines for the Diagnosis and Management of Patients with Thoracic Aortic Disease. Circulation. 2010; 121: K440-N027. Aortic aneurysm NOS (ICD10-I71.9) Cholelithiasis. Surgical changes of abdominal peroneal  resection and descending colostomy. Moderate retained stool throughout the colon and rapid transition at the abdominal fascial hiatus suggests a partial large bowel obstruction. Superimposed parastomal hernia containing a single loop of small bowel with mild dilation of the upstream small bowel in keeping with a mild partial small bowel obstruction. Osteopenia. Interval compression deformity of L5, likely remote in nature. Stable compression deformity of L1. No retropulsion. Aortic aneurysm NOS (ICD10-I71.9). Aortic Atherosclerosis (ICD10-I70.0). Electronically Signed   By: Fidela Salisbury M.D.   On: 10/30/2021 20:29   DG Chest Port 1 View  Result Date: 10/30/2021 CLINICAL DATA:  Shortness of breath.  Abdominal pain and weakness. EXAM: PORTABLE CHEST 1 VIEW COMPARISON:  02/06/2020 FINDINGS: AICD noted. Low lung volumes are present, causing crowding of the pulmonary vasculature. Mild to moderate cardiomegaly. Indistinct left hemidiaphragm, this has been present on prior exams and accordingly may be configuration  ule but I cannot exclude a left lower lobe airspace opacity. Atherosclerotic calcification of the aortic arch. IMPRESSION: 1. Low lung volumes are present, causing crowding of the pulmonary vasculature. 2.  Aortic Atherosclerosis (ICD10-I70.0). 3. 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 Part of the left lung base is also covered by the patient's AICD pulse generator. Electronically Signed   By: Van Clines M.D.   On: 10/30/2021 17:18   ECHOCARDIOGRAM COMPLETE  Result Date: 10/31/2021    ECHOCARDIOGRAM REPORT   Patient Name:   [name] Date of Exam: 10/31/2021 Medical Rec #:  [id number]     Height:       66.0 in Accession #:    [id number]    Weight:       193.3 lb Date of Birth:   04-30-1934     BSA:          1.971 m Patient Age:    89 years      BP:           143/72 mmHg Patient Gender: F             HR:           96 bpm. Exam Location:  [facility] Procedure: 2D Echo, Color Doppler, Cardiac Doppler and Intracardiac            Opacification Agent Indications:    Congestive heart failure  History:        Patient has prior history of Echocardiogram examinations, most                 recent 02/03/2020. CHF and Cardiomyopathy; Defibrillator.  Sonographer:    Wenda Low Referring Phys: [id number] ASIA B [place]  1. Left ventricular ejection fraction, by estimation, is 45 to 50%. The left ventricle has mildly decreased function. The left ventricle demonstrates global hypokinesis. There is severe concentric left ventricular hypertrophy. Left ventricular diastolic  function could not be evaluated. Elevated left ventricular end-diastolic pressure.  2. Right ventricular systolic function is mildly reduced. The right ventricular size is mildly enlarged.  3. Left atrial size was severely dilated.  4. Right atrial size was moderately dilated.  5. There is heavy calcification of the medial aspect of the mitral valve annulus.. The mitral valve is degenerative. Mild mitral valve regurgitation. Severe mitral stenosis. Severe mitral annular calcification.  6. Tricuspid valve regurgitation is mild to moderate.  7. The aortic valve is calcified. There is severe calcifcation of the aortic valve. Aortic valve regurgitation is moderate. Mild aortic valve stenosis. Aortic valve area, by VTI measures 1.88 cm. Aortic valve mean gradient measures 7.0 mmHg. Aortic valve Vmax measures 1.89 m/s.  8. Aortic dilatation noted. There is mild dilatation of the ascending aorta, measuring 39 mm. FINDINGS  Left Ventricle: Left ventricular ejection fraction, by estimation, is 45 to 50%. The left ventricle has mildly decreased function. The left ventricle demonstrates global hypokinesis. The left ventricular  internal cavity size was normal in size. There is  severe concentric left ventricular hypertrophy. Abnormal (paradoxical) septal motion, consistent with left bundle branch block. Left ventricular diastolic function could not be evaluated. Elevated left ventricular end-diastolic pressure. Right Ventricle: The right ventricular size is mildly enlarged. No increase in right ventricular wall thickness. Right ventricular systolic function is mildly reduced.  Left Atrium: Left atrial size was severely dilated. Right Atrium: Right atrial size was moderately dilated. Pericardium: There is no evidence of pericardial effusion. Mitral Valve: There is heavy calcification of the medial aspect of the mitral valve annulus. The mitral valve is degenerative in appearance. There is mild thickening of the mitral valve leaflet(s). There is mild calcification of the mitral valve leaflet(s). Severe mitral annular calcification. Mild mitral valve regurgitation. Severe mitral valve stenosis. MV peak gradient, 28.9 mmHg. The mean mitral valve gradient is 11.0 mmHg. Tricuspid Valve: The tricuspid valve is normal in structure. Tricuspid valve regurgitation is mild to moderate. No evidence of tricuspid stenosis. Aortic Valve: The aortic valve is calcified. There is severe calcifcation of the aortic valve. Aortic valve regurgitation is moderate. Aortic regurgitation PHT measures 363 msec. Mild aortic stenosis is present. Aortic valve mean gradient measures 7.0 mmHg. Aortic valve peak gradient measures 14.3 mmHg. Aortic valve area, by VTI measures 1.88 cm. Pulmonic Valve: The pulmonic valve was normal in structure. Pulmonic valve regurgitation is mild. No evidence of pulmonic stenosis. Aorta: Aortic dilatation noted. There is mild dilatation of the ascending aorta, measuring 39 mm. Venous: The inferior vena cava was not well visualized. IAS/Shunts: No atrial level shunt detected by color flow Doppler. Additional Comments: A device lead is  visualized.  LEFT VENTRICLE PLAX 2D LVIDd:         3.90 cm   Diastology LVIDs:         2.90 cm   LV e' medial:    6.42 cm/s LV PW:         1.60 cm   LV E/e' medial:  38.2 LV IVS:        1.80 cm   LV e' lateral:   8.92 cm/s LVOT diam:     1.80 cm   LV E/e' lateral: 27.5 LV SV:         62 LV SV Index:   31 LVOT Area:     2.54 cm  RIGHT VENTRICLE RV Basal diam:  3.85 cm RV Mid diam:    3.70 cm LEFT ATRIUM              Index        RIGHT ATRIUM           Index LA diam:        4.10 cm  2.08 cm/m   RA Area:     20.60 cm LA Vol (A2C):   156.0 ml 79.14 ml/m  RA Volume:   70.10 ml  35.56 ml/m LA Vol (A4C):   119.0 ml 60.37 ml/m LA Biplane Vol: 148.0 ml 75.08 ml/m  AORTIC VALVE                     PULMONIC VALVE AV Area (Vmax):    1.75 cm      PV Vmax:       0.63 m/s AV Area (Vmean):   1.81 cm      PV Peak grad:  1.6 mmHg AV Area (VTI):     1.88 cm AV Vmax:           189.00 cm/s AV Vmean:          115.000 cm/s AV VTI:            0.331 m AV Peak Grad:      14.3 mmHg AV Mean Grad:      7.0 mmHg LVOT Vmax:  130.00 cm/s LVOT Vmean:        82.000 cm/s LVOT VTI:          0.244 m LVOT/AV VTI ratio: 0.74 AI PHT:            363 msec  AORTA Ao Root diam: 3.40 cm Ao Asc diam:  3.90 cm MITRAL VALVE                TRICUSPID VALVE MV Area (PHT): 5.31 cm     TR Peak grad:   42.0 mmHg MV Area VTI:   1.68 cm     TR Vmax:        324.00 cm/s MV Peak grad:  28.9 mmHg MV Mean grad:  11.0 mmHg    SHUNTS MV Vmax:       2.69 m/s     Systemic VTI:  0.24 m MV Vmean:      140.0 cm/s   Systemic Diam: 1.80 cm MV Decel Time: 143 msec MV E velocity: 245.00 cm/s Fransico Him MD Electronically signed by Fransico Him MD Signature Date/Time: 10/31/2021/11:50:25 AM    Final     Anti-infectives: Anti-infectives (From admission, onward)    Start     Dose/Rate Route Frequency Ordered Stop   10/30/21 1700  cefTRIAXone (ROCEPHIN) 2 g in sodium chloride 0.9 % 100 mL IVPB        2 g 200 mL/hr over 30 Minutes Intravenous  Once 10/30/21 1658  10/30/21 1819   10/30/21 1700  metroNIDAZOLE (FLAGYL) IVPB 500 mg        500 mg 100 mL/hr over 60 Minutes Intravenous  Once 10/30/21 1658 10/30/21 1950       Assessment/Plan: Patient is a an 88 year old female who was admitted in acute CHF exacerbation, and noted to have a partial small bowel obstruction and partial large bowel obstruction on CT abdomen and pelvis.  -Patient moving her bowels with minimal abdominal tenderness -No leukocytosis, 5 -Improving AKI 1.43 from 1.73 yesterday -No nausea and vomiting with clears -Will advance to FLD today -Appreciate SLP recommendations -No acute surgical intervention at this time -Continue Dulcolax suppositories and Colace -Simethicone ordered for bloating -Remainder of care per primary team   LOS: 2 days    Yug Loria A Breda Bond 11/01/2021

## 2021-11-01 NOTE — Progress Notes (Signed)
Speech Language Pathology Treatment: Dysphagia  Patient Details Name: [name] MRN: [id number] DOB: [birth year] Today's Date: 11/01/2021 Time: 6226-3335 SLP Time Calculation (min) (ACUTE ONLY): 26 min  Assessment / Plan / Recommendation Clinical Impression  Pt seen for ongoing dysphagia intervention following BSE completed yesterday. Pt seen sitting up in recliner. She has difficulty holding her head up and has some drooling/loss of saliva. Pt consumed thin liquids via straw sip and puree without overt signs of aspiration and no residuals. Pt with prolonged mastication of diced peaches, oral pocketing, residuals, and inability to transfer to posterior oral cavity to swallow and had to be expectorated. Pt says that she typically has trouble chewing solid foods and when asked if she preferred pureed, she said yes. Recommend D1/puree and thin liquids with feeder assist and po medications crushed as able in puree when Pt is alert and upright.     HPI HPI: [name]  is a 88 y.o. female, with history of asthma, CHF, colon cancer status post partial colectomy with descending colostomy, complete heart block, COPD, diabetes mellitus type 2, hyperlipidemia, hypertension, hypothyroidism presents ED with a chief complaint of right abdominal pain.  Patient reports that she has had abdominal pain in her right abdomen in the upper and lower quadrants for 2 days.  They are sharp pains.  She is not sure when her last ostomy output was because she does not pay attention to that per her report.  She says her last meal was today.  She reports no change in appetite.  She denies any dysuria, hematuria.  Patient reports that she has felt feverish at home, but does not know any measured temperature.  She reports she did have 1 episode of emesis on the morning of presentation.  She is not sure if there is blood in it because she does not pay attention to that.  Patient is oriented to self, place, and contacts.  She is not  oriented to time.  Patient has no O2 requirement at home.  Her oxygen sats did drop down into the 70s.  Patient reports that she does not feel short of breath.  She has not had a cough.  Patient is somnolent and further history is difficult to obtain.Admission requested for acute hypoxic respiratory failure and bowel obstruction. BSE requested.      SLP Plan  Continue with current plan of care      Recommendations for follow up therapy are one component of a multi-disciplinary discharge planning process, led by the attending physician.  Recommendations may be updated based on patient status, additional functional criteria and insurance authorization.    Recommendations  Diet recommendations: Dysphagia 1 (puree);Thin liquid Liquids provided via: Cup;Straw Medication Administration: Crushed with puree Supervision: Staff to assist with self feeding;Full supervision/cueing for compensatory strategies Compensations: Small sips/bites Postural Changes and/or Swallow Maneuvers: Seated upright 90 degrees;Upright 30-60 min after meal                Oral Care Recommendations: Staff/trained caregiver to provide oral care;Oral care BID Follow Up Recommendations:  (pending) Assistance recommended at discharge: Frequent or constant Supervision/Assistance SLP Visit Diagnosis: Dysphagia, unspecified (R13.10) Plan: Continue with current plan of care          Thank you,  Genene Churn, [place]  11/01/2021, 1:53 PM

## 2021-11-01 NOTE — Progress Notes (Signed)
PROGRESS NOTE    [name]  XTG:[id number] DOB: [birth year] DOA: 10/30/2021 PCP: Lovett Calender, PA-C    Brief Narrative:  This 88 years old female with PMH significant for asthma, CHF, colon cancer s/p partial colectomy with descending colostomy, complete heart block, COPD, diabetes mellitus type 2, hyperlipidemia, hypertension, hypothyroidism presented to the ED with c/o: right abdominal pain for 2 days.  She describes pain as sharp associated with the vomiting.  She was hypoxic on arrival with SPO2 70% on room air,  placed on 2 L of supplemental oxygen. Patient was somewhat somnolent and further history was obtained from ED chart.  Labs in the ED showed serum creatinine 2.02 consistent with AKI.  CT chest abdomen and pelvis showed multivessel CAD, cholelithiasis, descending colostomy, partial large bowel obstruction and partial small bowel obstruction.  Patient was kept NPO. admitted for partial small bowel obstruction.  General surgery was consulted,  Patient was found to have stool and gas in the colostomy bag, recommended clear liquid diet.  Diet advanced to Full liquid diet. Patient also been found to have acute CHF exacerbation requiring IV diuresis.  Last echocardiogram shows LVEF 40 to 45%.  Continue home medications for medical optimization.  Assessment & Plan:   Principal Problem:   SBO (small bowel obstruction) (HCC) Active Problems:   Acute CHF (congestive heart failure) (HCC)   Acute respiratory failure (HCC)   Hypothyroidism   Acute renal failure superimposed on stage 3a chronic kidney disease (HCC)  Partial small and large bowel obstruction: Patient presented with abdominal pain,  distention and vomiting. CT abdomen and pelvis showed partial large and small bowel obstruction. Patient was kept NPO, no NG tube was recommended. Continue adequate pain control with pain meds. General surgery consulted, found to have stool and gas through colostomy bag. Bowel obstruction is  resolving.  Patient tolerated clear liquid diet. Advanced to full liquids, advance as tolerated.  Acute combined systolic and diastolic CHF: Patient found to have bilateral leg edema, SOB on exertion, tachycardia , cardiomegaly and pulmonary edema on chest x-ray. Continue IV Lasix 40 mg every 12 hr Last echocardiogram LVEF 40 to 45%. Monitor daily weight, intake output charting. Continue cardioprotective medications aspirin, Coreg, Imdur, statin.  Acute hypoxic respiratory failure.: Secondary to CHF exacerbation. SPO2 70% on room air, improved to 92% on 2 L. Wean off as tolerated.  Patient was covered with Ceftriaxone. Antibiotic discontinued as procalcitonin negative.  Hypothyroidism: Continue levothyroxine.  AKI on CKD stage 3A: Serum creatinine 1.2, presented with serum creatinine 2.02. Hold nephrotoxic medications.  Monitor serum creatinine  DVT prophylaxis: Heparin Code Status: DNR Family Communication: Son at bed side Disposition Plan:  Status is: Inpatient  Remains inpatient appropriate because:  Admitted for partial small bowel obstruction, and CHF exacerbation.  Anticipated discharge to SNF in 1 to 2 days.  Consultants:   General Surgery  Procedures: Echocardiogram.  Antimicrobials:   Anti-infectives (From admission, onward)    Start     Dose/Rate Route Frequency Ordered Stop   10/30/21 1700  cefTRIAXone (ROCEPHIN) 2 g in sodium chloride 0.9 % 100 mL IVPB        2 g 200 mL/hr over 30 Minutes Intravenous  Once 10/30/21 1658 10/30/21 1819   10/30/21 1700  metroNIDAZOLE (FLAGYL) IVPB 500 mg        500 mg 100 mL/hr over 60 Minutes Intravenous  Once 10/30/21 1658 10/30/21 1950       Subjective: Patient was seen and examined at bedside.  Overnight events noted.   Patient sitting comfortably on the chair.  She reports passing gas, tolerated clear liquid diet. Patient remains on 2 L of supplemental oxygen maintain saturation.  Objective: Vitals:   10/31/21  2007 10/31/21 2123 11/01/21 0508 11/01/21 0803  BP:  (!) 137/57 131/61   Pulse:  (!) 102 96   Resp:  20 16   Temp:  99.4 F (37.4 C) 98.1 F (36.7 C)   TempSrc:      SpO2: 96% 98% 100% 98%  Weight:   87.8 kg   Height:        Intake/Output Summary (Last 24 hours) at 11/01/2021 1336 Last data filed at 11/01/2021 0930 Gross per 24 hour  Intake 1051.85 ml  Output --  Net 1051.85 ml   Filed Weights   10/31/21 0051 10/31/21 0500 11/01/21 0508  Weight: 87.7 kg 87.7 kg 87.8 kg    Examination:  General exam: Appears comfortable, chronically ill looking, not in any distress.   Neck:  Central line noted in the right IJ. Respiratory system: Clear to auscultation bilaterally, respiratory effort normal, RR 15. Cardiovascular system: S1-S2 heard, regular rate and rhythm, no murmur. Gastrointestinal system: Abdomen is soft, mildly distended, nontender, BS+, colostomy with brown stool noted. Central nervous system: Alert and oriented x 2 . No focal neurological deficits. Extremities: Edema+,  no cyanosis, no clubbing. Skin: No rashes, lesions or ulcers Psychiatry: Judgement and insight appear normal. Mood & affect appropriate.     Data Reviewed: I have personally reviewed following labs and imaging studies  CBC: Recent Labs  Lab 10/30/21 1732 10/31/21 0230 11/01/21 0538  WBC 6.8 6.2 5.0  NEUTROABS 5.0 4.5  --   HGB 11.1* 10.6* 10.1*  HCT 36.4 34.7* 33.3*  MCV 101.1* 100.0 101.5*  PLT 213 183 350   Basic Metabolic Panel: Recent Labs  Lab 10/30/21 1732 10/31/21 0230 11/01/21 0538  NA 138 138 141  K 4.4 4.0 4.2  CL 101 102 102  CO2 28 28 26   GLUCOSE 112* 102* 71  BUN 31* 31* 36*  CREATININE 2.02* 1.73* 1.43*  CALCIUM 9.0 8.8* 9.0  MG  --  2.3 2.2  PHOS  --   --  3.9   GFR: Estimated Creatinine Clearance: 32.1 mL/min (A) (by C-G formula based on SCr of 1.43 mg/dL (H)). Liver Function Tests: Recent Labs  Lab 10/30/21 1732 10/31/21 0230  AST 18 18  ALT 20 18   ALKPHOS 77 68  BILITOT 0.4 0.5  PROT 7.4 6.9  ALBUMIN 4.0 3.6   No results for input(s): LIPASE, AMYLASE in the last 168 hours. No results for input(s): AMMONIA in the last 168 hours. Coagulation Profile: Recent Labs  Lab 10/30/21 1732  INR 1.1   Cardiac Enzymes: No results for input(s): CKTOTAL, CKMB, CKMBINDEX, TROPONINI in the last 168 hours. BNP (last 3 results) No results for input(s): PROBNP in the last 8760 hours. HbA1C: No results for input(s): HGBA1C in the last 72 hours. CBG: Recent Labs  Lab 10/31/21 0556 10/31/21 0620  GLUCAP 78 145*   Lipid Profile: No results for input(s): CHOL, HDL, LDLCALC, TRIG, CHOLHDL, LDLDIRECT in the last 72 hours. Thyroid Function Tests: Recent Labs    10/31/21 0230  TSH 0.734   Anemia Panel: No results for input(s): VITAMINB12, FOLATE, FERRITIN, TIBC, IRON, RETICCTPCT in the last 72 hours. Sepsis Labs: Recent Labs  Lab 10/30/21 1732 10/30/21 1935 10/30/21 2314 10/31/21 0230 11/01/21 0538  PROCALCITON  --   --   --  0.19 0.22  LATICACIDVEN 0.9 2.1* 1.3 0.7  --     Recent Results (from the past 240 hour(s))  Blood Culture (routine x 2)     Status: None (Preliminary result)   Collection Time: 10/30/21  5:32 PM   Specimen: BLOOD  Result Value Ref Range Status   Specimen Description BLOOD REJ BOTTLES DRAWN AEROBIC AND ANAEROBIC  Final   Special Requests Blood Culture adequate volume  Final   Culture   Final    NO GROWTH 2 DAYS Performed at [facility], [address] [place], [place] [address]    Report Status PENDING  Incomplete  Blood Culture (routine x 2)     Status: None (Preliminary result)   Collection Time: 10/30/21  5:33 PM   Specimen: BLOOD LEFT FOREARM  Result Value Ref Range Status   Specimen Description   Final    BLOOD LEFT FOREARM BOTTLES DRAWN AEROBIC AND ANAEROBIC   Special Requests   Final    Blood Culture results may not be optimal due to an inadequate volume of blood received in culture bottles    Culture   Final    NO GROWTH 2 DAYS Performed at [facility], [address] [place], [place] [address]    Report Status PENDING  Incomplete  Resp Panel by RT-PCR (Flu A&B, Covid) Nasopharyngeal Swab     Status: None   Collection Time: 10/30/21  5:47 PM   Specimen: Nasopharyngeal Swab; Nasopharyngeal(NP) swabs in vial transport medium  Result Value Ref Range Status   SARS Coronavirus 2 by RT PCR NEGATIVE NEGATIVE Final    Comment: (NOTE) SARS-CoV-2 target nucleic acids are NOT DETECTED.  The SARS-CoV-2 RNA is generally detectable in upper respiratory specimens during the acute phase of infection. The lowest concentration of SARS-CoV-2 viral copies this assay can detect is 138 copies/mL. A negative result does not preclude SARS-Cov-2 infection and should not be used as the sole basis for treatment or other patient management decisions. A negative result may occur with  improper specimen collection/handling, submission of specimen other than nasopharyngeal swab, presence of viral mutation(s) within the areas targeted by this assay, and inadequate number of viral copies(<138 copies/mL). A negative result must be combined with clinical observations, patient history, and epidemiological information. The expected result is Negative.  Fact Sheet for Patients:  EntrepreneurPulse.com.au  Fact Sheet for Healthcare Providers:  IncredibleEmployment.be  This test is no t yet approved or cleared by the [place] FDA and  has been authorized for detection and/or diagnosis of SARS-CoV-2 by FDA under an Emergency Use Authorization (EUA). This EUA will remain  in effect (meaning this test can be used) for the duration of the COVID-19 declaration under Section 564(b)(1) of the Act, 21 U.S.C.section 360bbb-3(b)(1), unless the authorization is terminated  or revoked sooner.       Influenza A by PCR NEGATIVE NEGATIVE Final   Influenza B by PCR NEGATIVE  NEGATIVE Final    Comment: (NOTE) The Xpert Xpress SARS-CoV-2/FLU/RSV plus assay is intended as an aid in the diagnosis of influenza from Nasopharyngeal swab specimens and should not be used as a sole basis for treatment. Nasal washings and aspirates are unacceptable for Xpert Xpress SARS-CoV-2/FLU/RSV testing.  Fact Sheet for Patients: EntrepreneurPulse.com.au  Fact Sheet for Healthcare Providers: IncredibleEmployment.be  This test is not yet approved or cleared by the [place] FDA and has been authorized for detection and/or diagnosis of SARS-CoV-2 by FDA under an Emergency Use Authorization (EUA). This EUA will remain  in effect (meaning this test can be used) for the duration of the COVID-19 declaration under Section 564(b)(1) of the Act, 21 U.S.C. section 360bbb-3(b)(1), unless the authorization is terminated or revoked.  Performed at [facility], [address], [place], [place] [address]   MRSA Next Gen by PCR, Nasal     Status: None   Collection Time: 10/31/21  1:26 AM   Specimen: Nasal Mucosa; Nasal Swab  Result Value Ref Range Status   MRSA by PCR Next Gen NOT DETECTED NOT DETECTED Final    Comment: (NOTE) The GeneXpert MRSA Assay (FDA approved for NASAL specimens only), is one component of a comprehensive MRSA colonization surveillance program. It is not intended to diagnose MRSA infection nor to guide or monitor treatment for MRSA infections. Test performance is not FDA approved in patients less than 31 years old. Performed at [facility], [address] [place], [place] [address]   Urine Culture     Status: None   Collection Time: 10/31/21  3:06 AM   Specimen: Urine, Catheterized  Result Value Ref Range Status   Specimen Description   Final    URINE, CATHETERIZED Performed at [facility], [address], [place], [place] [address]    Special Requests   Final    NONE Performed at [facility], [address]  [place], [place] [address]    Culture   Final    NO GROWTH Performed at [facility], [place] [address], [place], [place] [address]    Report Status 11/01/2021 FINAL  Final    Radiology Studies: CT ABDOMEN PELVIS WO CONTRAST  Result Date: 10/30/2021 CLINICAL DATA:  Abdominal pain, acute, nonlocalized; Chest pain, nonspecific SOB, hypoxia. Unspecified abdominal pain, generalized weakness. EXAM: CT CHEST, ABDOMEN AND PELVIS WITHOUT CONTRAST TECHNIQUE: Multidetector CT imaging of the chest, abdomen and pelvis was performed following the standard protocol without IV contrast. RADIATION DOSE REDUCTION: This exam was performed according to the departmental dose-optimization program which includes automated exposure control, adjustment of the mA and/or kV according to patient size and/or use of iterative reconstruction technique. COMPARISON:  04/22/2017 FINDINGS: CT CHEST FINDINGS Cardiovascular: Moderate multi-vessel coronary artery calcification, predominantly within the left anterior descending and left main coronary arteries. Extensive calcification of the mitral valve annulus. Calcification of the aortic valve leaflets noted. Mild-to-moderate global cardiomegaly appears stable since prior examination. Left subclavian dual lead pacemaker leads noted within the right atrium and right ventricle. No pericardial effusion. Central pulmonary arteries are enlarged in keeping with changes of pulmonary arterial hypertension. The thoracic aorta demonstrates moderate atherosclerotic calcification. Bovine arch anatomy noted. The ascending aorta is dilated measuring 4.2 cm in greatest dimension (coronal image # 84/5, stable since prior examination. The descending thoracic aorta is of normal caliber. Mediastinum/Nodes: Asymmetric enlargement of the right thyroid gland is unchanged, likely anatomic variation given its stability over time. No pathologic thoracic adenopathy. The esophagus is unremarkable. Small hiatal hernia.  Lungs/Pleura: The pulmonary venous vasculature is engorged centrally and there is subtle diffuse parenchymal ground-glass opacity demonstrating a basilar gradient which is suspicious for mild alveolar pulmonary edema, likely cardiogenic in nature. No pneumothorax or pleural effusion. Musculoskeletal: The osseous structures are diffusely osteopenic. No acute bone abnormality. No lytic or blastic bone lesion. CT ABDOMEN PELVIS FINDINGS Hepatobiliary: Cholelithiasis without pericholecystic inflammatory change noted. The liver is unremarkable. No intra or extrahepatic biliary ductal dilation. Pancreas: Unremarkable Spleen: Unremarkable Adrenals/Urinary Tract: The adrenal glands are unremarkable. The kidneys are normal in size and position. Simple cortical  cyst noted within the lower pole the right kidney. Probable vascular calcifications within the renal hila bilaterally. The kidneys are otherwise unremarkable. The bladder is unremarkable. Stomach/Bowel: Surgical changes of abdominal peroneal resection and left lower quadrant descending colostomy are identified. There is moderate stool seen throughout the colon with a rapid transition in the caliber of the colon at the stomal hiatus through the abdominal fascia, best seen on axial image # 88/2, suggesting a partial large bowel obstruction. There is a parastomal hernia associated with a single loop of mid jejunum within the hernia sac. This results in a mild partial small bowel obstruction with mildly dilated loops of small bowel upstream from the hernia though gas and fluid is seen within the more distal small bowel. The appendix is not visualized and is likely absent. No free intraperitoneal gas or fluid. Vascular/Lymphatic: Aortic atherosclerosis. No enlarged abdominal or pelvic lymph nodes. Reproductive: Status post hysterectomy. No adnexal masses. Other: None Musculoskeletal: Stable remote superior endplate fracture of L1. Mild interval compression deformity of L5,  likely remote in nature without evidence of retropulsion. The osseous structures are diffusely osteopenic. No acute bone abnormality identified. IMPRESSION: Moderate multi-vessel coronary artery calcification. Stable cardiomegaly. Morphologic changes in keeping with pulmonary arterial hypertension. Mild diffuse, basilar predominant parenchymal ground-glass opacity suspicious for mild alveolar pulmonary edema, likely cardiogenic in nature. Calcification of the aortic valve leaflets. Echocardiography may be helpful to assess the degree of valvular dysfunction. Mild dilation of the ascending aorta with maximal diameter of 4.2 cm, stable since prior examination of 04/22/2017. Recommend annual imaging followup by CTA or MRA. This recommendation follows 2010 ACCF/AHA/AATS/ACR/ASA/SCA/SCAI/SIR/STS/SVM Guidelines for the Diagnosis and Management of Patients with Thoracic Aortic Disease. Circulation. 2010; 121: Q229-N989. Aortic aneurysm NOS (ICD10-I71.9) Cholelithiasis. Surgical changes of abdominal peroneal resection and descending colostomy. Moderate retained stool throughout the colon and rapid transition at the abdominal fascial hiatus suggests a partial large bowel obstruction. Superimposed parastomal hernia containing a single loop of small bowel with mild dilation of the upstream small bowel in keeping with a mild partial small bowel obstruction. Osteopenia. Interval compression deformity of L5, likely remote in nature. Stable compression deformity of L1. No retropulsion. Aortic aneurysm NOS (ICD10-I71.9). Aortic Atherosclerosis (ICD10-I70.0). Electronically Signed   By: Fidela Salisbury M.D.   On: 10/30/2021 20:29   CT Chest Wo Contrast  Result Date: 10/30/2021 CLINICAL DATA:  Abdominal pain, acute, nonlocalized; Chest pain, nonspecific SOB, hypoxia. Unspecified abdominal pain, generalized weakness. EXAM: CT CHEST, ABDOMEN AND PELVIS WITHOUT CONTRAST TECHNIQUE: Multidetector CT imaging of the chest, abdomen and  pelvis was performed following the standard protocol without IV contrast. RADIATION DOSE REDUCTION: This exam was performed according to the departmental dose-optimization program which includes automated exposure control, adjustment of the mA and/or kV according to patient size and/or use of iterative reconstruction technique. COMPARISON:  04/22/2017 FINDINGS: CT CHEST FINDINGS Cardiovascular: Moderate multi-vessel coronary artery calcification, predominantly within the left anterior descending and left main coronary arteries. Extensive calcification of the mitral valve annulus. Calcification of the aortic valve leaflets noted. Mild-to-moderate global cardiomegaly appears stable since prior examination. Left subclavian dual lead pacemaker leads noted within the right atrium and right ventricle. No pericardial effusion. Central pulmonary arteries are enlarged in keeping with changes of pulmonary arterial hypertension. The thoracic aorta demonstrates moderate atherosclerotic calcification. Bovine arch anatomy noted. The ascending aorta is dilated measuring 4.2 cm in greatest dimension (coronal image # 84/5, stable since prior examination. The descending thoracic aorta is of normal caliber. Mediastinum/Nodes: Asymmetric enlargement of  the right thyroid gland is unchanged, likely anatomic variation given its stability over time. No pathologic thoracic adenopathy. The esophagus is unremarkable. Small hiatal hernia. Lungs/Pleura: The pulmonary venous vasculature is engorged centrally and there is subtle diffuse parenchymal ground-glass opacity demonstrating a basilar gradient which is suspicious for mild alveolar pulmonary edema, likely cardiogenic in nature. No pneumothorax or pleural effusion. Musculoskeletal: The osseous structures are diffusely osteopenic. No acute bone abnormality. No lytic or blastic bone lesion. CT ABDOMEN PELVIS FINDINGS Hepatobiliary: Cholelithiasis without pericholecystic inflammatory change  noted. The liver is unremarkable. No intra or extrahepatic biliary ductal dilation. Pancreas: Unremarkable Spleen: Unremarkable Adrenals/Urinary Tract: The adrenal glands are unremarkable. The kidneys are normal in size and position. Simple cortical cyst noted within the lower pole the right kidney. Probable vascular calcifications within the renal hila bilaterally. The kidneys are otherwise unremarkable. The bladder is unremarkable. Stomach/Bowel: Surgical changes of abdominal peroneal resection and left lower quadrant descending colostomy are identified. There is moderate stool seen throughout the colon with a rapid transition in the caliber of the colon at the stomal hiatus through the abdominal fascia, best seen on axial image # 88/2, suggesting a partial large bowel obstruction. There is a parastomal hernia associated with a single loop of mid jejunum within the hernia sac. This results in a mild partial small bowel obstruction with mildly dilated loops of small bowel upstream from the hernia though gas and fluid is seen within the more distal small bowel. The appendix is not visualized and is likely absent. No free intraperitoneal gas or fluid. Vascular/Lymphatic: Aortic atherosclerosis. No enlarged abdominal or pelvic lymph nodes. Reproductive: Status post hysterectomy. No adnexal masses. Other: None Musculoskeletal: Stable remote superior endplate fracture of L1. Mild interval compression deformity of L5, likely remote in nature without evidence of retropulsion. The osseous structures are diffusely osteopenic. No acute bone abnormality identified. IMPRESSION: Moderate multi-vessel coronary artery calcification. Stable cardiomegaly. Morphologic changes in keeping with pulmonary arterial hypertension. Mild diffuse, basilar predominant parenchymal ground-glass opacity suspicious for mild alveolar pulmonary edema, likely cardiogenic in nature. Calcification of the aortic valve leaflets. Echocardiography may be  helpful to assess the degree of valvular dysfunction. Mild dilation of the ascending aorta with maximal diameter of 4.2 cm, stable since prior examination of 04/22/2017. Recommend annual imaging followup by CTA or MRA. This recommendation follows 2010 ACCF/AHA/AATS/ACR/ASA/SCA/SCAI/SIR/STS/SVM Guidelines for the Diagnosis and Management of Patients with Thoracic Aortic Disease. Circulation. 2010; 121: R154-M086. Aortic aneurysm NOS (ICD10-I71.9) Cholelithiasis. Surgical changes of abdominal peroneal resection and descending colostomy. Moderate retained stool throughout the colon and rapid transition at the abdominal fascial hiatus suggests a partial large bowel obstruction. Superimposed parastomal hernia containing a single loop of small bowel with mild dilation of the upstream small bowel in keeping with a mild partial small bowel obstruction. Osteopenia. Interval compression deformity of L5, likely remote in nature. Stable compression deformity of L1. No retropulsion. Aortic aneurysm NOS (ICD10-I71.9). Aortic Atherosclerosis (ICD10-I70.0). Electronically Signed   By: Fidela Salisbury M.D.   On: 10/30/2021 20:29   DG Chest Port 1 View  Result Date: 10/30/2021 CLINICAL DATA:  Shortness of breath.  Abdominal pain and weakness. EXAM: PORTABLE CHEST 1 VIEW COMPARISON:  02/06/2020 FINDINGS: AICD noted. Low lung volumes are present, causing crowding of the pulmonary vasculature. Mild to moderate cardiomegaly. Indistinct left hemidiaphragm, this has been present on prior exams and accordingly may be configuration ule but I cannot exclude a left lower lobe airspace opacity. Atherosclerotic calcification of the aortic arch. IMPRESSION: 1. Low lung  volumes are present, causing crowding of the pulmonary vasculature. 2.  Aortic Atherosclerosis (ICD10-I70.0). 3. Indistinct left hemidiaphragm, some of this is chronic and may be related to the configuration of the hemidiaphragm as well as the underlying cardiomegaly, but a left  lower lobe airspace opacity such as pneumonia or atelectasis cannot be readily excluded. Part of the left lung base is also covered by the patient's AICD pulse generator. Electronically Signed   By: Van Clines M.D.   On: 10/30/2021 17:18   ECHOCARDIOGRAM COMPLETE  Result Date: 10/31/2021    ECHOCARDIOGRAM REPORT   Patient Name:   [name] Date of Exam: 10/31/2021 Medical Rec #:  [id number]     Height:       66.0 in Accession #:    [id number]    Weight:       193.3 lb Date of Birth:  August 01, 1934     BSA:          1.971 m Patient Age:    80 years      BP:           143/72 mmHg Patient Gender: F             HR:           96 bpm. Exam Location:  [facility] Procedure: 2D Echo, Color Doppler, Cardiac Doppler and Intracardiac            Opacification Agent Indications:    Congestive heart failure  History:        Patient has prior history of Echocardiogram examinations, most                 recent 02/03/2020. CHF and Cardiomyopathy; Defibrillator.  Sonographer:    Wenda Low Referring Phys: [id number] ASIA B [place]  1. Left ventricular ejection fraction, by estimation, is 45 to 50%. The left ventricle has mildly decreased function. The left ventricle demonstrates global hypokinesis. There is severe concentric left ventricular hypertrophy. Left ventricular diastolic  function could not be evaluated. Elevated left ventricular end-diastolic pressure.  2. Right ventricular systolic function is mildly reduced. The right ventricular size is mildly enlarged.  3. Left atrial size was severely dilated.  4. Right atrial size was moderately dilated.  5. There is heavy calcification of the medial aspect of the mitral valve annulus.. The mitral valve is degenerative. Mild mitral valve regurgitation. Severe mitral stenosis. Severe mitral annular calcification.  6. Tricuspid valve regurgitation is mild to moderate.  7. The aortic valve is calcified. There is severe calcifcation of the aortic valve.  Aortic valve regurgitation is moderate. Mild aortic valve stenosis. Aortic valve area, by VTI measures 1.88 cm. Aortic valve mean gradient measures 7.0 mmHg. Aortic valve Vmax measures 1.89 m/s.  8. Aortic dilatation noted. There is mild dilatation of the ascending aorta, measuring 39 mm. FINDINGS  Left Ventricle: Left ventricular ejection fraction, by estimation, is 45 to 50%. The left ventricle has mildly decreased function. The left ventricle demonstrates global hypokinesis. The left ventricular internal cavity size was normal in size. There is  severe concentric left ventricular hypertrophy. Abnormal (paradoxical) septal motion, consistent with left bundle branch block. Left ventricular diastolic function could not be evaluated. Elevated left ventricular end-diastolic pressure. Right Ventricle: The right ventricular size is mildly enlarged. No increase in right ventricular wall thickness. Right ventricular systolic function is mildly reduced. Left Atrium: Left atrial size was severely dilated. Right Atrium: Right atrial size was moderately dilated. Pericardium: There is no evidence  of pericardial effusion. Mitral Valve: There is heavy calcification of the medial aspect of the mitral valve annulus. The mitral valve is degenerative in appearance. There is mild thickening of the mitral valve leaflet(s). There is mild calcification of the mitral valve leaflet(s). Severe mitral annular calcification. Mild mitral valve regurgitation. Severe mitral valve stenosis. MV peak gradient, 28.9 mmHg. The mean mitral valve gradient is 11.0 mmHg. Tricuspid Valve: The tricuspid valve is normal in structure. Tricuspid valve regurgitation is mild to moderate. No evidence of tricuspid stenosis. Aortic Valve: The aortic valve is calcified. There is severe calcifcation of the aortic valve. Aortic valve regurgitation is moderate. Aortic regurgitation PHT measures 363 msec. Mild aortic stenosis is present. Aortic valve mean gradient  measures 7.0 mmHg. Aortic valve peak gradient measures 14.3 mmHg. Aortic valve area, by VTI measures 1.88 cm. Pulmonic Valve: The pulmonic valve was normal in structure. Pulmonic valve regurgitation is mild. No evidence of pulmonic stenosis. Aorta: Aortic dilatation noted. There is mild dilatation of the ascending aorta, measuring 39 mm. Venous: The inferior vena cava was not well visualized. IAS/Shunts: No atrial level shunt detected by color flow Doppler. Additional Comments: A device lead is visualized.  LEFT VENTRICLE PLAX 2D LVIDd:         3.90 cm   Diastology LVIDs:         2.90 cm   LV e' medial:    6.42 cm/s LV PW:         1.60 cm   LV E/e' medial:  38.2 LV IVS:        1.80 cm   LV e' lateral:   8.92 cm/s LVOT diam:     1.80 cm   LV E/e' lateral: 27.5 LV SV:         62 LV SV Index:   31 LVOT Area:     2.54 cm  RIGHT VENTRICLE RV Basal diam:  3.85 cm RV Mid diam:    3.70 cm LEFT ATRIUM              Index        RIGHT ATRIUM           Index LA diam:        4.10 cm  2.08 cm/m   RA Area:     20.60 cm LA Vol (A2C):   156.0 ml 79.14 ml/m  RA Volume:   70.10 ml  35.56 ml/m LA Vol (A4C):   119.0 ml 60.37 ml/m LA Biplane Vol: 148.0 ml 75.08 ml/m  AORTIC VALVE                     PULMONIC VALVE AV Area (Vmax):    1.75 cm      PV Vmax:       0.63 m/s AV Area (Vmean):   1.81 cm      PV Peak grad:  1.6 mmHg AV Area (VTI):     1.88 cm AV Vmax:           189.00 cm/s AV Vmean:          115.000 cm/s AV VTI:            0.331 m AV Peak Grad:      14.3 mmHg AV Mean Grad:      7.0 mmHg LVOT Vmax:         130.00 cm/s LVOT Vmean:        82.000 cm/s LVOT VTI:  0.244 m LVOT/AV VTI ratio: 0.74 AI PHT:            363 msec  AORTA Ao Root diam: 3.40 cm Ao Asc diam:  3.90 cm MITRAL VALVE                TRICUSPID VALVE MV Area (PHT): 5.31 cm     TR Peak grad:   42.0 mmHg MV Area VTI:   1.68 cm     TR Vmax:        324.00 cm/s MV Peak grad:  28.9 mmHg MV Mean grad:  11.0 mmHg    SHUNTS MV Vmax:       2.69 m/s      Systemic VTI:  0.24 m MV Vmean:      140.0 cm/s   Systemic Diam: 1.80 cm MV Decel Time: 143 msec MV E velocity: 245.00 cm/s Fransico Him MD Electronically signed by Fransico Him MD Signature Date/Time: 10/31/2021/11:50:25 AM    Final      Scheduled Meds:  aspirin EC  81 mg Oral Daily   atorvastatin  20 mg Oral QHS   bisacodyl  10 mg Rectal Daily   carvedilol  6.25 mg Oral BID WC   docusate sodium  100 mg Oral BID   famotidine  20 mg Oral Daily   furosemide  40 mg Intravenous BID   heparin  5,000 Units Subcutaneous Q8H   hydrALAZINE  50 mg Oral TID   iron polysaccharides  150 mg Oral BID   isosorbide mononitrate  60 mg Oral Daily   levothyroxine  50 mcg Oral Q0600   loratadine  10 mg Oral Daily   mometasone-formoterol  2 puff Inhalation BID   Continuous Infusions:   LOS: 2 days    Time spent: 50 mins    Sulamita Lafountain, MD [facility]   If 7PM-7AM, please contact night-coverage

## 2021-11-01 NOTE — TOC Progression Note (Signed)
Transition of Care [facility]) - Progression Note    Patient Details  Name: KAYLER BUCKHOLTZ MRN: [id number] Date of Birth: 13-Jan-1934  Transition of Care [facility]) CM/SW Contact  Shade Flood, LCSW Phone Number: 11/01/2021, 1:16 PM  Clinical Narrative:     TOC following. Per MD, pt may be stable for dc back to ALF tomorrow. Contacted Ginger at [facility] to update. Per Ginger, their transport driver is available between 1-230 tomorrow for transport. Pt is currently on 2L O2 and she is not on O2 at baseline.  Will follow up in AM to further assess for O2 dc needs and with needs related to transition back to ALF.  Expected Discharge Plan: Assisted Living Barriers to Discharge: Continued Medical Work up  Expected Discharge Plan and Services Expected Discharge Plan: Assisted Living In-house Referral: Clinical Social Work     Living arrangements for the past 2 months: [place]                                       Social Determinants of Health (SDOH) Interventions    Readmission Risk Interventions Readmission Risk Prevention Plan 11/01/2021 04/24/2019 04/21/2019  Transportation Screening Complete - Complete  HRI or Home Care Consult Complete - Complete  Social Work Consult for [place] Planning/Counseling Complete - Complete  Palliative Care Screening Not Applicable Not Applicable -  Medication Review Press photographer) Complete - -  Some recent data might be hidden

## 2021-11-02 DIAGNOSIS — K56609 Unspecified intestinal obstruction, unspecified as to partial versus complete obstruction: Secondary | ICD-10-CM | POA: Diagnosis not present

## 2021-11-02 LAB — CBC
HCT: 33.1 % — ABNORMAL LOW (ref 36.0–46.0)
Hemoglobin: 9.6 g/dL — ABNORMAL LOW (ref 12.0–15.0)
MCH: 29.4 pg (ref 26.0–34.0)
MCHC: 29 g/dL — ABNORMAL LOW (ref 30.0–36.0)
MCV: 101.2 fL — ABNORMAL HIGH (ref 80.0–100.0)
Platelets: 174 10*3/uL (ref 150–400)
RBC: 3.27 MIL/uL — ABNORMAL LOW (ref 3.87–5.11)
RDW: 14.7 % (ref 11.5–15.5)
WBC: 4.4 10*3/uL (ref 4.0–10.5)
nRBC: 0 % (ref 0.0–0.2)

## 2021-11-02 LAB — BASIC METABOLIC PANEL
Anion gap: 8 (ref 5–15)
BUN: 34 mg/dL — ABNORMAL HIGH (ref 8–23)
CO2: 31 mmol/L (ref 22–32)
Calcium: 8.8 mg/dL — ABNORMAL LOW (ref 8.9–10.3)
Chloride: 99 mmol/L (ref 98–111)
Creatinine, Ser: 1.24 mg/dL — ABNORMAL HIGH (ref 0.44–1.00)
GFR, Estimated: 43 mL/min — ABNORMAL LOW (ref >60)
Glucose, Bld: 83 mg/dL (ref 70–99)
Potassium: 3.7 mmol/L (ref 3.5–5.1)
Sodium: 138 mmol/L (ref 135–145)

## 2021-11-02 LAB — PHOSPHORUS: Phosphorus: 3.2 mg/dL (ref 2.5–4.6)

## 2021-11-02 LAB — MAGNESIUM: Magnesium: 2.1 mg/dL (ref 1.7–2.4)

## 2021-11-02 NOTE — [place] FL2 (Signed)
[place] MEDICAID FL2 LEVEL OF CARE SCREENING TOOL     IDENTIFICATION  Patient Name: [name] Birthdate: 11-25-1933 Sex: female Admission Date (Current Location): 10/30/2021  [facility] and [place] Number:  [employer] and Address:  [facility] [address] [place]      Provider Number: [id number]  Attending Physician Name and Address:  Phillips Grout, MD  Relative Name and Phone Number:       Current Level of Care: Hospital Recommended Level of Care: [place] Prior Approval Number:    Date Approved/Denied:   PASRR Number:    Discharge Plan: Other (Comment) (ALF)    Current Diagnoses: Patient Active Problem List   Diagnosis Date Noted   Acute renal failure superimposed on stage 3a chronic kidney disease ([place]) 03/13/2021   Vertigo 03/13/2021   Chronic combined systolic and diastolic CHF (congestive heart failure) ([place]) 04/18/2019   Personal history of gout 08/28/2018   Recurrent parastomal hernia at [facility] colostomy 08/21/2018   SBO (small bowel obstruction) ([place]) 08/21/2018   Chest pain 10/28/2017   Aspiration of liquid 10/28/2017   Bilateral lower extremity edema    Pneumonia 04/22/2017   Altered mental status    Hypotension 02/16/2017   Syncope 02/02/2016   Faintness    Hypothyroidism 11/16/2015   Acute on chronic combined systolic and diastolic CHF  81/10/7508   Hypertensive heart disease 10/04/2015   Elevated troponin 10/04/2015   Acute CHF (congestive heart failure) ([place]) 10/01/2015   Acute respiratory failure (HCC)    NICM (nonischemic cardiomyopathy) ([place]) 08/11/2015   Normal coronary arteries 08/11/2015   Dyslipidemia 07/27/2015   CKD (chronic kidney disease) stage 3, GFR 30-59 ml/min 07/27/2015   Anemia 07/27/2015   FUO (fever of unknown origin) 07/27/2015   IDA (iron deficiency anemia) 07/27/2015   Rectal cancer ypTypN0 (0/13) s/p chemoXRT/ APR/colostomy 2006 07/27/2015   Sepsis ([place])  07/24/2015   Acute encephalopathy 07/24/2015   Hypokalemia 07/24/2015   Dementia ([place]) 07/23/2015   COPD (chronic obstructive pulmonary disease) ([place]) 04/25/2015   ICD in place 04/14/2015   Colostomy in place [facility]) 03/01/2015   Pacemaker 11/07/2012   Anxiety 10/04/2011    Orientation RESPIRATION BLADDER Height & Weight     Self, Time, Situation, Place  Normal Continent Weight: 189 lb 9.5 oz (86 kg) Height:  5\' 6"  (167.6 cm)  BEHAVIORAL SYMPTOMS/MOOD NEUROLOGICAL BOWEL NUTRITION STATUS      Colostomy Diet (No added table salt)  AMBULATORY STATUS COMMUNICATION OF NEEDS Skin   Independent (with rollator) Verbally Normal                       Personal Care Assistance Level of Assistance  Bathing, Feeding, Dressing Bathing Assistance: Limited assistance Feeding assistance: Independent Dressing Assistance: Limited assistance     Functional Limitations Info  Sight, Hearing, Speech Sight Info: Adequate Hearing Info: Adequate Speech Info: Adequate    SPECIAL CARE FACTORS FREQUENCY                       Contractures Contractures Info: Not present    Additional Factors Info  Code Status, Allergies Code Status Info: DNR Allergies Info: NKA           Current Medications (11/02/2021):  This is the current hospital active medication list Current Facility-Administered Medications  Medication Dose Route Frequency Provider Last Rate Last Admin   acetaminophen (TYLENOL) tablet 650 mg  650 mg  Oral Q6H PRN Zierle-Ghosh, Asia B, DO   650 mg at 10/31/21 0107   Or   acetaminophen (TYLENOL) suppository 650 mg  650 mg Rectal Q6H PRN Zierle-Ghosh, Asia B, DO       aspirin EC tablet 81 mg  81 mg Oral Daily Zierle-Ghosh, Asia B, DO   81 mg at 11/02/21 0931   atorvastatin (LIPITOR) tablet 20 mg  20 mg Oral QHS Zierle-Ghosh, Asia B, DO   20 mg at 11/01/21 2322   bisacodyl (DULCOLAX) suppository 10 mg  10 mg Rectal Daily Pappayliou, Catherine A, DO   10 mg at 11/02/21 0932    carvedilol (COREG) tablet 6.25 mg  6.25 mg Oral BID WC Zierle-Ghosh, Asia B, DO   6.25 mg at 11/02/21 0931   docusate sodium (COLACE) capsule 100 mg  100 mg Oral BID Pappayliou, Catherine A, DO   100 mg at 11/02/21 0931   famotidine (PEPCID) tablet 20 mg  20 mg Oral Daily Zierle-Ghosh, Asia B, DO   20 mg at 11/02/21 0931   furosemide (LASIX) injection 40 mg  40 mg Intravenous BID Zierle-Ghosh, Asia B, DO   40 mg at 11/02/21 0932   heparin injection 5,000 Units  5,000 Units Subcutaneous Q8H Zierle-Ghosh, Asia B, DO   5,000 Units at 11/02/21 0445   hydrALAZINE (APRESOLINE) tablet 50 mg  50 mg Oral TID Zierle-Ghosh, Asia B, DO   50 mg at 11/02/21 0931   iron polysaccharides (NIFEREX) capsule 150 mg  150 mg Oral BID Zierle-Ghosh, Asia B, DO   150 mg at 11/02/21 0931   isosorbide mononitrate (IMDUR) 24 hr tablet 60 mg  60 mg Oral Daily Zierle-Ghosh, Asia B, DO   60 mg at 11/02/21 0931   levothyroxine (SYNTHROID) tablet 50 mcg  50 mcg Oral Q0600 Zierle-Ghosh, Asia B, DO   50 mcg at 11/02/21 0445   loratadine (CLARITIN) tablet 10 mg  10 mg Oral Daily Zierle-Ghosh, Asia B, DO   10 mg at 11/02/21 0931   meclizine (ANTIVERT) tablet 25 mg  25 mg Oral Q8H PRN Zierle-Ghosh, Asia B, DO       mometasone-formoterol (DULERA) 200-5 MCG/ACT inhaler 2 puff  2 puff Inhalation BID Zierle-Ghosh, Asia B, DO   2 puff at 11/02/21 0806   morphine 2 MG/ML injection 2 mg  2 mg Intravenous Q2H PRN Zierle-Ghosh, Asia B, DO       ondansetron (ZOFRAN) tablet 4 mg  4 mg Oral Q6H PRN Zierle-Ghosh, Asia B, DO       Or   ondansetron (ZOFRAN) injection 4 mg  4 mg Intravenous Q6H PRN Zierle-Ghosh, Asia B, DO   4 mg at 10/31/21 0106   oxyCODONE (Oxy IR/ROXICODONE) immediate release tablet 5 mg  5 mg Oral Q4H PRN Zierle-Ghosh, Asia B, DO   5 mg at 11/01/21 0533   simethicone (MYLICON) chewable tablet 80 mg  80 mg Oral Q6H PRN Pappayliou, Catherine A, DO       sodium chloride (OCEAN) 0.65 % nasal spray 1 spray  1 spray Each Nare Q12H PRN  Zierle-Ghosh, Asia B, DO         Discharge Medications:  STOP taking these medications     BENGAY ARTHRITIS FORMULA EX    guaifenesin 100 MG/5ML syrup Commonly known as: ROBITUSSIN    loperamide 2 MG capsule Commonly known as: IMODIUM    magnesium hydroxide 400 MG/5ML suspension Commonly known as: MILK OF MAGNESIA    traMADol 50 MG tablet Commonly known as:  ULTRAM    Triple Antibiotic [phone] Oint           TAKE these medications     acetaminophen 500 MG tablet Commonly known as: TYLENOL Take 500-1,000 mg by mouth See admin instructions. Take 2 tablets (1000 mg) by mouth every morning, may also take 1 tablet (500  mg) every 6 hours as needed for fever 99.5-101 F, minor headache, minor discomfort    alum & mag hydroxide-simeth 200-200-20 MG/5ML suspension Commonly known as: MAALOX/MYLANTA Take 30 mLs by mouth every 6 (six) hours as needed for heartburn or indigestion.    aspirin EC 81 MG tablet Take 1 tablet (81 mg total) by mouth every morning.    atorvastatin 20 MG tablet Commonly known as: LIPITOR Take 20 mg by mouth at bedtime.    carboxymethylcellulose 1 % ophthalmic solution Place 1 drop into both eyes 3 (three) times daily.    carvedilol 6.25 MG tablet Commonly known as: COREG Take 6.25 mg by mouth 2 (two) times daily with a meal.    cyclobenzaprine 5 MG tablet Commonly known as: FLEXERIL Take 5 mg by mouth at bedtime.    famotidine 20 MG tablet Commonly known as: PEPCID Take 1 tablet (20 mg total) by mouth daily.    fluticasone-salmeterol 500-50 MCG/ACT Aepb Commonly known as: ADVAIR Inhale 1 puff into the lungs in the morning and at bedtime.    furosemide 40 MG tablet Commonly known as: LASIX Take 40 mg by mouth daily.    hydrALAZINE 50 MG tablet Commonly known as: APRESOLINE Take 1 tablet (50 mg total) by mouth 3 (three) times daily.    iron polysaccharides 150 MG capsule Commonly known as: NIFEREX Take 150 mg by mouth 2 (two)  times daily.    isosorbide mononitrate 60 MG 24 hr tablet Commonly known as: IMDUR Take 1 tablet (60 mg total) by mouth daily.    levothyroxine 50 MCG tablet Commonly known as: SYNTHROID Take 50 mcg by mouth daily at 6 (six) AM.    loratadine 10 MG tablet Commonly known as: CLARITIN Take 10 mg by mouth daily.    meclizine 25 MG tablet Commonly known as: ANTIVERT Take 25 mg by mouth every 8 (eight) hours as needed for dizziness.    nystatin powder Commonly known as: MYCOSTATIN/NYSTOP Apply 1 application topically See admin instructions. Apply to rash around stoma on stoma care days, when replacing bag - on Mondays    ondansetron 4 MG tablet Commonly known as: ZOFRAN Take 1 tablet (4 mg total) by mouth every 6 (six) hours as needed for nausea or vomiting.    polyethylene glycol 17 g packet Commonly known as: MIRALAX / GLYCOLAX Take 17 g by mouth daily as needed (constipation). Mix with 8 ounces of fluid and drink    Restasis 0.05 % ophthalmic emulsion Generic drug: cycloSPORINE Place 1 drop into both eyes 2 (two) times daily.    vitamin C 250 MG tablet Commonly known as: ASCORBIC ACID Take 250 mg by mouth 2 (two) times daily.            Relevant Imaging Results:  Relevant Lab Results:   Additional Information    Shade Flood, LCSW

## 2021-11-02 NOTE — TOC Transition Note (Signed)
Transition of Care [facility]) - CM/SW Discharge Note   Patient Details  Name: [name] MRN: [id number] Date of Birth: [birth year]  Transition of Care [facility] CM/SW Contact:  Shade Flood, LCSW Phone Number: 11/02/2021, 1:03 PM   Clinical Narrative:     Pt stable for dc back to ALF today per MD. Updated ALF and faxed DC summary and FL2. ALF will transport pt at 130. Updated RN.  There are no other TOC needs for dc.  Final next level of care: Assisted Living Barriers to Discharge: Barriers Resolved   Patient Goals and CMS Choice Patient states their goals for this hospitalization and ongoing recovery are:: get better      Discharge Placement                       Discharge Plan and Services In-house Referral: Clinical Social Work                                   Social Determinants of Health (SDOH) Interventions     Readmission Risk Interventions Readmission Risk Prevention Plan 11/01/2021 04/24/2019 04/21/2019  Transportation Screening Complete - Complete  HRI or Home Care Consult Complete - Complete  Social Work Consult for [place] Planning/Counseling Complete - Complete  Palliative Care Screening Not Applicable Not Applicable -  Medication Review Press photographer) Complete - -  Some recent data might be hidden

## 2021-11-02 NOTE — Care Management Important Message (Signed)
Important Message  Patient Details  Name: [name] MRN: [id number] Date of Birth: [birth year]   Medicare Important Message Given:  Yes     Tommy Medal 11/02/2021, 1:09 PM

## 2021-11-02 NOTE — Discharge Summary (Signed)
Physician Discharge Summary  Patient ID: [name] MRN: [id number] DOB/AGE: 1934/02/25 88 y.o.  Admit date: 10/30/2021 Discharge date: 11/02/2021  Admission Diagnoses:  Discharge Diagnoses:  Principal Problem:   SBO (small bowel obstruction) ([place]) Active Problems:   Acute CHF (congestive heart failure) ([place])   Acute respiratory failure ([facility])   Hypothyroidism   Acute renal failure superimposed on stage 3a chronic kidney disease ([place])   Discharged Condition: stable  Hospital Course: This 88 years old female with PMH significant for asthma, CHF, colon cancer s/p partial colectomy with descending colostomy, complete heart block, COPD, diabetes mellitus type 2, hyperlipidemia, hypertension, hypothyroidism presented to the ED with c/o: right abdominal pain for 2 days.  She describes pain as sharp associated with the vomiting.  She was hypoxic on arrival with SPO2 70% on room air,  placed on 2 L of supplemental oxygen. Patient was somewhat somnolent and further history was obtained from ED chart.  Labs in the ED showed serum creatinine 2.02 consistent with AKI.  CT chest abdomen and pelvis showed multivessel CAD, cholelithiasis, descending colostomy, partial large bowel obstruction and partial small bowel obstruction.  Patient was kept NPO. admitted for partial small bowel obstruction.  General surgery was consulted,  Patient was found to have stool and gas in the colostomy bag, recommended clear liquid diet.  Diet advanced to Full liquid diet. Patient also been found to have acute CHF exacerbation requiring IV diuresis.  Last echocardiogram shows LVEF 40 to 45%.  Patient small bowel obstruction resolved.  By the time of discharge she was back to her baseline status with O2 sats normal on room air.  Patient be discharged back to her assisted living we have also arranged home health and physical therapy for her at home.  She was tolerating p.o. for liquid diet.  She is to follow-up with primary care  physician approximately 1 to 2 weeks.  General surgery was involved during his hospitalization.  Partial small and large bowel obstruction: Patient presented with abdominal pain,  distention and vomiting. CT abdomen and pelvis showed partial large and small bowel obstruction. Patient was kept NPO, no NG tube was recommended. General surgery consulted, found to have stool and gas through colostomy bag. Bowel obstruction resolved patient tolerated full liquid diet. Advanced to full liquids, advance as tolerated.   Acute combined systolic and diastolic CHF: Patient found to have bilateral leg edema, SOB on exertion, tachycardia , cardiomegaly and pulmonary edema on chest x-ray. IV Lasix 40 mg every 12 hr Last echocardiogram LVEF 40 to 45%. Monitor daily weight, intake output charting. Continue cardioprotective medications aspirin, Coreg, Imdur, statin.   Acute hypoxic respiratory failure.: Secondary to CHF exacerbation. SPO2 70% on room air, improved to 92% on 2 L. Wean off as tolerated.  Patient was covered with Ceftriaxone. Antibiotic discontinued as procalcitonin negative.   Hypothyroidism: Continue levothyroxine.   AKI on CKD stage 3A: Serum creatinine 1.2, presented with serum creatinine 2.02. Hold nephrotoxic medications.  Monitor serum creatinine as outpatient.    Discharge Exam: Blood pressure (!) 132/51, pulse 90, temperature 98.2 F (36.8 C), temperature source Oral, resp. rate 18, height 5\' 6"  (1.676 m), weight 86 kg, SpO2 93 %. General appearance: alert and cooperative Head: Normocephalic, without obvious abnormality, atraumatic Resp: clear to auscultation bilaterally Breasts: normal appearance, no masses or tenderness Cardio: regular rate and rhythm, S1, S2 normal, no murmur, click, rub or gallop GI: soft, non-tender; bowel sounds normal; no masses,  no organomegaly Extremities: extremities normal, atraumatic,  no cyanosis or edema Pulses: 2+ and symmetric Skin:  Skin color, texture, turgor normal. No rashes or lesions  Disposition: Discharge disposition: 01-Home or Self Care       Discharge Instructions     Diet - low sodium heart healthy   Complete by: As directed    Discharge instructions   Complete by: As directed    Follow up with PCP one to 2 weeks   Increase activity slowly   Complete by: As directed       Allergies as of 11/02/2021   No Known Allergies      Medication List     STOP taking these medications    BENGAY ARTHRITIS FORMULA EX   guaifenesin 100 MG/5ML syrup Commonly known as: ROBITUSSIN   loperamide 2 MG capsule Commonly known as: IMODIUM   magnesium hydroxide 400 MG/5ML suspension Commonly known as: MILK OF MAGNESIA   traMADol 50 MG tablet Commonly known as: ULTRAM   Triple Antibiotic [phone] Oint       TAKE these medications    acetaminophen 500 MG tablet Commonly known as: TYLENOL Take 500-1,000 mg by mouth See admin instructions. Take 2 tablets (1000 mg) by mouth every morning, may also take 1 tablet (500  mg) every 6 hours as needed for fever 99.5-101 F, minor headache, minor discomfort   alum & mag hydroxide-simeth 200-200-20 MG/5ML suspension Commonly known as: MAALOX/MYLANTA Take 30 mLs by mouth every 6 (six) hours as needed for heartburn or indigestion.   aspirin EC 81 MG tablet Take 1 tablet (81 mg total) by mouth every morning.   atorvastatin 20 MG tablet Commonly known as: LIPITOR Take 20 mg by mouth at bedtime.   carboxymethylcellulose 1 % ophthalmic solution Place 1 drop into both eyes 3 (three) times daily.   carvedilol 6.25 MG tablet Commonly known as: COREG Take 6.25 mg by mouth 2 (two) times daily with a meal.   cyclobenzaprine 5 MG tablet Commonly known as: FLEXERIL Take 5 mg by mouth at bedtime.   famotidine 20 MG tablet Commonly known as: PEPCID Take 1 tablet (20 mg total) by mouth daily.   fluticasone-salmeterol 500-50 MCG/ACT Aepb Commonly known as:  ADVAIR Inhale 1 puff into the lungs in the morning and at bedtime.   furosemide 40 MG tablet Commonly known as: LASIX Take 40 mg by mouth daily.   hydrALAZINE 50 MG tablet Commonly known as: APRESOLINE Take 1 tablet (50 mg total) by mouth 3 (three) times daily.   iron polysaccharides 150 MG capsule Commonly known as: NIFEREX Take 150 mg by mouth 2 (two) times daily.   isosorbide mononitrate 60 MG 24 hr tablet Commonly known as: IMDUR Take 1 tablet (60 mg total) by mouth daily.   levothyroxine 50 MCG tablet Commonly known as: SYNTHROID Take 50 mcg by mouth daily at 6 (six) AM.   loratadine 10 MG tablet Commonly known as: CLARITIN Take 10 mg by mouth daily.   meclizine 25 MG tablet Commonly known as: ANTIVERT Take 25 mg by mouth every 8 (eight) hours as needed for dizziness.   nystatin powder Commonly known as: MYCOSTATIN/NYSTOP Apply 1 application topically See admin instructions. Apply to rash around stoma on stoma care days, when replacing bag - on Mondays   ondansetron 4 MG tablet Commonly known as: ZOFRAN Take 1 tablet (4 mg total) by mouth every 6 (six) hours as needed for nausea or vomiting.   polyethylene glycol 17 g packet Commonly known as: MIRALAX / GLYCOLAX Take 17  g by mouth daily as needed (constipation). Mix with 8 ounces of fluid and drink   Restasis 0.05 % ophthalmic emulsion Generic drug: cycloSPORINE Place 1 drop into both eyes 2 (two) times daily.   vitamin C 250 MG tablet Commonly known as: ASCORBIC ACID Take 250 mg by mouth 2 (two) times daily.         Signed: Keondria Siever A 11/02/2021, 12:08 PM

## 2021-11-02 NOTE — Progress Notes (Signed)
[facility] Progress Note     Subjective: Patient seen and examined.  She is resting comfortably in bed.  She denies nausea and vomiting.  She continues to have stool and gas through her ostomy.  She states that she did not like the pured diet yesterday.  When asked what she eats at home, she stated that she eats nothing at home.  Objective: Vital signs in last 24 hours: Temp:  [97.4 F (36.3 C)-98.8 F (37.1 C)] 98.2 F (36.8 C) (01/25 0443) Pulse Rate:  [87-96] 90 (01/25 0443) Resp:  [18] 18 (01/24 2129) BP: (129-132)/(51-54) 132/51 (01/25 0443) SpO2:  [93 %-100 %] 93 % (01/25 1000) Weight:  [86 kg] 86 kg (01/25 0443) Last BM Date: 11/02/21  Intake/Output from previous day: 01/24 0701 - 01/25 0700 In: 220 [P.O.:220] Out: 1500 [Urine:1500] Intake/Output this shift: Total I/O In: 20 [P.O.:20] Out: 25 [Stool:25]  General appearance: cooperative, no distress, and somnolent GI: Soft, nondistended, no percussion tenderness, nontender to palpation, ostomy in left side of abdomen is pink and patent, with liquid and formed stool within the bag; parastomal hernia soft and nontender, no rigidity, guarding, rebound tenderness  Lab Results:  Recent Labs    11/01/21 0538 11/02/21 0544  WBC 5.0 4.4  HGB 10.1* 9.6*  HCT 33.3* 33.1*  PLT 173 174   BMET Recent Labs    11/01/21 0538 11/02/21 0544  NA 141 138  K 4.2 3.7  CL 102 99  CO2 26 31  GLUCOSE 71 83  BUN 36* 34*  CREATININE 1.43* 1.24*  CALCIUM 9.0 8.8*   PT/INR Recent Labs    10/30/21 1732  LABPROT 14.5  INR 1.1    Studies/Results: No results found.  Anti-infectives: Anti-infectives (From admission, onward)    Start     Dose/Rate Route Frequency Ordered Stop   10/30/21 1700  cefTRIAXone (ROCEPHIN) 2 g in sodium chloride 0.9 % 100 mL IVPB        2 g 200 mL/hr over 30 Minutes Intravenous  Once 10/30/21 1658 10/30/21 1819   10/30/21 1700  metroNIDAZOLE (FLAGYL) IVPB 500 mg        500  mg 100 mL/hr over 60 Minutes Intravenous  Once 10/30/21 1658 10/30/21 1950       Assessment/Plan: Patient is an 88 year old female who was admitted in acute CHF exacerbation noted to have partial small bowel obstruction and partial large bowel obstruction on CT abdomen pelvis  -Patient's bowel obstructions resolved at this time given continued bowel movements without nausea or vomiting -No leukocytosis, WBC 4.4 -AKI continuing to improve, creatinine 1.24 today -Okay for regular pured diet from general surgery standpoint -SLP recommending dysphagia 1 diet, appreciate recommendations -Continue Dulcolax suppositories and Colace -No acute surgical intervention at this time -Patient stable for discharge from general surgery standpoint, as bowel obstructions have resolved -Remainder of care per primary team   LOS: 3 days    Khan Chura A Ashima Shrake 11/02/2021

## 2021-11-02 NOTE — Progress Notes (Signed)
Removed IV from LFA , and Right IJ peripheral IV without incident. Patient is resting comfortably in bed at this time.

## 2021-11-04 ENCOUNTER — Encounter (HOSPITAL_COMMUNITY): Payer: Self-pay

## 2021-11-04 ENCOUNTER — Emergency Department (HOSPITAL_COMMUNITY)
Admission: EM | Admit: 2021-11-04 | Discharge: 2021-11-05 | Disposition: A | Discharge status: Skilled Nursing Facility | Payer: Medicare HMO | Attending: Student | Admitting: Student

## 2021-11-04 ENCOUNTER — Other Ambulatory Visit (HOSPITAL_COMMUNITY): Payer: Medicare HMO

## 2021-11-04 ENCOUNTER — Emergency Department (HOSPITAL_COMMUNITY): Payer: Medicare HMO

## 2021-11-04 DIAGNOSIS — I130 Hypertensive heart and chronic kidney disease with heart failure and stage 1 through stage 4 chronic kidney disease, or unspecified chronic kidney disease: Secondary | ICD-10-CM | POA: Diagnosis not present

## 2021-11-04 DIAGNOSIS — Z7982 Long term (current) use of aspirin: Secondary | ICD-10-CM | POA: Diagnosis not present

## 2021-11-04 DIAGNOSIS — R1084 Generalized abdominal pain: Secondary | ICD-10-CM | POA: Diagnosis present

## 2021-11-04 DIAGNOSIS — F0390 Unspecified dementia without behavioral disturbance: Secondary | ICD-10-CM | POA: Insufficient documentation

## 2021-11-04 DIAGNOSIS — I5043 Acute on chronic combined systolic (congestive) and diastolic (congestive) heart failure: Secondary | ICD-10-CM | POA: Insufficient documentation

## 2021-11-04 DIAGNOSIS — E039 Hypothyroidism, unspecified: Secondary | ICD-10-CM | POA: Insufficient documentation

## 2021-11-04 DIAGNOSIS — E1122 Type 2 diabetes mellitus with diabetic chronic kidney disease: Secondary | ICD-10-CM | POA: Diagnosis not present

## 2021-11-04 DIAGNOSIS — J449 Chronic obstructive pulmonary disease, unspecified: Secondary | ICD-10-CM | POA: Diagnosis not present

## 2021-11-04 DIAGNOSIS — Z85038 Personal history of other malignant neoplasm of large intestine: Secondary | ICD-10-CM | POA: Diagnosis not present

## 2021-11-04 DIAGNOSIS — Z85048 Personal history of other malignant neoplasm of rectum, rectosigmoid junction, and anus: Secondary | ICD-10-CM | POA: Insufficient documentation

## 2021-11-04 DIAGNOSIS — Z79899 Other long term (current) drug therapy: Secondary | ICD-10-CM | POA: Diagnosis not present

## 2021-11-04 DIAGNOSIS — N1831 Chronic kidney disease, stage 3a: Secondary | ICD-10-CM | POA: Diagnosis not present

## 2021-11-04 DIAGNOSIS — J45909 Unspecified asthma, uncomplicated: Secondary | ICD-10-CM | POA: Diagnosis not present

## 2021-11-04 LAB — COMPREHENSIVE METABOLIC PANEL
ALT: 34 U/L (ref 0–44)
AST: 44 U/L — ABNORMAL HIGH (ref 15–41)
Albumin: 3.9 g/dL (ref 3.5–5.0)
Alkaline Phosphatase: 80 U/L (ref 38–126)
Anion gap: 12 (ref 5–15)
BUN: 32 mg/dL — ABNORMAL HIGH (ref 8–23)
CO2: 29 mmol/L (ref 22–32)
Calcium: 9.3 mg/dL (ref 8.9–10.3)
Chloride: 100 mmol/L (ref 98–111)
Creatinine, Ser: 1.47 mg/dL — ABNORMAL HIGH (ref 0.44–1.00)
GFR, Estimated: 35 mL/min — ABNORMAL LOW (ref >60)
Glucose, Bld: 87 mg/dL (ref 70–99)
Potassium: 3.8 mmol/L (ref 3.5–5.1)
Sodium: 141 mmol/L (ref 135–145)
Total Bilirubin: 0.6 mg/dL (ref 0.3–1.2)
Total Protein: 7.9 g/dL (ref 6.5–8.1)

## 2021-11-04 LAB — CBC WITH DIFFERENTIAL/PLATELET
Abs Immature Granulocytes: 0.1 10*3/uL — ABNORMAL HIGH (ref 0.00–0.07)
Basophils Absolute: 0 10*3/uL (ref 0.0–0.1)
Basophils Relative: 0 %
Eosinophils Absolute: 0.3 10*3/uL (ref 0.0–0.5)
Eosinophils Relative: 4 %
HCT: 41.4 % (ref 36.0–46.0)
Hemoglobin: 12.6 g/dL (ref 12.0–15.0)
Immature Granulocytes: 1 %
Lymphocytes Relative: 9 %
Lymphs Abs: 0.7 10*3/uL (ref 0.7–4.0)
MCH: 30.4 pg (ref 26.0–34.0)
MCHC: 30.4 g/dL (ref 30.0–36.0)
MCV: 100 fL (ref 80.0–100.0)
Monocytes Absolute: 0.4 10*3/uL (ref 0.1–1.0)
Monocytes Relative: 5 %
Neutro Abs: 6 10*3/uL (ref 1.7–7.7)
Neutrophils Relative %: 81 %
Platelets: 235 10*3/uL (ref 150–400)
RBC: 4.14 MIL/uL (ref 3.87–5.11)
RDW: 15.1 % (ref 11.5–15.5)
WBC: 7.5 10*3/uL (ref 4.0–10.5)
nRBC: 0 % (ref 0.0–0.2)

## 2021-11-04 LAB — LIPASE, BLOOD: Lipase: 20 U/L (ref 11–51)

## 2021-11-04 MED ORDER — DROPERIDOL 2.5 MG/ML IJ SOLN: 1.2500 mg | Freq: Once | INTRAMUSCULAR | Status: DC

## 2021-11-04 MED ORDER — DIPHENHYDRAMINE HCL 50 MG/ML IJ SOLN
25.0000 mg | Freq: Once | INTRAMUSCULAR | Status: AC
  Administered 2021-11-04: 25 mg via INTRAVENOUS
  Filled 2021-11-04: qty 1

## 2021-11-04 MED ORDER — HYDROCODONE-ACETAMINOPHEN 5-325 MG PO TABS
1.0000 | ORAL_TABLET | Freq: Once | ORAL | Status: AC
  Administered 2021-11-04: 1 via ORAL
  Filled 2021-11-04: qty 1

## 2021-11-04 MED ORDER — HALOPERIDOL LACTATE 5 MG/ML IJ SOLN
1.0000 mg | Freq: Once | INTRAMUSCULAR | Status: AC
Start: 2021-11-04 — End: 2021-11-04
  Administered 2021-11-04: 1 mg via INTRAVENOUS
  Filled 2021-11-04: qty 1

## 2021-11-04 MED ORDER — IOHEXOL 300 MG/ML  SOLN
75.0000 mL | Freq: Once | INTRAMUSCULAR | Status: AC | PRN
  Administered 2021-11-04: 100 mL via INTRAVENOUS

## 2021-11-04 NOTE — ED Triage Notes (Signed)
Patient here via EMS with complaints of RLQ pain for the past several days. Recently admitted for SBO. Barry reports that patient has been acting lethargic since discharge. States that pain is around colostomy bag with little output.

## 2021-11-05 LAB — CULTURE, BLOOD (ROUTINE X 2)
Culture: NO GROWTH
Culture: NO GROWTH
Special Requests: ADEQUATE

## 2021-11-05 NOTE — ED Provider Notes (Signed)
[facility] EMERGENCY DEPARTMENT Provider Note  CSN: [id number] Arrival date & time: 11/04/21 1848  Chief Complaint(s) Abdominal Pain  HPI [name] is a 88 y.o. female with PMH asthma, CHF, colectomy with descending colostomy, COPD, T2DM, hypothyroidism who presents emergency department for evaluation of abdominal pain and lethargy.  Patient recently discharged on 11/02/2021 for similar presentation.  Staff at patient's facility concern for increased lethargy and patient complains of abdominal pain with single episode of vomiting.  Patient endorses generalized abdominal pain all over but denies chest pain, shortness of breath, headache, cough, fever or other systemic symptoms.  Patient is mildly somnolent, but easily awakens to any line of questioning.  She is alert and oriented answering questions appropriately.   Abdominal Pain  Past Medical History Past Medical History:  Diagnosis Date   Arthritis    "comes and goes" (03/01/2015)   Asthma    CHF (congestive heart failure) (HCC)    Colon cancer (HCC)    Colostomy care ([place])    Complete heart block ([place])    COPD (chronic obstructive pulmonary disease) ([place])    Diabetes mellitus without complication ([place])    pt denies this hx on 03/01/2015   HCAP (healthcare-associated pneumonia) 04/22/2017   History of blood transfusion 03/01/2015; 04/23/2017   "related to anemia,"   Hyperlipidemia    Hypertension    Hypothyroidism    Pulmonary embolism [facility])    Rectal cancer ypTypN0 (0/13) s/p chemoXRT/ APR/colostomy 2006 07/27/2015   [facility]  [place], [place] [address]  [phone]   REPORT OF SURGICAL PATHOLOGY   Case #: [id number]  Patient Name: CEARA, WRIGHTSON  PID: [id number]  Pathologist: Janae Bridgeman L. Golden Circle, MD  DOB/Age Jan 09, 1934 (Age: 39) Gender: F  Date Taken: 11/03/2004  Date Received: 11/03/2004   FINAL DIAGNOSIS   MICROSCOPIC EXAMINATION AND DIAGNOSIS   1. EXCISION, PERIRECTAL TISS   Patient Active  Problem List   Diagnosis Date Noted   Acute renal failure superimposed on stage 3a chronic kidney disease ([place]) 03/13/2021   Vertigo 03/13/2021   Chronic combined systolic and diastolic CHF (congestive heart failure) ([place]) 04/18/2019   Personal history of gout 08/28/2018   Recurrent parastomal hernia at [facility] colostomy 08/21/2018   SBO (small bowel obstruction) ([place]) 08/21/2018   Chest pain 10/28/2017   Aspiration of liquid 10/28/2017   Bilateral lower extremity edema    Pneumonia 04/22/2017   Altered mental status    Hypotension 02/16/2017   Syncope 02/02/2016   Faintness    Hypothyroidism 11/16/2015   Acute on chronic combined systolic and diastolic CHF  94/50/3888   Hypertensive heart disease 10/04/2015   Elevated troponin 10/04/2015   Acute CHF (congestive heart failure) ([place]) 10/01/2015   Acute respiratory failure (HCC)    NICM (nonischemic cardiomyopathy) ([place]) 08/11/2015   Normal coronary arteries 08/11/2015   Dyslipidemia 07/27/2015   CKD (chronic kidney disease) stage 3, GFR 30-59 ml/min 07/27/2015   Anemia 07/27/2015   FUO (fever of unknown origin) 07/27/2015   IDA (iron deficiency anemia) 07/27/2015   Rectal cancer ypTypN0 (0/13) s/p chemoXRT/ APR/colostomy 2006 07/27/2015   Sepsis ([place]) 07/24/2015   Acute encephalopathy 07/24/2015   Hypokalemia 07/24/2015   Dementia ([place]) 07/23/2015   COPD (chronic obstructive pulmonary disease) ([place]) 04/25/2015   ICD in place 04/14/2015   Colostomy in place [facility]) 03/01/2015   Pacemaker 11/07/2012   Anxiety 10/04/2011   Home Medication(s) Prior to Admission medications   Medication Sig Start Date  End Date Taking? Authorizing Provider  acetaminophen (TYLENOL) 500 MG tablet Take 500-1,000 mg by mouth See admin instructions. Take 2 tablets (1000 mg) by mouth every morning, may also take 1 tablet (500  mg) every 6 hours as needed for fever 99.5-101 F, minor headache, minor discomfort    [provider]  alum & mag  hydroxide-simeth (MAALOX/MYLANTA) 200-200-20 MG/5ML suspension Take 30 mLs by mouth every 6 (six) hours as needed for heartburn or indigestion.    [provider]  aspirin EC 81 MG tablet Take 1 tablet (81 mg total) by mouth every morning. 03/03/15   Rai, Ripudeep K, MD  atorvastatin (LIPITOR) 20 MG tablet Take 20 mg by mouth at bedtime.     [provider]  carboxymethylcellulose 1 % ophthalmic solution Place 1 drop into both eyes 3 (three) times daily.     [provider]  carvedilol (COREG) 6.25 MG tablet Take 6.25 mg by mouth 2 (two) times daily with a meal.     [provider]  cyclobenzaprine (FLEXERIL) 5 MG tablet Take 5 mg by mouth at bedtime.    [provider]  famotidine (PEPCID) 20 MG tablet Take 1 tablet (20 mg total) by mouth daily. 02/08/20   Annita Brod, MD  fluticasone-salmeterol (ADVAIR) 500-50 MCG/ACT AEPB Inhale 1 puff into the lungs in the morning and at bedtime.    [provider]  furosemide (LASIX) 40 MG tablet Take 40 mg by mouth daily.    [provider]  hydrALAZINE (APRESOLINE) 50 MG tablet Take 1 tablet (50 mg total) by mouth 3 (three) times daily. 03/15/21   Shelly Coss, MD  iron polysaccharides (NIFEREX) 150 MG capsule Take 150 mg by mouth 2 (two) times daily.    [provider]  isosorbide mononitrate (IMDUR) 60 MG 24 hr tablet Take 1 tablet (60 mg total) by mouth daily. 02/08/20   Annita Brod, MD  levothyroxine (SYNTHROID, LEVOTHROID) 50 MCG tablet Take 50 mcg by mouth daily at 6 (six) AM. 01/16/15   [provider]  loratadine (CLARITIN) 10 MG tablet Take 10 mg by mouth daily.    [provider]  meclizine (ANTIVERT) 25 MG tablet Take 25 mg by mouth every 8 (eight) hours as needed for dizziness.    [provider]  nystatin (MYCOSTATIN/NYSTOP) powder Apply 1 application topically See admin instructions. Apply to rash around stoma on stoma care days, when  replacing bag - on Mondays    [provider]  ondansetron (ZOFRAN) 4 MG tablet Take 1 tablet (4 mg total) by mouth every 6 (six) hours as needed for nausea or vomiting. 11/07/84   Delora Fuel, MD  polyethylene glycol [facility] / Floria Raveling) packet Take 17 g by mouth daily as needed (constipation). Mix with 8 ounces of fluid and drink    [provider]  RESTASIS 0.05 % ophthalmic emulsion Place 1 drop into both eyes 2 (two) times daily. 10/27/21   [provider]  vitamin C (ASCORBIC ACID) 250 MG tablet Take 250 mg by mouth 2 (two) times daily.    [provider]  Past Surgical History Past Surgical History:  Procedure Laterality Date   ABDOMINAL HYSTERECTOMY     ABDOMINOPERINEAL PROCTOCOLECTOMY  2006   Neoadj chemoXRT & open APR Dr Dalbert Batman for low rectal cancer   ANKLE FRACTURE SURGERY Left    CARDIAC CATHETERIZATION N/A 03/22/2015   Procedure: Right/Left Heart Cath and Coronary Angiography;  Surgeon: Leonie Man, MD;  Location: [facility];  Service: Cardiovascular;  Laterality: N/A;   CATARACT EXTRACTION W/ INTRAOCULAR LENS  IMPLANT, BILATERAL Bilateral    CHOLECYSTECTOMY     COLOSTOMY  2006   permananet end colostomy after APR resection of low rectal cancer.  Dr Dalbert Batman   DILATION AND CURETTAGE OF UTERUS     FRACTURE SURGERY     ICD IMPLANT  09/23/2012   MDT, implanted for primary prevention by Dr Deno Etienne in [place]  2007   SBO.  Dr Dalbert Batman   PARASTOMAL HERNIA REPAIR  2007   lysis of adhesions   TUBAL LIGATION     Family History Family History  Problem Relation Age of Onset   CAD Other    Hypertension Other    Stroke Other    Heart disease Mother        No details   Colon cancer Neg Hx    GI Bleed Neg Hx     Social History Social History   Tobacco Use   Smoking status: Never    Smokeless tobacco: Never  Vaping Use   Vaping Use: Never used  Substance Use Topics   Alcohol use: No   Drug use: No   Allergies Patient has no known allergies.  Review of Systems Review of Systems  Gastrointestinal:  Positive for abdominal pain.   Physical Exam Vital Signs  I have reviewed the triage vital signs BP (!) 144/68 Comment: Simultaneous filing. User may not have seen previous data.   Pulse 90    Temp 97.6 F (36.4 C) (Oral)    Resp 19    Ht 5\' 6"  (1.676 m)    Wt 86 kg    SpO2 91%    BMI 30.60 kg/m   Physical Exam Vitals and nursing note reviewed.  Constitutional:      General: She is not in acute distress.    Appearance: She is well-developed.  HENT:     Head: Normocephalic and atraumatic.  Eyes:     Conjunctiva/sclera: Conjunctivae normal.  Cardiovascular:     Rate and Rhythm: Normal rate and regular rhythm.     Heart sounds: No murmur heard. Pulmonary:     Effort: Pulmonary effort is normal. No respiratory distress.     Breath sounds: Normal breath sounds.  Abdominal:     Palpations: Abdomen is soft.     Tenderness: There is generalized abdominal tenderness.     Comments: Colostomy in place with colostomy site clean dry and intact and no external evidence of infection  Musculoskeletal:        General: No swelling.     Cervical back: Neck supple.  Skin:    General: Skin is warm and dry.     Capillary Refill: Capillary refill takes less than 2 seconds.  Neurological:     Mental Status: She is alert.  Psychiatric:        Mood and Affect: Mood normal.    ED Results and Treatments Labs (all labs ordered are listed, but only abnormal results are displayed) Labs Reviewed  CBC WITH DIFFERENTIAL/PLATELET - Abnormal;  Notable for the following components:      Result Value   Abs Immature Granulocytes 0.10 (*)    All other components within normal limits  COMPREHENSIVE METABOLIC PANEL - Abnormal; Notable for the following components:   BUN 32 (*)     Creatinine, Ser 1.47 (*)    AST 44 (*)    GFR, Estimated 35 (*)    All other components within normal limits  LIPASE, BLOOD  URINALYSIS, ROUTINE W REFLEX MICROSCOPIC                                                                                                                          Radiology CT ABDOMEN PELVIS W CONTRAST  Result Date: 11/04/2021 CLINICAL DATA:  Right lower quadrant pain for several days, history of recent small bowel dilatation EXAM: CT ABDOMEN AND PELVIS WITH CONTRAST TECHNIQUE: Multidetector CT imaging of the abdomen and pelvis was performed using the standard protocol following bolus administration of intravenous contrast. RADIATION DOSE REDUCTION: This exam was performed according to the departmental dose-optimization program which includes automated exposure control, adjustment of the mA and/or kV according to patient size and/or use of iterative reconstruction technique. CONTRAST:  191mL OMNIPAQUE IOHEXOL 300 MG/ML  SOLN COMPARISON:  10/30/2021 FINDINGS: Lower chest: Mild dependent atelectatic changes are noted. Heart is enlarged in size. Hepatobiliary: Fatty infiltration of the liver is noted. Gallbladder is well distended with multiple gallstones. No complicating factors are seen. Pancreas: Unremarkable. No pancreatic ductal dilatation or surrounding inflammatory changes. Spleen: Normal in size without focal abnormality. Adrenals/Urinary Tract: Adrenal glands are within normal limits. Kidneys demonstrate a normal enhancement pattern bilaterally. Small renal cysts are seen bilaterally. No obstructive changes are seen. No renal calculi are noted. The bladder is well distended. Stomach/Bowel: Changes of prior APR resection are again seen and stable. Left-sided colostomy is noted with evidence of peristomal hernia and containing a loop of jejunum although no small bowel obstructive changes are seen. More proximal colon demonstrates fecal material throughout without evidence of  obstructive change. Appendix is not well visualized. No inflammatory changes are seen. Small bowel is within normal limits aside from the peristomal hernia. The stomach is unremarkable. Vascular/Lymphatic: Aortic atherosclerosis. No enlarged abdominal or pelvic lymph nodes. Reproductive: Status post hysterectomy. No adnexal masses. Other: No abdominal wall hernia or abnormality. No abdominopelvic ascites. Musculoskeletal: No acute or significant osseous findings. Chronic stable appearing compression deformities of L5 and L1 are seen. IMPRESSION: Postoperative changes are again identified without dilatation of the large or small bowel. Peristomal hernia is again noted containing a loop of jejunum although no obstructive changes are seen. Cholelithiasis without complicating factors. Fatty liver. Electronically Signed   By: Inez Catalina M.D.   On: 11/04/2021 20:21    Pertinent labs & imaging results that were available during my care of the patient were reviewed by me and considered in my medical decision making (see MDM for details).  Medications Ordered in ED Medications  iohexol (OMNIPAQUE) 300  MG/ML solution 75 mL (100 mLs Intravenous Contrast Given 11/04/21 1951)  diphenhydrAMINE (BENADRYL) injection 25 mg (25 mg Intravenous Given 11/04/21 2144)  haloperidol lactate (HALDOL) injection 1 mg (1 mg Intravenous Given 11/04/21 2145)  HYDROcodone-acetaminophen (NORCO/VICODIN) 5-325 MG per tablet 1 tablet (1 tablet Oral Given 11/04/21 2302)                                                                                                                                     Procedures Procedures  (including critical care time)  Medical Decision Making / ED Course   This patient presents to the ED for concern of abdominal pain, this involves an extensive number of treatment options, and is a complaint that carries with it a high risk of complications and morbidity.  The differential diagnosis includes SBO,  intra-abdominal infection, surgical site infection, external colostomy site infection  MDM: Patient seen in the emergency department for evaluation of abdominal pain.  Physical exam with generalized tenderness to palpation but is otherwise unremarkable.  Laboratory evaluation largely unremarkable outside of mild BUN elevation at 32, creatinine 1.47.  CT abdomen pelvis showing no evidence of acute infection in the abdomen or pelvis, no evidence of obstruction.  Patient given Haldol and Benadryl for abdominal pain leading to resolution of the patient's abdominal pain.  Patient again awakens to any line of questioning and is alert and oriented.  She is safe for discharge back to her facility at this time.  Patient then discharged back to her facility.   Additional history obtained:  -External records from outside source obtained and reviewed including: Chart review including previous notes, labs, imaging, consultation notes   Lab Tests: -I ordered, reviewed, and interpreted labs.   The pertinent results include:   Labs Reviewed  CBC WITH DIFFERENTIAL/PLATELET - Abnormal; Notable for the following components:      Result Value   Abs Immature Granulocytes 0.10 (*)    All other components within normal limits  COMPREHENSIVE METABOLIC PANEL - Abnormal; Notable for the following components:   BUN 32 (*)    Creatinine, Ser 1.47 (*)    AST 44 (*)    GFR, Estimated 35 (*)    All other components within normal limits  LIPASE, BLOOD  URINALYSIS, ROUTINE W REFLEX MICROSCOPIC      EKG   EKG Interpretation  Date/Time:  Friday November 04 2021 21:08:19 EST Ventricular Rate:  94 PR Interval:  195 QRS Duration: 183 QT Interval:  416 QTC Calculation: 521 R Axis:     Text Interpretation: paced ventricular rhythm No significant change since No significant change since last tracing Confirmed by Troy ([id number]) on 11/05/2021 8:15:51 AM         Imaging Studies ordered: I ordered imaging  studies including CTAP I independently visualized and interpreted imaging. I agree with the radiologist interpretation   Medicines ordered and prescription drug management: Meds ordered this encounter  Medications   iohexol (OMNIPAQUE) 300 MG/ML solution 75 mL   DISCONTD: droperidol (INAPSINE) 2.5 MG/ML injection 1.25 mg   diphenhydrAMINE (BENADRYL) injection 25 mg   haloperidol lactate (HALDOL) injection 1 mg   HYDROcodone-acetaminophen (NORCO/VICODIN) 5-325 MG per tablet 1 tablet    -I have reviewed the patients home medicines and have made adjustments as needed  Critical interventions none   Cardiac Monitoring: The patient was maintained on a cardiac monitor.  I personally viewed and interpreted the cardiac monitored which showed an underlying rhythm of: paced ventricular rhythm  Social Determinants of Health:  Factors impacting patients care include: none   Reevaluation: After the interventions noted above, I reevaluated the patient and found that they have :improved  Co morbidities that complicate the patient evaluation  Past Medical History:  Diagnosis Date   Arthritis    "comes and goes" (03/01/2015)   Asthma    CHF (congestive heart failure) ([place])    Colon cancer ([place])    Colostomy care ([place])    Complete heart block ([place])    COPD (chronic obstructive pulmonary disease) ([place])    Diabetes mellitus without complication ([place])    pt denies this hx on 03/01/2015   HCAP (healthcare-associated pneumonia) 04/22/2017   History of blood transfusion 03/01/2015; 04/23/2017   "related to anemia,"   Hyperlipidemia    Hypertension    Hypothyroidism    Pulmonary embolism [facility])    Rectal cancer ypTypN0 (0/13) s/p chemoXRT/ APR/colostomy 2006 07/27/2015   [facility]  [place], [place] [address]  [phone]   REPORT OF SURGICAL PATHOLOGY   Case #: [id number]  Patient Name: EVANGELA, HEFFLER  PID: [id number]  Pathologist: Janae Bridgeman L. Golden Circle, MD   DOB[birth year]Age [birth year] (Age: 47) Gender: F  Date Taken: 11/03/2004  Date Received: 11/03/2004   FINAL DIAGNOSIS   MICROSCOPIC EXAMINATION AND DIAGNOSIS   1. EXCISION, PERIRECTAL TISS      Dispostion: I considered admission for this patient, but she does not meet inpatient criteria at this time and is hemodynamically and medically safe for return to her facility with outpatient follow-up.     Final Clinical Impression(s) / ED Diagnoses Final diagnoses:  Generalized abdominal pain     @PCDICTATION @    Teressa Lower, MD 11/05/21 [phone]

## 2021-11-05 NOTE — ED Notes (Signed)
Spoke with [facility] this morning about transporting pt back to facility.  Per facility they do not have transport on the weekends.

## 2021-11-05 NOTE — ED Notes (Signed)
Breakfast tray has been given to the Pt.

## 2021-11-08 ENCOUNTER — Encounter: Payer: Medicare HMO | Admitting: Student

## 2021-11-09 NOTE — Progress Notes (Addendum)
Electrophysiology Office Note Date: 11/10/2021  ID:  [name], [name] 1933-11-16, MRN [id number]  PCP: Shelby Calender, PA-C Primary Cardiologist: Shelby Breeding, MD Electrophysiologist: Shelby Grayer, MD   CC: Routine ICD follow-up  [name] is a 88 y.o. female seen today for Shelby Grayer, MD for routine electrophysiology followup and for gen change planning, having met ERI as of 10/13/2021.    Recently admitted 1/22 - 11/02/2021 with SBO. Gas and stool noted in colostomy bag. SBO resolved with conservative therapy and liquid diet.   Since discharge from hospital the patient reports doing well overall.  she denies chest pain, palpitations, dyspnea, PND, orthopnea, nausea, vomiting, dizziness, syncope, edema, weight gain, or early satiety. She has not had ICD shocks. Son is present today. Mother has previously expressed she does not want any heroic measures including shocks.   Device History: Medtronic Dual Chamber ICD implanted 09/2012 for NICM.  Past Medical History:  Diagnosis Date   Arthritis    "comes and goes" (03/01/2015)   Asthma    CHF (congestive heart failure) (HCC)    Colon cancer (HCC)    Colostomy care ([place])    Complete heart block ([place])    COPD (chronic obstructive pulmonary disease) ([place])    Diabetes mellitus without complication ([place])    pt denies this hx on 03/01/2015   HCAP (healthcare-associated pneumonia) 04/22/2017   History of blood transfusion 03/01/2015; 04/23/2017   "related to anemia,"   Hyperlipidemia    Hypertension    Hypothyroidism    Pulmonary embolism [facility])    Rectal cancer ypTypN0 (0/13) s/p chemoXRT/ APR/colostomy 2006 07/27/2015   [facility]  [place], [place] [address]  [phone]   REPORT OF SURGICAL PATHOLOGY   Case #: [id number]  Patient Name: [name]  PID: [id number]  Pathologist: Shelby Bridgeman L. Golden Circle, MD  DOB/Age [birth year] (Age: 60) Gender: F  Date Taken: 11/03/2004  Date Received: 11/03/2004    FINAL DIAGNOSIS   MICROSCOPIC EXAMINATION AND DIAGNOSIS   1. EXCISION, PERIRECTAL TISS   Past Surgical History:  Procedure Laterality Date   ABDOMINAL HYSTERECTOMY     ABDOMINOPERINEAL PROCTOCOLECTOMY  2006   Neoadj chemoXRT & open APR Dr [name] for low rectal cancer   ANKLE FRACTURE SURGERY Left    CARDIAC CATHETERIZATION N/A 03/22/2015   Procedure: Right/Left Heart Cath and Coronary Angiography;  Surgeon: Shelby Man, MD;  Location: [place] [facility];  Service: Cardiovascular;  Laterality: N/A;   CATARACT EXTRACTION W/ INTRAOCULAR LENS  IMPLANT, BILATERAL Bilateral    CHOLECYSTECTOMY     COLOSTOMY  2006   permananet end colostomy after APR resection of low rectal cancer.  Dr [name]   DILATION AND CURETTAGE OF UTERUS     FRACTURE SURGERY     ICD IMPLANT  09/23/2012   MDT, implanted for primary prevention by Dr [name] in [place]  2007   SBO.  Dr [name] HERNIA REPAIR  2007   lysis of adhesions   TUBAL LIGATION      Current Outpatient Medications  Medication Sig Dispense Refill   acetaminophen (TYLENOL) 500 MG tablet Take 500-1,000 mg by mouth See admin instructions. Take 2 tablets (1000 mg) by mouth every morning, may also take 1 tablet (500  mg) every 6 hours as needed for fever 99.5-101 F, minor headache, minor discomfort     alum & mag hydroxide-simeth (MAALOX/MYLANTA) 200-200-20 MG/5ML suspension  Take 30 mLs by mouth every 6 (six) hours as needed for heartburn or indigestion.     aspirin EC 81 MG tablet Take 1 tablet (81 mg total) by mouth every morning.     atorvastatin (LIPITOR) 20 MG tablet Take 20 mg by mouth at bedtime.      carboxymethylcellulose 1 % ophthalmic solution Place 1 drop into both eyes 3 (three) times daily.      carvedilol (COREG) 6.25 MG tablet Take 6.25 mg by mouth 2 (two) times daily with a meal.      cyclobenzaprine (FLEXERIL) 5 MG tablet Take 5 mg by mouth at bedtime.     famotidine (PEPCID) 20 MG tablet Take 1  tablet (20 mg total) by mouth daily. 30 tablet 1   fluticasone-salmeterol (ADVAIR) 500-50 MCG/ACT AEPB Inhale 1 puff into the lungs in the morning and at bedtime.     furosemide (LASIX) 40 MG tablet Take 40 mg by mouth daily.     hydrALAZINE (APRESOLINE) 50 MG tablet Take 1 tablet (50 mg total) by mouth 3 (three) times daily.     iron polysaccharides (NIFEREX) 150 MG capsule Take 150 mg by mouth 2 (two) times daily.     isosorbide mononitrate (IMDUR) 60 MG 24 hr tablet Take 1 tablet (60 mg total) by mouth daily. 30 tablet 1   levothyroxine (SYNTHROID, LEVOTHROID) 50 MCG tablet Take 50 mcg by mouth daily at 6 (six) AM.  2   loratadine (CLARITIN) 10 MG tablet Take 10 mg by mouth daily.     meclizine (ANTIVERT) 25 MG tablet Take 25 mg by mouth every 8 (eight) hours as needed for dizziness.     nystatin (MYCOSTATIN/NYSTOP) powder Apply 1 application topically See admin instructions. Apply to rash around stoma on stoma care days, when replacing bag - on Mondays     ondansetron (ZOFRAN) 4 MG tablet Take 1 tablet (4 mg total) by mouth every 6 (six) hours as needed for nausea or vomiting. 12 tablet 0   polyethylene glycol (MIRALAX / GLYCOLAX) packet Take 17 g by mouth daily as needed (constipation). Mix with 8 ounces of fluid and drink     RESTASIS 0.05 % ophthalmic emulsion Place 1 drop into both eyes 2 (two) times daily.     vitamin C (ASCORBIC ACID) 250 MG tablet Take 250 mg by mouth 2 (two) times daily.     No current facility-administered medications for this visit.    Allergies:   Patient has no known allergies.   Social History: Social History   Socioeconomic History   Marital status: Divorced    Spouse name: Not on file   Number of children: 3   Years of education: Not on file   Highest education level: Not on file  Occupational History   Occupation: Retired Psychologist, counselling  Tobacco Use   Smoking status: Never   Smokeless tobacco: Never  Vaping Use   Vaping Use: Never used   Substance and Sexual Activity   Alcohol use: No   Drug use: No   Sexual activity: Not Currently  Other Topics Concern   Not on file  Social History Narrative   12/19 was a resident of assisted living.  Three sons.  Divorced.   Social Determinants of Health   Financial Resource Strain: Not on file  Food Insecurity: Not on file  Transportation Needs: Not on file  Physical Activity: Not on file  Stress: Not on file  Social Connections: Not on file  Intimate Partner  Violence: Not on file    Family History: Family History  Problem Relation Age of Onset   CAD Other    Hypertension Other    Stroke Other    Heart disease Mother        No details   Colon cancer Neg Hx    GI Bleed Neg Hx     Review of Systems: Review of systems complete and found to be negative unless listed in HPI.     Physical Exam: Vitals:   11/10/21 0954  BP: 138/80  Pulse: 96  SpO2: 99%  Weight: 189 lb 9.6 oz (86 kg)  Height: '5\' 6"'  (1.676 m)     General:  Elderly appearing. No resp difficulty. HEENT: Normal Neck: Supple. JVP 5-6. Carotids 2+ bilat; no bruits. No thyromegaly or nodule noted. Cor: PMI nondisplaced. RRR, No M/G/R noted Lungs: CTAB, normal effort. Abdomen: Soft, non-tender, non-distended, no HSM. No bruits or masses. +BS   Extremities: No cyanosis, clubbing, or rash. R and LLE no edema.  Neuro: Alert & orientedx3, cranial nerves grossly intact. moves all 4 extremities w/o difficulty. Affect pleasant   ICD interrogation- reviewed in detail today,  See PACEART report  EKG:  EKG is not ordered today. Personal review shows V pacing at 96 bpm   Recent Labs: 10/31/2021: B Natriuretic Peptide 1,187.0; TSH 0.734 11/02/2021: Magnesium 2.1 11/04/2021: ALT 34; BUN 32; Creatinine, Ser 1.47; Hemoglobin 12.6; Platelets 235; Potassium 3.8; Sodium 141   Wt Readings from Last 3 Encounters:  11/10/21 189 lb 9.6 oz (86 kg)  11/04/21 189 lb 9.5 oz (86 kg)  11/02/21 189 lb 9.5 oz (86 kg)      Other studies Reviewed: Additional studies/ records that were reviewed today include: Previous EP office notes.   Assessment and Plan:  1.  Chronic systolic dysfunction s/p Medtronic dual chamber ICD  Echo 10/2021 LVEF 45-50%  euvolemic today Stable on an appropriate medical regimen Normal ICD function See Pace Art report No changes today  She desires no heroic measures. She and son agree that she would like to downgrade to pacer without shock capability. Will discuss with MDT how best accomplished given her current RV lead is a 6935M  2. CHB Stable device function.  She is "functionally" dependent, but does have an underlying HR in the 30s today.  3.  HTN Stable on current regimen   Disposition:   Follow up with Dr. Rayann Heman  post device downgrade.    Jacalyn Lefevre, PA-C  11/10/2021 10:50 AM  [facility] [address] [place] [address] [phone] (office) [phone] (fax)

## 2021-11-10 ENCOUNTER — Ambulatory Visit (INDEPENDENT_AMBULATORY_CARE_PROVIDER_SITE_OTHER): Payer: Medicare HMO | Admitting: Student

## 2021-11-10 ENCOUNTER — Encounter: Payer: Medicare HMO | Admitting: Student

## 2021-11-10 ENCOUNTER — Other Ambulatory Visit: Payer: Self-pay

## 2021-11-10 ENCOUNTER — Encounter: Payer: Self-pay | Admitting: Student

## 2021-11-10 VITALS — BP 138/80 | HR 96 | Ht 66.0 in | Wt 189.6 lb

## 2021-11-10 DIAGNOSIS — Z95810 Presence of automatic (implantable) cardiac defibrillator: Secondary | ICD-10-CM

## 2021-11-10 DIAGNOSIS — I428 Other cardiomyopathies: Secondary | ICD-10-CM

## 2021-11-10 DIAGNOSIS — I442 Atrioventricular block, complete: Secondary | ICD-10-CM | POA: Diagnosis not present

## 2021-11-10 DIAGNOSIS — I10 Essential (primary) hypertension: Secondary | ICD-10-CM

## 2021-11-10 DIAGNOSIS — I5042 Chronic combined systolic (congestive) and diastolic (congestive) heart failure: Secondary | ICD-10-CM

## 2021-11-10 DIAGNOSIS — I5032 Chronic diastolic (congestive) heart failure: Secondary | ICD-10-CM

## 2021-11-10 LAB — CBC
Hematocrit: 31.6 % — ABNORMAL LOW (ref 34.0–46.6)
Hemoglobin: 10 g/dL — ABNORMAL LOW (ref 11.1–15.9)
MCH: 29.6 pg (ref 26.6–33.0)
MCHC: 31.6 g/dL (ref 31.5–35.7)
MCV: 94 fL (ref 79–97)
Platelets: 331 10*3/uL (ref 150–450)
RBC: 3.38 x10E6/uL — ABNORMAL LOW (ref 3.77–5.28)
RDW: 14.1 % (ref 11.7–15.4)
WBC: 10.3 10*3/uL (ref 3.4–10.8)

## 2021-11-10 LAB — CUP PACEART INCLINIC DEVICE CHECK
Battery Remaining Longevity: 1 mo
Battery Voltage: 2.7 V
Brady Statistic AP VP Percent: 0.07 %
Brady Statistic AP VS Percent: 0 %
Brady Statistic AS VP Percent: 99.89 %
Brady Statistic AS VS Percent: 0.03 %
Brady Statistic RA Percent Paced: 0.07 %
Brady Statistic RV Percent Paced: 99.97 %
Date Time Interrogation Session: 20230202102840
HighPow Impedance: 55 Ohm
Implantable Lead Implant Date: 20131216
Implantable Lead Implant Date: 20131216
Implantable Lead Location: 753859
Implantable Lead Location: 753860
Implantable Lead Model: 5076
Implantable Pulse Generator Implant Date: 20131216
Lead Channel Impedance Value: 304 Ohm
Lead Channel Impedance Value: 342 Ohm
Lead Channel Impedance Value: 380 Ohm
Lead Channel Pacing Threshold Amplitude: 0.625 V
Lead Channel Pacing Threshold Amplitude: 0.75 V
Lead Channel Pacing Threshold Pulse Width: 0.4 ms
Lead Channel Pacing Threshold Pulse Width: 0.4 ms
Lead Channel Sensing Intrinsic Amplitude: 1 mV
Lead Channel Sensing Intrinsic Amplitude: 1.25 mV
Lead Channel Sensing Intrinsic Amplitude: 4.375 mV
Lead Channel Sensing Intrinsic Amplitude: 4.75 mV
Lead Channel Setting Pacing Amplitude: 1.5 V
Lead Channel Setting Pacing Amplitude: 2 V
Lead Channel Setting Pacing Pulse Width: 0.4 ms
Lead Channel Setting Sensing Sensitivity: 0.3 mV

## 2021-11-10 LAB — BASIC METABOLIC PANEL
BUN/Creatinine Ratio: 18 (ref 12–28)
BUN: 26 mg/dL (ref 8–27)
CO2: 28 mmol/L (ref 20–29)
Calcium: 9.4 mg/dL (ref 8.7–10.3)
Chloride: 99 mmol/L (ref 96–106)
Creatinine, Ser: 1.43 mg/dL — ABNORMAL HIGH (ref 0.57–1.00)
Glucose: 93 mg/dL (ref 70–99)
Potassium: 4.5 mmol/L (ref 3.5–5.2)
Sodium: 140 mmol/L (ref 134–144)
eGFR: 36 mL/min/{1.73_m2} — ABNORMAL LOW (ref >59)

## 2021-11-10 NOTE — Patient Instructions (Addendum)
Medication Instructions: Your physician recommends that you continue on your current medications as directed. Please refer to the Current Medication list given to you today.  Labwork: Your physician has recommended that you have lab work today: BMET and CBC   Procedures/Testing: Your physician has recommended that you have a Generator Change of your device on 12/02/2021. This is a procedure that replaces a Pacemaker ICD generator that is at the end of its service life. The remaining lifespan of a pacemaker is determined during visits to the [facility]. The battery in a pacemaker does not stop suddenly but rather loses its charge slowly, which lets the cardiologist plan the replacement date.  Follow-Up: Your physician recommends that you schedule a follow-up appointment in 10 - 14 days from 12/02/2021 with the Device clinic for a wound check  Your physician recommends that you schedule a follow-up appointment in 3 months from 12/02/2021 with Dr. Rayann Heman.    If you need a refill on your cardiac medications before your next appointment, please call your pharmacy.   -------------------------------------------------------------------------------------------------------------  Please wash with the CHG Soap the night before and morning of procedure (follow instruction page "Preparing For Surgery").   Please report to the [facility] (Main Entrance A) [facility] at 11:30am ([place], [place] [address])  DO NOT eat or drink anything after midnight the night before procedure  You may take all of your morning medications the day of your procedure EXCEPT Aspirin & Furosemide with enough water to get them down safely.  You will need someone to drive you home after the procedure  -------------------------------------------------------------------------------------------------------------- [facility] - Preparing For Surgery  Before surgery, you can play an  important role. Because skin is not sterile, your skin needs to be as free of germs as possible. You can reduce the number of germs on your skin by washing with CHG (chlorahexidine gluconate) Soap before surgery.  CHG is an antiseptic cleaner which kills germs and bonds with the skin to continue killing germs even after washing.   Please do not use if you have an allergy to CHG or antibacterial soaps.  If your skin becomes reddened/irritated stop using the CHG.   Do not shave (including legs and underarms) for at least 48 hours prior to first CHG shower.  It is OK to shave your face.  Please follow these instructions carefully:  1.  Shower the night before surgery and the morning of surgery with CHG.  2.  If you choose to wash your hair, wash your hair first as usual with your normal shampoo.  3.  After you shampoo, rinse your hair and body thoroughly to remove the shampoo.  4.  Use CHG as you would any other liquid soap.  You can apply CHG directly to the skin and wash gently with a clean washcloth. 5.  Apply the CHG Soap to your body ONLY FROM THE NECK DOWN.  Do not use on open wounds or open sores.  Avoid contact with your eyes, ears, mouth and genitals (private parts).  Wash genitals (private parts) with your normal soap.  6.  Wash thoroughly, paying special attention to the area where your surgery will be performed.  7.  Thoroughly rinse your body with warm water from the neck down.   8.  DO NOT shower/wash with your normal soap after using and rinsing off the CHG soap.  9.  Pat yourself dry with a clean towel.  10.  Wear clean pajamas.           11.  Place clean sheets on your bed the night of your first shower and do not sleep with pets.  Day of Surgery: Do not apply any deodorants/lotions.  Please wear clean clothes to the hospital/surgery center.

## 2021-11-21 ENCOUNTER — Ambulatory Visit (INDEPENDENT_AMBULATORY_CARE_PROVIDER_SITE_OTHER): Payer: Medicare HMO

## 2021-11-21 DIAGNOSIS — I428 Other cardiomyopathies: Secondary | ICD-10-CM

## 2021-11-21 LAB — CUP PACEART REMOTE DEVICE CHECK
Battery Remaining Longevity: 1 mo
Battery Voltage: 2.69 V
Brady Statistic AP VP Percent: 0.02 %
Brady Statistic AP VS Percent: 0 %
Brady Statistic AS VP Percent: 99.97 %
Brady Statistic AS VS Percent: 0.01 %
Brady Statistic RA Percent Paced: 0.02 %
Brady Statistic RV Percent Paced: 99.99 %
Date Time Interrogation Session: 20230213012402
HighPow Impedance: 50 Ohm
Implantable Lead Implant Date: 20131216
Implantable Lead Implant Date: 20131216
Implantable Lead Location: 753859
Implantable Lead Location: 753860
Implantable Lead Model: 5076
Implantable Pulse Generator Implant Date: 20131216
Lead Channel Impedance Value: 266 Ohm
Lead Channel Impedance Value: 342 Ohm
Lead Channel Impedance Value: 342 Ohm
Lead Channel Pacing Threshold Amplitude: 0.625 V
Lead Channel Pacing Threshold Amplitude: 0.75 V
Lead Channel Pacing Threshold Pulse Width: 0.4 ms
Lead Channel Pacing Threshold Pulse Width: 0.4 ms
Lead Channel Sensing Intrinsic Amplitude: 1 mV
Lead Channel Sensing Intrinsic Amplitude: 1 mV
Lead Channel Sensing Intrinsic Amplitude: 4.375 mV
Lead Channel Sensing Intrinsic Amplitude: 4.75 mV
Lead Channel Setting Pacing Amplitude: 1.5 V
Lead Channel Setting Pacing Amplitude: 2 V
Lead Channel Setting Pacing Pulse Width: 0.4 ms
Lead Channel Setting Sensing Sensitivity: 0.3 mV

## 2021-11-23 NOTE — Progress Notes (Signed)
Remote ICD transmission.   

## 2021-11-29 ENCOUNTER — Telehealth: Payer: Self-pay

## 2021-11-29 NOTE — Telephone Encounter (Signed)
Need to notify [facility] at [phone] that Pt's arrival time for her procedure is now 9:30 am.  Need to also notify son.

## 2021-11-29 NOTE — Telephone Encounter (Signed)
Spoke with rep from [facility].  Advised of arrival time change.  Transportation not available to consult, she will discuss with staff tomorrow and call this nurse back.

## 2021-11-30 NOTE — Telephone Encounter (Signed)
Returned call to [facility].  Confirmed Pt will be transported to [facility] at 9:30 am on 12/02/21.  Left detailed message for Pt's son advising of time change.

## 2021-12-01 NOTE — Pre-Procedure Instructions (Signed)
Attempted to call patient regarding procedure instructions.  No answer 

## 2021-12-02 ENCOUNTER — Encounter (HOSPITAL_COMMUNITY): Payer: Self-pay | Admitting: Internal Medicine

## 2021-12-02 ENCOUNTER — Ambulatory Visit (HOSPITAL_COMMUNITY)
Admission: RE | Admit: 2021-12-02 | Discharge: 2021-12-02 | Disposition: A | Discharge status: Skilled Nursing Facility | Payer: Medicare HMO | Attending: Internal Medicine | Admitting: Internal Medicine

## 2021-12-02 ENCOUNTER — Encounter (HOSPITAL_COMMUNITY)
Admission: RE | Disposition: A | Discharge status: Skilled Nursing Facility | Payer: Medicare HMO | Source: Home / Self Care | Attending: Internal Medicine

## 2021-12-02 ENCOUNTER — Other Ambulatory Visit: Payer: Self-pay

## 2021-12-02 DIAGNOSIS — Z85048 Personal history of other malignant neoplasm of rectum, rectosigmoid junction, and anus: Secondary | ICD-10-CM | POA: Insufficient documentation

## 2021-12-02 DIAGNOSIS — F0390 Unspecified dementia without behavioral disturbance: Secondary | ICD-10-CM | POA: Insufficient documentation

## 2021-12-02 DIAGNOSIS — J449 Chronic obstructive pulmonary disease, unspecified: Secondary | ICD-10-CM | POA: Diagnosis not present

## 2021-12-02 DIAGNOSIS — Z4502 Encounter for adjustment and management of automatic implantable cardiac defibrillator: Secondary | ICD-10-CM | POA: Insufficient documentation

## 2021-12-02 DIAGNOSIS — I428 Other cardiomyopathies: Secondary | ICD-10-CM | POA: Diagnosis not present

## 2021-12-02 DIAGNOSIS — I442 Atrioventricular block, complete: Secondary | ICD-10-CM | POA: Insufficient documentation

## 2021-12-02 DIAGNOSIS — I5042 Chronic combined systolic (congestive) and diastolic (congestive) heart failure: Secondary | ICD-10-CM | POA: Diagnosis not present

## 2021-12-02 DIAGNOSIS — I110 Hypertensive heart disease with heart failure: Secondary | ICD-10-CM | POA: Insufficient documentation

## 2021-12-02 DIAGNOSIS — E119 Type 2 diabetes mellitus without complications: Secondary | ICD-10-CM | POA: Diagnosis not present

## 2021-12-02 HISTORY — PX: PACEMAKER IMPLANT: EP1218

## 2021-12-02 HISTORY — PX: ICD GENERATOR REMOVAL: EP1232

## 2021-12-02 SURGERY — ICD GENERATOR REMOVAL

## 2021-12-02 MED ORDER — MIDAZOLAM HCL 5 MG/5ML IJ SOLN
INTRAMUSCULAR | Status: AC
  Filled 2021-12-02: qty 5

## 2021-12-02 MED ORDER — POVIDONE-IODINE 10 % EX SWAB: 2.0000 "application " | Freq: Once | CUTANEOUS | Status: DC

## 2021-12-02 MED ORDER — SODIUM CHLORIDE 0.9 % IV SOLN
80.0000 mg | INTRAVENOUS | Status: AC
  Administered 2021-12-02: 80 mg

## 2021-12-02 MED ORDER — CEFAZOLIN SODIUM-DEXTROSE 2-4 GM/100ML-% IV SOLN
2.0000 g | INTRAVENOUS | Status: AC
  Administered 2021-12-02: 2 g via INTRAVENOUS

## 2021-12-02 MED ORDER — ONDANSETRON HCL 4 MG/2ML IJ SOLN: 4.0000 mg | Freq: Four times a day (QID) | INTRAMUSCULAR | Status: DC | PRN

## 2021-12-02 MED ORDER — SODIUM CHLORIDE 0.9 % IV SOLN: INTRAVENOUS | Status: DC

## 2021-12-02 MED ORDER — LIDOCAINE HCL (PF) 1 % IJ SOLN
INTRAMUSCULAR | Status: DC | PRN
Start: 2021-12-02 — End: 2021-12-02
  Administered 2021-12-02: 60 mL

## 2021-12-02 MED ORDER — SODIUM CHLORIDE 0.9 % IV SOLN: 250.0000 mL | INTRAVENOUS | Status: DC | PRN

## 2021-12-02 MED ORDER — SODIUM CHLORIDE 0.9% FLUSH: 3.0000 mL | Freq: Two times a day (BID) | INTRAVENOUS | Status: DC

## 2021-12-02 MED ORDER — SODIUM CHLORIDE 0.9% FLUSH: 3.0000 mL | INTRAVENOUS | Status: DC | PRN

## 2021-12-02 MED ORDER — MIDAZOLAM HCL 5 MG/5ML IJ SOLN
INTRAMUSCULAR | Status: DC | PRN
  Administered 2021-12-02: 1 mg via INTRAVENOUS

## 2021-12-02 MED ORDER — SODIUM CHLORIDE 0.9 % IV SOLN
INTRAVENOUS | Status: AC
  Filled 2021-12-02: qty 2

## 2021-12-02 MED ORDER — LIDOCAINE HCL 1 % IJ SOLN
INTRAMUSCULAR | Status: AC
  Filled 2021-12-02: qty 60

## 2021-12-02 MED ORDER — ACETAMINOPHEN 325 MG PO TABS
325.0000 mg | ORAL_TABLET | ORAL | Status: DC | PRN
  Filled 2021-12-02: qty 2

## 2021-12-02 MED ORDER — CHLORHEXIDINE GLUCONATE 4 % EX LIQD
4.0000 | Freq: Once | CUTANEOUS | Status: DC
Start: 2021-12-02 — End: 2021-12-02

## 2021-12-02 MED ORDER — CEFAZOLIN SODIUM-DEXTROSE 2-4 GM/100ML-% IV SOLN
INTRAVENOUS | Status: AC
Start: 2021-12-02 — End: ?
  Filled 2021-12-02: qty 100

## 2021-12-02 SURGICAL SUPPLY — 4 items
CABLE SURGICAL S-101-97-12 (CABLE) ×3 IMPLANT
ICD EVERA DR XT MRI DDMB1D4 (ICD Generator) ×1 IMPLANT
PAD DEFIB RADIO PHYSIO CONN (PAD) ×3 IMPLANT
TRAY PACEMAKER INSERTION (PACKS) ×3 IMPLANT

## 2021-12-02 NOTE — H&P (Signed)
Primary Cardiologist:  Dr Telford Nab [name] is a 88 y.o. female with a h/o complete heart block sp ICD (MDT) in [place] by Dr Deno Etienne for nonischemic CM 09/2012 who presents today for generator replacement.  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 She has not had device therapy (primary prevention).  She has complete heart block and is dependant today.  She walks with a rolling walker and is in an assisted living facility.  She is not very active.  She has SOB with minimal activity.  Today, she  denies symptoms of palpitations, chest pain, dizziness, presyncope, syncope, or neurologic sequela.  The patientis tolerating medications without difficulties and is otherwise without complaint today.        Past Medical History:  Diagnosis Date   Arthritis      "comes and goes" (03/01/2015)   Asthma     CHF (congestive heart failure) (HCC)     Colon cancer (HCC)     Colostomy care ([place])     COPD (chronic obstructive pulmonary disease) ([place])     Diabetes mellitus without complication ([place])      pt denies this hx on 03/01/2015   HCAP (healthcare-associated pneumonia) 04/22/2017   History of blood transfusion 03/01/2015; 04/23/2017    "related to anemia,"   Hyperlipidemia     Hypertension     Hypothyroidism     Presence of combination internal cardiac defibrillator (ICD) and pacemaker      2005 in [place] MDT   Pulmonary embolism [facility])           Past Surgical History:  Procedure Laterality Date   ABDOMINAL HYSTERECTOMY       ABDOMINOPERINEAL PROCTOCOLECTOMY   2006    Rectal cancer   ANKLE FRACTURE SURGERY Left     CARDIAC CATHETERIZATION N/A 03/22/2015    Procedure: Right/Left Heart Cath and Coronary Angiography;  Surgeon: Leonie Man, MD;  Location: [place] [facility];  Service: Cardiovascular;  Laterality: N/A;   CATARACT EXTRACTION W/ INTRAOCULAR LENS  IMPLANT, BILATERAL Bilateral      CHOLECYSTECTOMY       DILATION AND CURETTAGE OF UTERUS       FRACTURE SURGERY       PARASTOMAL HERNIA REPAIR   2007    lysis of adhesions   TUBAL LIGATION          Social History         Socioeconomic History   Marital status: Divorced      Spouse name: Not on file   Number of children: Not on file   Years of education: Not on file   Highest education level: Not on file  Occupational History   Occupation: Retired Psychologist, counselling  Tobacco Use   Smoking status: Never Smoker   Smokeless tobacco: Never Used  Substance and Sexual Activity   Alcohol use: No   Drug use: No   Sexual activity: Never  Other Topics Concern   Not on file  Social History Narrative    12/19 was a resident of assisted living.  Three sons.  Divorced.    Social Determinants of Health       Financial Resource Strain:    Difficulty of Paying Living Expenses:   Food Insecurity:    Worried About Charity fundraiser in the Last Year:    Ran Out of Food in the Last  Year:   Transportation Needs:    Film/video editor (Medical):    Lack of Transportation (Non-Medical):   Physical Activity:    Days of Exercise per Week:    Minutes of Exercise per Session:   Stress:    Feeling of Stress :   Social Connections:    Frequency of Communication with Friends and Family:    Frequency of Social Gatherings with Friends and Family:    Attends Religious Services:    Active Member of Clubs or Organizations:    Attends Music therapist:    Marital Status:   Intimate Partner Violence:    Fear of Current or Ex-Partner:    Emotionally Abused:    Physically Abused:    Sexually Abused:            Family History  Problem Relation Age of Onset   CAD Other     Hypertension Other     Stroke Other     Heart disease Mother          No details   Colon cancer Neg Hx     GI Bleed Neg Hx        No Known Allergies         Current Outpatient Medications  Medication Sig Dispense Refill    acetaminophen (TYLENOL) 500 MG tablet Take 1,000 mg by mouth in the morning and at bedtime.        alum & mag hydroxide-simeth (MAALOX/MYLANTA) 200-200-20 MG/5ML suspension Take 30 mLs by mouth as needed for indigestion or heartburn (do not exceed 4 doses in 24hrs).       aspirin EC 81 MG tablet Take 1 tablet (81 mg total) by mouth every morning.       atorvastatin (LIPITOR) 20 MG tablet Take 20 mg by mouth at bedtime.        Calcium Carbonate-Vitamin D 600-400 MG-UNIT tablet Take 1 tablet by mouth 2 (two) times a day.       carboxymethylcellulose (REFRESH) 1 % ophthalmic solution Place 1 drop into both eyes 3 (three) times daily.        carvedilol (COREG) 6.25 MG tablet Take 6.25 mg by mouth 2 (two) times daily with a meal.        cyclobenzaprine (FLEXERIL) 5 MG tablet Take 5 mg by mouth at bedtime.       famotidine (PEPCID) 20 MG tablet Take 1 tablet (20 mg total) by mouth daily. 30 tablet 1   Fluticasone-Salmeterol (ADVAIR) 500-50 MCG/DOSE AEPB Inhale 1 puff into the lungs 2 (two) times daily. Rinse mouth after use       furosemide (LASIX) 20 MG tablet Take 1 tablet (20 mg total) by mouth daily as needed for edema (weight gain from fluid). 30 tablet 11   guaifenesin (ROBITUSSIN) 100 MG/5ML syrup Take 200 mg by mouth every 6 (six) hours as needed for cough.       hydrALAZINE (APRESOLINE) 25 MG tablet Take 1 tablet (25 mg total) by mouth every 8 (eight) hours. 90 tablet 1   iron polysaccharides (NIFEREX) 150 MG capsule Take 150 mg by mouth 2 (two) times daily.       isosorbide mononitrate (IMDUR) 60 MG 24 hr tablet Take 1 tablet (60 mg total) by mouth daily. 30 tablet 1   levothyroxine (SYNTHROID, LEVOTHROID) 50 MCG tablet Take 50 mcg by mouth daily.    2   loperamide (IMODIUM A-D) 2 MG tablet Take 2 mg by mouth as needed for  diarrhea or loose stools (do not exceed 8 doses in 24hrs).       loratadine (CLARITIN) 10 MG tablet Take 10 mg by mouth daily.       magnesium hydroxide (MILK OF MAGNESIA)  400 MG/5ML suspension Take 30 mLs by mouth at bedtime as needed for mild constipation.       neomycin-bacitracin-polymyxin (NEOSPORIN) [phone] ointment Apply 1 application topically as needed (skin cuts).       ondansetron (ZOFRAN) 4 MG tablet Take 1 tablet (4 mg total) by mouth every 6 (six) hours as needed for nausea or vomiting. 12 tablet 0   polyethylene glycol (MIRALAX / GLYCOLAX) packet Take 17 g by mouth daily. Mix with 8 ounces of fluid and drink        sodium chloride (OCEAN) 0.65 % SOLN nasal spray Place 1 spray into both nostrils as needed for congestion.        No current facility-administered medications for this visit.      ROS- all systems are reviewed and negative except as per HPI   Physical Exam: Vitals:   12/02/21 1016  BP: (!) 167/67  Pulse: 91  Resp: 17  Temp: 98.2 F (36.8 C)  SpO2: 98%    GEN- The patient is elderly appearing, alert  Head- normocephalic, atraumatic Eyes-  Sclera clear, conjunctiva pink Ears- hearing intact Oropharynx- clear Neck- supple, no JVP Lungs-   normal work of breathing Chest- ICD pocket is well healed Heart- Regular rate and rhythm  GI- soft  Extremities- + mild edema MS- diffuse atrophy Skin- no rash or lesion Psych- euthymic mood, full affect Neuro- strength and sensation are intact    Ekg today reviewed   Assessment and Plan:   1. Chronic combined systolic and diastolic dysfunction/ nonischemic CM/ complete heart block Clinically very stable.  EF has improved to 45%.  Given advanced age and fragility, I would be reluctant to upgrade to CRT.    ICD Criteria  Current LVEF:45%. Within 12 months prior to implant: Yes   Heart failure history: Yes, Class II  Cardiomyopathy history: Yes, Non-Ischemic Cardiomyopathy.  Atrial Fibrillation/Atrial Flutter: No.  Ventricular tachycardia history: No.  Cardiac arrest history: No.  History of syndromes with risk of sudden death: No.  Previous ICD: Yes, Reason for ICD:   Primary prevention.  Current ICD indication: Primary  PPM indication: Yes. Pacing type: Both. Less than 40% RV pacing requirement anticipated. Indication: Complete Heart Block  Class I or II Bradycardia indication present: Yes  Beta Blocker therapy for 3 or more months: Yes, prescribed.   Ace Inhibitor/ARB therapy for 3 or more months: No, medical reason.   I have seen [name] is a 88 y.o. female\who presents today with ICD at [facility].  Her device was initially implanted for primary prevention of sudden death.  She has known cardiomyopathy with prior ICD implanted by Dr Deno Etienne.  She has had some improvement in her EF with medical therapy.  She is stable clinically.  Given advanced age and fragility, the family and I have decided today to not upgrade to CRT.  I have had a thorough discussion with the patient and her son reviewing options.  The patient and son have had opportunities to ask questions and have them answered.  We discussed downgrade to a pacemaker as an option.  She has complete heart block.  The patient and I have decided together through the [facility] to proceed with replacement of her  ICD at this time.  Risks, benefits, alternatives to ICD generator change were discussed in detail with the patient today. The patient  understands that the risks include but are not limited to bleeding, infection, pneumothorax, perforation, tamponade, vascular damage, renal failure, MI, stroke, death, inappropriate shocks, and lead dislodgement and wishes to proceed.    Thompson Grayer MD, [facility] 12/02/2021 11:02 AM

## 2021-12-02 NOTE — Discharge Instructions (Signed)
Implantable Cardiac Device Battery Change, Care After Remove outer dressing in 24 hours   This sheet gives you information about how to care for yourself after your procedure. Your health care provider may also give you more specific instructions. If you have problems or questions, contact your health care provider. What can I expect after the procedure? After your procedure, it is common to have: Pain or soreness at the site where the cardiac device was inserted. Swelling at the site where the cardiac device was inserted. You should received an information card for your new device in 4-8 weeks. Follow these instructions at home: Incision care  Keep the incision clean and dry. Do not take baths, swim, or use a hot tub until after your wound check.  Do not shower for at least 7 days, or as directed by your health care provider. Pat the area dry with a clean towel. Do not rub the area. This may cause bleeding. Follow instructions from your health care provider about how to take care of your incision. Make sure you: Leave stitches (sutures), skin glue, or adhesive strips in place. These skin closures may need to stay in place for 2 weeks or longer. If adhesive strip edges start to loosen and curl up, you may trim the loose edges. Do not remove adhesive strips completely unless your health care provider tells you to do that. Check your incision area every day for signs of infection. Check for: More redness, swelling, or pain. More fluid or blood. Warmth. Pus or a bad smell. Activity Do not lift anything that is heavier than 10 lb (4.5 kg) until your health care provider says it is okay to do so. For the first week, or as long as told by your health care provider: Avoid lifting your affected arm higher than your shoulder. After 1 week, Be gentle when you move your arms over your head. It is okay to raise your arm to comb your hair. Avoid strenuous exercise. Ask your health care provider when  it is okay to: Resume your normal activities. Return to work or school. Resume sexual activity. Eating and drinking Eat a heart-healthy diet. This should include plenty of fresh fruits and vegetables, whole grains, low-fat dairy products, and lean protein like chicken and fish. Limit alcohol intake to no more than 1 drink a day for non-pregnant women and 2 drinks a day for men. One drink equals 12 oz of beer, 5 oz of wine, or 1 oz of hard liquor. Check ingredients and nutrition facts on packaged foods and beverages. Avoid the following types of food: Food that is high in salt (sodium). Food that is high in saturated fat, like full-fat dairy or red meat. Food that is high in trans fat, like fried food. Food and drinks that are high in sugar. Lifestyle Do not use any products that contain nicotine or tobacco, such as cigarettes and e-cigarettes. If you need help quitting, ask your health care provider. Take steps to manage and control your weight. Once cleared, get regular exercise. Aim for 150 minutes of moderate-intensity exercise (such as walking or yoga) or 75 minutes of vigorous exercise (such as running or swimming) each week. Manage other health problems, such as diabetes or high blood pressure. Ask your health care provider how you can manage these conditions. General instructions Do not drive for 24 hours after your procedure if you were given a medicine to help you relax (sedative). Take over-the-counter and prescription medicines only as told by  your health care provider. Avoid putting pressure on the area where the cardiac device was placed. If you need an MRI after your cardiac device has been placed, be sure to tell the health care provider who orders the MRI that you have a cardiac device. Avoid close and prolonged exposure to electrical devices that have strong magnetic fields. These include: Cell phones. Avoid keeping them in a pocket near the cardiac device, and try using the  ear opposite the cardiac device. MP3 players. Household appliances, like microwaves. Metal detectors. Electric generators. High-tension wires. Keep all follow-up visits as directed by your health care provider. This is important. Contact a health care provider if: You have pain at the incision site that is not relieved by over-the-counter or prescription medicines. You have any of these around your incision site or coming from it: More redness, swelling, or pain. Fluid or blood. Warmth to the touch. Pus or a bad smell. You have a fever. You feel brief, occasional palpitations, light-headedness, or any symptoms that you think might be related to your heart. Get help right away if: You experience chest pain that is different from the pain at the cardiac device site. You develop a red streak that extends above or below the incision site. You experience shortness of breath. You have palpitations or an irregular heartbeat. You have light-headedness that does not go away quickly. You faint or have dizzy spells. Your pulse suddenly drops or increases rapidly and does not return to normal. You begin to gain weight and your legs and ankles swell. Summary After your procedure, it is common to have pain, soreness, and some swelling where the cardiac device was inserted. Make sure to keep your incision clean and dry. Follow instructions from your health care provider about how to take care of your incision. Check your incision every day for signs of infection, such as more pain or swelling, pus or a bad smell, warmth, or leaking fluid and blood. Avoid strenuous exercise and lifting your left arm higher than your shoulder for 2 weeks, or as long as told by your health care provider. This information is not intended to replace advice given to you by your health care provider. Make sure you discuss any questions you have with your health care provider.

## 2021-12-14 ENCOUNTER — Other Ambulatory Visit: Payer: Self-pay

## 2021-12-14 ENCOUNTER — Ambulatory Visit (INDEPENDENT_AMBULATORY_CARE_PROVIDER_SITE_OTHER): Payer: Medicare HMO

## 2021-12-14 DIAGNOSIS — I428 Other cardiomyopathies: Secondary | ICD-10-CM | POA: Diagnosis not present

## 2021-12-14 LAB — CUP PACEART INCLINIC DEVICE CHECK
Battery Remaining Longevity: 104 mo
Battery Voltage: 3.08 V
Brady Statistic AP VP Percent: 0.03 %
Brady Statistic AP VS Percent: 0 %
Brady Statistic AS VP Percent: 99.95 %
Brady Statistic AS VS Percent: 0.02 %
Brady Statistic RA Percent Paced: 0.03 %
Brady Statistic RV Percent Paced: 99.98 %
Date Time Interrogation Session: 20230308194835
HighPow Impedance: 48 Ohm
Implantable Lead Implant Date: 20131216
Implantable Lead Implant Date: 20131216
Implantable Lead Location: 753859
Implantable Lead Location: 753860
Implantable Lead Model: 5076
Implantable Pulse Generator Implant Date: 20230224
Lead Channel Impedance Value: 247 Ohm
Lead Channel Impedance Value: 342 Ohm
Lead Channel Impedance Value: 361 Ohm
Lead Channel Pacing Threshold Amplitude: 0.5 V
Lead Channel Pacing Threshold Amplitude: 0.5 V
Lead Channel Pacing Threshold Pulse Width: 0.4 ms
Lead Channel Pacing Threshold Pulse Width: 0.4 ms
Lead Channel Sensing Intrinsic Amplitude: 1.25 mV
Lead Channel Sensing Intrinsic Amplitude: 2.25 mV
Lead Channel Setting Pacing Amplitude: 1.5 V
Lead Channel Setting Pacing Amplitude: 2 V
Lead Channel Setting Pacing Pulse Width: 0.4 ms
Lead Channel Setting Sensing Sensitivity: 0.3 mV

## 2021-12-14 NOTE — Progress Notes (Signed)
Wound check appointment s/p gen change 12/02/21. Steri-strips removed. Wound without redness or edema. Incision edges approximated, wound well healed. Normal device function. Thresholds, sensing, and impedances consistent with implant measurements. Device programmed for chronic lead settings. Histogram distribution appropriate for patient and level of activity. No mode switches or ventricular arrhythmias noted. Patient educated about wound care, arm mobility, lifting restrictions, shock plan. Patient enrolled in remote monitoring with next transmission scheduled 03/07/22. ROV in 3 months with implanting physician. ?

## 2021-12-14 NOTE — Patient Instructions (Addendum)
? ?  After Your ICD ?(Implantable Cardiac Defibrillator) ? ? ? ?Monitor your defibrillator site for redness, swelling, and drainage. Call the device clinic at [phone] if you experience these symptoms or fever/chills. ? ?Your incision was closed with Steri-strips or staples:  You may shower and wash your incision with soap and water. Avoid lotions, ointments, or perfumes over your incision until it is well-healed. ? ?You may use a hot tub or a pool after your wound check appointment if the incision is completely closed. ? ?Your ICD is MRI compatible. ? ?Your ICD is designed to protect you from life threatening heart rhythms. Because of this, you may receive a shock.  ? ?1 shock with no symptoms:  Call the office during business hours. ?1 shock with symptoms (chest pain, chest pressure, dizziness, lightheadedness, shortness of breath, overall feeling unwell):  Call 911. ?If you experience 2 or more shocks in 24 hours:  Call 911. ?If you receive a shock, you should not drive.  ?[place] DMV - no driving for 6 months if you receive appropriate therapy from your ICD.  ? ?ICD Alerts:  Some alerts are vibratory and others beep. These are NOT emergencies. Please call our office to let us know. If this occurs at night or on weekends, it can wait until the next business day. Send a remote transmission. ? ?If your device is capable of reading fluid status (for heart failure), you will be offered monthly monitoring to review this with you.  ? ?Remote monitoring is used to monitor your ICD from home. This monitoring is scheduled every 91 days by our office. It allows us to keep an eye on the functioning of your device to ensure it is working properly. You will routinely see your Electrophysiologist annually (more often if necessary).  ?

## 2022-02-10 ENCOUNTER — Encounter (HOSPITAL_COMMUNITY): Payer: Self-pay | Admitting: Emergency Medicine

## 2022-02-10 ENCOUNTER — Emergency Department (HOSPITAL_COMMUNITY)
Admission: EM | Admit: 2022-02-10 | Discharge: 2022-02-10 | Disposition: A | Discharge status: Home / Self Care | Payer: Medicare HMO | Attending: Emergency Medicine | Admitting: Emergency Medicine

## 2022-02-10 ENCOUNTER — Other Ambulatory Visit: Payer: Self-pay

## 2022-02-10 ENCOUNTER — Emergency Department (HOSPITAL_COMMUNITY): Payer: Medicare HMO

## 2022-02-10 DIAGNOSIS — Z7982 Long term (current) use of aspirin: Secondary | ICD-10-CM | POA: Diagnosis not present

## 2022-02-10 DIAGNOSIS — M25532 Pain in left wrist: Secondary | ICD-10-CM | POA: Diagnosis present

## 2022-02-10 DIAGNOSIS — M1990 Unspecified osteoarthritis, unspecified site: Secondary | ICD-10-CM

## 2022-02-10 DIAGNOSIS — Z79899 Other long term (current) drug therapy: Secondary | ICD-10-CM | POA: Diagnosis not present

## 2022-02-10 DIAGNOSIS — I10 Essential (primary) hypertension: Secondary | ICD-10-CM | POA: Diagnosis not present

## 2022-02-10 DIAGNOSIS — M1812 Unilateral primary osteoarthritis of first carpometacarpal joint, left hand: Secondary | ICD-10-CM | POA: Insufficient documentation

## 2022-02-10 LAB — CBC WITH DIFFERENTIAL/PLATELET
Abs Immature Granulocytes: 0.03 10*3/uL (ref 0.00–0.07)
Basophils Absolute: 0 10*3/uL (ref 0.0–0.1)
Basophils Relative: 0 %
Eosinophils Absolute: 0 10*3/uL (ref 0.0–0.5)
Eosinophils Relative: 0 %
HCT: 34.7 % — ABNORMAL LOW (ref 36.0–46.0)
Hemoglobin: 10.6 g/dL — ABNORMAL LOW (ref 12.0–15.0)
Immature Granulocytes: 0 %
Lymphocytes Relative: 17 %
Lymphs Abs: 1.8 10*3/uL (ref 0.7–4.0)
MCH: 30.8 pg (ref 26.0–34.0)
MCHC: 30.5 g/dL (ref 30.0–36.0)
MCV: 100.9 fL — ABNORMAL HIGH (ref 80.0–100.0)
Monocytes Absolute: 1.1 10*3/uL — ABNORMAL HIGH (ref 0.1–1.0)
Monocytes Relative: 11 %
Neutro Abs: 7.3 10*3/uL (ref 1.7–7.7)
Neutrophils Relative %: 72 %
Platelets: 235 10*3/uL (ref 150–400)
RBC: 3.44 MIL/uL — ABNORMAL LOW (ref 3.87–5.11)
RDW: 12.5 % (ref 11.5–15.5)
WBC: 10.3 10*3/uL (ref 4.0–10.5)
nRBC: 0 % (ref 0.0–0.2)

## 2022-02-10 LAB — BASIC METABOLIC PANEL
Anion gap: 10 (ref 5–15)
BUN: 29 mg/dL — ABNORMAL HIGH (ref 8–23)
CO2: 28 mmol/L (ref 22–32)
Calcium: 9.7 mg/dL (ref 8.9–10.3)
Chloride: 101 mmol/L (ref 98–111)
Creatinine, Ser: 1.55 mg/dL — ABNORMAL HIGH (ref 0.44–1.00)
GFR, Estimated: 33 mL/min — ABNORMAL LOW (ref >60)
Glucose, Bld: 92 mg/dL (ref 70–99)
Potassium: 4.5 mmol/L (ref 3.5–5.1)
Sodium: 139 mmol/L (ref 135–145)

## 2022-02-10 LAB — LACTIC ACID, PLASMA: Lactic Acid, Venous: 1.8 mmol/L (ref 0.5–1.9)

## 2022-02-10 MED ORDER — COLCHICINE 0.6 MG PO TABS
1.2000 mg | ORAL_TABLET | ORAL | Status: AC
  Administered 2022-02-10: 1.2 mg via ORAL
  Filled 2022-02-10: qty 2

## 2022-02-10 MED ORDER — COLCHICINE 0.6 MG PO TABS: 0.6000 mg | ORAL_TABLET | Freq: Two times a day (BID) | ORAL | 0 refills | Status: DC

## 2022-02-10 MED ORDER — COLCHICINE 0.6 MG PO TABS: 0.6000 mg | ORAL_TABLET | Freq: Two times a day (BID) | ORAL | 0 refills | Status: AC

## 2022-02-10 NOTE — ED Triage Notes (Signed)
Pt to the ED with [facility] from [facility] with a complaint of left wrist pain that is worse today. ? ?Pt has swelling with pain to the left arm and wrist. ? ?Pt is nonambulatory. ? ?

## 2022-02-10 NOTE — ED Provider Notes (Signed)
?[facility] ?Provider Note ? ? ?CSN: [id number] ?Arrival date & time: 02/10/22  1634 ? ?  ? ?History ? ?Chief Complaint  ?Patient presents with  ? Wrist Pain  ? ? ?[name] is a 88 y.o. female. ? ? ?Wrist Pain ? ?This patient is an 88 year old female, she does not have a history of diabetes, she currently lives at the [facility] and presents with having progressive pain and swelling of her left wrist over the last couple of weeks.  She was told by family that she may have a history of gout, the patient cannot recall.  She has not had any fevers or chills, she does not recall any injuries to her wrist.  She has no pain in her legs no chest pain shortness of breath coughing or fevers.  Paramedics reported normal vital signs prehospital and shared those with me.  Medication list does not reveal any anticoagulants and nothing for gout ? ?Home Medications ?Prior to Admission medications   ?Medication Sig Start Date End Date Taking? Authorizing Provider  ?colchicine 0.6 MG tablet Take 1 tablet (0.6 mg total) by mouth 2 (two) times daily for 7 days. 02/10/22 02/17/22 Yes Noemi Chapel, MD  ?acetaminophen (TYLENOL) 500 MG tablet Take 500-1,000 mg by mouth See admin instructions. Take 2 tablets (1000 mg) by mouth every morning, may also take 1 tablet (500  mg) every 6 hours as needed for fever 99.5-101 F, minor headache, minor discomfort    [provider]  ?aspirin EC 81 MG tablet Take 1 tablet (81 mg total) by mouth every morning. 03/03/15   Rai, Ripudeep K, MD  ?carboxymethylcellulose 1 % ophthalmic solution Place 1 drop into both eyes 3 (three) times daily.     [provider]  ?carvedilol (COREG) 6.25 MG tablet Take 6.25 mg by mouth 2 (two) times daily with a meal.     [provider]  ?cyclobenzaprine (FLEXERIL) 5 MG tablet Take 5 mg by mouth at bedtime.    [provider]  ?famotidine (PEPCID) 20 MG tablet Take 1 tablet (20 mg total) by mouth daily. 02/08/20    Annita Brod, MD  ?fluticasone-salmeterol (ADVAIR) 500-50 MCG/ACT AEPB Inhale 1 puff into the lungs in the morning and at bedtime.    [provider]  ?furosemide (LASIX) 80 MG tablet Take 80 mg by mouth daily.    [provider]  ?hydrALAZINE (APRESOLINE) 50 MG tablet Take 1 tablet (50 mg total) by mouth 3 (three) times daily. 03/15/21   Shelly Coss, MD  ?iron polysaccharides (NIFEREX) 150 MG capsule Take 150 mg by mouth 2 (two) times daily.    [provider]  ?isosorbide mononitrate (IMDUR) 60 MG 24 hr tablet Take 1 tablet (60 mg total) by mouth daily. 02/08/20   Annita Brod, MD  ?levothyroxine (SYNTHROID, LEVOTHROID) 50 MCG tablet Take 50 mcg by mouth daily at 6 (six) AM. 01/16/15   [provider]  ?loperamide (IMODIUM) 2 MG capsule Take 2 mg by mouth as needed for diarrhea or loose stools (max 8 doses/24 hrs.).    [provider]  ?loratadine (CLARITIN) 10 MG tablet Take 10 mg by mouth daily.    [provider]  ?meclizine (ANTIVERT) 25 MG tablet Take 25 mg by mouth every 8 (eight) hours as needed for dizziness.    [provider]  ?neomycin-bacitracin-polymyxin (NEOSPORIN) ointment Apply 1 application topically as needed for wound care.    [provider]  ?nystatin (  MYCOSTATIN/NYSTOP) powder Apply 1 application topically See admin instructions. Apply to rash around stoma on stoma care days, when replacing bag - on Mondays    [provider]  ?ondansetron (ZOFRAN) 4 MG tablet Take 1 tablet (4 mg total) by mouth every 6 (six) hours as needed for nausea or vomiting. 0/25/42   Delora Fuel, MD  ?polyethylene glycol (MIRALAX / Floria Raveling) packet Take 17 g by mouth daily as needed (constipation). Mix with 8 ounces of fluid and drink    [provider]  ?potassium chloride (MICRO-K) 10 MEQ CR capsule Take 10 mEq by mouth 2 (two) times daily.    [provider]  ?RESTASIS 0.05 % ophthalmic emulsion Place 1  drop into both eyes 2 (two) times daily. 10/27/21   [provider]  ?senna (SENOKOT) 8.6 MG TABS tablet Take 1 tablet by mouth at bedtime.    [provider]  ?vitamin C (ASCORBIC ACID) 250 MG tablet Take 250 mg by mouth 2 (two) times daily.    [provider]  ?   ? ?Allergies    ?Patient has no known allergies.   ? ?Review of Systems   ?Review of Systems  ?All other systems reviewed and are negative. ? ?Physical Exam ?Updated Vital Signs ?BP (!) 145/55   Pulse 92   Temp 98.1 ?F (36.7 ?C) (Oral)   Resp 16   Ht 1.676 m ('5\' 6"'$ )   Wt 86.2 kg   SpO2 92%   BMI 30.67 kg/m?  ?Physical Exam ?Vitals and nursing note reviewed.  ?Constitutional:   ?   General: She is not in acute distress. ?   Appearance: She is well-developed.  ?HENT:  ?   Head: Normocephalic and atraumatic.  ?   Mouth/Throat:  ?   Pharynx: No oropharyngeal exudate.  ?Eyes:  ?   General: No scleral icterus.    ?   Right eye: No discharge.     ?   Left eye: No discharge.  ?   Conjunctiva/sclera: Conjunctivae normal.  ?   Pupils: Pupils are equal, round, and reactive to light.  ?Neck:  ?   Thyroid: No thyromegaly.  ?   Vascular: No JVD.  ?Cardiovascular:  ?   Rate and Rhythm: Normal rate and regular rhythm.  ?   Heart sounds: Murmur heard.  ?  No friction rub. No gallop.  ?   Comments: Soft systolic murmur ?Pulmonary:  ?   Effort: Pulmonary effort is normal. No respiratory distress.  ?   Breath sounds: Normal breath sounds. No wheezing or rales.  ?Abdominal:  ?   General: Bowel sounds are normal. There is no distension.  ?   Palpations: Abdomen is soft. There is no mass.  ?   Tenderness: There is no abdominal tenderness.  ?Musculoskeletal:     ?   General: No tenderness. Normal range of motion.  ?   Cervical back: Normal range of motion and neck supple.  ?Lymphadenopathy:  ?   Cervical: No cervical adenopathy.  ?Skin: ?   General: Skin is warm and dry.  ?   Findings: No erythema or rash.  ?   Comments: She has a warm red  swollen left distal forearm and wrist.  She has difficulty opening and closing her hand because of pain in the wrist.  There is no pain or swelling in the elbow or the shoulder on the left.  No other joint arthropathies  ?Neurological:  ?   Mental Status: She is  alert.  ?   Coordination: Coordination normal.  ?   Comments: The patient knows her name the location and the circumstances of why she is here, able to follow commands without difficulty except for her left wrist because of pain and swelling  ?Psychiatric:     ?   Behavior: Behavior normal.  ? ? ?ED Results / Procedures / Treatments   ?Labs ?(all labs ordered are listed, but only abnormal results are displayed) ?Labs Reviewed  ?CBC WITH DIFFERENTIAL/PLATELET - Abnormal; Notable for the following components:  ?    Result Value  ? RBC 3.44 (*)   ? Hemoglobin 10.6 (*)   ? HCT 34.7 (*)   ? MCV 100.9 (*)   ? Monocytes Absolute 1.1 (*)   ? All other components within normal limits  ?BASIC METABOLIC PANEL - Abnormal; Notable for the following components:  ? BUN 29 (*)   ? Creatinine, Ser 1.55 (*)   ? GFR, Estimated 33 (*)   ? All other components within normal limits  ?LACTIC ACID, PLASMA  ? ? ?EKG ?None ? ?Radiology ?DG Wrist Complete Left ? ?Result Date: 02/10/2022 ?CLINICAL DATA:  Pain and swelling EXAM: LEFT WRIST - COMPLETE 3 VIEW COMPARISON:  02/16/2017 FINDINGS: No recent fracture or dislocation is seen. Degenerative changes are noted with bony spurs in first carpometacarpal joint. IMPRESSION: No fracture or dislocation is seen in the left wrist. Degenerative changes are noted in first carpometacarpal joint. Electronically Signed   By: Elmer Picker M.D.   On: 02/10/2022 18:29   ? ?Procedures ?Procedures  ? ? ?Medications Ordered in ED ?Medications  ?colchicine tablet 1.2 mg (1.2 mg Oral Given 02/10/22 1700)  ? ? ?ED Course/ Medical Decision Making/ A&P ?  ?                        ?Medical Decision Making ?Amount and/or Complexity of Data Reviewed ?Labs:  ordered. ? ?Risk ?Prescription drug management. ? ? ?This patient presents to the ED for concern of what appears to be a swollen red hot wrist, this involves an extensive number of treatment options, and is a complain

## 2022-02-10 NOTE — Discharge Instructions (Signed)
Your pain and swelling and redness is likely coming from gout.  Your blood work was reassuring, there is no signs of infection, you do not have a fever, your heart rate is not fast. ? ?Please take colchicine 1 tablet twice a day for 1 week, once the pain and redness and swelling is gone you may discontinue this medication and be aware that it may cause diarrhea if taken for too long. ? ?Please have your family doctor recheck you within 48 hours. ? ?If you develop severe or worsening pain redness or fever please return to the ER immediately ?

## 2022-03-24 ENCOUNTER — Encounter: Payer: Medicare HMO | Admitting: Internal Medicine

## 2022-11-25 ENCOUNTER — Emergency Department (HOSPITAL_COMMUNITY): Payer: Medicare HMO

## 2022-11-25 ENCOUNTER — Other Ambulatory Visit: Payer: Self-pay

## 2022-11-25 ENCOUNTER — Encounter (HOSPITAL_COMMUNITY): Payer: Self-pay

## 2022-11-25 ENCOUNTER — Emergency Department (HOSPITAL_COMMUNITY)
Admission: EM | Admit: 2022-11-25 | Discharge: 2022-11-26 | Disposition: A | Discharge status: Home / Self Care | Payer: Medicare HMO | Attending: Emergency Medicine | Admitting: Emergency Medicine

## 2022-11-25 DIAGNOSIS — S0990XA Unspecified injury of head, initial encounter: Secondary | ICD-10-CM

## 2022-11-25 DIAGNOSIS — R000 Tachycardia, unspecified: Secondary | ICD-10-CM | POA: Diagnosis not present

## 2022-11-25 DIAGNOSIS — Y9301 Activity, walking, marching and hiking: Secondary | ICD-10-CM | POA: Diagnosis not present

## 2022-11-25 DIAGNOSIS — S0083XA Contusion of other part of head, initial encounter: Secondary | ICD-10-CM | POA: Insufficient documentation

## 2022-11-25 DIAGNOSIS — W01198A Fall on same level from slipping, tripping and stumbling with subsequent striking against other object, initial encounter: Secondary | ICD-10-CM | POA: Diagnosis not present

## 2022-11-25 DIAGNOSIS — M79632 Pain in left forearm: Secondary | ICD-10-CM | POA: Insufficient documentation

## 2022-11-25 DIAGNOSIS — Y92002 Bathroom of unspecified non-institutional (private) residence single-family (private) house as the place of occurrence of the external cause: Secondary | ICD-10-CM | POA: Insufficient documentation

## 2022-11-25 DIAGNOSIS — W19XXXA Unspecified fall, initial encounter: Secondary | ICD-10-CM

## 2022-11-25 DIAGNOSIS — Z7982 Long term (current) use of aspirin: Secondary | ICD-10-CM | POA: Insufficient documentation

## 2022-11-25 NOTE — ED Triage Notes (Signed)
[facility] from [facility].. cc of a fall. Pt was walking back form the bathroom when she stumbled over her feet and fell. Has small hematoma to forehead. Per ems pt at facility

## 2022-11-25 NOTE — ED Provider Notes (Signed)
[facility]  Provider Note  CSN: [id number] Arrival date & time: 11/25/22 2205  History Chief Complaint  Patient presents with   [name] is a 89 y.o. female with history of multiple medical problems brougth to the ED from [facility] after she lost her balance and fell coming back from the bathroom. She is not on anticoagulation. She reports she hit her forehead, but did not have LOC. She also reports L forearm pain to me.    Home Medications Prior to Admission medications   Medication Sig Start Date End Date Taking? Authorizing Provider  acetaminophen (TYLENOL) 500 MG tablet Take 500-1,000 mg by mouth See admin instructions. Take 2 tablets (1000 mg) by mouth every morning, may also take 1 tablet (500  mg) every 6 hours as needed for fever 99.5-101 F, minor headache, minor discomfort    [provider]  aspirin EC 81 MG tablet Take 1 tablet (81 mg total) by mouth every morning. 03/03/15   Rai, Ripudeep K, MD  carboxymethylcellulose 1 % ophthalmic solution Place 1 drop into both eyes 3 (three) times daily.     [provider]  carvedilol (COREG) 6.25 MG tablet Take 6.25 mg by mouth 2 (two) times daily with a meal.     [provider]  colchicine 0.6 MG tablet Take 1 tablet (0.6 mg total) by mouth 2 (two) times daily for 7 days. 02/10/22 02/17/22  Noemi Chapel, MD  cyclobenzaprine (FLEXERIL) 5 MG tablet Take 5 mg by mouth at bedtime.    [provider]  famotidine (PEPCID) 20 MG tablet Take 1 tablet (20 mg total) by mouth daily. 02/08/20   Annita Brod, MD  fluticasone-salmeterol (ADVAIR) 500-50 MCG/ACT AEPB Inhale 1 puff into the lungs in the morning and at bedtime.    [provider]  furosemide (LASIX) 80 MG tablet Take 80 mg by mouth daily.    [provider]  hydrALAZINE (APRESOLINE) 50 MG tablet Take 1 tablet (50 mg total) by mouth 3 (three) times daily. 03/15/21    Shelly Coss, MD  iron polysaccharides (NIFEREX) 150 MG capsule Take 150 mg by mouth 2 (two) times daily.    [provider]  isosorbide mononitrate (IMDUR) 60 MG 24 hr tablet Take 1 tablet (60 mg total) by mouth daily. 02/08/20   Annita Brod, MD  levothyroxine (SYNTHROID, LEVOTHROID) 50 MCG tablet Take 50 mcg by mouth daily at 6 (six) AM. 01/16/15   [provider]  loperamide (IMODIUM) 2 MG capsule Take 2 mg by mouth as needed for diarrhea or loose stools (max 8 doses/24 hrs.).    [provider]  loratadine (CLARITIN) 10 MG tablet Take 10 mg by mouth daily.    [provider]  meclizine (ANTIVERT) 25 MG tablet Take 25 mg by mouth every 8 (eight) hours as needed for dizziness.    [provider]  neomycin-bacitracin-polymyxin (NEOSPORIN) ointment Apply 1 application topically as needed for wound care.    [provider]  nystatin (MYCOSTATIN/NYSTOP) powder Apply 1 application topically See admin instructions. Apply to rash around stoma on stoma care days, when replacing bag - on Mondays    [provider]  ondansetron (ZOFRAN) 4 MG tablet Take 1 tablet (4 mg total) by mouth every 6 (six) hours as needed for nausea or vomiting. [id number]   Delora Fuel, MD  polyethylene glycol (MIRALAX / Floria Raveling) packet Take 17 g by  mouth daily as needed (constipation). Mix with 8 ounces of fluid and drink    [provider]  potassium chloride (MICRO-K) 10 MEQ CR capsule Take 10 mEq by mouth 2 (two) times daily.    [provider]  RESTASIS 0.05 % ophthalmic emulsion Place 1 drop into both eyes 2 (two) times daily. 10/27/21   [provider]  senna (SENOKOT) 8.6 MG TABS tablet Take 1 tablet by mouth at bedtime.    [provider]  vitamin C (ASCORBIC ACID) 250 MG tablet Take 250 mg by mouth 2 (two) times daily.    [provider]     Allergies    Patient has no known allergies.   Review of Systems    Review of Systems Please see HPI for pertinent positives and negatives  Physical Exam BP (!) 127/49   Pulse 75   Temp 99.3 F (37.4 C)   Resp 14   Ht 5' 6"$  (1.676 m)   Wt 86.2 kg   SpO2 93%   BMI 30.67 kg/m   Physical Exam Vitals and nursing note reviewed.  Constitutional:      Appearance: Normal appearance.  HENT:     Head: Normocephalic.     Comments: Forehead contusion    Nose: Nose normal.     Mouth/Throat:     Mouth: Mucous membranes are moist.  Eyes:     Extraocular Movements: Extraocular movements intact.     Conjunctiva/sclera: Conjunctivae normal.  Cardiovascular:     Rate and Rhythm: Normal rate.     Pulses: Normal pulses.  Pulmonary:     Effort: Pulmonary effort is normal.     Breath sounds: Normal breath sounds.  Abdominal:     General: Abdomen is flat.     Palpations: Abdomen is soft.     Tenderness: There is no abdominal tenderness.  Musculoskeletal:        General: Tenderness (L forearm) present. No swelling or deformity. Normal range of motion.     Cervical back: Neck supple. No tenderness.  Skin:    General: Skin is warm and dry.  Neurological:     General: No focal deficit present.     Mental Status: She is alert.  Psychiatric:        Mood and Affect: Mood normal.     ED Results / Procedures / Treatments   EKG None  Procedures Procedures  Medications Ordered in the ED Medications - No data to display  Initial Impression and Plan  Patient here via EMS from ALF for reported fall, no LOC or syncope. No other acute symptoms. Will check imaging of her areas of concern.   ED Course   Clinical Course as of 11/26/22 0048  Sat Nov 25, 2022  2349 I personally viewed the images from radiology studies and agree with radiologist interpretation: CT neg for acute injury [CS]  Sun Nov 26, 2022  0018 I personally viewed the images from radiology studies and agree with radiologist interpretation: Xray forearm is negative. Will plan return to ALF  when transportation is available. PCP follow up, RTED for any other concerns.   [CS]    Clinical Course User Index [CS] Truddie Hidden, MD     MDM Rules/Calculators/A&P Medical Decision Making Problems Addressed: Fall, initial encounter: acute illness or injury Injury of head, initial encounter: acute illness or injury  Amount and/or Complexity of Data Reviewed Radiology: ordered and independent interpretation performed. Decision-making details documented in ED Course.  Final Clinical Impression(s) / ED Diagnoses Final diagnoses:  Fall, initial encounter  Injury of head, initial encounter    Rx / DC Orders ED Discharge Orders     None        Truddie Hidden, MD 11/26/22 [phone]

## 2022-11-26 LAB — URINALYSIS, ROUTINE W REFLEX MICROSCOPIC
Bilirubin Urine: NEGATIVE
Glucose, UA: NEGATIVE mg/dL
Hgb urine dipstick: NEGATIVE
Ketones, ur: NEGATIVE mg/dL
Nitrite: NEGATIVE
Protein, ur: 30 mg/dL — AB
Specific Gravity, Urine: 1.014 (ref 1.005–1.030)
pH: 7 (ref 5.0–8.0)

## 2022-11-26 MED ORDER — ACETAMINOPHEN 500 MG PO TABS
1000.0000 mg | ORAL_TABLET | Freq: Once | ORAL | Status: AC
  Administered 2022-11-26: 1000 mg via ORAL
  Filled 2022-11-26: qty 2

## 2022-11-26 NOTE — ED Provider Notes (Signed)
While awaiting transfer back to her nursing facility the patient was found to have a fever.  She had no complaints, was awake, alert, stating that she felt fine.  She had minimal tachycardia, this improved in both regards with Tylenol.  Urinalysis performed, this was unremarkable.  Without any ongoing complaints, though she had mild fever, she is appropriate for further evaluation as an outpatient as needed.   Carmin Muskrat, MD 11/26/22 [phone]

## 2022-11-26 NOTE — ED Notes (Signed)
Facility to pick up pt in am.

## 2022-11-27 ENCOUNTER — Emergency Department (HOSPITAL_COMMUNITY): Payer: Medicare HMO

## 2022-11-27 ENCOUNTER — Other Ambulatory Visit: Payer: Self-pay

## 2022-11-27 ENCOUNTER — Encounter (HOSPITAL_COMMUNITY): Payer: Self-pay

## 2022-11-27 ENCOUNTER — Emergency Department (HOSPITAL_COMMUNITY)
Admission: EM | Admit: 2022-11-27 | Discharge: 2022-11-27 | Disposition: A | Discharge status: Skilled Nursing Facility | Payer: Medicare HMO | Attending: Emergency Medicine | Admitting: Emergency Medicine

## 2022-11-27 ENCOUNTER — Other Ambulatory Visit (HOSPITAL_COMMUNITY): Payer: Self-pay

## 2022-11-27 DIAGNOSIS — Z7982 Long term (current) use of aspirin: Secondary | ICD-10-CM | POA: Insufficient documentation

## 2022-11-27 DIAGNOSIS — U071 COVID-19: Secondary | ICD-10-CM | POA: Insufficient documentation

## 2022-11-27 DIAGNOSIS — R519 Headache, unspecified: Secondary | ICD-10-CM | POA: Diagnosis present

## 2022-11-27 DIAGNOSIS — F0390 Unspecified dementia without behavioral disturbance: Secondary | ICD-10-CM | POA: Diagnosis not present

## 2022-11-27 DIAGNOSIS — G44319 Acute post-traumatic headache, not intractable: Secondary | ICD-10-CM | POA: Insufficient documentation

## 2022-11-27 LAB — CBC WITH DIFFERENTIAL/PLATELET
Abs Immature Granulocytes: 0.05 10*3/uL (ref 0.00–0.07)
Basophils Absolute: 0 10*3/uL (ref 0.0–0.1)
Basophils Relative: 0 %
Eosinophils Absolute: 0 10*3/uL (ref 0.0–0.5)
Eosinophils Relative: 0 %
HCT: 34.4 % — ABNORMAL LOW (ref 36.0–46.0)
Hemoglobin: 10.4 g/dL — ABNORMAL LOW (ref 12.0–15.0)
Immature Granulocytes: 1 %
Lymphocytes Relative: 14 %
Lymphs Abs: 1.2 10*3/uL (ref 0.7–4.0)
MCH: 30.9 pg (ref 26.0–34.0)
MCHC: 30.2 g/dL (ref 30.0–36.0)
MCV: 102.1 fL — ABNORMAL HIGH (ref 80.0–100.0)
Monocytes Absolute: 1 10*3/uL (ref 0.1–1.0)
Monocytes Relative: 11 %
Neutro Abs: 6.7 10*3/uL (ref 1.7–7.7)
Neutrophils Relative %: 74 %
Platelets: 211 10*3/uL (ref 150–400)
RBC: 3.37 MIL/uL — ABNORMAL LOW (ref 3.87–5.11)
RDW: 13.1 % (ref 11.5–15.5)
WBC: 9 10*3/uL (ref 4.0–10.5)
nRBC: 0 % (ref 0.0–0.2)

## 2022-11-27 LAB — BASIC METABOLIC PANEL
Anion gap: 9 (ref 5–15)
BUN: 27 mg/dL — ABNORMAL HIGH (ref 8–23)
CO2: 26 mmol/L (ref 22–32)
Calcium: 9 mg/dL (ref 8.9–10.3)
Chloride: 102 mmol/L (ref 98–111)
Creatinine, Ser: 1.56 mg/dL — ABNORMAL HIGH (ref 0.44–1.00)
GFR, Estimated: 32 mL/min — ABNORMAL LOW (ref >60)
Glucose, Bld: 111 mg/dL — ABNORMAL HIGH (ref 70–99)
Potassium: 4.3 mmol/L (ref 3.5–5.1)
Sodium: 137 mmol/L (ref 135–145)

## 2022-11-27 LAB — RESP PANEL BY RT-PCR (RSV, FLU A&B, COVID)  RVPGX2
Influenza A by PCR: NEGATIVE
Influenza B by PCR: NEGATIVE
Resp Syncytial Virus by PCR: NEGATIVE
SARS Coronavirus 2 by RT PCR: POSITIVE — AB

## 2022-11-27 MED ORDER — PAXLOVID (150/100) 10 X 150 MG & 10 X 100MG PO TBPK: 2.0000 | ORAL_TABLET | Freq: Two times a day (BID) | ORAL | 0 refills | Status: AC

## 2022-11-27 MED ORDER — ACETAMINOPHEN 325 MG PO TABS
650.0000 mg | ORAL_TABLET | Freq: Once | ORAL | Status: AC
  Administered 2022-11-27: 650 mg via ORAL
  Filled 2022-11-27: qty 2

## 2022-11-27 MED ORDER — PAXLOVID (300/100) 20 X 150 MG & 10 X 100MG PO TBPK: 3.0000 | ORAL_TABLET | Freq: Two times a day (BID) | ORAL | 0 refills | Status: DC

## 2022-11-27 NOTE — ED Triage Notes (Signed)
Patient arrives from [place], staff says her BP was in the [id number] systolically this morning. Medics were able to get a sysolic BP of [id number]. Patient was seen for a fall yesterday and still has some discomfort in her head

## 2022-11-27 NOTE — TOC Benefit Eligibility Note (Signed)
Patient Teacher, English as a foreign language completed.    The patient is currently admitted and upon discharge could be taking Paxlovid 10 x 150 mg tb.  The current 10 day co-pay is $0.00.   The patient is insured through [employer] [place] Patient Advocate Specialist [place] Patient Advocate Team Direct Number: [phone]  Fax: [phone]

## 2022-11-27 NOTE — ED Provider Notes (Signed)
[facility] Provider Note   CSN: [id number] Arrival date & time: 11/27/22  1047     History  Chief Complaint  Patient presents with   Dizziness    [name] is a 89 y.o. female.  Patient was brought in by EMS from her facility for possible low blood pressure.  Patient was seen 2 days ago for a fall hitting her head had a negative head CT.  She did have a fever prior to return back to her facility.  She is complaining of a headache for the last 2 days.  She has tried nothing for it.  She denies any other complaints.  She does not recall falling.  She does have a history of dementia, level 5 caveat  The history is provided by the patient.  Headache Pain location:  Generalized Severity currently:  Unable to specify Severity at highest:  Unable to specify Duration:  2 days Relieved by:  None tried Worsened by:  Nothing Ineffective treatments:  None tried Associated symptoms: no abdominal pain, no cough and no vomiting        Home Medications Prior to Admission medications   Medication Sig Start Date End Date Taking? Authorizing Provider  acetaminophen (TYLENOL) 500 MG tablet Take 500-1,000 mg by mouth See admin instructions. Take 2 tablets (1000 mg) by mouth every morning, may also take 1 tablet (500  mg) every 6 hours as needed for fever 99.5-101 F, minor headache, minor discomfort    [provider]  aspirin EC 81 MG tablet Take 1 tablet (81 mg total) by mouth every morning. 03/03/15   Rai, Ripudeep K, MD  carboxymethylcellulose 1 % ophthalmic solution Place 1 drop into both eyes 3 (three) times daily.     [provider]  carvedilol (COREG) 6.25 MG tablet Take 6.25 mg by mouth 2 (two) times daily with a meal.     [provider]  colchicine 0.6 MG tablet Take 1 tablet (0.6 mg total) by mouth 2 (two) times daily for 7 days. 02/10/22 02/17/22  Noemi Chapel, MD  cyclobenzaprine (FLEXERIL) 5 MG tablet Take 5 mg  by mouth at bedtime.    [provider]  famotidine (PEPCID) 20 MG tablet Take 1 tablet (20 mg total) by mouth daily. 02/08/20   Annita Brod, MD  fluticasone-salmeterol (ADVAIR) 500-50 MCG/ACT AEPB Inhale 1 puff into the lungs in the morning and at bedtime.    [provider]  furosemide (LASIX) 80 MG tablet Take 80 mg by mouth daily.    [provider]  hydrALAZINE (APRESOLINE) 50 MG tablet Take 1 tablet (50 mg total) by mouth 3 (three) times daily. 03/15/21   Shelly Coss, MD  iron polysaccharides (NIFEREX) 150 MG capsule Take 150 mg by mouth 2 (two) times daily.    [provider]  isosorbide mononitrate (IMDUR) 60 MG 24 hr tablet Take 1 tablet (60 mg total) by mouth daily. 02/08/20   Annita Brod, MD  levothyroxine (SYNTHROID, LEVOTHROID) 50 MCG tablet Take 50 mcg by mouth daily at 6 (six) AM. 01/16/15   [provider]  loperamide (IMODIUM) 2 MG capsule Take 2 mg by mouth as needed for diarrhea or loose stools (max 8 doses/24 hrs.).    [provider]  loratadine (CLARITIN) 10 MG tablet Take 10 mg by mouth daily.    [provider]  meclizine (ANTIVERT) 25 MG tablet Take 25 mg by mouth every 8 (eight) hours  as needed for dizziness.    [provider]  neomycin-bacitracin-polymyxin (NEOSPORIN) ointment Apply 1 application topically as needed for wound care.    [provider]  nystatin (MYCOSTATIN/NYSTOP) powder Apply 1 application topically See admin instructions. Apply to rash around stoma on stoma care days, when replacing bag - on Mondays    [provider]  ondansetron (ZOFRAN) 4 MG tablet Take 1 tablet (4 mg total) by mouth every 6 (six) hours as needed for nausea or vomiting. [id number]   Delora Fuel, MD  polyethylene glycol [facility] Floria Raveling) packet Take 17 g by mouth daily as needed (constipation). Mix with 8 ounces of fluid and drink    [provider]  potassium chloride (MICRO-K)  10 MEQ CR capsule Take 10 mEq by mouth 2 (two) times daily.    [provider]  RESTASIS 0.05 % ophthalmic emulsion Place 1 drop into both eyes 2 (two) times daily. 10/27/21   [provider]  senna (SENOKOT) 8.6 MG TABS tablet Take 1 tablet by mouth at bedtime.    [provider]  vitamin C (ASCORBIC ACID) 250 MG tablet Take 250 mg by mouth 2 (two) times daily.    [provider]      Allergies    Patient has no known allergies.    Review of Systems   Review of Systems  Unable to perform ROS: Dementia  Respiratory:  Negative for cough.   Gastrointestinal:  Negative for abdominal pain and vomiting.  Neurological:  Positive for headaches.    Physical Exam Updated Vital Signs BP (!) 127/55 (BP Location: Left Arm)   Pulse 78   Temp 98.3 F (36.8 C) (Oral)   Resp 16   Ht 5' 6"$  (1.676 m)   Wt 86.2 kg   SpO2 97%   BMI 30.67 kg/m  Physical Exam Vitals and nursing note reviewed.  Constitutional:      General: She is not in acute distress.    Appearance: Normal appearance. She is well-developed.  HENT:     Head: Normocephalic and atraumatic.  Eyes:     Conjunctiva/sclera: Conjunctivae normal.  Cardiovascular:     Rate and Rhythm: Normal rate and regular rhythm.     Heart sounds: No murmur heard. Pulmonary:     Effort: Pulmonary effort is normal. No respiratory distress.     Breath sounds: Normal breath sounds.  Abdominal:     Palpations: Abdomen is soft.     Tenderness: There is no abdominal tenderness. There is no guarding or rebound.  Musculoskeletal:        General: No deformity.     Cervical back: Neck supple.  Skin:    General: Skin is warm and dry.     Capillary Refill: Capillary refill takes less than 2 seconds.  Neurological:     General: No focal deficit present.     Mental Status: She is alert. She is disoriented.     Sensory: No sensory deficit.     Motor: No weakness.     ED Results / Procedures / Treatments    Labs (all labs ordered are listed, but only abnormal results are displayed) Labs Reviewed  RESP PANEL BY RT-PCR (RSV, FLU A&B, COVID)  RVPGX2 - Abnormal; Notable for the following components:      Result Value   SARS Coronavirus 2 by RT PCR POSITIVE (*)    All other components within normal limits  BASIC METABOLIC PANEL - Abnormal; Notable for the following components:  Glucose, Bld 111 (*)    BUN 27 (*)    Creatinine, Ser 1.56 (*)    GFR, Estimated 32 (*)    All other components within normal limits  CBC WITH DIFFERENTIAL/PLATELET - Abnormal; Notable for the following components:   RBC 3.37 (*)    Hemoglobin 10.4 (*)    HCT 34.4 (*)    MCV 102.1 (*)    All other components within normal limits  URINALYSIS, ROUTINE W REFLEX MICROSCOPIC    EKG None  Radiology DG Forearm Left  Result Date: 11/26/2022 CLINICAL DATA:  fall, L forearm pain EXAM: LEFT FOREARM - 2 VIEW COMPARISON:  None Available. FINDINGS: There is no evidence of fracture or other focal bone lesions. Grossly unremarkable wrist and elbow. Soft tissues are unremarkable. IMPRESSION: Negative. Electronically Signed   By: Iven Finn M.D.   On: 11/26/2022 00:06   CT Head Wo Contrast  Result Date: 11/25/2022 CLINICAL DATA:  Recent fall with headaches and neck pain, initial encounter EXAM: CT HEAD WITHOUT CONTRAST CT CERVICAL SPINE WITHOUT CONTRAST TECHNIQUE: Multidetector CT imaging of the head and cervical spine was performed following the standard protocol without intravenous contrast. Multiplanar CT image reconstructions of the cervical spine were also generated. RADIATION DOSE REDUCTION: This exam was performed according to the departmental dose-optimization program which includes automated exposure control, adjustment of the mA and/or kV according to patient size and/or use of iterative reconstruction technique. COMPARISON:  None Available. FINDINGS: CT HEAD FINDINGS Brain: No evidence of acute infarction, hemorrhage,  hydrocephalus, extra-axial collection or mass lesion/mass effect. Mild chronic white matter ischemic changes are seen. Vascular: No hyperdense vessel or unexpected calcification. Skull: Normal. Negative for fracture or focal lesion. Sinuses/Orbits: Orbits and their contents are within normal limits. Fluid is noted within the frontal sinus and portions of the ethmoid sinus is stable from the prior exam. Other: None. CT CERVICAL SPINE FINDINGS Alignment: Mild straightening of the normal cervical lordosis is noted. This may be related to muscular spasm. Skull base and vertebrae: 7 cervical segments are well visualized. Vertebral body height is well maintained. Osteophytic changes are noted particularly at C5-6. Facet hypertrophic changes are noted. No acute fracture or acute facet abnormality is seen. The odontoid is within normal limits. Soft tissues and spinal canal: Surrounding soft tissue structures show stable asymmetric enlargement of the right lobe of the thyroid. No acute soft tissue abnormality is noted. Upper chest: Visualized lung apices are within normal limits. Other: None IMPRESSION: CT of the head: No acute intracranial abnormality noted. Chronic white matter ischemic change is seen. Degenerative change of the cervical spine without acute abnormality. Stable asymmetric enlargement of the right lobe of the thyroid. Electronically Signed   By: Inez Catalina M.D.   On: 11/25/2022 23:41   CT Cervical Spine Wo Contrast  Result Date: 11/25/2022 CLINICAL DATA:  Recent fall with headaches and neck pain, initial encounter EXAM: CT HEAD WITHOUT CONTRAST CT CERVICAL SPINE WITHOUT CONTRAST TECHNIQUE: Multidetector CT imaging of the head and cervical spine was performed following the standard protocol without intravenous contrast. Multiplanar CT image reconstructions of the cervical spine were also generated. RADIATION DOSE REDUCTION: This exam was performed according to the departmental dose-optimization program  which includes automated exposure control, adjustment of the mA and/or kV according to patient size and/or use of iterative reconstruction technique. COMPARISON:  None Available. FINDINGS: CT HEAD FINDINGS Brain: No evidence of acute infarction, hemorrhage, hydrocephalus, extra-axial collection or mass lesion/mass effect. Mild chronic white matter ischemic changes  are seen. Vascular: No hyperdense vessel or unexpected calcification. Skull: Normal. Negative for fracture or focal lesion. Sinuses/Orbits: Orbits and their contents are within normal limits. Fluid is noted within the frontal sinus and portions of the ethmoid sinus is stable from the prior exam. Other: None. CT CERVICAL SPINE FINDINGS Alignment: Mild straightening of the normal cervical lordosis is noted. This may be related to muscular spasm. Skull base and vertebrae: 7 cervical segments are well visualized. Vertebral body height is well maintained. Osteophytic changes are noted particularly at C5-6. Facet hypertrophic changes are noted. No acute fracture or acute facet abnormality is seen. The odontoid is within normal limits. Soft tissues and spinal canal: Surrounding soft tissue structures show stable asymmetric enlargement of the right lobe of the thyroid. No acute soft tissue abnormality is noted. Upper chest: Visualized lung apices are within normal limits. Other: None IMPRESSION: CT of the head: No acute intracranial abnormality noted. Chronic white matter ischemic change is seen. Degenerative change of the cervical spine without acute abnormality. Stable asymmetric enlargement of the right lobe of the thyroid. Electronically Signed   By: Inez Catalina M.D.   On: 11/25/2022 23:41    Procedures Procedures    Medications Ordered in ED Medications  acetaminophen (TYLENOL) tablet 650 mg (has no administration in time range)    ED Course/ Medical Decision Making/ A&P                             Medical Decision Making Amount and/or  Complexity of Data Reviewed Labs: ordered. Radiology: ordered.  Risk OTC drugs. Prescription drug management.   This patient complains of headache; this involves an extensive number of treatment Options and is a complaint that carries with it a high risk of complications and morbidity. The differential includes posttraumatic headache, bleed, stroke, infection, dehydration  I ordered, reviewed and interpreted labs, which included CBC with normal white count low stable hemoglobin, chemistries with mild elevated creatinine, COVID-positive I ordered medication oral Tylenol and reviewed PMP when indicated. I ordered imaging studies which included head CT and I independently    visualized and interpreted imaging which showed no acute findings Additional history obtained from EMS Previous records obtained and reviewed in epic including recent ED visit I consulted pharmacy to discuss medical interactions with Paxlovid and discussed lab and imaging findings and discussed disposition.  Cardiac monitoring reviewed, normal sinus rhythm Social determinants considered, no significant barriers Critical Interventions: None  After the interventions stated above, I reevaluated the patient and found patient to be well-appearing in no distress.  She is incidental COVID-positive no hypoxia or respiratory distress Admission and further testing considered, no indications for admission or further workup at this time.  Will send back to facility with prescription for Paxlovid at renal dosing.  Return instructions discussed         Final Clinical Impression(s) / ED Diagnoses Final diagnoses:  Acute post-traumatic headache, not intractable  COVID-19 virus infection    Rx / DC Orders ED Discharge Orders          Ordered    nirmatrelvir & ritonavir (PAXLOVID, 300/100,) 20 x 150 MG & 10 x 100MG TBPK  2 times daily,   Status:  Discontinued        11/27/22 1537    nirmatrelvir & ritonavir (PAXLOVID,  150/100,) 10 x 150 MG & 10 x 100MG TBPK  2 times daily        11/27/22 1538  Hayden Rasmussen, MD 11/27/22 [phone]

## 2022-11-27 NOTE — Discharge Instructions (Signed)
You were seen in the emergency department for continued headache.  You had a repeat CAT scan that did not show any changes.  You did test positive for COVID.  This may also explain your headache.  We are prescribing you Paxlovid for 5 days.  You should also isolate and wear a mask.  Follow-up with your regular doctor.  Return to the emergency department if any worsening or concerning symptoms

## 2022-11-27 NOTE — ED Notes (Signed)
Report called to the [facility]

## 2023-01-21 NOTE — Progress Notes (Deleted)
  Cardiology Office Note:   Date:  01/21/2023  ID:  [name], DOB 1934-04-08, MRN [id number]  History of Present Illness:   [name] is a 89 y.o. female with a h/o of complete heart block sp ICD (MDT) in [place] by Dr Isabell Jarvis for nonischemic CM 09/2012.  She had generator replacement by Dr. Johney Frame in Feb 2023.   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 I saw her last in the hospital in 2021.  ***   *** She has not had device therapy (primary prevention).  She has complete heart block and is dependant today.  She walks with a rolling walker and is in an assisted living facility.  She is not very active.  She has SOB with minimal activity.  Today, she  denies symptoms of palpitations, chest pain, dizziness, presyncope, syncope, or neurologic sequela.  The patientis tolerating medications without difficulties and is otherwise without complaint today.     ROS: ***  Studies Reviewed:    EKG:  ***  ***  Risk Assessment/Calculations:   {Does this patient have ATRIAL FIBRILLATION?[phone]} No BP recorded.  {Refresh Note OR Click here to enter BP  :1}***        Physical Exam:   VS:  There were no vitals taken for this visit.   Wt Readings from Last 3 Encounters:  11/27/22 190 lb (86.2 kg)  11/25/22 190 lb 0.6 oz (86.2 kg)  02/10/22 190 lb 0.6 oz (86.2 kg)     GEN: Well nourished, well developed in no acute distress NECK: No JVD; No carotid bruits CARDIAC: ***RRR, no murmurs, rubs, gallops RESPIRATORY:  Clear to auscultation without rales, wheezing or rhonchi  ABDOMEN: Soft, non-tender, non-distended EXTREMITIES:  No edema; No deformity   ASSESSMENT AND PLAN:   Chronic combined systolic and diastolic CHF (congestive heart failure) (HCC):   ***  I think she may still be volume overloaded- check BMP and BNP -increase Lasix pending these results.    CKD (chronic kidney disease) stage 3:   ***  GFR 30-59 ml/min Her baseline SCr may be closer to 2.0, will see what her labs look like today   NICM (nonischemic cardiomyopathy) (HCC):  Ef on echo 2023 was 45 - 50%.  ***  EF 40-45% by echo April 2021 Normal coronaries 2016- aberrant takeoff of the    ICD :  *** in place MDT device implanted in [place] 2013      {Are you ordering a CV Procedure (e.g. stress test, cath, DCCV, TEE, etc)?   Press F2        :[id number]}   Signed, Rollene Rotunda, MD

## 2023-01-22 ENCOUNTER — Ambulatory Visit: Payer: Medicare HMO | Admitting: Cardiology

## 2023-01-22 DIAGNOSIS — N1832 Chronic kidney disease, stage 3b: Secondary | ICD-10-CM

## 2023-01-22 DIAGNOSIS — I5042 Chronic combined systolic (congestive) and diastolic (congestive) heart failure: Secondary | ICD-10-CM

## 2023-02-25 NOTE — Progress Notes (Deleted)
  Cardiology Office Note:   Date:  02/25/2023  ID:  [name], DOB Jan 07, 1934, MRN [id number]  History of Present Illness:   [name] is a 89 y.o. female with a h/o complete heart block sp ICD (MDT) in [place] by Dr Isabell Jarvis for nonischemic CM 09/2012 who has her first appt with me.  She Saw Dr. Johney Frame in Feb 2023  for generator replacement.  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 She has not had device therapy (primary prevention).  She has complete heart block and is pacer dependant today.     ***She walks with a rolling walker and is in an assisted living facility.  She is not very active.  She has SOB with minimal activity.  Today, she  denies symptoms of palpitations, chest pain, dizziness, presyncope, syncope, or neurologic sequela.  The patientis tolerating medications without difficulties and is otherwise without complaint today.    ROS: ***  Studies Reviewed:    EKG:  ***  ***  Risk Assessment/Calculations:   {Does this patient have ATRIAL FIBRILLATION?[phone]} No BP recorded.  {Refresh Note OR Click here to enter BP  :1}***        Physical Exam:   VS:  There were no vitals taken for this visit.   Wt Readings from Last 3 Encounters:  11/27/22 190 lb (86.2 kg)  11/25/22 190 lb 0.6 oz (86.2 kg)  02/10/22 190 lb 0.6 oz (86.2 kg)     GEN: Well nourished, well developed in no acute distress NECK: No JVD; No carotid bruits CARDIAC: ***RRR, no murmurs, rubs, gallops RESPIRATORY:  Clear to auscultation without rales, wheezing or rhonchi  ABDOMEN: Soft, non-tender, non-distended EXTREMITIES:  No edema; No deformity   ASSESSMENT AND PLAN:   Chronic combined systolic and diastolic CHF (congestive heart failure) (HCC):  ***  I think she may still be volume overloaded- check BMP and BNP -increase Lasix pending these results.    CKD (chronic kidney disease) stage 3:  ***    NICM (nonischemic cardiomyopathy) (HCC):  ***   EF 40-45% by echo April 2021 Normal coronaries 2016- aberrant takeoff of the    ICD in place:  ***    CHB:  ***  MDT device implanted in [place] 2013      {Are you ordering a CV Procedure (e.g. stress test, cath, DCCV, TEE, etc)?   Press F2        :[id number]}   Signed, Rollene Rotunda, MD

## 2023-02-27 ENCOUNTER — Ambulatory Visit: Payer: Medicare HMO | Admitting: Cardiology

## 2023-02-27 DIAGNOSIS — I428 Other cardiomyopathies: Secondary | ICD-10-CM

## 2023-02-27 DIAGNOSIS — Z95810 Presence of automatic (implantable) cardiac defibrillator: Secondary | ICD-10-CM

## 2023-03-06 ENCOUNTER — Ambulatory Visit (INDEPENDENT_AMBULATORY_CARE_PROVIDER_SITE_OTHER): Payer: Medicare HMO

## 2023-03-06 DIAGNOSIS — I428 Other cardiomyopathies: Secondary | ICD-10-CM | POA: Diagnosis not present

## 2023-03-07 LAB — CUP PACEART REMOTE DEVICE CHECK
Battery Remaining Longevity: 94 mo
Battery Voltage: 3.01 V
Brady Statistic AP VP Percent: 0.65 %
Brady Statistic AP VS Percent: 0 %
Brady Statistic AS VP Percent: 98.86 %
Brady Statistic AS VS Percent: 0.49 %
Brady Statistic RA Percent Paced: 0.65 %
Brady Statistic RV Percent Paced: 99.28 %
Date Time Interrogation Session: 20240529092755
HighPow Impedance: 56 Ohm
Implantable Lead Connection Status: 753985
Implantable Lead Connection Status: 753985
Implantable Lead Implant Date: 20131216
Implantable Lead Implant Date: 20131216
Implantable Lead Location: 753859
Implantable Lead Location: 753860
Implantable Lead Model: 5076
Implantable Pulse Generator Implant Date: 20230224
Lead Channel Impedance Value: 285 Ohm
Lead Channel Impedance Value: 342 Ohm
Lead Channel Impedance Value: 361 Ohm
Lead Channel Pacing Threshold Amplitude: 0.5 V
Lead Channel Pacing Threshold Amplitude: 0.875 V
Lead Channel Pacing Threshold Pulse Width: 0.4 ms
Lead Channel Pacing Threshold Pulse Width: 0.4 ms
Lead Channel Sensing Intrinsic Amplitude: 1.125 mV
Lead Channel Sensing Intrinsic Amplitude: 1.125 mV
Lead Channel Sensing Intrinsic Amplitude: 2.25 mV
Lead Channel Setting Pacing Amplitude: 1.75 V
Lead Channel Setting Pacing Amplitude: 2 V
Lead Channel Setting Pacing Pulse Width: 0.4 ms
Lead Channel Setting Sensing Sensitivity: 0.3 mV
Zone Setting Status: 755011

## 2023-03-30 NOTE — Progress Notes (Signed)
Remote ICD transmission.   

## 2023-04-05 NOTE — Progress Notes (Deleted)
  Cardiology Office Note:   Date:  04/05/2023  ID:  [name], DOB 08-10-1934, MRN [id number] PCP: System, Provider Not In  [facility] Providers Cardiologist:  Rollene Rotunda, MD Electrophysiologist:  Hillis Range, MD (Inactive) {  History of Present Illness:   [name] is a 89 y.o. female with a h/o complete heart block sp ICD (MDT) in [place] by Dr Isabell Jarvis for nonischemic CM 09/2012.  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 She presents for follow up.   She has a history of EF 45 - 50%.  Her last echo was 10/2021.  She has severe LVH.  She has severe mitral stenosis.  ***   ***  has not had device therapy (primary prevention).  She has complete heart block and is dependant today.  She walks with a rolling walker and is in an assisted living facility.  She is not very active.  She has SOB with minimal activity.  Today, she  denies symptoms of palpitations, chest pain, dizziness, presyncope, syncope, or neurologic sequela.  The patientis tolerating medications without difficulties and is otherwise without complaint today.   ROS: ***  Studies Reviewed:    EKG:       ***  Risk Assessment/Calculations:   {Does this patient have ATRIAL FIBRILLATION?[phone]} No BP recorded.  {Refresh Note OR Click here to enter BP  :1}***        Physical Exam:   VS:  There were no vitals taken for this visit.   Wt Readings from Last 3 Encounters:  11/27/22 190 lb (86.2 kg)  11/25/22 190 lb 0.6 oz (86.2 kg)  02/10/22 190 lb 0.6 oz (86.2 kg)     GEN: Well nourished, well developed in no acute distress NECK: No JVD; No carotid bruits CARDIAC: ***RRR, no murmurs, rubs, gallops RESPIRATORY:  Clear to auscultation without rales, wheezing or rhonchi  ABDOMEN: Soft, non-tender, non-distended EXTREMITIES:  No edema; No deformity   ASSESSMENT AND PLAN:   Chronic systolic HF:  ***   CHB:  ***    HTN:  ***   Mitral stenosis:  ***   ICD:  Her device was interrogated on Mar 07, 2023.   Her thoracic impedence has been falling recently.  ***     {Are you ordering a CV Procedure (e.g. stress test, cath, DCCV, TEE, etc)?   Press F2        :[id number]}  Follow up ***  Signed, Rollene Rotunda, MD

## 2023-04-06 ENCOUNTER — Ambulatory Visit: Payer: Medicare HMO | Admitting: Cardiology

## 2023-04-06 DIAGNOSIS — I342 Nonrheumatic mitral (valve) stenosis: Secondary | ICD-10-CM

## 2023-04-06 DIAGNOSIS — I5042 Chronic combined systolic (congestive) and diastolic (congestive) heart failure: Secondary | ICD-10-CM

## 2023-04-06 DIAGNOSIS — Z95810 Presence of automatic (implantable) cardiac defibrillator: Secondary | ICD-10-CM

## 2023-04-19 ENCOUNTER — Encounter: Payer: Self-pay | Admitting: Physician Assistant

## 2023-04-19 ENCOUNTER — Ambulatory Visit: Payer: Medicare HMO | Attending: Cardiology | Admitting: Physician Assistant

## 2023-04-19 VITALS — BP 128/84 | HR 78 | Ht 66.0 in | Wt 156.0 lb

## 2023-04-19 DIAGNOSIS — J449 Chronic obstructive pulmonary disease, unspecified: Secondary | ICD-10-CM

## 2023-04-19 DIAGNOSIS — I428 Other cardiomyopathies: Secondary | ICD-10-CM

## 2023-04-19 DIAGNOSIS — Z95810 Presence of automatic (implantable) cardiac defibrillator: Secondary | ICD-10-CM | POA: Diagnosis not present

## 2023-04-19 DIAGNOSIS — I10 Essential (primary) hypertension: Secondary | ICD-10-CM

## 2023-04-19 DIAGNOSIS — E119 Type 2 diabetes mellitus without complications: Secondary | ICD-10-CM | POA: Diagnosis not present

## 2023-04-19 NOTE — Progress Notes (Signed)
Cardiology Office Note:  .   Date:  04/21/2023  ID:  [name], DOB 1934/06/23, MRN [id number] PCP: System, Provider Not In  [facility] Providers Cardiologist:  Rollene Rotunda, MD Electrophysiologist:  Hillis Range, MD (Inactive) { - will set up with Dr. Lalla Brothers    History of Present Illness: .   [name] is a 89 y.o. female with PMH of COPD, hypertension, hyperlipidemia, DM2, hypothyroidism, PE, CHB and NICM s/p Medtronic dual-chamber ICD (initial one placed 09/2012, with device change out to a Medtronic dual-chamber ICD 12/02/2021).  Echocardiogram obtained on 03/21/2015 demonstrated EF 35 to 40%, small to moderate posterior pericardial effusion.  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  Repeat echocardiogram in February 2017 showed EF 35 to 40%.  Echocardiogram in July 2018 showed EF improved to 40 to 45%, moderate diffuse hypokinesis, mild to moderate AI, moderate mitral stenosis, PA peak pressure 65 mmHg.  Last echocardiogram obtained on 02/03/2020 showed EF 40 to 45%, global hypokinesis, mild LVH, grade 3 DD moderate LAE, mild MR.  Since generator change out in February 2023, it does not appears patient has been followed by EP service again.  More recently, patient was seen in the [facility], ED in February 2024 with head trauma after fall.  Head CT was negative.  She returned back to the ED 2 days later with possible low blood pressure.  Repeat CT of the head showed no acute intracranial abnormality.  CBC was 10.4 stable.  Creatinine is 1.56 also stable.  She was tested positive for COVID infection.  She did not have a device interrogation between March 2023 until May 2024.  Most recent device interrogation performed on 03/07/2023 showed normal device function.  Patient currently  resides in [facility] since May 2024.  She was in a different facility prior to that, this likely explains why she did not have any remote ICD interrogation report from March 2023 until May 2024.  Her facility does have a remote transmission device and will continue to do remote transmission from now on.  I recommend she follow-up with Dr. Lalla Brothers in 3 to 68-month to establish.  She can follow-up with Dr. Antoine Poche in 1 year.  She has been doing well overall without any exertional chest pain or worsening dyspnea.  She has no significant lower extremity edema on physical exam.  She has markedly diminished breath sound consistent with prior history of COPD.  She does have bibasilar crackles, I do not think she is volume overloaded on exam, I suspect bibasilar crackles is more related to atelectasis.  She can follow-up follow-up with Dr. Antoine Poche in 1 year.  Note, her medication list has been updated.  She is not on Lasix, she is on torsemide 40 mg daily.  She is also on lower dose of hydralazine 25 mg 3 times a day rather than 50 mg 3 times daily.  ROS:   She denies chest pain, palpitations, dyspnea, pnd, orthopnea, n, v, dizziness, syncope, edema, weight gain, or early satiety. All other systems reviewed and are otherwise negative except as noted above.    Studies Reviewed: .        Cardiac Studies & Procedures   CARDIAC CATHETERIZATION  CARDIAC CATHETERIZATION 03/22/2015  Narrative Images from the original result were not included. 1. 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 Anomalous codominant RCA 3. Moderate to severely reduced EF by LV gram consistent with echocardiogram. Despite this the cardiac output/index is only mild to moderately reduced. (Low normal by Fick and mildly to moderately reduced by Thermodilution) 4. Moderate to severely elevated LVEDP with only mildly elevated pulmonary arterial capillary  wedge pressure of 15 mmHg  No angiographic data for culprit lesion to explain the patient's reduced EF and acute exacerbation.  Recommendations:  Standard post femoral arterial and vein catheterization care.  Continue diuresis and heart failure management per primary service.  Evaluate cause of anemia. Significant hemoglobin drop while on IV heparin which suggest a bleeding source.   Marykay Lex, M.D., M.S. Interventional Cardiologist  Pager # [phone]  Findings Coronary Findings Diagnostic  Dominance: Co-dominant  Left Main The vessel , is large . Long downward takeoff.  Left Anterior Descending diffuse .  First Diagonal Branch The vessel is moderate in size.  Lateral First Diagonal Branch The vessel is small in size.  First Septal Branch The vessel is small in size.  Second Septal Branch The vessel is small in size.  Third Septal Branch The vessel is small in size.  Left Circumflex  First Obtuse Marginal Branch The vessel is moderate in size.  Fourth Obtuse Marginal Branch The vessel is small in size.  Right Coronary Artery The vessel , is moderate in size . The vessel originates from the left coronary cusp and . The anomalous RCA is a moderate caliber vessel tapering to a small caliber vessel distally. The PDA is a very small caliber vessel.  First Right Posterolateral Branch The vessel is small in size.  Intervention  No interventions have been documented.     ECHOCARDIOGRAM  ECHOCARDIOGRAM COMPLETE 10/31/2021  Narrative ECHOCARDIOGRAM REPORT    Patient Name:   [name] Date of Exam: 10/31/2021 Medical Rec #:  [id number]     Height:       66.0 in Accession #:    [id number]    Weight:       193.3 lb Date of Birth:  04-Sep-1934     BSA:          1.971 m Patient Age:    87 years      BP:           143/72 mmHg Patient Gender: F             HR:           96 bpm. [facility]  Procedure: 2D Echo, Color Doppler,  Cardiac Doppler and Intracardiac Opacification Agent  Indications:    Congestive heart failure  History:        Patient has prior history of Echocardiogram examinations, most recent 02/03/2020. CHF and Cardiomyopathy; Defibrillator.  Sonographer:    Mikki Harbor Referring Phys: [id number] ASIA B ZIERLE-GHOSH  IMPRESSIONS   1. Left ventricular ejection fraction, by estimation, is 45 to 50%. The left ventricle has mildly decreased function. The left ventricle demonstrates global hypokinesis. There is severe concentric left ventricular hypertrophy. Left ventricular diastolic function could not be evaluated. Elevated left ventricular end-diastolic pressure. 2. Right ventricular systolic function is mildly reduced. The right ventricular size is mildly enlarged. 3. Left atrial size was severely dilated. 4. Right atrial size was moderately dilated. 5. There is heavy calcification of the medial aspect of the mitral valve annulus.. The mitral valve is degenerative. Mild mitral valve regurgitation. Severe  mitral stenosis. Severe mitral annular calcification. 6. Tricuspid valve regurgitation is mild to moderate. 7. The aortic valve is calcified. There is severe calcifcation of the aortic valve. Aortic valve regurgitation is moderate. Mild aortic valve stenosis. Aortic valve area, by VTI measures 1.88 cm. Aortic valve mean gradient measures 7.0 mmHg. Aortic valve Vmax measures 1.89 m/s. 8. Aortic dilatation noted. There is mild dilatation of the ascending aorta, measuring 39 mm.  FINDINGS Left Ventricle: Left ventricular ejection fraction, by estimation, is 45 to 50%. The left ventricle has mildly decreased function. The left ventricle demonstrates global hypokinesis. The left ventricular internal cavity size was normal in size. There is severe concentric left ventricular hypertrophy. Abnormal (paradoxical) septal motion, consistent with left bundle branch block. Left ventricular diastolic function  could not be evaluated. Elevated left ventricular end-diastolic pressure.  Right Ventricle: The right ventricular size is mildly enlarged. No increase in right ventricular wall thickness. Right ventricular systolic function is mildly reduced.  Left Atrium: Left atrial size was severely dilated.  Right Atrium: Right atrial size was moderately dilated.  Pericardium: There is no evidence of pericardial effusion.  Mitral Valve: There is heavy calcification of the medial aspect of the mitral valve annulus. The mitral valve is degenerative in appearance. There is mild thickening of the mitral valve leaflet(s). There is mild calcification of the mitral valve leaflet(s). Severe mitral annular calcification. Mild mitral valve regurgitation. Severe mitral valve stenosis. MV peak gradient, 28.9 mmHg. The mean mitral valve gradient is 11.0 mmHg.  Tricuspid Valve: The tricuspid valve is normal in structure. Tricuspid valve regurgitation is mild to moderate. No evidence of tricuspid stenosis.  Aortic Valve: The aortic valve is calcified. There is severe calcifcation of the aortic valve. Aortic valve regurgitation is moderate. Aortic regurgitation PHT measures 363 msec. Mild aortic stenosis is present. Aortic valve mean gradient measures 7.0 mmHg. Aortic valve peak gradient measures 14.3 mmHg. Aortic valve area, by VTI measures 1.88 cm.  Pulmonic Valve: The pulmonic valve was normal in structure. Pulmonic valve regurgitation is mild. No evidence of pulmonic stenosis.  Aorta: Aortic dilatation noted. There is mild dilatation of the ascending aorta, measuring 39 mm.  Venous: The inferior vena cava was not well visualized.  IAS/Shunts: No atrial level shunt detected by color flow Doppler.  Additional Comments: A device lead is visualized.   LEFT VENTRICLE PLAX 2D LVIDd:         3.90 cm   Diastology LVIDs:         2.90 cm   LV e' medial:    6.42 cm/s LV PW:         1.60 cm   LV E/e' medial:  38.2 LV  IVS:        1.80 cm   LV e' lateral:   8.92 cm/s LVOT diam:     1.80 cm   LV E/e' lateral: 27.5 LV SV:         62 LV SV Index:   31 LVOT Area:     2.54 cm   RIGHT VENTRICLE RV Basal diam:  3.85 cm RV Mid diam:    3.70 cm  LEFT ATRIUM              Index        RIGHT ATRIUM           Index LA diam:        4.10 cm  2.08 cm/m   RA Area:     20.60 cm LA Vol (  A2C):   156.0 ml 79.14 ml/m  RA Volume:   70.10 ml  35.56 ml/m LA Vol (A4C):   119.0 ml 60.37 ml/m LA Biplane Vol: 148.0 ml 75.08 ml/m AORTIC VALVE                     PULMONIC VALVE AV Area (Vmax):    1.75 cm      PV Vmax:       0.63 m/s AV Area (Vmean):   1.81 cm      PV Peak grad:  1.6 mmHg AV Area (VTI):     1.88 cm AV Vmax:           189.00 cm/s AV Vmean:          115.000 cm/s AV VTI:            0.331 m AV Peak Grad:      14.3 mmHg AV Mean Grad:      7.0 mmHg LVOT Vmax:         130.00 cm/s LVOT Vmean:        82.000 cm/s LVOT VTI:          0.244 m LVOT/AV VTI ratio: 0.74 AI PHT:            363 msec  AORTA Ao Root diam: 3.40 cm Ao Asc diam:  3.90 cm  MITRAL VALVE                TRICUSPID VALVE MV Area (PHT): 5.31 cm     TR Peak grad:   42.0 mmHg MV Area VTI:   1.68 cm     TR Vmax:        324.00 cm/s MV Peak grad:  28.9 mmHg MV Mean grad:  11.0 mmHg    SHUNTS MV Vmax:       2.69 m/s     Systemic VTI:  0.24 m MV Vmean:      140.0 cm/s   Systemic Diam: 1.80 cm MV Decel Time: 143 msec MV E velocity: 245.00 cm/s  Armanda Magic MD Electronically signed by Armanda Magic MD Signature Date/Time: 10/31/2021/11:50:25 AM    Final             Risk Assessment/Calculations:            Physical Exam:   VS:  BP 128/84   Pulse 78   Ht 5\' 6"  (1.676 m)   Wt 156 lb (70.8 kg) Comment: pt. reports  SpO2 99%   BMI 25.18 kg/m    Wt Readings from Last 3 Encounters:  04/19/23 156 lb (70.8 kg)  11/27/22 190 lb (86.2 kg)  11/25/22 190 lb 0.6 oz (86.2 kg)    GEN: Well nourished, well developed in no acute  distress NECK: No JVD; No carotid bruits CARDIAC: RRR, no murmurs, rubs, gallops RESPIRATORY: Diffusely diminished breath sound bilaterally ABDOMEN: Soft, non-tender, non-distended EXTREMITIES:  No edema; No deformity   ASSESSMENT AND PLAN: .    Nonischemic cardiomyopathy: Euvolemic on physical exam.  Most recent echocardiogram obtained in January 2023 showed EF 45 to 50%.  Unchanged when compared to the previous echocardiogram.  History of ICD: Most recent device interrogation was in May 2024.  Will set him up to follow-up with Dr. Lalla Brothers as his previous electrophysiologist Dr. Johney Frame has retired.  Hypertension: Blood pressure stable  DM 2: Managed by primary care provider  COPD: Diffusely diminished breath sound bilaterally on physical exam.  No acute exacerbation.  Dispo: Establish with Dr. Lalla Brothers in 3 to 4 months  Signed, Azalee Course, PA

## 2023-04-19 NOTE — Patient Instructions (Signed)
Medication Instructions:  Your physician recommends that you continue on your current medications as directed. Please refer to the Current Medication list given to you today.  *If you need a refill on your cardiac medications before your next appointment, please call your pharmacy*   Lab Work: None ordered  If you have labs (blood work) drawn today and your tests are completely normal, you will receive your results only by: MyChart Message (if you have MyChart) OR A paper copy in the mail If you have any lab test that is abnormal or we need to change your treatment, we will call you to review the results.   Testing/Procedures: None ordered   Follow-Up: At [facility], you and your health needs are our priority.  As part of our continuing mission to provide you with exceptional heart care, we have created designated Provider Care Teams.  These Care Teams include your primary Cardiologist (physician) and Advanced Practice Providers (APPs -  Physician Assistants and Nurse Practitioners) who all work together to provide you with the care you need, when you need it.  We recommend signing up for the patient portal called "MyChart".  Sign up information is provided on this After Visit Summary.  MyChart is used to connect with patients for Virtual Visits (Telemedicine).  Patients are able to view lab/test results, encounter notes, upcoming appointments, etc.  Non-urgent messages can be sent to your provider as well.   To learn more about what you can do with MyChart, go to [email or web address].    Your next appointment:   3-4 MONTHS WITH DR. Lalla Brothers ([facility] PT)  1 YEAR WITH DR. HOCHREIN  Provider:   Rollene Rotunda, MD     Other Instructions

## 2023-06-05 ENCOUNTER — Ambulatory Visit (INDEPENDENT_AMBULATORY_CARE_PROVIDER_SITE_OTHER): Payer: Medicare HMO

## 2023-06-05 DIAGNOSIS — I428 Other cardiomyopathies: Secondary | ICD-10-CM | POA: Diagnosis not present

## 2023-06-05 LAB — CUP PACEART REMOTE DEVICE CHECK
Battery Remaining Longevity: 93 mo
Battery Voltage: 3.01 V
Brady Statistic AP VP Percent: 0.06 %
Brady Statistic AP VS Percent: 0 %
Brady Statistic AS VP Percent: 99.94 %
Brady Statistic AS VS Percent: 0 %
Brady Statistic RA Percent Paced: 0.06 %
Brady Statistic RV Percent Paced: 100 %
Date Time Interrogation Session: 20240827002204
HighPow Impedance: 62 Ohm
Implantable Lead Connection Status: 753985
Implantable Lead Connection Status: 753985
Implantable Lead Implant Date: 20131216
Implantable Lead Implant Date: 20131216
Implantable Lead Location: 753859
Implantable Lead Location: 753860
Implantable Lead Model: 5076
Implantable Pulse Generator Implant Date: 20230224
Lead Channel Impedance Value: 304 Ohm
Lead Channel Impedance Value: 361 Ohm
Lead Channel Impedance Value: 399 Ohm
Lead Channel Pacing Threshold Amplitude: 0.5 V
Lead Channel Pacing Threshold Amplitude: 0.75 V
Lead Channel Pacing Threshold Pulse Width: 0.4 ms
Lead Channel Pacing Threshold Pulse Width: 0.4 ms
Lead Channel Sensing Intrinsic Amplitude: 1.125 mV
Lead Channel Sensing Intrinsic Amplitude: 1.125 mV
Lead Channel Sensing Intrinsic Amplitude: 2.25 mV
Lead Channel Setting Pacing Amplitude: 1.5 V
Lead Channel Setting Pacing Amplitude: 2 V
Lead Channel Setting Pacing Pulse Width: 0.4 ms
Lead Channel Setting Sensing Sensitivity: 0.3 mV
Zone Setting Status: 755011

## 2023-06-18 NOTE — Progress Notes (Signed)
Remote ICD transmission.   

## 2023-07-25 ENCOUNTER — Ambulatory Visit: Payer: Medicare HMO | Attending: Student | Admitting: Student

## 2023-07-25 ENCOUNTER — Encounter: Payer: Self-pay | Admitting: Student

## 2023-07-25 VITALS — BP 128/58 | HR 82 | Ht 66.0 in | Wt 181.2 lb

## 2023-07-25 DIAGNOSIS — I428 Other cardiomyopathies: Secondary | ICD-10-CM | POA: Diagnosis not present

## 2023-07-25 DIAGNOSIS — Z95810 Presence of automatic (implantable) cardiac defibrillator: Secondary | ICD-10-CM

## 2023-07-25 DIAGNOSIS — I10 Essential (primary) hypertension: Secondary | ICD-10-CM | POA: Diagnosis not present

## 2023-07-25 LAB — CUP PACEART INCLINIC DEVICE CHECK
Battery Remaining Longevity: 92 mo
Battery Voltage: 3.01 V
Brady Statistic AP VP Percent: 0.51 %
Brady Statistic AP VS Percent: 0 %
Brady Statistic AS VP Percent: 99.11 %
Brady Statistic AS VS Percent: 0.37 %
Brady Statistic RA Percent Paced: 0.51 %
Brady Statistic RV Percent Paced: 99.45 %
Date Time Interrogation Session: 20241016102309
HighPow Impedance: 62 Ohm
Implantable Lead Connection Status: 753985
Implantable Lead Connection Status: 753985
Implantable Lead Implant Date: 20131216
Implantable Lead Implant Date: 20131216
Implantable Lead Location: 753859
Implantable Lead Location: 753860
Implantable Lead Model: 5076
Implantable Pulse Generator Implant Date: 20230224
Lead Channel Impedance Value: 342 Ohm
Lead Channel Impedance Value: 361 Ohm
Lead Channel Impedance Value: 418 Ohm
Lead Channel Pacing Threshold Amplitude: 0.5 V
Lead Channel Pacing Threshold Amplitude: 0.75 V
Lead Channel Pacing Threshold Pulse Width: 0.4 ms
Lead Channel Pacing Threshold Pulse Width: 0.4 ms
Lead Channel Sensing Intrinsic Amplitude: 1.25 mV
Lead Channel Sensing Intrinsic Amplitude: 1.375 mV
Lead Channel Sensing Intrinsic Amplitude: 2.25 mV
Lead Channel Setting Pacing Amplitude: 1.5 V
Lead Channel Setting Pacing Amplitude: 2 V
Lead Channel Setting Pacing Pulse Width: 0.4 ms
Lead Channel Setting Sensing Sensitivity: 0.3 mV
Zone Setting Status: 755011

## 2023-07-25 NOTE — Progress Notes (Signed)
Electrophysiology Office Note:   ID:  [name], [name] 07-Apr-1934, MRN [id number]  Primary Cardiologist: Rollene Rotunda, MD Electrophysiologist: Lanier Prude, MD      History of Present Illness:   [name] is a 89 y.o. female with h/o NICM, CHB, DM2, HLD, HTN, and hypothryodisim seen today for routine electrophysiology followup.   Since last being seen in our clinic the patient reports doing well overall.  she denies chest pain, palpitations, dyspnea, PND, orthopnea, nausea, vomiting, dizziness, syncope, edema, weight gain, or early satiety.   Review of systems complete and found to be negative unless listed in HPI.   EP Information / Studies Reviewed:    EKG is not ordered today. EKG from 04/19/2023 reviewed which showed AS-VP at 78 bpm       ICD Interrogation-  reviewed in detail today,  See PACEART report.  Device History: Medtronic Dual Chamber ICD implanted 09/2012, gen change 11/2021 for NICM   Echo 10/11/2021 LVEF 45-50%, mild RV dysfunction, severe LAE, Mod RAE. Severe MS, mild/mod TR  Physical Exam:   VS:  BP (!) 128/58   Pulse 82   Ht 5\' 6"  (1.676 m)   Wt 181 lb 3.2 oz (82.2 kg)   SpO2 95%   BMI 29.25 kg/m    Wt Readings from Last 3 Encounters:  07/25/23 181 lb 3.2 oz (82.2 kg)  04/19/23 156 lb (70.8 kg)  11/27/22 190 lb (86.2 kg)     GEN: Well nourished, well developed in no acute distress NECK: No JVD; No carotid bruits CARDIAC: Regular rate and rhythm, no murmurs, rubs, gallops RESPIRATORY:  Clear to auscultation without rales, wheezing or rhonchi  ABDOMEN: Soft, non-tender, non-distended EXTREMITIES:  No edema; No deformity   ASSESSMENT AND PLAN:    Chronic systolic dysfunction s/p Medtronic dual chamber ICD  euvolemic today Stable on an appropriate medical regimen Normal ICD function See Pace Art report No changes today Labs today with her GDMT.  Decision was made to NOT upgrade to CRT at time of gen change 11/2021, but ICD was  continued at pt preference.   CHB Stable device function as above  HTN Stable on current regimen   Disposition:   Follow up with Dr. Lalla Brothers in 12 months   Signed, Graciella Freer, PA-C

## 2023-07-25 NOTE — Patient Instructions (Signed)
Medication Instructions:  Your physician recommends that you continue on your current medications as directed. Please refer to the Current Medication list given to you today.  *If you need a refill on your cardiac medications before your next appointment, please call your pharmacy*  Lab Work: BMET, CBC--TODAY If you have labs (blood work) drawn today and your tests are completely normal, you will receive your results only by: MyChart Message (if you have MyChart) OR A paper copy in the mail If you have any lab test that is abnormal or we need to change your treatment, we will call you to review the results.  Follow-Up: At [facility], you and your health needs are our priority.  As part of our continuing mission to provide you with exceptional heart care, we have created designated Provider Care Teams.  These Care Teams include your primary Cardiologist (physician) and Advanced Practice Providers (APPs -  Physician Assistants and Nurse Practitioners) who all work together to provide you with the care you need, when you need it.  We recommend signing up for the patient portal called "MyChart".  Sign up information is provided on this After Visit Summary.  MyChart is used to connect with patients for Virtual Visits (Telemedicine).  Patients are able to view lab/test results, encounter notes, upcoming appointments, etc.  Non-urgent messages can be sent to your provider as well.   To learn more about what you can do with MyChart, go to [email or web address].    Your next appointment:   1 year(s)  Provider:   Steffanie Dunn, MD

## 2023-07-26 LAB — CBC
Hematocrit: 33 % — ABNORMAL LOW (ref 34.0–46.6)
Hemoglobin: 10.3 g/dL — ABNORMAL LOW (ref 11.1–15.9)
MCH: 30.7 pg (ref 26.6–33.0)
MCHC: 31.2 g/dL — ABNORMAL LOW (ref 31.5–35.7)
MCV: 98 fL — ABNORMAL HIGH (ref 79–97)
Platelets: 215 10*3/uL (ref 150–450)
RBC: 3.36 x10E6/uL — ABNORMAL LOW (ref 3.77–5.28)
RDW: 12.6 % (ref 11.7–15.4)
WBC: 7.9 10*3/uL (ref 3.4–10.8)

## 2023-07-26 LAB — BASIC METABOLIC PANEL WITH GFR
BUN/Creatinine Ratio: 36 — ABNORMAL HIGH (ref 12–28)
BUN: 62 mg/dL — ABNORMAL HIGH (ref 8–27)
CO2: 23 mmol/L (ref 20–29)
Calcium: 10 mg/dL (ref 8.7–10.3)
Chloride: 101 mmol/L (ref 96–106)
Creatinine, Ser: 1.71 mg/dL — ABNORMAL HIGH (ref 0.57–1.00)
Glucose: 84 mg/dL (ref 70–99)
Potassium: 4.9 mmol/L (ref 3.5–5.2)
Sodium: 140 mmol/L (ref 134–144)
eGFR: 29 mL/min/{1.73_m2} — ABNORMAL LOW
